# Patient Record
Sex: Female | Born: 1955
Health system: Southern US, Community
[De-identification: ages and names within clinical notes are randomized; demographics above are authoritative.]

## PROBLEM LIST (undated history)

## (undated) DIAGNOSIS — I10 Essential (primary) hypertension: Secondary | ICD-10-CM

## (undated) DIAGNOSIS — F419 Anxiety disorder, unspecified: Secondary | ICD-10-CM

## (undated) DIAGNOSIS — H269 Unspecified cataract: Secondary | ICD-10-CM

## (undated) DIAGNOSIS — IMO0001 Reserved for inherently not codable concepts without codable children: Secondary | ICD-10-CM

## (undated) DIAGNOSIS — K5190 Ulcerative colitis, unspecified, without complications: Secondary | ICD-10-CM

## (undated) DIAGNOSIS — R200 Anesthesia of skin: Secondary | ICD-10-CM

## (undated) DIAGNOSIS — R202 Paresthesia of skin: Secondary | ICD-10-CM

## (undated) DIAGNOSIS — T7840XA Allergy, unspecified, initial encounter: Secondary | ICD-10-CM

## (undated) DIAGNOSIS — E118 Type 2 diabetes mellitus with unspecified complications: Secondary | ICD-10-CM

## (undated) DIAGNOSIS — Z932 Ileostomy status: Secondary | ICD-10-CM

## (undated) DIAGNOSIS — K56609 Unspecified intestinal obstruction, unspecified as to partial versus complete obstruction: Secondary | ICD-10-CM

## (undated) DIAGNOSIS — N289 Disorder of kidney and ureter, unspecified: Secondary | ICD-10-CM

## (undated) DIAGNOSIS — I82409 Acute embolism and thrombosis of unspecified deep veins of unspecified lower extremity: Secondary | ICD-10-CM

## (undated) DIAGNOSIS — D649 Anemia, unspecified: Secondary | ICD-10-CM

## (undated) DIAGNOSIS — E785 Hyperlipidemia, unspecified: Secondary | ICD-10-CM

## (undated) DIAGNOSIS — M1990 Unspecified osteoarthritis, unspecified site: Secondary | ICD-10-CM

## (undated) DIAGNOSIS — Z5189 Encounter for other specified aftercare: Secondary | ICD-10-CM

## (undated) DIAGNOSIS — K635 Polyp of colon: Secondary | ICD-10-CM

## (undated) HISTORY — DX: Disorder of kidney and ureter, unspecified: N28.9

## (undated) HISTORY — DX: Unspecified cataract: H26.9

## (undated) HISTORY — PX: OTHER SURGICAL HISTORY: SHX169

## (undated) HISTORY — DX: Polyp of colon: K63.5

## (undated) HISTORY — DX: Anemia, unspecified: D64.9

## (undated) HISTORY — DX: Allergy, unspecified, initial encounter: T78.40XA

## (undated) HISTORY — DX: Hyperlipidemia, unspecified: E78.5

## (undated) HISTORY — PX: DILATION AND CURETTAGE OF UTERUS: SHX78

## (undated) HISTORY — DX: Ileostomy status: Z93.2

## (undated) HISTORY — PX: EYE SURGERY: SHX253

## (undated) HISTORY — DX: Essential (primary) hypertension: I10

## (undated) HISTORY — PX: BREAST CYST ASPIRATION: SHX578

## (undated) HISTORY — DX: Reserved for inherently not codable concepts without codable children: IMO0001

## (undated) HISTORY — PX: ABDOMINAL SURGERY: SHX537

## (undated) HISTORY — PX: COLON SURGERY: SHX602

## (undated) HISTORY — DX: Acute embolism and thrombosis of unspecified deep veins of unspecified lower extremity: I82.409

## (undated) HISTORY — PX: KNEE ARTHROSCOPY W/ MENISCAL REPAIR: SHX1877

## (undated) HISTORY — DX: Type 2 diabetes mellitus with unspecified complications: E11.8

## (undated) HISTORY — DX: Encounter for other specified aftercare: Z51.89

## (undated) HISTORY — DX: Unspecified osteoarthritis, unspecified site: M19.90

## (undated) HISTORY — DX: Other pulmonary embolism without acute cor pulmonale: I26.99

## (undated) HISTORY — DX: Unspecified intestinal obstruction, unspecified as to partial versus complete obstruction: K56.609

## (undated) HISTORY — DX: Ulcerative colitis, unspecified, without complications: K51.90

---

## 1994-02-06 ENCOUNTER — Encounter: Payer: Self-pay | Admitting: Internal Medicine

## 1998-11-09 HISTORY — PX: ABDOMINAL HYSTERECTOMY: SHX81

## 1999-02-18 ENCOUNTER — Other Ambulatory Visit: Admission: RE | Admit: 1999-02-18 | Discharge: 1999-02-18 | Payer: Self-pay | Admitting: Internal Medicine

## 2000-09-27 ENCOUNTER — Other Ambulatory Visit: Admission: RE | Admit: 2000-09-27 | Discharge: 2000-09-27 | Payer: Self-pay | Admitting: Obstetrics and Gynecology

## 2000-12-08 ENCOUNTER — Encounter (INDEPENDENT_AMBULATORY_CARE_PROVIDER_SITE_OTHER): Payer: Self-pay

## 2000-12-08 ENCOUNTER — Ambulatory Visit (HOSPITAL_COMMUNITY): Admission: RE | Admit: 2000-12-08 | Discharge: 2000-12-08 | Payer: Self-pay | Admitting: Obstetrics and Gynecology

## 2001-04-11 ENCOUNTER — Inpatient Hospital Stay (HOSPITAL_COMMUNITY): Admission: RE | Admit: 2001-04-11 | Discharge: 2001-04-14 | Payer: Self-pay | Admitting: Obstetrics and Gynecology

## 2001-04-11 ENCOUNTER — Encounter (INDEPENDENT_AMBULATORY_CARE_PROVIDER_SITE_OTHER): Payer: Self-pay | Admitting: Specialist

## 2001-09-05 ENCOUNTER — Encounter (INDEPENDENT_AMBULATORY_CARE_PROVIDER_SITE_OTHER): Payer: Self-pay

## 2001-09-05 ENCOUNTER — Other Ambulatory Visit: Admission: RE | Admit: 2001-09-05 | Discharge: 2001-09-05 | Payer: Self-pay | Admitting: Internal Medicine

## 2002-12-07 ENCOUNTER — Other Ambulatory Visit: Admission: RE | Admit: 2002-12-07 | Discharge: 2002-12-07 | Payer: Self-pay | Admitting: Obstetrics and Gynecology

## 2004-02-06 ENCOUNTER — Other Ambulatory Visit: Admission: RE | Admit: 2004-02-06 | Discharge: 2004-02-06 | Payer: Self-pay | Admitting: Obstetrics and Gynecology

## 2004-10-09 ENCOUNTER — Ambulatory Visit: Payer: Self-pay | Admitting: Internal Medicine

## 2004-10-13 ENCOUNTER — Encounter: Payer: Self-pay | Admitting: Internal Medicine

## 2004-10-13 ENCOUNTER — Ambulatory Visit (HOSPITAL_COMMUNITY): Admission: RE | Admit: 2004-10-13 | Discharge: 2004-10-13 | Payer: Self-pay | Admitting: Internal Medicine

## 2004-12-18 ENCOUNTER — Other Ambulatory Visit: Admission: RE | Admit: 2004-12-18 | Discharge: 2004-12-18 | Payer: Self-pay | Admitting: Family Medicine

## 2005-05-04 ENCOUNTER — Emergency Department: Payer: Self-pay | Admitting: Emergency Medicine

## 2005-05-04 ENCOUNTER — Other Ambulatory Visit: Payer: Self-pay

## 2006-04-08 ENCOUNTER — Ambulatory Visit: Payer: Self-pay | Admitting: Internal Medicine

## 2006-04-15 ENCOUNTER — Ambulatory Visit: Payer: Self-pay | Admitting: Internal Medicine

## 2006-05-06 ENCOUNTER — Ambulatory Visit (HOSPITAL_COMMUNITY): Admission: RE | Admit: 2006-05-06 | Discharge: 2006-05-06 | Payer: Self-pay | Admitting: Cardiology

## 2006-05-06 ENCOUNTER — Encounter: Payer: Self-pay | Admitting: Internal Medicine

## 2007-03-10 ENCOUNTER — Other Ambulatory Visit: Admission: RE | Admit: 2007-03-10 | Discharge: 2007-03-10 | Payer: Self-pay | Admitting: Family Medicine

## 2007-03-10 ENCOUNTER — Ambulatory Visit: Payer: Self-pay | Admitting: Family Medicine

## 2007-07-13 ENCOUNTER — Ambulatory Visit: Payer: Self-pay | Admitting: Internal Medicine

## 2007-08-08 ENCOUNTER — Ambulatory Visit: Payer: Self-pay | Admitting: Internal Medicine

## 2007-08-08 ENCOUNTER — Encounter: Payer: Self-pay | Admitting: Internal Medicine

## 2007-08-08 DIAGNOSIS — K5190 Ulcerative colitis, unspecified, without complications: Secondary | ICD-10-CM | POA: Insufficient documentation

## 2007-09-20 ENCOUNTER — Ambulatory Visit: Payer: Self-pay | Admitting: Internal Medicine

## 2007-09-21 ENCOUNTER — Encounter: Payer: Self-pay | Admitting: Internal Medicine

## 2007-12-30 DIAGNOSIS — I10 Essential (primary) hypertension: Secondary | ICD-10-CM

## 2008-06-05 ENCOUNTER — Encounter: Payer: Self-pay | Admitting: Internal Medicine

## 2008-08-01 ENCOUNTER — Other Ambulatory Visit: Admission: RE | Admit: 2008-08-01 | Discharge: 2008-08-01 | Payer: Self-pay | Admitting: Family Medicine

## 2008-08-01 ENCOUNTER — Encounter: Payer: Self-pay | Admitting: Family Medicine

## 2008-08-01 ENCOUNTER — Ambulatory Visit: Payer: Self-pay | Admitting: Family Medicine

## 2008-10-09 ENCOUNTER — Ambulatory Visit: Payer: Self-pay | Admitting: Family Medicine

## 2008-12-20 ENCOUNTER — Telehealth: Payer: Self-pay | Admitting: Internal Medicine

## 2008-12-24 ENCOUNTER — Encounter: Payer: Self-pay | Admitting: Internal Medicine

## 2008-12-25 ENCOUNTER — Encounter: Payer: Self-pay | Admitting: Internal Medicine

## 2009-01-16 ENCOUNTER — Ambulatory Visit: Payer: Self-pay | Admitting: Internal Medicine

## 2009-05-16 ENCOUNTER — Encounter: Payer: Self-pay | Admitting: Internal Medicine

## 2009-07-09 ENCOUNTER — Ambulatory Visit: Payer: Self-pay | Admitting: Family Medicine

## 2009-07-09 ENCOUNTER — Encounter: Payer: Self-pay | Admitting: Internal Medicine

## 2009-09-09 ENCOUNTER — Encounter: Payer: Self-pay | Admitting: Internal Medicine

## 2009-09-17 ENCOUNTER — Other Ambulatory Visit: Admission: RE | Admit: 2009-09-17 | Discharge: 2009-09-17 | Payer: Self-pay | Admitting: Family Medicine

## 2009-09-17 ENCOUNTER — Ambulatory Visit: Payer: Self-pay | Admitting: Family Medicine

## 2009-10-08 ENCOUNTER — Encounter: Payer: Self-pay | Admitting: Internal Medicine

## 2009-10-15 ENCOUNTER — Ambulatory Visit: Payer: Self-pay | Admitting: Internal Medicine

## 2009-10-15 DIAGNOSIS — K8020 Calculus of gallbladder without cholecystitis without obstruction: Secondary | ICD-10-CM | POA: Insufficient documentation

## 2009-10-23 ENCOUNTER — Ambulatory Visit: Payer: Self-pay | Admitting: Family Medicine

## 2009-10-30 ENCOUNTER — Ambulatory Visit (HOSPITAL_COMMUNITY): Admission: RE | Admit: 2009-10-30 | Discharge: 2009-10-30 | Payer: Self-pay | Admitting: Internal Medicine

## 2009-10-30 ENCOUNTER — Ambulatory Visit: Payer: Self-pay | Admitting: Internal Medicine

## 2009-10-30 LAB — HM COLONOSCOPY

## 2009-10-31 ENCOUNTER — Encounter: Payer: Self-pay | Admitting: Internal Medicine

## 2010-03-04 ENCOUNTER — Ambulatory Visit: Payer: Self-pay | Admitting: Physician Assistant

## 2010-03-07 ENCOUNTER — Ambulatory Visit: Payer: Self-pay | Admitting: Family Medicine

## 2010-03-17 ENCOUNTER — Ambulatory Visit: Payer: Self-pay | Admitting: Family Medicine

## 2010-06-16 ENCOUNTER — Ambulatory Visit: Payer: Self-pay | Admitting: Family Medicine

## 2010-06-18 ENCOUNTER — Encounter: Admission: RE | Admit: 2010-06-18 | Discharge: 2010-06-18 | Payer: Self-pay | Admitting: Family Medicine

## 2010-06-19 ENCOUNTER — Encounter: Admission: RE | Admit: 2010-06-19 | Discharge: 2010-06-19 | Payer: Self-pay | Admitting: Family Medicine

## 2010-10-31 ENCOUNTER — Telehealth: Payer: Self-pay | Admitting: Internal Medicine

## 2010-11-17 ENCOUNTER — Ambulatory Visit
Admission: RE | Admit: 2010-11-17 | Discharge: 2010-11-17 | Payer: Self-pay | Source: Home / Self Care | Attending: Internal Medicine | Admitting: Internal Medicine

## 2010-12-11 NOTE — Progress Notes (Signed)
Summary: Colitits flare up  Medications Added PREDNISONE 10 MG  TABS (PREDNISONE) Take 30 mg x 1 week, then 20 mg x 1 week then, 10 mg x 1 week LIALDA 1.2 GM TBEC (MESALAMINE) Take 2 by mouth daily       Phone Note Call from Patient Call back at [phone]   Call For: Dr Olevia Perches Reason for Call: Talk to Nurse Summary of Call: Having a colitis flare up and wonders if we can call in some prednizone to get her thru the hollidays. [facility]  [address] Initial call taken by: Irwin Brakeman [facility],  October 31, 2010 10:24 AM  Follow-up for Phone Call        Patient calling to report colitis flare that started 1-2 weeks ago. Patient states she is having diarrhea with bright red blood mixed in stool. She has had diarrhea 3 times today. She is experiencing urgency and cramping. Abdominal pain mainly cramping on the left lower abdomen. Patient is not taking Asacol. She restarted the Mercaptopurine  and Prednisone 30 mg yesterday. She wants an rx for Prednisone. Spoke with Dr. Sharlett Iles re: patient and above issues. Per Dr. Sharlett Iles, Prednisone 30 mg x 1 week, then 20 mg x 1 week then 10 mg x 1 week.  No Mercaptopurine. Lialda 2 tab/day. Schedule visit with Dr. Olevia Perches in 1-2 weeks.  Spoke with patient and she will pick up samples of Lialda. Rx sent to patient's pharmacy. Scheduled patient with Dr. Olevia Perches on 11/17/10 at 9 AM. Follow-up by: Leone Payor RN,  October 31, 2010 11:34 AM  Additional Follow-up for Phone Call Additional follow up Details #1::        Excellent decision. I agree.  Additional Follow-up by: Lafayette Dragon MD,  October 31, 2010 6:42 PM    New/Updated Medications: PREDNISONE 10 MG  TABS (PREDNISONE) Take 30 mg x 1 week, then 20 mg x 1 week then, 10 mg x 1 week LIALDA 1.2 GM TBEC (MESALAMINE) Take 2 by mouth daily Prescriptions: PREDNISONE 10 MG  TABS (PREDNISONE) Take 30 mg x 1 week, then 20 mg x 1 week then, 10 mg x 1 week  #50 x 0   Entered by:   Leone Payor RN  Authorized by:   Sable Feil MD [facility]   Signed by:   Leone Payor RN on 10/31/2010   Method used:   Electronically to        [id number] (retail)       [address]       [place], [place]  [address]       Ph: [phone] or [phone]       Fax: [phone]   RxID:   [id number]

## 2010-12-11 NOTE — Assessment & Plan Note (Signed)
Summary: colitis/Regina    History of Present Illness Visit Type: Follow-up Visit Primary GI MD: [name] MD Primary Provider: Jill Alexanders, MD Requesting Provider: Jill Alexanders, MD Chief Complaint: ulcerative colitis flare has improved, however cannot taper Prednisone without incident History of Present Illness:   This is a 55 year old African American female with ulcerative colitis since 1978. Her last colonoscopy in December 2010 showed a normal right colon with prominent minimally active left sided colitis to 60 cm. She had a flare up of colitis starting prior to Christmas and was started on prednisone 30 mg a day. She is currently down to 15 mg a day having soft stools, crampy abdominal pain and some urgency. She denies seeing blood. She has gained 20 pounds since last year. Patient is depressed because of her weight gain. She has to attend to her 51 year old mother and does not have time to exercise.   GI Review of Systems    Reports abdominal pain and  bloating.     Location of  Abdominal pain: upper abdomen.    Denies acid reflux, belching, chest pain, dysphagia with liquids, dysphagia with solids, heartburn, loss of appetite, nausea, vomiting, vomiting blood, weight loss, and  weight gain.        Denies anal fissure, black tarry stools, change in bowel habit, constipation, diarrhea, diverticulosis, fecal incontinence, heme positive stool, hemorrhoids, irritable bowel syndrome, jaundice, light color stool, liver problems, rectal bleeding, and  rectal pain.    Current Medications (verified): 1)  Prednisone 10 Mg  Tabs (Prednisone) .... Take 30 Mg X 1 Week, Then 20 Mg X 1 Week Then, 10 Mg X 1 Week 2)  Benazepril-Hydrochlorothiazide 20-12.5 Mg Tabs (Benazepril-Hydrochlorothiazide) .... One Tablet By Mouth Once Daily 3)  Fish Oil 1000 Mg Caps (Omega-3 Fatty Acids) .... 2 Tablets By Mouth Once Daily 4)  Alprazolam 0.25 Mg Tabs (Alprazolam) .... Take 1 Tablet By Mouth At Bedtime As  Needed 5)  Lialda 1.2 Gm Tbec (Mesalamine) .... Take 2 By Mouth Daily  Allergies (verified): No Known Drug Allergies  Past History:  Past Medical History: Reviewed history from 12/30/2007 and no changes required. Current Problems:  HYPERTENSION (ICD-401.9) COLITIS, ULCERATIVE (ICD-556.9)  Past Surgical History: Reviewed history from 11/13/2008 and no changes required. Hysterectomy D & C  Family History: Reviewed history from 01/16/2009 and no changes required. Family History of Colon Cancer: Aunt Family History of Diabetes: Father, Mother, Sister Family History of Breast Cancer:Mother  Social History: Reviewed history from 01/16/2009 and no changes required. Married Illicit Drug Use - no Patient has never smoked.  Alcohol Use - no  Review of Systems  The patient denies allergy/sinus, anemia, anxiety-new, arthritis/joint pain, back pain, blood in urine, breast changes/lumps, change in vision, confusion, cough, coughing up blood, depression-new, fainting, fatigue, fever, headaches-new, hearing problems, heart murmur, heart rhythm changes, itching, menstrual pain, muscle pains/cramps, night sweats, nosebleeds, pregnancy symptoms, shortness of breath, skin rash, sleeping problems, sore throat, swelling of feet/legs, swollen lymph glands, thirst - excessive , urination - excessive , urination changes/pain, urine leakage, vision changes, and voice change.         Pertinent positive and negative review of systems were noted in the above HPI. All other ROS was otherwise negative.   Vital Signs:  Patient profile:   55 year old female Height:      65 inches Weight:      171 pounds BMI:     28.56 Pulse rate:   76 / minute Pulse rhythm:  regular BP sitting:   140 / 74  (left arm) Cuff size:   regular  Vitals Entered By: June McMurray Madison Deborra Medina) (November 17, 2010 9:19 AM)  Physical Exam  General:  mildly cushingoid, alert and oriented. Eyes:  PERRLA, no icterus. Mouth:  No  deformity or lesions, dentition normal. Neck:  Supple; no masses or thyromegaly. Lungs:  Clear throughout to auscultation. Heart:  Regular rate and rhythm; no murmurs, rubs,  or bruits. Abdomen:  soft abdomen with minimal tenderness in left lower quadrant. No distention. Normal active bowel sounds. Liver edge at costal margin. Rectal:  soft trace Hemoccult-positive stool. Msk:  tenderness of hands and low back. Extremities:  no edema. Skin:  Intact without significant lesions or rashes. Psych:  appears depressed.   Impression & Recommendations:  Problem # 1:  COLITIS, ULCERATIVE (ICD-556.9) Patient has ulcerative colitis of 32 years duration. There was no evidence of dysplasia on her last colonoscopy one year ago. Her flareup has been only partially controlled. Patient did not take her Lialda. She will start today at 2.4 g daily. We will also put her on sulfasalazine  500 mg to take 2 twice a day after she finishes the Lialda which has not been covered by her insurance. She will continue a prednisone taper but will stay on 15 mg of prednisone for at least 2 weeks.  Patient Instructions: 1)  Please pick up your prescriptions at the pharmacy. Electronic prescription(s) has already been sent for Prednisone 10 mg and alprazolam. 2)  Begin sulfasalazine 500 mg 2 p.o. b.i.d. dispense 120. She should start after finishing Lialda samples. 3)  Start exercise program for weight loss. 4)  Please schedule a follow-up appointment in 6 to 8 weeks.  5)  Copy sent to : Dr Jill Alexanders 6)  The medication list was reviewed and reconciled.  All changed / newly prescribed medications were explained.  A complete medication list was provided to the patient / caregiver. Prescriptions: SULFASALAZINE 500 MG TABS (SULFASALAZINE) Take 2 tablets by mouth two times a day (take after finishing lialda samples)  #120 x 3   Entered by:   Madlyn Frankel CMA ([place])   Authorized by:   Lafayette Dragon MD   Signed by:    [place] ([place]) on 11/17/2010   Method used:   Electronically to        [id number] (retail)       [address]       [place], [place]  [address]       Ph: [phone] or [phone]       Fax: [phone]   RxID:   [id number] PREDNISONE 10 MG  TABS (PREDNISONE) Take as directed  #100 x 0   Entered by:   Madlyn Frankel CMA ([place])   Authorized by:   Lafayette Dragon MD   Signed by:   [place] ([place]) on 11/17/2010   Method used:   Electronically to        [id number] (retail)       [address]       [place], [place]  [address]       Ph: [phone] or [phone]       Fax: [phone]   RxID:   [id number] ALPRAZOLAM 0.25 MG TABS (ALPRAZOLAM) Take 1 tablet by mouth at bedtime as needed  #30 x 0   Entered by:   Madlyn Frankel CMA ([place])   Authorized by:  Lafayette Dragon MD   Signed by:   [place] ([place]) on 11/17/2010   Method used:   Printed then faxed to ...       [facility] [id number] ([facility]ail[facility]       [place]       [place], [place]  [address]       Ph: [phone] or [phone]       Fax: [phone]   RxID:   [id number]

## 2010-12-29 ENCOUNTER — Ambulatory Visit: Payer: Self-pay | Admitting: Internal Medicine

## 2011-02-12 ENCOUNTER — Ambulatory Visit (INDEPENDENT_AMBULATORY_CARE_PROVIDER_SITE_OTHER): Payer: 59 | Admitting: Family Medicine

## 2011-02-12 DIAGNOSIS — R42 Dizziness and giddiness: Secondary | ICD-10-CM

## 2011-02-12 DIAGNOSIS — D649 Anemia, unspecified: Secondary | ICD-10-CM

## 2011-02-12 DIAGNOSIS — I10 Essential (primary) hypertension: Secondary | ICD-10-CM

## 2011-02-12 DIAGNOSIS — Z79899 Other long term (current) drug therapy: Secondary | ICD-10-CM

## 2011-03-04 ENCOUNTER — Ambulatory Visit: Payer: 59 | Admitting: Family Medicine

## 2011-03-20 ENCOUNTER — Encounter: Payer: Self-pay | Admitting: Internal Medicine

## 2011-03-20 ENCOUNTER — Telehealth: Payer: Self-pay | Admitting: Internal Medicine

## 2011-03-20 ENCOUNTER — Ambulatory Visit (INDEPENDENT_AMBULATORY_CARE_PROVIDER_SITE_OTHER): Payer: 59 | Admitting: Nurse Practitioner

## 2011-03-20 ENCOUNTER — Encounter: Payer: Self-pay | Admitting: Nurse Practitioner

## 2011-03-20 VITALS — BP 120/80 | HR 72 | Ht 64.0 in | Wt 168.0 lb

## 2011-03-20 DIAGNOSIS — K5190 Ulcerative colitis, unspecified, without complications: Secondary | ICD-10-CM

## 2011-03-20 NOTE — Patient Instructions (Addendum)
We called the pharmacy [facility] in [place]. Prednisone 10 mg, take 1 tab daily. # 30 with 3 RF.  Spoke to pharmacist.

## 2011-03-20 NOTE — Telephone Encounter (Signed)
agree

## 2011-03-20 NOTE — Telephone Encounter (Signed)
Patient calling to report ulcerative colitis flare. States the end of April when she returned from a trip, she started having problems. Patient states she started herself on Prednisone that she had. She states when she tapers the Prednisone symptoms flare up. She is taking Asacol. States she is having bloody, diarrhea. Wants to be seen today. Scheduled patient with Tye Savoy, NP today at 3:30 PM

## 2011-03-20 NOTE — Progress Notes (Signed)
Latasha Wood [id number] 03-Mar-1956   History of Present Illness:   Ms. Latasha Wood is a 55 year old female followed by Dr. Olevia Perches for ulcerative colitis. Patient was last seen in January of this year. She has been on sulfasalazine 500 mg 2 twice a day. Patient was on prednisone and in the process of tapering the medication. Plan was for her to stay on 20 mg prednisone for 2 additional weeks and then continue tapering. Patient called in this morning to report and Ultracet colitis flare which began the end of last month. Patient restarted Prednisone 64m on 4/28 but is still having rectal bleeding and overall doesn't feel wel.  Patient's last colonoscopy in December 2010 showed minimally active left-sided colitis to 60 cm.      Current Medications, Allergies, Past Medical History, Past Surgical History, Family History and Social History were reviewed in [employer].   Physical Exam: General: Well developed  Female in no acute distress Head: Normocephalic and atraumatic Eyes:  sclerae anicteric,conjunctive pink. Ears: Normal auditory acuity Mouth: No deformity or lesions Neck: Supple, no masses.  Lungs: Clear throughout to auscultation Heart: Regular rate and rhythm; no murmurs heard Abdomen: Soft, nontender, nondistended. No masses or hepatomegaly noted. Normal bowel sounds Rectal: Scant light brown stool with fleck of blood, heme positive. Musculoskeletal: Symmetrical with no gross deformities  Skin: No lesions on visible extremities Extremities: No edema or deformities noted Neurological: Alert oriented x 4, grossly nonfocal Cervical Nodes:  No significant cervical adenopathy Psychological:  Alert and cooperative. Normal mood and affect  Assessment and Plan:  COLITIS, ULCERATIVE Longstanding ulcerative colitis. Patient unable to get off Prednisone without having a flare. She is taking Sulfasalazine 10052mbid and has been back on Prednisone 2012mor  approximately 12 days now and still having rectal bleeding. Overall she doesn't feel well. Her last colonoscopy was in Dec.2010 with findings of mild left sided colitis. No recent antibiotics to suggest superimposed infectious colitis. She isn't really having diarrhea, stools more of "mushy" consistency. To further evaluate what is going on and whether patient needs changes / additions to her maintenance medications, she will be scheduled for flexible sigmoidoscopy with Dr. BroOlevia Percheshe risks, benefits, and alternatives to flexible sigmoidoscopy with possible biopsy and possible polypectomy were discussed with the patient and she consents to proceed.

## 2011-03-22 NOTE — Assessment & Plan Note (Signed)
Longstanding ulcerative colitis. Patient unable to get off Prednisone without having a flare. She is taking Sulfasalazine 1079m bid and has been back on Prednisone 272mfor approximately 12 days now and still having rectal bleeding. Overall she doesn't feel well. Her last colonoscopy was in Dec.2010 with findings of mild left sided colitis. No recent antibiotics to suggest superimposed infectious colitis. She isn't really having diarrhea, stools more of "mushy" consistency. To further evaluate what is going on and whether patient needs changes / additions to her maintenance medications, she will be scheduled for flexible sigmoidoscopy with Dr. BrOlevia PerchesThe risks, benefits, and alternatives to flexible sigmoidoscopy with possible biopsy and possible polypectomy were discussed with the patient and she consents to proceed.

## 2011-03-23 NOTE — Progress Notes (Signed)
i agree with plan outlined in this note.  May need to consider azathiaprine or 53m if cannot come off of steroids and no other causes noted.

## 2011-03-24 ENCOUNTER — Encounter: Payer: Self-pay | Admitting: Internal Medicine

## 2011-03-24 ENCOUNTER — Ambulatory Visit (INDEPENDENT_AMBULATORY_CARE_PROVIDER_SITE_OTHER): Payer: 59 | Admitting: Internal Medicine

## 2011-03-24 DIAGNOSIS — F419 Anxiety disorder, unspecified: Secondary | ICD-10-CM | POA: Insufficient documentation

## 2011-03-24 DIAGNOSIS — I10 Essential (primary) hypertension: Secondary | ICD-10-CM

## 2011-03-24 DIAGNOSIS — K5190 Ulcerative colitis, unspecified, without complications: Secondary | ICD-10-CM

## 2011-03-24 DIAGNOSIS — F411 Generalized anxiety disorder: Secondary | ICD-10-CM

## 2011-03-24 NOTE — Assessment & Plan Note (Signed)
[facility] OFFICE NOTE   NAME:Karow, ADILYNNE FITZWATER                  MRN:          [id number]  DATE:07/13/2007                            DOB:          1956-04-27    Latasha Wood is a 55 year old female with a history of ulcerative  proctitis/colitis since 29.  She is status post total abdominal  hysterectomy.  Most recent flare up of her colitis occurred about 6  weeks ago and responded to tapering dose of prednisone which started at  30 mg daily for 2 weeks with subsequent decrease by 5 mg every 2 weeks.  She is currently at 15 mg a day and asymptomatic.  Other medications  include 6-mercaptopurine 50 mg a day and Asacol 400 mg 1.2 g 3 times a  day.  She denies any rectal bleeding, diarrhea, or abdominal pain for  the past several weeks.   PHYSICAL EXAMINATION:  Blood pressure 94/62, pulse 64, weight 148  pounds.  She was alert and oriented in no distress.  She did not appear to be  cushingoid.  LUNGS:  Clear to auscultation.  COR:  Normal S1, normal S2.  ABDOMEN:  Soft, nontender with normoactive bowel sounds.  RECTAL EXAM:  Not done.   IMPRESSION:  55. A 55 year old female with ulcerative colitis/proctitis, now in      remission on decreasing dose of prednisone, and continued      immunomodulator as well as mesalamine.  2. The patient is due for colonoscopy, last one October 2005.  She has      been having colonoscopy every 3 years to rule out dysplasia.   PLAN:  For a CBC, sed rate, and iron studies.  She works for [employer]  and will have her blood test drawn at work.     Latasha Wood. Olevia Perches, MD  Electronically Signed    DMB/MedQ  DD: 07/13/2007  DT: 07/13/2007  Job #: [id number]   cc:   Jill Alexanders, M.D.

## 2011-03-24 NOTE — Assessment & Plan Note (Signed)
[facility] OFFICE NOTE   NAME:Latasha Wood, Latasha Wood                  MRN:          [id number]  DATE:09/20/2007                            DOB:          [birth year]    Latasha Wood returns for followup for flare up of her colitis.  Colonoscopy  on August 08, 2007 showed active colitis, only from rectal sigmoid  colon from zero to 15 cm.  Random biopsies in the sigmoid colon,  descending colon, transverse colon, and right colon show no active  disease.  There was no dysplasia.  The patient has clinically done well  on prednisone.  She is down to 20 mg a day together with Asacol 3.2 g a  day.  She tends to forget to take her medications.  She is also on 6-  mercaptopurine 50 mg a day.  She has been very active playing tennis and  competing.  Physically she has been feeling well.  The patient has not  taken her Canasa suppositories because she forgets.  She denies any  rectal bleeding, she has no diarrhea.   PHYSICAL EXAMINATION:  Blood pressure 124/80, pulse 66 and weight 151  pounds.  She was alert and oriented in no distress.  LUNGS:  Clear to auscultation.  COR:  Normal S1 and S2.  ABDOMEN:  Soft with normoactive bowel sounds.  No distension, no  tenderness.  Rectal exam today shows normal perineal area.  Small amount of Hemoccult-  negative stool in the rectal ampulla.   IMPRESSION:  A 55 year old African-American female with long-standing  ulcerative colitis since 1978, currently going into remission after her  most recent flare up.  A recent colonoscopy shows no evidence of  dysplasia.   PLAN:  1. Switch to Lialda from Asacol because of better compliance.  The      patient seems to forget to take her medications during the day.  We      will switch her to 1.2 g 3 tablets once a day, and I gave her a      prescription for 3 months mail order.  2. Decrease prednisone by 5 mg every 2 weeks.  She should be  off      prednisone by the first week in January.  3. Continue 6-mercaptopurine at 50 mg a day.  Her Prom[employer] profile      for 6-MP metabolites was drawn yesterday.  Her hemoglobin recently      at [facility] was 1.7 with hematocrit of 32.8 which is lower than her      usual hemoglobin of 12.5.  I asked her to take iron, 1 a day, for      the next several weeks to build up her blood count.  Her sed rate      was normal.     Latasha Wood. Olevia Perches, MD  Electronically Signed    DMB/MedQ  DD: 09/20/2007  DT: 09/20/2007  Job #: [id number]   cc:   Jill Alexanders, M.D.

## 2011-03-24 NOTE — Progress Notes (Signed)
Subjective:    Patient ID: Latasha Wood, female    DOB: 03-22-1956, 55 y.o.   MRN: [id number]  HPI Latasha Wood presents to establish for on-going continuity care. She reports that her BP has been variable but it has been high at home usually 140/90's but often 160s/100's. No epistaxis, no diploplia, no crushing headache or other neurologic symptoms.  She reports that she has a sense of sinus congestion and pressure. She has had mild "swimmy headed" feeling. No fevers, no chills. She is not taking any medications for this.  She is under a lot of stress at work: feels like a round peg in a square hole. 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 She is the primary care-giver for her aging mother. She has a long term marriage (30+ years) and she feels that she is not meeting all his needs. There are arguments about the job. She becomes a little tearful as we discuss these issues.  Past Medical History  Diagnosis Date  . Hypertension   . Colitis, ulcerative   . Hyperlipidemia   . Colon polyps    Past Surgical History  Procedure Date  . Gravida 6 para 2     all SAB  . Hyadiform mole   . Dilation and curettage of uterus     most likely after miscarriage  . Abdominal hysterectomy   . Knee arthroscopy w/ meniscal repair     right knee ([place])  . Fibrocystic breast disease     q 6 month mammogram   Family History  Problem Relation Age of Onset  . Diabetes Mother   . Cancer Mother     breast  . Diabetes Father   . Heart disease Father   . Diabetes Sister   . Cancer Other     colon  . Diabetes Brother   . Cancer Sister 78    breast cancer   History   Social History  . Marital Status: Married    Spouse Name: N/A    Number of Children: 68  . Years of Education: 59   Occupational History  . customer relations     [employer]   Social History Main Topics  . Smoking status: Never Smoker   .  Smokeless tobacco: Never Used  . Alcohol Use: 1.0 oz/week    2 drink(s) per week  . Drug Use: No  . Sexually Active: Yes -- Female partner(s)   Other Topics Concern  . Not on file   Social History Narrative   HSG, [employer] - BA, [employer] for med tech.  Married '75. 2 dtrs - '79, '82; 1 grand on the way. Marriage is in good health. No abuse issues.    Current Outpatient Prescriptions on File Prior to Visit  Medication Sig Dispense Refill  . ALPRAZolam (XANAX) 0.25 MG tablet Take 0.25 mg by mouth at bedtime as needed.        . benazepril-hydrochlorthiazide (LOTENSIN HCT) 20-12.5 MG per tablet Take 1 tablet by mouth daily.        . fish oil-omega-3 fatty acids 1000 MG capsule Take 2 g by mouth daily.        . predniSONE (DELTASONE) 10 MG tablet Take 10 mg by mouth as directed.       . sulfaSALAzine (AZULFIDINE) 500 MG tablet Take 1,000 mg by mouth 2 (two) times daily.  Review of Systems Review of Systems  Constitutional:  Negative for fever, chills, activity change and unexpected weight change.  HENT:  Negative for hearing loss, ear pain, congestion, neck stiffness and postnasal drip.   Eyes: Negative for pain, discharge and visual disturbance.  Respiratory: Negative for chest tightness and wheezing.   Cardiovascular: Negative for chest pain and palpitations.       [No decreased exercise tolerance Gastrointestinal: [No change in bowel habit. No bloating or gas. No reflux or indigestion Genitourinary: Negative for urgency, frequency, flank pain and difficulty urinating.  Musculoskeletal: Negative for myalgias, back pain, arthralgias and gait problem.  Neurological: Negative for dizziness, tremors, weakness and headaches.  Hematological: Negative for adenopathy.  Psychiatric/Behavioral: Negative for behavioral problems and dysphoric mood.   Reviewed labs from [facility]: Cmet nl with glucose of 104; Lipids: HDL 129!! LDL 196!! Tgy 58; TSH 0.29 FT4  1.33      Objective:   Physical Exam Vitals reviewed. Well nourished well developed AA woman in no distress physically HEENT - normal head. Ears clear, TMs normal, oropharynx without lesions. Neck - supple. Thyroid at upper limits of normal size. No nodules or other palpable abnormalities Nodes - no submandibular or cervical nodes Lungs- clear to auscultation Cor - RRR Abd- Soft Extremities - no deformity noted Derm - no facial, arm or leg lesions. Neuro - A&O x 3, CN II-XII intact, DTRs 2+ and equal Psych- thought content normal, emotionally fragil        Assessment & Plan:  1. BP - good control today. She will forward her home readings. Will discuss change in medication as indicated at return visit.  2. Hyperlipidemia - very high HDL -protective. LDL is also very high and in the treatment range as an independent risk factor  Plan - will discuss statin therapy at her return visit.  3. Anxiety - discussed the many factors that may contribute to her ill feeling. Discussed the value of both counseling and medications.  Plan - she will think about her situation and will discuss restarting therapy and possibly starting medication at her return visit.   4. Ulcerativcolitis - having a flare and is under the care of Dr. Olevia Perches - presently on steroids.   5. Health maintenance - she is current with mammography. She is not in need of pelvic/pap. She had colonoscopy in '10. Will discuss immunizations at her return visit.

## 2011-03-27 NOTE — Op Note (Signed)
[facility] of [facility]  Patient:    Latasha Wood, Latasha Wood                  MRN: [id number] Proc. Date: 04/11/01 Adm. Date:  [id number] Attending:  Kendal Hymen                           Operative Report  PREOPERATIVE DIAGNOSES:       1. Fibroid uterus.                               2. Endometrial hyperplasia.                               3. Abnormal uterine bleeding.  POSTOPERATIVE DIAGNOSES:      1. Fibroid uterus.                               2. Endometrial hyperplasia.                               3. Abnormal uterine bleeding.                               4. Pathology pending.  PROCEDURE:                    Total abdominal hysterectomy.  SURGEON:                      Gerda Diss. Zigmund Daniel, M.D.  ASSISTANT:                    Dian Queen, M.D.  ANESTHESIA:                   General.  ESTIMATED BLOOD LOSS:         800 cc.  COMPLICATIONS:                None.  PACKS AND DRAINS:             Foley.  COUNTS:                       Sponge, needle, and instrument count reported as correct x 2.  DESCRIPTION OF PROCEDURE:     The patient was taken to the operating room and placed in the dorsal supine position. After adequate general anesthesia was administered, she was prepped and draped in the usual manner for abdominal surgery. Vagina was prepped as well. Foley catheter was placed. A Pfannenstiel incision was made down to the level of the fascia. The fascia was nicked with a knife and incised transversely with Mayo scissors. The underlying rectus muscles were separated from the fascia using sharp and blunt dissection. The rectus muscles were separated in the midline exposing the peritoneum which was elevated with hemostats and entered atraumatically with Metzenbaum scissors. The incision was extended longitudinally. Immediately, multinodular uterus, perhaps [redacted] weeks gestational size, was encountered. Ovaries appeared normal bilaterally. The  fallopian tubes appeared normally bilaterally as well. The peritoneal surfaces were smooth and glistening. The upper abdominal exploration was performed. The kidneys were present bilaterally. The liver surface  was smooth. There was no periaortic adenopathy. Balfour retractor was placed. The upper abdomen was packed off.  Attention was turned to the hysterectomy. The anatomy appeared somewhat distorted due to the huge fibroid uterus. The left round ligament appeared to be deviated more towards the midline. The right round ligament was essentially felt nonexistent. The case was approached by first clamping, cutting, and securing the left round ligament. A bladder flap was created by incising the anterior uterine serosa sharply and bluntly dissecting the bladder inferiorly. The broad ligament was further opened on the left side and the ureter felt way below the plane of dissection. The left fallopian tube, utero-ovarian ligament, and utero-ovarian anastomosis was isolated, clamped, cut, and doubly secured with #1 chromic catgut. Next, attention was turned to the right aspect of the uterus. The right broad ligament was opened, despite the fact that the round ligament could not be identified. The right fallopian tube, right utero-ovarian ligament, and right utero-ovarian anastomosis was isolated, clamped, cut, and doubly secured with #1 chromic catgut. The broad ligament was opened as far as was feasible. However, the isthmus could not be clearly identified due to the presence of large fibroids in the lower uterine segment, and possibly even near the cervix. There was one noted to be anterior beneath the bladder flap. One or two small bites of parametrium was taken immediately adjacent to the uterus on either side in order to get down closer to the isthmus in order to get the uterine vessels. Each pedicle was clamped, cut, and secured with #1 chromic catgut. With maximum traction on the  uterus, careful palpation again revealed no clear isthmus; hence, the decision was made to proceed with limited myomectomy in order to restore enough normal anatomy to be able to identify the cervix and isthmus and thus, complete the hysterectomy. A large fibroid was removed anteriorly in the area of the lower uterine segment to upper cervix. This was accomplished by sharp and blunt dissection. In so doing, the anatomy was sufficiently restored. The isthmus was identified. The uterine vessels were thus bilaterally clamped, cut, and secured with #1 chromic catgut. A couple additional bites were taken inferior to the isthmus in order to divided the cardinal ligaments. Each pedicle was clamped, cut, and secured with #1 chromic catgut. At this point, the cervix was entered with a curved Heaney and the remainder of the uterus and cervix excised with Satinsky scissors. Richardson angle sutures were placed with #1 chromic catgut. Next, a 2-0 Vicryl running locking circumferential suture was placed in the vagina cuff to aid in hemostasis. Hemostasis was noted. A single interrupted suture was placed in the cervix in the midline in order to close the space somewhat to avoid any possible hernia formation. At this point, all pedicles were inspected. Copious irrigation was accomplished. Prior to the irrigation, the blood loss was felt to be close to 800 cc. After the irrigation was accomplished, hemostasis was once again noted.  At this point, all packs were removed. Colon Flattery was removed. The fascia was closed with 0 Vicryl in a running fashion. The skin was closed with staples. At the conclusion of the procedure, the urine was clear and copious.  The patient was then taken to the recovery room in excellent condition. DD:  04/13/01 TD:  04/13/01 Job: [id number]

## 2011-03-27 NOTE — Discharge Summary (Signed)
[facility] of [facility]  Patient:    Latasha Wood, Latasha Wood                  MRN: [id number] Adm. Date:  [id number] Disc. Date: [id number] Attending:  Kendal Hymen                           Discharge Summary  DISCHARGE DIAGNOSES:          1. Fibroid uterus.                               2. Endometrial hyperplasia.  CONSULTATIONS:                None.  DISCHARGE MEDICATIONS:        1. Percocet p.r.n.                               2. Chromagen one q.d.  HISTORY AND PHYSICAL:         This is a 55 year old gravida 5, para 2-0-3-2 with a known huge fibroid uterus.  Patient experienced polymenorrhea over the last year.  Subsequent sonohysterogram revealed numerous fibroids where as the patient was felt to be possibly improved by D&C, hysteroscopy.  The D&C revealed simple hyperplasia without atypia.  She subsequently was maintained on Megace.  She desired definitive management in the form of hysterectomy. PAST MEDICAL HISTORY:         Please see chart.  HOSPITAL COURSE:              Patient was admitted to [place] on April 11, 2001.  She was taken to the operating room on that date and underwent total abdominal hysterectomy.  Procedure was uncomplicated. Hospital course was unremarkable.  Patient was discharged home on the second postoperative day afebrile and in satisfactory condition.  CLINICAL FINDINGS:            Hemoglobin and hematocrit on admission revealed values of 10.3 and 3.1 respectively.  Repeat values obtained April 14, 2001 revealed respective values of 7.3 and 21.2 respectively.  The patient did not require transfusion.  Routine chemistries were within normal limits.  Serum pregnancy test was negative.  Type and Rh revealed B+ blood.  Pathology revealed inactive endometrium with leiomyomata.  Uterine weight was 982 g.  DISPOSITION:                  Patient was discharged home and is to return to the office in four to six  weeks for postoperative evaluation. DD:  05/02/01 TD:  05/02/01 Job: [id number]

## 2011-03-27 NOTE — Op Note (Signed)
[facility] of [facility]  Patient:    Latasha Wood, Latasha Wood                  MRN: [id number] Proc. Date: 12/08/00 Adm. Date:  [id number] Attending:  Kendal Hymen                           Operative Report  PREOPERATIVE DIAGNOSES:       1. Echogenic focus in the fundal aspect of the                                  uterus at sonohysterogram.                               2. Abnormal uterine bleeding possibly secondary                                  to #1.  POSTOPERATIVE DIAGNOSES:      1. Echogenic focus in the fundal aspect of the                                  uterus at sonohysterogram.                               2. Abnormal uterine bleeding possibly secondary                                  to #1.                               3. Pathology pending.  PROCEDURE:                    1. Diagnostic/operative hysteroscopy.                               2. Dilatation and curettage.  SURGEON:                      Gerda Diss. Zigmund Daniel, M.D.  ANESTHESIA:                   General.  ESTIMATED BLOOD LOSS:         Less than 50 cc.  COMPLICATIONS:                None.  PACKS AND DRAINS:             None.  PROCEDURE:                    Patient was taken to the operating room and placed in the dorsal supine position.  After general anesthesia was administered she was placed in the lithotomy position and prepped and draped in the usual manner for vaginal surgery.  Posterior weighted retractor was placed per vagina.  The anterior lip of the cervix was grasped with a single tooth tenaculum.  The uterus was gently sounded to approximately 10  cm and noted to be anteverted.  The cervix was then dilated to a size 29 [place] using [employer] dilators.  The hysteroscope was then placed into the uterine cavity using sorbitol as a distention medium.  The uterine cavity was carefully surveyed.  The left tubal ostia was identified beneath the shelf of endometrium.  The right  tubal ostia was not as clearly delineated, but appeared to be pressed beneath a shelf of endometrium on the right aspect of the uterus.  Below this area there was a single soft tissue projection consistent with either a polyp or fibroid on the right fundal posterior aspect of the uterine cavity.  The remainder of the uterine cavity was surveyed and appeared to be normal.  Appropriate photos were obtained.  Using the resectoscope with a cutting ______ form of 80 watts and the coagulation ______ form 70 watts, the afore mentioned soft tissue projection was excised and submitted separately to pathology for histologic studies.  Coagulation weight form was used to obtain hemostasis.                                Next, attention was turned to the curettage.  A medium sized curette was used to perform a uterine curettage.  ______ four quadrant technique was used.  Then a therapeutic technique was used.  The resectoscope was once again placed and any small bleeders were controlled with the coagulation ______ form.  No additional findings were noted.  Appropriate photos were obtained.  At this point the vaginal instruments were removed. Hemostasis was noted and the procedure terminated.                                The patient tolerated the procedure extremely well and was returned to the recovery room in good condition. DD:  12/08/00 TD:  12/08/00 Job: [id number]

## 2011-03-27 NOTE — H&P (Signed)
[place]  Patient:    Latasha Wood, Latasha Wood                    MRN: [id number] Adm. Date:  12/08/00 Attending:  Jeneen Rinks A. Zigmund Daniel, M.D.                         History and Physical  HISTORY OF PRESENT ILLNESS:   This is a 55 year old, gravida 5, para 2-0-3-2 who presented November 19 with polymenorrhea and intermenstrual bleeding. She has a known diagnosis of fibroid uterus as well as other medical conditions including ulcerative colitis. Ultrasound on October 22, 2000 revealed uterine dimensions to be 13.6 x 10.5 x 11.0 cm. There were multiple myomas present including one which is 4.3 cm in greatest diameter. The sonohysterogram revealed an echogenic focus in the fundal region of the uterus. The patient thus for diagnostic laparoscopy and hysteroscopy and dilatation and curettage.  MEDICATIONS:                  Prednisone 15 mg daily, Asacol, ______.  PAST MEDICAL HISTORY:         Ulcerative colitis.  PAST SURGICAL HISTORY:        Laparoscopy 1993 per Dr. Warnell Forester with a complications of a bleeding vessel in the area of the right uterosacral ligament which necessitated exploratory laparotomy. The patient also has had a D&C and a LEEP.  ALLERGIES:                    None.  FAMILY HISTORY:               Positive for colon cancer and diabetes.  SOCIAL HISTORY:               The patient denies the use of tobacco or significant alcohol.  REVIEW OF SYSTEMS:            Noncontributory.  PHYSICAL EXAMINATION:  GENERAL:                      Well-developed, well-nourished, pleasant, black female in no acute distress.  VITAL SIGNS:                  Afebrile, vital signs stable.  SKIN:                         Warm and dry without lesions.  LYMPH:                        There is no supraclavicular, cervical, or inguinal adenopathy.  HEENT:                        Normocephalic.  NECK:                         Supple without thyromegaly.  CHEST:                         Lungs are clear.  CARDIAC:                      Regular rate and rhythm without murmur, gallop or rub.  BREASTS:                      Deferred.  ABDOMEN:                      Soft and nontender without masses or organomegaly. Bowel sounds are active.  MUSCULOSKELETAL:              Reveals full range of motion without edema, cyanosis or CVA tenderness.  PELVIC:                       Deferred until examination under anesthesia.  IMPRESSION:                   1. Fibroid uterus.                               2. Echogenic focus in the fundal aspect of the                                  uterus at sonohysterogram.                               3. Abnormal uterine bleeding--possibly                                  secondary to #1 and/or #2.                               4. Ulcerative colitis.  PLAN:                         1. Diagnostic/operative hysteroscopy.                               2. Dilatation and curettage.  The risks, benefits, complications, and alternatives were discussed with the patient. She states she understands and accepts. Questions provided and answered. DD:  12/07/00 TD:  12/07/00 Job: [id number]

## 2011-03-27 NOTE — H&P (Signed)
[place]  Patient:    Latasha Wood, Latasha Wood                    MRN: [id number] Adm. Date:  04/11/01 Attending:  Jeneen Rinks A. Zigmund Daniel, M.D.                         History and Physical  HISTORY OF PRESENT ILLNESS:   This is a 55 year old gravida 5, para 2-0-3-2 with a known huge fibroid uterus.  She has had polymenorrhea over the last year.  She returned for sonohysterogram, December 2001.  The sonohysterogram revealed numerous fibroids for which the patient was felt to possibly be improved by D&C/hysteroscopy, hence she was taken to the operating room and underwent the same on December 08, 2000.  The D&C revealed simple hyperplasia without atypia.  She has been maintained on Megace.  Patient states a desire for definitive management in the form of total abdominal hysterectomy.  Her husband has had a vasectomy.  MEDICATIONS:                  Prednisone 15 mg per day, Asacol, 6-mercaptopurine.  PAST MEDICAL HISTORY:         Medical:  Ulcerative colitis.  PAST SURGICAL HISTORY:        Exploratory laparotomy, diagnostic laparoscopy, dilatation and curettage, loop electrical excision procedure.  ALLERGIES:                    None.  FAMILY HISTORY:               Positive for colon cancer and diabetes.  SOCIAL HISTORY:               Patient denies the use of tobacco.  She does use alcohol socially.  REVIEW OF SYSTEMS:            Noncontributory.  PHYSICAL EXAMINATION:  GENERAL:                      Well-developed, well-nourished black female in no acute distress.  Afebrile.  VITAL SIGNS:                  Vital signs stable.  SKIN:                         Warm and dry without lesions.  LYMPH:                        There is no supraclavicular, cervical or inguinal adenopathy.  HEENT:                        Normocephalic.  NECK:                         Supple without thyromegaly.  CHEST:                        Lungs are clear.  CARDIAC:                       Regular rate and rhythm without murmurs, gallops or rubs.  BREASTS:                      Exam deferred.  ABDOMEN:  Soft, nontender, without masses or organomegaly. Bowel sounds are active.  No evidence of hernia.  MUSCULOSKELETAL:              Full range of motion, without edema, cyanosis or CVA tenderness.  PELVIC:                       Deferred until examination under anesthesia.  IMPRESSION:                   1. Huge fibroid uterus.                               2. Endometrial hyperplasia.                               3. Ulcerative colitis.  PLAN:                         Total abdominal hysterectomy.  Risks, benefits, complications and alternatives were fully discussed with the patient, the possible need for unilateral or bilateral salpingo-oophorectomy discussed and accepted.  In the latter event, patient understands the potential need to take hormone replacement therapy for the indefinite future.  Questions invited and answered. DD:  04/11/01 TD:  04/11/01 Job: [id number]

## 2011-04-01 ENCOUNTER — Telehealth: Payer: Self-pay | Admitting: Internal Medicine

## 2011-04-01 ENCOUNTER — Telehealth: Payer: Self-pay | Admitting: *Deleted

## 2011-04-01 NOTE — Telephone Encounter (Signed)
I called the pt about her Flex Sig instructions she misplaced.  She asked me to fax them to [phone] attention Harrie Foreman.  I faxed it today 5-23 @ 10:15 Am.

## 2011-04-01 NOTE — Telephone Encounter (Signed)
Pam faxed instructions to patient.

## 2011-04-02 ENCOUNTER — Encounter: Payer: Self-pay | Admitting: Internal Medicine

## 2011-04-03 ENCOUNTER — Encounter: Payer: Self-pay | Admitting: Internal Medicine

## 2011-04-03 ENCOUNTER — Encounter: Payer: Self-pay | Admitting: *Deleted

## 2011-04-03 ENCOUNTER — Other Ambulatory Visit: Payer: Self-pay | Admitting: *Deleted

## 2011-04-03 ENCOUNTER — Ambulatory Visit (AMBULATORY_SURGERY_CENTER): Payer: 59 | Admitting: Internal Medicine

## 2011-04-03 VITALS — BP 129/87 | HR 72 | Temp 98.6°F | Resp 18 | Ht 64.0 in | Wt 166.0 lb

## 2011-04-03 DIAGNOSIS — K6289 Other specified diseases of anus and rectum: Secondary | ICD-10-CM

## 2011-04-03 DIAGNOSIS — K5190 Ulcerative colitis, unspecified, without complications: Secondary | ICD-10-CM

## 2011-04-03 DIAGNOSIS — K625 Hemorrhage of anus and rectum: Secondary | ICD-10-CM

## 2011-04-03 DIAGNOSIS — K529 Noninfective gastroenteritis and colitis, unspecified: Secondary | ICD-10-CM

## 2011-04-03 DIAGNOSIS — K5150 Left sided colitis without complications: Secondary | ICD-10-CM

## 2011-04-03 MED ORDER — MESALAMINE 1.2 G PO TBEC: 4.8000 mg | DELAYED_RELEASE_TABLET | Freq: Every day | ORAL | Status: DC

## 2011-04-03 MED ORDER — HYDROCORTISONE 100 MG/60ML RE ENEM: 100.0000 mg | ENEMA | Freq: Every day | RECTAL | Status: DC

## 2011-04-03 MED ORDER — SODIUM CHLORIDE 0.9 % IV SOLN: 500.0000 mL | INTRAVENOUS | Status: DC

## 2011-04-03 MED ORDER — MESALAMINE 1000 MG RE SUPP: 1000.0000 mg | RECTAL | Status: DC

## 2011-04-03 MED ORDER — MERCAPTOPURINE 50 MG PO TABS: 50.0000 mg | ORAL_TABLET | Freq: Every day | ORAL | Status: DC

## 2011-04-03 NOTE — Patient Instructions (Addendum)
PLEASE FOLLOW DISCHARGE INSTRUCTIONS GIVEN TODAY. FOLLOW CHANGES IN MEDICATIONS, CANASA SUPP EVERY MORNING, CORT ENEMA 1 AT NIGHT, 6 MP 50 MG DAILY, PREDNISONE 30MG DAILY FOR 2 WEEKS, MESALAMINE 4.8 GM/DAILY. NEED OFFICE VISIT FOR 4 WEEKS,CALL OFFICE TO MAKE APPOINTMENT. CALL us WITH ANY QUESTIONS OR CONCERNS. WE WILL DO A FOLLOW UP CALL ON Tuesday MORNING.

## 2011-04-03 NOTE — Telephone Encounter (Signed)
Per Dr Olevia Perches, she would like patient to have Canasa suppositories, 1 per rectum every morning #30 with 3 refills and 6 mp 50 mg, 1 po qd #30 with 3 refills. Since canasa was already sent to pharmacy, I have asked that they only give 2 additional refills of the medication as per Dr Olevia Perches (it was sent originally with 12 refills). I have also asked that the 36m be given with 3 refills. Patient also needs CBC in 4 weeks and an office visit.

## 2011-04-07 ENCOUNTER — Telehealth: Payer: Self-pay | Admitting: *Deleted

## 2011-04-07 NOTE — Telephone Encounter (Signed)
No answer. Left message to call us if she needs anything.

## 2011-04-08 ENCOUNTER — Other Ambulatory Visit: Payer: Self-pay | Admitting: Internal Medicine

## 2011-04-08 MED ORDER — PREDNISONE 10 MG PO TABS: ORAL_TABLET | ORAL | Status: DC

## 2011-04-08 NOTE — Telephone Encounter (Signed)
Sent predisone. As per Dr Nichola Sizer Flexible Sigmoidoscopy report, patient is to be on Prednisone 30 mg x 2 weeks.

## 2011-04-11 ENCOUNTER — Encounter: Payer: Self-pay | Admitting: Internal Medicine

## 2011-04-14 ENCOUNTER — Ambulatory Visit: Payer: 59 | Admitting: Internal Medicine

## 2011-04-17 ENCOUNTER — Encounter: Payer: Self-pay | Admitting: Internal Medicine

## 2011-04-17 ENCOUNTER — Ambulatory Visit (INDEPENDENT_AMBULATORY_CARE_PROVIDER_SITE_OTHER): Payer: 59 | Admitting: Internal Medicine

## 2011-04-17 VITALS — BP 168/94 | HR 78 | Temp 98.1°F | Wt 164.0 lb

## 2011-04-17 DIAGNOSIS — E785 Hyperlipidemia, unspecified: Secondary | ICD-10-CM | POA: Insufficient documentation

## 2011-04-17 DIAGNOSIS — F419 Anxiety disorder, unspecified: Secondary | ICD-10-CM

## 2011-04-17 DIAGNOSIS — K5190 Ulcerative colitis, unspecified, without complications: Secondary | ICD-10-CM

## 2011-04-17 DIAGNOSIS — F411 Generalized anxiety disorder: Secondary | ICD-10-CM

## 2011-04-17 DIAGNOSIS — I10 Essential (primary) hypertension: Secondary | ICD-10-CM

## 2011-04-17 MED ORDER — LOVASTATIN 40 MG PO TABS: 40.0000 mg | ORAL_TABLET | Freq: Every day | ORAL | Status: DC

## 2011-04-19 NOTE — Assessment & Plan Note (Signed)
Having a flare and Dr. Olevia Perches has adjusted her medications including increased steroids.

## 2011-04-19 NOTE — Progress Notes (Signed)
  Subjective:    Patient ID: Latasha Wood, female    DOB: 1955-12-12, 55 y.o.   MRN: [id number]  HPI Mrs. Toro returns for follow-up of her Blood pressure and her anxiety/depression. She has brought an extensive record of BP readings which reveal frequent excursions into high BP range. She has been asymptomatic.  In regard to her emotional issues she has seen a counselor/therpist who works in Dr. Starleen Arms office. She has been scheduled to see Dr. Toy Care for medication management of her symptoms.  PMH, FamHx and SocHx reviewed for any changes and relevance.    Review of Systems Review of Systems  Constitutional:  Negative for fever, chills, activity change and unexpected weight change.  HEENT:  Negative for hearing loss, ear pain, congestion, neck stiffness and postnasal drip. Negative for sore throat or swallowing problems. Negative for dental complaints.   Eyes: Negative for vision loss or change in visual acuity.  Respiratory: Negative for chest tightness and wheezing.   Cardiovascular: Negative for chest pain and palpitationNo decreased exercise tolerance Gastrointestinal: No change in bowel habit. No bloating or gas. No reflux or indigestion Genitourinary: Negative for urgency, frequency, flank pain and difficulty urinating.  Musculoskeletal: Negative for myalgias, back pain, arthralgias and gait problem.  Neurological: Negative for dizziness, tremors, weakness and headaches.  Hematological: Negative for adenopathy.  Psychiatric/Behavioral: Negative for behavioral problems and dysphoric mood.       Objective:   Physical Exam Vitals reviewed Gen'l - WNWD AA woman in no acute medical distress but emotionally labile and concerned. HEENT - C&S clear Pul - normal respirations Cor RRR       Assessment & Plan:

## 2011-04-19 NOTE — Assessment & Plan Note (Signed)
Not well controlled on ACE/Hct.  Plan- change to tribenzor (ARB,CCB,Hct) once a day          Track BP and report back.

## 2011-04-19 NOTE — Assessment & Plan Note (Signed)
Many stressors leading to anxiety and depression with tearfulness and emotional lability. She feels this is a personal failure. We discussed emotional illness as being similar to any other illness and that it doesn't represent a personal failure. She is encouraged to keep appt with Dr. Toy Care and she is reassured that medications will facilitate the "talking" therapy in regard to gaining deeper understanding of her triggers and developing better coping strategies.

## 2011-04-20 ENCOUNTER — Telehealth: Payer: Self-pay | Admitting: Internal Medicine

## 2011-04-20 NOTE — Telephone Encounter (Signed)
Patient calling to let us know her stools are now soft and formed. No bleeding. She is taking all her medications. She has been taking Prednisone 30 mg x 2 weeks and wants to know if she should stay at 30 mg/day or decrease. F/U OV on 04/29/11. Please, advise

## 2011-04-20 NOTE — Telephone Encounter (Signed)
OK to reduce Prednisone to 42m/day x 1 week then to 214m day till she sees me.

## 2011-04-20 NOTE — Telephone Encounter (Signed)
lmom to call back 

## 2011-04-20 NOTE — Telephone Encounter (Addendum)
Notified pt that per Dr Olevia Perches, she may reduce Prednisone to 79m/daily x 1 week and then reduce to 222mdaily until she sees Dr BrOlevia Perchesn 04/29/11. Pt was very happy!

## 2011-04-28 ENCOUNTER — Telehealth: Payer: Self-pay

## 2011-04-28 NOTE — Telephone Encounter (Signed)
Pt called to report BP reading since starting Tribenzor samples received at last OV 06-12 126/81 06-14 137/98 06-15 124/85 06-16 147/89 06-17 126/84  06-18 131/88  Today  128/83  Pt is requesting MD advise on if she is to continue medication and if so Rx will be needed as she will be out of samples 06/22.

## 2011-04-29 ENCOUNTER — Encounter: Payer: Self-pay | Admitting: Internal Medicine

## 2011-04-29 ENCOUNTER — Ambulatory Visit (INDEPENDENT_AMBULATORY_CARE_PROVIDER_SITE_OTHER): Payer: 59 | Admitting: Internal Medicine

## 2011-04-29 VITALS — BP 110/78 | HR 80 | Ht 64.5 in | Wt 166.8 lb

## 2011-04-29 DIAGNOSIS — K5100 Ulcerative (chronic) pancolitis without complications: Secondary | ICD-10-CM

## 2011-04-29 MED ORDER — MERCAPTOPURINE 50 MG PO TABS: ORAL_TABLET | ORAL | Status: DC

## 2011-04-29 NOTE — Progress Notes (Signed)
Latasha Wood [birth year] MRN [id number]     History of Present Illness:  This is a 55 year old African American female with ulcerative colitis of more than 20 years duration. She had a recent exacerbation which started in January of this year and has not responded to a steroid taper. A recent flexible sigmoidoscopy showed active colitis from 0-40 cm. She has been symptomatically improved but her stools are still soft. There is no blood. She is down to 25 mg of prednisone a day. She is also on a Lialda 4.8 g a day and 6-MP 50 mg a day. She is unable to hold her cort enemas. She is not taking her Canasa suppositories as she is supposed to. She has been very nervous and overwhelmed by her disease and work. She would like to have FMLA leave.   Past Medical History  Diagnosis Date  . Hypertension   . Colitis, ulcerative   . Hyperlipidemia   . Colon polyps   . Blood transfusion   . Anemia    Past Surgical History  Procedure Date  . Gravida 6 para 2     all SAB  . Hyadiform mole   . Dilation and curettage of uterus     most likely after miscarriage  . Abdominal hysterectomy   . Knee arthroscopy w/ meniscal repair     right knee (Daldorf)  . Fibrocystic breast disease     q 6 month mammogram    reports that she has never smoked. She has never used smokeless tobacco. She reports that she drinks about one ounce of alcohol per week. She reports that she does not use illicit drugs. family history includes Breast cancer in her mother; Breast cancer (age of onset:32) in her sister; Colon cancer in her paternal aunt; Diabetes in her brother, father, mother, and sister; and Heart disease in her father. No Known Allergies      Review of Systems:  The remainder of the 10  point ROS is negative except as outlined in H&P     Assessment and Plan:  Problem #1 We had a long conversation about her ulcerative colitis and about her taking time out of work. She will increase her 6-MP to 75  mg a day and continue on mesalamine and cort enemas on the weekends. She would decrease her prednisone to 20 mg a day and stay on it for at least 2 weeks. I will see her in 8 weeks. I advised for her to see her gynecologist to consider estrogen replacement. She is also seeing Latasha Wood for consideration of psychotropic medications.   04/29/2011 Latasha Wood

## 2011-04-29 NOTE — Patient Instructions (Addendum)
We have increased your 59m dosage to 75 mg daily (so, you should take 1.5 tablets of the 50 mg). A new script has been called into the pharmacy. Please decrease your prednisone to 20 mg daily as directed. CC: Dr NLinda Hedges Dr KToy Care

## 2011-05-04 ENCOUNTER — Telehealth: Payer: Self-pay | Admitting: *Deleted

## 2011-05-04 MED ORDER — OLMESARTAN-AMLODIPINE-HCTZ 40-5-12.5 MG PO TABS: 1.0000 | ORAL_TABLET | Freq: Every day | ORAL | Status: DC

## 2011-05-04 NOTE — Telephone Encounter (Signed)
Patient requesting RF of tribenzor to go to [facility] - [facility] supply and [facility] day. She would like a call when complete.

## 2011-05-04 NOTE — Telephone Encounter (Signed)
Rx sent to both pharm. Pt advised.

## 2011-05-12 ENCOUNTER — Other Ambulatory Visit (INDEPENDENT_AMBULATORY_CARE_PROVIDER_SITE_OTHER): Payer: 59

## 2011-05-12 ENCOUNTER — Other Ambulatory Visit: Payer: Self-pay | Admitting: Internal Medicine

## 2011-05-12 DIAGNOSIS — E785 Hyperlipidemia, unspecified: Secondary | ICD-10-CM

## 2011-05-12 LAB — LDL CHOLESTEROL, DIRECT: Direct LDL: 83 mg/dL

## 2011-05-12 LAB — LIPID PANEL
Total CHOL/HDL Ratio: 2
VLDL: 16.4 mg/dL (ref 0.0–40.0)

## 2011-05-12 LAB — HEPATIC FUNCTION PANEL
AST: 33 U/L (ref 0–37)
Alkaline Phosphatase: 47 U/L (ref 39–117)
Total Bilirubin: 0.9 mg/dL (ref 0.3–1.2)

## 2011-05-14 ENCOUNTER — Encounter: Payer: Self-pay | Admitting: Internal Medicine

## 2011-05-19 ENCOUNTER — Other Ambulatory Visit: Payer: Self-pay | Admitting: Internal Medicine

## 2011-05-19 MED ORDER — MERCAPTOPURINE 50 MG PO TABS: ORAL_TABLET | ORAL | Status: DC

## 2011-05-19 MED ORDER — MESALAMINE 1.2 G PO TBEC: DELAYED_RELEASE_TABLET | ORAL | Status: DC

## 2011-05-19 NOTE — Telephone Encounter (Signed)
rx sent

## 2011-05-20 ENCOUNTER — Telehealth: Payer: Self-pay | Admitting: Internal Medicine

## 2011-05-20 NOTE — Telephone Encounter (Signed)
Patient wanted to let Dr. Olevia Perches know she is going to get FMLA paperwork for intermittent leave.

## 2011-05-20 NOTE — Telephone Encounter (Signed)
OK I will fill it out

## 2011-05-21 ENCOUNTER — Telehealth: Payer: Self-pay | Admitting: Internal Medicine

## 2011-05-21 MED ORDER — PREDNISONE 10 MG PO TABS: ORAL_TABLET | ORAL | Status: DC

## 2011-05-21 MED ORDER — HYDROCORTISONE 100 MG/60ML RE ENEM: 100.0000 mg | ENEMA | Freq: Every day | RECTAL | Status: DC

## 2011-05-21 NOTE — Telephone Encounter (Signed)
Patient is calling to report she is still having loose stools, no bleeding and feels "heat" in the area. She is out of Cortenema's and is wondering if she should continue them. Please, advise.

## 2011-05-21 NOTE — Telephone Encounter (Signed)
Patient notified of Dr. Nichola Sizer recommendations. Rx for Cortenema and Prednisone sent to pharmacy. F/u OV on 06/12/11.

## 2011-05-21 NOTE — Telephone Encounter (Signed)
If her stools are still loose, then go up on Prednisone by  5 mg. ( so, if she is on 20 mg, she should go up to 25 mg), and renew Cort-enemas, 1 qhs. Will need an OV within 4 weeks.

## 2011-05-22 ENCOUNTER — Telehealth: Payer: Self-pay | Admitting: *Deleted

## 2011-05-22 NOTE — Telephone Encounter (Signed)
Spoke with pharmacy and clarified 2 week supply of enemas

## 2011-06-10 ENCOUNTER — Other Ambulatory Visit (INDEPENDENT_AMBULATORY_CARE_PROVIDER_SITE_OTHER): Payer: 59

## 2011-06-10 ENCOUNTER — Encounter: Payer: Self-pay | Admitting: Internal Medicine

## 2011-06-10 ENCOUNTER — Ambulatory Visit (INDEPENDENT_AMBULATORY_CARE_PROVIDER_SITE_OTHER): Payer: 59 | Admitting: Internal Medicine

## 2011-06-10 ENCOUNTER — Other Ambulatory Visit: Payer: Self-pay | Admitting: Internal Medicine

## 2011-06-10 VITALS — BP 116/78 | HR 72 | Ht 65.0 in | Wt 167.0 lb

## 2011-06-10 DIAGNOSIS — I10 Essential (primary) hypertension: Secondary | ICD-10-CM

## 2011-06-10 DIAGNOSIS — M542 Cervicalgia: Secondary | ICD-10-CM

## 2011-06-10 DIAGNOSIS — K5190 Ulcerative colitis, unspecified, without complications: Secondary | ICD-10-CM

## 2011-06-10 DIAGNOSIS — E785 Hyperlipidemia, unspecified: Secondary | ICD-10-CM

## 2011-06-10 DIAGNOSIS — K5150 Left sided colitis without complications: Secondary | ICD-10-CM

## 2011-06-10 LAB — CBC WITH DIFFERENTIAL/PLATELET
Eosinophils Relative: 0.6 % (ref 0.0–5.0)
MCV: 96.8 fl (ref 78.0–100.0)
Monocytes Absolute: 0.3 10*3/uL (ref 0.1–1.0)
Neutrophils Relative %: 48.7 % (ref 43.0–77.0)
Platelets: 244 10*3/uL (ref 150.0–400.0)
WBC: 7.4 10*3/uL (ref 4.5–10.5)

## 2011-06-10 LAB — LIPID PANEL
Cholesterol: 251 mg/dL — ABNORMAL HIGH (ref 0–200)
Total CHOL/HDL Ratio: 2

## 2011-06-10 NOTE — Progress Notes (Signed)
  Subjective:    Patient ID: Latasha Wood, female    DOB: February 17, 1956, 55 y.o.   MRN: [id number]  HPI Latasha Wood presents for follow-up of hypertension. She has had low readings and also has had significant fluid retention. She has also had episodes of dizziness: with position change, with activity. Usually this will not last more than a few minutes. She has had no syncope or near-syncope. She has had associated hypotension at some instances of dizziness.  She has been having pain in the right trapezius and posterior cervical region. No paresthesia or weakness.   She has seen Dr. Olevia Perches  - increased dosing of prednisone for colitis.   I have reviewed the patient's medical history in detail and updated the computerized patient record.    Review of Systems  Constitutional: Negative.   HENT: Negative.   Eyes: Negative.   Respiratory: Negative.   Cardiovascular: Negative.   Gastrointestinal: Positive for abdominal pain, diarrhea, blood in stool and abdominal distention.  Genitourinary: Negative.   Skin: Negative.   Neurological: Negative.        Objective:   Physical Exam Vitals noted - BP is better controlled Gen'l - overweight AA woman in no distress HEENT - - neck with full ROM. Tender at the right posterior cervical area and trapezius Cor - RRR       Assessment & Plan:

## 2011-06-10 NOTE — Progress Notes (Signed)
Latasha Wood Apr 11, 1956 MRN [id number]        History of Present Illness:  This is a 55 year old, African American female with left-sided ulcerative colitis  for more than 20 years duration,  recent flareup starting January 2012. Steroid dependent. Unable to taper below 25 mg a day. She is passing blood and has urgency and diarrhea. She is on the mesalamine 4.8 g daily and 6 MP 75 mg daily. She has not been able to hold cort enemas. I have filled out FMLA leave  papers for her. .Flexible sigmoidoscopy in June 2012 showed active colitis from 0-40 cm. She has been anxious and depressed under the care of Dr Toy Care. she has gained weight secondary to steroids. She is somewhat improved, the above regimen having formed stools..   Past Medical History  Diagnosis Date  . Hypertension   . Colitis, ulcerative   . Hyperlipidemia   . Colon polyps   . Blood transfusion   . Anemia    Past Surgical History  Procedure Date  . Gravida 6 para 2     all SAB  . Hyadiform mole   . Dilation and curettage of uterus     most likely after miscarriage  . Abdominal hysterectomy   . Knee arthroscopy w/ meniscal repair     right knee (Daldorf)  . Fibrocystic breast disease     q 6 month mammogram    reports that she has never smoked. She has never used smokeless tobacco. She reports that she drinks about one ounce of alcohol per week. She reports that she does not use illicit drugs. family history includes Breast cancer in her mother; Breast cancer (age of onset:32) in her sister; Colon cancer in her paternal aunt; Diabetes in her brother, father, mother, and sister; and Heart disease in her father. No Known Allergies      Review of Systems:  The remainder of the 10  point ROS is negative except as outlined in H&P   Physical Exam: General appearance  Well developed in no distress, appears slightly cushingoid   Assessment and Plan:  Active ulcerative colitis symptomatically improved on a  steroid taper. She will  reduce  prednisone to 20 mg daily and will call us in 2 weeks. She will continue Asacol 4.8 g daily and 6-MP 75 mg daily. We have briefly talked about using biological such as Humira. We will discuss that with her next appointment.CBC today.   06/10/2011 [name]

## 2011-06-10 NOTE — Patient Instructions (Addendum)
Dr Linda Hedges

## 2011-06-11 LAB — COMPREHENSIVE METABOLIC PANEL
ALT: 34 U/L (ref 0–35)
AST: 18 U/L (ref 0–37)
Albumin: 3.9 g/dL (ref 3.5–5.2)
Alkaline Phosphatase: 43 U/L (ref 39–117)
BUN: 16 mg/dL (ref 6–23)
CO2: 31 mEq/L (ref 19–32)
Calcium: 8.6 mg/dL (ref 8.4–10.5)
Chloride: 101 mEq/L (ref 96–112)
Creatinine, Ser: 0.9 mg/dL (ref 0.4–1.2)
GFR: 83.22 mL/min (ref >60.00)
Glucose, Bld: 81 mg/dL (ref 70–99)
Potassium: 3.7 mEq/L (ref 3.5–5.1)
Sodium: 139 mEq/L (ref 135–145)
Total Bilirubin: 0.9 mg/dL (ref 0.3–1.2)
Total Protein: 6.3 g/dL (ref 6.0–8.3)

## 2011-06-11 LAB — HEPATIC FUNCTION PANEL
ALT: 34 U/L (ref 0–35)
AST: 18 U/L (ref 0–37)
Albumin: 3.9 g/dL (ref 3.5–5.2)
Alkaline Phosphatase: 43 U/L (ref 39–117)
Bilirubin, Direct: 0.1 mg/dL (ref 0.0–0.3)
Total Bilirubin: 0.9 mg/dL (ref 0.3–1.2)
Total Protein: 6.3 g/dL (ref 6.0–8.3)

## 2011-06-11 LAB — LDL CHOLESTEROL, DIRECT: Direct LDL: 102.3 mg/dL

## 2011-06-13 DIAGNOSIS — M542 Cervicalgia: Secondary | ICD-10-CM | POA: Insufficient documentation

## 2011-06-13 NOTE — Assessment & Plan Note (Signed)
BP Readings from Last 3 Encounters:  06/10/11 138/88  06/10/11 116/78  04/29/11 110/78   Patient had reduced medication and BP is up slightly but she is feeling better.  Plan - trial of benicar/hct 40/12.5           Monitor BP and report back. If controlled on this med will send in Rx.

## 2011-06-13 NOTE — Assessment & Plan Note (Signed)
Patient with pain n the right neck and trapezius region. She has full ROM and no radicular symptoms.  Plan - ROM exercise - head rolls, shoulder shrugs            Myofascial massage with use of tennis ball or eqivalent           Lineament of choice

## 2011-06-15 ENCOUNTER — Encounter: Payer: Self-pay | Admitting: Internal Medicine

## 2011-06-23 ENCOUNTER — Telehealth: Payer: Self-pay | Admitting: *Deleted

## 2011-06-23 NOTE — Telephone Encounter (Signed)
Pt left VM - she says she was given samples of benicar 40/25. Patient requesting RX. I see in Van notes it says trial of benicar 40/12.5. What RX is ok to send in?

## 2011-06-23 NOTE — Telephone Encounter (Signed)
benicar 40/12.5 # 30 refill prn. Add to med list

## 2011-06-24 ENCOUNTER — Inpatient Hospital Stay (HOSPITAL_COMMUNITY)
Admission: EM | Admit: 2011-06-24 | Discharge: 2011-06-27 | DRG: 389 | Disposition: A | Discharge status: Home / Self Care | Payer: 59 | Attending: Surgery | Admitting: Surgery

## 2011-06-24 ENCOUNTER — Emergency Department (HOSPITAL_COMMUNITY): Payer: 59

## 2011-06-24 ENCOUNTER — Encounter (HOSPITAL_COMMUNITY): Payer: Self-pay | Admitting: Radiology

## 2011-06-24 DIAGNOSIS — K56609 Unspecified intestinal obstruction, unspecified as to partial versus complete obstruction: Secondary | ICD-10-CM

## 2011-06-24 DIAGNOSIS — I10 Essential (primary) hypertension: Secondary | ICD-10-CM | POA: Diagnosis present

## 2011-06-24 DIAGNOSIS — R599 Enlarged lymph nodes, unspecified: Secondary | ICD-10-CM | POA: Diagnosis present

## 2011-06-24 DIAGNOSIS — F411 Generalized anxiety disorder: Secondary | ICD-10-CM | POA: Diagnosis present

## 2011-06-24 DIAGNOSIS — E785 Hyperlipidemia, unspecified: Secondary | ICD-10-CM | POA: Diagnosis present

## 2011-06-24 DIAGNOSIS — K5190 Ulcerative colitis, unspecified, without complications: Secondary | ICD-10-CM | POA: Diagnosis present

## 2011-06-24 DIAGNOSIS — K5150 Left sided colitis without complications: Secondary | ICD-10-CM

## 2011-06-24 DIAGNOSIS — R188 Other ascites: Secondary | ICD-10-CM | POA: Diagnosis present

## 2011-06-24 DIAGNOSIS — R0789 Other chest pain: Secondary | ICD-10-CM | POA: Diagnosis present

## 2011-06-24 DIAGNOSIS — K219 Gastro-esophageal reflux disease without esophagitis: Secondary | ICD-10-CM | POA: Diagnosis present

## 2011-06-24 DIAGNOSIS — IMO0002 Reserved for concepts with insufficient information to code with codable children: Secondary | ICD-10-CM

## 2011-06-24 LAB — COMPREHENSIVE METABOLIC PANEL
Alkaline Phosphatase: 47 U/L (ref 39–117)
BUN: 18 mg/dL (ref 6–23)
Calcium: 10.2 mg/dL (ref 8.4–10.5)
Creatinine, Ser: 0.77 mg/dL (ref 0.50–1.10)
GFR calc Af Amer: >60 mL/min (ref >60)
Glucose, Bld: 100 mg/dL — ABNORMAL HIGH (ref 70–99)
Total Protein: 7 g/dL (ref 6.0–8.3)

## 2011-06-24 LAB — CK TOTAL AND CKMB (NOT AT ARMC)
CK, MB: 2.3 ng/mL (ref 0.3–4.0)
Total CK: 51 U/L (ref 7–177)
Total CK: 49 U/L (ref 7–177)

## 2011-06-24 LAB — DIFFERENTIAL
Basophils Absolute: 0 10*3/uL (ref 0.0–0.1)
Eosinophils Absolute: 0 10*3/uL (ref 0.0–0.7)
Eosinophils Relative: 0 % (ref 0–5)
Lymphocytes Relative: 28 % (ref 12–46)
Lymphs Abs: 3.7 10*3/uL (ref 0.7–4.0)
Monocytes Absolute: 0.6 10*3/uL (ref 0.1–1.0)

## 2011-06-24 LAB — CBC
HCT: 33.1 % — ABNORMAL LOW (ref 36.0–46.0)
MCHC: 34.4 g/dL (ref 30.0–36.0)
MCV: 95.1 fL (ref 78.0–100.0)
RDW: 17 % — ABNORMAL HIGH (ref 11.5–15.5)

## 2011-06-24 LAB — URINALYSIS, ROUTINE W REFLEX MICROSCOPIC
Bilirubin Urine: NEGATIVE
Hgb urine dipstick: NEGATIVE
Protein, ur: NEGATIVE mg/dL
Urobilinogen, UA: 0.2 mg/dL (ref 0.0–1.0)

## 2011-06-24 LAB — POCT I-STAT TROPONIN I: Troponin i, poc: 0 ng/mL (ref 0.00–0.08)

## 2011-06-24 MED ORDER — IOHEXOL 300 MG/ML  SOLN
100.0000 mL | Freq: Once | INTRAMUSCULAR | Status: AC | PRN
  Administered 2011-06-24: 100 mL via INTRAVENOUS

## 2011-06-24 MED ORDER — OLMESARTAN MEDOXOMIL-HCTZ 40-12.5 MG PO TABS: 1.0000 | ORAL_TABLET | Freq: Every day | ORAL | Status: DC

## 2011-06-24 NOTE — Telephone Encounter (Signed)
Done, left vm for patient

## 2011-06-25 ENCOUNTER — Inpatient Hospital Stay (HOSPITAL_COMMUNITY): Payer: 59

## 2011-06-25 LAB — MAGNESIUM: Magnesium: 2 mg/dL (ref 1.5–2.5)

## 2011-06-25 LAB — CBC
MCV: 97.5 fL (ref 78.0–100.0)
Platelets: 355 10*3/uL (ref 150–400)
RBC: 3.16 MIL/uL — ABNORMAL LOW (ref 3.87–5.11)
WBC: 7.1 10*3/uL (ref 4.0–10.5)

## 2011-06-25 LAB — CK TOTAL AND CKMB (NOT AT ARMC)
CK, MB: 2.1 ng/mL (ref 0.3–4.0)
Total CK: 52 U/L (ref 7–177)

## 2011-06-25 LAB — GLUCOSE, CAPILLARY: Glucose-Capillary: 157 mg/dL — ABNORMAL HIGH (ref 70–99)

## 2011-06-25 LAB — BASIC METABOLIC PANEL
BUN: 11 mg/dL (ref 6–23)
Chloride: 103 mEq/L (ref 96–112)
GFR calc Af Amer: >60 mL/min (ref >60)
Potassium: 3.2 mEq/L — ABNORMAL LOW (ref 3.5–5.1)

## 2011-06-25 LAB — TROPONIN I: Troponin I: <0.3 ng/mL (ref <0.30)

## 2011-06-25 NOTE — Consult Note (Signed)
Latasha Wood, Latasha Wood           ACCOUNT NO.:  [id number]  MEDICAL RECORD NO.:  [id number]  LOCATION:  3335                         FACILITY:  [facility]  PHYSICIAN:  Niel Hummer, MD    DATE OF BIRTH:  Jan 03, 1956  DATE OF CONSULTATION: DATE OF DISCHARGE:                                CONSULTATION   REASON FOR CONSULTATION:  Chest pain.  HISTORY OF PRESENT ILLNESS:  This is a 55 year old with history of ulcerative colitis followed by Dr. Olevia Perches, recently have an endoscopy on May 25, which showed severe colitis with bleeding of 5-30 cm and normal colon above 40 cm.  The patient related that she was on an her usual state of health until last night after dinner when she started to have lower abdominal pain.  It got progressively worse.  She tried laxatives without significant help.  She also noticed to have chest pain.  She described the chest pain as pressure-like, tightness.  She related a prior episode of chest pain over the last few weeks ago that resolved by its own.  She thought that it was anxiety related.  She described the pain as a pressure, 6/10 on intensity sometimes with some shortness of breath.  Right now she denies shortness of breath.  She stated that the pain is in the middle of her chest, intermittent, not associated with deep breaths.  She noticed some worsening of the chest pain after she had the NG tube placed , but she had this chest pain before. She denies any prior history of asthma.  she also relates some nausea and vomiting.  She denies burning sensation of her chest.  ALLERGIES:  No known drug allergies.  PAST MEDICAL HISTORY: 1. Ulcerative colitis. 2. Hypertension. 3. Anemia. 4. Dyslipidemia.  SOCIAL HISTORY:  Denies tobacco, alcohol socially.  No recreational drugs.  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  PAST SURGICAL HISTORY: 1. Exploratory laparotomy, D and C in 2002. 2. Hysterectomy.  FAMILY HISTORY:  Father died  of heart failure in 50s.  Mother is sleeping, had history of stroke.  MEDICATIONS: 1. Apresoline 25 mg p.r.n. 2. Ferrous sulfate. 3. Calcium 500 mg b.i.d. 4. Mesalamine 1.25 g 4 tablets daily. 5. Hydrochlorothiazide 40/25 mg daily. 6. Mercaptopurine 50 mg 1-1/2 tablet daily. 7. Prednisone 10 mg 2.5 tablets daily.  PHYSICAL EXAMINATION:  VITAL SIGNS:  Temperature 97.5, pulse 74, blood pressure 120/77, respirations 18, and sat 96 on room air. GENERAL:  The patient sitting in bed in no acute distress. HEENT:  Head atraumatic and normocephalic.  Eyes anicteric.  Pupil equal and reactive to light.  Extraocular muscles are intact. NECK:  Supple.  No JVD.  No carotid bruits. LUNGS:  Bilateral good air movement.  No wheezing, crackles or rhonchi. CARDIOVASCULAR:  S1-S2 regular rhythm and rate.  No rubs, murmurs or gallops.  Nontender to palpation. ABDOMEN:  Bowel sounds hypoactive.  Abdomen is slightly distended.  Mild tenderness on palpation.  No rigidity. EXTREMITY:  No edema, no stenosis. SKIN:  Normal. NEUROLOGIC EXAM:  Nonfocal.  ADMISSION LABS: 1. White blood cells 13, hemoglobin 11.4, hematocrit 33, platelet 416,     and ANC 9.1.  Sodium 143, potassium 3.3,  chloride 98, bicarb 36,     BUN 18, creatinine 0.7, glucose 100, calcium 10.2, albumin 3.8, AST     16, troponin 0.3, CK-MB 2.3, total CK 51, sodium 142, potassium     3.3, chloride 98, bicarb 36, and glucose 100.  UA negative.     Pregnancy test negative. 2. CT abdomen and pelvis.  Findings compatible with small bowel     obstruction with transition point in the lower pelvis as described     above. 3. KUB, mild small bowel dilation with air fluid levels suggesting     early abduction.  Liquid stool in the colon.  ASSESSMENT AND PLAN:  This is a 55 year old, at present, with small bowel obstruction also complaining of chest pain. 1. Chest pain may be referred from her small bowel obstruction.  A 45-     year-old with  history of hypertension.  Will cycle cardiac enzymes     electrocardiogram.  Her first set of heart enzymes is negative.     Also I will check a D-dimer, if positive will proceed with computed     tomography angiography.  I will start Protonix 40 mg intravenous     b.i.d.  We will get a 2-D echo.  I will get a chest x-ray after     placement of the nasogastric tube. 2. Ulcerative colitis, we are going to defer steroid use to Dr.     Olevia Perches. 3. Hypertension.  Hold blood pressure medication at this time.  Her     systolic blood pressure is in the 120.  If needed, we can use     hydralazine 10 mg intravenously q.8 hours p.r.n. for systolic blood     pressure more than 150. 4. Small bowel obstruction, Surgery following and helping with     management. 5. History of anxiety.  We will start ativan 0.5 intravenously b.i.d.     p.r.n. to avoid withdrawal.  Hold for sedation.  Thank you for the consultation.  We will follow the patient with you.     Niel Hummer, MD     BR/MEDQ  D:  06/24/2011  T:  06/25/2011  Job:  [id number]  Electronically Signed by Niel Hummer MD on 06/25/2011 12:06:12 PM

## 2011-06-26 DIAGNOSIS — I10 Essential (primary) hypertension: Secondary | ICD-10-CM

## 2011-06-26 DIAGNOSIS — R079 Chest pain, unspecified: Secondary | ICD-10-CM

## 2011-06-26 DIAGNOSIS — K56609 Unspecified intestinal obstruction, unspecified as to partial versus complete obstruction: Secondary | ICD-10-CM

## 2011-06-26 LAB — BASIC METABOLIC PANEL
BUN: 7 mg/dL (ref 6–23)
CO2: 30 mEq/L (ref 19–32)
Calcium: 9.2 mg/dL (ref 8.4–10.5)
GFR calc non Af Amer: >60 mL/min (ref >60)
Glucose, Bld: 108 mg/dL — ABNORMAL HIGH (ref 70–99)

## 2011-06-26 LAB — CBC
HCT: 28.4 % — ABNORMAL LOW (ref 36.0–46.0)
Hemoglobin: 9.9 g/dL — ABNORMAL LOW (ref 12.0–15.0)
MCHC: 34.9 g/dL (ref 30.0–36.0)
MCV: 94.4 fL (ref 78.0–100.0)
RDW: 17.2 % — ABNORMAL HIGH (ref 11.5–15.5)
WBC: 5.7 10*3/uL (ref 4.0–10.5)

## 2011-07-09 NOTE — H&P (Signed)
Latasha Wood, Latasha Wood           ACCOUNT NO.:  [id number]  MEDICAL RECORD NO.:  [id number]  LOCATION:  WLED                         FACILITY:  [facility]  PHYSICIAN:  Adin Hector, MD     DATE OF BIRTH:  25-Jul-1956  DATE OF ADMISSION:  06/24/2011 DATE OF DISCHARGE:                             HISTORY & PHYSICAL   PRIMARY CARE DOCTOR:  Dr. Linda Hedges.  GASTROENTEROLOGY:  Dr. [name].  REASON FOR CONSULTATION:  Abdominal pain, nausea, and vomiting.  BRIEF HISTORY:  The patient is a 55 year old African American female with a history of ulcerative colitis followed by Dr. Olevia Perches.  She recently underwent endoscopy by Dr. Olevia Perches on Apr 03, 2011, which showed severe colitis with bleeding at 5 to 30 cm, patchy colitis at 30 to 40 cm and normal colon above 40 cm.  She has been on medications and doing fairly well.  Last night after dinner, she started bloating and developing cramps.  It became progressively worse.  She developed nausea and vomiting.  She sent her husband to obtain Fleet Enema, which she tried to use to help her symptoms making a bowel movement would help her feel better.  This was ineffective.  She subsequently came to the emergency room at [facility] where she has undergone evaluation including a flat plate of the abdomen showing a mild small bowel dilatation with questionable obstruction.  A CT was subsequently obtained, which showed dilation of small bowel, left mid abdominal transition point.  She has some pelvic ascites.  There is also some lymphadenopathy.  She has left liver lobe cyst.  Gallbladder was normal. Ducts were normal.  She also has a 12-mm cyst in the right kidney.  We were asked to see in consultation.  PAST MEDICAL HISTORY: 1. Ulcerative colitis followed by Dr. Olevia Perches. 2. History of small bowel obstruction in [place], December 2005. 3. Normal HIDA scan, 2007. 4. History of anemia. 5. History of high blood pressure. 6. Dyslipidemia. 7.  History of anxiety.  PAST SURGICAL HISTORY: 1. EGD as described above, Apr 03, 2011, Dr. Olevia Perches. 2. Exploratory laparotomy, D and C, January 2002. 3. Hysterectomy, June 2002.  FAMILY HISTORY:  Father died with heart failure and diabetes.  Mother is living.  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  One brother is living with diabetes.  She has 4 sisters, 1 with diabetes and 1 with breast cancer.  SOCIAL HISTORY:  Tobacco, none.  Alcohol, social.  Drugs, none.  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  REVIEW OF SYSTEMS:  FEVER:  None.  Her weight has been up since she has been on the prednisone.  Appetite has been quite good.  SKIN:  She describes some itchy, dry changes she notes around her mouth in the mornings especially.  She has been treating with a cream that is new. CV:  She has had some presyncopal or dizziness with her blood pressure being low on blood pressure medicines.  PULMONARY:  No orthopnea.  No PND.  No dyspnea on exertion.  No coughing, wheezing, or recent URIs. CARDIAC:  She had a workup last year for cardiac concerns, which was reported negative.  GI:  As above.  GU:  No trouble voiding.  LOWER EXTREMITIES:  Positive for some swelling.  It goes down in the evenings. She does not have claudication, but she complains of some pain in the front of her legs with ambulation.  ENDOCRINE:  No diabetes.  She is aware of thyroid issues.  PSYCHIATRIC:  No changes.  CURRENT MEDICATIONS: 1. Alprazolam 0.5 mg p.r.n. 2. FeSO4, 3 daily. 3. Calcium 500 mg 2 daily. 4. Mesalamine 1.25 g 4 tablets daily. 5. Benicar/hydrochlorothiazide 40/25 mg 1 daily. 6. Mercaptopurine 50 mg one-half tablet daily. 7. Prednisone 10 mg, 2.5 tablets daily.  ALLERGIES:  No medication allergies known.  She is intolerant to LOVASTATIN.  PHYSICAL EXAMINATION:  GENERAL:  This is a well-nourished and well- developed white female in no acute  distress. VITAL SIGNS:  Temperature is 97.5, heart rate is 74, blood pressure is 123/85, respiratory rate is 20, and saturation 99%. HEAD:  Normocephalic. EYES, EARS, NOSE, AND THROAT:  Grossly within normal limits. NECK:  Trachea is in the midline.  There are no bruits.  No JVD.  No thyromegaly. CHEST:  Clear to auscultation and percussion. CARDIAC:  Normal S1 and S2.  She was nontender to palpation. ABDOMEN:  Bowel sounds are hypoactive.  Abdomen is slightly distended and she is slightly tender, much better than what she was originally according to the patient.  No hernia, masses, or abscesses noted. GU/RECTAL:  Deferred. LYMPHATIC:  Lymphadenopathy, none palpated. MUSCULOSKELETAL:  No changes noted. SKIN:  Normal.  She complains of some dry skin.  She notes this is around her mouth currently.  I do not see any significant changes. NEUROLOGIC:  No focal deficits. PSYCHIATRIC:  Normal affect.  LABORATORY DATA:  UA is normal.  Urine pregnancy is negative.  Sodium is 142, potassium is 3.3, chloride is 98, CO2 is 36, BUN is 18, creatinine is 0.77, glucose is 100, total bilirubin 0.7, alkaline phosphatase 47, SGOT is 16, SGPT is 24, albumin is 3.8, and protein 7.0.  White count is 13.5, hemoglobin is 11, hematocrit 33, and platelets 416,000.  IMPRESSION: 1. Small bowel obstruction.  She had a similar problem in 2005. 2. History of colitis, on prednisone with recent colonoscopy by 2012,     Dr. Olevia Perches. 3. Gastroesophageal reflux disease/hiatal hernia. 4. Hypertension. 5. Dyslipidemia. 6. Mild hypokalemia.  PLAN:  NG has been placed with good drainage of her stomach.  We are going to hydrate her, put her on bowel rest.  We will ask Dr. Olevia Perches to help with treatment of her colitis with further workup and treatment as indicated.     Lydia Guiles, P.A.   ______________________________ Adin Hector, MD    WDJ/MEDQ  D:  06/24/2011  T:  06/24/2011  Job:   [id number]  Electronically Signed by Earnstine Regal P.A. on 07/06/2011 09:05:53 PM Electronically Signed by Michael Boston MD on 07/09/2011 08:20:41 AM

## 2011-07-29 ENCOUNTER — Ambulatory Visit (INDEPENDENT_AMBULATORY_CARE_PROVIDER_SITE_OTHER): Payer: 59 | Admitting: Internal Medicine

## 2011-07-29 ENCOUNTER — Encounter: Payer: Self-pay | Admitting: Internal Medicine

## 2011-07-29 VITALS — BP 134/68 | HR 92 | Ht 64.0 in | Wt 168.0 lb

## 2011-07-29 DIAGNOSIS — K56609 Unspecified intestinal obstruction, unspecified as to partial versus complete obstruction: Secondary | ICD-10-CM

## 2011-07-29 DIAGNOSIS — K5100 Ulcerative (chronic) pancolitis without complications: Secondary | ICD-10-CM

## 2011-07-29 MED ORDER — PREDNISONE 10 MG PO TABS: ORAL_TABLET | ORAL | Status: DC

## 2011-07-29 NOTE — Patient Instructions (Addendum)
Please come for return office visit in 3 months. Please take iron (OTC) once daily. Dr Olevia Perches has advised that you be on a prednisone taper. The taper instructions are as follows: Take 15 mg daily x 1 month Take 10 mg daily x 1 month Take 5 mg daily x 1 month Take 2.5 mg daily x 1 month Then discontinue! CC: Dr Linda Hedges

## 2011-07-29 NOTE — Progress Notes (Signed)
Latasha Wood 1956-08-15 MRN [id number]    History of Present Illness:  This is a 55 year old African American female with a personal history of ulcerative colitis of more than 20 years duration who is now on a prednisone taper. She is currently on 20 mg a day. She denies any rectal bleeding but has continuous urgency and frequency. She is having 3 bowel movements a day. There are no nocturnal stools and and there is no incontinence. She is on 6-MP 75 mg a day. She was very depressed and emotional on higher doses of steroids and we had been considering biological therapy to help [place] to taper off her prednisone. She is now feeling better. A flexible sigmoidoscopy in June 2012 showed active colitis from 0-40 cm. Biopsies showed chronic active colitis. She takes mesalamine 4.8 g daily. She had an acute hospitalization recently from 06/24/11-08/17 /12 for active nausea, vomiting and suspected small bowel obstruction which responded to an NG suction. A CT scan showed a small amount of ascites but her upper abdominal ultrasound was negative.   Past Medical History  Diagnosis Date  . Hypertension   . Colitis, ulcerative   . Hyperlipidemia   . Colon polyps   . Blood transfusion   . Anemia    Past Surgical History  Procedure Date  . Gravida 6 para 2     all SAB  . Hyadiform mole   . Dilation and curettage of uterus     most likely after miscarriage  . Abdominal hysterectomy   . Knee arthroscopy w/ meniscal repair     right knee (Daldorf)  . Fibrocystic breast disease     q 6 month mammogram    reports that she has never smoked. She has never used smokeless tobacco. She reports that she drinks about one ounce of alcohol per week. She reports that she does not use illicit drugs. family history includes Breast cancer in her mother; Breast cancer (age of onset:32) in her sister; Colon cancer in her paternal aunt; Diabetes in her brother, father, mother, and sister; and Heart disease in her  father. No Known Allergies      Review of Systems: Denies nausea or vomiting chest pain or shortness of breath  The remainder of the 10  point ROS is negative except as outlined in H&P   Physical Exam: General appearance  Well developed, in no distress, mildly cushingoid. Eyes- non icteric. HEENT nontraumatic, normocephalic. Mouth no lesions, tongue papillated, no cheilosis. Neck supple without adenopathy, thyroid not enlarged, no carotid bruits, no JVD. Lungs Clear to auscultation bilaterally. Cor normal S1 normal S2, regular rhythm , no murmur,  quiet precordium. Abdomen soft abdomen, minimally tender left lower quadrant. Normoactive bowel sounds. No distention. Rectal: Soft Hemoccult negative stool. Extremities no pedal edema. Skin no lesions. Neurological alert and oriented x 3. Psychological normal mood and affect.  Assessment and Plan:  Problem #1 Ulcerative colitis; predominantly left-sided. It is still active but much improved on a prednisone taper. We will continue a prednisone taper to 15 mg for 4 weeks, 10 mg for 4 weeks 5 mg for 4 weeks. We will find out from her insurance if a biological would be covered. She will continue with 6-MP and iron supplements. She may at some point need a repeat upper abdominal ultrasound to look for resolution of the ascites. She needs an office visit in 3 months.   07/29/2011 [name]

## 2011-07-31 ENCOUNTER — Ambulatory Visit (INDEPENDENT_AMBULATORY_CARE_PROVIDER_SITE_OTHER): Payer: 59 | Admitting: Internal Medicine

## 2011-07-31 DIAGNOSIS — K5289 Other specified noninfective gastroenteritis and colitis: Secondary | ICD-10-CM

## 2011-07-31 DIAGNOSIS — K529 Noninfective gastroenteritis and colitis, unspecified: Secondary | ICD-10-CM

## 2011-07-31 MED ORDER — TUBERCULIN PPD 5 UNIT/0.1ML ID SOLN: 5.0000 [IU] | Freq: Once | INTRADERMAL | Status: DC

## 2011-08-03 ENCOUNTER — Other Ambulatory Visit: Payer: Self-pay | Admitting: *Deleted

## 2011-08-03 ENCOUNTER — Encounter: Payer: Self-pay | Admitting: *Deleted

## 2011-08-03 ENCOUNTER — Telehealth: Payer: Self-pay | Admitting: *Deleted

## 2011-08-03 NOTE — Telephone Encounter (Signed)
Letter created

## 2011-08-05 NOTE — Discharge Summary (Signed)
  NAMEMILLER, EDGINGTON           ACCOUNT NO.:  [id number]  MEDICAL RECORD NO.:  [id number]  LOCATION:  [place]                         FACILITY:  [facility]  PHYSICIAN:  Isabel Caprice. Hassell Done, MD  DATE OF BIRTH:  10-20-1956  DATE OF ADMISSION:  06/24/2011 DATE OF DISCHARGE:  06/27/2011                              DISCHARGE SUMMARY   HISTORY OF PRESENT ILLNESS:  Ms. Stifter is a 55 year old female with history of ulcerative colitis who developed some abdominal discomfort and distention.  She presented to the emergency department and was found to have evidence of small bowel dilatation possibly concerning for an obstruction.  The decision was made to admit the patient for observation and conservative management with possibility of surgical intervention. The patient was admitted on June 24, 2011.  An NG tube was placed to low intermittent wall suction.  The patient actually felt significantly better within the first 24 hours of her stay.  She started passing some flatus and eventually had a bowel movement.  Her x-ray showed the contrast had progressed into her right colon leading towards evidence of incomplete bowel obstruction.  Clinically again, she improved significantly, started tolerating liquid diet and subsequent solid diet and was felt appropriate for discharge home as of June 27, 2011.  DISCHARGE DIAGNOSIS:  Partial small bowel obstruction - resolved.  DISCHARGE MEDICATIONS:  The patient would resume any home medications. She was previously on and will follow up with Dr. Olevia Perches as directed.     Ascencion Dike, PA-C   ______________________________ Isabel Caprice Hassell Done, MD    KB/MEDQ  D:  07/29/2011  T:  07/30/2011  Job:  [id number]  Electronically Signed by Johnathan Hausen MD on 08/05/2011 07:24:19 AM

## 2011-09-24 ENCOUNTER — Other Ambulatory Visit: Payer: Self-pay | Admitting: *Deleted

## 2011-09-24 MED ORDER — OLMESARTAN MEDOXOMIL-HCTZ 40-12.5 MG PO TABS: 1.0000 | ORAL_TABLET | Freq: Every day | ORAL | Status: DC

## 2011-09-24 NOTE — Telephone Encounter (Signed)
Pt requesting 90-day supply of Benicar to [facility]. #90 w/3 RF 06/2011 to [facility] [[facility] also on med list]

## 2011-10-29 ENCOUNTER — Encounter: Payer: Self-pay | Admitting: Internal Medicine

## 2011-11-05 ENCOUNTER — Telehealth: Payer: Self-pay | Admitting: Internal Medicine

## 2011-11-05 NOTE — Telephone Encounter (Signed)
Pt needs to have a 3 month follow scheduled for Dec.  No appt has been scheduled.  She is asking for a 90 day supply on her meds.  I left a message for her to call us and set that up before we can give her the refills.

## 2011-11-06 ENCOUNTER — Other Ambulatory Visit: Payer: Self-pay

## 2011-11-06 MED ORDER — MERCAPTOPURINE 50 MG PO TABS: ORAL_TABLET | ORAL | Status: DC

## 2011-11-06 MED ORDER — MESALAMINE 1.2 G PO TBEC: DELAYED_RELEASE_TABLET | ORAL | Status: DC

## 2011-11-06 NOTE — Telephone Encounter (Signed)
Pt meds were sent for 1 month and follow up was scheduled

## 2011-12-02 ENCOUNTER — Other Ambulatory Visit (INDEPENDENT_AMBULATORY_CARE_PROVIDER_SITE_OTHER): Payer: 59

## 2011-12-02 ENCOUNTER — Encounter: Payer: Self-pay | Admitting: Internal Medicine

## 2011-12-02 ENCOUNTER — Ambulatory Visit (INDEPENDENT_AMBULATORY_CARE_PROVIDER_SITE_OTHER): Payer: 59 | Admitting: Internal Medicine

## 2011-12-02 VITALS — BP 130/80 | HR 72 | Ht 64.0 in | Wt 175.0 lb

## 2011-12-02 DIAGNOSIS — K5100 Ulcerative (chronic) pancolitis without complications: Secondary | ICD-10-CM

## 2011-12-02 DIAGNOSIS — Z79899 Other long term (current) drug therapy: Secondary | ICD-10-CM

## 2011-12-02 DIAGNOSIS — K625 Hemorrhage of anus and rectum: Secondary | ICD-10-CM

## 2011-12-02 LAB — CBC WITH DIFFERENTIAL/PLATELET
Basophils Relative: 0.3 % (ref 0.0–3.0)
Eosinophils Absolute: 0.1 10*3/uL (ref 0.0–0.7)
Eosinophils Relative: 1.1 % (ref 0.0–5.0)
HCT: 32.8 % — ABNORMAL LOW (ref 36.0–46.0)
Lymphs Abs: 2.7 10*3/uL (ref 0.7–4.0)
MCHC: 34 g/dL (ref 30.0–36.0)
MCV: 97.2 fl (ref 78.0–100.0)
Monocytes Absolute: 0.5 10*3/uL (ref 0.1–1.0)
Neutrophils Relative %: 48.4 % (ref 43.0–77.0)
RBC: 3.38 Mil/uL — ABNORMAL LOW (ref 3.87–5.11)

## 2011-12-02 MED ORDER — HYDROCORTISONE 100 MG/60ML RE ENEM: 100.0000 mg | ENEMA | RECTAL | Status: DC

## 2011-12-02 MED ORDER — MESALAMINE 1.2 G PO TBEC: DELAYED_RELEASE_TABLET | ORAL | Status: DC

## 2011-12-02 MED ORDER — ALPRAZOLAM 0.25 MG PO TABS: 0.2500 mg | ORAL_TABLET | Freq: Every evening | ORAL | Status: DC | PRN

## 2011-12-02 MED ORDER — MERCAPTOPURINE 50 MG PO TABS: ORAL_TABLET | ORAL | Status: DC

## 2011-12-02 MED ORDER — PREDNISONE 10 MG PO TABS: ORAL_TABLET | ORAL | Status: DC

## 2011-12-02 NOTE — Patient Instructions (Addendum)
We have sent the following medications to your pharmacy for you to pick up at your convenience: 6 mp Lialda Prednisone Xanax [place] physician has requested that you go to the basement for the following lab work before leaving today: CBC Please follow up with Dr Olevia Perches in 3 months, CC: Dr Linda Hedges

## 2011-12-02 NOTE — Progress Notes (Signed)
Latasha Wood Oct 04, 1956 MRN [id number]   History of Present Illness:  This is a 56 year old African American female with ulcerative colitis of more than 20 years duration. Her flareup started one year ago and has required several prednisone tapers. She is currently down to prednisone 10 mg daily. She is also on mesalamine 4.8 g a day and 6-MP 75 mg a day. A flexible sigmoidoscopy in June 2012 showed active colitis from 0-40 cm. She is doing reasonably well having 1-2 soft bowel movements a day with occasional blood per rectum. She denies abdominal pain. We have discussed using Humira but she has had hesitations due to the possibility of lymphoma.   Past Medical History  Diagnosis Date  . Hypertension   . Colitis, ulcerative   . Hyperlipidemia   . Colon polyps   . Blood transfusion   . Anemia    Past Surgical History  Procedure Date  . Gravida 6 para 2     all SAB  . Hyadiform mole   . Dilation and curettage of uterus     most likely after miscarriage  . Abdominal hysterectomy   . Knee arthroscopy w/ meniscal repair     right knee (Daldorf)  . Fibrocystic breast disease     q 6 month mammogram    reports that she has never smoked. She has never used smokeless tobacco. She reports that she drinks about one ounce of alcohol per week. She reports that she does not use illicit drugs. family history includes Breast cancer in her mother; Breast cancer (age of onset:32) in her sister; Colon cancer in her paternal aunt; Diabetes in her brother, father, mother, and sister; and Heart disease in her father. No Known Allergies      Review of Systems: Denies upper GI symptoms of heartburn dysphagia chest pain or shortness of breath  The remainder of the 10 point ROS is negative except as outlined in H&P   Physical Exam: General appearance  Well developed, in no distress. Eyes- non icteric. HEENT nontraumatic, normocephalic. Mouth no lesions, tongue papillated, no cheilosis. Neck  supple without adenopathy, thyroid not enlarged, no carotid bruits, no JVD. Lungs Clear to auscultation bilaterally. Cor normal S1, normal S2, regular rhythm, no murmur,  quiet precordium. Abdomen: Soft abdomen with normal active bowel sounds. Mild tenderness left lower quadrant. Rectal: Small amount of blood on the glove. Stool is Hemoccult positive Extremities no pedal edema. Skin no lesions. Neurological alert and oriented x 3. Psychological normal mood and affect.  Assessment and Plan:  Problem #1 Left sided ulcerative colitis symptomatically improved on a prednisone taper but still having blood per rectum. We will check her CBC today. She does not want to start Humira, so she will continue on prednisone 10 mg daily for one month then try to go down to 5 mg a day. We will refill oral medications. She will return in 3 months for follow up.   12/02/2011 [name]

## 2011-12-03 ENCOUNTER — Telehealth: Payer: Self-pay | Admitting: *Deleted

## 2011-12-03 NOTE — Telephone Encounter (Signed)
Message copied by Hulan Saas on Thu Dec 03, 2011 11:11 AM ------      Message from: [place] Dragon      Created: Thu Dec 03, 2011  9:38 AM       Please call pt with low CBC but improved since last time ( was 9.9, now 11.2)  Continue to take Iron

## 2011-12-03 NOTE — Telephone Encounter (Signed)
Spoke with patient and gave her results and recommendations as per Dr. Olevia Perches.

## 2012-04-23 ENCOUNTER — Ambulatory Visit (INDEPENDENT_AMBULATORY_CARE_PROVIDER_SITE_OTHER): Payer: 59 | Admitting: Internal Medicine

## 2012-04-23 VITALS — BP 142/89 | HR 85 | Temp 98.7°F | Resp 16 | Ht 64.0 in | Wt 172.0 lb

## 2012-04-23 DIAGNOSIS — R05 Cough: Secondary | ICD-10-CM

## 2012-04-23 DIAGNOSIS — J329 Chronic sinusitis, unspecified: Secondary | ICD-10-CM

## 2012-04-23 MED ORDER — HYDROCODONE-HOMATROPINE 5-1.5 MG/5ML PO SYRP: 5.0000 mL | ORAL_SOLUTION | Freq: Four times a day (QID) | ORAL | Status: AC | PRN

## 2012-04-23 MED ORDER — AMOXICILLIN 500 MG PO CAPS: 1000.0000 mg | ORAL_CAPSULE | Freq: Two times a day (BID) | ORAL | Status: AC

## 2012-04-23 MED ORDER — AMOXICILLIN 500 MG PO CAPS: 1000.0000 mg | ORAL_CAPSULE | Freq: Two times a day (BID) | ORAL | Status: DC

## 2012-04-24 NOTE — Progress Notes (Signed)
  Subjective:    Patient ID: Latasha Wood, female    DOB: July 25, 1956, 56 y.o.   MRN: [id number]  HPIc/o HA,ST,fever,congestion for 48hr Has history of bronchitis but has never needed inhalers Cough harsh with chest wall tenderness but nonproductive can't sleep due to cough    Review of Systems     Objective:   Physical Exam Overweight but otherwise vital signs stable Conjunctiva not injected TMs clear Nares with copious purulent discharge / Tender maxillary sinus to percussion on the left Throat clear No adenopathy Chest clear to auscultation/no wheezing on forced expiration       Assessment & Plan:  Problem #1 acute sinusitis Problem #2 cough secondary  Meds ordered this encounter  Medications  . Sudafed OTC              . HYDROcodone-homatropine (HYCODAN) 5-1.5 MG/5ML syrup    Sig: Take 5 mLs by mouth every 6 (six) hours as needed for cough.    Dispense:  120 mL    Refill:  0  . amoxicillin (AMOXIL) 500 MG capsule    Sig: Take 2 capsules (1,000 mg total) by mouth 2 (two) times daily.    Dispense:  40 capsule    Refill:  0

## 2012-05-03 ENCOUNTER — Telehealth: Payer: Self-pay | Admitting: Internal Medicine

## 2012-05-03 DIAGNOSIS — R197 Diarrhea, unspecified: Secondary | ICD-10-CM

## 2012-05-03 NOTE — Telephone Encounter (Signed)
Please obtain stool for C.Diff, and start Flagyl 250 mg po tidx 7 days.

## 2012-05-03 NOTE — Telephone Encounter (Signed)
Received a call from patient. She states she started Amoxicillin last weekend for a sinus infection. For the last 4-5 days, she has had cramping and diarrhea 8-10 times/day with foul odor. Denies bleeding, fever, nausea or vomiting. She states she stopped her 6 MP about a week ago when she got the cold. She is taking Lialda. She started Prednisone 20 mg  2 days ago. Wants to know if she should be on anything else. Please, advise

## 2012-05-04 ENCOUNTER — Other Ambulatory Visit: Payer: 59

## 2012-05-04 DIAGNOSIS — R197 Diarrhea, unspecified: Secondary | ICD-10-CM

## 2012-05-04 MED ORDER — METRONIDAZOLE 250 MG PO TABS: 250.0000 mg | ORAL_TABLET | Freq: Three times a day (TID) | ORAL | Status: AC

## 2012-05-04 NOTE — Telephone Encounter (Signed)
Patient given Dr. Nichola Sizer recommendations. Patient will come today and give a specimen. She understands to start Flagyl after giving specimen.

## 2012-05-05 ENCOUNTER — Encounter: Payer: Self-pay | Admitting: *Deleted

## 2012-05-05 ENCOUNTER — Telehealth: Payer: Self-pay | Admitting: Internal Medicine

## 2012-05-05 MED ORDER — DICYCLOMINE HCL 20 MG PO TABS: ORAL_TABLET | ORAL | Status: DC

## 2012-05-05 NOTE — Telephone Encounter (Signed)
OK for out of work letter

## 2012-05-05 NOTE — Telephone Encounter (Signed)
Letter up front for pick up. Patient notified.

## 2012-05-05 NOTE — Telephone Encounter (Signed)
Rx sent and patient aware. She states she will need a letter for work since she has been out of work due to diarrhea this week and for amount of time she should be out. [place] for letter for this week?

## 2012-05-05 NOTE — Telephone Encounter (Signed)
Spoke with and she gave the stool specimen yesterday(still pending results) she will start Flagyl today. She wants to know if she can get something for the cramping. Please, advise.

## 2012-05-05 NOTE — Telephone Encounter (Signed)
Bentyl 20 mg, #30, 1 po bid for cramps, 1 refill

## 2012-05-09 ENCOUNTER — Telehealth: Payer: Self-pay | Admitting: Internal Medicine

## 2012-05-09 NOTE — Telephone Encounter (Signed)
Patient calling to report she was a little better yesterday with diarrhea but when she tried to go to work the watery, diarrhea started back. States she has had 4-5 watery diarrhea stools this AM already. She is taking Prednisone 20 mg, 6 MP, Lialda, Bentyl, and Flagyl. Cramping has stopped. She is feeling washed out. C. Diff neg last week. Hx UC. Scheduled patient on 05/10/12 at 8:45 AM with Dr. Olevia Perches.

## 2012-05-10 ENCOUNTER — Ambulatory Visit (INDEPENDENT_AMBULATORY_CARE_PROVIDER_SITE_OTHER): Payer: 59 | Admitting: Internal Medicine

## 2012-05-10 ENCOUNTER — Ambulatory Visit: Payer: 59 | Admitting: Internal Medicine

## 2012-05-10 ENCOUNTER — Encounter: Payer: Self-pay | Admitting: Internal Medicine

## 2012-05-10 ENCOUNTER — Other Ambulatory Visit (INDEPENDENT_AMBULATORY_CARE_PROVIDER_SITE_OTHER): Payer: 59

## 2012-05-10 VITALS — BP 136/80 | HR 72 | Temp 98.7°F | Ht 64.0 in | Wt 176.0 lb

## 2012-05-10 DIAGNOSIS — R197 Diarrhea, unspecified: Secondary | ICD-10-CM

## 2012-05-10 DIAGNOSIS — K5190 Ulcerative colitis, unspecified, without complications: Secondary | ICD-10-CM

## 2012-05-10 LAB — CBC WITH DIFFERENTIAL/PLATELET
Basophils Absolute: 0 10*3/uL (ref 0.0–0.1)
Eosinophils Absolute: 0 10*3/uL (ref 0.0–0.7)
Eosinophils Relative: 0.4 % (ref 0.0–5.0)
HCT: 35 % — ABNORMAL LOW (ref 36.0–46.0)
Lymphs Abs: 3.6 10*3/uL (ref 0.7–4.0)
MCHC: 33.5 g/dL (ref 30.0–36.0)
MCV: 94.9 fl (ref 78.0–100.0)
Monocytes Absolute: 0.7 10*3/uL (ref 0.1–1.0)
Neutrophils Relative %: 55 % (ref 43.0–77.0)
Platelets: 328 10*3/uL (ref 150.0–400.0)
RDW: 13.1 % (ref 11.5–14.6)
WBC: 9.7 10*3/uL (ref 4.5–10.5)

## 2012-05-10 LAB — COMPREHENSIVE METABOLIC PANEL
ALT: 14 U/L (ref 0–35)
AST: 17 U/L (ref 0–37)
Alkaline Phosphatase: 69 U/L (ref 39–117)
CO2: 28 mEq/L (ref 19–32)
Creatinine, Ser: 0.9 mg/dL (ref 0.4–1.2)
GFR: 85.13 mL/min (ref >60.00)
Sodium: 139 mEq/L (ref 135–145)
Total Bilirubin: 0.6 mg/dL (ref 0.3–1.2)
Total Protein: 7.3 g/dL (ref 6.0–8.3)

## 2012-05-10 LAB — SEDIMENTATION RATE: Sed Rate: 27 mm/hr — ABNORMAL HIGH (ref 0–22)

## 2012-05-10 MED ORDER — PREDNISONE 10 MG PO TABS: ORAL_TABLET | ORAL | Status: DC

## 2012-05-10 MED ORDER — ALIGN 4 MG PO CAPS: 1.0000 | ORAL_CAPSULE | Freq: Every day | ORAL | Status: DC

## 2012-05-10 NOTE — Patient Instructions (Addendum)
We have sent the following medications to your pharmacy for you to pick up at your convenience: Prednisone (you will be taking 30 mg daily) We have given you samples of Align. This puts good bacteria back into your colon. You should take 1 capsule by mouth once daily. If this works well for you, it can be purchased over the counter. We have sent the following medications to your pharmacy for you to pick up at your convenience: CBC, Sed Rate, CMET, TSH We have given you a work note until 05/24/12. Follow up with Dr Olevia Perches in 2 weeks. CC: Dr Adella Hare

## 2012-05-10 NOTE — Progress Notes (Signed)
Latasha Wood 04/01/1956 MRN [id number]    History of Present Illness:  This is a 56 year old African American female with ulcerative colitis since the 1980s with intermittent flareups this past year. The most recent flareup was precipitated by a course of amoxicillin for an upper respiratory infection. She was at that point on prednisone 10 mg daily and she increased the dose to 20 mg a day. Her C. difficile toxin was negative. She was started on Flagyl 250 mg 3 times a day for antibiotic related diarrhea. Her stools are no longer watery but soft. She still has urgency and frequency. She feels very tired and has not been able to work now for a week. Her last flexible sigmoidoscopy in June 2012 showed active colitis from 0-40 cm. We have been discussing treatment with biologics but she has declined Humira. She has mild anemia. The last hemoglobin in January 2013 was 11.2 and hematocrit of 32.8. She has been on mesalamine 4.8 g daily and 6 MP 75 mg a day.   Past Medical History  Diagnosis Date  . Hypertension   . Colitis, ulcerative   . Hyperlipidemia   . Colon polyps   . Blood transfusion   . Anemia    Past Surgical History  Procedure Date  . Gravida 6 para 2     all SAB  . Hyadiform mole   . Dilation and curettage of uterus     most likely after miscarriage  . Abdominal hysterectomy   . Knee arthroscopy w/ meniscal repair     right knee (Daldorf)  . Fibrocystic breast disease     q 6 month mammogram    reports that she has never smoked. She has never used smokeless tobacco. She reports that she drinks about one ounce of alcohol per week. She reports that she does not use illicit drugs. family history includes Breast cancer in her mother; Breast cancer (age of onset:32) in her sister; Colon cancer in her paternal aunt; Diabetes in her brother, father, mother, and sister; and Heart disease in her father. No Known Allergies      Review of Systems: Denies nausea vomiting  dysphagia  The remainder of the 10 point ROS is negative except as outlined in H&P   Physical Exam: General appearance  Well developed, in no distress. Eyes- non icteric. HEENT nontraumatic, normocephalic. Mouth no lesions, tongue papillated, no cheilosis. Neck supple without adenopathy, thyroid not enlarged, no carotid bruits, no JVD. Lungs Clear to auscultation bilaterally. Cor normal S1, normal S2, regular rhythm, no murmur,  quiet precordium. Abdomen: Mild tenderness left lower quadrant. Soft abdomen with normoactive bowel sounds. No distention. Rectal: Small amount of Hemoccult negative stool. Extremities no pedal edema. Skin no lesions. Neurological alert and oriented x 3. Psychological normal mood and affect.  Assessment and Plan:  Problem #1 Chronic left-sided ulcerative colitis with frequent flareups. We will have to go up on the prednisone to 30 mg daily. She will continue on mesalamine 4.8 g a day and use cort enemas which she has at home. We will give her 2 weeks off work and obtain some blood tests today including a blood count, metabolic panel and TSH. I need to see her in 2 weeks or at least connect with her to decide if she can return to work in 2 weeks. We will consider a colonoscopy if she doesn't respond to the current regimen.   05/10/2012 [name]

## 2012-06-08 ENCOUNTER — Telehealth: Payer: Self-pay | Admitting: Internal Medicine

## 2012-06-08 MED ORDER — MESALAMINE 1.2 G PO TBEC: DELAYED_RELEASE_TABLET | ORAL | Status: DC

## 2012-06-08 MED ORDER — MERCAPTOPURINE 50 MG PO TABS: ORAL_TABLET | ORAL | Status: DC

## 2012-06-08 NOTE — Telephone Encounter (Signed)
rx sent

## 2012-09-05 ENCOUNTER — Encounter: Payer: Self-pay | Admitting: Internal Medicine

## 2012-09-05 ENCOUNTER — Other Ambulatory Visit (INDEPENDENT_AMBULATORY_CARE_PROVIDER_SITE_OTHER): Payer: 59

## 2012-09-05 ENCOUNTER — Ambulatory Visit (INDEPENDENT_AMBULATORY_CARE_PROVIDER_SITE_OTHER): Payer: 59 | Admitting: Internal Medicine

## 2012-09-05 VITALS — BP 130/80 | HR 89 | Temp 99.0°F | Resp 16 | Wt 180.0 lb

## 2012-09-05 DIAGNOSIS — F419 Anxiety disorder, unspecified: Secondary | ICD-10-CM

## 2012-09-05 DIAGNOSIS — I10 Essential (primary) hypertension: Secondary | ICD-10-CM

## 2012-09-05 DIAGNOSIS — Z23 Encounter for immunization: Secondary | ICD-10-CM

## 2012-09-05 DIAGNOSIS — E785 Hyperlipidemia, unspecified: Secondary | ICD-10-CM

## 2012-09-05 DIAGNOSIS — K5190 Ulcerative colitis, unspecified, without complications: Secondary | ICD-10-CM

## 2012-09-05 DIAGNOSIS — Z0000 Encounter for general adult medical examination without abnormal findings: Secondary | ICD-10-CM

## 2012-09-05 DIAGNOSIS — F411 Generalized anxiety disorder: Secondary | ICD-10-CM

## 2012-09-05 LAB — COMPREHENSIVE METABOLIC PANEL WITH GFR
ALT: 15 U/L (ref 0–35)
AST: 21 U/L (ref 0–37)
Albumin: 3.3 g/dL — ABNORMAL LOW (ref 3.5–5.2)
Alkaline Phosphatase: 59 U/L (ref 39–117)
BUN: 12 mg/dL (ref 6–23)
CO2: 26 meq/L (ref 19–32)
Calcium: 8.9 mg/dL (ref 8.4–10.5)
Chloride: 106 meq/L (ref 96–112)
Creatinine, Ser: 0.9 mg/dL (ref 0.4–1.2)
GFR: 86.16 mL/min
Glucose, Bld: 89 mg/dL (ref 70–99)
Potassium: 3.6 meq/L (ref 3.5–5.1)
Sodium: 138 meq/L (ref 135–145)
Total Bilirubin: 0.7 mg/dL (ref 0.3–1.2)
Total Protein: 6.7 g/dL (ref 6.0–8.3)

## 2012-09-05 LAB — CBC WITH DIFFERENTIAL/PLATELET
Basophils Relative: 0.1 % (ref 0.0–3.0)
Eosinophils Absolute: 0 10*3/uL (ref 0.0–0.7)
Eosinophils Relative: 0.8 % (ref 0.0–5.0)
HCT: 34.3 % — ABNORMAL LOW (ref 36.0–46.0)
Hemoglobin: 11.4 g/dL — ABNORMAL LOW (ref 12.0–15.0)
MCHC: 33.3 g/dL (ref 30.0–36.0)
MCV: 100.9 fl — ABNORMAL HIGH (ref 78.0–100.0)
Monocytes Absolute: 0.5 10*3/uL (ref 0.1–1.0)
Neutro Abs: 3.1 10*3/uL (ref 1.4–7.7)
RBC: 3.4 Mil/uL — ABNORMAL LOW (ref 3.87–5.11)
WBC: 5.4 10*3/uL (ref 4.5–10.5)

## 2012-09-05 LAB — LDL CHOLESTEROL, DIRECT: Direct LDL: 157.8 mg/dL

## 2012-09-05 MED ORDER — OLMESARTAN MEDOXOMIL-HCTZ 40-12.5 MG PO TABS: 1.0000 | ORAL_TABLET | Freq: Every day | ORAL | Status: DC

## 2012-09-05 NOTE — Patient Instructions (Addendum)
Good to see you. Don't go over the moon to far over the cowboy boot wearing grandson!!  Heel pain is most likely plantar fasciitis - se below. You can also go to YouTube.com for videos on stretches that will help.   Exam today is normal. Labs will be report to you either by letter or MyChart\  Plantar Fasciitis Plantar fasciitis is a common condition that causes foot pain. It is soreness (inflammation) of the band of tough fibrous tissue on the bottom of the foot that runs from the heel bone (calcaneus) to the ball of the foot. The cause of this soreness may be from excessive standing, poor fitting shoes, running on hard surfaces, being overweight, having an abnormal walk, or overuse (this is common in runners) of the painful foot or feet. It is also common in aerobic exercise dancers and ballet dancers. SYMPTOMS   Most people with plantar fasciitis complain of:  Severe pain in the morning on the bottom of their foot especially when taking the first steps out of bed. This pain recedes after a few minutes of walking.   Severe pain is experienced also during walking following a long period of inactivity.   Pain is worse when walking barefoot or up stairs  DIAGNOSIS    Your caregiver will diagnose this condition by examining and feeling your foot.   Special tests such as X-rays of your foot, are usually not needed.  PREVENTION    Consult a sports medicine professional before beginning a new exercise program.   Walking programs offer a good workout. With walking there is a lower chance of overuse injuries common to runners. There is less impact and less jarring of the joints.   Begin all new exercise programs slowly. If problems or pain develop, decrease the amount of time or distance until you are at a comfortable level.   Wear good shoes and replace them regularly.   Stretch your foot and the heel cords at the back of the ankle (Achilles tendon) both before and after exercise.   Run or  exercise on even surfaces that are not hard. For example, asphalt is better than pavement.   Do not run barefoot on hard surfaces.   If using a treadmill, vary the incline.   Do not continue to workout if you have foot or joint problems. Seek professional help if they do not improve.  HOME CARE INSTRUCTIONS    Avoid activities that cause you pain until you recover.   Use ice or cold packs on the problem or painful areas after working out.   Only take over-the-counter or prescription medicines for pain, discomfort, or fever as directed by your caregiver.   Soft shoe inserts or athletic shoes with air or gel sole cushions may be helpful.   If problems continue or become more severe, consult a sports medicine caregiver or your own health care provider. Cortisone is a potent anti-inflammatory medication that may be injected into the painful area. You can discuss this treatment with your caregiver.  MAKE SURE YOU:    Understand these instructions.   Will watch your condition.   Will get help right away if you are not doing well or get worse.  Document Released: 07/21/2001 Document Revised: 01/18/2012 Document Reviewed: 09/19/2008 [facility] Patient Information 2013 [place].

## 2012-09-05 NOTE — Progress Notes (Signed)
Subjective:    Patient ID: Latasha Wood, female    DOB: Sep 21, 1956, 56 y.o.   MRN: [id number]  HPI Latasha Wood preents for routine follow up for hypertension and lipids with a history ok LDL and very high HDL. She also is complaining right heel pain, worse with weight bearing that has been present for several months. She also has chronic itching of the back and her husband is concerned about some skin discoloration.   She follows closely with Dr. Olevia Perches for her colitis which is currently stable and under control.  She is s/p hysterectomy. She will be seeing her gynecologist before the end of the year.   Past Medical History  Diagnosis Date  . Hypertension   . Colitis, ulcerative   . Hyperlipidemia   . Colon polyps   . Blood transfusion   . Anemia    Past Surgical History  Procedure Date  . Gravida 6 para 2     all SAB  . Hyadiform mole   . Dilation and curettage of uterus     most likely after miscarriage  . Knee arthroscopy w/ meniscal repair     right knee (Daldorf)  . Fibrocystic breast disease     q 6 month mammogram  . Abdominal hysterectomy 2000   Family History  Problem Relation Age of Onset  . Diabetes Mother   . Breast cancer Mother   . Diabetes Father   . Heart disease Father   . Diabetes Sister   . Colon cancer Paternal Aunt   . Diabetes Brother   . Breast cancer Sister 32   History   Social History  . Marital Status: Married    Spouse Name: N/A    Number of Children: 2  . Years of Education: 18   Occupational History  . customer relations     [employer]   Social History Main Topics  . Smoking status: Never Smoker   . Smokeless tobacco: Never Used  . Alcohol Use: 1.0 oz/week    2 drink(s) per week     occ  . Drug Use: No  . Sexually Active: Yes -- Female partner(s)   Other Topics Concern  . Not on file   Social History Narrative   HSG, [employer] - BA, [employer] for med tech.  Married '75. 2 dtrs - '79, '82; 1 grandchild. Marriage is  in good health. No abuse issues.     Current Outpatient Prescriptions on File Prior to Visit  Medication Sig Dispense Refill  . ALPRAZolam (XANAX) 0.25 MG tablet Take 1 tablet (0.25 mg total) by mouth at bedtime as needed.  30 tablet  0  . calcium-vitamin D (OSCAL WITH D) 500-200 MG-UNIT per tablet Take 1 tablet by mouth daily.        Marland Kitchen dicyclomine (BENTYL) 20 MG tablet One po BID for cramps  30 tablet  1  . mercaptopurine (PURINETHOL) 50 MG tablet Take 1.5 tablets (75 mg) by mouth once daily.  135 tablet  0  . mesalamine (LIALDA) 1.2 G EC tablet Take 2 tablets by mouth every morning and 2 tablets by mouth every evening.  360 tablet  0  . predniSONE (DELTASONE) 10 MG tablet Take as directed  100 tablet  1  . Probiotic Product (ALIGN) 4 MG CAPS Take 1 capsule by mouth daily.  14 capsule  0  . DISCONTD: olmesartan-hydrochlorothiazide (BENICAR HCT) 40-12.5 MG per tablet Take 1 tablet by mouth daily.  90 tablet  3  Review of Systems Constitutional:  Negative for fever, chills, activity change and unexpected weight change.  HEENT:  Negative for hearing loss, ear pain, congestion, neck stiffness and postnasal drip. Negative for sore throat or swallowing problems. Negative for dental complaints.   Eyes: Negative for vision loss or change in visual acuity.  Respiratory: Negative for chest tightness and wheezing. Negative for DOE.   Cardiovascular: Negative for chest pain or palpitations. No decreased exercise tolerance Gastrointestinal: No change in bowel habit. No bloating or gas. No reflux or indigestion Genitourinary: Negative for urgency, frequency, flank pain and difficulty urinating.  Musculoskeletal: Negative for myalgias, back pain, arthralgias and gait problem.  Neurological: Negative for dizziness, tremors, weakness and headaches.  Hematological: Negative for adenopathy.  Psychiatric/Behavioral: Negative for behavioral problems and dysphoric mood.       Objective:   Physical  Exam Filed Vitals:   09/05/12 1021  BP: 130/80  Pulse: 89  Temp: 99 F (37.2 C)  Resp: 16   Wt Readings from Last 3 Encounters:  09/05/12 180 lb (81.647 kg)  05/10/12 176 lb (79.833 kg)  04/23/12 172 lb (78.019 kg)   Gen'l: well nourished, well developed AA Woman in no distress HEENT - /AT, EACs/TMs normal, oropharynx with native dentition in good condition, no buccal or palatal lesions, posterior pharynx clear, mucous membranes moist. C&S clear, PERRLA, fundi - normal Neck - supple, no thyromegaly Nodes- negative submental, cervical, supraclavicular regions Chest - no deformity, no CVAT Lungs - cleat without rales, wheezes. No increased work of breathing Breast - deferred to gyn Cardiovascular - regular rate and rhythm, quiet precordium, no murmurs, rubs or gallops, 2+ radial, DP and PT pulses Abdomen - BS+ x 4, no HSM, no guarding or rebound or tenderness Pelvic - deferred to gyn Rectal - deferred to gyn Extremities - no clubbing, cyanosis, edema or deformity. Point tenderness center of right heel.  Neuro - A&O x 3, CN II-XII normal, motor strength normal and equal, DTRs 2+ and symmetrical biceps, radial, and patellar tendons. Cerebellar - no tremor, no rigidity, fluid movement and normal gait. Derm - Head, neck, back, abdomen and extremities without suspicious lesions. Back without suspicious lesions or rash  Lab Results  Component Value Date   WBC 5.4 09/05/2012   HGB 11.4* 09/05/2012   HCT 34.3* 09/05/2012   PLT 305.0 09/05/2012   GLUCOSE 89 09/05/2012   CHOL 260* 09/05/2012   TRIG 82.0 09/05/2012   HDL 79.70 09/05/2012   LDLDIRECT 157.8 09/05/2012   ALT 15 09/05/2012   AST 21 09/05/2012   NA 138 09/05/2012   K 3.6 09/05/2012   CL 106 09/05/2012   CREATININE 0.9 09/05/2012   BUN 12 09/05/2012   CO2 26 09/05/2012   TSH 1.31 05/10/2012         Assessment & Plan:

## 2012-09-06 DIAGNOSIS — Z0000 Encounter for general adult medical examination without abnormal findings: Secondary | ICD-10-CM | POA: Insufficient documentation

## 2012-09-06 NOTE — Assessment & Plan Note (Signed)
BP Readings from Last 3 Encounters:  09/05/12 130/80  05/10/12 136/80  04/23/12 142/89   Adequate control on present regimen. Labs are OK  Plan - continue present regimen

## 2012-09-06 NOTE — Assessment & Plan Note (Signed)
LDL not a goal of 130 or less but just below mandatory treatment threshold.  Plan  3 months of life-style management: low fat diet and regular aerobic exercise 3 times a week for 30 minutes.  Follow-up lipid panel in 3 month - if not at goal will recommend medical therapy

## 2012-09-06 NOTE — Assessment & Plan Note (Signed)
Seems stable at this time. She is managed by Dr. Olevia Perches. She is currently on low dose prednisone as well as mesalamine and mercaptopurine

## 2012-09-06 NOTE — Assessment & Plan Note (Signed)
Doing pretty well. She is taking low dose alprazolam qHS and prn.

## 2012-09-06 NOTE — Assessment & Plan Note (Signed)
Interval medical history is stable. Physical exam, sans breast and pelvic, is ok except for weight. She is current with her gynecologist. She is current with colorectal and breast cancer screening. Immunizations - tetanus today. Given her use of steroids she is a candidate for pneumonia vaccine at her convenience.   In summary - a very nice woman who appears to be medically stable at this time but needs to address her cholesterol levels- diet and exercise as first choice.

## 2012-09-09 ENCOUNTER — Encounter: Payer: Self-pay | Admitting: Internal Medicine

## 2012-09-09 DIAGNOSIS — E785 Hyperlipidemia, unspecified: Secondary | ICD-10-CM

## 2012-10-19 ENCOUNTER — Encounter: Payer: Self-pay | Admitting: Internal Medicine

## 2012-10-19 ENCOUNTER — Ambulatory Visit (INDEPENDENT_AMBULATORY_CARE_PROVIDER_SITE_OTHER): Payer: 59 | Admitting: Internal Medicine

## 2012-10-19 VITALS — BP 124/84 | HR 84 | Ht 64.0 in | Wt 182.4 lb

## 2012-10-19 DIAGNOSIS — D509 Iron deficiency anemia, unspecified: Secondary | ICD-10-CM

## 2012-10-19 DIAGNOSIS — K5100 Ulcerative (chronic) pancolitis without complications: Secondary | ICD-10-CM

## 2012-10-19 MED ORDER — MERCAPTOPURINE 50 MG PO TABS: ORAL_TABLET | ORAL | Status: DC

## 2012-10-19 MED ORDER — PREDNISONE 5 MG PO TABS: ORAL_TABLET | ORAL | Status: DC

## 2012-10-19 MED ORDER — ALPRAZOLAM 0.25 MG PO TABS: 0.2500 mg | ORAL_TABLET | Freq: Every evening | ORAL | Status: DC | PRN

## 2012-10-19 MED ORDER — MESALAMINE 1.2 G PO TBEC: DELAYED_RELEASE_TABLET | ORAL | Status: DC

## 2012-10-19 MED ORDER — ADALIMUMAB 40 MG/0.8ML ~~LOC~~ KIT: 40.0000 mg | PACK | Freq: Once | SUBCUTANEOUS | Status: DC

## 2012-10-19 NOTE — Patient Instructions (Addendum)
We have sent the following medications to your pharmacy for you to pick up at your convenience: Prednisone 5 mg (2 tablets daily) Xanax  We have sent the following medications to your pharmacy : Lialda 6 mp  We have given you a TB skin test today. Please make certain to come back to the office for a reading between 48-72 hours from now to avoid requiring repeat testing.  We will get you set up to receive Humira.  CC: Dr Adella Hare

## 2012-10-19 NOTE — Progress Notes (Signed)
Latasha Wood [birth year] MRN [id number]  History of Present Illness:  This is a 55 year old African American female with ulcerative colitis since 1980 and  recent flareup. She finally responded to  prednisone taper but it took several months for her to feel better. She Is currently on 6 MP 50 mg daily, mesalamine 4.8 g daily and is having normal bowel movements. She discontinued her prednisone 10 mg daily about 3 weeks ago when she ran out of it. She is now having joint pains. She denies abdominal pain or rectal bleeding. Her last flexible sigmoidoscopy in June 2012 showed active colitis from 0-40 cm. She has been recently discussing the use of biologicals for treatment. She initially declined after reading the side effects of the Humira but today she is willing to give it a try. She will have a skin test today. She has already watched the tape on Humira.  Past Medical History  Diagnosis Date  . Hypertension   . Colitis, ulcerative   . Hyperlipidemia   . Colon polyps   . Blood transfusion   . Anemia    Past Surgical History  Procedure Date  . Gravida 6 para 2     all SAB  . Hyadiform mole   . Dilation and curettage of uterus     most likely after miscarriage  . Knee arthroscopy w/ meniscal repair     right knee (Daldorf)  . Fibrocystic breast disease     q 6 month mammogram  . Abdominal hysterectomy 2000    reports that she has never smoked. She has never used smokeless tobacco. She reports that she drinks about one ounce of alcohol per week. She reports that she does not use illicit drugs. family history includes Breast cancer in her mother; Breast cancer (age of onset:32) in her sister; Colon cancer in her paternal aunt; Diabetes in her brother, father, mother, and sister; and Heart disease in her father. No Known Allergies      Review of Systems: Weight gain. Denies dysphagia odynophagia  The remainder of the 10 point ROS is negative except as outlined in H&P   Physical  Exam: General appearance  Well developed, in no distress. Eyes- non icteric. HEENT nontraumatic, normocephalic. Mouth no lesions, tongue papillated, no cheilosis. Neck supple without adenopathy, thyroid not enlarged, no carotid bruits, no JVD. Lungs Clear to auscultation bilaterally. Cor normal S1, normal S2, regular rhythm, no murmur,  quiet precordium. Abdomen: Soft nontender normoactive bowel sounds. Rectal: Not done. Extremities no pedal edema. Skin no lesions. Neurological alert and oriented x 3. Psychological normal mood and affect.  Assessment and Plan:  Problem #1 Ulcerative colitis responded to prednisone taper. She had a rather severe flareup and we suggested that she starts biologicals. We will hopefully be able to taper down her immunomodulators as well as her prednisone. We will start the precertification process. She is up-to-date on her blood tests and colonoscopy. She will start prednisone 5 mg daily.   10/19/2012 Latasha Wood

## 2012-10-21 ENCOUNTER — Other Ambulatory Visit: Payer: Self-pay | Admitting: Internal Medicine

## 2012-10-21 MED ORDER — ADALIMUMAB 40 MG/0.8ML ~~LOC~~ KIT: PACK | SUBCUTANEOUS | Status: DC

## 2012-11-07 ENCOUNTER — Ambulatory Visit (INDEPENDENT_AMBULATORY_CARE_PROVIDER_SITE_OTHER): Payer: 59 | Admitting: Internal Medicine

## 2012-11-07 DIAGNOSIS — K5190 Ulcerative colitis, unspecified, without complications: Secondary | ICD-10-CM

## 2012-11-07 MED ORDER — TUBERCULIN PPD 5 UNIT/0.1ML ID SOLN
5.0000 [IU] | Freq: Once | INTRADERMAL | Status: AC
  Administered 2013-12-06: 5 [IU] via INTRADERMAL

## 2012-11-10 ENCOUNTER — Telehealth: Payer: Self-pay | Admitting: *Deleted

## 2012-11-10 LAB — TB SKIN TEST: Induration: 0 mm

## 2012-11-10 NOTE — Telephone Encounter (Signed)
Per patient, she has not heard anything from [facility] regarding her Humira starter kit that we sent a prescription for on 10/11/12 @ 2:47 pm. There was also a "medical clarification" form returned to [facility] on 10/21/12. I have spoken to [facility] and she states that they have been attempting to contact patient by automated system. However, this often shows as a blocked or unavailable number and the wrong number was in the system for patient. Ubaldo Glassing states that a prior authorization is needed. I was transferred to Vicente Males in the specialty pharmacy prior authorization department and have initiated a PA (patient ID [id number]). Advised that patient has tried steroids, 6MP and mesalamine products for ulcerative colitis without control of symptoms. Prior Josem Kaufmann number is PA [id number] and the PA will go for clinical review with a decision between 24-72 hours.

## 2012-11-11 NOTE — Telephone Encounter (Signed)
Per [facility], patient has been approved for Humira Starter Kit through 02/03/13. Patient advised. She will contact us for teaching once her starter kit has arrived. See scanned document for approval under "media."

## 2012-11-11 NOTE — Telephone Encounter (Signed)
Reviewed and agree. Thanx DB

## 2012-11-21 ENCOUNTER — Encounter: Payer: Self-pay | Admitting: Internal Medicine

## 2012-11-21 ENCOUNTER — Ambulatory Visit (INDEPENDENT_AMBULATORY_CARE_PROVIDER_SITE_OTHER): Payer: 59 | Admitting: Internal Medicine

## 2012-11-21 ENCOUNTER — Other Ambulatory Visit: Payer: Self-pay | Admitting: Internal Medicine

## 2012-11-21 VITALS — BP 148/98 | HR 84 | Temp 98.4°F | Resp 12 | Wt 184.1 lb

## 2012-11-21 DIAGNOSIS — L089 Local infection of the skin and subcutaneous tissue, unspecified: Secondary | ICD-10-CM

## 2012-11-21 DIAGNOSIS — A499 Bacterial infection, unspecified: Secondary | ICD-10-CM

## 2012-11-21 DIAGNOSIS — B9689 Other specified bacterial agents as the cause of diseases classified elsewhere: Secondary | ICD-10-CM

## 2012-11-21 MED ORDER — ADALIMUMAB 40 MG/0.8ML ~~LOC~~ KIT: 40.0000 mg | PACK | SUBCUTANEOUS | Status: DC

## 2012-11-21 MED ORDER — SULFAMETHOXAZOLE-TRIMETHOPRIM 800-160 MG PO TABS: 1.0000 | ORAL_TABLET | Freq: Two times a day (BID) | ORAL | Status: DC

## 2012-11-21 NOTE — Progress Notes (Signed)
  Subjective:    Patient ID: Latasha Wood, female    DOB: December 02, 1955, 57 y.o.   MRN: [id number]  HPI Ms. Guizar presents with a 10-14 day history of a lesion at the glabella which is red, has been draining a clear fluid and the surrounding tissue is mildly indurated. The area is painful. She has not had any fever.     Review of Systems     Objective:   Physical Exam Filed Vitals:   11/21/12 1619  BP: 148/98  Pulse: 84  Temp: 98.4 F (36.9 C)  Resp: 12   Cor- 2+ radial pulse PUlm - normal respirations Neuor - A&O x 3 Derm - 2 mm open red lesion glabella.       Assessment & Plan:  Skin infection - small cystic lesion that is erythematous, indurated, painful and with some drainage.  Plan Septra DS bid x 7 days  Warm compresses to the lesion twice a day  Do not manipulate

## 2012-11-21 NOTE — Patient Instructions (Addendum)
Skin infection - small cystic lesion that is erythematous, indurated, painful and with some drainage.  Plan Septra DS bid x 7 days  Warm compresses to the lesion twice a day  Do not manipulate

## 2012-12-30 ENCOUNTER — Encounter: Payer: Self-pay | Admitting: Internal Medicine

## 2013-02-17 ENCOUNTER — Telehealth: Payer: Self-pay | Admitting: Internal Medicine

## 2013-02-17 NOTE — Telephone Encounter (Signed)
Patient needs prior authorization for Humira maintenance medication. She was already approved for [employer] which she has taken.  Patient has ulcerative colitis (556.6) and has tried prednisone, Lialda, Asacol, mercaptopurine, Flagyl, cort enemas and sulfasalazine in the past. I have spoken to [place] @ patient's insurance and patient has been approved for Humira from today- 02/18/15. Auth: [id number]. I have advised patient of approval.

## 2013-03-09 ENCOUNTER — Telehealth: Payer: Self-pay | Admitting: Internal Medicine

## 2013-03-10 MED ORDER — PREDNISONE 5 MG PO TABS: ORAL_TABLET | ORAL | Status: DC

## 2013-03-10 NOTE — Telephone Encounter (Signed)
Pt aware and refill sent to the pharmacy.

## 2013-03-10 NOTE — Telephone Encounter (Signed)
OK to refill Prednisone, start at 55m/day x 1-2 weeks, then taper down by 5 mg every week. Give [place]aa follow up  If no better,

## 2013-03-10 NOTE — Addendum Note (Signed)
Addended by: Rosanne Sack R on: 03/10/2013 12:54 PM   Modules accepted: Orders

## 2013-03-10 NOTE — Telephone Encounter (Signed)
Pt states she is having a flare. States she has some urgency and diarrhea. States there was some blood in her stool several days ago but not now. Offered pt an appt today but she states she is going out of town. Pt would like another prescription for prednisone. States she can ususally control her symptoms when she takes the prednisone for a little while. Is it ok to refill the prednisone? Please advise.

## 2013-03-21 ENCOUNTER — Telehealth: Payer: Self-pay | Admitting: Internal Medicine

## 2013-03-22 NOTE — Telephone Encounter (Signed)
Left a message for patient that I faxed Rx this AM for Humira.

## 2013-04-21 ENCOUNTER — Encounter: Payer: Self-pay | Admitting: Internal Medicine

## 2013-04-21 ENCOUNTER — Ambulatory Visit (INDEPENDENT_AMBULATORY_CARE_PROVIDER_SITE_OTHER): Payer: 59 | Admitting: Internal Medicine

## 2013-04-21 ENCOUNTER — Other Ambulatory Visit (INDEPENDENT_AMBULATORY_CARE_PROVIDER_SITE_OTHER): Payer: 59

## 2013-04-21 DIAGNOSIS — K5100 Ulcerative (chronic) pancolitis without complications: Secondary | ICD-10-CM

## 2013-04-21 DIAGNOSIS — K5190 Ulcerative colitis, unspecified, without complications: Secondary | ICD-10-CM

## 2013-04-21 DIAGNOSIS — D849 Immunodeficiency, unspecified: Secondary | ICD-10-CM

## 2013-04-21 LAB — CBC WITH DIFFERENTIAL/PLATELET
Eosinophils Absolute: 0 10*3/uL (ref 0.0–0.7)
Lymphocytes Relative: 17.3 % (ref 12.0–46.0)
MCHC: 33.3 g/dL (ref 30.0–36.0)
MCV: 94.1 fl (ref 78.0–100.0)
Monocytes Absolute: 0.3 10*3/uL (ref 0.1–1.0)
Neutrophils Relative %: 79.3 % — ABNORMAL HIGH (ref 43.0–77.0)
Platelets: 304 10*3/uL (ref 150.0–400.0)
WBC: 12.5 10*3/uL — ABNORMAL HIGH (ref 4.5–10.5)

## 2013-04-21 LAB — COMPREHENSIVE METABOLIC PANEL
ALT: 20 U/L (ref 0–35)
AST: 16 U/L (ref 0–37)
Albumin: 3.6 g/dL (ref 3.5–5.2)
Alkaline Phosphatase: 79 U/L (ref 39–117)
Potassium: 3.8 mEq/L (ref 3.5–5.1)
Sodium: 142 mEq/L (ref 135–145)
Total Protein: 6.8 g/dL (ref 6.0–8.3)

## 2013-04-21 MED ORDER — HYDROCORTISONE ACE-PRAMOXINE 2.5-1 % RE CREA: TOPICAL_CREAM | Freq: Two times a day (BID) | RECTAL | Status: DC

## 2013-04-21 MED ORDER — ALPRAZOLAM 0.25 MG PO TABS: 0.2500 mg | ORAL_TABLET | Freq: Every evening | ORAL | Status: DC | PRN

## 2013-04-21 MED ORDER — PREDNISONE 10 MG PO TABS: ORAL_TABLET | ORAL | Status: DC

## 2013-04-21 NOTE — Patient Instructions (Addendum)
Your physician has requested that you go to the basement for the following lab work before leaving today: CBC, Cmet, sed rate, Hepatitis A total, Hepatitis B surface antigen, Hepatitis B surface antibody.  We have sent the following medications to your pharmacy for you to pick up at your convenience: Analpram to use twice daily and Prednisone to take as directed, and Xanax to take at bedtime as needed.  cc: Adella Hare, MD

## 2013-04-21 NOTE — Progress Notes (Signed)
Latasha Wood 1956/02/20 MRN [id number]        History of Present Illness:  This is a 58 year old African American female with ulcerative colitis since 38. Last office visit and December 2013. She has been on maximum medical therapy but has had another flareup starting 3 weeks ago and was put back on prednisone 25 mg daily. She admits not taking her medications as she is supposed to. She has not been taking her mesalamine at all. She takes 6-MP 75 mg daily and she takes Humira at the irregular intervals sometimes being delayed by 3-4 days. Last flexible sigmoidoscopy in June 2012 showed active colitis from 0-40 cm. Biopsies showed chronic active inflammation with altered crypt architecture and active neutrophilic cryptitis. Her last bone density was December 2012. Last TB  testing was January 2014, she does not smoke   Past Medical History  Diagnosis Date  . Hypertension   . Colitis, ulcerative   . Hyperlipidemia   . Colon polyps   . Blood transfusion   . Anemia    Past Surgical History  Procedure Laterality Date  . Gravida 6 para 2      all SAB  . Hyadiform mole    . Dilation and curettage of uterus      most likely after miscarriage  . Knee arthroscopy w/ meniscal repair      right knee (Daldorf)  . Fibrocystic breast disease      q 6 month mammogram  . Abdominal hysterectomy  2000    reports that she has never smoked. She has never used smokeless tobacco. She reports that she drinks about 1.0 ounces of alcohol per week. She reports that she does not use illicit drugs. family history includes Breast cancer in her mother; Breast cancer (age of onset: 33) in her sister; Colon cancer in her paternal aunt; Diabetes in her brother, father, mother, and sister; and Heart disease in her father. No Known Allergies      Review of Systems: Denies fever. Positive for crampy abdominal pain and rectal bleeding  The remainder of the 10 point ROS is negative except as outlined in  H&P   Physical Exam: General appearance  Well developed, in no distress. Appears cushingoid Eyes- non icteric. HEENT nontraumatic, normocephalic. Mouth no lesions, tongue papillated, no cheilosis. Neck supple without adenopathy, thyroid not enlarged, no carotid bruits, no JVD. Lungs Clear to auscultation bilaterally. Cor normal S1, normal S2, regular rhythm, no murmur,  quiet precordium. Abdomen: Mildly tender diffusely more so in the left lower quadrant. No rebound no distention Rectal: Hemoccult-positive stool Extremities no pedal edema. Skin no lesions. Neurological alert and oriented x 3. Psychological normal mood and affect.  Assessment and Plan:  57 year old [place] American female with  at least 30 years of ulcerative colitis predominantly left-sided. She is not complying with her medical regimen and has had recent flareup. She will need to continue on prednisone 25 milligrams a day and get on a regular schedule with her mesalamine and 6-MP and Humira. I had a long talk with her concerning importance of taking her medications. She is at  high-risk for possible colon cancer and will need frequent screening. I would like to get to where she is under better control before she is due for colonoscopy. She needs to take her Xanax. We will check her CBC, metabolic panel, sedimentation rate. We will refill her prednisone 10 mg, Analpram cream 2.5% and Xanax 0.68m.I will see her in 2-3 months   04/21/2013 DSydell Axon  Olevia Perches

## 2013-04-22 LAB — HEPATITIS A ANTIBODY, TOTAL: Hep A Total Ab: POSITIVE — AB

## 2013-04-23 LAB — HEPATITIS B SURFACE ANTIBODY,QUALITATIVE: Hep B S Ab: NONREACTIVE

## 2013-04-24 LAB — SEDIMENTATION RATE: Sed Rate: 48 mm/hr — ABNORMAL HIGH (ref 0–22)

## 2013-05-01 ENCOUNTER — Ambulatory Visit (INDEPENDENT_AMBULATORY_CARE_PROVIDER_SITE_OTHER): Payer: 59 | Admitting: Internal Medicine

## 2013-05-01 ENCOUNTER — Other Ambulatory Visit: Payer: Self-pay | Admitting: *Deleted

## 2013-05-01 DIAGNOSIS — K5190 Ulcerative colitis, unspecified, without complications: Secondary | ICD-10-CM

## 2013-05-01 DIAGNOSIS — Z23 Encounter for immunization: Secondary | ICD-10-CM

## 2013-06-01 ENCOUNTER — Ambulatory Visit (INDEPENDENT_AMBULATORY_CARE_PROVIDER_SITE_OTHER): Payer: 59 | Admitting: Internal Medicine

## 2013-06-01 DIAGNOSIS — Z23 Encounter for immunization: Secondary | ICD-10-CM

## 2013-06-01 DIAGNOSIS — K5190 Ulcerative colitis, unspecified, without complications: Secondary | ICD-10-CM

## 2013-06-26 ENCOUNTER — Telehealth: Payer: Self-pay | Admitting: *Deleted

## 2013-06-26 MED ORDER — HYDROCORTISONE 100 MG/60ML RE ENEM: 100.0000 mg | ENEMA | Freq: Every day | RECTAL | Status: DC

## 2013-06-26 MED ORDER — PREDNISONE 10 MG PO TABS: ORAL_TABLET | ORAL | Status: DC

## 2013-06-26 NOTE — Telephone Encounter (Signed)
Rx's sent to pharmacy. Scheduled patient for OV on 07/21/13 at 3:00 PM.

## 2013-06-26 NOTE — Telephone Encounter (Signed)
Spoke with patient and she is asking for Prednisone refill. She states she is on Prednisone 30 mg now. [place] has been [place] week. She states she has tried to taper down but her symptoms return when she tapers. Please, advise.

## 2013-06-26 NOTE — Telephone Encounter (Signed)
Please refill Prednisone. Also, start Cort enema qhs. OV in next 2-3 weeks

## 2013-07-21 ENCOUNTER — Ambulatory Visit (INDEPENDENT_AMBULATORY_CARE_PROVIDER_SITE_OTHER): Payer: 59 | Admitting: Internal Medicine

## 2013-07-21 ENCOUNTER — Encounter: Payer: Self-pay | Admitting: Internal Medicine

## 2013-07-21 VITALS — BP 140/88 | HR 88 | Ht 64.0 in | Wt 185.1 lb

## 2013-07-21 DIAGNOSIS — D849 Immunodeficiency, unspecified: Secondary | ICD-10-CM

## 2013-07-21 DIAGNOSIS — Z79899 Other long term (current) drug therapy: Secondary | ICD-10-CM

## 2013-07-21 DIAGNOSIS — D509 Iron deficiency anemia, unspecified: Secondary | ICD-10-CM

## 2013-07-21 DIAGNOSIS — K5150 Left sided colitis without complications: Secondary | ICD-10-CM

## 2013-07-21 NOTE — Patient Instructions (Addendum)
Please follow up with Dr Olevia Perches in 3 months.  CC: Dr Linda Hedges

## 2013-07-21 NOTE — Progress Notes (Signed)
MEILIN BROSH 1956-03-29 MRN [id number]        History of Present Illness:  This is a 57 year old African American female with left-sided ulcerative colitis since 54. Flareup in December 2013 and again in May and June of this year. Last office visit in June 2014. She has been on Humira 40 mg every 2 weeks for several months but has taken it erratically, she is currently about 3 weeks behind. She is also on 6-MP 75 mg a day and mesalamine 4.8 g daily. Her last hemoglobin  in June was 11.9, last flexible sigmoidoscopy in June 2012 showed active colitis from 0-40 cm. Biopsies showed  altered crypt architecture and active neutrophyllic cryptitis consistent with chronic active colitis. She is supposed to go and cord enemas but takes it only sometimes. She has been able to come down on her prednisone from 30-20 mg a day. She is up-to-date on the TB test and on her bone density. She is unhappy with herself not being able to follow her medical regimen like she is supposed to. She has not missed any work because of the colitis.   Past Medical History  Diagnosis Date  . Hypertension   . Colitis, ulcerative   . Hyperlipidemia   . Colon polyps   . Blood transfusion   . Anemia    Past Surgical History  Procedure Laterality Date  . Gravida 6 para 2      all SAB  . Hyadiform mole    . Dilation and curettage of uterus      most likely after miscarriage  . Knee arthroscopy w/ meniscal repair      right knee (Daldorf)  . Fibrocystic breast disease      q 6 month mammogram  . Abdominal hysterectomy  2000    reports that she has never smoked. She has never used smokeless tobacco. She reports that she drinks about 1.0 ounces of alcohol per week. She reports that she does not use illicit drugs. family history includes Breast cancer in her mother; Breast cancer (age of onset: 3) in her sister; Colon cancer in her paternal aunt; Diabetes in her brother, father, mother, and sister; Heart disease  in her father. No Known Allergies      Review of Systems: Occasional fecal urgency. Denies rectal bleeding. Occasional left lower quadrant abdominal pain. Positive for weight gain  The remainder of the 10 point ROS is negative except as outlined in H&P   Physical Exam: General appearance  Well developed, in no distress. Cushingoid appearance Eyes- non icteric. HEENT nontraumatic, normocephalic. Mouth no lesions, tongue papillated, no cheilosis. Neck supple without adenopathy, thyroid not enlarged, no carotid bruits, no JVD. Lungs Clear to auscultation bilaterally. Cor normal S1, normal S2, regular rhythm, no murmur,  quiet precordium. Abdomen: Soft minimally tender in left lower quadrant. Normoactive bowel sounds. No distention no tympany. Rectal: Not done Extremities no pedal edema. Skin no lesions. Neurological alert and oriented x 3. Psychological normal mood and affect.  Assessment and Plan:   57 year old [place] American female with the long-standing left-sided ulcerative colitis, increased risk for colorectal cancer. who is still not in complete remission on  Prednisone taper although she has been improved on Humira, Mesalamine and 6-MP . We had a long talk about medical compliance. She will ketchup with her Humira injections. She will resume cort enemas and continue to take oral medications. Her prednisone will be reduced to 15 mg daily for 2 weeks and then by 5 mg every  2 weeks. I will see her  in 3 months, she will be due for colonoscopy soon.  07/21/2013 [name]

## 2013-07-23 ENCOUNTER — Encounter: Payer: Self-pay | Admitting: Internal Medicine

## 2013-09-14 ENCOUNTER — Other Ambulatory Visit: Payer: Self-pay

## 2013-10-09 ENCOUNTER — Telehealth: Payer: Self-pay | Admitting: Internal Medicine

## 2013-10-10 MED ORDER — MESALAMINE 1.2 G PO TBEC: DELAYED_RELEASE_TABLET | ORAL | Status: DC

## 2013-10-10 MED ORDER — PREDNISONE 5 MG PO TABS: 5.0000 mg | ORAL_TABLET | Freq: Every day | ORAL | Status: DC

## 2013-10-10 NOTE — Telephone Encounter (Signed)
I have advised patient that she is due for return office visit in December. I scheduled an appt on 10/31/13 since no appt was scheduled previously. I advised that we will give her final Hepatitis B injection on the day of her office visit (she was originally scheduled for her final hep injection on 11/07/13). I confirmed with patient that she is taking Lialda 4 tablets daily (as she has not gotten a new script from our office since 01/2013). She states that she is. I advised that we will send prednisone 5 mg tablets instead of 10 mg tablets since she should have already tapered down substantially. She states that she has actually increased her prednisone on her own to 25 mg daily because she "flared." I asked that she let us know when she flares so we can advise her on what to do rather than having her self medicate. She verbalizes understanding. She will start decreasing by 5 mg every 2 weeks as previously directed.

## 2013-10-31 ENCOUNTER — Encounter: Payer: Self-pay | Admitting: Internal Medicine

## 2013-10-31 ENCOUNTER — Ambulatory Visit (INDEPENDENT_AMBULATORY_CARE_PROVIDER_SITE_OTHER): Payer: 59 | Admitting: Internal Medicine

## 2013-10-31 VITALS — BP 112/80 | HR 73 | Ht 64.5 in | Wt 194.0 lb

## 2013-10-31 DIAGNOSIS — Z23 Encounter for immunization: Secondary | ICD-10-CM

## 2013-10-31 DIAGNOSIS — D849 Immunodeficiency, unspecified: Secondary | ICD-10-CM

## 2013-10-31 DIAGNOSIS — K5190 Ulcerative colitis, unspecified, without complications: Secondary | ICD-10-CM

## 2013-10-31 DIAGNOSIS — Z79899 Other long term (current) drug therapy: Secondary | ICD-10-CM

## 2013-10-31 MED ORDER — ALPRAZOLAM 0.25 MG PO TABS: 0.2500 mg | ORAL_TABLET | Freq: Every evening | ORAL | Status: DC | PRN

## 2013-10-31 MED ORDER — PREDNISONE 5 MG PO TABS: 5.0000 mg | ORAL_TABLET | Freq: Every day | ORAL | Status: DC

## 2013-10-31 MED ORDER — MESALAMINE 1.2 G PO TBEC: DELAYED_RELEASE_TABLET | ORAL | Status: DC

## 2013-10-31 NOTE — Patient Instructions (Addendum)
We have sent medications to your pharmacy for you to pick up at your convenience. You have been given your 3 rd Hep B vaccine today. CC:  Latasha Hare MD

## 2013-10-31 NOTE — Progress Notes (Signed)
Latasha Wood 1956-08-11 [id number]   History of Present Illness:   This is a 57 year old, African American female with left-sided ulcerative colitis since 60. She had a recent flare up starting in December 2013.She was refractory to steroids. She was started on Humira 40 mg every 2 weeks two months ago and has done well on it. Her last flexible sigmoidoscopy in June 2012 showed active colitis from 0-40 cm. Biopsies showed active neutrophilic cryptitis consistent with chronic active colitis. She had a full colonoscopy in December 2010 which again showed left-sided colitis.and normal right colon. She has been able to taper her prednisone down to 5 mg after starting Humira   but increased it back  to 10 mg a day one week ago. He takes mesalamine 4.8 g daily and 6-MP 50 mg daily. She is due for a third injection of hepatitis B vaccine today.    Past Medical History  Diagnosis Date  . Hypertension   . Colitis, ulcerative   . Hyperlipidemia   . Colon polyps   . Blood transfusion   . Anemia     Past Surgical History  Procedure Laterality Date  . Gravida 6 para 2      all SAB  . Hyadiform mole    . Dilation and curettage of uterus      most likely after miscarriage  . Knee arthroscopy w/ meniscal repair      right knee (Daldorf)  . Fibrocystic breast disease      q 6 month mammogram  . Abdominal hysterectomy  2000    No Known Allergies  Family history and social history have been reviewed.  Review of Systems: Denies abdominal pain. Occasional diarrhea. Occasional rectal bleeding  The remainder of the 10 point ROS is negative except as outlined in the H&P  Physical Exam: General Appearance Well developed, in no distress, overweight Eyes  Non icteric  HEENT  Non traumatic, normocephalic  Mouth No lesion, tongue papillated, no cheilosis Neck Supple without adenopathy, thyroid not enlarged, no carotid bruits, no JVD Lungs Clear to auscultation bilaterally COR Normal S1,  normal S2, regular rhythm, no murmur, quiet precordium  Psychological Normal mood and affect  Assessment and Plan:   Problem #1 Left-sided ulcerative colitis which has been mildly active but is definitely responding biologicals.. She will stop  6 MP and continue on mesalamine 4.8 g daily. We will refill her Xanax 0.25 mg and prednisone which will be tapered again to 5 mg daily. I will see her in 6 months.    [name] 10/31/2013

## 2013-11-13 ENCOUNTER — Other Ambulatory Visit: Payer: Self-pay | Admitting: Internal Medicine

## 2013-12-06 ENCOUNTER — Ambulatory Visit (INDEPENDENT_AMBULATORY_CARE_PROVIDER_SITE_OTHER): Payer: 59 | Admitting: Internal Medicine

## 2013-12-06 DIAGNOSIS — K5190 Ulcerative colitis, unspecified, without complications: Secondary | ICD-10-CM

## 2013-12-08 LAB — CHG CHEST X-RAY 1 VW: TB SKIN TEST: NEGATIVE

## 2013-12-08 NOTE — Progress Notes (Signed)
Patient here to have ppd skin test read.  Would not result. Results were normal, no induration

## 2013-12-12 ENCOUNTER — Telehealth: Payer: Self-pay | Admitting: Internal Medicine

## 2013-12-12 MED ORDER — MESALAMINE 1.2 G PO TBEC: DELAYED_RELEASE_TABLET | ORAL | Status: DC

## 2013-12-12 MED ORDER — PREDNISONE 5 MG PO TABS: 5.0000 mg | ORAL_TABLET | Freq: Every day | ORAL | Status: DC

## 2013-12-12 NOTE — Telephone Encounter (Signed)
OK to refill Prednisone and Lialda.. She knows how to taper Prednisone, by 5 mg every 2 weeks.

## 2013-12-12 NOTE — Telephone Encounter (Signed)
I have spoken to patient who needs Lialda sent to her mail in pharmacy. She actually has several refills available at [facility]. I have contacted Santiago Glad @ [facility] and D/C'ed Lialda rx there. I have resent Lialda 90 day supply to [facility] per patient request.  Patient also states that she needs refills on prednisone. She states that she was to start tapering to 5 mg daily of prednisone, however, her mother recently passed away and she started flaring. She started herself on 30 mg daily of prednisone and has now decreased back down to 15 mg daily. Dr Olevia Perches, may I refill her prednisone early?

## 2013-12-12 NOTE — Telephone Encounter (Signed)
Rx sent 

## 2014-02-07 ENCOUNTER — Telehealth: Payer: Self-pay | Admitting: Internal Medicine

## 2014-02-07 MED ORDER — ADALIMUMAB 40 MG/0.8ML ~~LOC~~ KIT: 40.0000 mg | PACK | SUBCUTANEOUS | Status: DC

## 2014-02-07 NOTE — Telephone Encounter (Signed)
Rx sent. Patient advised.

## 2014-02-12 ENCOUNTER — Encounter: Payer: Self-pay | Admitting: Internal Medicine

## 2014-02-16 ENCOUNTER — Ambulatory Visit (INDEPENDENT_AMBULATORY_CARE_PROVIDER_SITE_OTHER): Payer: 59 | Admitting: Internal Medicine

## 2014-02-16 ENCOUNTER — Encounter: Payer: Self-pay | Admitting: Internal Medicine

## 2014-02-16 ENCOUNTER — Other Ambulatory Visit (INDEPENDENT_AMBULATORY_CARE_PROVIDER_SITE_OTHER): Payer: 59

## 2014-02-16 VITALS — BP 110/76 | HR 73 | Temp 98.6°F | Resp 16 | Ht 64.0 in | Wt 187.0 lb

## 2014-02-16 DIAGNOSIS — Z1231 Encounter for screening mammogram for malignant neoplasm of breast: Secondary | ICD-10-CM | POA: Insufficient documentation

## 2014-02-16 DIAGNOSIS — Z0000 Encounter for general adult medical examination without abnormal findings: Secondary | ICD-10-CM | POA: Diagnosis not present

## 2014-02-16 DIAGNOSIS — I10 Essential (primary) hypertension: Secondary | ICD-10-CM

## 2014-02-16 DIAGNOSIS — E785 Hyperlipidemia, unspecified: Secondary | ICD-10-CM

## 2014-02-16 LAB — CBC WITH DIFFERENTIAL/PLATELET
BASOS ABS: 0 10*3/uL (ref 0.0–0.1)
BASOS PCT: 0.4 % (ref 0.0–3.0)
EOS ABS: 0.1 10*3/uL (ref 0.0–0.7)
Eosinophils Relative: 1.2 % (ref 0.0–5.0)
HCT: 37 % (ref 36.0–46.0)
HEMOGLOBIN: 12.3 g/dL (ref 12.0–15.0)
LYMPHS PCT: 39.5 % (ref 12.0–46.0)
Lymphs Abs: 2.4 10*3/uL (ref 0.7–4.0)
MCHC: 33.3 g/dL (ref 30.0–36.0)
MCV: 91.5 fl (ref 78.0–100.0)
MONOS PCT: 8.4 % (ref 3.0–12.0)
Monocytes Absolute: 0.5 10*3/uL (ref 0.1–1.0)
Neutro Abs: 3.1 10*3/uL (ref 1.4–7.7)
Neutrophils Relative %: 50.5 % (ref 43.0–77.0)
Platelets: 312 10*3/uL (ref 150.0–400.0)
RBC: 4.05 Mil/uL (ref 3.87–5.11)
RDW: 14.2 % (ref 11.5–14.6)
WBC: 6.2 10*3/uL (ref 4.5–10.5)

## 2014-02-16 LAB — URINALYSIS, ROUTINE W REFLEX MICROSCOPIC
Bilirubin Urine: NEGATIVE
Hgb urine dipstick: NEGATIVE
Leukocytes, UA: NEGATIVE
NITRITE: NEGATIVE
Specific Gravity, Urine: 1.02 (ref 1.000–1.030)
TOTAL PROTEIN, URINE-UPE24: NEGATIVE
UROBILINOGEN UA: 0.2 (ref 0.0–1.0)
Urine Glucose: NEGATIVE
pH: 6 (ref 5.0–8.0)

## 2014-02-16 LAB — LIPID PANEL
Cholesterol: 218 mg/dL — ABNORMAL HIGH (ref 0–200)
HDL: 65.8 mg/dL (ref >39.00)
LDL Cholesterol: 136 mg/dL — ABNORMAL HIGH (ref 0–99)
Total CHOL/HDL Ratio: 3
Triglycerides: 82 mg/dL (ref 0.0–149.0)
VLDL: 16.4 mg/dL (ref 0.0–40.0)

## 2014-02-16 LAB — TSH: TSH: 0.48 u[IU]/mL (ref 0.35–5.50)

## 2014-02-16 LAB — COMPREHENSIVE METABOLIC PANEL
ALK PHOS: 92 U/L (ref 39–117)
ALT: 17 U/L (ref 0–35)
AST: 22 U/L (ref 0–37)
Albumin: 3.8 g/dL (ref 3.5–5.2)
BILIRUBIN TOTAL: 0.6 mg/dL (ref 0.3–1.2)
BUN: 12 mg/dL (ref 6–23)
CO2: 28 mEq/L (ref 19–32)
Calcium: 9.5 mg/dL (ref 8.4–10.5)
Chloride: 103 mEq/L (ref 96–112)
Creatinine, Ser: 0.8 mg/dL (ref 0.4–1.2)
GFR: 89.27 mL/min (ref >60.00)
GLUCOSE: 73 mg/dL (ref 70–99)
Potassium: 3.7 mEq/L (ref 3.5–5.1)
SODIUM: 137 meq/L (ref 135–145)
Total Protein: 7.1 g/dL (ref 6.0–8.3)

## 2014-02-16 NOTE — Progress Notes (Signed)
   Subjective:    Patient ID: Latasha Wood, female    DOB: 1956/01/03, 58 y.o.   MRN: [id number]  Hypertension This is a chronic problem. The current episode started more than 1 year ago. The problem has been rapidly improving since onset. The problem is controlled. Associated symptoms include anxiety. Pertinent negatives include no blurred vision, chest pain, headaches, malaise/fatigue, neck pain, orthopnea, palpitations, peripheral edema, PND, shortness of breath or sweats. Past treatments include angiotensin blockers and diuretics. The current treatment provides significant improvement. There are no compliance problems.       Review of Systems  Constitutional: Negative.  Negative for fever, chills, malaise/fatigue, diaphoresis, appetite change and fatigue.  HENT: Negative.   Eyes: Negative.  Negative for blurred vision.  Respiratory: Negative.  Negative for apnea, cough, choking, chest tightness, shortness of breath, wheezing and stridor.   Cardiovascular: Negative.  Negative for chest pain, palpitations, orthopnea, leg swelling and PND.  Gastrointestinal: Negative.  Negative for nausea, vomiting, abdominal pain, diarrhea, constipation and blood in stool.  Endocrine: Negative.   Genitourinary: Negative.   Musculoskeletal: Negative.  Negative for arthralgias, myalgias and neck pain.  Skin: Negative.   Allergic/Immunologic: Negative.   Neurological: Negative.  Negative for headaches.  Hematological: Negative.  Negative for adenopathy. Does not bruise/bleed easily.  Psychiatric/Behavioral: Negative.        Objective:   Physical Exam  Vitals reviewed. Constitutional: She is oriented to person, place, and time. She appears well-developed and well-nourished. No distress.  HENT:  Head: Normocephalic and atraumatic.  Mouth/Throat: Oropharynx is clear and moist. No oropharyngeal exudate.  Eyes: Conjunctivae are normal. Right eye exhibits no discharge. Left eye exhibits no discharge.  No scleral icterus.  Neck: Normal range of motion. Neck supple. No JVD present. No tracheal deviation present. No thyromegaly present.  Cardiovascular: Normal rate, regular rhythm, normal heart sounds and intact distal pulses.  Exam reveals no gallop and no friction rub.   No murmur heard. Pulmonary/Chest: Effort normal and breath sounds normal. No stridor. No respiratory distress. She has no wheezes. She has no rales. She exhibits no tenderness.  Abdominal: Soft. Bowel sounds are normal. She exhibits no distension and no mass. There is no tenderness. There is no rebound and no guarding.  Musculoskeletal: Normal range of motion. She exhibits no edema and no tenderness.  Lymphadenopathy:    She has no cervical adenopathy.  Neurological: She is oriented to person, place, and time.  Skin: Skin is warm and dry. No rash noted. She is not diaphoretic. No erythema. No pallor.  Psychiatric: She has a normal mood and affect. Her behavior is normal. Judgment and thought content normal.     Lab Results  Component Value Date   WBC 12.5* 04/21/2013   HGB 11.9* 04/21/2013   HCT 35.9* 04/21/2013   PLT 304.0 04/21/2013   GLUCOSE 78 04/21/2013   CHOL 260* 09/05/2012   TRIG 82.0 09/05/2012   HDL 79.70 09/05/2012   LDLDIRECT 157.8 09/05/2012   ALT 20 04/21/2013   AST 16 04/21/2013   NA 142 04/21/2013   K 3.8 04/21/2013   CL 105 04/21/2013   CREATININE 0.9 04/21/2013   BUN 13 04/21/2013   CO2 26 04/21/2013   TSH 1.31 05/10/2012       Assessment & Plan:

## 2014-02-16 NOTE — Patient Instructions (Signed)
Preventive Care for Adults, Female A healthy lifestyle and preventive care can promote health and wellness. Preventive health guidelines for women include the following key practices.  A routine yearly physical is a good way to check with your health care provider about your health and preventive screening. It is a chance to share any concerns and updates on your health and to receive a thorough exam.  Visit your dentist for a routine exam and preventive care every 6 months. Brush your teeth twice a day and floss once a day. Good oral hygiene prevents tooth decay and gum disease.  The frequency of eye exams is based on your age, health, family medical history, use of contact lenses, and other factors. Follow your health care provider's recommendations for frequency of eye exams.  Eat a healthy diet. Foods like vegetables, fruits, whole grains, low-fat dairy products, and lean protein foods contain the nutrients you need without too many calories. Decrease your intake of foods high in solid fats, added sugars, and salt. Eat the right amount of calories for you.Get information about a proper diet from your health care provider, if necessary.  Regular physical exercise is one of the most important things you can do for your health. Most adults should get at least 150 minutes of moderate-intensity exercise (any activity that increases your heart rate and causes you to sweat) each week. In addition, most adults need muscle-strengthening exercises on 2 or more days a week.  Maintain a healthy weight. The body mass index (BMI) is a screening tool to identify possible weight problems. It provides an estimate of body fat based on height and weight. Your health care provider can find your BMI, and can help you achieve or maintain a healthy weight.For adults 20 years and older:  A BMI below 18.5 is considered underweight.  A BMI of 18.5 to 24.9 is normal.  A BMI of 25 to 29.9 is considered overweight.  A  BMI of 30 and above is considered obese.  Maintain normal blood lipids and cholesterol levels by exercising and minimizing your intake of saturated fat. Eat a balanced diet with plenty of fruit and vegetables. Blood tests for lipids and cholesterol should begin at age 62 and be repeated every 5 years. If your lipid or cholesterol levels are high, you are over 50, or you are at high risk for heart disease, you may need your cholesterol levels checked more frequently.Ongoing high lipid and cholesterol levels should be treated with medicines if diet and exercise are not working.  If you smoke, find out from your health care provider how to quit. If you do not use tobacco, do not start.  Lung cancer screening is recommended for adults aged 36 80 years who are at high risk for developing lung cancer because of a history of smoking. A yearly low-dose CT scan of the lungs is recommended for people who have at least a 30-pack-year history of smoking and are a current smoker or have quit within the past 15 years. A pack year of smoking is smoking an average of 1 pack of cigarettes a day for 1 year (for example: 1 pack a day for 30 years or 2 packs a day for 15 years). Yearly screening should continue until the smoker has stopped smoking for at least 15 years. Yearly screening should be stopped for people who develop a health problem that would prevent them from having lung cancer treatment.  If you are pregnant, do not drink alcohol. If you  are breastfeeding, be very cautious about drinking alcohol. If you are not pregnant and choose to drink alcohol, do not have more than 1 drink per day. One drink is considered to be 12 ounces (355 mL) of beer, 5 ounces (148 mL) of wine, or 1.5 ounces (44 mL) of liquor.  Avoid use of street drugs. Do not share needles with anyone. Ask for help if you need support or instructions about stopping the use of drugs.  High blood pressure causes heart disease and increases the risk  of stroke. Your blood pressure should be checked at least every 1 to 2 years. Ongoing high blood pressure should be treated with medicines if weight loss and exercise do not work.  If you are 39 59 years old, ask your health care provider if you should take aspirin to prevent strokes.  Diabetes screening involves taking a blood sample to check your fasting blood sugar level. This should be done once every 3 years, after age 56, if you are within normal weight and without risk factors for diabetes. Testing should be considered at a younger age or be carried out more frequently if you are overweight and have at least 1 risk factor for diabetes.  Breast cancer screening is essential preventive care for women. You should practice "breast self-awareness." This means understanding the normal appearance and feel of your breasts and may include breast self-examination. Any changes detected, no matter how small, should be reported to a health care provider. Women in their 40s and 30s should have a clinical breast exam (CBE) by a health care provider as part of a regular health exam every 1 to 3 years. After age 28, women should have a CBE every year. Starting at age 72, women should consider having a mammogram (breast X-ray test) every year. Women who have a family history of breast cancer should talk to their health care provider about genetic screening. Women at a high risk of breast cancer should talk to their health care providers about having an MRI and a mammogram every year.  Breast cancer gene (BRCA)-related cancer risk assessment is recommended for women who have family members with BRCA-related cancers. BRCA-related cancers include breast, ovarian, tubal, and peritoneal cancers. Having family members with these cancers may be associated with an increased risk for harmful changes (mutations) in the breast cancer genes BRCA1 and BRCA2. Results of the assessment will determine the need for genetic counseling  and BRCA1 and BRCA2 testing.  The Pap test is a screening test for cervical cancer. A Pap test can show cell changes on the cervix that might become cervical cancer if left untreated. A Pap test is a procedure in which cells are obtained and examined from the lower end of the uterus (cervix).  Women should have a Pap test starting at age 59.  Between ages 42 and 13, Pap tests should be repeated every 2 years.  Beginning at age 53, you should have a Pap test every 3 years as long as the past 3 Pap tests have been normal.  Some women have medical problems that increase the chance of getting cervical cancer. Talk to your health care provider about these problems. It is especially important to talk to your health care provider if a new problem develops soon after your last Pap test. In these cases, your health care provider may recommend more frequent screening and Pap tests.  The above recommendations are the same for women who have or have not gotten the vaccine  for human papillomavirus (HPV).  If you had a hysterectomy for a problem that was not cancer or a condition that could lead to cancer, then you no longer need Pap tests. Even if you no longer need a Pap test, a regular exam is a good idea to make sure no other problems are starting.  If you are between ages 58 and 10 years, and you have had normal Pap tests going back 10 years, you no longer need Pap tests. Even if you no longer need a Pap test, a regular exam is a good idea to make sure no other problems are starting.  If you have had past treatment for cervical cancer or a condition that could lead to cancer, you need Pap tests and screening for cancer for at least 20 years after your treatment.  If Pap tests have been discontinued, risk factors (such as a new sexual partner) need to be reassessed to determine if screening should be resumed.  The HPV test is an additional test that may be used for cervical cancer screening. The HPV test  looks for the virus that can cause the cell changes on the cervix. The cells collected during the Pap test can be tested for HPV. The HPV test could be used to screen women aged 67 years and older, and should be used in women of any age who have unclear Pap test results. After the age of 65, women should have HPV testing at the same frequency as a Pap test.  Colorectal cancer can be detected and often prevented. Most routine colorectal cancer screening begins at the age of 25 years and continues through age 66 years. However, your health care provider may recommend screening at an earlier age if you have risk factors for colon cancer. On a yearly basis, your health care provider may provide home test kits to check for hidden blood in the stool. Use of a small camera at the end of a tube, to directly examine the colon (sigmoidoscopy or colonoscopy), can detect the earliest forms of colorectal cancer. Talk to your health care provider about this at age 79, when routine screening begins. Direct exam of the colon should be repeated every 5 10 years through age 47 years, unless early forms of pre-cancerous polyps or small growths are found.  People who are at an increased risk for hepatitis B should be screened for this virus. You are considered at high risk for hepatitis B if:  You were born in a country where hepatitis B occurs often. Talk with your health care provider about which countries are considered high risk.  Your parents were born in a high-risk country and you have not received a shot to protect against hepatitis B (hepatitis B vaccine).  You have HIV or AIDS.  You use needles to inject street drugs.  You live with, or have sex with, someone who has Hepatitis B.  You get hemodialysis treatment.  You take certain medicines for conditions like cancer, organ transplantation, and autoimmune conditions.  Hepatitis C blood testing is recommended for all people born from [birth year] through 1965 and  any individual with known risks for hepatitis C.  Practice safe sex. Use condoms and avoid high-risk sexual practices to reduce the spread of sexually transmitted infections (STIs). STIs include gonorrhea, chlamydia, syphilis, trichomonas, herpes, HPV, and human immunodeficiency virus (HIV). Herpes, HIV, and HPV are viral illnesses that have no cure. They can result in disability, cancer, and death. Sexually active women aged 66  years and younger should be checked for chlamydia. Older women with new or multiple partners should also be tested for chlamydia. Testing for other STIs is recommended if you are sexually active and at increased risk.  Osteoporosis is a disease in which the bones lose minerals and strength with aging. This can result in serious bone fractures or breaks. The risk of osteoporosis can be identified using a bone density scan. Women ages 18 years and over and women at risk for fractures or osteoporosis should discuss screening with their health care providers. Ask your health care provider whether you should take a calcium supplement or vitamin D to reduce the rate of osteoporosis.  Menopause can be associated with physical symptoms and risks. Hormone replacement therapy is available to decrease symptoms and risks. You should talk to your health care provider about whether hormone replacement therapy is right for you.  Use sunscreen. Apply sunscreen liberally and repeatedly throughout the day. You should seek shade when your shadow is shorter than you. Protect yourself by wearing long sleeves, pants, a wide-brimmed hat, and sunglasses year round, whenever you are outdoors.  Once a month, do a whole body skin exam, using a mirror to look at the skin on your back. Tell your health care provider of new moles, moles that have irregular borders, moles that are larger than a pencil eraser, or moles that have changed in shape or color.  Stay current with required vaccines  (immunizations).  Influenza vaccine. All adults should be immunized every year.  Tetanus, diphtheria, and acellular pertussis (Td, Tdap) vaccine. Pregnant women should receive 1 dose of Tdap vaccine during each pregnancy. The dose should be obtained regardless of the length of time since the last dose. Immunization is preferred during the 27th 36th week of gestation. An adult who has not previously received Tdap or who does not know her vaccine status should receive 1 dose of Tdap. This initial dose should be followed by tetanus and diphtheria toxoids (Td) booster doses every 10 years. Adults with an unknown or incomplete history of completing a 3-dose immunization series with Td-containing vaccines should begin or complete a primary immunization series including a Tdap dose. Adults should receive a Td booster every 10 years.  Varicella vaccine. An adult without evidence of immunity to varicella should receive 2 doses or a second dose if she has previously received 1 dose. Pregnant females who do not have evidence of immunity should receive the first dose after pregnancy. This first dose should be obtained before leaving the health care facility. The second dose should be obtained 4 8 weeks after the first dose.  Human papillomavirus (HPV) vaccine. Females aged 9 26 years who have not received the vaccine previously should obtain the 3-dose series. The vaccine is not recommended for use in pregnant females. However, pregnancy testing is not needed before receiving a dose. If a female is found to be pregnant after receiving a dose, no treatment is needed. In that case, the remaining doses should be delayed until after the pregnancy. Immunization is recommended for any person with an immunocompromised condition through the age of 51 years if she did not get any or all doses earlier. During the 3-dose series, the second dose should be obtained 4 8 weeks after the first dose. The third dose should be obtained  24 weeks after the first dose and 16 weeks after the second dose.  Zoster vaccine. One dose is recommended for adults aged 57 years or older unless certain  conditions are present.  Measles, mumps, and rubella (MMR) vaccine. Adults born before [birth year] generally are considered immune to measles and mumps. Adults born in [birth year] or later should have 1 or more doses of MMR vaccine unless there is a contraindication to the vaccine or there is laboratory evidence of immunity to each of the three diseases. A routine second dose of MMR vaccine should be obtained at least 28 days after the first dose for students attending postsecondary schools, health care workers, or international travelers. People who received inactivated measles vaccine or an unknown type of measles vaccine during 1963 1967 should receive 2 doses of MMR vaccine. People who received inactivated mumps vaccine or an unknown type of mumps vaccine before 1979 and are at high risk for mumps infection should consider immunization with 2 doses of MMR vaccine. For females of childbearing age, rubella immunity should be determined. If there is no evidence of immunity, females who are not pregnant should be vaccinated. If there is no evidence of immunity, females who are pregnant should delay immunization until after pregnancy. Unvaccinated health care workers born before [birth year] who lack laboratory evidence of measles, mumps, or rubella immunity or laboratory confirmation of disease should consider measles and mumps immunization with 2 doses of MMR vaccine or rubella immunization with 1 dose of MMR vaccine.  Pneumococcal 13-valent conjugate (PCV13) vaccine. When indicated, a person who is uncertain of her immunization history and has no record of immunization should receive the PCV13 vaccine. An adult aged 42 years or older who has certain medical conditions and has not been previously immunized should receive 1 dose of PCV13 vaccine. This PCV13 should be followed  with a dose of pneumococcal polysaccharide (PPSV23) vaccine. The PPSV23 vaccine dose should be obtained at least 8 weeks after the dose of PCV13 vaccine. An adult aged 4 years or older who has certain medical conditions and previously received 1 or more doses of PPSV23 vaccine should receive 1 dose of PCV13. The PCV13 vaccine dose should be obtained 1 or more years after the last PPSV23 vaccine dose.  Pneumococcal polysaccharide (PPSV23) vaccine. When PCV13 is also indicated, PCV13 should be obtained first. All adults aged 27 years and older should be immunized. An adult younger than age 33 years who has certain medical conditions should be immunized. Any person who resides in a nursing home or long-term care facility should be immunized. An adult smoker should be immunized. People with an immunocompromised condition and certain other conditions should receive both PCV13 and PPSV23 vaccines. People with human immunodeficiency virus (HIV) infection should be immunized as soon as possible after diagnosis. Immunization during chemotherapy or radiation therapy should be avoided. Routine use of PPSV23 vaccine is not recommended for American Indians, [place] Natives, or people younger than 65 years unless there are medical conditions that require PPSV23 vaccine. When indicated, people who have unknown immunization and have no record of immunization should receive PPSV23 vaccine. One-time revaccination 5 years after the first dose of PPSV23 is recommended for people aged 13 64 years who have chronic kidney failure, nephrotic syndrome, asplenia, or immunocompromised conditions. People who received 1 2 doses of PPSV23 before age 66 years should receive another dose of PPSV23 vaccine at age 27 years or later if at least 5 years have passed since the previous dose. Doses of PPSV23 are not needed for people immunized with PPSV23 at or after age 33 years.  Meningococcal vaccine. Adults with asplenia or persistent complement  component deficiencies should receive 2  doses of quadrivalent meningococcal conjugate (MenACWY-D) vaccine. The doses should be obtained at least 2 months apart. Microbiologists working with certain meningococcal bacteria, [place] recruits, people at risk during an outbreak, and people who travel to or live in countries with a high rate of meningitis should be immunized. A first-year college student up through age 49 years who is living in a residence hall should receive a dose if she did not receive a dose on or after her 16th birthday. Adults who have certain high-risk conditions should receive one or more doses of vaccine.  Hepatitis A vaccine. Adults who wish to be protected from this disease, have certain high-risk conditions, work with hepatitis A-infected animals, work in hepatitis A research labs, or travel to or work in countries with a high rate of hepatitis A should be immunized. Adults who were previously unvaccinated and who anticipate close contact with an international adoptee during the first 60 days after arrival in the [place] from a country with a high rate of hepatitis A should be immunized.  Hepatitis B vaccine. Adults who wish to be protected from this disease, have certain high-risk conditions, may be exposed to blood or other infectious body fluids, are household contacts or sex partners of hepatitis B positive people, are clients or workers in certain care facilities, or travel to or work in countries with a high rate of hepatitis B should be immunized.  Haemophilus influenzae type b (Hib) vaccine. A previously unvaccinated person with asplenia or sickle cell disease or having a scheduled splenectomy should receive 1 dose of Hib vaccine. Regardless of previous immunization, a recipient of a hematopoietic stem cell transplant should receive a 3-dose series 6 12 months after her successful transplant. Hib vaccine is not recommended for adults with HIV infection. Preventive  Services / Frequency Ages 24 to 39years  Blood pressure check.** / Every 1 to 2 years.  Lipid and cholesterol check.** / Every 5 years beginning at age 66.  Clinical breast exam.** / Every 3 years for women in their 12s and 24s.  BRCA-related cancer risk assessment.** / For women who have family members with a BRCA-related cancer (breast, ovarian, tubal, or peritoneal cancers).  Pap test.** / Every 2 years from ages 31 through 69. Every 3 years starting at age 64 through age 76 or 89 with a history of 3 consecutive normal Pap tests.  HPV screening.** / Every 3 years from ages 10 through ages 10 to 96 with a history of 3 consecutive normal Pap tests.  Hepatitis C blood test.** / For any individual with known risks for hepatitis C.  Skin self-exam. / Monthly.  Influenza vaccine. / Every year.  Tetanus, diphtheria, and acellular pertussis (Tdap, Td) vaccine.** / Consult your health care provider. Pregnant women should receive 1 dose of Tdap vaccine during each pregnancy. 1 dose of Td every 10 years.  Varicella vaccine.** / Consult your health care provider. Pregnant females who do not have evidence of immunity should receive the first dose after pregnancy.  HPV vaccine. / 3 doses over 6 months, if 90 and younger. The vaccine is not recommended for use in pregnant females. However, pregnancy testing is not needed before receiving a dose.  Measles, mumps, rubella (MMR) vaccine.** / You need at least 1 dose of MMR if you were born in 1957 or later. You may also need a 2nd dose. For females of childbearing age, rubella immunity should be determined. If there is no evidence of immunity, females who are not  pregnant should be vaccinated. If there is no evidence of immunity, females who are pregnant should delay immunization until after pregnancy.  Pneumococcal 13-valent conjugate (PCV13) vaccine.** / Consult your health care provider.  Pneumococcal polysaccharide (PPSV23) vaccine.** / 1 to 2  doses if you smoke cigarettes or if you have certain conditions.  Meningococcal vaccine.** / 1 dose if you are age 88 to 6 years and a Market researcher living in a residence hall, or have one of several medical conditions, you need to get vaccinated against meningococcal disease. You may also need additional booster doses.  Hepatitis A vaccine.** / Consult your health care provider.  Hepatitis B vaccine.** / Consult your health care provider.  Haemophilus influenzae type b (Hib) vaccine.** / Consult your health care provider. Ages 23 to 64years  Blood pressure check.** / Every 1 to 2 years.  Lipid and cholesterol check.** / Every 5 years beginning at age 20 years.  Lung cancer screening. / Every year if you are aged 51 80 years and have a 30-pack-year history of smoking and currently smoke or have quit within the past 15 years. Yearly screening is stopped once you have quit smoking for at least 15 years or develop a health problem that would prevent you from having lung cancer treatment.  Clinical breast exam.** / Every year after age 8 years.  BRCA-related cancer risk assessment.** / For women who have family members with a BRCA-related cancer (breast, ovarian, tubal, or peritoneal cancers).  Mammogram.** / Every year beginning at age 10 years and continuing for as long as you are in good health. Consult with your health care provider.  Pap test.** / Every 3 years starting at age 30 years through age 5 or 61 years with a history of 3 consecutive normal Pap tests.  HPV screening.** / Every 3 years from ages 39 years through ages 72 to 19 years with a history of 3 consecutive normal Pap tests.  Fecal occult blood test (FOBT) of stool. / Every year beginning at age 59 years and continuing until age 27 years. You may not need to do this test if you get a colonoscopy every 10 years.  Flexible sigmoidoscopy or colonoscopy.** / Every 5 years for a flexible sigmoidoscopy or every  10 years for a colonoscopy beginning at age 110 years and continuing until age 63 years.  Hepatitis C blood test.** / For all people born from [birth year] through 1966 and any individual with known risks for hepatitis C.  Skin self-exam. / Monthly.  Influenza vaccine. / Every year.  Tetanus, diphtheria, and acellular pertussis (Tdap/Td) vaccine.** / Consult your health care provider. Pregnant women should receive 1 dose of Tdap vaccine during each pregnancy. 1 dose of Td every 10 years.  Varicella vaccine.** / Consult your health care provider. Pregnant females who do not have evidence of immunity should receive the first dose after pregnancy.  Zoster vaccine.** / 1 dose for adults aged 46 years or older.  Measles, mumps, rubella (MMR) vaccine.** / You need at least 1 dose of MMR if you were born in 1957 or later. You may also need a 2nd dose. For females of childbearing age, rubella immunity should be determined. If there is no evidence of immunity, females who are not pregnant should be vaccinated. If there is no evidence of immunity, females who are pregnant should delay immunization until after pregnancy.  Pneumococcal 13-valent conjugate (PCV13) vaccine.** / Consult your health care provider.  Pneumococcal polysaccharide (PPSV23) vaccine.** / 1  to 2 doses if you smoke cigarettes or if you have certain conditions.  Meningococcal vaccine.** / Consult your health care provider.  Hepatitis A vaccine.** / Consult your health care provider.  Hepatitis B vaccine.** / Consult your health care provider.  Haemophilus influenzae type b (Hib) vaccine.** / Consult your health care provider. Ages 57 years and over  Blood pressure check.** / Every 1 to 2 years.  Lipid and cholesterol check.** / Every 5 years beginning at age 53 years.  Lung cancer screening. / Every year if you are aged 55 80 years and have a 30-pack-year history of smoking and currently smoke or have quit within the past 15 years.  Yearly screening is stopped once you have quit smoking for at least 15 years or develop a health problem that would prevent you from having lung cancer treatment.  Clinical breast exam.** / Every year after age 35 years.  BRCA-related cancer risk assessment.** / For women who have family members with a BRCA-related cancer (breast, ovarian, tubal, or peritoneal cancers).  Mammogram.** / Every year beginning at age 26 years and continuing for as long as you are in good health. Consult with your health care provider.  Pap test.** / Every 3 years starting at age 62 years through age 80 or 74 years with 3 consecutive normal Pap tests. Testing can be stopped between 65 and 70 years with 3 consecutive normal Pap tests and no abnormal Pap or HPV tests in the past 10 years.  HPV screening.** / Every 3 years from ages 64 years through ages 79 or 110 years with a history of 3 consecutive normal Pap tests. Testing can be stopped between 65 and 70 years with 3 consecutive normal Pap tests and no abnormal Pap or HPV tests in the past 10 years.  Fecal occult blood test (FOBT) of stool. / Every year beginning at age 46 years and continuing until age 57 years. You may not need to do this test if you get a colonoscopy every 10 years.  Flexible sigmoidoscopy or colonoscopy.** / Every 5 years for a flexible sigmoidoscopy or every 10 years for a colonoscopy beginning at age 85 years and continuing until age 9 years.  Hepatitis C blood test.** / For all people born from [birth year] through 1966 and any individual with known risks for hepatitis C.  Osteoporosis screening.** / A one-time screening for women ages 75 years and over and women at risk for fractures or osteoporosis.  Skin self-exam. / Monthly.  Influenza vaccine. / Every year.  Tetanus, diphtheria, and acellular pertussis (Tdap/Td) vaccine.** / 1 dose of Td every 10 years.  Varicella vaccine.** / Consult your health care provider.  Zoster vaccine.** / 1  dose for adults aged 66 years or older.  Pneumococcal 13-valent conjugate (PCV13) vaccine.** / Consult your health care provider.  Pneumococcal polysaccharide (PPSV23) vaccine.** / 1 dose for all adults aged 51 years and older.  Meningococcal vaccine.** / Consult your health care provider.  Hepatitis A vaccine.** / Consult your health care provider.  Hepatitis B vaccine.** / Consult your health care provider.  Haemophilus influenzae type b (Hib) vaccine.** / Consult your health care provider. ** Family history and personal history of risk and conditions may change your health care provider's recommendations. Document Released: 12/22/2001 Document Revised: 08/16/2013 Document Reviewed: 03/23/2011 [facility] Patient Information 2014 [place], [place]. Hypertension As your heart beats, it forces blood through your arteries. This force is your blood pressure. If the pressure is too high, it is  called hypertension (HTN) or high blood pressure. HTN is dangerous because you may have it and not know it. High blood pressure may mean that your heart has to work harder to pump blood. Your arteries may be narrow or stiff. The extra work puts you at risk for heart disease, stroke, and other problems.  Blood pressure consists of two numbers, a higher number over a lower, 110/72, for example. It is stated as "110 over 72." The ideal is below 120 for the top number (systolic) and under 80 for the bottom (diastolic). Write down your blood pressure today. You should pay close attention to your blood pressure if you have certain conditions such as:  Heart failure.  Prior heart attack.  Diabetes  Chronic kidney disease.  Prior stroke.  Multiple risk factors for heart disease. To see if you have HTN, your blood pressure should be measured while you are seated with your arm held at the level of the heart. It should be measured at least twice. A one-time elevated blood pressure reading (especially in the  Emergency Department) does not mean that you need treatment. There may be conditions in which the blood pressure is different between your right and left arms. It is important to see your caregiver soon for a recheck. Most people have essential hypertension which means that there is not a specific cause. This type of high blood pressure may be lowered by changing lifestyle factors such as:  Stress.  Smoking.  Lack of exercise.  Excessive weight.  Drug/tobacco/alcohol use.  Eating less salt. Most people do not have symptoms from high blood pressure until it has caused damage to the body. Effective treatment can often prevent, delay or reduce that damage. TREATMENT  When a cause has been identified, treatment for high blood pressure is directed at the cause. There are a large number of medications to treat HTN. These fall into several categories, and your caregiver will help you select the medicines that are best for you. Medications may have side effects. You should review side effects with your caregiver. If your blood pressure stays high after you have made lifestyle changes or started on medicines,   Your medication(s) may need to be changed.  Other problems may need to be addressed.  Be certain you understand your prescriptions, and know how and when to take your medicine.  Be sure to follow up with your caregiver within the time frame advised (usually within two weeks) to have your blood pressure rechecked and to review your medications.  If you are taking more than one medicine to lower your blood pressure, make sure you know how and at what times they should be taken. Taking two medicines at the same time can result in blood pressure that is too low. SEEK IMMEDIATE MEDICAL CARE IF:  You develop a severe headache, blurred or changing vision, or confusion.  You have unusual weakness or numbness, or a faint feeling.  You have severe chest or abdominal pain, vomiting, or breathing  problems. MAKE SURE YOU:   Understand these instructions.  Will watch your condition.  Will get help right away if you are not doing well or get worse. Document Released: 10/26/2005 Document Revised: 01/18/2012 Document Reviewed: 06/15/2008 [facility] Patient Information 2014 [place].

## 2014-02-18 ENCOUNTER — Encounter: Payer: Self-pay | Admitting: Internal Medicine

## 2014-02-18 MED ORDER — OLMESARTAN MEDOXOMIL-HCTZ 40-12.5 MG PO TABS: 1.0000 | ORAL_TABLET | Freq: Every day | ORAL | Status: DC

## 2014-02-18 NOTE — Assessment & Plan Note (Signed)
Exam done Vaccines were reviewed Labs ordered  She was referred for a mammogram Pt ed material was given

## 2014-02-18 NOTE — Assessment & Plan Note (Signed)
This does not require treamtent

## 2014-02-18 NOTE — Assessment & Plan Note (Signed)
Her BP is well controlled Today, I will monitor her lytes and renal function

## 2014-02-27 ENCOUNTER — Telehealth: Payer: Self-pay | Admitting: Internal Medicine

## 2014-02-27 DIAGNOSIS — I10 Essential (primary) hypertension: Secondary | ICD-10-CM

## 2014-02-27 MED ORDER — OLMESARTAN MEDOXOMIL-HCTZ 40-12.5 MG PO TABS: 1.0000 | ORAL_TABLET | Freq: Every day | ORAL | Status: DC

## 2014-02-27 NOTE — Telephone Encounter (Signed)
Rx request for both 90 day and 30 day sent to pharmacy olmesartan-hydrochlorothiazide [facility] HCT) 40-12.5 MG per tablet (Discontinued) 90 tablet 3 02/27/2014 02/27/2014 Sig - Route: Take 1 tablet by mouth daily. - Oral Reason for Discontinue: Reorder E-Prescribing Status: Receipt confirmed by pharmacy (02/27/2014 8:52 AM EDT) Patient notified

## 2014-02-27 NOTE — Telephone Encounter (Signed)
Patient states that she called [facility] and was informed that they not received her Benicar prescription. She asks that we re-send her 90-day supply to them. She also needs a 30-day supply sent to [facility] on [address].

## 2014-02-28 ENCOUNTER — Telehealth: Payer: Self-pay | Admitting: Internal Medicine

## 2014-02-28 LAB — HM MAMMOGRAPHY: HM Mammogram: NORMAL

## 2014-02-28 NOTE — Telephone Encounter (Signed)
Relevant patient education assigned to patient using Emmi. ° °

## 2014-03-14 ENCOUNTER — Ambulatory Visit: Payer: 59 | Admitting: Physician Assistant

## 2014-03-27 ENCOUNTER — Ambulatory Visit (INDEPENDENT_AMBULATORY_CARE_PROVIDER_SITE_OTHER): Payer: 59 | Admitting: Internal Medicine

## 2014-03-27 ENCOUNTER — Encounter: Payer: Self-pay | Admitting: Internal Medicine

## 2014-03-27 VITALS — BP 126/78 | HR 69 | Temp 98.7°F | Resp 16 | Ht 64.0 in | Wt 182.5 lb

## 2014-03-27 DIAGNOSIS — T6391XA Toxic effect of contact with unspecified venomous animal, accidental (unintentional), initial encounter: Secondary | ICD-10-CM

## 2014-03-27 DIAGNOSIS — K5190 Ulcerative colitis, unspecified, without complications: Secondary | ICD-10-CM

## 2014-03-27 DIAGNOSIS — M2550 Pain in unspecified joint: Secondary | ICD-10-CM

## 2014-03-27 DIAGNOSIS — T63481A Toxic effect of venom of other arthropod, accidental (unintentional), initial encounter: Secondary | ICD-10-CM

## 2014-03-27 DIAGNOSIS — I10 Essential (primary) hypertension: Secondary | ICD-10-CM

## 2014-03-27 DIAGNOSIS — IMO0001 Reserved for inherently not codable concepts without codable children: Secondary | ICD-10-CM | POA: Insufficient documentation

## 2014-03-27 MED ORDER — CETIRIZINE HCL 10 MG PO TABS: 10.0000 mg | ORAL_TABLET | Freq: Every day | ORAL | Status: DC

## 2014-03-27 MED ORDER — CLOBETASOL PROPIONATE 0.05 % EX OINT: 1.0000 "application " | TOPICAL_OINTMENT | Freq: Two times a day (BID) | CUTANEOUS | Status: DC

## 2014-03-27 MED ORDER — TRAMADOL HCL 50 MG PO TABS: 50.0000 mg | ORAL_TABLET | Freq: Three times a day (TID) | ORAL | Status: DC | PRN

## 2014-03-27 NOTE — Patient Instructions (Signed)
Osteoarthritis Osteoarthritis is a disease that causes soreness and swelling (inflammation) of a joint. It occurs when the cartilage at the affected joint wears down. Cartilage acts as a cushion, covering the ends of bones where they meet to form a joint. Osteoarthritis is the most common form of arthritis. It often occurs in older people. The joints affected most often by this condition include those in the:  Ends of the fingers.  Thumbs.  Neck.  Lower back.  Knees.  Hips. CAUSES  Over time, the cartilage that covers the ends of bones begins to wear away. This causes bone to rub on bone, producing pain and stiffness in the affected joints.  RISK FACTORS Certain factors can increase your chances of having osteoarthritis, including:  Older age.  Excessive body weight.  Overuse of joints. SIGNS AND SYMPTOMS   Pain, swelling, and stiffness in the joint.  Over time, the joint may lose its normal shape.  Small deposits of bone (osteophytes) may grow on the edges of the joint.  Bits of bone or cartilage can break off and float inside the joint space. This may cause more pain and damage. DIAGNOSIS  Your health care provider will do a physical exam and ask about your symptoms. Various tests may be ordered, such as:  X-rays of the affected joint.  An MRI scan.  Blood tests to rule out other types of arthritis.  Joint fluid tests. This involves using a needle to draw fluid from the joint and examining the fluid under a microscope. TREATMENT  Goals of treatment are to control pain and improve joint function. Treatment plans may include:  A prescribed exercise program that allows for rest and joint relief.  A weight control plan.  Pain relief techniques, such as:  Properly applied heat and cold.  Electric pulses delivered to nerve endings under the skin (transcutaneous electrical nerve stimulation, TENS).  Massage.  Certain nutritional supplements.  Medicines to  control pain, such as:  Acetaminophen.  Nonsteroidal anti-inflammatory drugs (NSAIDs), such as naproxen.  Narcotic or central-acting agents, such as tramadol.  Corticosteroids. These can be given orally or as an injection.  Surgery to reposition the bones and relieve pain (osteotomy) or to remove loose pieces of bone and cartilage. Joint replacement may be needed in advanced states of osteoarthritis. HOME CARE INSTRUCTIONS   Only take over-the-counter or prescription medicines as directed by your health care provider. Take all medicines exactly as instructed.  Maintain a healthy weight. Follow your health care provider's instructions for weight control. This may include dietary instructions.  Exercise as directed. Your health care provider can recommend specific types of exercise. These may include:  Strengthening exercises These are done to strengthen the muscles that support joints affected by arthritis. They can be performed with weights or with exercise bands to add resistance.  Aerobic activities These are exercises, such as brisk walking or low-impact aerobics, that get your heart pumping.  Range-of-motion activities These keep your joints limber.  Balance and agility exercises These help you maintain daily living skills.  Rest your affected joints as directed by your health care provider.  Follow up with your health care provider as directed. SEEK MEDICAL CARE IF:   Your skin turns red.  You develop a rash in addition to your joint pain.  You have worsening joint pain. SEEK IMMEDIATE MEDICAL CARE IF:  You have a significant loss of weight or appetite.  You have a fever along with joint or muscle aches.  You have   night sweats. FOR MORE INFORMATION  National Institute of Arthritis and Musculoskeletal and Skin Diseases: www.niams.nih.gov National Institute on Aging: www.nia.nih.gov American College of Rheumatology: www.rheumatology.org Document Released: 10/26/2005  Document Revised: 08/16/2013 Document Reviewed: 07/03/2013 [employer] Patient Information 2014 [employer].  

## 2014-03-27 NOTE — Assessment & Plan Note (Signed)
Her BP is well controlled 

## 2014-03-27 NOTE — Progress Notes (Signed)
Pre visit review using our clinic review tool, if applicable. No additional management support is needed unless otherwise documented below in the visit note. 

## 2014-03-27 NOTE — Assessment & Plan Note (Signed)
With the pain she is experiencing I have asked her to see rheum to be evaluated for peripheral arthritis associated with UC

## 2014-03-27 NOTE — Progress Notes (Signed)
Subjective:    Patient ID: Latasha Wood, female    DOB: 12-03-1955, 58 y.o.   MRN: [id number]  Allergic Reaction This is a new problem. The current episode started 3 to 5 days ago. The problem occurs constantly. The problem is unchanged. The problem is mild. The patient was exposed to insect bit. The time of exposure was just prior to onset. The exposure occurred at home. Associated symptoms include itching and a rash (red area on the left hand). Pertinent negatives include no abdominal pain, chest pain, chest pressure, coughing, diarrhea, difficulty breathing, drooling, eye itching, eye redness, eye watering, globus sensation, hyperventilation, stridor, trouble swallowing, vomiting or wheezing. Swelling location: itching and swelling on the dorsum of the left hand. Past treatments include nothing. The treatment provided no relief. There is no history of asthma, atopic dermatitis, food allergies, medication allergies or seasonal allergies.      Review of Systems  Constitutional: Positive for fatigue. Negative for fever, chills, diaphoresis and appetite change.  HENT: Negative.  Negative for drooling and trouble swallowing.   Eyes: Negative.  Negative for redness and itching.  Respiratory: Negative.  Negative for cough, choking, chest tightness, shortness of breath, wheezing and stridor.   Cardiovascular: Negative.  Negative for chest pain, palpitations and leg swelling.  Gastrointestinal: Negative.  Negative for nausea, vomiting, abdominal pain, diarrhea, constipation and blood in stool.  Endocrine: Negative.   Genitourinary: Negative.   Musculoskeletal: Positive for arthralgias and myalgias. Negative for back pain, gait problem, joint swelling, neck pain and neck stiffness.       She stopped taking prednisone about 3 months ago and since then she has felt poorly with diffuse pain.  Skin: Positive for itching and rash (red area on the left hand).  Allergic/Immunologic: Negative.   Negative for food allergies.  Neurological: Negative.   Hematological: Negative.  Negative for adenopathy. Does not bruise/bleed easily.  Psychiatric/Behavioral: Negative.        Objective:   Physical Exam  Vitals reviewed. Constitutional: She is oriented to person, place, and time. She appears well-developed and well-nourished. No distress.  HENT:  Head: Normocephalic and atraumatic.  Mouth/Throat: Oropharynx is clear and moist. No oropharyngeal exudate.  Eyes: Conjunctivae are normal. Right eye exhibits no discharge. Left eye exhibits no discharge. No scleral icterus.  Neck: Normal range of motion. Neck supple. No JVD present. No tracheal deviation present. No thyromegaly present.  Cardiovascular: Normal rate, regular rhythm, normal heart sounds and intact distal pulses.  Exam reveals no gallop and no friction rub.   No murmur heard. Pulmonary/Chest: Effort normal and breath sounds normal. No stridor. No respiratory distress. She has no wheezes. She has no rales. She exhibits no tenderness.  Abdominal: Soft. Bowel sounds are normal. She exhibits no distension and no mass. There is no tenderness. There is no rebound and no guarding.  Musculoskeletal: Normal range of motion. She exhibits no edema and no tenderness.       Hands: Lymphadenopathy:    She has no cervical adenopathy.  Neurological: She is oriented to person, place, and time.  Skin: Skin is warm and dry. No lesion and no rash noted. Rash is not macular, not papular, not pustular and not vesicular. She is not diaphoretic. There is erythema. No pallor.  Psychiatric: She has a normal mood and affect. Her behavior is normal. Judgment and thought content normal.     Lab Results  Component Value Date   WBC 6.2 02/16/2014   HGB 12.3 02/16/2014  HCT 37.0 02/16/2014   PLT 312.0 02/16/2014   GLUCOSE 73 02/16/2014   CHOL 218* 02/16/2014   TRIG 82.0 02/16/2014   HDL 65.80 02/16/2014   LDLDIRECT 157.8 09/05/2012   LDLCALC 136*  02/16/2014   ALT 17 02/16/2014   AST 22 02/16/2014   NA 137 02/16/2014   K 3.7 02/16/2014   CL 103 02/16/2014   CREATININE 0.8 02/16/2014   BUN 12 02/16/2014   CO2 28 02/16/2014   TSH 0.48 02/16/2014       Assessment & Plan:

## 2014-03-27 NOTE — Assessment & Plan Note (Addendum)
I will check her labs for myopathy, inflammatory process, thyroid disease, metabolic disease, abnormal lytes, renal failure She will try tramadol for the pain

## 2014-03-27 NOTE — Assessment & Plan Note (Signed)
I will check her labs to see if there is concern for an inflammatory process She has a history of UC so have asked her to see rheum for further evaluation

## 2014-03-27 NOTE — Assessment & Plan Note (Signed)
Will treat with zyrtec and topical steroids

## 2014-03-29 ENCOUNTER — Encounter: Payer: Self-pay | Admitting: Internal Medicine

## 2014-04-03 ENCOUNTER — Encounter: Payer: Self-pay | Admitting: Internal Medicine

## 2014-04-03 ENCOUNTER — Other Ambulatory Visit: Payer: Self-pay | Admitting: Internal Medicine

## 2014-04-03 DIAGNOSIS — K5190 Ulcerative colitis, unspecified, without complications: Secondary | ICD-10-CM

## 2014-04-03 DIAGNOSIS — IMO0001 Reserved for inherently not codable concepts without codable children: Secondary | ICD-10-CM

## 2014-04-03 DIAGNOSIS — M2550 Pain in unspecified joint: Secondary | ICD-10-CM

## 2014-04-18 ENCOUNTER — Telehealth: Payer: Self-pay | Admitting: Internal Medicine

## 2014-04-18 NOTE — Telephone Encounter (Signed)
Pt states that she was trying to lose some weight and she thinks the roughage she was eating has caused her to flare. She states that for 2-3 weeks she has been having urgency, loose stools, and some blood in the stool. Pt states she started taking prednisone 6 days ago and her symptoms have gotten better. Pt states she took Prednisone 64m for 4 days and now she is taking 264m States she is taking her Humira and is "pretty much on schedule." Pt is requesting refill on prednisone. Please advise.

## 2014-04-18 NOTE — Telephone Encounter (Signed)
Please refill Prednisone 26m, #100, Take as directed  Decrease dose by 5 mg every 2 weeks starting at 342mday., 1 refill

## 2014-04-19 MED ORDER — PREDNISONE 10 MG PO TABS: ORAL_TABLET | ORAL | Status: DC

## 2014-04-19 NOTE — Telephone Encounter (Signed)
Spoke with patient and gave her recommendations as per Dr. Olevia Perches.

## 2014-04-19 NOTE — Telephone Encounter (Signed)
Rx sent. Left a message for patient to call me.

## 2014-05-19 ENCOUNTER — Other Ambulatory Visit: Payer: Self-pay | Admitting: Internal Medicine

## 2014-05-28 ENCOUNTER — Other Ambulatory Visit: Payer: Self-pay | Admitting: Internal Medicine

## 2014-05-29 ENCOUNTER — Telehealth: Payer: Self-pay | Admitting: Internal Medicine

## 2014-05-29 MED ORDER — ADALIMUMAB 40 MG/0.8ML ~~LOC~~ AJKT: 1.0000 "pen " | AUTO-INJECTOR | SUBCUTANEOUS | Status: DC

## 2014-05-29 NOTE — Telephone Encounter (Signed)
Rx sent to [facility]. Patient has scheduled a follow up appointment with Dr Olevia Perches on 07/10/14.

## 2014-06-11 ENCOUNTER — Ambulatory Visit (HOSPITAL_BASED_OUTPATIENT_CLINIC_OR_DEPARTMENT_OTHER): Payer: 59 | Admitting: Genetic Counselor

## 2014-06-11 ENCOUNTER — Other Ambulatory Visit: Payer: 59

## 2014-06-11 DIAGNOSIS — Z803 Family history of malignant neoplasm of breast: Secondary | ICD-10-CM | POA: Insufficient documentation

## 2014-06-11 DIAGNOSIS — IMO0002 Reserved for concepts with insufficient information to code with codable children: Secondary | ICD-10-CM

## 2014-06-11 NOTE — Progress Notes (Signed)
HISTORY OF PRESENT ILLNESS: Dr. Ronnald Ramp requested a cancer genetics consultation for Latasha Wood, a 58 y.o. female, due to a family history of breast cancer.  Latasha Wood presents to clinic today to discuss the possibility of a hereditary predisposition to cancer, genetic testing, and to further clarify her future cancer risks, as well as potential cancer risk for family members. Latasha Wood has no personal history of cancer.   Past Medical History  Diagnosis Date   Hypertension    Colitis, ulcerative    Hyperlipidemia    Colon polyps    Blood transfusion    Anemia     Past Surgical History  Procedure Laterality Date   Gravida 6 para 2      all SAB   Hyadiform mole     Dilation and curettage of uterus      most likely after miscarriage   Knee arthroscopy w/ meniscal repair      right knee ([place])   Fibrocystic breast disease      q 6 month mammogram   Abdominal hysterectomy  2000    History   Social History   Marital Status: Married    Spouse Name: N/A    Number of Children: 2   Years of Education: 31   Occupational History   customer relations     [employer]   Social History Main Topics   Smoking status: Never Smoker    Smokeless tobacco: Never Used   Alcohol Use: 1.0 oz/week    2 drink(s) per week     Comment: occ   Drug Use: No   Sexual Activity: Yes    Partners: Male   Other Topics Concern   Not on file   Social History Narrative   HS[employer], [employer] - [employer], [employer] for med tech.  Married '75. 2 dtrs - '79, '82; 1 grandchild. Marriage is in good health. No abuse issues.      FAMILY HISTORY:  During the visit, a 4-generation pedigree was obtained. Significant diagnoses include the following:  Family History  Problem Relation Age of Onset   Diabetes Mother    Breast cancer Mother 38   Diabetes Father    Heart disease Father    Diabetes Sister    Colon cancer Paternal Aunt    Diabetes Brother    Breast cancer Sister  48    Latasha Wood's ancestry is of African American descent. There is no known Jewish ancestry or consanguinity.  GENETIC COUNSELING ASSESSMENT: Latasha Wood is a 58 y.o. female with a family history of cancer suggestive of a hereditary predisposition to cancer. We, therefore, discussed and recommended the following at today's visit.   DISCUSSION: We reviewed the characteristics, features and inheritance patterns of hereditary cancer syndromes. We also discussed genetic testing, including the appropriate family members to test, the process of testing, insurance coverage and turn-around-time for results. We discussed the implications of a negative, positive and/or variant of uncertain significant result.  PLAN: Based on Latasha Wood family history, we recommended her sister, who was diagnosed with breast cancer at age 67, have genetic counseling and testing. We discussed that it is always most informative to initiate genetic testing in a family member diagnosed with cancer, but if this is not possible, we would then test an unaffected family member. Latasha Wood will speak with this family member and let us know if we can be of any assistance in coordinating genetic counseling and/or testing. If this family member is not interested  in testing, we would then recommend testing for Latasha Wood. In the meantime, we recommended Latasha Wood continue to follow the cancer screening guidelines given to he by her primary provider.  We also encouraged Latasha Wood to remain in contact with cancer genetics annually so that we can continuously update the family history and inform her of any changes in cancer genetics and testing that may be of benefit for this family. Ms.  Wood questions were answered to her satisfaction today. Our contact information was provided should additional questions or concerns arise.   Thank you for the referral and allowing [place] to share in the care of your patient.   The patient was  seen for a total of 45 minutes in face-to-face genetic counseling.  This patient was discussed with Magrinat who agrees with the above.    _______________________________________________________________________ For Office Staff:  Number of people involved in session: 2 Was an Intern/ student involved with case: not applicable

## 2014-07-10 ENCOUNTER — Ambulatory Visit: Payer: 59 | Admitting: Internal Medicine

## 2014-07-18 ENCOUNTER — Encounter: Payer: Self-pay | Admitting: Internal Medicine

## 2014-07-31 ENCOUNTER — Encounter: Payer: Self-pay | Admitting: Internal Medicine

## 2014-07-31 ENCOUNTER — Ambulatory Visit (INDEPENDENT_AMBULATORY_CARE_PROVIDER_SITE_OTHER): Payer: 59 | Admitting: Internal Medicine

## 2014-07-31 ENCOUNTER — Ambulatory Visit: Payer: 59 | Admitting: Internal Medicine

## 2014-07-31 VITALS — BP 134/86 | HR 80 | Ht 64.0 in | Wt 169.6 lb

## 2014-07-31 DIAGNOSIS — Z79899 Other long term (current) drug therapy: Secondary | ICD-10-CM

## 2014-07-31 DIAGNOSIS — IMO0001 Reserved for inherently not codable concepts without codable children: Secondary | ICD-10-CM

## 2014-07-31 DIAGNOSIS — D849 Immunodeficiency, unspecified: Secondary | ICD-10-CM

## 2014-07-31 DIAGNOSIS — K5190 Ulcerative colitis, unspecified, without complications: Secondary | ICD-10-CM

## 2014-07-31 DIAGNOSIS — M2550 Pain in unspecified joint: Secondary | ICD-10-CM

## 2014-07-31 MED ORDER — MOVIPREP 100 G PO SOLR: 1.0000 | Freq: Once | ORAL | Status: DC

## 2014-07-31 MED ORDER — MESALAMINE 1.2 G PO TBEC: DELAYED_RELEASE_TABLET | ORAL | Status: DC

## 2014-07-31 MED ORDER — TRAMADOL HCL 50 MG PO TABS: ORAL_TABLET | ORAL | Status: DC

## 2014-07-31 MED ORDER — PREDNISONE 10 MG PO TABS: ORAL_TABLET | ORAL | Status: DC

## 2014-07-31 NOTE — Patient Instructions (Signed)
You have been scheduled for a colonoscopy. Please follow written instructions given to you at your visit today.  Please pick up your prep kit at the pharmacy within the next 1-3 days. If you use inhalers (even only as needed), please bring them with you on the day of your procedure. Your physician has requested that you go to www.startemmi.com and enter the access code given to you at your visit today. This web site gives a general overview about your procedure. However, you should still follow specific instructions given to you by our office regarding your preparation for the procedure.  We have sent the following medications to your pharmacy for you to pick up at your convenience: Lialda Tramadol  CC:Dr Scarlette Calico

## 2014-07-31 NOTE — Progress Notes (Signed)
Latasha Wood Aug 13, 1956 [id number]  Note: This dictation was prepared with Dragon digital system. Any transcriptional errors that result from this procedure are unintentional.   History of Present Illness:  This is a 58 year old African American female with left-sided ulcerative colitis since 1980, with recent flareup. She started Humira 40 mg every 2 weeks in September 2014 and she is doing reasonably well. A recent flareup responded to a rapid prednisone taper. She is due for a recall colonoscopy. Her last full exam was in December 2010 and showed left-sided colitis. She had a flexible sigmoidoscopy in June 2012 which revealed active colitis from 0-40 cm. Biopsy consisted of  neutrophilic cryptitis consistent with chronic active colitis. She is currently on mesalamine 4.8 g daily and Humira. She discontinued 6-MP and prednisone.    Past Medical History  Diagnosis Date  . Hypertension   . Colitis, ulcerative   . Hyperlipidemia   . Colon polyps   . Blood transfusion   . Anemia     Past Surgical History  Procedure Laterality Date  . Gravida 6 para 2      all SAB  . Hyadiform mole    . Dilation and curettage of uterus      most likely after miscarriage  . Knee arthroscopy w/ meniscal repair      right knee (Daldorf)  . Fibrocystic breast disease      q 6 month mammogram  . Abdominal hysterectomy  2000    No Known Allergies  Family history and social history have been reviewed.  Review of Systems:   The remainder of the 10 point ROS is negative except as outlined in the H&P  Physical Exam: General Appearance Well developed, in no distress Psychological Normal mood and affect  Assessment and Plan:   Problem #31 58 year old [place] American female with stable ulcerative colitis predominantly in the left colon. Humira has been effective. She will continue the same dose. She needs a refill for tramadol, prednisone and mesalamine. We will set up colonoscopy at the end of  October 2015.    [name] 07/31/2014

## 2014-08-06 ENCOUNTER — Other Ambulatory Visit: Payer: Self-pay | Admitting: Internal Medicine

## 2014-08-13 ENCOUNTER — Encounter: Payer: Self-pay | Admitting: Internal Medicine

## 2014-08-31 ENCOUNTER — Encounter: Payer: 59 | Admitting: Internal Medicine

## 2014-10-16 ENCOUNTER — Telehealth: Payer: Self-pay | Admitting: Internal Medicine

## 2014-10-16 MED ORDER — ADALIMUMAB 40 MG/0.8ML ~~LOC~~ AJKT: 40.0000 mg | AUTO-INJECTOR | SUBCUTANEOUS | Status: DC

## 2014-10-16 NOTE — Telephone Encounter (Signed)
Rx sent. Patient needs colonoscopy for further refills.

## 2014-11-07 ENCOUNTER — Telehealth: Payer: Self-pay | Admitting: *Deleted

## 2014-11-07 DIAGNOSIS — K5190 Ulcerative colitis, unspecified, without complications: Secondary | ICD-10-CM

## 2014-11-07 NOTE — Telephone Encounter (Signed)
I called patient to remind her that she is due for a tb test in January. She will go to the lab for the tb gold quantiferon within the next couple of weeks. Patient tells me that over the holidays, she has starting having some loose stools and small amounts of blood. She has put herself on a prednisone taper and is currently on 25 mg. She wonders if she can have refills on cortenema's? Last rx for this was 06/2013. Please advise.

## 2014-11-21 ENCOUNTER — Telehealth: Payer: Self-pay | Admitting: *Deleted

## 2014-11-21 NOTE — Telephone Encounter (Signed)
Patient notified of need to have lab test for TB. She will come and get this done.

## 2014-11-27 ENCOUNTER — Telehealth: Payer: Self-pay | Admitting: Internal Medicine

## 2014-11-27 ENCOUNTER — Other Ambulatory Visit: Payer: Self-pay | Admitting: Internal Medicine

## 2014-11-27 DIAGNOSIS — Z79899 Other long term (current) drug therapy: Secondary | ICD-10-CM

## 2014-11-27 DIAGNOSIS — Z7960 Long term (current) use of unspecified immunomodulators and immunosuppressants: Secondary | ICD-10-CM

## 2014-11-28 NOTE — Telephone Encounter (Signed)
Left a message for patient to call back. 

## 2014-11-29 NOTE — Telephone Encounter (Signed)
Left a message for patient to call back. 

## 2014-12-04 NOTE — Telephone Encounter (Signed)
Dr. Olevia Perches patient would like to have her PPD performed at [facility].  Is it ok to order a quantiferon Gold to be performed at [facility]?

## 2014-12-07 MED ORDER — ADALIMUMAB 40 MG/0.8ML ~~LOC~~ AJKT: 40.0000 mg | AUTO-INJECTOR | SUBCUTANEOUS | Status: DC

## 2014-12-07 NOTE — Telephone Encounter (Signed)
Patient provided lab orders to [place].  She will go today.   Humira rx sent

## 2014-12-07 NOTE — Addendum Note (Signed)
Addended by: Marlon Pel on: 12/07/2014 11:43 AM   Modules accepted: Orders

## 2014-12-10 ENCOUNTER — Other Ambulatory Visit: Payer: Self-pay | Admitting: Internal Medicine

## 2014-12-12 LAB — QUANTIFERON IN TUBE
QUANTIFERON MITOGEN VALUE: 0.55 [IU]/mL
QUANTIFERON TB AG VALUE: 0.02 IU/mL
QUANTIFERON TB GOLD: NEGATIVE
Quantiferon Nil Value: 0.05 IU/mL

## 2014-12-12 LAB — QUANTIFERON TB GOLD ASSAY (BLOOD)

## 2015-01-18 ENCOUNTER — Other Ambulatory Visit: Payer: Self-pay | Admitting: Internal Medicine

## 2015-02-18 ENCOUNTER — Telehealth: Payer: Self-pay | Admitting: Internal Medicine

## 2015-02-18 NOTE — Telephone Encounter (Signed)
Left a message for patient to call back. 

## 2015-02-19 NOTE — Telephone Encounter (Signed)
Spoke with patient and she states starting having a flare 2-3 weeks ago. She is having diarrhea, bleeding, urgency, cramping and gas. States she gets up at night to the bathroom. She was off her Humira for 3 weeks but is back on it. It is due Monday. She is taking Lialda 4 tablets/daily. She has been on and off Prednisone 5 mg for a couple months. She has been taking 5 mg daily this week. She is almost out of Prednisone and is asking for a refill. Please, advise.

## 2015-02-19 NOTE — Telephone Encounter (Signed)
OK to refill Prednisone 10 mg, #1--, take as directed. IF she has a flare up,  She should start on  20 mg  For 1 week, then taper by 5 mg every week.

## 2015-02-20 MED ORDER — PREDNISONE 10 MG PO TABS: ORAL_TABLET | ORAL | Status: DC

## 2015-02-20 NOTE — Telephone Encounter (Signed)
Rx sent to pharmacy. Patient notified of recommendations.

## 2015-02-27 ENCOUNTER — Telehealth: Payer: Self-pay | Admitting: Internal Medicine

## 2015-02-28 NOTE — Telephone Encounter (Signed)
Dr Olevia Perches is out this week. Please advise me on this. Patient felt her symptoms were not improving on Prednisone 20 mg. She increased it to 40 mg for 4 days. She is decreasing it by 5 mg every week. She also took 6MP 27m for 3 days. She is having less stool urgency and down to 3 to 4 loose stools daily. She feels out of sorts and is really upset about this. She asks for refill of Xanax and guidance on the prednisone taper.

## 2015-02-28 NOTE — Telephone Encounter (Signed)
Spoke with patient and gave her Dr. Blanch Media recommendation. Scheduled with Nicoletta Ba, PA on 03/04/15 at 3:00 PM.

## 2015-02-28 NOTE — Telephone Encounter (Signed)
I don't have a good feel for her case. She has not been seen in the office for greater than 6 months. Multiple phone calls. Looks like she self managing her disease suboptimally I advised that she have an appointment with an extender this week or next, to see if we can help her. Can Not refill Xanax.

## 2015-03-04 ENCOUNTER — Ambulatory Visit (INDEPENDENT_AMBULATORY_CARE_PROVIDER_SITE_OTHER): Payer: 59 | Admitting: Physician Assistant

## 2015-03-04 ENCOUNTER — Other Ambulatory Visit (INDEPENDENT_AMBULATORY_CARE_PROVIDER_SITE_OTHER): Payer: 59

## 2015-03-04 ENCOUNTER — Encounter: Payer: Self-pay | Admitting: Physician Assistant

## 2015-03-04 VITALS — BP 112/72 | HR 70 | Ht 64.5 in | Wt 196.0 lb

## 2015-03-04 DIAGNOSIS — K51911 Ulcerative colitis, unspecified with rectal bleeding: Secondary | ICD-10-CM

## 2015-03-04 LAB — CBC WITH DIFFERENTIAL/PLATELET
BASOS ABS: 0 10*3/uL (ref 0.0–0.1)
BASOS PCT: 0.2 % (ref 0.0–3.0)
EOS ABS: 0 10*3/uL (ref 0.0–0.7)
Eosinophils Relative: 0 % (ref 0.0–5.0)
HCT: 37.3 % (ref 36.0–46.0)
HEMOGLOBIN: 12.4 g/dL (ref 12.0–15.0)
Lymphocytes Relative: 9.1 % — ABNORMAL LOW (ref 12.0–46.0)
Lymphs Abs: 1.2 10*3/uL (ref 0.7–4.0)
MCHC: 33.4 g/dL (ref 30.0–36.0)
MCV: 87.9 fl (ref 78.0–100.0)
Monocytes Absolute: 0.2 10*3/uL (ref 0.1–1.0)
Monocytes Relative: 1.5 % — ABNORMAL LOW (ref 3.0–12.0)
NEUTROS ABS: 11.3 10*3/uL — AB (ref 1.4–7.7)
Platelets: 388 10*3/uL (ref 150.0–400.0)
RBC: 4.24 Mil/uL (ref 3.87–5.11)
RDW: 14.4 % (ref 11.5–15.5)
WBC: 12.6 10*3/uL — AB (ref 4.0–10.5)

## 2015-03-04 LAB — BASIC METABOLIC PANEL
BUN: 24 mg/dL — AB (ref 6–23)
CO2: 28 mEq/L (ref 19–32)
Calcium: 9.3 mg/dL (ref 8.4–10.5)
Chloride: 102 mEq/L (ref 96–112)
Creatinine, Ser: 1 mg/dL (ref 0.40–1.20)
GFR: 72.74 mL/min (ref >60.00)
GLUCOSE: 120 mg/dL — AB (ref 70–99)
Potassium: 4.2 mEq/L (ref 3.5–5.1)
Sodium: 134 mEq/L — ABNORMAL LOW (ref 135–145)

## 2015-03-04 LAB — C-REACTIVE PROTEIN: CRP: 0.5 mg/dL (ref 0.5–20.0)

## 2015-03-04 MED ORDER — NA SULFATE-K SULFATE-MG SULF 17.5-3.13-1.6 GM/177ML PO SOLN: 1.0000 | Freq: Once | ORAL | Status: DC

## 2015-03-04 MED ORDER — PREDNISONE 10 MG PO TABS: ORAL_TABLET | ORAL | Status: DC

## 2015-03-04 MED ORDER — ALPRAZOLAM 0.25 MG PO TABS
0.2500 mg | ORAL_TABLET | Freq: Two times a day (BID) | ORAL | Status: DC | PRN
Start: 2015-03-04 — End: 2015-09-05

## 2015-03-04 MED ORDER — HYDROCORTISONE 100 MG/60ML RE ENEM: 100.0000 mg | ENEMA | Freq: Every day | RECTAL | Status: DC

## 2015-03-04 MED ORDER — GLYCOPYRROLATE 2 MG PO TABS: 2.0000 mg | ORAL_TABLET | Freq: Two times a day (BID) | ORAL | Status: DC

## 2015-03-04 NOTE — Progress Notes (Signed)
Patient ID: Latasha Wood, female   DOB: Mar 20, 1956, 59 y.o.   MRN: [id number]   Subjective:    Patient ID: Latasha Wood, female    DOB: September 04, 1956, 59 y.o.   MRN: [id number]  HPI Latasha Wood is a pleasant 59 year old African American female known to Latasha. Delfin Wood. She has had long-standing left-sided ulcerative colitis initially diagnosed in the late 70s. Her last full colonoscopy was in 2008. She had a sigmoidoscopy in 2012 which showed severe proctitis and colitis to 40 cm. Biopsies were consistent with chronic active colitis without dysplasia. Patient has been on Humira since 2014 and initially did well . He says she had a fairly good year last year but since the beginning of 2016 she has had ongoing active symptoms. She says she does not like giving herself for Humira injection but has been compliant with every 2 week injections this year. She has been on prednisone fairly long term because she has significant arthralgias and said if she gets below 5 mg her joints ache. Because of increasing problems with abdominal cramping pain diarrhea and rectal bleeding she went up to 20 mg of prednisone a month or so ago and when that wasn't working called in and was increased to 40 mg by mouth daily about 2 weeks ago. She says on 40 mg she's having less rectal bleeding and less diarrhea usually 3-4 bowel movements per day but is having side effects from the prednisone with jitteriness, anxiety difficulty concentrating etc. and says she's having a hard time working. She is also on Lialda 4.8 g daily. She actually took some old Purinethol that she had at home for a few days and then stopped it.  Review of Systems Pertinent positive and negative review of systems were noted in the above HPI section.  All other review of systems was otherwise negative.  Outpatient Encounter Prescriptions as of 03/04/2015  Medication Sig  . Adalimumab (HUMIRA PEN) 40 MG/0.8ML PNKT Inject 40 mg into the skin every 14  (fourteen) days. MUST HAVE COLONOSCOPY FOR FURTHER REFILLS!  . ALPRAZolam (XANAX) 0.25 MG tablet Take 1 tablet (0.25 mg total) by mouth at bedtime as needed.  Marland Kitchen BENICAR HCT 40-12.5 MG per tablet Take 1 tablet by mouth  daily  . calcium-vitamin D (OSCAL WITH D) 500-200 MG-UNIT per tablet Take 1 tablet by mouth daily.    . cetirizine (ZYRTEC) 10 MG tablet TAKE 1 TABLET (10 MG TOTAL) BY MOUTH DAILY.  . hydrocortisone (CORTENEMA) 100 MG/60ML enema Place 1 enema (100 mg total) rectally at bedtime.  . mercaptopurine (PURINETHOL) 50 MG tablet Take 1.5 tablets (75 mg) by mouth once daily.  . mesalamine (LIALDA) 1.2 G EC tablet Take 2 tablets by mouth every morning and 2 tablets by mouth every evening.  . mesalamine (LIALDA) 1.2 G EC tablet Take 2 tablets by mouth every morning and 2 tablets by mouth every evening.  . predniSONE (DELTASONE) 10 MG tablet Take 60m every AM  . traMADol (ULTRAM) 50 MG tablet Take 1/2-1 tablet by mouth every 6 hours as needed  . [DISCONTINUED] hydrocortisone (CORTENEMA) 100 MG/60ML enema Place 1 enema (100 mg total) rectally at bedtime.  . [DISCONTINUED] predniSONE (DELTASONE) 10 MG tablet Take as directed  . ALPRAZolam (XANAX) 0.25 MG tablet Take 1 tablet (0.25 mg total) by mouth 2 (two) times daily as needed for anxiety.  .Marland Kitchenglycopyrrolate (ROBINUL) 2 MG tablet Take 1 tablet (2 mg total) by mouth 2 (two) times daily.  . Na Sulfate-K  Sulfate-Mg Sulf (SUPREP BOWEL PREP) SOLN Take 1 kit by mouth once.  . [DISCONTINUED] MOVIPREP 100 G SOLR Take 1 kit (200 g total) by mouth once.  . [DISCONTINUED] predniSONE (DELTASONE) 10 MG tablet Take as directed.   No Known Allergies Patient Active Problem List   Diagnosis Date Noted  . Family history of malignant neoplasm of breast 06/11/2014  . Arthralgia 03/27/2014  . Myalgia and myositis 03/27/2014  . Allergic reaction to insect sting 03/27/2014  . Other screening mammogram 02/16/2014  . Routine health maintenance 09/06/2012  .  Hyperlipidemia LDL goal < 130 04/17/2011  . Anxiety 03/24/2011  . HYPERTENSION 12/30/2007  . COLITIS, ULCERATIVE 08/08/2007   History   Social History  . Marital Status: Married    Spouse Name: N/A  . Number of Children: 2  . Years of Education: 18   Occupational History  . customer relations     [employer]   Social History Main Topics  . Smoking status: Never Smoker   . Smokeless tobacco: Never Used  . Alcohol Use: 1.0 oz/week    2 drink(s) per week     Comment: occ  . Drug Use: No  . Sexual Activity:    Partners: Male   Other Topics Concern  . Not on file   Social History Narrative   HSG, [employer] - BA, [employer] for med tech.  Married '75. 2 dtrs - '79, '82; 1 grandchild. Marriage is in good health. No abuse issues.     Latasha Wood's family history includes Breast cancer (age of onset: 45) in her sister; Breast cancer (age of onset: 31) in her mother; Colon cancer in her paternal aunt; Diabetes in her brother, father, mother, and sister; Heart disease in her father.      Objective:    Filed Vitals:   03/04/15 1512  BP: 112/72  Pulse: 70    Physical Exam   well-developed African American female in no acute distress, pleasant blood pressure 112/72 pulse 70 height 5 foot 4 weight 196. HEENT; nontraumatic normocephalic EOMI PERRLA sclera anicteric, Cardiovascular ;regular rate and rhythm with S1-S2 no murmur or gallop, Pulmonary ;clear bilaterally, Abdomen; soft she is tender in the left mid and left lower quadrant no guarding or rebound no palpable mass or hepatosplenomegaly bowel sounds are present, Rectal; exam not done, Extremities; no clubbing cyanosis or edema skin warm and dry, Psych; mood and affect appropriate, though  she is anxious       Assessment & Plan:  #1 59 yo female with long standing left sided ulcerative colitis  - Poorly controlled on Humira currently and requiring steroids. ? Humira Ab 's, ? Compliance  #2 anxiety #3 arthralgias #4  HTN   Plan; Schedule for Colonoscopy with Latasha Wood both for surveillance and to assess disease activity Decrease Prednisone to 30 mg po daily, and stay on that dose until colonoscopy Refill xanax 0.25 mg po BID prn Refill cortenema qhs Continue Lialda 4.8 gm daily  will start robinul forte 2 mg po BID Pt will think about taking a short leave from work and will call back Cbc, BMET CRP and Humira AB level and concentration- may need to change Biologic Rx     Latasha Wood Genia Harold PA-C 03/04/2015   Cc: Janith Lima, MD

## 2015-03-04 NOTE — Patient Instructions (Addendum)
You have been scheduled for a colonoscopy. Please follow written instructions given to you at your visit today.  Please pick up your prep supplies at the pharmacy within the next 1-3 days. If you use inhalers (even only as needed), please bring them with you on the day of your procedure. Your physician has requested that you go to www.startemmi.com and enter the access code given to you at your visit today. This web site gives a general overview about your procedure. However, you should still follow specific instructions given to you by our office regarding your preparation for the procedure.  Your prescriptions will be sent to your pharmacy Continue Humeria every 14 days Continue Lialda 4.8g daily Use Cort-enemas every night at bedtime  Use Prednisone 74m every AM

## 2015-03-04 NOTE — Progress Notes (Signed)
Reviewed and agree. I would not expect 6MP to work if she took only few pills. Colonoscopy is the right way to go. WILL NEED AN OV TO CONSIDER OTHER OPTIONS SUCH AS COLECTOMY- HAS HAD THE DISEASE over 30 years.

## 2015-03-05 ENCOUNTER — Encounter: Payer: Self-pay | Admitting: *Deleted

## 2015-03-11 ENCOUNTER — Encounter: Payer: Self-pay | Admitting: Internal Medicine

## 2015-03-16 LAB — ADALIMUMAB+AB (SERIAL MONITOR): Anti-Adalimumab Antibody: 10070 ng/mL — ABNORMAL HIGH

## 2015-03-18 LAB — HM MAMMOGRAPHY: HM MAMMO: NORMAL

## 2015-03-21 ENCOUNTER — Encounter: Payer: Self-pay | Admitting: Internal Medicine

## 2015-04-01 ENCOUNTER — Encounter: Payer: Self-pay | Admitting: Internal Medicine

## 2015-04-01 ENCOUNTER — Ambulatory Visit (AMBULATORY_SURGERY_CENTER): Payer: 59 | Admitting: Internal Medicine

## 2015-04-01 VITALS — BP 142/91 | HR 65 | Temp 97.0°F | Resp 20 | Ht 64.0 in | Wt 196.0 lb

## 2015-04-01 DIAGNOSIS — K529 Noninfective gastroenteritis and colitis, unspecified: Secondary | ICD-10-CM | POA: Diagnosis not present

## 2015-04-01 DIAGNOSIS — K51911 Ulcerative colitis, unspecified with rectal bleeding: Secondary | ICD-10-CM | POA: Diagnosis not present

## 2015-04-01 MED ORDER — SODIUM CHLORIDE 0.9 % IV SOLN: 500.0000 mL | INTRAVENOUS | Status: DC

## 2015-04-01 NOTE — Patient Instructions (Signed)
YOU HAD AN ENDOSCOPIC PROCEDURE TODAY AT [facility]:   Refer to the procedure report that was given to you for any specific questions about what was found during the examination.  If the procedure report does not answer your questions, please call your gastroenterologist to clarify.  If you requested that your care partner not be given the details of your procedure findings, then the procedure report has been included in a sealed envelope for you to review at your convenience later.  YOU SHOULD EXPECT: Some feelings of bloating in the abdomen. Passage of more gas than usual.  Walking can help get rid of the air that was put into your GI tract during the procedure and reduce the bloating. If you had a lower endoscopy (such as a colonoscopy or flexible sigmoidoscopy) you may notice spotting of blood in your stool or on the toilet paper. If you underwent a bowel prep for your procedure, you may not have a normal bowel movement for a few days.  Please Note:  You might notice some irritation and congestion in your nose or some drainage.  This is from the oxygen used during your procedure.  There is no need for concern and it should clear up in a day or so.  SYMPTOMS TO REPORT IMMEDIATELY:   Following lower endoscopy (colonoscopy or flexible sigmoidoscopy):  Excessive amounts of blood in the stool  Significant tenderness or worsening of abdominal pains  Swelling of the abdomen that is new, acute  Fever of 100F or higher   For urgent or emergent issues, a gastroenterologist can be reached at any hour by calling [phone].   DIET: Your first meal following the procedure should be a small meal and then it is ok to progress to your normal diet. Heavy or fried foods are harder to digest and may make you feel nauseous or bloated.  Likewise, meals heavy in dairy and vegetables can increase bloating.  Drink plenty of fluids but you should avoid alcoholic beverages for 24  hours.  ACTIVITY:  You should plan to take it easy for the rest of today and you should NOT DRIVE or use heavy machinery until tomorrow (because of the sedation medicines used during the test).    FOLLOW UP: Our staff will call the number listed on your records the next business day following your procedure to check on you and address any questions or concerns that you may have regarding the information given to you following your procedure. If we do not reach you, we will leave a message.  However, if you are feeling well and you are not experiencing any problems, there is no need to return our call.  We will assume that you have returned to your regular daily activities without incident.  If any biopsies were taken you will be contacted by phone or by letter within the next 1-3 weeks.  Please call us at [phone] if you have not heard about the biopsies in 3 weeks.    SIGNATURES/CONFIDENTIALITY: You and/or your care partner have signed paperwork which will be entered into your electronic medical record.  These signatures attest to the fact that that the information above on your After Visit Summary has been reviewed and is understood.  Full responsibility of the confidentiality of this discharge information lies with you and/or your care-partner.  Next colonoscopy determined by pathology results. Continue current medications.

## 2015-04-01 NOTE — Progress Notes (Signed)
A/ox3 pleased with MAC, report to [employer]

## 2015-04-01 NOTE — Progress Notes (Signed)
Called to room to assist during endoscopic procedure.  Patient ID and intended procedure confirmed with present staff. Received instructions for my participation in the procedure from the performing physician.  

## 2015-04-01 NOTE — Op Note (Signed)
[place]  [address] [place], [id number]   COLONOSCOPY PROCEDURE REPORT  PATIENT: Latasha Wood, Latasha Wood  MR#: [id number] BIRTHDATE: 21-Mar-1956 , 59  yrs. old GENDER: female ENDOSCOPIST: Lafayette Dragon, MD REFERRED VU:YEBXID Evalina Field, M.D. PROCEDURE DATE:  04/01/2015 PROCEDURE:   Colonoscopy, surveillance and Colonoscopy with biopsy First Screening Colonoscopy - Avg.  risk and is 50 yrs.  old or older - No.  Prior Negative Screening - Now for repeat screening. N/A  History of Adenoma - Now for follow-up colonoscopy & has been > or = to 3 yrs.  N/A  Polyps removed today? No Recommend repeat exam, <10 yrs? Yes high risk ASA CLASS:   Class II INDICATIONS:Inflammatory bowel disease of the intestine if more precise diagnosis or determination of the extent / severity of activity of disease will influence immediate / future management.  MEDICATIONS: Monitored anesthesia care and Propofol 290 mg IV  DESCRIPTION OF PROCEDURE:   After the risks benefits and alternatives of the procedure were thoroughly explained, informed consent was obtained.  The digital rectal exam revealed no abnormalities of the rectum.   The LB CF-[id number] and LB PCF [id number] [id number]  endoscope was introduced through the anus and advanced to the cecum, which was identified by both the appendix and ileocecal valve. No adverse events experienced.   The quality of the prep was good.  (MoviPrep was used)  The instrument was then slowly withdrawn as the colon was fully examined. Estimated blood loss is zero unless otherwise noted in this procedure report.      COLON FINDINGS: A diffuse patch of bleeding abnormal mucosa was found throughout the entire examined colon.the most severe changes were found in the left colon also around hepatic flexure  The mucosa was friable, edematous, erythematous and had granularity. This was likely consistent with ulcerative colitis disease.was tpaucity of haustral  folds in the left colon  Multiple random biopsies were performed using cold forceps. from ascending #1, transvers#2, descending(50-70 cm#4, and sigmoid colon (0-40 cm) Sample was obtained and sent to histology.  Retroflexion was not performed due to a narrow rectal vault. The time to cecum = 6.04 Withdrawal time = 10.11   The scope was withdrawn and the procedure completed. COMPLICATIONS: There were no immediate complications.  ENDOSCOPIC IMPRESSION: ulcerative colitis, involving the rectosigmoid, descending, transverse and ascending colon .Diffuse abnormal mucosa was found throughout the entire examined colon; The mucosa was bleeding, friable, edematous, erythematous and had granularity; multiple random biopsies were performed using cold forceps  RECOMMENDATIONS: 1.  Await pathology results 2.  Continue steroid taper 3.Continue Humira 4.Continue mesalamine 5.Follow-up office visit discussed possibility of total colectomy 6.recall colonoscopy pending path report probably in 2-3 years eSigned:  Lafayette Dragon, MD 04/01/2015 10:07 AM   cc:   PATIENT NAME:  Latasha Wood, Latasha Wood MR#: [id number]

## 2015-04-02 ENCOUNTER — Telehealth: Payer: Self-pay

## 2015-04-02 NOTE — Telephone Encounter (Signed)
  Follow up Call-  Call back number 04/01/2015  Post procedure Call Back phone  # [phone]  Permission to leave phone message Yes     Patient questions:  Do you have a fever, pain , or abdominal swelling? No. Pain Score  0 *  Have you tolerated food without any problems? Yes.    Have you been able to return to your normal activities? Yes.    Do you have any questions about your discharge instructions: Diet   No. Medications  No. Follow up visit  No.  Do you have questions or concerns about your Care? No.  Actions: * If pain score is 4 or above: No action needed, pain <4.  No problems per the pt. maw

## 2015-04-04 ENCOUNTER — Encounter: Payer: Self-pay | Admitting: Internal Medicine

## 2015-04-09 LAB — HM COLONOSCOPY

## 2015-04-16 ENCOUNTER — Telehealth: Payer: Self-pay | Admitting: *Deleted

## 2015-04-16 ENCOUNTER — Encounter: Payer: Self-pay | Admitting: Internal Medicine

## 2015-04-16 ENCOUNTER — Ambulatory Visit (INDEPENDENT_AMBULATORY_CARE_PROVIDER_SITE_OTHER): Payer: 59 | Admitting: Internal Medicine

## 2015-04-16 VITALS — BP 120/80 | HR 72 | Ht 64.0 in | Wt 195.6 lb

## 2015-04-16 DIAGNOSIS — D849 Immunodeficiency, unspecified: Secondary | ICD-10-CM | POA: Diagnosis not present

## 2015-04-16 DIAGNOSIS — K51911 Ulcerative colitis, unspecified with rectal bleeding: Secondary | ICD-10-CM | POA: Diagnosis not present

## 2015-04-16 DIAGNOSIS — D899 Disorder involving the immune mechanism, unspecified: Secondary | ICD-10-CM

## 2015-04-16 NOTE — Progress Notes (Signed)
Latasha Wood Aug 31, 1956 [id number]  Note: This dictation was prepared with Dragon digital system. Any transcriptional errors that result from this procedure are unintentional.   History of Present Illness: This is a 59 year old African-American female with the history of ulcerative colitis since 10s ,most often left-sided but  currently pancolitis. She underwent surveillence colonoscopy last month which showed active pancolitis   Biopsies from the right, transverse, left colon showed mild colitis. Biopsies from sigmoid: Rectum showed moderately severe colitis. There was no dysplasia. She has been on Humira x 2 years and has developed Humira antibodies 1: 10070 titer., Humira level was undetectable. Indicating that her biological is not effective. She denies rectal bleeding, abdominal pain or diarrhea. She has been on prednisone taperfrom 30 mg/day to10 mg today. She is also on mesalamine 2.4 g daily 6-mercaptopurine 75 mg daily and Humira.40 mg q 2 weeks. She is having minimal symptoms.   Past Medical History  Diagnosis Date  . Hypertension   . Colitis, ulcerative   . Hyperlipidemia   . Colon polyps   . Blood transfusion   . Anemia     Past Surgical History  Procedure Laterality Date  . Gravida 6 para 2      all SAB  . Hyadiform mole    . Dilation and curettage of uterus      most likely after miscarriage  . Knee arthroscopy w/ meniscal repair      right knee (Daldorf)  . Fibrocystic breast disease      q 6 month mammogram  . Abdominal hysterectomy  2000    No Known Allergies  Family history and social history have been reviewed.  Review of Systems: Denies rectal bleeding or abdominal pain  The remainder of the 10 point ROS is negative except as outlined in the H&P  Physical Exam: General Appearance Well developed, in no distress, cushingoid Psychological Normal mood and affect She came to discuss ongoing plans.  Assessment and Plan:   59 year old  African-American female with  almost 56 year history of ulcerative colitis. No evidence of dysplasia on most recent colonoscopy. But her colitis has remain active off and on most of the time that I been following her. She is now on biologicals and we will have to switch her to another biological because of antibodies against Humira. She had numerous courses of prednisone over the years . I asked her to come today with her husband to discuss possibility of total colectomy. She is open to the suggestion and would like  to have  more information  on different types of surgical options.. Have spent a lot of time with her today to discuss the advantages and disadvantages of ileostomy versus ileoproctostomy. We will set her up for consultation regarding  ostomy appliance care.. We will also refer for surgical opinion. I encourage her to stay in [facility] for the surgery rather than going to a tertiary facility, advantage of having a local care at this point. We will try to  preauthorize for a new biological which her insurance prefers. The are not sure yet whether Entyvio, Cimzia or Symponi. She will continue on the prednisone taper.     [name] 04/16/2015

## 2015-04-16 NOTE — Telephone Encounter (Signed)
Spoke with Melody at [facility] wound nurse office and the only person that works outpatient basis is Administrator, Civil Service at [employer]. Called and gave patient the number for her [phone].

## 2015-04-16 NOTE — Patient Instructions (Addendum)
Your appointment with Dr. Leighton Ruff at [facility] is on 05/14/15 at 10:30am. Please arrive at 10:00am for registration. Please bring your insurance cards and medications to your appointment. If you need to reschedule or cancel, please contact their office at [phone].  cc: Leighton Ruff, MD, Dr Scarlette Calico

## 2015-05-01 ENCOUNTER — Telehealth: Payer: Self-pay | Admitting: *Deleted

## 2015-05-01 NOTE — Telephone Encounter (Signed)
Spoke with patient and told her [employer] has approved Simponi.She will call when she receives it to schedule time to administer it for first time.

## 2015-05-14 ENCOUNTER — Telehealth: Payer: Self-pay | Admitting: *Deleted

## 2015-05-14 NOTE — Telephone Encounter (Signed)
OK to refill Prednisone. Continue to taper.

## 2015-05-14 NOTE — Telephone Encounter (Signed)
Called patient to see if she had received her medications. She has not received them yet and she is going to call Simponi today to check on this. Patient states she is currently on Prednisone 25 mg and needs a refill. She is trying to taper down. Is it ok to refill?

## 2015-05-15 MED ORDER — PREDNISONE 10 MG PO TABS: ORAL_TABLET | ORAL | Status: DC

## 2015-05-15 NOTE — Telephone Encounter (Signed)
Script refilled, pt aware.

## 2015-05-17 ENCOUNTER — Emergency Department (HOSPITAL_COMMUNITY): Payer: 59

## 2015-05-17 ENCOUNTER — Encounter (HOSPITAL_COMMUNITY): Payer: Self-pay

## 2015-05-17 ENCOUNTER — Telehealth: Payer: Self-pay | Admitting: Internal Medicine

## 2015-05-17 ENCOUNTER — Inpatient Hospital Stay (HOSPITAL_COMMUNITY)
Admission: EM | Admit: 2015-05-17 | Discharge: 2015-05-19 | DRG: 389 | Disposition: A | Discharge status: Home / Self Care | Payer: 59 | Attending: Internal Medicine | Admitting: Internal Medicine

## 2015-05-17 DIAGNOSIS — I10 Essential (primary) hypertension: Secondary | ICD-10-CM | POA: Diagnosis present

## 2015-05-17 DIAGNOSIS — K51912 Ulcerative colitis, unspecified with intestinal obstruction: Secondary | ICD-10-CM | POA: Diagnosis present

## 2015-05-17 DIAGNOSIS — R109 Unspecified abdominal pain: Secondary | ICD-10-CM

## 2015-05-17 DIAGNOSIS — Z90710 Acquired absence of both cervix and uterus: Secondary | ICD-10-CM

## 2015-05-17 DIAGNOSIS — D72829 Elevated white blood cell count, unspecified: Secondary | ICD-10-CM | POA: Diagnosis present

## 2015-05-17 DIAGNOSIS — E785 Hyperlipidemia, unspecified: Secondary | ICD-10-CM | POA: Diagnosis present

## 2015-05-17 DIAGNOSIS — T380X5A Adverse effect of glucocorticoids and synthetic analogues, initial encounter: Secondary | ICD-10-CM | POA: Diagnosis present

## 2015-05-17 DIAGNOSIS — Z86010 Personal history of colonic polyps: Secondary | ICD-10-CM

## 2015-05-17 DIAGNOSIS — K5669 Other intestinal obstruction: Secondary | ICD-10-CM

## 2015-05-17 DIAGNOSIS — K565 Intestinal adhesions [bands] with obstruction (postprocedural) (postinfection): Principal | ICD-10-CM | POA: Diagnosis present

## 2015-05-17 DIAGNOSIS — K529 Noninfective gastroenteritis and colitis, unspecified: Secondary | ICD-10-CM

## 2015-05-17 DIAGNOSIS — K56609 Unspecified intestinal obstruction, unspecified as to partial versus complete obstruction: Secondary | ICD-10-CM | POA: Diagnosis present

## 2015-05-17 DIAGNOSIS — R1013 Epigastric pain: Secondary | ICD-10-CM

## 2015-05-17 LAB — I-STAT CHEM 8, ED
BUN: 19 mg/dL (ref 6–20)
Calcium, Ion: 1.13 mmol/L (ref 1.13–1.30)
Chloride: 102 mmol/L (ref 101–111)
Creatinine, Ser: 1 mg/dL (ref 0.44–1.00)
Glucose, Bld: 132 mg/dL — ABNORMAL HIGH (ref 65–99)
HCT: 40 % (ref 36.0–46.0)
Hemoglobin: 13.6 g/dL (ref 12.0–15.0)
Potassium: 3.5 mmol/L (ref 3.5–5.1)
Sodium: 139 mmol/L (ref 135–145)
TCO2: 29 mmol/L (ref 0–100)

## 2015-05-17 LAB — URINALYSIS, ROUTINE W REFLEX MICROSCOPIC
Bilirubin Urine: NEGATIVE
Glucose, UA: NEGATIVE mg/dL
Hgb urine dipstick: NEGATIVE
Ketones, ur: NEGATIVE mg/dL
Leukocytes, UA: NEGATIVE
Nitrite: NEGATIVE
Protein, ur: NEGATIVE mg/dL
Specific Gravity, Urine: 1.016 (ref 1.005–1.030)
Urobilinogen, UA: 0.2 mg/dL (ref 0.0–1.0)
pH: 8 (ref 5.0–8.0)

## 2015-05-17 LAB — TSH: TSH: 1.123 u[IU]/mL (ref 0.350–4.500)

## 2015-05-17 LAB — CBC WITH DIFFERENTIAL/PLATELET
Basophils Absolute: 0 10*3/uL (ref 0.0–0.1)
Basophils Relative: 0 % (ref 0–1)
Eosinophils Absolute: 0 10*3/uL (ref 0.0–0.7)
Eosinophils Relative: 0 % (ref 0–5)
HCT: 37.2 % (ref 36.0–46.0)
Hemoglobin: 12.7 g/dL (ref 12.0–15.0)
Lymphocytes Relative: 14 % (ref 12–46)
Lymphs Abs: 3.2 10*3/uL (ref 0.7–4.0)
MCH: 29.8 pg (ref 26.0–34.0)
MCHC: 34.1 g/dL (ref 30.0–36.0)
MCV: 87.3 fL (ref 78.0–100.0)
Monocytes Absolute: 0.9 10*3/uL (ref 0.1–1.0)
Monocytes Relative: 4 % (ref 3–12)
Neutro Abs: 18.9 10*3/uL — ABNORMAL HIGH (ref 1.7–7.7)
Neutrophils Relative %: 82 % — ABNORMAL HIGH (ref 43–77)
Platelets: 338 10*3/uL (ref 150–400)
RBC: 4.26 MIL/uL (ref 3.87–5.11)
RDW: 13.3 % (ref 11.5–15.5)
WBC: 23.1 10*3/uL — ABNORMAL HIGH (ref 4.0–10.5)

## 2015-05-17 MED ORDER — HYDROMORPHONE HCL 1 MG/ML IJ SOLN
1.0000 mg | Freq: Once | INTRAMUSCULAR | Status: AC
  Administered 2015-05-17: 1 mg via INTRAVENOUS
  Filled 2015-05-17: qty 1

## 2015-05-17 MED ORDER — HYDRALAZINE HCL 20 MG/ML IJ SOLN
5.0000 mg | Freq: Two times a day (BID) | INTRAMUSCULAR | Status: DC
  Administered 2015-05-17 – 2015-05-19 (×4): 5 mg via INTRAVENOUS
  Filled 2015-05-17: qty 0.25
  Filled 2015-05-17: qty 1
  Filled 2015-05-17 (×3): qty 0.25

## 2015-05-17 MED ORDER — HYDROMORPHONE HCL 1 MG/ML IJ SOLN
1.0000 mg | INTRAMUSCULAR | Status: DC | PRN
  Administered 2015-05-17: 1 mg via INTRAVENOUS

## 2015-05-17 MED ORDER — HYDRALAZINE HCL 20 MG/ML IJ SOLN: 5.0000 mg | Freq: Three times a day (TID) | INTRAMUSCULAR | Status: DC

## 2015-05-17 MED ORDER — METHYLPREDNISOLONE SODIUM SUCC 40 MG IJ SOLR
20.0000 mg | Freq: Every day | INTRAMUSCULAR | Status: DC
  Administered 2015-05-17 – 2015-05-19 (×3): 20 mg via INTRAVENOUS
  Filled 2015-05-17 (×3): qty 0.5

## 2015-05-17 MED ORDER — HYDRALAZINE HCL 20 MG/ML IJ SOLN
5.0000 mg | Freq: Two times a day (BID) | INTRAMUSCULAR | Status: DC
  Administered 2015-05-17: 5 mg via INTRAVENOUS
  Filled 2015-05-17: qty 1

## 2015-05-17 MED ORDER — HYDROMORPHONE HCL 1 MG/ML IJ SOLN
1.0000 mg | INTRAMUSCULAR | Status: AC | PRN
  Filled 2015-05-17: qty 1

## 2015-05-17 MED ORDER — ENOXAPARIN SODIUM 30 MG/0.3ML ~~LOC~~ SOLN: 30.0000 mg | SUBCUTANEOUS | Status: DC

## 2015-05-17 MED ORDER — HYDROMORPHONE HCL 2 MG/ML IJ SOLN
2.0000 mg | INTRAMUSCULAR | Status: DC | PRN
  Administered 2015-05-17 – 2015-05-18 (×3): 2 mg via INTRAVENOUS
  Filled 2015-05-17 (×4): qty 1

## 2015-05-17 MED ORDER — ONDANSETRON HCL 4 MG/2ML IJ SOLN
4.0000 mg | Freq: Four times a day (QID) | INTRAMUSCULAR | Status: DC | PRN
  Administered 2015-05-17 – 2015-05-18 (×2): 4 mg via INTRAVENOUS
  Filled 2015-05-17 (×2): qty 2

## 2015-05-17 MED ORDER — SODIUM CHLORIDE 0.9 % IV SOLN
INTRAVENOUS | Status: DC
  Administered 2015-05-18: 22:00:00 via INTRAVENOUS

## 2015-05-17 MED ORDER — IOHEXOL 300 MG/ML  SOLN
100.0000 mL | Freq: Once | INTRAMUSCULAR | Status: AC | PRN
  Administered 2015-05-17: 100 mL via INTRAVENOUS

## 2015-05-17 MED ORDER — IOHEXOL 300 MG/ML  SOLN
25.0000 mL | Freq: Once | INTRAMUSCULAR | Status: AC | PRN
  Administered 2015-05-17: 25 mL via ORAL

## 2015-05-17 MED ORDER — METRONIDAZOLE IN NACL 5-0.79 MG/ML-% IV SOLN
500.0000 mg | Freq: Once | INTRAVENOUS | Status: AC
  Administered 2015-05-17: 500 mg via INTRAVENOUS
  Filled 2015-05-17: qty 100

## 2015-05-17 MED ORDER — ONDANSETRON HCL 4 MG/2ML IJ SOLN
4.0000 mg | Freq: Once | INTRAMUSCULAR | Status: AC
  Administered 2015-05-17: 4 mg via INTRAVENOUS
  Filled 2015-05-17: qty 2

## 2015-05-17 MED ORDER — ENOXAPARIN SODIUM 40 MG/0.4ML ~~LOC~~ SOLN
40.0000 mg | Freq: Every day | SUBCUTANEOUS | Status: DC
  Administered 2015-05-17 – 2015-05-19 (×3): 40 mg via SUBCUTANEOUS
  Filled 2015-05-17 (×4): qty 0.4

## 2015-05-17 MED ORDER — ONDANSETRON HCL 4 MG/2ML IJ SOLN
4.0000 mg | Freq: Three times a day (TID) | INTRAMUSCULAR | Status: AC | PRN
  Filled 2015-05-17: qty 2

## 2015-05-17 MED ORDER — METRONIDAZOLE IN NACL 5-0.79 MG/ML-% IV SOLN
500.0000 mg | Freq: Three times a day (TID) | INTRAVENOUS | Status: DC
  Administered 2015-05-17 – 2015-05-19 (×6): 500 mg via INTRAVENOUS
  Filled 2015-05-17 (×7): qty 100

## 2015-05-17 MED ORDER — CIPROFLOXACIN IN D5W 400 MG/200ML IV SOLN
400.0000 mg | Freq: Once | INTRAVENOUS | Status: AC
  Administered 2015-05-17: 400 mg via INTRAVENOUS
  Filled 2015-05-17: qty 200

## 2015-05-17 MED ORDER — ONDANSETRON HCL 4 MG PO TABS: 4.0000 mg | ORAL_TABLET | Freq: Four times a day (QID) | ORAL | Status: DC | PRN

## 2015-05-17 MED ORDER — CIPROFLOXACIN IN D5W 400 MG/200ML IV SOLN
400.0000 mg | Freq: Two times a day (BID) | INTRAVENOUS | Status: DC
Start: 2015-05-17 — End: 2015-05-19
  Administered 2015-05-17 – 2015-05-19 (×5): 400 mg via INTRAVENOUS
  Filled 2015-05-17 (×5): qty 200

## 2015-05-17 MED ORDER — SODIUM CHLORIDE 0.9 % IV SOLN
Freq: Once | INTRAVENOUS | Status: AC
  Administered 2015-05-17: 03:00:00 via INTRAVENOUS

## 2015-05-17 NOTE — ED Notes (Signed)
Pt states that she's vomited twice

## 2015-05-17 NOTE — ED Notes (Signed)
Pt has a hx of ulcerative colitis and has been meeting with a surgeon about surgery, nothing scheduled, tonight she complains of severe abdominal pain and cramping

## 2015-05-17 NOTE — H&P (Addendum)
[facility] History and Physical  Latasha Wood FXT:[id number] DOB: 01/19/1956 DOA: 05/17/2015  Referring physician: ER physician / PA; Barbaraann Rondo PCP: Scarlette Calico, MD  Chief Complaint: abdominal pain  HPI:  59 year old female with a past medical history of ulcerative colitis (follows with Dr. [name]), was on Humira (recently switched to new medication unsure of exact name), she is on daily prednisone 30 mg, hypertension who presented to [facility] with worsening intermittent abdominal pain over past couple of days prior to this admission, mostly in epigastric area, cramp like, associated with nausea and nonbloody vomiting. She did have a bowel movement last evening. No blood in the stool. No diarrhea. No reports of fevers or chills. No other complaints such as chest pain, shortness of breath or palpitations although occasionally she may have shortness of breath and leg swelling. No lightheadedness or dizziness.  In ED, patient was hemodynamically stable. Her white blood cell count was 23.1 otherwise unremarkable. X-ray of the abdomen and CT of the abdomen demonstrated small bowel obstruction from stricture or adhesion in the pelvis which is the same site of obstruction as on imaging studies in 2012. Surgery has seen the patient in consultation.   Assessment & Plan    Principal problem: Abdominal pain / small bowel obstruction / Nausea and vomiting  - Patient admitted with abdominal pain. Her imaging studies, x-ray and CT scan, demonstrated small bowel obstruction in the pelvis likely from strictures or adhesions. These are similar to prior study in 2012. - Surgery has seen the patient in consultation. Plan is for nasogastric tube placement. - Continue supportive care with fluids, nothing by mouth. - Continue antiemetics and analgesia as needed  - Patient started on empiric ciprofloxacin and Flagyl. Will see with surgery patient needs this. I think leukocytosis is  coming from prednisone. No acute infection seen on CT scan.  Active Problems: Leukocytosis - As mentioned above, likely from prednisone. - Patient is on daily prednisone for ulcerative colitis. We will continue Solu-Medrol 20 mg IV while she is nothing by mouth.  Essential hypertension - Benicar on hold because patient is nothing by mouth. - Start hydralazine 5 mg every 12 hours IV.   Ulcerative colitis with intestinal obstruction  - Continue Solu-Medrol 20 mg IV daily - Other by mouth medications for ulcerative colitis on hold until patient able to tolerate by mouth intake.   DVT prophylaxis:  - Lovenox subQ ordered   Radiological Exams on Admission: Ct Abdomen Pelvis W Contrast 05/17/2015  1. Small bowel obstruction from stricture or adhesion in the pelvis. This is the same site of obstruction as in 2012. 2. Pancreas divisum.    Dg Abd Acute W/chest 05/17/2015    Fluid-filled proximal small bowel as can be seen with enteritis or partial obstruction.     Code Status: Full Family Communication: Plan of care discussed with the patient and her husband at the bedside  Disposition Plan: Admit for further evaluation, medical unit   Latasha Lenz, MD  [facility] Pager [phone]  Time spent in minutes: 75 minutes  Review of Systems:  Constitutional: Negative for fever, chills and malaise/fatigue. Negative for diaphoresis.  HENT: Negative for hearing loss, ear pain, nosebleeds, congestion, sore throat, neck pain, tinnitus and ear discharge.   Eyes: Negative for blurred vision, double vision, photophobia, pain, discharge and redness.  Respiratory: Negative for cough, hemoptysis, sputum production, shortness of breath, wheezing and stridor.   Cardiovascular: Negative for chest pain, palpitations, orthopnea, claudication  and leg swelling.  Gastrointestinal: per HPI.  Genitourinary: Negative for dysuria, urgency, frequency, hematuria and flank pain.  Musculoskeletal: Negative for  myalgias, back pain, joint pain and falls.  Skin: Negative for itching and rash.  Neurological: Negative for dizziness and weakness. Negative for tremors, sensory change, speech change, focal weakness, loss of consciousness and headaches.  Endo/Heme/Allergies: Negative for environmental allergies and polydipsia. Does not bruise/bleed easily.  Psychiatric/Behavioral: Negative for suicidal ideas. The patient is not nervous/anxious.      Past Medical History  Diagnosis Date  . Hypertension   . Colitis, ulcerative   . Hyperlipidemia   . Colon polyps   . Blood transfusion   . Anemia    Past Surgical History  Procedure Laterality Date  . Gravida 6 para 2      all SAB  . Hyadiform mole    . Dilation and curettage of uterus      most likely after miscarriage  . Knee arthroscopy w/ meniscal repair      right knee (Daldorf)  . Fibrocystic breast disease      q 6 month mammogram  . Abdominal hysterectomy  2000   Social History:  reports that she has never smoked. She has never used smokeless tobacco. She reports that she drinks about 1.0 oz of alcohol per week. She reports that she does not use illicit drugs.  No Known Allergies  Family History:  Family History  Problem Relation Age of Onset  . Diabetes Mother   . Breast cancer Mother 42  . Diabetes Father   . Heart disease Father   . Diabetes Sister   . Colon cancer Paternal Aunt   . Diabetes Brother   . Breast cancer Sister 78     Prior to Admission medications   Medication Sig Start Date End Date Taking? Authorizing Provider  ALPRAZolam (XANAX) 0.25 MG tablet Take 1 tablet (0.25 mg total) by mouth 2 (two) times daily as needed for anxiety. 03/04/15  Yes Amy S Esterwood, PA-C  BENICAR HCT 40-12.5 MG per tablet Take 1 tablet by mouth  daily 01/18/15  Yes Janith Lima, MD  calcium-vitamin D (OSCAL WITH D) 500-200 MG-UNIT per tablet Take 1 tablet by mouth daily.     Yes Historical Provider, MD  glycopyrrolate (ROBINUL) 2 MG  tablet Take 1 tablet (2 mg total) by mouth 2 (two) times daily. 03/04/15  Yes Amy S Esterwood, PA-C  mesalamine (LIALDA) 1.2 G EC tablet Take 2 tablets by mouth every morning and 2 tablets by mouth every evening. 07/31/14  Yes Lafayette Dragon, MD  predniSONE (DELTASONE) 10 MG tablet Take 73m every AM 05/15/15  Yes DLafayette Dragon MD  traMADol (Veatrice Bourbon 50 MG tablet Take 1/2-1 tablet by mouth every 6 hours as needed 07/31/14  Yes DLafayette Dragon MD   Physical Exam: Filed Vitals:   05/17/15 0120 05/17/15 0423 05/17/15 0632 05/17/15 0830  BP: 158/92 149/95 143/89 118/104  Pulse: 88 67 66 68  Temp: 98.2 F (36.8 C)     TempSrc: Oral     Resp: 20 18 16 18   SpO2: 97% 99% 99% 99%    Physical Exam  Constitutional: Appears well-developed and well-nourished. No distress.  HENT: Normocephalic. No tonsillar erythema or exudates Eyes: Conjunctivae and EOM are normal. PERRLA, no scleral icterus.  Neck: Normal ROM. Neck supple. No JVD. No tracheal deviation. No thyromegaly.  CVS: RRR, S1/S2 +, no murmurs, no gallops, no carotid bruit.  Pulmonary: Effort and breath  sounds normal, no stridor, rhonchi, wheezes, rales.  Abdominal: Soft. BS +,  no distension, tenderness in upper to mid abdomen to palpation, no rebound or guarding.  Musculoskeletal: Normal range of motion. No edema and no tenderness.  Lymphadenopathy: No lymphadenopathy noted, cervical, inguinal. Neuro: Alert. Normal reflexes, muscle tone coordination. No focal neurologic deficits. Skin: Skin is warm and dry. No rash noted.  No erythema. No pallor.  Psychiatric: Normal mood and affect. Behavior, judgment, thought content normal.   Labs on Admission:  Basic Metabolic Panel:  Recent Labs Lab 05/17/15 0312  NA 139  K 3.5  CL 102  GLUCOSE 132*  BUN 19  CREATININE 1.00   Liver Function Tests: No results for input(s): AST, ALT, ALKPHOS, BILITOT, PROT, ALBUMIN in the last 168 hours. No results for input(s): LIPASE, AMYLASE in the last 168  hours. No results for input(s): AMMONIA in the last 168 hours. CBC:  Recent Labs Lab 05/17/15 0303 05/17/15 0312  WBC 23.1*  --   NEUTROABS 18.9*  --   HGB 12.7 13.6  HCT 37.2 40.0  MCV 87.3  --   PLT 338  --    Cardiac Enzymes: No results for input(s): CKTOTAL, CKMB, CKMBINDEX, TROPONINI in the last 168 hours. BNP: Invalid input(s): POCBNP CBG: No results for input(s): GLUCAP in the last 168 hours.  If 7PM-7AM, please contact night-coverage [email or web address] Password [email or web address] 05/17/2015, 9:59 AM

## 2015-05-17 NOTE — ED Provider Notes (Signed)
CSN: [id number]     Arrival date & time 05/17/15  0112 History   First MD Initiated Contact with Patient 05/17/15 0203     Chief Complaint  Patient presents with  . Abdominal Pain     (Consider location/radiation/quality/duration/timing/severity/associated sxs/prior Treatment) HPI Comments: This is 59 year old female with a history of ulcerative colitis that she's had most of her adult life.  She's had numerous CT scans several bowel obstructions and several hospitalizations due to this disease.  She is followed extensively by Dr. Elicia Lamp, GI, most recently, she had a consultation with Dr. Grandville Silos from [facility] surgery, outlining her alternatives and treatment options. He is becoming resistant to you.  Marrow as she's never been up antibodies and its lost his effectiveness.  She does take 10 mg of prednisone daily.  Rarely takes pain medicine.  She's had nausea but no vomiting.  She's not noted any blood in her stool in the past 2 weeks  Patient is a 59 y.o. female presenting with abdominal pain.  Abdominal Pain Pain location:  Generalized Pain quality: cramping   Pain radiates to:  Back Pain severity:  Severe Onset quality:  Gradual Timing:  Constant Progression:  Worsening Chronicity:  Recurrent Relieved by:  Nothing Worsened by:  Bowel movements, movement and palpation Associated symptoms: nausea   Associated symptoms: no chills, no constipation, no cough, no diarrhea, no dysuria, no fever and no vomiting     Past Medical History  Diagnosis Date  . Hypertension   . Colitis, ulcerative   . Hyperlipidemia   . Colon polyps   . Blood transfusion   . Anemia    Past Surgical History  Procedure Laterality Date  . Gravida 6 para 2      all SAB  . Hyadiform mole    . Dilation and curettage of uterus      most likely after miscarriage  . Knee arthroscopy w/ meniscal repair      right knee (Daldorf)  . Fibrocystic breast disease      q 6 month mammogram  .  Abdominal hysterectomy  2000   Family History  Problem Relation Age of Onset  . Diabetes Mother   . Breast cancer Mother 49  . Diabetes Father   . Heart disease Father   . Diabetes Sister   . Colon cancer Paternal Aunt   . Diabetes Brother   . Breast cancer Sister 73   History  Substance Use Topics  . Smoking status: Never Smoker   . Smokeless tobacco: Never Used  . Alcohol Use: 1.0 oz/week    2 Standard drinks or equivalent per week     Comment: occ   OB History    No data available     Review of Systems  Constitutional: Negative for fever and chills.  Respiratory: Negative for cough.   Gastrointestinal: Positive for nausea and abdominal pain. Negative for vomiting, diarrhea and constipation.  Genitourinary: Negative for dysuria.  Neurological: Negative for dizziness and headaches.      Allergies  Review of patient's allergies indicates no known allergies.  Home Medications   Prior to Admission medications   Medication Sig Start Date End Date Taking? Authorizing Provider  ALPRAZolam (XANAX) 0.25 MG tablet Take 1 tablet (0.25 mg total) by mouth 2 (two) times daily as needed for anxiety. 03/04/15  Yes Amy S Esterwood, PA-C  BENICAR HCT 40-12.5 MG per tablet Take 1 tablet by mouth  daily 01/18/15  Yes Janith Lima, MD  calcium-vitamin D (OSCAL WITH D) 500-200 MG-UNIT per tablet Take 1 tablet by mouth daily.     Yes Historical Provider, MD  glycopyrrolate (ROBINUL) 2 MG tablet Take 1 tablet (2 mg total) by mouth 2 (two) times daily. 03/04/15  Yes Amy S Esterwood, PA-C  mesalamine (LIALDA) 1.2 G EC tablet Take 2 tablets by mouth every morning and 2 tablets by mouth every evening. 07/31/14  Yes Lafayette Dragon, MD  predniSONE (DELTASONE) 10 MG tablet Take 84m every AM 05/15/15  Yes DLafayette Dragon MD  traMADol (Veatrice Bourbon 50 MG tablet Take 1/2-1 tablet by mouth every 6 hours as needed 07/31/14  Yes DLafayette Dragon MD  Adalimumab (HUMIRA PEN) 40 MG/0.8ML PNKT Inject 40 mg into the  skin every 14 (fourteen) days. MUST HAVE COLONOSCOPY FOR FURTHER REFILLS! Patient not taking: Reported on 05/17/2015 12/07/14   DLafayette Dragon MD  cetirizine (ZYRTEC) 10 MG tablet TAKE 1 TABLET (10 MG TOTAL) BY MOUTH DAILY. Patient not taking: Reported on 05/17/2015    TJanith Lima MD  hydrocortisone (CORTENEMA) 100 MG/60ML enema Place 1 enema (100 mg total) rectally at bedtime. Patient not taking: Reported on 05/17/2015 03/04/15   Amy S Esterwood, PA-C  mercaptopurine (PURINETHOL) 50 MG tablet Take 1.5 tablets (75 mg) by mouth once daily. Patient not taking: Reported on 05/17/2015 10/19/12   DLafayette Dragon MD   BP 152/77 mmHg  Pulse 78  Temp(Src) 98.8 F (37.1 C) (Oral)  Resp 18  Ht 5' 3"  (1.6 m)  Wt 190 lb (86.183 kg)  BMI 33.67 kg/m2  SpO2 99% Physical Exam  Constitutional: She is oriented to person, place, and time. She appears well-developed and well-nourished.  HENT:  Head: Normocephalic.  Eyes: Pupils are equal, round, and reactive to light.  Neck: Normal range of motion.  Cardiovascular: Normal rate and regular rhythm.   Pulmonary/Chest: Effort normal and breath sounds normal.  Abdominal: Soft. She exhibits no distension. Bowel sounds are increased. There is no hepatosplenomegaly. There is generalized tenderness. There is no guarding.  Musculoskeletal: Normal range of motion.  Neurological: She is alert and oriented to person, place, and time.  Skin: Skin is warm and dry.  Psychiatric: Her behavior is normal.  Nursing note and vitals reviewed.   ED Course  Procedures (including critical care time) Labs Review Labs Reviewed  CBC WITH DIFFERENTIAL/PLATELET - Abnormal; Notable for the following:    WBC 23.1 (*)    Neutrophils Relative % 82 (*)    Neutro Abs 18.9 (*)    All other components within normal limits  COMPREHENSIVE METABOLIC PANEL - Abnormal; Notable for the following:    Sodium 134 (*)    Chloride 100 (*)    Calcium 8.8 (*)    All other components within normal  limits  CBC - Abnormal; Notable for the following:    WBC 18.2 (*)    Hemoglobin 11.4 (*)    HCT 34.9 (*)    All other components within normal limits  I-STAT CHEM 8, ED - Abnormal; Notable for the following:    Glucose, Bld 132 (*)    All other components within normal limits  URINALYSIS, R[facility]  [facility]  GLUCOSE, CAPILLARY    Imaging Review Ct Abdomen Pelvis W Contrast  05/17/2015   CLINICAL DATA:  Severe abdominal pain.  Leukocytosis.  EXAM: CT ABDOMEN AND PELVIS WITH CONTRAST  TECHNIQUE: Multidetector CT imaging of the abdomen and pelvis was performed using the  standard protocol following bolus administration of intravenous contrast.  CONTRAST:  100 cc Omnipaque 300 intravenous  COMPARISON:  06/24/2011  FINDINGS: BODY WALL: No contributory findings.  LOWER CHEST: No contributory findings.  ABDOMEN/PELVIS:  Liver: Benign sub cm low-density in the left liver on image 15, essentially stable from 2012.  Biliary: No evidence of biliary obstruction or stone.  Pancreas: Duct anatomy consistent with divisum. There is chronic prominence of the main and accessory pancreatic ducts measuring up to 3 mm. No active inflammatory change.  Spleen: Unremarkable.  Adrenals: Unremarkable.  Kidneys and ureters: No hydronephrosis or stone. Stable benign cyst in the right kidney.  Bladder: Unremarkable.  Reproductive: Hysterectomy and probable oophorectomies.  Bowel: Dilated fluid-filled bowel with small reactive ascites and mesenteric congestion, leading to a focal transition point in the pelvis on image 62. No pneumoperitoneum or pneumatosis. This is most likely stricture or adhesion. No active inflammatory enteritis is noted in this patient with history of ulcerative colitis. No appendicitis.  Retroperitoneum: No mass or adenopathy.  Vascular: No acute abnormality.  OSSEOUS: No acute abnormalities. Lower lumbar facet arthropathy. No sacroiliitis.  IMPRESSION: 1. Small bowel obstruction  from stricture or adhesion in the pelvis. This is the same site of obstruction as in 2012. 2. Pancreas divisum.   Electronically Signed   By: Monte Fantasia M.D.   On: 05/17/2015 05:42   Dg Abd 2 Views  05/18/2015   CLINICAL DATA:  Abdominal pain, weakness, NG tube  EXAM: ABDOMEN - 2 VIEW  COMPARISON:  CT abdomen pelvis dated 05/17/2015  FINDINGS: Nonobstructive bowel gas pattern.  Contrast in the distal small bowel and right colon.  No evidence of free air under the diaphragm on the upright view.  Enteric tube terminates in the gastric cardia.  Visualized osseous structures are within normal limits.  IMPRESSION: No evidence of small bowel obstruction or free air.  Enteric tube terminates in the gastric cardia.   Electronically Signed   By: Julian Hy M.D.   On: 05/18/2015 09:08   Dg Abd Acute W/chest  05/17/2015   CLINICAL DATA:  Abdominal pain.  History of ulcerative colitis.  EXAM: DG ABDOMEN ACUTE W/ 1V CHEST  COMPARISON:  06/25/2011  FINDINGS: Bubbles of gas within in the left abdomen are linearly arranged. This could reflect an enteritis or partial obstruction. No evidence of bowel distention. No high-grade bowel obstruction is suspected as there is no decompression of colon. No evidence of bowel perforation. No concerning intra-abdominal mass effect or calcification.  Normal heart size and mediastinal contours. No acute infiltrate or edema. No effusion or pneumothorax. No acute osseous findings.  IMPRESSION: Fluid-filled proximal small bowel as can be seen with enteritis or partial obstruction.   Electronically Signed   By: Monte Fantasia M.D.   On: 05/17/2015 03:34     EKG Interpretation None     Spoke with DR; Scherrie Merritts- surgery who will consult with patient today  Spoke with patient with result of CT Scan and need for admission  MDM   Final diagnoses:  Abdominal pain  Colitis  Small bowel obstruction         Junius Creamer, NP 05/17/15 1165  Junius Creamer, NP 05/18/15  1955  Linton Flemings, MD 05/20/15 1810

## 2015-05-17 NOTE — Progress Notes (Signed)
Pt known to me with standing ( >30 year hx )  UC, recently refractory to steroids and biologicals. She is in the process of switching from Humira ( because of positive Adalimumab antibody 1:100070) to Symponi. We will hold Symponi induction regimen till SBO resolves, this may take several weeks. I will see her in the office to start the induction regimen ( She has received the first shipment today).. She also has made a commitment to undergo preventive  a total colectomy to reduce the risk of colon cancer , please see Dr Joyice Faster office note from this week.  The colectomy may not take place  till December 2016 because she is very  much steroid dependent and will need to be off steroids and biologicals completely for 6 weeks prior to surgery.Today, she may need extra steroids to cover for stress.  Dr Henrene Pastor is on GI call this weekend and may answer UC related questions. I have seen the pt tonight because she requested  to see me but we will not do formal consult unless asked.   Marisa Severin MD [facility] [phone]

## 2015-05-17 NOTE — ED Notes (Signed)
Bed: WA06 Expected date:  Expected time:  Means of arrival:  Comments: 

## 2015-05-17 NOTE — Progress Notes (Signed)
ANTIBIOTIC CONSULT NOTE - INITIAL  Pharmacy Consult for Cipro Indication: intra-abdominal infection  No Known Allergies  Patient Measurements:     Vital Signs: Temp: 98.2 F (36.8 C) (07/08 0120) Temp Source: Oral (07/08 0120) BP: 118/104 mmHg (07/08 0830) Pulse Rate: 68 (07/08 0830) Intake/Output from previous day: 07/07 0701 - 07/08 0700 In: -  Out: 600 [Emesis/NG output:600] Intake/Output from this shift: Total I/O In: 200 [IV Piggyback:200] Out: -   Labs:  Recent Labs  05/17/15 0303 05/17/15 0312  WBC 23.1*  --   HGB 12.7 13.6  PLT 338  --   CREATININE  --  1.00   CrCl cannot be calculated (Unknown ideal weight.). No results for input(s): VANCOTROUGH, VANCOPEAK, VANCORANDOM, GENTTROUGH, GENTPEAK, GENTRANDOM, TOBRATROUGH, TOBRAPEAK, TOBRARND, AMIKACINPEAK, AMIKACINTROU, AMIKACIN in the last 72 hours.   Microbiology: No results found for this or any previous visit (from the past 720 hour(s)).  Medical History: Past Medical History  Diagnosis Date  . Hypertension   . Colitis, ulcerative   . Hyperlipidemia   . Colon polyps   . Blood transfusion   . Anemia     Assessment: 1 yoF with ulcerative colitis presents with abdominal pain, N/V.  MD start Cipro/Flagyl for for intra-abdominal infection.   Pharmacy consulted to dose Cipro.  Tmax: afebrile WBC: 23.1 (note on prednisone 30 mg daily PTA) Renal: SCr 1.0, CrCl~82 ml/min  Goal of Therapy:  Doses adjusted per renal function Eradication of infection  Plan:  Cipro 400 mg IV q12h. F/u SCr, clinical course.  Hershal Coria 05/17/2015,9:19 AM

## 2015-05-17 NOTE — ED Notes (Signed)
Patient transported to CT 

## 2015-05-17 NOTE — Consult Note (Signed)
Reason for Consult: ulcerative colitis with a small bowel obstruction Referring Physician: Dr. Leisa Lenz   HPI: Latasha Wood is a 59 year old female with a long history of ulcerative colitis and hypertension presented to [facility] last night with worsening abdominal pain.  She has been doing fairly well for the past 2-3 days and abruptly developed burping type pain to the epigastric region which worsened and became more cramping and intermittent pain with time.  Moderate in severity.  Associated with nausea and vomiting.  Denies fever, chills or sweats.  Had a bowel movement last night, non bloody or diarrhea.  Appetite has been adequate.  She has gained weight over the past year.  She has been on prednisone for some time, now taking 6m once daily.  She is currently off Humira and has been approved for a different biologic, but has not started it yet.  Recently seen by Dr. BOlevia Perchesand had active colitis on colonoscopy, antibodies to Humira and numerous prednisone tapers.  She was then referred to our office. The patient was seen by Dr. AJoyice Fasteron 7/5 and surgery was discussed, but they had not come to a decision just yet.    Off note, the patient reports lower extremity edema, daytime somnolence, fatigue and shortness of breath.  She has been on Humira as mentioned above.  Denies any cardiac history and has not had any cardiac work up(ie echo) since 2011 and no sleep study.  Denies chest pains or PND.  TSH 5/15 was normal.    Past Medical History  Diagnosis Date  . Hypertension   . Colitis, ulcerative   . Hyperlipidemia   . Colon polyps   . Blood transfusion   . Anemia     Past Surgical History  Procedure Laterality Date  . Gravida 6 para 2      all SAB  . Hyadiform mole    . Dilation and curettage of uterus      most likely after miscarriage  . Knee arthroscopy w/ meniscal repair      right knee ([place])  . Fibrocystic breast disease      q 6 month mammogram  . Abdominal  hysterectomy  2000    Family History  Problem Relation Age of Onset  . Diabetes Mother   . Breast cancer Mother 546 . Diabetes Father   . Heart disease Father   . Diabetes Sister   . Colon cancer Paternal Aunt   . Diabetes Brother   . Breast cancer Sister 325   Social History:  reports that she has never smoked. She has never used smokeless tobacco. She reports that she drinks about 1.0 oz of alcohol per week. She reports that she does not use illicit drugs.  Allergies: No Known Allergies  Medications:  Prior to Admission medications   Medication Sig Start Date End Date Taking? Authorizing Provider  ALPRAZolam (XANAX) 0.25 MG tablet Take 1 tablet (0.25 mg total) by mouth 2 (two) times daily as needed for anxiety. 03/04/15  Yes Amy S Esterwood, PA-C  BENICAR HCT 40-12.5 MG per tablet Take 1 tablet by mouth  daily 01/18/15  Yes TJanith Lima MD  calcium-vitamin D (OSCAL WITH D) 500-200 MG-UNIT per tablet Take 1 tablet by mouth daily.     Yes Historical Provider, MD  glycopyrrolate (ROBINUL) 2 MG tablet Take 1 tablet (2 mg total) by mouth 2 (two) times daily. 03/04/15  Yes Amy S Esterwood, PA-C  mesalamine (LIALDA) 1.2 G  EC tablet Take 2 tablets by mouth every morning and 2 tablets by mouth every evening. 07/31/14  Yes Lafayette Dragon, MD  predniSONE (DELTASONE) 10 MG tablet Take 72m every AM 05/15/15  Yes DLafayette Dragon MD  traMADol (Veatrice Bourbon 50 MG tablet Take 1/2-1 tablet by mouth every 6 hours as needed 07/31/14  Yes DLafayette Dragon MD  Adalimumab (HUMIRA PEN) 40 MG/0.8ML PNKT Inject 40 mg into the skin every 14 (fourteen) days. MUST HAVE COLONOSCOPY FOR FURTHER REFILLS! Patient not taking: Reported on 05/17/2015 12/07/14   DLafayette Dragon MD  cetirizine (ZYRTEC) 10 MG tablet TAKE 1 TABLET (10 MG TOTAL) BY MOUTH DAILY. Patient not taking: Reported on 05/17/2015    TJanith Lima MD  hydrocortisone (CORTENEMA) 100 MG/60ML enema Place 1 enema (100 mg total) rectally at bedtime. Patient not taking:  Reported on 05/17/2015 03/04/15   Amy S Esterwood, PA-C  mercaptopurine (PURINETHOL) 50 MG tablet Take 1.5 tablets (75 mg) by mouth once daily. Patient not taking: Reported on 05/17/2015 10/19/12   DLafayette Dragon MD     Results for orders placed or performed during the hospital encounter of 05/17/15 (from the past 48 hour(s))  Urinalysis, [facility]     Status: None   Collection Time: 05/17/15  2:20 AM  Result Value Ref Range   Color, Urine YELLOW YELLOW   APPearance CLEAR CLEAR   Specific Gravity, Urine 1.016 1.005 - 1.030   pH 8.0 5.0 - 8.0   Glucose, UA NEGATIVE NEGATIVE mg/dL   Hgb urine dipstick NEGATIVE NEGATIVE   Bilirubin Urine NEGATIVE NEGATIVE   Ketones, ur NEGATIVE NEGATIVE mg/dL   Protein, ur NEGATIVE NEGATIVE mg/dL   Urobilinogen, UA 0.2 0.0 - 1.0 mg/dL   Nitrite NEGATIVE NEGATIVE   Leukocytes, UA NEGATIVE NEGATIVE    Comment: MICROSCOPIC NOT DONE ON URINES WITH NEGATIVE PROTEIN, BLOOD, LEUKOCYTES, NITRITE, OR GLUCOSE <1000 mg/dL.  CBC with Differential     Status: Abnormal   Collection Time: 05/17/15  3:03 AM  Result Value Ref Range   WBC 23.1 (H) 4.0 - 10.5 K/uL   RBC 4.26 3.87 - 5.11 MIL/uL   Hemoglobin 12.7 12.0 - 15.0 g/dL   HCT 37.2 36.0 - 46.0 %   MCV 87.3 78.0 - 100.0 fL   MCH 29.8 26.0 - 34.0 pg   MCHC 34.1 30.0 - 36.0 g/dL   RDW 13.3 11.5 - 15.5 %   Platelets 338 150 - 400 K/uL   Neutrophils Relative % 82 (H) 43 - 77 %   Neutro Abs 18.9 (H) 1.7 - 7.7 K/uL   Lymphocytes Relative 14 12 - 46 %   Lymphs Abs 3.2 0.7 - 4.0 K/uL   Monocytes Relative 4 3 - 12 %   Monocytes Absolute 0.9 0.1 - 1.0 K/uL   Eosinophils Relative 0 0 - 5 %   Eosinophils Absolute 0.0 0.0 - 0.7 K/uL   Basophils Relative 0 0 - 1 %   Basophils Absolute 0.0 0.0 - 0.1 K/uL  I-stat chem 8, ed     Status: Abnormal   Collection Time: 05/17/15  3:12 AM  Result Value Ref Range   Sodium 139 135 - 145 mmol/L   Potassium 3.5 3.5 - 5.1 mmol/L   Chloride 102 101 -  111 mmol/L   BUN 19 6 - 20 mg/dL   Creatinine, Ser 1.00 0.44 - 1.00 mg/dL   Glucose, Bld 132 (H) 65 - 99 mg/dL  Calcium, Ion 1.13 1.13 - 1.30 mmol/L   TCO2 29 0 - 100 mmol/L   Hemoglobin 13.6 12.0 - 15.0 g/dL   HCT 40.0 36.0 - 46.0 %    Ct Abdomen Pelvis W Contrast  05/17/2015   CLINICAL DATA:  Severe abdominal pain.  Leukocytosis.  EXAM: CT ABDOMEN AND PELVIS WITH CONTRAST  TECHNIQUE: Multidetector CT imaging of the abdomen and pelvis was performed using the standard protocol following bolus administration of intravenous contrast.  CONTRAST:  100 cc Omnipaque 300 intravenous  COMPARISON:  06/24/2011  FINDINGS: BODY WALL: No contributory findings.  LOWER CHEST: No contributory findings.  ABDOMEN/PELVIS:  Liver: Benign sub cm low-density in the left liver on image 15, essentially stable from 2012.  Biliary: No evidence of biliary obstruction or stone.  Pancreas: Duct anatomy consistent with divisum. There is chronic prominence of the main and accessory pancreatic ducts measuring up to 3 mm. No active inflammatory change.  Spleen: Unremarkable.  Adrenals: Unremarkable.  Kidneys and ureters: No hydronephrosis or stone. Stable benign cyst in the right kidney.  Bladder: Unremarkable.  Reproductive: Hysterectomy and probable oophorectomies.  Bowel: Dilated fluid-filled bowel with small reactive ascites and mesenteric congestion, leading to a focal transition point in the pelvis on image 62. No pneumoperitoneum or pneumatosis. This is most likely stricture or adhesion. No active inflammatory enteritis is noted in this patient with history of ulcerative colitis. No appendicitis.  Retroperitoneum: No mass or adenopathy.  Vascular: No acute abnormality.  OSSEOUS: No acute abnormalities. Lower lumbar facet arthropathy. No sacroiliitis.  IMPRESSION: 1. Small bowel obstruction from stricture or adhesion in the pelvis. This is the same site of obstruction as in 2012. 2. Pancreas divisum.   Electronically Signed   By:  Monte Fantasia M.D.   On: 05/17/2015 05:42   Dg Abd Acute W/chest  05/17/2015   CLINICAL DATA:  Abdominal pain.  History of ulcerative colitis.  EXAM: DG ABDOMEN ACUTE W/ 1V CHEST  COMPARISON:  06/25/2011  FINDINGS: Bubbles of gas within in the left abdomen are linearly arranged. This could reflect an enteritis or partial obstruction. No evidence of bowel distention. No high-grade bowel obstruction is suspected as there is no decompression of colon. No evidence of bowel perforation. No concerning intra-abdominal mass effect or calcification.  Normal heart size and mediastinal contours. No acute infiltrate or edema. No effusion or pneumothorax. No acute osseous findings.  IMPRESSION: Fluid-filled proximal small bowel as can be seen with enteritis or partial obstruction.   Electronically Signed   By: Monte Fantasia M.D.   On: 05/17/2015 03:34    Review of Systems  Constitutional: Positive for malaise/fatigue. Negative for fever, chills, weight loss and diaphoresis.  Eyes: Negative for blurred vision, double vision, photophobia, pain, discharge and redness.  Respiratory: Positive for shortness of breath. Negative for cough, hemoptysis, sputum production and wheezing.   Cardiovascular: Positive for leg swelling. Negative for chest pain, palpitations, orthopnea, claudication and PND.  Gastrointestinal: Positive for heartburn, nausea, vomiting and abdominal pain. Negative for diarrhea, constipation, blood in stool and melena.  Genitourinary: Negative for dysuria, urgency, frequency, hematuria and flank pain.  Musculoskeletal: Negative for myalgias, back pain, joint pain, falls and neck pain.  Neurological: Positive for headaches. Negative for dizziness, tingling, tremors, sensory change, speech change, focal weakness, seizures, loss of consciousness and weakness.  Psychiatric/Behavioral: Positive for memory loss. Negative for depression. The patient is not nervous/anxious and does not have insomnia.     Blood pressure 118/104, pulse 68, temperature 98.2 F (  36.8 C), temperature source Oral, resp. rate 18, SpO2 99 %. Physical Exam  Constitutional: She is oriented to person, place, and time. She appears well-developed and well-nourished. No distress.  Eyes: Right eye exhibits no discharge. Left eye exhibits no discharge. No scleral icterus.  Neck: Normal range of motion. Neck supple.  Cardiovascular: Normal rate, regular rhythm, normal heart sounds and intact distal pulses.  Exam reveals no gallop and no friction rub.   No murmur heard. Respiratory: Effort normal and breath sounds normal. No respiratory distress. She has no wheezes. She has no rales. She exhibits no tenderness.  GI: Soft. Bowel sounds are normal. She exhibits no distension and no mass. There is no rebound and no guarding.  Mild tenderness to palpation  Musculoskeletal: Normal range of motion. She exhibits no tenderness.  Trace BLE non pitting edema.   Lymphadenopathy:    She has no cervical adenopathy.  Neurological: She is alert and oriented to person, place, and time.  Skin: Skin is warm and dry. No rash noted. She is not diaphoretic. No erythema. No pallor.  Psychiatric: She has a normal mood and affect. Her behavior is normal. Judgment and thought content normal.    Assessment/Plan: Ulcerative colitis Small bowel obstruction -no acute abdomen, but pt is on steroids and therefore her abdominal exam is unreliable.  She will likely need surgery this admission, but Dr. Marcello Moores will be here over the weekend and will make further recommendations as the patient was just seen by Dr. Marcello Moores and offered surgery, but family and patient had not reached a decision. -place NGT to LIWS, bowel rest, IV hydration, pain control and anti-emetics, AXR and labs in AM. -she's been on prednisone 38m daily, d/w medicine, 227mdaily.   Paxten Appelt ANP-BC 05/17/2015, 8:54 AM

## 2015-05-18 ENCOUNTER — Inpatient Hospital Stay (HOSPITAL_COMMUNITY): Payer: 59

## 2015-05-18 DIAGNOSIS — I10 Essential (primary) hypertension: Secondary | ICD-10-CM

## 2015-05-18 LAB — COMPREHENSIVE METABOLIC PANEL
ALT: 18 U/L (ref 14–54)
AST: 15 U/L (ref 15–41)
Albumin: 3.5 g/dL (ref 3.5–5.0)
Alkaline Phosphatase: 60 U/L (ref 38–126)
Anion gap: 8 (ref 5–15)
BUN: 16 mg/dL (ref 6–20)
CALCIUM: 8.8 mg/dL — AB (ref 8.9–10.3)
CO2: 26 mmol/L (ref 22–32)
Chloride: 100 mmol/L — ABNORMAL LOW (ref 101–111)
Creatinine, Ser: 0.87 mg/dL (ref 0.44–1.00)
GFR calc Af Amer: >60 mL/min (ref >60)
Glucose, Bld: 96 mg/dL (ref 65–99)
Potassium: 3.6 mmol/L (ref 3.5–5.1)
SODIUM: 134 mmol/L — AB (ref 135–145)
TOTAL PROTEIN: 6.9 g/dL (ref 6.5–8.1)
Total Bilirubin: 0.6 mg/dL (ref 0.3–1.2)

## 2015-05-18 LAB — CBC
HCT: 34.9 % — ABNORMAL LOW (ref 36.0–46.0)
Hemoglobin: 11.4 g/dL — ABNORMAL LOW (ref 12.0–15.0)
MCH: 29.5 pg (ref 26.0–34.0)
MCHC: 32.7 g/dL (ref 30.0–36.0)
MCV: 90.2 fL (ref 78.0–100.0)
Platelets: 316 10*3/uL (ref 150–400)
RBC: 3.87 MIL/uL (ref 3.87–5.11)
RDW: 13.5 % (ref 11.5–15.5)
WBC: 18.2 10*3/uL — ABNORMAL HIGH (ref 4.0–10.5)

## 2015-05-18 LAB — GLUCOSE, CAPILLARY: Glucose-Capillary: 77 mg/dL (ref 65–99)

## 2015-05-18 MED ORDER — ACETAMINOPHEN 325 MG PO TABS
325.0000 mg | ORAL_TABLET | Freq: Four times a day (QID) | ORAL | Status: DC | PRN
  Administered 2015-05-18: 325 mg via ORAL
  Administered 2015-05-19 (×2): 650 mg via ORAL
  Filled 2015-05-18: qty 2
  Filled 2015-05-18: qty 1
  Filled 2015-05-18: qty 2

## 2015-05-18 MED ORDER — MENTHOL 3 MG MT LOZG
1.0000 | LOZENGE | OROMUCOSAL | Status: DC | PRN
  Administered 2015-05-18: 3 mg via ORAL
  Filled 2015-05-18: qty 9

## 2015-05-18 NOTE — Progress Notes (Addendum)
Patient ID: Latasha Wood, female   DOB: April 18, 1956, 59 y.o.   MRN: [id number] [facility] PROGRESS NOTE  ANTONIO WOODHAMS UTM:[id number] DOB: 1956/03/18 DOA: 05/17/2015 PCP: Scarlette Calico, MD  Brief narrative:    59 year old female with a past medical history of ulcerative colitis (follows with Dr. [name]), was on Humira (recently switched to new medication unsure of exact name), she is on daily prednisone 30 mg, hypertension who presented to [facility] with worsening intermittent epigastric  abdominal pain over past couple of days prior to this admission, cramp like, associated with nausea and nonbloody vomiting.   In ED, patient was hemodynamically stable. Her white blood cell count was 23.1 otherwise unremarkable. X-ray of the abdomen and CT of the abdomen demonstrated small bowel obstruction from stricture or adhesion in the pelvis which is the same site of obstruction as on imaging studies in 2012. Surgery has seen the patient in consultation. NG tube placed 05/17/2015.   Assessment/Plan:     Principal problem: Abdominal pain / small bowel obstruction / Nausea and vomiting  - SBO seen on plain film and CT abdomen likely from adhesions - Per surgery conservative management with NG tube to suction - NPO and IV fluids for hydration - Will follow up on surgery recommendations for today - pt still with some NG output but she has no N/V. - Placed on empiric cipro and flagyl due to concern for intra-abdominal infection and leukocytosis. Will follow up on surgery recommendations on how long pt needs to be on those 2 meds   Active Problems: Leukocytosis - Infection versus prednisone - It is trending down - She is on empiric cipro and flagyl  Essential hypertension - Benicar on hold because patient is nothing by mouth. - BP controlled with hydralazine 5 mg every 12 hours IV.   Ulcerative colitis with intestinal obstruction  - Continue Solu-Medrol 20 mg IV daily  until she can tolerate PO intake     DVT Prophylaxis  - Lovenox subQ ordered    Code Status: Full.  Family Communication:  plan of care discussed with the patient and her husband at the bedside  Disposition Plan: Home once SBO resolves.  IV access:  Peripheral IV  Procedures and diagnostic studies:    Ct Abdomen Pelvis W Contrast 05/17/2015  1. Small bowel obstruction from stricture or adhesion in the pelvis. This is the same site of obstruction as in 2012. 2. Pancreas divisum.   Electronically Signed   By: Monte Fantasia M.D.   On: 05/17/2015 05:42   Dg Abd 2 Views 05/18/2015  No evidence of small bowel obstruction or free air.  Enteric tube terminates in the gastric cardia.     Dg Abd Acute W/chest 05/17/2015   Normal heart size and mediastinal contours. No acute infiltrate or edema. No effusion or pneumothorax. No acute osseous findings.  IMPRESSION: Fluid-filled proximal small bowel as can be seen with enteritis or partial obstruction.     Medical Consultants:  Surgery, Dr. Leighton Ruff   Other Consultants:  None   IAnti-Infectives:   Cipro 7/8//2016 --> Flagyl 05/17/2015 -->    Leisa Lenz, MD  [facility] Pager [phone]  Time spent in minutes: 25 minutes  If 7PM-7AM, please contact night-coverage [email or web address] Password [email or web address] 05/18/2015, 9:36 AM   LOS: 1 day    HPI/Subjective: No acute overnight events. Patient reports feeling better this am, no vomiting.   Objective: Filed Vitals:   05/17/15 0830 05/17/15 1525 05/17/15  2120 05/18/15 0557  BP: 118/104 142/85 135/80 138/79  Pulse: 68 67 67 73  Temp: 97.7 F (36.5 C)  98.6 F (37 C) 98.2 F (36.8 C)  TempSrc:   Oral Oral  Resp: 18 16 15 16   Height: 5' 3"  (1.6 m)     Weight: 86.183 kg (190 lb)     SpO2: 99% 93% 92% 97%    Intake/Output Summary (Last 24 hours) at 05/18/15 0936 Last data filed at 05/18/15 0746  Gross per 24 hour  Intake 1913.75 ml  Output   1375 ml  Net 538.75 ml     Exam:   General:  Pt is alert, follows commands appropriately, not in acute distress  Cardiovascular: Regular rate and rhythm, S1/S2, no murmurs  Respiratory: Clear to auscultation bilaterally, no wheezing, no crackles, no rhonchi  Abdomen: Soft, non tender, non distended, bowel sounds slugish  Extremities: No edema, pulses DP and PT palpable bilaterally  Neuro: Grossly nonfocal  Data Reviewed: Basic Metabolic Panel:  Recent Labs Lab 05/17/15 0312 05/18/15 0524  NA 139 134*  K 3.5 3.6  CL 102 100*  CO2  --  26  GLUCOSE 132* 96  BUN 19 16  CREATININE 1.00 0.87  CALCIUM  --  8.8*   Liver Function Tests:  Recent Labs Lab 05/18/15 0524  AST 15  ALT 18  ALKPHOS 60  BILITOT 0.6  PROT 6.9  ALBUMIN 3.5   No results for input(s): LIPASE, AMYLASE in the last 168 hours. No results for input(s): AMMONIA in the last 168 hours. CBC:  Recent Labs Lab 05/17/15 0303 05/17/15 0312 05/18/15 0524  WBC 23.1*  --  18.2*  NEUTROABS 18.9*  --   --   HGB 12.7 13.6 11.4*  HCT 37.2 40.0 34.9*  MCV 87.3  --  90.2  PLT 338  --  316   Cardiac Enzymes: No results for input(s): CKTOTAL, CKMB, CKMBINDEX, TROPONINI in the last 168 hours. BNP: Invalid input(s): POCBNP CBG:  Recent Labs Lab 05/18/15 0745  GLUCAP 77    No results found for this or any previous visit (from the past 240 hour(s)).   Scheduled Meds: . ciprofloxacin  400 mg Intravenous Q12H  . enoxaparin (LOVENOX) injection  40 mg Subcutaneous Daily  . hydrALAZINE  5 mg Intravenous Q12H  . methylPREDNISolone (SOLU-MEDROL) injection  20 mg Intravenous Daily  . metronidazole  500 mg Intravenous Q8H   Continuous Infusions: . sodium chloride 75 mL/hr at 05/18/15 0600

## 2015-05-18 NOTE — Progress Notes (Addendum)
SBO (small bowel obstruction)  Subjective: Pt feeling better.  NG in place.  No nausea.  She is having some SOB and dizziness that has worsened from last night  Objective: Vital signs in last 24 hours: Temp:  [98.2 F (36.8 C)-98.6 F (37 C)] 98.2 F (36.8 C) (07/09 0557) Pulse Rate:  [67-73] 73 (07/09 0557) Resp:  [15-16] 16 (07/09 0557) BP: (135-142)/(79-85) 138/79 mmHg (07/09 0557) SpO2:  [92 %-97 %] 97 % (07/09 0557) Last BM Date: 05/16/15  Intake/Output from previous day: 07/08 0701 - 07/09 0700 In: 1913.8 [I.V.:1413.8; IV Piggyback:500] Out: 1375 [Urine:1200; Emesis/NG output:175] Intake/Output this shift:    General appearance: alert and cooperative GI: soft, non-tender; bowel sounds normal; no masses NG: clear output   Lab Results:  Results for orders placed or performed during the hospital encounter of 05/17/15 (from the past 24 hour(s))  TSH     Status: None   Collection Time: 05/17/15  9:09 AM  Result Value Ref Range   TSH 1.123 0.350 - 4.500 uIU/mL  Comprehensive metabolic panel     Status: Abnormal   Collection Time: 05/18/15  5:24 AM  Result Value Ref Range   Sodium 134 (L) 135 - 145 mmol/L   Potassium 3.6 3.5 - 5.1 mmol/L   Chloride 100 (L) 101 - 111 mmol/L   CO2 26 22 - 32 mmol/L   Glucose, Bld 96 65 - 99 mg/dL   BUN 16 6 - 20 mg/dL   Creatinine, Ser 0.87 0.44 - 1.00 mg/dL   Calcium 8.8 (L) 8.9 - 10.3 mg/dL   Total Protein 6.9 6.5 - 8.1 g/dL   Albumin 3.5 3.5 - 5.0 g/dL   AST 15 15 - 41 U/L   ALT 18 14 - 54 U/L   Alkaline Phosphatase 60 38 - 126 U/L   Total Bilirubin 0.6 0.3 - 1.2 mg/dL   GFR calc non Af Amer >60 >60 mL/min   GFR calc Af Amer >60 >60 mL/min   Anion gap 8 5 - 15  CBC     Status: Abnormal   Collection Time: 05/18/15  5:24 AM  Result Value Ref Range   WBC 18.2 (H) 4.0 - 10.5 K/uL   RBC 3.87 3.87 - 5.11 MIL/uL   Hemoglobin 11.4 (L) 12.0 - 15.0 g/dL   HCT 34.9 (L) 36.0 - 46.0 %   MCV 90.2 78.0 - 100.0 fL   MCH 29.5 26.0 - 34.0  pg   MCHC 32.7 30.0 - 36.0 g/dL   RDW 13.5 11.5 - 15.5 %   Platelets 316 150 - 400 K/uL  Glucose, capillary     Status: None   Collection Time: 05/18/15  7:45 AM  Result Value Ref Range   Glucose-Capillary 77 65 - 99 mg/dL     Studies/Results Radiology     MEDS, Scheduled . ciprofloxacin  400 mg Intravenous Q12H  . enoxaparin (LOVENOX) injection  40 mg Subcutaneous Daily  . hydrALAZINE  5 mg Intravenous Q12H  . methylPREDNISolone (SOLU-MEDROL) injection  20 mg Intravenous Daily  . metronidazole  500 mg Intravenous Q8H     Assessment: SBO (small bowel obstruction)   Plan: Plain films show resolution of SBO, clinical exam concurrent with this.  Will clamp NG for 6 h.  If residuals still low, will d/c NG and start clears.  I doubt obstruction is due to UC flare.  Can probably come down off steroids quickly but will defer this to GI.  CT and clinical picture seems  more consistent with adhesions causing obstruction.    Ordered Tylenol for headache.  Ordered EKG for SOB and dizziness.  Vitals stable.  Will defer any other workup to medical team.      LOS: 1 day    Rosario Adie, [facility], [place]  Addendum: EKG reviewed, NSR no ST changes   05/18/2015 8:32 AM

## 2015-05-19 LAB — CBC
HEMATOCRIT: 34.9 % — AB (ref 36.0–46.0)
Hemoglobin: 11.5 g/dL — ABNORMAL LOW (ref 12.0–15.0)
MCH: 29.5 pg (ref 26.0–34.0)
MCHC: 33 g/dL (ref 30.0–36.0)
MCV: 89.5 fL (ref 78.0–100.0)
Platelets: 303 10*3/uL (ref 150–400)
RBC: 3.9 MIL/uL (ref 3.87–5.11)
RDW: 13.4 % (ref 11.5–15.5)
WBC: 10.3 10*3/uL (ref 4.0–10.5)

## 2015-05-19 LAB — GLUCOSE, CAPILLARY
Glucose-Capillary: 66 mg/dL (ref 65–99)
Glucose-Capillary: 95 mg/dL (ref 65–99)

## 2015-05-19 MED ORDER — ONDANSETRON HCL 4 MG PO TABS: 4.0000 mg | ORAL_TABLET | Freq: Four times a day (QID) | ORAL | Status: DC | PRN

## 2015-05-19 NOTE — Discharge Summary (Signed)
Physician Discharge Summary  Latasha Wood DGU:[id number] DOB: 07-05-1956 DOA: 05/17/2015  PCP: Scarlette Calico, MD  Admit date: 05/17/2015 Discharge date: 05/19/2015  Recommendations for Outpatient Follow-up:  1. No changes in medications on discharge   Discharge Diagnoses:  Principal Problem:   SBO (small bowel obstruction) Active Problems:   Abdominal pain   Essential hypertension   Ulcerative colitis with intestinal obstruction   Leukocytosis    Discharge Condition: stable   Diet recommendation: as tolerated   History of present illness:   59 year old female with a past medical history of ulcerative colitis (follows with Dr. [name]), was on Humira (recently switched to new medication unsure of exact name), she is on daily prednisone 30 mg, hypertension who presented to [facility] long hospital with worsening intermittent epigastric abdominal pain over past couple of days prior to this admission, cramp like, associated with nausea and nonbloody vomiting.   In ED, patient was hemodynamically stable. Her white blood cell count was 23.1 otherwise unremarkable. X-ray of the abdomen and CT of the abdomen demonstrated small bowel obstruction from stricture or adhesion in the pelvis which is the same site of obstruction as on imaging studies in 2012. Surgery has seen the patient in consultation. NG tube placed 05/17/2015.   Hospital Course:    Assessment/Plan:     Principal problem: Abdominal pain / small bowel obstruction / Nausea and vomiting  - SBO seen on plain film and CT abdomen likely from adhesions - Pt was treated conservatively per surgery with NG tube to suction - She tolerated regular diet this am - Stable for discharge per surgery - No need for abx on discharge   Active Problems: Leukocytosis - Likely prednisone - No evidence of infection - Stop cipro and flagyl prior to discharge   Essential hypertension - Benicar on hold because patient was NPO -  Resume on discharge   Ulcerative colitis with intestinal obstruction  - Continue prednisone per home regimen     DVT Prophylaxis  - Lovenox subQ ordered    Code Status: Full.  Family Communication: plan of care discussed with the patient and her husband at the bedside    IV access:  Peripheral IV  Procedures and diagnostic studies:   Ct Abdomen Pelvis W Contrast 05/17/2015 1. Small bowel obstruction from stricture or adhesion in the pelvis. This is the same site of obstruction as in 2012. 2. Pancreas divisum. Electronically Signed By: Monte Fantasia M.D. On: 05/17/2015 05:42   Dg Abd 2 Views 05/18/2015 No evidence of small bowel obstruction or free air. Enteric tube terminates in the gastric cardia.   Dg Abd Acute W/chest 05/17/2015 Normal heart size and mediastinal contours. No acute infiltrate or edema. No effusion or pneumothorax. No acute osseous findings. IMPRESSION: Fluid-filled proximal small bowel as can be seen with enteritis or partial obstruction.   Medical Consultants:  Surgery, Dr. Leighton Ruff   Other Consultants:  None   IAnti-Infectives:   Cipro 7/8//2016 --> 05/19/2015 Flagyl 05/17/2015 -->  05/19/2015   Signed:  Leisa Lenz, MD  [facility] 05/19/2015, 2:01 PM  Pager #: [phone]  Time spent in minutes: more than 30 minutes  Discharge Exam: Filed Vitals:   05/19/15 0200  BP: 135/77  Pulse: 73  Temp: 98.7 F (37.1 C)  Resp: 16   Filed Vitals:   05/18/15 1056 05/18/15 1400 05/18/15 2203 05/19/15 0200  BP: 145/80 152/77 145/84 135/77  Pulse:  78  73  Temp:  98.8 F (37.1 C)  98.7 F (37.1 C)  TempSrc:  Oral  Oral  Resp:  18  16  Height:      Weight:      SpO2:  99%  99%    General: Pt is alert, follows commands appropriately, not in acute distress Cardiovascular: Regular rate and rhythm, S1/S2 +, no murmurs Respiratory: Clear to auscultation bilaterally, no wheezing, no crackles, no  rhonchi Abdominal: Soft, non tender, non distended, bowel sounds +, no guarding Extremities: no edema, no cyanosis, pulses palpable bilaterally DP and PT Neuro: Grossly nonfocal  Discharge Instructions  Discharge Instructions    Call MD for:  difficulty breathing, headache or visual disturbances    Complete by:  As directed      Call MD for:  persistant nausea and vomiting    Complete by:  As directed      Call MD for:  severe uncontrolled pain    Complete by:  As directed      Diet - low sodium heart healthy    Complete by:  As directed      Discharge instructions    Complete by:  As directed   1. Continue prednisone per GI recommendations     Increase activity slowly    Complete by:  As directed             Medication List    STOP taking these medications        Adalimumab 40 MG/0.8ML Pnkt  Commonly known as:  HUMIRA PEN     cetirizine 10 MG tablet  Commonly known as:  ZYRTEC     hydrocortisone 100 MG/60ML enema  Commonly known as:  CORTENEMA     mercaptopurine 50 MG tablet  Commonly known as:  PURINETHOL      TAKE these medications        ALPRAZolam 0.25 MG tablet  Commonly known as:  XANAX  Take 1 tablet (0.25 mg total) by mouth 2 (two) times daily as needed for anxiety.     BENICAR HCT 40-12.5 MG per tablet  Generic drug:  olmesartan-hydrochlorothiazide  Take 1 tablet by mouth  daily     calcium-vitamin D 500-200 MG-UNIT per tablet  Commonly known as:  OSCAL WITH D  Take 1 tablet by mouth daily.     glycopyrrolate 2 MG tablet  Commonly known as:  ROBINUL  Take 1 tablet (2 mg total) by mouth 2 (two) times daily.     mesalamine 1.2 G EC tablet  Commonly known as:  LIALDA  Take 2 tablets by mouth every morning and 2 tablets by mouth every evening.     ondansetron 4 MG tablet  Commonly known as:  ZOFRAN  Take 1 tablet (4 mg total) by mouth every 6 (six) hours as needed for nausea or vomiting.     predniSONE 10 MG tablet  Commonly known as:   DELTASONE  Take 52m every AM     traMADol 50 MG tablet  Commonly known as:  ULTRAM  Take 1/2-1 tablet by mouth every 6 hours as needed           Follow-up Information    Follow up with TScarlette Calico MD. Schedule an appointment as soon as possible for a visit in 1 week.   Specialty:  Internal Medicine   Why:  Follow up appt after recent hospitalization   Contact information:   [address]LO[address] [place] [phone]       The results  of significant diagnostics from this hospitalization (including imaging, microbiology, ancillary and laboratory) are listed below for reference.    Significant Diagnostic Studies: Ct Abdomen Pelvis W Contrast  05/17/2015   CLINICAL DATA:  Severe abdominal pain.  Leukocytosis.  EXAM: CT ABDOMEN AND PELVIS WITH CONTRAST  TECHNIQUE: Multidetector CT imaging of the abdomen and pelvis was performed using the standard protocol following bolus administration of intravenous contrast.  CONTRAST:  100 cc Omnipaque 300 intravenous  COMPARISON:  06/24/2011  FINDINGS: BODY WALL: No contributory findings.  LOWER CHEST: No contributory findings.  ABDOMEN/PELVIS:  Liver: Benign sub cm low-density in the left liver on image 15, essentially stable from 2012.  Biliary: No evidence of biliary obstruction or stone.  Pancreas: Duct anatomy consistent with divisum. There is chronic prominence of the main and accessory pancreatic ducts measuring up to 3 mm. No active inflammatory change.  Spleen: Unremarkable.  Adrenals: Unremarkable.  Kidneys and ureters: No hydronephrosis or stone. Stable benign cyst in the right kidney.  Bladder: Unremarkable.  Reproductive: Hysterectomy and probable oophorectomies.  Bowel: Dilated fluid-filled bowel with small reactive ascites and mesenteric congestion, leading to a focal transition point in the pelvis on image 62. No pneumoperitoneum or pneumatosis. This is most likely stricture or adhesion. No active inflammatory  enteritis is noted in this patient with history of ulcerative colitis. No appendicitis.  Retroperitoneum: No mass or adenopathy.  Vascular: No acute abnormality.  OSSEOUS: No acute abnormalities. Lower lumbar facet arthropathy. No sacroiliitis.  IMPRESSION: 1. Small bowel obstruction from stricture or adhesion in the pelvis. This is the same site of obstruction as in 2012. 2. Pancreas divisum.   Electronically Signed   By: Monte Fantasia M.D.   On: 05/17/2015 05:42   Dg Abd 2 Views  05/18/2015   CLINICAL DATA:  Abdominal pain, weakness, NG tube  EXAM: ABDOMEN - 2 VIEW  COMPARISON:  CT abdomen pelvis dated 05/17/2015  FINDINGS: Nonobstructive bowel gas pattern.  Contrast in the distal small bowel and right colon.  No evidence of free air under the diaphragm on the upright view.  Enteric tube terminates in the gastric cardia.  Visualized osseous structures are within normal limits.  IMPRESSION: No evidence of small bowel obstruction or free air.  Enteric tube terminates in the gastric cardia.   Electronically Signed   By: Julian Hy M.D.   On: 05/18/2015 09:08   Dg Abd Acute W/chest  05/17/2015   CLINICAL DATA:  Abdominal pain.  History of ulcerative colitis.  EXAM: DG ABDOMEN ACUTE W/ 1V CHEST  COMPARISON:  06/25/2011  FINDINGS: Bubbles of gas within in the left abdomen are linearly arranged. This could reflect an enteritis or partial obstruction. No evidence of bowel distention. No high-grade bowel obstruction is suspected as there is no decompression of colon. No evidence of bowel perforation. No concerning intra-abdominal mass effect or calcification.  Normal heart size and mediastinal contours. No acute infiltrate or edema. No effusion or pneumothorax. No acute osseous findings.  IMPRESSION: Fluid-filled proximal small bowel as can be seen with enteritis or partial obstruction.   Electronically Signed   By: Monte Fantasia M.D.   On: 05/17/2015 03:34    Microbiology: No results found for this or  any previous visit (from the past 240 hour(s)).   Labs: Basic Metabolic Panel:  Recent Labs Lab 05/17/15 0312 05/18/15 0524  NA 139 134*  K 3.5 3.6  CL 102 100*  CO2  --  26  GLUCOSE 132* 96  BUN 19  16  CREATININE 1.00 0.87  CALCIUM  --  8.8*   Liver Function Tests:  Recent Labs Lab 05/18/15 0524  AST 15  ALT 18  ALKPHOS 60  BILITOT 0.6  PROT 6.9  ALBUMIN 3.5   No results for input(s): LIPASE, AMYLASE in the last 168 hours. No results for input(s): AMMONIA in the last 168 hours. CBC:  Recent Labs Lab 05/17/15 0303 05/17/15 0312 05/18/15 0524 05/19/15 0912  WBC 23.1*  --  18.2* 10.3  NEUTROABS 18.9*  --   --   --   HGB 12.7 13.6 11.4* 11.5*  HCT 37.2 40.0 34.9* 34.9*  MCV 87.3  --  90.2 89.5  PLT 338  --  316 303   Cardiac Enzymes: No results for input(s): CKTOTAL, CKMB, CKMBINDEX, TROPONINI in the last 168 hours. BNP: BNP (last 3 results) No results for input(s): BNP in the last 8760 hours.  ProBNP (last 3 results) No results for input(s): PROBNP in the last 8760 hours.  CBG:  Recent Labs Lab 05/18/15 0745 05/19/15 0810 05/19/15 0932  GLUCAP 77 66 95

## 2015-05-19 NOTE — Discharge Summary (Signed)
Reviewed d/c instructions with pt and S.O. including medications, precautions, and follow up appointment.  Pt/S.O. verbalized understanding of all topics discussed.  Pt with mild temp; notified Dr. Charlies Silvers and gave pt Tylenol.  Advised pt to monitor at home and advise MD if temp increases.  Pt being d/c to home into care of S.O.

## 2015-05-19 NOTE — Progress Notes (Signed)
SBO (small bowel obstruction)  Subjective: Pt feeling better.  Passing flatus and had a small BM last night.  Tolerated liquids  Objective: Vital signs in last 24 hours: Temp:  [98.7 F (37.1 C)-98.8 F (37.1 C)] 98.7 F (37.1 C) (07/10 0200) Pulse Rate:  [73-78] 73 (07/10 0200) Resp:  [16-18] 16 (07/10 0200) BP: (135-152)/(77-84) 135/77 mmHg (07/10 0200) SpO2:  [99 %] 99 % (07/10 0200) Last BM Date: 05/16/15  Intake/Output from previous day: 07/09 0701 - 07/10 0700 In: 2540 [P.O.:540; I.V.:1200; IV Piggyback:800] Out: 3000 [Urine:3000] Intake/Output this shift:    General appearance: alert and cooperative GI: soft, non-tender; non-distended, no masses    Lab Results:  Results for orders placed or performed during the hospital encounter of 05/17/15 (from the past 24 hour(s))  Glucose, capillary     Status: None   Collection Time: 05/19/15  8:10 AM  Result Value Ref Range   Glucose-Capillary 66 65 - 99 mg/dL     Studies/Results Radiology     MEDS, Scheduled . ciprofloxacin  400 mg Intravenous Q12H  . enoxaparin (LOVENOX) injection  40 mg Subcutaneous Daily  . hydrALAZINE  5 mg Intravenous Q12H  . methylPREDNISolone (SOLU-MEDROL) injection  20 mg Intravenous Daily  . metronidazole  500 mg Intravenous Q8H     Assessment: SBO (small bowel obstruction)   Plan: SBO: Pt with a small BM last night.  Will advance to soft diet.  Saline lock IV.  I doubt obstruction is due to UC flare.  Can probably come down off steroids quickly but will defer this to GI.  CT and clinical picture seems more consistent with adhesions causing obstruction.    Leukocytosis: trending down.  Will repeat CBC this am.  If wbc still trending down and she tolerates a soft diet, would probably be ok for d/c today.  Antibiotics per medical team, as I am not sure what they are treating at this point      LOS: 2 days    Rosario Adie, MD [facility],  [place]    05/19/2015 8:47 AM

## 2015-05-19 NOTE — Discharge Instructions (Signed)
Small Bowel Obstruction °A small bowel obstruction is a blockage (obstruction) of the small intestine (small bowel). The small bowel is a long, slender tube that connects the stomach to the colon. Its job is to absorb nutrients from the fluids and foods you consume into the bloodstream.  °CAUSES  °There are many causes of intestinal blockage. The most common ones include: °· Hernias. This is a more common cause in children than adults. °· Inflammatory bowel disease (enteritis and colitis). °· Twisting of the bowel (volvulus). °· Tumors. °· Scar tissue (adhesions) from previous surgery or radiation treatment. °· Recent surgery. This may cause an acute small bowel obstruction called an ileus. °SYMPTOMS  °· Abdominal pain. This may be dull cramps or sharp pain. It may occur in one area or may be present in the entire abdomen. Pain can range from mild to severe, depending on the degree of obstruction. °· Nausea and vomiting. Vomit may be greenish or yellow bile color. °· Distended or swollen stomach. Abdominal bloating is a common symptom. °· Constipation. °· Lack of passing gas. °· Frequent belching. °· Diarrhea. This may occur if runny stool is able to leak around the obstruction. °DIAGNOSIS  °Your caregiver can usually diagnose small bowel obstruction by taking a history, doing a physical exam, and taking X-rays. If the cause is unclear, a CT scan (computerized tomography) of your abdomen and pelvis may be needed. °TREATMENT  °Treatment of the blockage depends on the cause and how bad the problem is.  °· Sometimes, the obstruction improves with bed rest and intravenous (IV) fluids. °· Resting the bowel is very important. This means following a simple diet. Sometimes, a clear liquid diet may be required for several days. °· Sometimes, a small tube (nasogastric tube) is placed into the stomach to decompress the bowel. When the bowel is blocked, it usually swells up like a balloon filled with air and fluids.  Decompression means that the air and fluids are removed by suction through that tube. This can help with pain, discomfort, and nausea. It can also help the obstruction resolve faster. °· Surgery may be required if other treatments do not work. Bowel obstruction from a hernia may require early surgery and can be an emergency procedure. Adhesions that cause frequent or severe obstructions may also require surgery. °HOME CARE INSTRUCTIONS °If your bowel obstruction is only partial or incomplete, you may be allowed to go home. °· Get plenty of rest. °· Follow your diet as directed by your caregiver. °· Only consume clear liquids until your condition improves. °· Avoid solid foods as instructed. °SEEK IMMEDIATE MEDICAL CARE IF: °· You have increased pain or cramping. °· You vomit blood. °· You have uncontrolled vomiting or nausea. °· You cannot drink fluids due to vomiting or pain. °· You develop confusion. °· You begin feeling very dry or thirsty (dehydrated). °· You have severe bloating. °· You have chills. °· You have a fever. °· You feel extremely weak or you faint. °MAKE SURE YOU: °· Understand these instructions. °· Will watch your condition. °· Will get help right away if you are not doing well or get worse. °Document Released: 01/12/2006 Document Revised: 01/18/2012 Document Reviewed: 01/09/2011 °[employer]® Patient Information ©2015 [employer]. This information is not intended to replace advice given to you by your health care provider. Make sure you discuss any questions you have with your health care provider. ° °

## 2015-05-24 ENCOUNTER — Encounter: Payer: Self-pay | Admitting: Internal Medicine

## 2015-05-27 ENCOUNTER — Telehealth: Payer: Self-pay | Admitting: Internal Medicine

## 2015-05-27 NOTE — Telephone Encounter (Signed)
Patient has received her Simponi and is asking when she can start this. Please, advise.

## 2015-05-27 NOTE — Telephone Encounter (Signed)
She should check with Dr Marcello Moores who is planning total colectomy in the fall. If the surgery not planned in next few months then she ought to start Symponi ASAP with induction regimen.see me after completing the induction.

## 2015-05-28 ENCOUNTER — Encounter: Payer: Self-pay | Admitting: Internal Medicine

## 2015-05-28 ENCOUNTER — Ambulatory Visit (INDEPENDENT_AMBULATORY_CARE_PROVIDER_SITE_OTHER): Payer: 59 | Admitting: Internal Medicine

## 2015-05-28 ENCOUNTER — Other Ambulatory Visit (INDEPENDENT_AMBULATORY_CARE_PROVIDER_SITE_OTHER): Payer: 59

## 2015-05-28 VITALS — BP 140/92 | HR 85 | Temp 98.8°F | Resp 16 | Ht 63.0 in | Wt 197.5 lb

## 2015-05-28 DIAGNOSIS — I10 Essential (primary) hypertension: Secondary | ICD-10-CM

## 2015-05-28 DIAGNOSIS — D510 Vitamin B12 deficiency anemia due to intrinsic factor deficiency: Secondary | ICD-10-CM | POA: Insufficient documentation

## 2015-05-28 DIAGNOSIS — K51912 Ulcerative colitis, unspecified with intestinal obstruction: Secondary | ICD-10-CM

## 2015-05-28 DIAGNOSIS — Z0000 Encounter for general adult medical examination without abnormal findings: Secondary | ICD-10-CM | POA: Insufficient documentation

## 2015-05-28 DIAGNOSIS — E876 Hypokalemia: Secondary | ICD-10-CM

## 2015-05-28 DIAGNOSIS — R0609 Other forms of dyspnea: Secondary | ICD-10-CM

## 2015-05-28 DIAGNOSIS — K5669 Other intestinal obstruction: Secondary | ICD-10-CM

## 2015-05-28 DIAGNOSIS — Z23 Encounter for immunization: Secondary | ICD-10-CM | POA: Diagnosis not present

## 2015-05-28 DIAGNOSIS — K56609 Unspecified intestinal obstruction, unspecified as to partial versus complete obstruction: Secondary | ICD-10-CM

## 2015-05-28 LAB — LIPID PANEL
CHOLESTEROL: 245 mg/dL — AB (ref 0–200)
HDL: 74.5 mg/dL (ref >39.00)
LDL Cholesterol: 147 mg/dL — ABNORMAL HIGH (ref 0–99)
NonHDL: 170.5
TRIGLYCERIDES: 120 mg/dL (ref 0.0–149.0)
Total CHOL/HDL Ratio: 3
VLDL: 24 mg/dL (ref 0.0–40.0)

## 2015-05-28 LAB — BASIC METABOLIC PANEL
BUN: 20 mg/dL (ref 6–23)
CHLORIDE: 103 meq/L (ref 96–112)
CO2: 28 mEq/L (ref 19–32)
Calcium: 9 mg/dL (ref 8.4–10.5)
Creatinine, Ser: 0.89 mg/dL (ref 0.40–1.20)
GFR: 83.14 mL/min (ref >60.00)
GLUCOSE: 84 mg/dL (ref 70–99)
Potassium: 3.3 mEq/L — ABNORMAL LOW (ref 3.5–5.1)
SODIUM: 139 meq/L (ref 135–145)

## 2015-05-28 LAB — CBC WITH DIFFERENTIAL/PLATELET
BASOS ABS: 0 10*3/uL (ref 0.0–0.1)
Basophils Relative: 0.3 % (ref 0.0–3.0)
Eosinophils Absolute: 0.1 10*3/uL (ref 0.0–0.7)
Eosinophils Relative: 0.8 % (ref 0.0–5.0)
HCT: 36.7 % (ref 36.0–46.0)
Hemoglobin: 12.3 g/dL (ref 12.0–15.0)
LYMPHS PCT: 29 % (ref 12.0–46.0)
Lymphs Abs: 3.9 10*3/uL (ref 0.7–4.0)
MCHC: 33.5 g/dL (ref 30.0–36.0)
MCV: 88.4 fl (ref 78.0–100.0)
Monocytes Absolute: 0.7 10*3/uL (ref 0.1–1.0)
Monocytes Relative: 5.3 % (ref 3.0–12.0)
NEUTROS PCT: 64.6 % (ref 43.0–77.0)
Neutro Abs: 8.6 10*3/uL — ABNORMAL HIGH (ref 1.4–7.7)
PLATELETS: 363 10*3/uL (ref 150.0–400.0)
RBC: 4.16 Mil/uL (ref 3.87–5.11)
RDW: 14.2 % (ref 11.5–15.5)
WBC: 13.3 10*3/uL — ABNORMAL HIGH (ref 4.0–10.5)

## 2015-05-28 LAB — IBC PANEL
IRON: 98 ug/dL (ref 42–145)
Saturation Ratios: 28.9 % (ref 20.0–50.0)
TRANSFERRIN: 242 mg/dL (ref 212.0–360.0)

## 2015-05-28 LAB — VITAMIN B12: Vitamin B-12: 518 pg/mL (ref 211–911)

## 2015-05-28 LAB — FOLATE: Folate: 20.8 ng/mL (ref >5.9)

## 2015-05-28 LAB — FERRITIN: Ferritin: 57.3 ng/mL (ref 10.0–291.0)

## 2015-05-28 MED ORDER — OLMESARTAN MEDOXOMIL-HCTZ 40-12.5 MG PO TABS: ORAL_TABLET | ORAL | Status: DC

## 2015-05-28 MED ORDER — POTASSIUM CHLORIDE CRYS ER 20 MEQ PO TBCR: 20.0000 meq | EXTENDED_RELEASE_TABLET | Freq: Every day | ORAL | Status: DC

## 2015-05-28 NOTE — Patient Instructions (Signed)
Preventive Care for Adults A healthy lifestyle and preventive care can promote health and wellness. Preventive health guidelines for women include the following key practices.  A routine yearly physical is a good way to check with your health care provider about your health and preventive screening. It is a chance to share any concerns and updates on your health and to receive a thorough exam.  Visit your dentist for a routine exam and preventive care every 6 months. Brush your teeth twice a day and floss once a day. Good oral hygiene prevents tooth decay and gum disease.  The frequency of eye exams is based on your age, health, family medical history, use of contact lenses, and other factors. Follow your health care provider's recommendations for frequency of eye exams.  Eat a healthy diet. Foods like vegetables, fruits, whole grains, low-fat dairy products, and lean protein foods contain the nutrients you need without too many calories. Decrease your intake of foods high in solid fats, added sugars, and salt. Eat the right amount of calories for you.Get information about a proper diet from your health care provider, if necessary.  Regular physical exercise is one of the most important things you can do for your health. Most adults should get at least 150 minutes of moderate-intensity exercise (any activity that increases your heart rate and causes you to sweat) each week. In addition, most adults need muscle-strengthening exercises on 2 or more days a week.  Maintain a healthy weight. The body mass index (BMI) is a screening tool to identify possible weight problems. It provides an estimate of body fat based on height and weight. Your health care provider can find your BMI and can help you achieve or maintain a healthy weight.For adults 20 years and older:  A BMI below 18.5 is considered underweight.  A BMI of 18.5 to 24.9 is normal.  A BMI of 25 to 29.9 is considered overweight.  A BMI of  30 and above is considered obese.  Maintain normal blood lipids and cholesterol levels by exercising and minimizing your intake of saturated fat. Eat a balanced diet with plenty of fruit and vegetables. Blood tests for lipids and cholesterol should begin at age 76 and be repeated every 5 years. If your lipid or cholesterol levels are high, you are over 50, or you are at high risk for heart disease, you may need your cholesterol levels checked more frequently.Ongoing high lipid and cholesterol levels should be treated with medicines if diet and exercise are not working.  If you smoke, find out from your health care provider how to quit. If you do not use tobacco, do not start.  Lung cancer screening is recommended for adults aged 22-80 years who are at high risk for developing lung cancer because of a history of smoking. A yearly low-dose CT scan of the lungs is recommended for people who have at least a 30-pack-year history of smoking and are a current smoker or have quit within the past 15 years. A pack year of smoking is smoking an average of 1 pack of cigarettes a day for 1 year (for example: 1 pack a day for 30 years or 2 packs a day for 15 years). Yearly screening should continue until the smoker has stopped smoking for at least 15 years. Yearly screening should be stopped for people who develop a health problem that would prevent them from having lung cancer treatment.  If you are pregnant, do not drink alcohol. If you are breastfeeding,  be very cautious about drinking alcohol. If you are not pregnant and choose to drink alcohol, do not have more than 1 drink per day. One drink is considered to be 12 ounces (355 mL) of beer, 5 ounces (148 mL) of wine, or 1.5 ounces (44 mL) of liquor.  Avoid use of street drugs. Do not share needles with anyone. Ask for help if you need support or instructions about stopping the use of drugs.  High blood pressure causes heart disease and increases the risk of  stroke. Your blood pressure should be checked at least every 1 to 2 years. Ongoing high blood pressure should be treated with medicines if weight loss and exercise do not work.  If you are 3-86 years old, ask your health care provider if you should take aspirin to prevent strokes.  Diabetes screening involves taking a blood sample to check your fasting blood sugar level. This should be done once every 3 years, after age 67, if you are within normal weight and without risk factors for diabetes. Testing should be considered at a younger age or be carried out more frequently if you are overweight and have at least 1 risk factor for diabetes.  Breast cancer screening is essential preventive care for women. You should practice "breast self-awareness." This means understanding the normal appearance and feel of your breasts and may include breast self-examination. Any changes detected, no matter how small, should be reported to a health care provider. Women in their 8s and 30s should have a clinical breast exam (CBE) by a health care provider as part of a regular health exam every 1 to 3 years. After age 70, women should have a CBE every year. Starting at age 25, women should consider having a mammogram (breast X-ray test) every year. Women who have a family history of breast cancer should talk to their health care provider about genetic screening. Women at a high risk of breast cancer should talk to their health care providers about having an MRI and a mammogram every year.  Breast cancer gene (BRCA)-related cancer risk assessment is recommended for women who have family members with BRCA-related cancers. BRCA-related cancers include breast, ovarian, tubal, and peritoneal cancers. Having family members with these cancers may be associated with an increased risk for harmful changes (mutations) in the breast cancer genes BRCA1 and BRCA2. Results of the assessment will determine the need for genetic counseling and  BRCA1 and BRCA2 testing.  Routine pelvic exams to screen for cancer are no longer recommended for nonpregnant women who are considered low risk for cancer of the pelvic organs (ovaries, uterus, and vagina) and who do not have symptoms. Ask your health care provider if a screening pelvic exam is right for you.  If you have had past treatment for cervical cancer or a condition that could lead to cancer, you need Pap tests and screening for cancer for at least 20 years after your treatment. If Pap tests have been discontinued, your risk factors (such as having a new sexual partner) need to be reassessed to determine if screening should be resumed. Some women have medical problems that increase the chance of getting cervical cancer. In these cases, your health care provider may recommend more frequent screening and Pap tests.  The HPV test is an additional test that may be used for cervical cancer screening. The HPV test looks for the virus that can cause the cell changes on the cervix. The cells collected during the Pap test can be  tested for HPV. The HPV test could be used to screen women aged 30 years and older, and should be used in women of any age who have unclear Pap test results. After the age of 30, women should have HPV testing at the same frequency as a Pap test.  Colorectal cancer can be detected and often prevented. Most routine colorectal cancer screening begins at the age of 50 years and continues through age 75 years. However, your health care provider may recommend screening at an earlier age if you have risk factors for colon cancer. On a yearly basis, your health care provider may provide home test kits to check for hidden blood in the stool. Use of a small camera at the end of a tube, to directly examine the colon (sigmoidoscopy or colonoscopy), can detect the earliest forms of colorectal cancer. Talk to your health care provider about this at age 50, when routine screening begins. Direct  exam of the colon should be repeated every 5-10 years through age 75 years, unless early forms of pre-cancerous polyps or small growths are found.  People who are at an increased risk for hepatitis B should be screened for this virus. You are considered at high risk for hepatitis B if:  You were born in a country where hepatitis B occurs often. Talk with your health care provider about which countries are considered high risk.  Your parents were born in a high-risk country and you have not received a shot to protect against hepatitis B (hepatitis B vaccine).  You have HIV or AIDS.  You use needles to inject street drugs.  You live with, or have sex with, someone who has hepatitis B.  You get hemodialysis treatment.  You take certain medicines for conditions like cancer, organ transplantation, and autoimmune conditions.  Hepatitis C blood testing is recommended for all people born from 1945 through 1965 and any individual with known risks for hepatitis C.  Practice safe sex. Use condoms and avoid high-risk sexual practices to reduce the spread of sexually transmitted infections (STIs). STIs include gonorrhea, chlamydia, syphilis, trichomonas, herpes, HPV, and human immunodeficiency virus (HIV). Herpes, HIV, and HPV are viral illnesses that have no cure. They can result in disability, cancer, and death.  You should be screened for sexually transmitted illnesses (STIs) including gonorrhea and chlamydia if:  You are sexually active and are younger than 24 years.  You are older than 24 years and your health care provider tells you that you are at risk for this type of infection.  Your sexual activity has changed since you were last screened and you are at an increased risk for chlamydia or gonorrhea. Ask your health care provider if you are at risk.  If you are at risk of being infected with HIV, it is recommended that you take a prescription medicine daily to prevent HIV infection. This is  called preexposure prophylaxis (PrEP). You are considered at risk if:  You are a heterosexual woman, are sexually active, and are at increased risk for HIV infection.  You take drugs by injection.  You are sexually active with a partner who has HIV.  Talk with your health care provider about whether you are at high risk of being infected with HIV. If you choose to begin PrEP, you should first be tested for HIV. You should then be tested every 3 months for as long as you are taking PrEP.  Osteoporosis is a disease in which the bones lose minerals and strength   with aging. This can result in serious bone fractures or breaks. The risk of osteoporosis can be identified using a bone density scan. Women ages 65 years and over and women at risk for fractures or osteoporosis should discuss screening with their health care providers. Ask your health care provider whether you should take a calcium supplement or vitamin D to reduce the rate of osteoporosis.  Menopause can be associated with physical symptoms and risks. Hormone replacement therapy is available to decrease symptoms and risks. You should talk to your health care provider about whether hormone replacement therapy is right for you.  Use sunscreen. Apply sunscreen liberally and repeatedly throughout the day. You should seek shade when your shadow is shorter than you. Protect yourself by wearing long sleeves, pants, a wide-brimmed hat, and sunglasses year round, whenever you are outdoors.  Once a month, do a whole body skin exam, using a mirror to look at the skin on your back. Tell your health care provider of new moles, moles that have irregular borders, moles that are larger than a pencil eraser, or moles that have changed in shape or color.  Stay current with required vaccines (immunizations).  Influenza vaccine. All adults should be immunized every year.  Tetanus, diphtheria, and acellular pertussis (Td, Tdap) vaccine. Pregnant women should  receive 1 dose of Tdap vaccine during each pregnancy. The dose should be obtained regardless of the length of time since the last dose. Immunization is preferred during the 27th-36th week of gestation. An adult who has not previously received Tdap or who does not know her vaccine status should receive 1 dose of Tdap. This initial dose should be followed by tetanus and diphtheria toxoids (Td) booster doses every 10 years. Adults with an unknown or incomplete history of completing a 3-dose immunization series with Td-containing vaccines should begin or complete a primary immunization series including a Tdap dose. Adults should receive a Td booster every 10 years.  Varicella vaccine. An adult without evidence of immunity to varicella should receive 2 doses or a second dose if she has previously received 1 dose. Pregnant females who do not have evidence of immunity should receive the first dose after pregnancy. This first dose should be obtained before leaving the health care facility. The second dose should be obtained 4-8 weeks after the first dose.  Human papillomavirus (HPV) vaccine. Females aged 13-26 years who have not received the vaccine previously should obtain the 3-dose series. The vaccine is not recommended for use in pregnant females. However, pregnancy testing is not needed before receiving a dose. If a female is found to be pregnant after receiving a dose, no treatment is needed. In that case, the remaining doses should be delayed until after the pregnancy. Immunization is recommended for any person with an immunocompromised condition through the age of 26 years if she did not get any or all doses earlier. During the 3-dose series, the second dose should be obtained 4-8 weeks after the first dose. The third dose should be obtained 24 weeks after the first dose and 16 weeks after the second dose.  Zoster vaccine. One dose is recommended for adults aged 60 years or older unless certain conditions are  present.  Measles, mumps, and rubella (MMR) vaccine. Adults born before 1957 generally are considered immune to measles and mumps. Adults born in 1957 or later should have 1 or more doses of MMR vaccine unless there is a contraindication to the vaccine or there is laboratory evidence of immunity to   each of the three diseases. A routine second dose of MMR vaccine should be obtained at least 28 days after the first dose for students attending postsecondary schools, health care workers, or international travelers. People who received inactivated measles vaccine or an unknown type of measles vaccine during 1963-1967 should receive 2 doses of MMR vaccine. People who received inactivated mumps vaccine or an unknown type of mumps vaccine before 1979 and are at high risk for mumps infection should consider immunization with 2 doses of MMR vaccine. For females of childbearing age, rubella immunity should be determined. If there is no evidence of immunity, females who are not pregnant should be vaccinated. If there is no evidence of immunity, females who are pregnant should delay immunization until after pregnancy. Unvaccinated health care workers born before 1957 who lack laboratory evidence of measles, mumps, or rubella immunity or laboratory confirmation of disease should consider measles and mumps immunization with 2 doses of MMR vaccine or rubella immunization with 1 dose of MMR vaccine.  Pneumococcal 13-valent conjugate (PCV13) vaccine. When indicated, a person who is uncertain of her immunization history and has no record of immunization should receive the PCV13 vaccine. An adult aged 19 years or older who has certain medical conditions and has not been previously immunized should receive 1 dose of PCV13 vaccine. This PCV13 should be followed with a dose of pneumococcal polysaccharide (PPSV23) vaccine. The PPSV23 vaccine dose should be obtained at least 8 weeks after the dose of PCV13 vaccine. An adult aged 19  years or older who has certain medical conditions and previously received 1 or more doses of PPSV23 vaccine should receive 1 dose of PCV13. The PCV13 vaccine dose should be obtained 1 or more years after the last PPSV23 vaccine dose.  Pneumococcal polysaccharide (PPSV23) vaccine. When PCV13 is also indicated, PCV13 should be obtained first. All adults aged 65 years and older should be immunized. An adult younger than age 65 years who has certain medical conditions should be immunized. Any person who resides in a nursing home or long-term care facility should be immunized. An adult smoker should be immunized. People with an immunocompromised condition and certain other conditions should receive both PCV13 and PPSV23 vaccines. People with human immunodeficiency virus (HIV) infection should be immunized as soon as possible after diagnosis. Immunization during chemotherapy or radiation therapy should be avoided. Routine use of PPSV23 vaccine is not recommended for American Indians, [place] Natives, or people younger than 65 years unless there are medical conditions that require PPSV23 vaccine. When indicated, people who have unknown immunization and have no record of immunization should receive PPSV23 vaccine. One-time revaccination 5 years after the first dose of PPSV23 is recommended for people aged 19-64 years who have chronic kidney failure, nephrotic syndrome, asplenia, or immunocompromised conditions. People who received 1-2 doses of PPSV23 before age 65 years should receive another dose of PPSV23 vaccine at age 65 years or later if at least 5 years have passed since the previous dose. Doses of PPSV23 are not needed for people immunized with PPSV23 at or after age 65 years.  Meningococcal vaccine. Adults with asplenia or persistent complement component deficiencies should receive 2 doses of quadrivalent meningococcal conjugate (MenACWY-D) vaccine. The doses should be obtained at least 2 months apart.  Microbiologists working with certain meningococcal bacteria, military recruits, people at risk during an outbreak, and people who travel to or live in countries with a high rate of meningitis should be immunized. A first-year college student up through age   21 years who is living in a residence hall should receive a dose if she did not receive a dose on or after her 16th birthday. Adults who have certain high-risk conditions should receive one or more doses of vaccine.  Hepatitis A vaccine. Adults who wish to be protected from this disease, have certain high-risk conditions, work with hepatitis A-infected animals, work in hepatitis A research labs, or travel to or work in countries with a high rate of hepatitis A should be immunized. Adults who were previously unvaccinated and who anticipate close contact with an international adoptee during the first 60 days after arrival in the [place] from a country with a high rate of hepatitis A should be immunized.  Hepatitis B vaccine. Adults who wish to be protected from this disease, have certain high-risk conditions, may be exposed to blood or other infectious body fluids, are household contacts or sex partners of hepatitis B positive people, are clients or workers in certain care facilities, or travel to or work in countries with a high rate of hepatitis B should be immunized.  Haemophilus influenzae type b (Hib) vaccine. A previously unvaccinated person with asplenia or sickle cell disease or having a scheduled splenectomy should receive 1 dose of Hib vaccine. Regardless of previous immunization, a recipient of a hematopoietic stem cell transplant should receive a 3-dose series 6-12 months after her successful transplant. Hib vaccine is not recommended for adults with HIV infection. Preventive Services / Frequency Ages 64 to 68 years  Blood pressure check.** / Every 1 to 2 years.  Lipid and cholesterol check.** / Every 5 years beginning at age  22.  Clinical breast exam.** / Every 3 years for women in their 88s and 53s.  BRCA-related cancer risk assessment.** / For women who have family members with a BRCA-related cancer (breast, ovarian, tubal, or peritoneal cancers).  Pap test.** / Every 2 years from ages 90 through 51. Every 3 years starting at age 21 through age 56 or 3 with a history of 3 consecutive normal Pap tests.  HPV screening.** / Every 3 years from ages 24 through ages 1 to 46 with a history of 3 consecutive normal Pap tests.  Hepatitis C blood test.** / For any individual with known risks for hepatitis C.  Skin self-exam. / Monthly.  Influenza vaccine. / Every year.  Tetanus, diphtheria, and acellular pertussis (Tdap, Td) vaccine.** / Consult your health care provider. Pregnant women should receive 1 dose of Tdap vaccine during each pregnancy. 1 dose of Td every 10 years.  Varicella vaccine.** / Consult your health care provider. Pregnant females who do not have evidence of immunity should receive the first dose after pregnancy.  HPV vaccine. / 3 doses over 6 months, if 72 and younger. The vaccine is not recommended for use in pregnant females. However, pregnancy testing is not needed before receiving a dose.  Measles, mumps, rubella (MMR) vaccine.** / You need at least 1 dose of MMR if you were born in 1957 or later. You may also need a 2nd dose. For females of childbearing age, rubella immunity should be determined. If there is no evidence of immunity, females who are not pregnant should be vaccinated. If there is no evidence of immunity, females who are pregnant should delay immunization until after pregnancy.  Pneumococcal 13-valent conjugate (PCV13) vaccine.** / Consult your health care provider.  Pneumococcal polysaccharide (PPSV23) vaccine.** / 1 to 2 doses if you smoke cigarettes or if you have certain conditions.  Meningococcal vaccine.** /  1 dose if you are age 19 to 21 years and a first-year college  student living in a residence hall, or have one of several medical conditions, you need to get vaccinated against meningococcal disease. You may also need additional booster doses.  Hepatitis A vaccine.** / Consult your health care provider.  Hepatitis B vaccine.** / Consult your health care provider.  Haemophilus influenzae type b (Hib) vaccine.** / Consult your health care provider. Ages 40 to 64 years  Blood pressure check.** / Every 1 to 2 years.  Lipid and cholesterol check.** / Every 5 years beginning at age 20 years.  Lung cancer screening. / Every year if you are aged 55-80 years and have a 30-pack-year history of smoking and currently smoke or have quit within the past 15 years. Yearly screening is stopped once you have quit smoking for at least 15 years or develop a health problem that would prevent you from having lung cancer treatment.  Clinical breast exam.** / Every year after age 40 years.  BRCA-related cancer risk assessment.** / For women who have family members with a BRCA-related cancer (breast, ovarian, tubal, or peritoneal cancers).  Mammogram.** / Every year beginning at age 40 years and continuing for as long as you are in good health. Consult with your health care provider.  Pap test.** / Every 3 years starting at age 30 years through age 65 or 70 years with a history of 3 consecutive normal Pap tests.  HPV screening.** / Every 3 years from ages 30 years through ages 65 to 70 years with a history of 3 consecutive normal Pap tests.  Fecal occult blood test (FOBT) of stool. / Every year beginning at age 50 years and continuing until age 75 years. You may not need to do this test if you get a colonoscopy every 10 years.  Flexible sigmoidoscopy or colonoscopy.** / Every 5 years for a flexible sigmoidoscopy or every 10 years for a colonoscopy beginning at age 50 years and continuing until age 75 years.  Hepatitis C blood test.** / For all people born from 1945 through  1965 and any individual with known risks for hepatitis C.  Skin self-exam. / Monthly.  Influenza vaccine. / Every year.  Tetanus, diphtheria, and acellular pertussis (Tdap/Td) vaccine.** / Consult your health care provider. Pregnant women should receive 1 dose of Tdap vaccine during each pregnancy. 1 dose of Td every 10 years.  Varicella vaccine.** / Consult your health care provider. Pregnant females who do not have evidence of immunity should receive the first dose after pregnancy.  Zoster vaccine.** / 1 dose for adults aged 60 years or older.  Measles, mumps, rubella (MMR) vaccine.** / You need at least 1 dose of MMR if you were born in 1957 or later. You may also need a 2nd dose. For females of childbearing age, rubella immunity should be determined. If there is no evidence of immunity, females who are not pregnant should be vaccinated. If there is no evidence of immunity, females who are pregnant should delay immunization until after pregnancy.  Pneumococcal 13-valent conjugate (PCV13) vaccine.** / Consult your health care provider.  Pneumococcal polysaccharide (PPSV23) vaccine.** / 1 to 2 doses if you smoke cigarettes or if you have certain conditions.  Meningococcal vaccine.** / Consult your health care provider.  Hepatitis A vaccine.** / Consult your health care provider.  Hepatitis B vaccine.** / Consult your health care provider.  Haemophilus influenzae type b (Hib) vaccine.** / Consult your health care provider. Ages 65   years and over  Blood pressure check.** / Every 1 to 2 years.  Lipid and cholesterol check.** / Every 5 years beginning at age 49 years.  Lung cancer screening. / Every year if you are aged 73-80 years and have a 30-pack-year history of smoking and currently smoke or have quit within the past 15 years. Yearly screening is stopped once you have quit smoking for at least 15 years or develop a health problem that would prevent you from having lung cancer  treatment.  Clinical breast exam.** / Every year after age 76 years.  BRCA-related cancer risk assessment.** / For women who have family members with a BRCA-related cancer (breast, ovarian, tubal, or peritoneal cancers).  Mammogram.** / Every year beginning at age 36 years and continuing for as long as you are in good health. Consult with your health care provider.  Pap test.** / Every 3 years starting at age 30 years through age 34 or 64 years with 3 consecutive normal Pap tests. Testing can be stopped between 65 and 70 years with 3 consecutive normal Pap tests and no abnormal Pap or HPV tests in the past 10 years.  HPV screening.** / Every 3 years from ages 11 years through ages 40 or 5 years with a history of 3 consecutive normal Pap tests. Testing can be stopped between 65 and 70 years with 3 consecutive normal Pap tests and no abnormal Pap or HPV tests in the past 10 years.  Fecal occult blood test (FOBT) of stool. / Every year beginning at age 19 years and continuing until age 29 years. You may not need to do this test if you get a colonoscopy every 10 years.  Flexible sigmoidoscopy or colonoscopy.** / Every 5 years for a flexible sigmoidoscopy or every 10 years for a colonoscopy beginning at age 1 years and continuing until age 76 years.  Hepatitis C blood test.** / For all people born from [birth year] through 1966 and any individual with known risks for hepatitis C.  Osteoporosis screening.** / A one-time screening for women ages 52 years and over and women at risk for fractures or osteoporosis.  Skin self-exam. / Monthly.  Influenza vaccine. / Every year.  Tetanus, diphtheria, and acellular pertussis (Tdap/Td) vaccine.** / 1 dose of Td every 10 years.  Varicella vaccine.** / Consult your health care provider.  Zoster vaccine.** / 1 dose for adults aged 45 years or older.  Pneumococcal 13-valent conjugate (PCV13) vaccine.** / Consult your health care provider.  Pneumococcal  polysaccharide (PPSV23) vaccine.** / 1 dose for all adults aged 28 years and older.  Meningococcal vaccine.** / Consult your health care provider.  Hepatitis A vaccine.** / Consult your health care provider.  Hepatitis B vaccine.** / Consult your health care provider.  Haemophilus influenzae type b (Hib) vaccine.** / Consult your health care provider. ** Family history and personal history of risk and conditions may change your health care provider's recommendations. Document Released: 12/22/2001 Document Revised: 03/12/2014 Document Reviewed: 03/23/2011 [facility] Patient Information 2015 [place], [place]. This information is not intended to replace advice given to you by your health care provider. Make sure you discuss any questions you have with your health care provider. Anemia, Nonspecific Anemia is a condition in which the concentration of red blood cells or hemoglobin in the blood is below normal. Hemoglobin is a substance in red blood cells that carries oxygen to the tissues of the body. Anemia results in not enough oxygen reaching these tissues.  CAUSES  Common causes of  anemia include:   Excessive bleeding. Bleeding may be internal or external. This includes excessive bleeding from periods (in women) or from the intestine.   Poor nutrition.   Chronic kidney, thyroid, and liver disease.  Bone marrow disorders that decrease red blood cell production.  Cancer and treatments for cancer.  HIV, AIDS, and their treatments.  Spleen problems that increase red blood cell destruction.  Blood disorders.  Excess destruction of red blood cells due to infection, medicines, and autoimmune disorders. SIGNS AND SYMPTOMS   Minor weakness.   Dizziness.   Headache.  Palpitations.   Shortness of breath, especially with exercise.   Paleness.  Cold sensitivity.  Indigestion.  Nausea.  Difficulty sleeping.  Difficulty concentrating. Symptoms may occur suddenly or they  may develop slowly.  DIAGNOSIS  Additional blood tests are often needed. These help your health care provider determine the best treatment. Your health care provider will check your stool for blood and look for other causes of blood loss.  TREATMENT  Treatment varies depending on the cause of the anemia. Treatment can include:   Supplements of iron, vitamin O67, or folic acid.   Hormone medicines.   A blood transfusion. This may be needed if blood loss is severe.   Hospitalization. This may be needed if there is significant continual blood loss.   Dietary changes.  Spleen removal. HOME CARE INSTRUCTIONS Keep all follow-up appointments. It often takes many weeks to correct anemia, and having your health care provider check on your condition and your response to treatment is very important. SEEK IMMEDIATE MEDICAL CARE IF:   You develop extreme weakness, shortness of breath, or chest pain.   You become dizzy or have trouble concentrating.  You develop heavy vaginal bleeding.   You develop a rash.   You have bloody or black, tarry stools.   You faint.   You vomit up blood.   You vomit repeatedly.   You have abdominal pain.  You have a fever or persistent symptoms for more than 2-3 days.   You have a fever and your symptoms suddenly get worse.   You are dehydrated.  MAKE SURE YOU:  Understand these instructions.  Will watch your condition.  Will get help right away if you are not doing well or get worse. Document Released: 12/03/2004 Document Revised: 06/28/2013 Document Reviewed: 04/21/2013 [facility] Patient Information 2015 [place], [place]. This information is not intended to replace advice given to you by your health care provider. Make sure you discuss any questions you have with your health care provider.

## 2015-05-28 NOTE — Telephone Encounter (Signed)
Left a message for patient to call back. 

## 2015-05-28 NOTE — Progress Notes (Signed)
Subjective:  Patient ID: Latasha Wood, female    DOB: Dec 04, 1955  Age: 59 y.o. MRN: [id number]  CC: Annual Exam; Anemia; and Hypertension   HPI [name] presents for a complete physical but she also has complaints. She was recently admitted to the hospital for small bowel obstruction and she fortunately feels like that has resolved. She does not have any abdominal pain today. She was found to be anemic in the hospital. She complains of persistent diarrhea and bloody stool and says she plans to see her gastroenterologist soon. She complains of numbness in her arms and legs for several weeks now. She complains of shortness of breath that occurs at rest and also with exertion. She has previously had a cardiac workup that was negative.  Outpatient Prescriptions Prior to Visit  Medication Sig Dispense Refill  . ALPRAZolam (XANAX) 0.25 MG tablet Take 1 tablet (0.25 mg total) by mouth 2 (two) times daily as needed for anxiety. 60 tablet 0  . calcium-vitamin D (OSCAL WITH D) 500-200 MG-UNIT per tablet Take 1 tablet by mouth daily.      Marland Kitchen glycopyrrolate (ROBINUL) 2 MG tablet Take 1 tablet (2 mg total) by mouth 2 (two) times daily. 60 tablet 1  . mesalamine (LIALDA) 1.2 G EC tablet Take 2 tablets by mouth every morning and 2 tablets by mouth every evening. 360 tablet 1  . ondansetron (ZOFRAN) 4 MG tablet Take 1 tablet (4 mg total) by mouth every 6 (six) hours as needed for nausea or vomiting. 20 tablet 0  . predniSONE (DELTASONE) 10 MG tablet Take 55m every AM 100 tablet 0  . traMADol (ULTRAM) 50 MG tablet Take 1/2-1 tablet by mouth every 6 hours as needed 30 tablet 0  . BENICAR HCT 40-12.5 MG per tablet Take 1 tablet by mouth  daily 90 tablet 1   No facility-administered medications prior to visit.    ROS Review of Systems  Constitutional: Positive for fatigue. Negative for fever, chills, diaphoresis, activity change, appetite change and unexpected weight change.  HENT: Negative.    Eyes: Negative.   Respiratory: Positive for shortness of breath (DOE for several months). Negative for apnea, cough, choking, chest tightness, wheezing and stridor.   Cardiovascular: Negative.  Negative for chest pain, palpitations and leg swelling.  Gastrointestinal: Positive for diarrhea and blood in stool. Negative for nausea, vomiting, abdominal pain, constipation, abdominal distention and rectal pain.  Endocrine: Negative.   Genitourinary: Negative.  Negative for dysuria, urgency, frequency, hematuria, flank pain and decreased urine volume.  Musculoskeletal: Negative.  Negative for myalgias, back pain, joint swelling and arthralgias.  Skin: Negative.  Negative for rash.  Allergic/Immunologic: Negative.   Neurological: Positive for numbness. Negative for dizziness, tremors, seizures, syncope, facial asymmetry, speech difficulty, weakness, light-headedness and headaches.  Hematological: Negative for adenopathy. Does not bruise/bleed easily.  Psychiatric/Behavioral: Negative.     Objective:  BP 140/97 mmHg  Pulse 85  Temp(Src) 98.8 F (37.1 C) (Oral)  Ht 5' 3"  (1.6 m)  Wt 197 lb 8 oz (89.585 kg)  BMI 34.99 kg/m2  SpO2 95%  BP Readings from Last 3 Encounters:  05/28/15 140/97  05/19/15 121/65  04/16/15 120/80    Wt Readings from Last 3 Encounters:  05/28/15 197 lb 8 oz (89.585 kg)  05/17/15 190 lb (86.183 kg)  04/16/15 195 lb 9.6 oz (88.724 kg)    Physical Exam  Constitutional: She is oriented to person, place, and time. She appears well-developed and well-nourished.  Non-toxic appearance.  She does not have a sickly appearance. She does not appear ill. No distress.  HENT:  Head: Normocephalic and atraumatic.  Mouth/Throat: Oropharynx is clear and moist. No oropharyngeal exudate.  Eyes: Conjunctivae are normal. Right eye exhibits no discharge. Left eye exhibits no discharge. No scleral icterus.  Neck: Normal range of motion. Neck supple. No JVD present. No tracheal  deviation present. No thyromegaly present.  Cardiovascular: Normal rate, regular rhythm, normal heart sounds and intact distal pulses.  Exam reveals no gallop and no friction rub.   No murmur heard. Pulmonary/Chest: Effort normal and breath sounds normal. No stridor. No respiratory distress. She has no wheezes. She has no rales. She exhibits no tenderness.  Abdominal: Soft. Normal appearance and bowel sounds are normal. She exhibits no shifting dullness, no distension, no pulsatile liver, no fluid wave, no abdominal bruit, no ascites, no pulsatile midline mass and no mass. There is no hepatosplenomegaly, splenomegaly or hepatomegaly. There is no tenderness. There is no rebound, no guarding and no CVA tenderness. No hernia. Hernia confirmed negative in the ventral area, confirmed negative in the right inguinal area and confirmed negative in the left inguinal area.  Musculoskeletal: Normal range of motion. She exhibits no edema or tenderness.  Lymphadenopathy:    She has no cervical adenopathy.  Neurological: She is oriented to person, place, and time.  Skin: Skin is warm and dry. No rash noted. She is not diaphoretic. No erythema. No pallor.  Psychiatric: She has a normal mood and affect. Her behavior is normal. Judgment and thought content normal.  Vitals reviewed.   Lab Results  Component Value Date   WBC 13.3* 05/28/2015   HGB 12.3 05/28/2015   HCT 36.7 05/28/2015   PLT 363.0 05/28/2015   GLUCOSE 84 05/28/2015   CHOL 245* 05/28/2015   TRIG 120.0 05/28/2015   HDL 74.50 05/28/2015   LDLDIRECT 157.8 09/05/2012   LDLCALC 147* 05/28/2015   ALT 18 05/18/2015   AST 15 05/18/2015   NA 139 05/28/2015   K 3.3* 05/28/2015   CL 103 05/28/2015   CREATININE 0.89 05/28/2015   BUN 20 05/28/2015   CO2 28 05/28/2015   TSH 1.123 05/17/2015    Ct Abdomen Pelvis W Contrast  05/17/2015   CLINICAL DATA:  Severe abdominal pain.  Leukocytosis.  EXAM: CT ABDOMEN AND PELVIS WITH CONTRAST  TECHNIQUE:  Multidetector CT imaging of the abdomen and pelvis was performed using the standard protocol following bolus administration of intravenous contrast.  CONTRAST:  100 cc Omnipaque 300 intravenous  COMPARISON:  06/24/2011  FINDINGS: BODY WALL: No contributory findings.  LOWER CHEST: No contributory findings.  ABDOMEN/PELVIS:  Liver: Benign sub cm low-density in the left liver on image 15, essentially stable from 2012.  Biliary: No evidence of biliary obstruction or stone.  Pancreas: Duct anatomy consistent with divisum. There is chronic prominence of the main and accessory pancreatic ducts measuring up to 3 mm. No active inflammatory change.  Spleen: Unremarkable.  Adrenals: Unremarkable.  Kidneys and ureters: No hydronephrosis or stone. Stable benign cyst in the right kidney.  Bladder: Unremarkable.  Reproductive: Hysterectomy and probable oophorectomies.  Bowel: Dilated fluid-filled bowel with small reactive ascites and mesenteric congestion, leading to a focal transition point in the pelvis on image 62. No pneumoperitoneum or pneumatosis. This is most likely stricture or adhesion. No active inflammatory enteritis is noted in this patient with history of ulcerative colitis. No appendicitis.  Retroperitoneum: No mass or adenopathy.  Vascular: No acute abnormality.  OSSEOUS: No acute  abnormalities. Lower lumbar facet arthropathy. No sacroiliitis.  IMPRESSION: 1. Small bowel obstruction from stricture or adhesion in the pelvis. This is the same site of obstruction as in 2012. 2. Pancreas divisum.   Electronically Signed   By: Monte Fantasia M.D.   On: 05/17/2015 05:42   Dg Abd 2 Views  05/18/2015   CLINICAL DATA:  Abdominal pain, weakness, NG tube  EXAM: ABDOMEN - 2 VIEW  COMPARISON:  CT abdomen pelvis dated 05/17/2015  FINDINGS: Nonobstructive bowel gas pattern.  Contrast in the distal small bowel and right colon.  No evidence of free air under the diaphragm on the upright view.  Enteric tube terminates in the  gastric cardia.  Visualized osseous structures are within normal limits.  IMPRESSION: No evidence of small bowel obstruction or free air.  Enteric tube terminates in the gastric cardia.   Electronically Signed   By: Julian Hy M.D.   On: 05/18/2015 09:08   Dg Abd Acute W/chest  05/17/2015   CLINICAL DATA:  Abdominal pain.  History of ulcerative colitis.  EXAM: DG ABDOMEN ACUTE W/ 1V CHEST  COMPARISON:  06/25/2011  FINDINGS: Bubbles of gas within in the left abdomen are linearly arranged. This could reflect an enteritis or partial obstruction. No evidence of bowel distention. No high-grade bowel obstruction is suspected as there is no decompression of colon. No evidence of bowel perforation. No concerning intra-abdominal mass effect or calcification.  Normal heart size and mediastinal contours. No acute infiltrate or edema. No effusion or pneumothorax. No acute osseous findings.  IMPRESSION: Fluid-filled proximal small bowel as can be seen with enteritis or partial obstruction.   Electronically Signed   By: Monte Fantasia M.D.   On: 05/17/2015 03:34    Assessment & Plan:   Dazia was seen today for annual exam, anemia and hypertension.  Diagnoses and all orders for this visit:  SBO (small bowel obstruction)- this has resolved, will replete her potassium deficiency Orders: -     Basic metabolic panel; Future  Ulcerative colitis with intestinal obstruction- according to her she is going to start a new medication for this at the direction of her gastroenterologist. Orders: -     Basic metabolic panel; Future  Pernicious anemia- the anemia has resolved and her vitamin levels are normal. Orders: -     CBC with Differential/Platelet; Future -     Basic metabolic panel; Future -     IBC panel; Future -     Vitamin B12; Future -     Ferritin; Future -     Folate; Future  DOE (dyspnea on exertion)- her exam is normal today. Will check labs to look for secondary causes. Her EKG shows left  atrial enlargement so I will refer to cardiology for further evaluation. Orders: -     EKG 12-Lead -     Ambulatory referral to Cardiology -     Brain natriuretic peptide; Future  Need for prophylactic vaccination against Streptococcus pneumoniae (pneumococcus) Orders: -     Pneumococcal conjugate vaccine 13-valent IM  Routine general medical examination at a health care facility- exam done, mammogram and colonoscopy are up-to-date. Labs ordered. Vaccines were reviewed and updated. Orders: -     Lipid panel; Future  Essential hypertension- her blood pressure is adequately well controlled, will replete her potassium deficiency. Orders: -     Discontinue: olmesartan-hydrochlorothiazide (BENICAR HCT) 40-12.5 MG per tablet; Take 1 tablet by mouth  daily -     olmesartan-hydrochlorothiazide (BENICAR HCT) 40-12.5  MG per tablet; Take 1 tablet by mouth  daily -     potassium chloride SA (K-DUR,KLOR-CON) 20 MEQ tablet; Take 1 tablet (20 mEq total) by mouth daily.  Hypokalemia- start K-Lor Orders: -     potassium chloride SA (K-DUR,KLOR-CON) 20 MEQ tablet; Take 1 tablet (20 mEq total) by mouth daily.   I have discontinued Ms. Lupinacci's BENICAR HCT. I am also having her start on potassium chloride SA. Additionally, I am having her maintain her calcium-vitamin D, traMADol, mesalamine, ALPRAZolam, glycopyrrolate, predniSONE, ondansetron, and olmesartan-hydrochlorothiazide.  Meds ordered this encounter  Medications  . DISCONTD: olmesartan-hydrochlorothiazide (BENICAR HCT) 40-12.5 MG per tablet    Sig: Take 1 tablet by mouth  daily    Dispense:  90 tablet    Refill:  1  . olmesartan-hydrochlorothiazide (BENICAR HCT) 40-12.5 MG per tablet    Sig: Take 1 tablet by mouth  daily    Dispense:  90 tablet    Refill:  1  . potassium chloride SA (K-DUR,KLOR-CON) 20 MEQ tablet    Sig: Take 1 tablet (20 mEq total) by mouth daily.    Dispense:  30 tablet    Refill:  11     Follow-up: Return in about  2 months (around 07/29/2015).  Scarlette Calico, MD

## 2015-05-28 NOTE — Progress Notes (Signed)
Pre visit review using our clinic review tool, if applicable. No additional management support is needed unless otherwise documented below in the visit note. 

## 2015-05-29 NOTE — Telephone Encounter (Signed)
Left a message for patient to call back. 

## 2015-05-30 NOTE — Telephone Encounter (Signed)
Left a message for patient to call back. 

## 2015-06-10 NOTE — Telephone Encounter (Signed)
Letter created and mailed to patient.

## 2015-06-17 LAB — HEMOGLOBIN A1C: HEMOGLOBIN A1C: 6.6 % — AB (ref 4.0–6.0)

## 2015-06-21 ENCOUNTER — Telehealth: Payer: Self-pay | Admitting: Internal Medicine

## 2015-06-21 NOTE — Telephone Encounter (Signed)
Spoke with patient and she is scheduled to see Dr. Marcello Moores on August 29 to talk and schedule surgery. She has been doing well until yesterday. She saw bright, red blood and had more gas. She is on Prednisone 10 mg daily. Please, advise.

## 2015-06-21 NOTE — Telephone Encounter (Signed)
Please increase Prednisone to 15 mg/day x 5 days then down to 10 mg/day.

## 2015-06-21 NOTE — Telephone Encounter (Signed)
Left a message for patient to call back. 

## 2015-06-24 MED ORDER — PREDNISONE 10 MG PO TABS: ORAL_TABLET | ORAL | Status: DC

## 2015-06-24 NOTE — Telephone Encounter (Signed)
Left a message for patient to call back. 

## 2015-06-24 NOTE — Telephone Encounter (Signed)
I agree with going up on prednisone 30 mg x 2 weeks then go down by 5 mg every 2 weeks

## 2015-06-24 NOTE — Telephone Encounter (Signed)
Spoke with patient and she states she had increased bleeding over the weekend and she increased her Prednisone to 30 mg/day. She will titrate it down. She is out of town in [place]. At this time. Scheduled OV with Dr. Havery Moros on 08/16/15.

## 2015-06-25 NOTE — Telephone Encounter (Signed)
Spoke with patient and gave her Dr. Nichola Sizer recommendations.

## 2015-07-18 ENCOUNTER — Encounter: Payer: Self-pay | Admitting: Nurse Practitioner

## 2015-07-18 ENCOUNTER — Ambulatory Visit (INDEPENDENT_AMBULATORY_CARE_PROVIDER_SITE_OTHER): Payer: 59 | Admitting: Cardiovascular Disease

## 2015-07-18 ENCOUNTER — Encounter: Payer: Self-pay | Admitting: Cardiovascular Disease

## 2015-07-18 VITALS — BP 124/88 | HR 61 | Ht 64.0 in | Wt 199.4 lb

## 2015-07-18 DIAGNOSIS — R0600 Dyspnea, unspecified: Secondary | ICD-10-CM | POA: Diagnosis not present

## 2015-07-18 DIAGNOSIS — R0609 Other forms of dyspnea: Secondary | ICD-10-CM

## 2015-07-18 NOTE — Progress Notes (Signed)
Cardiology Office Note   Date:  07/18/2015   ID:  Latasha Wood, DOB 03/01/1956, MRN [id number]  PCP:  Scarlette Calico, MD  Cardiologist:   Acie Fredrickson Wonda Cheng, MD   Chief Complaint  Patient presents with  . Shortness of Breath   Problem List 1. Shortness of breath 2. Ulcerative colitis  3. Hyperlipidemia 4.  Essential hypertension     History of Present Illness: Latasha Wood is a 59 y.o. female who presents for  Shortness of breath.  Husband is a Chief Executive Officer [employer] [phone])   For the past year or so, has had progressive shortness of breath. Has gained some weight - is on prednisone for Ulcerative colitis. 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 Hypertension   . Colitis, ulcerative   . Hyperlipidemia   . Colon polyps   . Blood transfusion   . Anemia     Past Surgical History  Procedure Laterality Date  . Gravida 6 para 2      all SAB  . Hyadiform mole    . Dilation and curettage of uterus      most likely after miscarriage  . Knee arthroscopy w/ meniscal repair      right knee (Daldorf)  . Fibrocystic breast disease      q 6 month mammogram  . Abdominal hysterectomy  2000     Current Outpatient Prescriptions  Medication Sig Dispense Refill  . ALPRAZolam (XANAX) 0.25 MG tablet Take 1 tablet (0.25 mg total) by mouth 2 (two) times daily as needed for anxiety. 60 tablet 0  . calcium-vitamin D (OSCAL WITH D) 500-200 MG-UNIT per tablet Take 1 tablet by mouth daily.      Marland Kitchen glycopyrrolate (ROBINUL) 2 MG tablet Take 1 tablet (2 mg total) by mouth 2 (two) times daily. 60 tablet 1  . mesalamine (LIALDA) 1.2 G EC tablet Take 2 tablets by mouth every morning and 2 tablets by mouth every evening. 360 tablet 1  . olmesartan-hydrochlorothiazide (BENICAR HCT) 40-12.5 MG per tablet Take 1 tablet by mouth   daily 90 tablet 1  . ondansetron (ZOFRAN) 4 MG tablet Take 1 tablet (4 mg total) by mouth every 6 (six) hours as needed for nausea or vomiting. 20 tablet 0  . potassium chloride SA (K-DUR,KLOR-CON) 20 MEQ tablet Take 1 tablet (20 mEq total) by mouth daily. 30 tablet 11  . predniSONE (DELTASONE) 10 MG tablet Take by mouth daily with breakfast. Take 30-41m by mouth once daily    . traMADol (ULTRAM) 50 MG tablet Take by mouth every 6 (six) hours as needed (Take 1/2 to 1 tablet by mouth every 6 hours as needed for pain).     No current facility-administered medications for this visit.    Allergies:   Review of patient's allergies indicates no known allergies.    Social History:  The patient  reports that she has never smoked. She has never used smokeless tobacco. She reports that she drinks about 1.0 oz of alcohol per week. She reports that she does not use illicit drugs.   Family History:  The patient's family history includes Breast cancer (age of onset: 32 in her sister; Breast cancer (age of onset: 536 in her mother; Colon cancer in her paternal aunt; Diabetes in her brother,  father, mother, and sister; Heart disease in her father.    ROS:  Please see the history of present illness.    Review of Systems: Constitutional:  denies fever, chills, diaphoresis, appetite change and fatigue.  HEENT: denies photophobia, eye pain, redness, hearing loss, ear pain, congestion, sore throat, rhinorrhea, sneezing, neck pain, neck stiffness and tinnitus.  Respiratory: denies SOB, DOE, cough, chest tightness, and wheezing.  Cardiovascular: denies chest pain, palpitations and leg swelling.  Gastrointestinal: denies nausea, vomiting, abdominal pain, diarrhea, constipation, blood in stool.  Genitourinary: denies dysuria, urgency, frequency, hematuria, flank pain and difficulty urinating.  Musculoskeletal: denies  myalgias, back pain, joint swelling, arthralgias and gait problem.   Skin: denies pallor, rash  and wound.  Neurological: denies dizziness, seizures, syncope, weakness, light-headedness, numbness and headaches.   Hematological: denies adenopathy, easy bruising, personal or family bleeding history.  Psychiatric/ Behavioral: denies suicidal ideation, mood changes, confusion, nervousness, sleep disturbance and agitation.       All other systems are reviewed and negative.    PHYSICAL EXAM: VS:  BP 124/88 mmHg  Pulse 61  Ht 5' 4"  (1.626 m)  Wt 90.447 kg (199 lb 6.4 oz)  BMI 34.21 kg/m2  SpO2 99% , BMI Body mass index is 34.21 kg/(m^2). GEN: Well nourished, well developed, in no acute distress HEENT: normal Neck: no JVD, carotid bruits, or masses Cardiac: RRR; no murmurs, rubs, or gallops,no edema  Respiratory:  clear to auscultation bilaterally, normal work of breathing GI: soft, nontender, nondistended, + BS MS: no deformity or atrophy Skin: warm and dry, no rash Neuro:  Strength and sensation are intact Psych: normal   EKG:  EKG is not ordered today. The ekg ordered July 19  demonstrates  NSR , no ST or T wave changes. ? LAE    Recent Labs: 05/17/2015: TSH 1.123 05/18/2015: ALT 18 05/28/2015: BUN 20; Creatinine, Ser 0.89; Hemoglobin 12.3; Platelets 363.0; Potassium 3.3*; Sodium 139    Lipid Panel    Component Value Date/Time   CHOL 245* 05/28/2015 1006   TRIG 120.0 05/28/2015 1006   HDL 74.50 05/28/2015 1006   CHOLHDL 3 05/28/2015 1006   VLDL 24.0 05/28/2015 1006   LDLCALC 147* 05/28/2015 1006   LDLDIRECT 157.8 09/05/2012 1113      Wt Readings from Last 3 Encounters:  07/18/15 90.447 kg (199 lb 6.4 oz)  05/28/15 89.585 kg (197 lb 8 oz)  05/17/15 86.183 kg (190 lb)      Other studies Reviewed: Additional studies/ records that were reviewed today include: . Review of the above records demonstrates:    ASSESSMENT AND PLAN:  1.  Shortness of breath: The patient presents with shortness breath. She's also had a 30 pound weight gain over the past year and has  been relatively inactive. She used to play competitive tennis has not been playing in the past years her.  I suspect that her shortness of breath is due to her inactivity and her weight gain. We will get an echocardiogram to review her left ventricular function. We have an old echo from 2012 that showed normal left ventricular function. If the echo is normal then we will have her continue with an exercise and weight loss program. I suspect that she will improve with exercise and weight loss.  I'll see her on an as-needed basis.   Current medicines are reviewed at length with the patient today.  The patient does not have concerns regarding medicines.  The following changes have been made:  no change  Labs/ tests ordered today include:  No orders of the defined types were placed in this encounter.     Disposition:   FU with me as needed     Nahser, Wonda Cheng, MD  07/18/2015 9:36 AM    [facility] [place], [place], [place]  [address] Phone: [phone]; Fax: [phone]   [facility]  [address] [place], [place]  [address] [phone]   Fax [phone]

## 2015-07-18 NOTE — Patient Instructions (Signed)
Medication Instructions:  Your physician recommends that you continue on your current medications as directed. Please refer to the Current Medication list given to you today.   Labwork: None Ordered   Testing/Procedures: Your physician has requested that you have an echocardiogram. Echocardiography is a painless test that uses sound waves to create images of your heart. It provides your doctor with information about the size and shape of your heart and how well your heart's chambers and valves are working. This procedure takes approximately one hour. There are no restrictions for this procedure.    Follow-Up: Your physician recommends that you schedule a follow-up appointment in: as needed with Dr. Acie Fredrickson

## 2015-07-24 ENCOUNTER — Other Ambulatory Visit: Payer: Self-pay | Admitting: General Surgery

## 2015-07-24 NOTE — H&P (Signed)
Latasha Wood 07/24/2015 11:10 AM Location: [facility] Patient #: [id number] DOB: 05-19-1956 Married / Language: English / Race: Black or African American Female History of Present Illness Leighton Ruff MD; 9/92/4268 12:49 PM) Patient words: discuss surgery.  The patient is a 59 year old female who presents with abdominal pain. This is a 59 year old female who has been undergoing treatment for ulcerative colitis for more than 30 years. She was being treated by Dr. Olevia Perches. She is here to discuss surgical management of ulcerative colitis. She did see a cardiologist recently regarding her new onset shortness of breath. It was felt to mostly be due to deconditioning, although an echocardiogram was going to be obtained later this month. She currently has 3-4 bowel movements a day with occasional bleeding. She has some crampy abdominal pain and joint pain as well. She is currently on 40 mg of prednisone a day and is off of her Humira right now. She has been off of this for over 6 weeks. Past surgical history is significant for hysterectomy only. She did have a recent hospitalization for small bowel obstruction. Biopsies of her most recent colonoscopy showed no signs of dysplasia. Problem List/Past Medical Leighton Ruff, MD; 3/41/9622 12:49 PM) CHRONIC ULCERATIVE COLITIS WITHOUT COMPLICATION (W97.98)  Other Problems Leighton Ruff, MD; 07/30/1940 12:49 PM) High blood pressure Hypercholesterolemia Ulcerative Colitis  Past Surgical History Leighton Ruff, MD; 7/40/8144 12:49 PM) Colon Polyp Removal - Colonoscopy Hysterectomy (not due to cancer) - Partial  Diagnostic Studies History Leighton Ruff, MD; 06/26/5630 12:49 PM) Colonoscopy within last year Mammogram within last year Pap Smear >5 years ago  Allergies Mammie Lorenzo, LPN; 4/97/0263 78:58 AM) No Known Drug Allergies 05/14/2015  Medication History Leighton Ruff, MD; 8/50/2774 12:49  PM) Glycopyrrolate (2MG Tablet, Oral) Active. Potassium Chloride Crys ER (20MEQ Tablet ER, Oral) Active. PredniSONE (10MG Tablet, Oral) Active. Oscal 500/200 D-3 (500-200MG-UNIT Tablet, Oral) Active. Ultram (50MG Tablet, Oral as needed) Active. Zofran (4MG Tablet, Oral as needed) Active. Lialda (1.2GM Tablet DR, Oral) Active. Benicar HCT (40-12.5MG Tablet, Oral) Active. ALPRAZolam (0.25MG Tablet, Oral) Active. Medications Reconciled Neomycin Sulfate (500MG Tablet, 2 (two) Tablet Oral SEE NOTE, Taken starting 07/24/2015) Active. (TAKE TWO TABLETS AT 2 PM, 3 PM, AND 10 PM THE DAY PRIOR TO SURGERY) Flagyl (500MG Tablet, 2 (two) Tablet Oral SEE NOTE, Taken starting 07/24/2015) Active. (Take at 2pm, 3pm, and 10pm the day prior to your colon operation)  Social History Leighton Ruff, MD; 12/06/7865 12:49 PM) Caffeine use Carbonated beverages, Coffee, Tea. No drug use Tobacco use Never smoker. Alcohol use Occasional alcohol use.  Family History Leighton Ruff, MD; 6/72/0947 12:49 PM) Ischemic Bowel Disease Brother, Sister. Alcohol Abuse Mother. Arthritis Mother. Diabetes Mellitus Sister. Heart Disease Father. Hypertension Brother, Father, Mother, Sister. Breast Cancer Mother, Sister. Colon Polyps Brother. Depression Mother, Sister.  Pregnancy / Birth History Leighton Ruff, MD; 0/96/2836 12:49 PM) Maternal age 9-20 Para 2 Irregular periods Age at menarche 38 years. Age of menopause 56-60 Gravida 6    Vitals Mammie Lorenzo LPN; 05/07/4764 46:50 AM) 07/24/2015 11:10 AM Weight: 198 lb Height: 64in Body Surface Area: 2.01 m Body Mass Index: 33.99 kg/m Temp.: 98.70F(Oral)  Pulse: 86 (Regular)  BP: 128/84 (Sitting, Left Arm, Standard)     Physical Exam Leighton Ruff MD; 3/54/6568 12:50 PM)  General Mental Status-Alert. General Appearance-Consistent with stated age. Hydration-Well hydrated. Voice-Normal.  Head and  Neck Head-normocephalic, atraumatic with no lesions or palpable masses. Trachea-midline. Thyroid Gland Characteristics - normal size and consistency.  Eye Eyeball -  Bilateral-Extraocular movements intact. Sclera/Conjunctiva - Bilateral-No scleral icterus.  Chest and Lung Exam Chest and lung exam reveals -quiet, even and easy respiratory effort with no use of accessory muscles and on auscultation, normal breath sounds, no adventitious sounds and normal vocal resonance. Inspection Chest Wall - Normal. Back - normal.  Cardiovascular Cardiovascular examination reveals -normal heart sounds, regular rate and rhythm with no murmurs and normal pedal pulses bilaterally.  Abdomen Inspection Inspection of the abdomen reveals - No Hernias. Skin - Scar - Note: Pfannenstiel scar noted. Palpation/Percussion Palpation and Percussion of the abdomen reveal - Soft, Non Tender, No Rebound tenderness, No Rigidity (guarding) and No hepatosplenomegaly.  Neurologic Neurologic evaluation reveals -alert and oriented x 3 with no impairment of recent or remote memory. Mental Status-Normal.  Musculoskeletal Normal Exam - Left-Upper Extremity Strength Normal and Lower Extremity Strength Normal. Normal Exam - Right-Upper Extremity Strength Normal and Lower Extremity Strength Normal.    Assessment & Plan Leighton Ruff MD; 1/77/9390 12:47 PM)  CHRONIC ULCERATIVE COLITIS WITHOUT COMPLICATION (Z00.92) Impression: 59 year old female with chronic ulcerative colitis. Given that she is still on 40 mg of prednisone a day, I have recommended a total abdominal colectomy performed in a minimally invasive fashion within the ileostomy. That will allow [place] to taper off of the steroids and give her a chance to evaluate life with a ileostomy. We can then come back and do a second stage procedure and remove the rectum and place a J-pouch or leave the ileostomy depending on her preference. We have discussed  this in detail. All questions were answered. The surgery and anatomy were described to the patient as well as the risks of surgery and the possible complications. These include: Bleeding, deep abdominal infections and possible wound complications such as hernia and infection, damage to adjacent structures, leak of surgical connections, which can lead to other surgeries and possibly an ostomy, possible need for other procedures, such as abscess drains in radiology, possible prolonged hospital stay, possible diarrhea from removal of part of the colon, possible constipation from narcotics, possible bowel, bladder or sexual dysfunction if having rectal surgery, prolonged fatigue/weakness or appetite loss, possible early recurrence of of disease, possible complications of their medical problems such as heart disease or arrhythmias or lung problems, death (less than 1%). I believe the patient understands and wishes to proceed with the surgery.

## 2015-07-29 ENCOUNTER — Telehealth: Payer: Self-pay | Admitting: Gastroenterology

## 2015-07-29 NOTE — Telephone Encounter (Signed)
Patient states she saw her surgeon last week and is hoping for surgery in November. She also saw her cardiologist and is scheduled for ECHO. She states she is having a lot of problems with diarrhea and for the last 2-3 weeks, has had undigested food and pills in stools. She is unable to taper Prednisone and is currently on 40 mg daily. She is concerned that something new is going on. She is scheduled for OV on 08/16/15. No extender OV this week. Please, advise.

## 2015-07-30 NOTE — Telephone Encounter (Signed)
Left a message for patient to call back. 

## 2015-07-30 NOTE — Telephone Encounter (Signed)
Spoke with patient and she will come tomorrow AM at 8:30 AM as per MD request.

## 2015-07-30 NOTE — Telephone Encounter (Signed)
I would like her to have a lab test for C diff but I may be able to see her in the office tomorrow morning. Can you see if you can get ahold of her for Wed AM? Thanks. If not let me know and will see where else we can get her in. Thanks

## 2015-07-31 ENCOUNTER — Other Ambulatory Visit: Payer: 59

## 2015-07-31 ENCOUNTER — Ambulatory Visit (INDEPENDENT_AMBULATORY_CARE_PROVIDER_SITE_OTHER): Payer: 59 | Admitting: Gastroenterology

## 2015-07-31 ENCOUNTER — Encounter: Payer: Self-pay | Admitting: Gastroenterology

## 2015-07-31 VITALS — BP 116/70 | HR 76 | Ht 63.5 in | Wt 196.4 lb

## 2015-07-31 DIAGNOSIS — K51919 Ulcerative colitis, unspecified with unspecified complications: Secondary | ICD-10-CM

## 2015-07-31 MED ORDER — MESALAMINE 4 G RE ENEM: 4.0000 g | ENEMA | Freq: Every day | RECTAL | Status: DC

## 2015-07-31 NOTE — Progress Notes (Signed)
HPI :  59 y/o female here for follow up, well known to Dr. Olevia Perches, new patient to me, for ulcerative colitis. Predominantly left sided disease although with some pancolonic activity on colonoscopy when last done in May 2016.   She was diagnosed with UC at age 53. Mostly left sided diseases, although the entire colon is involved. She has been on mesalamine in the past, currently is on Lialda 4.8 gm daily. She has been on 6MP in the past but had it stopped when she was started on Humira over a year ago. She has been off 6MP for the past 1.5 years. She was on Humira for about a year and a half but had it stopped last May when she was found to have antibodies to it and ultimately lost response. She initially had felt quite well on Humira and her initial response was quite good. She reports over time she has had times of remission but also multiple flares of disease, she thinks 1-2 flares per year on average. She thinks around age 47 she had very few flares and had done well for a period of time, however more recently she has not been doing well over the past year.   She has been on several courses of prednisone throughout her course, more recently she has not been able to get off prednisone without flaring her symptoms. Most recently she has been doing poorly and now back on 104m prednisone in August, and now on 370mper day. She is having frequent stools while on this dose, but is able to function and work. Her stool frequency can vary but she has stools multiple times per day, loose. She has increased urgency of stools. She reports her food does not seem digested and can see undigested food in her stools. She has been having some blood in her stools. She has been having some lower abdominal cramps which come and go with her bowel movements.    She has seen surgery for colectomy evaluation but does not want to have surgery if at all possible.   Of note, she was admitted for a small bowel obstruction in  recent months, which was thought to be due to adhesions without inflammatory changes in the small bowel. This second time she had a bowel obstruction like this, first time was several years ago. No NSAIDs. She does not take any enema therapy.    Past Medical History  Diagnosis Date  . Hypertension   . Colitis, ulcerative   . Hyperlipidemia   . Colon polyps   . Blood transfusion   . Anemia      Past Surgical History  Procedure Laterality Date  . Gravida 6 para 2      all SAB  . Hyadiform mole    . Dilation and curettage of uterus      most likely after miscarriage  . Knee arthroscopy w/ meniscal repair      right knee (Daldorf)  . Fibrocystic breast disease      q 6 month mammogram  . Abdominal hysterectomy  2000   Family History  Problem Relation Age of Onset  . Diabetes Mother   . Breast cancer Mother [id number]. Diabetes Father   . Heart disease Father   . Diabetes Sister   . Colon cancer Paternal Aunt   . Diabetes Brother   . Breast cancer Sister [id number] Social History  Substance Use Topics  . Smoking status: Never Smoker   .  Smokeless tobacco: Never Used  . Alcohol Use: 1.0 oz/week    2 Standard drinks or equivalent per week     Comment: occ   Current Outpatient Prescriptions  Medication Sig Dispense Refill  . ALPRAZolam (XANAX) 0.25 MG tablet Take 1 tablet (0.25 mg total) by mouth 2 (two) times daily as needed for anxiety. 60 tablet 0  . calcium-vitamin D (OSCAL WITH D) 500-200 MG-UNIT per tablet Take 1 tablet by mouth daily.      Marland Kitchen glycopyrrolate (ROBINUL) 2 MG tablet Take 1 tablet (2 mg total) by mouth 2 (two) times daily. 60 tablet 1  . mesalamine (LIALDA) 1.2 G EC tablet Take 2 tablets by mouth every morning and 2 tablets by mouth every evening. 360 tablet 1  . olmesartan-hydrochlorothiazide (BENICAR HCT) 40-12.5 MG per tablet Take 1 tablet by mouth  daily 90 tablet 1  . ondansetron (ZOFRAN) 4 MG tablet Take 1 tablet (4 mg total) by mouth every 6 (six) hours as  needed for nausea or vomiting. 20 tablet 0  . potassium chloride SA (K-DUR,KLOR-CON) 20 MEQ tablet Take 1 tablet (20 mEq total) by mouth daily. 30 tablet 11  . predniSONE (DELTASONE) 10 MG tablet Take by mouth daily with breakfast. Take 30-68m by mouth once daily    . traMADol (ULTRAM) 50 MG tablet Take by mouth every 6 (six) hours as needed (Take 1/2 to 1 tablet by mouth every 6 hours as needed for pain).     No current facility-administered medications for this visit.   No Known Allergies   Review of Systems: All systems reviewed and negative except where noted in HPI.   No recent labs on file. TB testing negative in February. Normal Hgb 2 months ago.   CT abdomen July 8th - small bowel obstruction with focal transition point without evidence of active inflammation  Physical Exam: BP 116/70 mmHg  Pulse 76  Ht 5' 3.5" (1.613 m)  Wt 196 lb 6 oz (89.075 kg)  BMI 34.24 kg/m2 Constitutional: Pleasant,well-developed, AA female in no acute distress. HEENT: Normocephalic and atraumatic. Conjunctivae are normal. No scleral icterus. Neck supple.  Cardiovascular: Normal rate, regular rhythm.  Pulmonary/chest: Effort normal and breath sounds normal. No wheezing, rales or rhonchi. Abdominal: Soft, nondistended, mild lower abdominal TTP without rebound or guarding. Bowel sounds active throughout. There are no masses palpable. No hepatomegaly. Extremities: no edema Lymphadenopathy: No cervical adenopathy noted. Neurological: Alert and oriented to person place and time. Skin: Skin is warm and dry. No rashes noted. Psychiatric: Normal mood and affect. Behavior is normal.   ASSESSMENT AND PLAN: 59y/o female with longstanding predominantly left sided ulcerative colitis who has failed mesalamine monotherapy, thiopurine monotherapy, Humira monotherapy, and has had multiple courses of steroids throughout her life, with continued poor control of her UC. Most recent exam in May shows active colitis.  She has been evaluated for colectomy but wishes to avoid colectomy if at all possible. Unfortunately she has not been able to successfully wean off steroids without flare of symptoms. Interestingly she has been hospitalized with an SBO in recent months, thought to be due to adhesive disease although Crohns is also a possibility, but seems less likely.   I discussed medical options with her at this point in time. I would recommend starting Rowasa enemas in addition to her Lialda now to see if this will provide any further benefit. Otherwise, she seemed to have an initial good response to Humira, but ultimately developed immunogenicity with positive AB titers.  I do think she would have a good response to another anti-TNF agent given this history, although recommend combination immunosuppressive therapy if she goes back on an anti-TNF to prevent immunogenicity. I discussed Remicade, Simponi, Cimzia, and also discussed Entyvio which she has not been exposed to. I also disscussed thiopurines and methotrexate. Given her severe colitis my recommendation would be to start Remicade at 63m/kg and combine this with 6MP which she tolerated previously, dosed at 1-1.529mkg. Before we go through with this I will obtain baseline labs, TPMT assay, hepatitis screening, and C Diff testing to ensure normal/negative. She has a recent quantiferon gold which is negative. Pending these return okay, will plan on Remicade infusion in the near future and starting back on thiopurines as well. She will need blood work closely monitored resuming thiopurines.   I discussed the risks / benefits of anti-TNF therapy with her to include risks of infection, lymphoma, infusion reactions, drug induced SLE and other autoimmune phenomena, psoriasis, neurologic side effects, etc. I discussed risks of thiopurines to include risks of bone marrow suppression, hepatotoxicity, infection, lymphoma, etc. Once we start Remicade will plan on tapering off  prednisone by 102m42mer week.  She will need vaccinations updated, and will need DEXA at some point in the near future as well given her risk for osteoporosis with long term steroid use.  If she tests positive for C diff she will need therapy for this prior to starting Remicade. Finally, regarding her recent SBO would recommend an MRE to more definitively rule out Crohns disease and assess for small bowel involvement in light of her recent hospitalization for this issue.   All questions answered, we will contact her once results of studies obtained to move forward with initiation of this therapy.   SteCarolina CellarD [facility] Pager [phone]

## 2015-07-31 NOTE — Patient Instructions (Addendum)
We have sent medications to your pharmacy for you to pick up at your convenience.   You have been scheduled for an MRI at [facility] on 08/09/2015. Your appointment time is 8:00am. Please arrive 1 hour prior to your appointment time for registration purposes. Please make certain not to have anything to eat or drink 6 hours prior to your test. In addition, if you have any metal in your body, have a pacemaker or defibrillator, please be sure to let your ordering physician know. This test typically takes 45 minutes to 1 hour to complete.  Your physician has requested that you go to the basement for lab work before leaving today.

## 2015-08-01 ENCOUNTER — Ambulatory Visit (HOSPITAL_COMMUNITY): Payer: 59 | Attending: Cardiology

## 2015-08-01 ENCOUNTER — Other Ambulatory Visit (HOSPITAL_COMMUNITY): Payer: 59

## 2015-08-01 ENCOUNTER — Other Ambulatory Visit: Payer: Self-pay

## 2015-08-01 DIAGNOSIS — I10 Essential (primary) hypertension: Secondary | ICD-10-CM | POA: Diagnosis not present

## 2015-08-01 DIAGNOSIS — R0600 Dyspnea, unspecified: Secondary | ICD-10-CM

## 2015-08-01 DIAGNOSIS — I340 Nonrheumatic mitral (valve) insufficiency: Secondary | ICD-10-CM | POA: Insufficient documentation

## 2015-08-01 DIAGNOSIS — I517 Cardiomegaly: Secondary | ICD-10-CM | POA: Diagnosis not present

## 2015-08-01 LAB — CLOSTRIDIUM DIFFICILE BY PCR: CDIFFPCR: NEGATIVE

## 2015-08-05 ENCOUNTER — Encounter: Payer: Self-pay | Admitting: Nurse Practitioner

## 2015-08-05 ENCOUNTER — Telehealth: Payer: Self-pay | Admitting: *Deleted

## 2015-08-05 LAB — COMPREHENSIVE METABOLIC PANEL
ALBUMIN: 3.9 g/dL (ref 3.6–4.8)
ALT: 18 IU/L (ref 0–32)
AST: 13 IU/L (ref 0–40)
Albumin/Globulin Ratio: 1.4 (ref 1.1–2.5)
Alkaline Phosphatase: 69 IU/L (ref 39–117)
BILIRUBIN TOTAL: 0.4 mg/dL (ref 0.0–1.2)
BUN/Creatinine Ratio: 19 (ref 11–26)
BUN: 20 mg/dL (ref 8–27)
CHLORIDE: 101 mmol/L (ref 97–108)
CO2: 27 mmol/L (ref 18–29)
CREATININE: 1.04 mg/dL — AB (ref 0.57–1.00)
Calcium: 9.2 mg/dL (ref 8.7–10.3)
GFR calc non Af Amer: 59 mL/min/{1.73_m2} — ABNORMAL LOW (ref >59)
GFR, EST AFRICAN AMERICAN: 67 mL/min/{1.73_m2} (ref >59)
GLOBULIN, TOTAL: 2.8 g/dL (ref 1.5–4.5)
Glucose: 86 mg/dL (ref 65–99)
POTASSIUM: 3.3 mmol/L — AB (ref 3.5–5.2)
Sodium: 139 mmol/L (ref 134–144)
TOTAL PROTEIN: 6.7 g/dL (ref 6.0–8.5)

## 2015-08-05 LAB — CBC WITH DIFFERENTIAL/PLATELET
BASOS ABS: 0 10*3/uL (ref 0.0–0.2)
BASOS: 0 %
EOS (ABSOLUTE): 0.1 10*3/uL (ref 0.0–0.4)
EOS: 1 %
HEMATOCRIT: 36.5 % (ref 34.0–46.6)
HEMOGLOBIN: 11.8 g/dL (ref 11.1–15.9)
IMMATURE GRANS (ABS): 0 10*3/uL (ref 0.0–0.1)
Immature Granulocytes: 0 %
LYMPHS ABS: 3.5 10*3/uL — AB (ref 0.7–3.1)
LYMPHS: 36 %
MCH: 29.1 pg (ref 26.6–33.0)
MCHC: 32.3 g/dL (ref 31.5–35.7)
MCV: 90 fL (ref 79–97)
MONOCYTES: 4 %
Monocytes Absolute: 0.4 10*3/uL (ref 0.1–0.9)
NEUTROS ABS: 5.8 10*3/uL (ref 1.4–7.0)
Neutrophils: 59 %
Platelets: 330 10*3/uL (ref 150–379)
RBC: 4.05 x10E6/uL (ref 3.77–5.28)
RDW: 14.5 % (ref 12.3–15.4)
WBC: 9.7 10*3/uL (ref 3.4–10.8)

## 2015-08-05 LAB — THIOPURINE METABOLITES

## 2015-08-05 LAB — HEPATITIS B SURFACE ANTIGEN: HEP B S AG: NEGATIVE

## 2015-08-05 LAB — HEPATITIS C ANTIBODY

## 2015-08-05 LAB — C-REACTIVE PROTEIN: CRP: 2.1 mg/L (ref 0.0–4.9)

## 2015-08-05 LAB — SEDIMENTATION RATE

## 2015-08-05 NOTE — Telephone Encounter (Signed)
Estera Borquez  Received: Today    Manus Gunning, MD  Hulan Saas, RN           Hi Regina, yes I am awaiting her labs. She had CBC and a lot of other stuff drawn on the 21st but nothing is results. It seems like this is taking far too long. Any way we can check the status of labs. Once resulted I think she would be okay to start. Thanks   [facility] [employer] and they will fax labs. They have all results except sed rate. State that the [place] was too old to run this. Results on MD desk for review. Dr. Havery Moros, do you want patient to come back and have the sed rate redrawn?

## 2015-08-05 NOTE — Telephone Encounter (Signed)
Thanks for tracking down the labs. She is cleared to start Remicade with negative TB and hepatitis testing. No need to draw the ESR. Unfortunately I had ordered TPMT assay and the wrong lab was sent which tested for thiopurine metabolites. At her next availability, could you please order the TPMT enzyme assay as I plan to start her on 6MP in the future. Otherwise, can you help coordinate Remicade 78m/kg infusion per normal protocol (time 0, then in 2 weeks, then in a month, etc) and once on this she can taper off the prednisone by 574mper week. Thanks

## 2015-08-06 ENCOUNTER — Telehealth: Payer: Self-pay | Admitting: Cardiovascular Disease

## 2015-08-06 NOTE — Telephone Encounter (Signed)
F/u  Pt returning Michelle's phone call. Pt stated it would be best to call back after 3 pm. Please call back and discuss.

## 2015-08-06 NOTE — Telephone Encounter (Signed)
Echo results reviewed with patient who verbalized understanding.  Result routed to PCP per patient request.

## 2015-08-06 NOTE — Telephone Encounter (Signed)
Faxed Remicade orders to [facility] for benefit investigation.

## 2015-08-06 NOTE — Telephone Encounter (Signed)
Attempted to call patient; no answer, no voice mail

## 2015-08-08 ENCOUNTER — Telehealth: Payer: Self-pay | Admitting: Gastroenterology

## 2015-08-08 ENCOUNTER — Other Ambulatory Visit: Payer: Self-pay | Admitting: Gastroenterology

## 2015-08-08 NOTE — Telephone Encounter (Signed)
Patient is almost out of  Prednisone. She will call her pharmacy to send over a refill request.

## 2015-08-09 ENCOUNTER — Other Ambulatory Visit: Payer: Self-pay | Admitting: *Deleted

## 2015-08-09 ENCOUNTER — Ambulatory Visit (HOSPITAL_COMMUNITY)
Admission: RE | Admit: 2015-08-09 | Discharge: 2015-08-09 | Disposition: A | Discharge status: Home / Self Care | Payer: 59 | Source: Ambulatory Visit | Attending: Gastroenterology | Admitting: Gastroenterology

## 2015-08-09 ENCOUNTER — Ambulatory Visit (HOSPITAL_COMMUNITY): Admission: RE | Admit: 2015-08-09 | Payer: 59 | Source: Ambulatory Visit

## 2015-08-09 DIAGNOSIS — N289 Disorder of kidney and ureter, unspecified: Secondary | ICD-10-CM | POA: Diagnosis not present

## 2015-08-09 DIAGNOSIS — K7689 Other specified diseases of liver: Secondary | ICD-10-CM | POA: Diagnosis not present

## 2015-08-09 DIAGNOSIS — K51919 Ulcerative colitis, unspecified with unspecified complications: Secondary | ICD-10-CM

## 2015-08-09 DIAGNOSIS — K51219 Ulcerative (chronic) proctitis with unspecified complications: Secondary | ICD-10-CM

## 2015-08-09 DIAGNOSIS — K5190 Ulcerative colitis, unspecified, without complications: Secondary | ICD-10-CM | POA: Diagnosis not present

## 2015-08-09 MED ORDER — PREDNISONE 10 MG PO TABS: 10.0000 mg | ORAL_TABLET | Freq: Every day | ORAL | Status: DC

## 2015-08-09 MED ORDER — GADOBENATE DIMEGLUMINE 529 MG/ML IV SOLN
20.0000 mL | Freq: Once | INTRAVENOUS | Status: AC | PRN
  Administered 2015-08-09: 18 mL via INTRAVENOUS

## 2015-08-09 NOTE — Telephone Encounter (Signed)
Received a fax that Remicade is approved from [facility]. Scheduled Remicade infusion on 08/15/15 at 10:00 AM, 08/30/15 at 11:00 AM and 09/27/15 at 11:00 AM. Orders in EPIC.Left a message for patient to call back.

## 2015-08-09 NOTE — Telephone Encounter (Signed)
Patient notified of appointments. Refilled Prednisone.  She will come Monday for labs.

## 2015-08-12 ENCOUNTER — Other Ambulatory Visit: Payer: 59

## 2015-08-12 DIAGNOSIS — K51219 Ulcerative (chronic) proctitis with unspecified complications: Secondary | ICD-10-CM

## 2015-08-12 NOTE — Telephone Encounter (Signed)
This patient would like Prednisone refilled. Do you want her to have a taper? If so what's the recommended taper?

## 2015-08-12 NOTE — Telephone Encounter (Signed)
Has she started Remicade yet? If so, she should taper by 48m per week until off. If she has not started Remicade she should continue her present dose until she starts it. I believe she was taking 281mper day when she saw me last. Can you confirm with her and let her know the taper will start once she starts her Remicade. Thanks

## 2015-08-14 ENCOUNTER — Telehealth: Payer: Self-pay | Admitting: Gastroenterology

## 2015-08-14 NOTE — Telephone Encounter (Signed)
Spoke with [facility] again and gave them [place] # to call re: Remicade.

## 2015-08-14 NOTE — Telephone Encounter (Signed)
Latasha Wood can you call [facility] and answer the questions about this patient's remicade?

## 2015-08-14 NOTE — Telephone Encounter (Signed)
[facility] called requesting [facility] pharmacy to discuss Remicade shipping.

## 2015-08-15 ENCOUNTER — Telehealth: Payer: Self-pay | Admitting: *Deleted

## 2015-08-15 ENCOUNTER — Encounter (HOSPITAL_COMMUNITY): Admission: RE | Admit: 2015-08-15 | Payer: 59 | Source: Ambulatory Visit

## 2015-08-15 NOTE — Telephone Encounter (Signed)
Spoke with patient and she was called by "a company" and told her medication had not been shipped yet so she did not go today for Remicade. Patient given number of short stay to call to reschedule.

## 2015-08-15 NOTE — Telephone Encounter (Signed)
Left a message for patient to call back. 

## 2015-08-15 NOTE — Telephone Encounter (Signed)
-----   Message from Janae Bridgeman, RN sent at 08/15/2015  2:30 PM EDT ----- Regarding: No Call/No Show for his REMICADE appointment Contact: [phone] Hi Regian,  This patient did not show up or call to cancel his 0 Week REMICADE. Please let me know if you talk to him and have him call us to reschedule because it will change his future appointments for 10/21 and 11/18 of his induction. Thanks, Marcie Bal

## 2015-08-16 ENCOUNTER — Ambulatory Visit: Payer: 59 | Admitting: Gastroenterology

## 2015-08-20 LAB — THIOPURINE METHYLTRANSFERASE (TPMT), RBC: Thiopurine Methyltransferase, RBC: 10 nmol/hr/mL RBC — ABNORMAL LOW

## 2015-08-21 ENCOUNTER — Encounter (HOSPITAL_COMMUNITY): Admission: RE | Admit: 2015-08-21 | Payer: 59 | Source: Ambulatory Visit

## 2015-08-28 ENCOUNTER — Ambulatory Visit: Payer: 59 | Admitting: Gastroenterology

## 2015-08-29 ENCOUNTER — Ambulatory Visit: Payer: 59 | Admitting: Internal Medicine

## 2015-08-30 ENCOUNTER — Encounter (HOSPITAL_COMMUNITY): Payer: 59

## 2015-09-01 ENCOUNTER — Inpatient Hospital Stay (HOSPITAL_COMMUNITY)
Admission: EM | Admit: 2015-09-01 | Discharge: 2015-09-03 | DRG: 386 | Disposition: A | Discharge status: Home / Self Care | Payer: 59 | Attending: Internal Medicine | Admitting: Internal Medicine

## 2015-09-01 ENCOUNTER — Encounter (HOSPITAL_COMMUNITY): Payer: Self-pay | Admitting: Oncology

## 2015-09-01 ENCOUNTER — Emergency Department (HOSPITAL_COMMUNITY): Payer: 59

## 2015-09-01 DIAGNOSIS — Z7952 Long term (current) use of systemic steroids: Secondary | ICD-10-CM

## 2015-09-01 DIAGNOSIS — Z79899 Other long term (current) drug therapy: Secondary | ICD-10-CM | POA: Diagnosis not present

## 2015-09-01 DIAGNOSIS — Z8249 Family history of ischemic heart disease and other diseases of the circulatory system: Secondary | ICD-10-CM | POA: Diagnosis not present

## 2015-09-01 DIAGNOSIS — Z800 Family history of malignant neoplasm of digestive organs: Secondary | ICD-10-CM | POA: Diagnosis not present

## 2015-09-01 DIAGNOSIS — Z86010 Personal history of colonic polyps: Secondary | ICD-10-CM

## 2015-09-01 DIAGNOSIS — Z803 Family history of malignant neoplasm of breast: Secondary | ICD-10-CM | POA: Diagnosis not present

## 2015-09-01 DIAGNOSIS — Z7982 Long term (current) use of aspirin: Secondary | ICD-10-CM | POA: Diagnosis not present

## 2015-09-01 DIAGNOSIS — D72829 Elevated white blood cell count, unspecified: Secondary | ICD-10-CM | POA: Diagnosis present

## 2015-09-01 DIAGNOSIS — I10 Essential (primary) hypertension: Secondary | ICD-10-CM

## 2015-09-01 DIAGNOSIS — E876 Hypokalemia: Secondary | ICD-10-CM | POA: Diagnosis present

## 2015-09-01 DIAGNOSIS — K51912 Ulcerative colitis, unspecified with intestinal obstruction: Principal | ICD-10-CM

## 2015-09-01 DIAGNOSIS — K56609 Unspecified intestinal obstruction, unspecified as to partial versus complete obstruction: Secondary | ICD-10-CM | POA: Diagnosis present

## 2015-09-01 DIAGNOSIS — Z90710 Acquired absence of both cervix and uterus: Secondary | ICD-10-CM

## 2015-09-01 DIAGNOSIS — F419 Anxiety disorder, unspecified: Secondary | ICD-10-CM | POA: Diagnosis present

## 2015-09-01 DIAGNOSIS — E785 Hyperlipidemia, unspecified: Secondary | ICD-10-CM | POA: Diagnosis present

## 2015-09-01 DIAGNOSIS — R112 Nausea with vomiting, unspecified: Secondary | ICD-10-CM | POA: Diagnosis present

## 2015-09-01 DIAGNOSIS — K5669 Other intestinal obstruction: Secondary | ICD-10-CM | POA: Diagnosis not present

## 2015-09-01 DIAGNOSIS — T380X5A Adverse effect of glucocorticoids and synthetic analogues, initial encounter: Secondary | ICD-10-CM | POA: Diagnosis present

## 2015-09-01 DIAGNOSIS — K51012 Ulcerative (chronic) pancolitis with intestinal obstruction: Secondary | ICD-10-CM

## 2015-09-01 DIAGNOSIS — E781 Pure hyperglyceridemia: Secondary | ICD-10-CM | POA: Diagnosis present

## 2015-09-01 DIAGNOSIS — Z833 Family history of diabetes mellitus: Secondary | ICD-10-CM

## 2015-09-01 LAB — URINALYSIS, ROUTINE W REFLEX MICROSCOPIC
BILIRUBIN URINE: NEGATIVE
Glucose, UA: NEGATIVE mg/dL
HGB URINE DIPSTICK: NEGATIVE
KETONES UR: NEGATIVE mg/dL
Leukocytes, UA: NEGATIVE
Nitrite: NEGATIVE
Protein, ur: NEGATIVE mg/dL
SPECIFIC GRAVITY, URINE: 1.03 (ref 1.005–1.030)
UROBILINOGEN UA: 0.2 mg/dL (ref 0.0–1.0)
pH: 7 (ref 5.0–8.0)

## 2015-09-01 LAB — CBC WITH DIFFERENTIAL/PLATELET
BASOS ABS: 0 10*3/uL (ref 0.0–0.1)
BASOS PCT: 0 %
EOS ABS: 0 10*3/uL (ref 0.0–0.7)
EOS PCT: 0 %
HCT: 37.9 % (ref 36.0–46.0)
HEMOGLOBIN: 12.6 g/dL (ref 12.0–15.0)
LYMPHS ABS: 2.9 10*3/uL (ref 0.7–4.0)
Lymphocytes Relative: 23 %
MCH: 29.4 pg (ref 26.0–34.0)
MCHC: 33.2 g/dL (ref 30.0–36.0)
MCV: 88.6 fL (ref 78.0–100.0)
Monocytes Absolute: 0.7 10*3/uL (ref 0.1–1.0)
Monocytes Relative: 5 %
NEUTROS PCT: 72 %
Neutro Abs: 9.1 10*3/uL — ABNORMAL HIGH (ref 1.7–7.7)
PLATELETS: 292 10*3/uL (ref 150–400)
RBC: 4.28 MIL/uL (ref 3.87–5.11)
RDW: 13.5 % (ref 11.5–15.5)
WBC: 12.7 10*3/uL — AB (ref 4.0–10.5)

## 2015-09-01 LAB — CBC
HEMATOCRIT: 39.8 % (ref 36.0–46.0)
Hemoglobin: 13.1 g/dL (ref 12.0–15.0)
MCH: 29.7 pg (ref 26.0–34.0)
MCHC: 32.9 g/dL (ref 30.0–36.0)
MCV: 90.2 fL (ref 78.0–100.0)
PLATELETS: 364 10*3/uL (ref 150–400)
RBC: 4.41 MIL/uL (ref 3.87–5.11)
RDW: 13.4 % (ref 11.5–15.5)
WBC: 14.3 10*3/uL — ABNORMAL HIGH (ref 4.0–10.5)

## 2015-09-01 LAB — COMPREHENSIVE METABOLIC PANEL
ALBUMIN: 4.1 g/dL (ref 3.5–5.0)
ALK PHOS: 78 U/L (ref 38–126)
ALT: 21 U/L (ref 14–54)
AST: 26 U/L (ref 15–41)
Anion gap: 9 (ref 5–15)
BILIRUBIN TOTAL: 0.6 mg/dL (ref 0.3–1.2)
BUN: 19 mg/dL (ref 6–20)
CALCIUM: 9.5 mg/dL (ref 8.9–10.3)
CO2: 29 mmol/L (ref 22–32)
CREATININE: 0.96 mg/dL (ref 0.44–1.00)
Chloride: 101 mmol/L (ref 101–111)
GFR calc Af Amer: >60 mL/min (ref >60)
GFR calc non Af Amer: >60 mL/min (ref >60)
GLUCOSE: 102 mg/dL — AB (ref 65–99)
POTASSIUM: 3.6 mmol/L (ref 3.5–5.1)
Sodium: 139 mmol/L (ref 135–145)
TOTAL PROTEIN: 7.9 g/dL (ref 6.5–8.1)

## 2015-09-01 LAB — BASIC METABOLIC PANEL
ANION GAP: 8 (ref 5–15)
BUN: 18 mg/dL (ref 6–20)
CALCIUM: 8.9 mg/dL (ref 8.9–10.3)
CO2: 29 mmol/L (ref 22–32)
CREATININE: 0.96 mg/dL (ref 0.44–1.00)
Chloride: 103 mmol/L (ref 101–111)
Glucose, Bld: 89 mg/dL (ref 65–99)
Potassium: 3.2 mmol/L — ABNORMAL LOW (ref 3.5–5.1)
Sodium: 140 mmol/L (ref 135–145)

## 2015-09-01 LAB — CORTISOL-AM, BLOOD: CORTISOL - AM: 40.2 ug/dL — AB (ref 6.7–22.6)

## 2015-09-01 LAB — PROTIME-INR
INR: 0.9 (ref 0.00–1.49)
PROTHROMBIN TIME: 12.4 s (ref 11.6–15.2)

## 2015-09-01 LAB — APTT: APTT: 23 s — AB (ref 24–37)

## 2015-09-01 LAB — LACTIC ACID, PLASMA
LACTIC ACID, VENOUS: 1.4 mmol/L (ref 0.5–2.0)
Lactic Acid, Venous: 1 mmol/L (ref 0.5–2.0)

## 2015-09-01 LAB — LIPASE, BLOOD: Lipase: 23 U/L (ref 11–51)

## 2015-09-01 LAB — GLUCOSE, CAPILLARY: GLUCOSE-CAPILLARY: 108 mg/dL — AB (ref 65–99)

## 2015-09-01 MED ORDER — ONDANSETRON HCL 4 MG/2ML IJ SOLN
4.0000 mg | Freq: Once | INTRAMUSCULAR | Status: AC
  Administered 2015-09-01: 4 mg via INTRAVENOUS
  Filled 2015-09-01: qty 2

## 2015-09-01 MED ORDER — OLMESARTAN MEDOXOMIL-HCTZ 40-12.5 MG PO TABS: 1.0000 | ORAL_TABLET | Freq: Every day | ORAL | Status: DC

## 2015-09-01 MED ORDER — IRBESARTAN 300 MG PO TABS
300.0000 mg | ORAL_TABLET | Freq: Every day | ORAL | Status: DC
  Administered 2015-09-02 – 2015-09-03 (×2): 300 mg via ORAL
  Filled 2015-09-01 (×3): qty 1

## 2015-09-01 MED ORDER — LIP MEDEX EX OINT
TOPICAL_OINTMENT | CUTANEOUS | Status: AC
  Administered 2015-09-01: 1
  Filled 2015-09-01: qty 7

## 2015-09-01 MED ORDER — MESALAMINE 1.2 G PO TBEC
2.4000 g | DELAYED_RELEASE_TABLET | Freq: Two times a day (BID) | ORAL | Status: DC
  Administered 2015-09-02 – 2015-09-03 (×2): 2.4 g via ORAL
  Filled 2015-09-01 (×7): qty 2

## 2015-09-01 MED ORDER — ADULT MULTIVITAMIN W/MINERALS CH
1.0000 | ORAL_TABLET | Freq: Every day | ORAL | Status: DC
  Filled 2015-09-01 (×2): qty 1

## 2015-09-01 MED ORDER — PREDNISONE 10 MG PO TABS
15.0000 mg | ORAL_TABLET | Freq: Every day | ORAL | Status: DC
  Administered 2015-09-02 – 2015-09-03 (×2): 15 mg via ORAL
  Filled 2015-09-01 (×4): qty 1

## 2015-09-01 MED ORDER — ASPIRIN 81 MG PO CHEW
81.0000 mg | CHEWABLE_TABLET | Freq: Every day | ORAL | Status: DC | PRN
  Filled 2015-09-01: qty 1

## 2015-09-01 MED ORDER — MORPHINE SULFATE (PF) 4 MG/ML IV SOLN
4.0000 mg | Freq: Once | INTRAVENOUS | Status: AC
  Administered 2015-09-01: 4 mg via INTRAVENOUS
  Filled 2015-09-01: qty 1

## 2015-09-01 MED ORDER — CETYLPYRIDINIUM CHLORIDE 0.05 % MT LIQD
7.0000 mL | Freq: Two times a day (BID) | OROMUCOSAL | Status: DC
  Administered 2015-09-01 – 2015-09-03 (×5): 7 mL via OROMUCOSAL

## 2015-09-01 MED ORDER — MORPHINE SULFATE (PF) 2 MG/ML IV SOLN
2.0000 mg | INTRAVENOUS | Status: DC | PRN
  Administered 2015-09-01 (×2): 2 mg via INTRAVENOUS
  Filled 2015-09-01 (×2): qty 1

## 2015-09-01 MED ORDER — HYDROCORTISONE NA SUCCINATE PF 100 MG IJ SOLR
50.0000 mg | Freq: Once | INTRAMUSCULAR | Status: AC
  Administered 2015-09-01: 50 mg via INTRAVENOUS
  Filled 2015-09-01: qty 1

## 2015-09-01 MED ORDER — DIAZEPAM 5 MG/ML IJ SOLN
2.5000 mg | Freq: Once | INTRAMUSCULAR | Status: AC
Start: 2015-09-01 — End: 2015-09-01
  Administered 2015-09-01: 2.5 mg via INTRAVENOUS
  Filled 2015-09-01: qty 2

## 2015-09-01 MED ORDER — SODIUM CHLORIDE 0.9 % IV SOLN
INTRAVENOUS | Status: DC
  Administered 2015-09-01: 18:00:00 via INTRAVENOUS
  Administered 2015-09-01: 75 mL/h via INTRAVENOUS

## 2015-09-01 MED ORDER — ACETAMINOPHEN 650 MG RE SUPP
650.0000 mg | Freq: Four times a day (QID) | RECTAL | Status: DC | PRN
  Administered 2015-09-01 (×2): 650 mg via RECTAL
  Filled 2015-09-01 (×2): qty 1

## 2015-09-01 MED ORDER — ONDANSETRON HCL 4 MG/2ML IJ SOLN: 4.0000 mg | Freq: Three times a day (TID) | INTRAMUSCULAR | Status: DC | PRN

## 2015-09-01 MED ORDER — ALPRAZOLAM 0.25 MG PO TABS: 0.2500 mg | ORAL_TABLET | Freq: Two times a day (BID) | ORAL | Status: DC | PRN

## 2015-09-01 MED ORDER — HYDROCHLOROTHIAZIDE 12.5 MG PO CAPS
12.5000 mg | ORAL_CAPSULE | Freq: Every day | ORAL | Status: DC
  Administered 2015-09-02 – 2015-09-03 (×2): 12.5 mg via ORAL
  Filled 2015-09-01 (×3): qty 1

## 2015-09-01 MED ORDER — ACETAMINOPHEN 325 MG PO TABS: 650.0000 mg | ORAL_TABLET | Freq: Four times a day (QID) | ORAL | Status: DC | PRN

## 2015-09-01 MED ORDER — CALCIUM CARBONATE-VITAMIN D 500-200 MG-UNIT PO TABS
1.0000 | ORAL_TABLET | Freq: Every day | ORAL | Status: DC
  Administered 2015-09-03: 1 via ORAL
  Filled 2015-09-01 (×3): qty 1

## 2015-09-01 NOTE — Evaluation (Signed)
Physical Therapy Evaluation Patient Details Name: Latasha Wood MRN: [id number] DOB: 22-Nov-1955 Today's Date: 09/01/2015   History of Present Illness  59 yo female adm 09/01/15   who presents with nausea, vomiting, abdominal pain, schedule for surgery with Dr. Marcello Moores on 09/25/15    PMH of ulcerativ Colitis, hypertension, hyperlipidemia, small bowel obstruction, anxiety, hyperlipidemia  Clinical Impression  Patient evaluated by Physical Therapy with no further acute PT needs identified. All education has been completed and the patient has no further questions.  See below for any follow-up Physial Therapy or equipment needs. PT is signing off. Thank you for this referral.     Follow Up Recommendations No PT follow up    Equipment Recommendations  None recommended by PT    Recommendations for Other Services       Precautions / Restrictions Precautions Precaution Comments: NGT      Mobility  Bed Mobility Overal bed mobility: Modified Independent                Transfers Overall transfer level: Modified independent                  Ambulation/Gait Ambulation/Gait assistance: Supervision Ambulation Distance (Feet):  (10' more) Assistive device: None Gait Pattern/deviations: WFL(Within Functional Limits)     General Gait Details: supervision for safety, no LOB  Stairs            Wheelchair Mobility    Modified Rankin (Stroke Patients Only)       Balance Overall balance assessment: No apparent balance deficits (not formally assessed)                                           Pertinent Vitals/Pain Pain Assessment: No/denies pain    Home Living Family/patient expects to be discharged to:: Private residence Living Arrangements: Spouse/significant other   Type of Home: House       Home Layout: One level Home Equipment: None      Prior Function Level of Independence: Independent               Hand Dominance         Extremity/Trunk Assessment   Upper Extremity Assessment: Overall WFL for tasks assessed           Lower Extremity Assessment: Overall WFL for tasks assessed         Communication   Communication: No difficulties  Cognition Arousal/Alertness: Awake/alert Behavior During Therapy: WFL for tasks assessed/performed Overall Cognitive Status: Within Functional Limits for tasks assessed                      General Comments      Exercises        Assessment/Plan    PT Assessment Patent does not need any further PT services  PT Diagnosis Difficulty walking   PT Problem List    PT Treatment Interventions     PT Goals (Current goals can be found in the Care Plan section) Acute Rehab PT Goals PT Goal Formulation: All assessment and education complete, DC therapy    Frequency     Barriers to discharge        Co-evaluation               End of Session   Activity Tolerance: Patient tolerated treatment well Patient left: in chair;with call bell/phone within  reach;with family/visitor present           Time: 1002-1020 PT Time Calculation (min) (ACUTE ONLY): 18 min   Charges:   PT Evaluation $Initial PT Evaluation Tier I: 1 Procedure     PT G Codes:        Baylin Cabal 09-23-15, 10:25 AM

## 2015-09-01 NOTE — ED Notes (Signed)
Nurse at beside collecting labs

## 2015-09-01 NOTE — ED Provider Notes (Signed)
CSN: [id number]     Arrival date & time 09/01/15  0016 History  By signing my name below, I, Hansel Feinstein, attest that this documentation has been prepared under the direction and in the presence of Merryl Hacker, MD. Electronically Signed: Hansel Feinstein, ED Scribe. 09/01/2015. 12:52 AM.    Chief Complaint  Patient presents with  . Abdominal Pain   The history is provided by the patient. No language interpreter was used.    HPI Comments: Latasha Wood is a 59 y.o. female with h/o HTN, ulcerative colitis, polyps, HLD, anemia who presents to the Emergency Department complaining of moderate, worsening epigastric 10/10 abdominal pain and cramping onset yesterday with associated nausea, emesis. Pt notes that she did find relief with ambulation until recently. She also states that emesis temporarily relieves the pain. She states her symptoms are similar to prior bowel obstructions. Last bowel movement (small) was just PTA. Pt is scheduled for surgery on OZH.08 with Leighton Ruff for colectomy. She denies bloody stools or hematemesis.   Past Medical History  Diagnosis Date  . Hypertension   . Colitis, ulcerative ([place])   . Hyperlipidemia   . Colon polyps   . Blood transfusion   . Anemia    Past Surgical History  Procedure Laterality Date  . Gravida 6 para 2      all SAB  . Hyadiform mole    . Dilation and curettage of uterus      most likely after miscarriage  . Knee arthroscopy w/ meniscal repair      right knee ([place])  . Fibrocystic breast disease      q 6 month mammogram  . Abdominal hysterectomy  2000   Family History  Problem Relation Age of Onset  . Diabetes Mother   . Breast cancer Mother 68  . Diabetes Father   . Heart disease Father   . Diabetes Sister   . Colon cancer Paternal Aunt   . Diabetes Brother   . Breast cancer Sister 44   Social History  Substance Use Topics  . Smoking status: Never Smoker   . Smokeless tobacco: Never Used  . Alcohol Use: 1.0  oz/week    2 Standard drinks or equivalent per week     Comment: occ   OB History    No data available     Review of Systems  Constitutional: Negative for fever.  Respiratory: Negative for chest tightness and shortness of breath.   Gastrointestinal: Positive for nausea, vomiting and abdominal pain. Negative for blood in stool.  Genitourinary: Negative for difficulty urinating.  All other systems reviewed and are negative.  Allergies  Review of patient's allergies indicates no known allergies.  Home Medications   Prior to Admission medications   Medication Sig Start Date End Date Taking? Authorizing Provider  ALPRAZolam (XANAX) 0.25 MG tablet Take 1 tablet (0.25 mg total) by mouth 2 (two) times daily as needed for anxiety. 03/04/15  Yes Amy S Esterwood, PA-C  aspirin 81 MG chewable tablet Chew 81 mg by mouth daily as needed (been taking 81 mg aspirin for high blood pressure treatment the past couple of days).   Yes Historical Provider, MD  calcium-vitamin D (OSCAL WITH D) 500-200 MG-UNIT per tablet Take 1 tablet by mouth daily.     Yes Historical Provider, MD  mesalamine (LIALDA) 1.2 G EC tablet Take 2 tablets by mouth every morning and 2 tablets by mouth every evening. 07/31/14  Yes Lafayette Dragon, MD  Multiple  Vitamin (MULTIVITAMIN WITH MINERALS) TABS tablet Take 1 tablet by mouth daily.   Yes Historical Provider, MD  olmesartan-hydrochlorothiazide (BENICAR HCT) 40-12.5 MG per tablet Take 1 tablet by mouth  daily 05/28/15  Yes Janith Lima, MD  potassium chloride SA (K-DUR,KLOR-CON) 20 MEQ tablet Take 1 tablet (20 mEq total) by mouth daily. 05/28/15  Yes Janith Lima, MD  predniSONE (DELTASONE) 10 MG tablet Take 1 tablet (10 mg total) by mouth daily with breakfast. Take 30-34m by mouth once daily Patient taking differently: Take 15 mg by mouth daily with breakfast. Take 30-431mby mouth once daily 08/09/15  Yes StManus GunningMD  glycopyrrolate (ROBINUL) 2 MG tablet Take 1  tablet (2 mg total) by mouth 2 (two) times daily. Patient not taking: Reported on 09/01/2015 03/04/15   Amy S Esterwood, PA-C  mesalamine (ROWASA) 4 G enema Place 60 mLs (4 g total) rectally at bedtime. Patient not taking: Reported on 09/01/2015 07/31/15   StManus GunningMD   BP 149/74 mmHg  Pulse 74  Temp(Src) 98.6 F (37 C) (Oral)  Resp 18  Ht 5' 4"  (1.626 m)  Wt 196 lb (88.905 kg)  BMI 33.63 kg/m2  SpO2 99% Physical Exam  Constitutional: She is oriented to person, place, and time. No distress.  Uncomfortable appearing  HENT:  Head: Normocephalic and atraumatic.  Eyes: Pupils are equal, round, and reactive to light.  Cardiovascular: Normal rate, regular rhythm and normal heart sounds.   No murmur heard. Pulmonary/Chest: Effort normal and breath sounds normal. No respiratory distress. She has no wheezes.  Abdominal: Soft.  Scant bowel sounds, diffuse tenderness to palpation without rebound or guarding  Musculoskeletal: She exhibits no edema.  Neurological: She is alert and oriented to person, place, and time.  Skin: Skin is warm and dry.  Psychiatric: She has a normal mood and affect.  Nursing note and vitals reviewed.   ED Course  Procedures (including critical care time)  Angiocath insertion Performed by: HOMerryl HackerConsent: Verbal consent obtained. Risks and benefits: risks, benefits and alternatives were discussed Time out: Immediately prior to procedure a "time out" was called to verify the correct patient, procedure, equipment, support staff and site/side marked as required.  Preparation: Patient was prepped and draped in the usual sterile fashion.  Vein Location: left basilic  Ultrasound Guided  Gauge: 20  Normal blood return and flush without difficulty Patient tolerance: Patient tolerated the procedure well with no immediate complications.    DIAGNOSTIC STUDIES: Oxygen Saturation is 100% on RA, normal by my interpretation.     COORDINATION OF CARE: 12:50 AM Discussed treatment plan with pt at bedside and pt agreed to plan.   Labs Review Labs Reviewed  COMPREHENSIVE METABOLIC PANEL - Abnormal; Notable for the following:    Glucose, Bld 102 (*)    All other components within normal limits  CBC - Abnormal; Notable for the following:    WBC 14.3 (*)    All other components within normal limits  URINALYSIS, ROUTINE W REFLEX MICROSCOPIC ([facility]- Abnormal; Notable for the following:    APPearance CLOUDY (*)    All other components within normal limits  LIPASE, BLOOD  LACTIC ACID, PLASMA  LACTIC ACID, PLASMA    Imaging Review Dg Abd Acute W/chest  09/01/2015  CLINICAL DATA:  Nausea, vomiting, and abdominal pain since yesterday. History of bowel obstructions. EXAM: DG ABDOMEN ACUTE W/ 1V CHEST COMPARISON:  CT and radiographs 05/17/2015 FINDINGS: The cardiomediastinal contours  are normal. The lungs are clear. There is no free intra-abdominal air. Small air-fluid level in the stomach. Probable fluid-filled small bowel loops in the left abdomen measuring up to 3.6 cm, no air-filled dilated bowel loops are seen. Small volume of stool throughout the colon. No radiopaque calculi. No acute osseous abnormalities are seen. IMPRESSION: Suspect fluid-filled dilated bowel loops in the left abdomen, given history may reflect early small bowel obstruction. Electronically Signed   By: Jeb Levering M.D.   On: 09/01/2015 01:52   I have personally reviewed and evaluated these images and lab results as part of my medical decision-making.  MDM   Final diagnoses:  SBO (small bowel obstruction) ([place])    Patient presents with symptoms consistent with prior small bowel obstructions.  Uncomfortable appearing but nontoxic. Vomiting. Vital signs are reassuring. Without signs of peritonitis on exam. Patient given pain meds fluids and antinausea medication. Lactate normal.  Other workup reassuring. Mild leukocytosis. Acute  abdominal series with fluid levels suggestive of early SBO. Discussed with Dr. Marlou Starks, general surgery. Will admit to the medicine service for medical management. NG tube placed. Discussed with Dr. Blaine Hamper.  I personally performed the services described in this documentation, which was scribed in my presence. The recorded information has been reviewed and is accurate.   Merryl Hacker, MD 09/01/15 [phone]

## 2015-09-01 NOTE — Progress Notes (Signed)
Re:  Rapid onset headache.  Pt requested IV pain medicine (which was given) for abdominal cramping-type pain after she had been up in chair and returned to bed.  Within an hour, family called nurse to room saying pt had just developed swelling on R side of head and neck.  RN examined pt and did not observe difference between L and R sides.  No swelling palpated on L or R of neck or head.  Pt said she noted that her head started hurting since morphine and she requested Tylenol.  Family requested MD be called for pain management of headache.  RN noted orders for PRN Tylenol suppository and pt agreeable to this; Tylenol given and ice pack applied to R side of head/neck.  Discussed plan to try Tylenol and contact MD if Tylenol ineffective.  Pt agreeable to plan. Pt reported acceptable relief from Tylenol.

## 2015-09-01 NOTE — H&P (Signed)
[facility] History and Physical  Latasha Wood:[id number] DOB: 05-Mar-1956 DOA: 09/01/2015  Referring physician: ED physician PCP: Scarlette Calico, MD  Specialists:   Chief Complaint: Nausea, vomiting, abdominal pain  HPI: Latasha Wood is a 59 y.o. female with PMH of ulcerativ Colitis, hypertension, hyperlipidemia, small bowel obstruction, anxiety, hyperlipidemia, who presents with nausea, vomiting, abdominal pain.  Patient reports that she started having nausea, vomiting, abdominal pain since yesterday. She vomited twice today without blood in the vomitus. She does not have fever, chills, diarrhea. She had bowel movement with small amount of stool at 10 PM today.  She does not have chest pain, shortness of breath, cough, symptoms of a UTI, unilateral weakness.  In ED, patient was found to have leukocytosis with WBC 14.3, temperature normal, no tachycardia. Electrolytes okay. Lipase 23. Acute abdomen and chest x-ray showed possible early stage of small bowel obstruction. Patient is admitted to inpatient for further intervention and treatment with general surgeon was consulted by ED.  Where does patient live?   At home  Can patient participate in ADLs?  Yes  Review of Systems:   General: no fevers, chills, no changes in body weight, has poor appetite, has fatigue HEENT: no blurry vision, hearing changes or sore throat Pulm: no dyspnea, coughing, wheezing CV: no chest pain, palpitations Abd: has nausea, vomiting, abdominal pain, no diarrhea, constipation GU: no dysuria, burning on urination, increased urinary frequency, hematuria  Ext: no leg edema Neuro: no unilateral weakness, numbness, or tingling, no vision change or hearing loss Skin: no rash MSK: No muscle spasm, no deformity, no limitation of range of movement in spin Heme: No easy bruising.  Travel history: No recent long distant travel.  Allergy: No Known Allergies  Past Medical History  Diagnosis Date   . Hypertension   . Colitis, ulcerative ([place])   . Hyperlipidemia   . Colon polyps   . Blood transfusion   . Anemia     Past Surgical History  Procedure Laterality Date  . Gravida 6 para 2      all SAB  . Hyadiform mole    . Dilation and curettage of uterus      most likely after miscarriage  . Knee arthroscopy w/ meniscal repair      right knee ([place])  . Fibrocystic breast disease      q 6 month mammogram  . Abdominal hysterectomy  2000  . Small bowel obstruction      Social History:  reports that she has never smoked. She has never used smokeless tobacco. She reports that she drinks about 1.0 oz of alcohol per week. She reports that she does not use illicit drugs.  Family History:  Family History  Problem Relation Age of Onset  . Diabetes Mother   . Breast cancer Mother 47  . Diabetes Father   . Heart disease Father   . Diabetes Sister   . Colon cancer Paternal Aunt   . Diabetes Brother   . Breast cancer Sister 84     Prior to Admission medications   Medication Sig Start Date End Date Taking? Authorizing Provider  ALPRAZolam (XANAX) 0.25 MG tablet Take 1 tablet (0.25 mg total) by mouth 2 (two) times daily as needed for anxiety. 03/04/15  Yes Amy S Esterwood, PA-C  aspirin 81 MG chewable tablet Chew 81 mg by mouth daily as needed (been taking 81 mg aspirin for high blood pressure treatment the past couple of days).   Yes Historical Provider, MD  calcium-vitamin D (OSCAL WITH D) 500-200 MG-UNIT per tablet Take 1 tablet by mouth daily.     Yes Historical Provider, MD  mesalamine (LIALDA) 1.2 G EC tablet Take 2 tablets by mouth every morning and 2 tablets by mouth every evening. 07/31/14  Yes Lafayette Dragon, MD  Multiple Vitamin (MULTIVITAMIN WITH MINERALS) TABS tablet Take 1 tablet by mouth daily.   Yes Historical Provider, MD  olmesartan-hydrochlorothiazide (BENICAR HCT) 40-12.5 MG per tablet Take 1 tablet by mouth  daily 05/28/15  Yes Janith Lima, MD  potassium  chloride SA (K-DUR,KLOR-CON) 20 MEQ tablet Take 1 tablet (20 mEq total) by mouth daily. 05/28/15  Yes Janith Lima, MD  predniSONE (DELTASONE) 10 MG tablet Take 1 tablet (10 mg total) by mouth daily with breakfast. Take 30-71m by mouth once daily Patient taking differently: Take 15 mg by mouth daily with breakfast. Take 30-433mby mouth once daily 08/09/15  Yes StManus GunningMD  glycopyrrolate (ROBINUL) 2 MG tablet Take 1 tablet (2 mg total) by mouth 2 (two) times daily. Patient not taking: Reported on 09/01/2015 03/04/15   Amy S Esterwood, PA-C  mesalamine (ROWASA) 4 G enema Place 60 mLs (4 g total) rectally at bedtime. Patient not taking: Reported on 09/01/2015 07/31/15   StManus GunningMD    Physical Exam: Filed Vitals:   09/01/15 0024 09/01/15 0240  BP: 170/98 149/74  Pulse: 78 74  Temp: 98.6 F (37 C)   TempSrc: Oral   Resp: 20 18  Height: 5' 4"  (1.626 m)   Weight: 88.905 kg (196 lb)   SpO2: 100% 99%   General: Not in acute distress HEENT:       Eyes: PERRL, EOMI, no scleral icterus.       ENT: No discharge from the ears and nose, no pharynx injection, no tonsillar enlargement.        Neck: No JVD, no bruit, no mass felt. Heme: No neck lymph node enlargement. Cardiac: S1/S2, RRR, No murmurs, No gallops or rubs. Pulm:  No rales, wheezing, rhonchi or rubs. Abd: Soft, distended, diffusely tender, no rebound pain, no organomegaly, BS present. Ext: No pitting leg edema bilaterally. 2+DP/PT pulse bilaterally. Musculoskeletal: No joint deformities, No joint redness or warmth, no limitation of ROM in spin. Skin: No rashes.  Neuro: Alert, oriented X3, cranial nerves II-XII grossly intact, muscle strength 5/5 in all extremities, sensation to light touch intact.  Psych: Patient is not psychotic, no suicidal or hemocidal ideation.  Labs on Admission:  Basic Metabolic Panel:  Recent Labs Lab 09/01/15 0041  NA 139  K 3.6  CL 101  CO2 29  GLUCOSE 102*  BUN 19   CREATININE 0.96  CALCIUM 9.5   Liver Function Tests:  Recent Labs Lab 09/01/15 0041  AST 26  ALT 21  ALKPHOS 78  BILITOT 0.6  PROT 7.9  ALBUMIN 4.1    Recent Labs Lab 09/01/15 0041  LIPASE 23   No results for input(s): AMMONIA in the last 168 hours. CBC:  Recent Labs Lab 09/01/15 0041  WBC 14.3*  HGB 13.1  HCT 39.8  MCV 90.2  PLT 364   Cardiac Enzymes: No results for input(s): CKTOTAL, CKMB, CKMBINDEX, TROPONINI in the last 168 hours.  BNP (last 3 results) No results for input(s): BNP in the last 8760 hours.  ProBNP (last 3 results) No results for input(s): PROBNP in the last 8760 hours.  CBG: No results for input(s): GLUCAP in the last 168 hours.  Radiological Exams on Admission: Dg Abd Acute W/chest  09/01/2015  CLINICAL DATA:  Nausea, vomiting, and abdominal pain since yesterday. History of bowel obstructions. EXAM: DG ABDOMEN ACUTE W/ 1V CHEST COMPARISON:  CT and radiographs 05/17/2015 FINDINGS: The cardiomediastinal contours are normal. The lungs are clear. There is no free intra-abdominal air. Small air-fluid level in the stomach. Probable fluid-filled small bowel loops in the left abdomen measuring up to 3.6 cm, no air-filled dilated bowel loops are seen. Small volume of stool throughout the colon. No radiopaque calculi. No acute osseous abnormalities are seen. IMPRESSION: Suspect fluid-filled dilated bowel loops in the left abdomen, given history may reflect early small bowel obstruction. Electronically Signed   By: Jeb Levering M.D.   On: 09/01/2015 01:52    EKG:  Not done in ED, will get one.   Assessment/Plan Principal Problem:   SBO (small bowel obstruction) (H[facility]) Active Problems:   Essential hypertension   Ulcerative colitis with intestinal obstruction ([facility])   Leukocytosis   HLD (hyperlipidemia)   Anxiety  SBO (small bowel obstruction) ([place]): As evidence by X-ray of acute abdomen/chest. This is most likely due to ulcerative colitis.  Patient has leukocytosis which seems to be a chronic issue. She is not septic on admission. Hemodynamically stable. General surgery was consulted by ED.  -Admit to med surg bed -NPO -prn NG tube (not started yet) -morphine prn pain -Prn Zofran prn nausea  -IVF: NS 75 cc/h -INR/PTT/type & screen -Follow-up general surgeons recommendation  Essential hypertension: -continue benicar  Ulcerative colitis with intestinal obstruction ([place]): on mesalamine and prednisone at home. She states that she is scheduled for colectomy on 09/25/59 by Dr. Grandville Silos -continue home prednisone and mesalamine -Stress dose of Solu Cortef 50 mg x 1  -Check cortisol level  Leukocytosis: this seems to be a chronic issue, likely due to chronic inflammation secondary to ulcerative colitis.  -check cbc with differential in AM  Anxiety: Stable, no suicidal or homicidal ideations. -Continue home medications: Xanax when necessary  DVT ppx: SCD  Code Status: Full code Family Communication:   Yes, patient's husband at bed side Disposition Plan: Admit to inpatient   Date of Service 09/01/2015    Ivor Costa [facility] Pager [phone]  If 7PM-7AM, please contact night-coverage [email or web address] Password [email or web address] 09/01/2015, 3:07 AM

## 2015-09-01 NOTE — Consult Note (Addendum)
Chief Complaint:  Cramping abdominal pain and no flatus  History of Present Illness:  Latasha Wood is an 59 y.o. female who developed abdominal pain and cramps and tried to manage this at home until it go too bad resulting in her admission last night.  She has seen Dr. Leighton Ruff about a proctocolectomy later in November.  Since admission and NG placement, her cramping pains have subsided.  Still no flatus or BMs.    Past Medical History  Diagnosis Date  . Hypertension   . Colitis, ulcerative ([place])   . Hyperlipidemia   . Colon polyps   . Blood transfusion   . Anemia     Past Surgical History  Procedure Laterality Date  . Gravida 6 para 2      all SAB  . Hyadiform mole    . Dilation and curettage of uterus      most likely after miscarriage  . Knee arthroscopy w/ meniscal repair      right knee ([place])  . Fibrocystic breast disease      q 6 month mammogram  . Abdominal hysterectomy  2000  . Small bowel obstruction      Current Facility-Administered Medications  Medication Dose Route Frequency Provider Last Rate Last Dose  . 0.9 %  sodium chloride infusion   Intravenous Continuous Ivor Costa, MD 75 mL/hr at 09/01/15 0442 75 mL/hr at 09/01/15 0442  . acetaminophen (TYLENOL) tablet 650 mg  650 mg Oral Q6H PRN Ivor Costa, MD       Or  . acetaminophen (TYLENOL) suppository 650 mg  650 mg Rectal Q6H PRN Ivor Costa, MD      . ALPRAZolam Duanne Moron) tablet 0.25 mg  0.25 mg Oral BID PRN Ivor Costa, MD      . antiseptic oral rinse (CPC / CETYLPYRIDINIUM CHLORIDE 0.05%) solution 7 mL  7 mL Mouth Rinse BID Eugenie Filler, MD   7 mL at 09/01/15 0950  . aspirin chewable tablet 81 mg  81 mg Oral Daily PRN Ivor Costa, MD      . calcium-vitamin D (OSCAL WITH D) 500-200 MG-UNIT per tablet 1 tablet  1 tablet Oral Daily Ivor Costa, MD   1 tablet at 09/01/15 1000  . irbesartan (AVAPRO) tablet 300 mg  300 mg Oral Daily Ivor Costa, MD   300 mg at 09/01/15 1000   And  . hydrochlorothiazide  (MICROZIDE) capsule 12.5 mg  12.5 mg Oral Daily Ivor Costa, MD   12.5 mg at 09/01/15 1000  . mesalamine (LIALDA) EC tablet 2.4 g  2.4 g Oral BID Ivor Costa, MD   2.4 g at 09/01/15 0436  . morphine 2 MG/ML injection 2 mg  2 mg Intravenous Q3H PRN Ivor Costa, MD   2 mg at 09/01/15 0441  . multivitamin with minerals tablet 1 tablet  1 tablet Oral Daily Ivor Costa, MD   1 tablet at 09/01/15 1000  . ondansetron (ZOFRAN) injection 4 mg  4 mg Intravenous Q8H PRN Ivor Costa, MD      . predniSONE (DELTASONE) tablet 15 mg  15 mg Oral Q breakfast Ivor Costa, MD   15 mg at 09/01/15 0800   Review of patient's allergies indicates no known allergies. Family History  Problem Relation Age of Onset  . Diabetes Mother   . Breast cancer Mother 55  . Diabetes Father   . Heart disease Father   . Diabetes Sister   . Colon cancer Paternal Aunt   . Diabetes Brother   .  Breast cancer Sister 65   Social History:   reports that she has never smoked. She has never used smokeless tobacco. She reports that she drinks about 1.0 oz of alcohol per week. She reports that she does not use illicit drugs.   REVIEW OF SYSTEMS : Negative except for see problem list  Physical Exam:   Blood pressure 126/76, pulse 81, temperature 98.1 F (36.7 C), temperature source Oral, resp. rate 16, height _0  (1.626 m), weight 88.905 kg (196 lb), SpO2 97 %. Body mass index is 33.63 kg/(m^2).  Gen:  WDWN AAF NAD  Neurological: Alert and oriented to person, place, and time. Motor and sensory function is grossly intact  Head: Normocephalic and atraumatic.  Eyes: Conjunctivae are normal. Pupils are equal, round, and reactive to light. No scleral icterus.  Neck: Normal range of motion. Neck supple. No tracheal deviation or thyromegaly present.  Abdomen:  Mildly distended without rebound or guarding Musculoskeletal: Normal range of motion. Extremities are nontender. No cyanosis, edema or clubbing noted Lymphadenopathy: No cervical,  preauricular, postauricular or axillary adenopathy is present Skin: Skin is warm and dry. No rash noted. No diaphoresis. No erythema. No pallor. Pscyh: Normal mood and affect. Behavior is normal. Judgment and thought content normal.   LABORATORY RESULTS: Results for orders placed or performed during the hospital encounter of 09/01/15 (from the past 48 hour(s))  Urinalysis, Routine w reflex microscopic (not at [facility])     Status: Abnormal   Collection Time: 09/01/15 12:32 AM  Result Value Ref Range   Color, Urine YELLOW YELLOW   APPearance CLOUDY (A) CLEAR   Specific Gravity, Urine 1.030 1.005 - 1.030   pH 7.0 5.0 - 8.0   Glucose, UA NEGATIVE NEGATIVE mg/dL   Hgb urine dipstick NEGATIVE NEGATIVE   Bilirubin Urine NEGATIVE NEGATIVE   Ketones, ur NEGATIVE NEGATIVE mg/dL   Protein, ur NEGATIVE NEGATIVE mg/dL   Urobilinogen, UA 0.2 0.0 - 1.0 mg/dL   Nitrite NEGATIVE NEGATIVE   Leukocytes, UA NEGATIVE NEGATIVE    Comment: MICROSCOPIC NOT DONE ON URINES WITH NEGATIVE PROTEIN, BLOOD, LEUKOCYTES, NITRITE, OR GLUCOSE <1000 mg/dL.  Lipase, blood     Status: None   Collection Time: 09/01/15 12:41 AM  Result Value Ref Range   Lipase 23 11 - 51 U/L    Comment: Please note change in reference range.  Comprehensive metabolic panel     Status: Abnormal   Collection Time: 09/01/15 12:41 AM  Result Value Ref Range   Sodium 139 135 - 145 mmol/L   Potassium 3.6 3.5 - 5.1 mmol/L   Chloride 101 101 - 111 mmol/L   CO2 29 22 - 32 mmol/L   Glucose, Bld 102 (H) 65 - 99 mg/dL   BUN 19 6 - 20 mg/dL   Creatinine, Ser 0.96 0.44 - 1.00 mg/dL   Calcium 9.5 8.9 - 10.3 mg/dL   Total Protein 7.9 6.5 - 8.1 g/dL   Albumin 4.1 3.5 - 5.0 g/dL   AST 26 15 - 41 U/L   ALT 21 14 - 54 U/L   Alkaline Phosphatase 78 38 - 126 U/L   Total Bilirubin 0.6 0.3 - 1.2 mg/dL   GFR calc non Af Amer >60 >60 mL/min   GFR calc Af Amer >60 >60 mL/min    Comment: (NOTE) The eGFR has been calculated using the CKD EPI equation. This  calculation has not been validated in all clinical situations. eGFR's persistently <60 mL/min signify possible Chronic Kidney Disease.  Anion gap 9 5 - 15  CBC     Status: Abnormal   Collection Time: 09/01/15 12:41 AM  Result Value Ref Range   WBC 14.3 (H) 4.0 - 10.5 K/uL   RBC 4.41 3.87 - 5.11 MIL/uL   Hemoglobin 13.1 12.0 - 15.0 g/dL   HCT 39.8 36.0 - 46.0 %   MCV 90.2 78.0 - 100.0 fL   MCH 29.7 26.0 - 34.0 pg   MCHC 32.9 30.0 - 36.0 g/dL   RDW 13.4 11.5 - 15.5 %   Platelets 364 150 - 400 K/uL  Lactic acid, plasma     Status: None   Collection Time: 09/01/15 12:41 AM  Result Value Ref Range   Lactic Acid, Venous 1.4 0.5 - 2.0 mmol/L  ABO/Rh     Status: None   Collection Time: 09/01/15  2:58 AM  Result Value Ref Range   ABO/RH(D) B NEG   Lactic acid, plasma     Status: None   Collection Time: 09/01/15  5:18 AM  Result Value Ref Range   Lactic Acid, Venous 1.0 0.5 - 2.0 mmol/L  Cortisol-am, blood     Status: Abnormal   Collection Time: 09/01/15  5:18 AM  Result Value Ref Range   Cortisol - AM 40.2 (H) 6.7 - 22.6 ug/dL    Comment: Performed at [facility]     Status: None   Collection Time: 09/01/15  5:18 AM  Result Value Ref Range   Prothrombin Time 12.4 11.6 - 15.2 seconds   INR 0.90 0.00 - 1.49  APTT     Status: Abnormal   Collection Time: 09/01/15  5:18 AM  Result Value Ref Range   aPTT 23 (L) 24 - 37 seconds  Type and screen [place]     Status: None   Collection Time: 09/01/15  5:18 AM  Result Value Ref Range   ABO/RH(D) B NEG    Antibody Screen NEG    Sample Expiration 34/19/6222   Basic metabolic panel     Status: Abnormal   Collection Time: 09/01/15  5:18 AM  Result Value Ref Range   Sodium 140 135 - 145 mmol/L   Potassium 3.2 (L) 3.5 - 5.1 mmol/L   Chloride 103 101 - 111 mmol/L   CO2 29 22 - 32 mmol/L   Glucose, Bld 89 65 - 99 mg/dL   BUN 18 6 - 20 mg/dL   Creatinine, Ser 0.96 0.44 - 1.00 mg/dL   Calcium  8.9 8.9 - 10.3 mg/dL   GFR calc non Af Amer >60 >60 mL/min   GFR calc Af Amer >60 >60 mL/min    Comment: (NOTE) The eGFR has been calculated using the CKD EPI equation. This calculation has not been validated in all clinical situations. eGFR's persistently <60 mL/min signify possible Chronic Kidney Disease.    Anion gap 8 5 - 15  CBC with Differential/Platelet     Status: Abnormal   Collection Time: 09/01/15  5:18 AM  Result Value Ref Range   WBC 12.7 (H) 4.0 - 10.5 K/uL   RBC 4.28 3.87 - 5.11 MIL/uL   Hemoglobin 12.6 12.0 - 15.0 g/dL   HCT 37.9 36.0 - 46.0 %   MCV 88.6 78.0 - 100.0 fL   MCH 29.4 26.0 - 34.0 pg   MCHC 33.2 30.0 - 36.0 g/dL   RDW 13.5 11.5 - 15.5 %   Platelets 292 150 - 400 K/uL   Neutrophils Relative % 72 %  Neutro Abs 9.1 (H) 1.7 - 7.7 K/uL   Lymphocytes Relative 23 %   Lymphs Abs 2.9 0.7 - 4.0 K/uL   Monocytes Relative 5 %   Monocytes Absolute 0.7 0.1 - 1.0 K/uL   Eosinophils Relative 0 %   Eosinophils Absolute 0.0 0.0 - 0.7 K/uL   Basophils Relative 0 %   Basophils Absolute 0.0 0.0 - 0.1 K/uL     RADIOLOGY RESULTS: Dg Abd Acute W/chest  09/01/2015  CLINICAL DATA:  Nausea, vomiting, and abdominal pain since yesterday. History of bowel obstructions. EXAM: DG ABDOMEN ACUTE W/ 1V CHEST COMPARISON:  CT and radiographs 05/17/2015 FINDINGS: The cardiomediastinal contours are normal. The lungs are clear. There is no free intra-abdominal air. Small air-fluid level in the stomach. Probable fluid-filled small bowel loops in the left abdomen measuring up to 3.6 cm, no air-filled dilated bowel loops are seen. Small volume of stool throughout the colon. No radiopaque calculi. No acute osseous abnormalities are seen. IMPRESSION: Suspect fluid-filled dilated bowel loops in the left abdomen, given history may reflect early small bowel obstruction. Electronically Signed   By: Jeb Levering M.D.   On: 09/01/2015 01:52    Problem List: Patient Active Problem List    Diagnosis Date Noted  . HLD (hyperlipidemia) 09/01/2015  . Anxiety 09/01/2015  . Pernicious anemia 05/28/2015  . DOE (dyspnea on exertion) 05/28/2015  . Routine general medical examination at a health care facility 05/28/2015  . Hypokalemia 05/28/2015  . SBO (small bowel obstruction) ([place]) 05/17/2015  . Ulcerative colitis with intestinal obstruction ([place]) 05/17/2015  . Leukocytosis 05/17/2015  . Essential hypertension 12/30/2007    Assessment & Plan: Ulcerative colitis previously followed by Dr. Olevia Perches and now followed by Dr. Skeet Latch.  Followed by Dr. Leighton Ruff for upcoming ulcerative colitis. [facility] will follow with you.      Matt B. Hassell Done, MD, [facility] [phone] beeper [phone]  09/01/2015 11:56 AM

## 2015-09-01 NOTE — Progress Notes (Signed)
I have seen and assessed patient and agree with Dr Edgar Frisk assessment and plan. Patient is a pleasant 59 year old female history of ulcerative colitis with 2 recent episodes of small bowel obstruction this here presented to the ED with probable early small bowel obstruction. Patient with NG tube in place with some clinical improvement. No bowel movement. No flatus. Patient was supposed to have surgery on 16/08/9603 with Dr. Leighton Ruff. Will consult with general surgery for further evaluation and management.

## 2015-09-01 NOTE — ED Notes (Signed)
Pt presents d/t abdominal pain, N/V since yesterday.  Pt has hx of previous obstructions and states that this feels the same. Pain is generalized, 10/10 cramping in nature. Pt vomiting in triage.

## 2015-09-02 ENCOUNTER — Inpatient Hospital Stay (HOSPITAL_COMMUNITY): Payer: 59

## 2015-09-02 DIAGNOSIS — E876 Hypokalemia: Secondary | ICD-10-CM

## 2015-09-02 DIAGNOSIS — F419 Anxiety disorder, unspecified: Secondary | ICD-10-CM

## 2015-09-02 LAB — COMPREHENSIVE METABOLIC PANEL
ALK PHOS: 62 U/L (ref 38–126)
ALT: 18 U/L (ref 14–54)
AST: 15 U/L (ref 15–41)
Albumin: 3.1 g/dL — ABNORMAL LOW (ref 3.5–5.0)
Anion gap: 8 (ref 5–15)
BUN: 14 mg/dL (ref 6–20)
CALCIUM: 8.4 mg/dL — AB (ref 8.9–10.3)
CHLORIDE: 105 mmol/L (ref 101–111)
CO2: 27 mmol/L (ref 22–32)
CREATININE: 0.71 mg/dL (ref 0.44–1.00)
GFR calc Af Amer: >60 mL/min (ref >60)
GFR calc non Af Amer: >60 mL/min (ref >60)
GLUCOSE: 76 mg/dL (ref 65–99)
Potassium: 3.3 mmol/L — ABNORMAL LOW (ref 3.5–5.1)
SODIUM: 140 mmol/L (ref 135–145)
Total Bilirubin: 0.8 mg/dL (ref 0.3–1.2)
Total Protein: 6.2 g/dL — ABNORMAL LOW (ref 6.5–8.1)

## 2015-09-02 LAB — CBC
HCT: 34.8 % — ABNORMAL LOW (ref 36.0–46.0)
Hemoglobin: 11.4 g/dL — ABNORMAL LOW (ref 12.0–15.0)
MCH: 29.6 pg (ref 26.0–34.0)
MCHC: 32.8 g/dL (ref 30.0–36.0)
MCV: 90.4 fL (ref 78.0–100.0)
PLATELETS: 310 10*3/uL (ref 150–400)
RBC: 3.85 MIL/uL — ABNORMAL LOW (ref 3.87–5.11)
RDW: 13.4 % (ref 11.5–15.5)
WBC: 9.1 10*3/uL (ref 4.0–10.5)

## 2015-09-02 LAB — GLUCOSE, CAPILLARY: Glucose-Capillary: 65 mg/dL (ref 65–99)

## 2015-09-02 LAB — MAGNESIUM: MAGNESIUM: 2 mg/dL (ref 1.7–2.4)

## 2015-09-02 MED ORDER — KETOROLAC TROMETHAMINE 30 MG/ML IJ SOLN: 30.0000 mg | Freq: Four times a day (QID) | INTRAMUSCULAR | Status: DC | PRN

## 2015-09-02 MED ORDER — KCL IN DEXTROSE-NACL 40-5-0.9 MEQ/L-%-% IV SOLN
INTRAVENOUS | Status: DC
  Administered 2015-09-02: 75 mL/h via INTRAVENOUS
  Administered 2015-09-03: 06:00:00 via INTRAVENOUS
  Filled 2015-09-02 (×3): qty 1000

## 2015-09-02 MED ORDER — POTASSIUM CHLORIDE CRYS ER 20 MEQ PO TBCR
40.0000 meq | EXTENDED_RELEASE_TABLET | ORAL | Status: AC
  Administered 2015-09-02 (×2): 40 meq via ORAL
  Filled 2015-09-02 (×2): qty 2

## 2015-09-02 MED ORDER — POTASSIUM CHLORIDE 10 MEQ/100ML IV SOLN
10.0000 meq | INTRAVENOUS | Status: DC
  Administered 2015-09-02: 10 meq via INTRAVENOUS
  Filled 2015-09-02 (×4): qty 100

## 2015-09-02 MED ORDER — PROCHLORPERAZINE EDISYLATE 5 MG/ML IJ SOLN: 10.0000 mg | Freq: Four times a day (QID) | INTRAMUSCULAR | Status: DC | PRN

## 2015-09-02 MED ORDER — POTASSIUM CHLORIDE 2 MEQ/ML IV SOLN
INTRAVENOUS | Status: DC
Start: 2015-09-02 — End: 2015-09-02

## 2015-09-02 MED ORDER — GLUCOSE 40 % PO GEL
ORAL | Status: DC
Start: 2015-09-02 — End: 2015-09-02
  Filled 2015-09-02: qty 1

## 2015-09-02 MED ORDER — LORAZEPAM 2 MG/ML IJ SOLN: 0.5000 mg | Freq: Two times a day (BID) | INTRAMUSCULAR | Status: DC | PRN

## 2015-09-02 NOTE — Progress Notes (Signed)
OT Cancellation Note  Patient Details Name: Latasha Wood MRN: [id number] DOB: Nov 07, 1956   Cancelled Treatment:    Reason Eval/Treat Not Completed: OT screened, no needs identified, will sign off  [place], Hilde Churchman D 09/02/2015, 10:30 AM

## 2015-09-02 NOTE — Progress Notes (Signed)
pts Latasha Wood this am is 64. Pt is not diabetic. Dr Grandville Silos notified and Loraine Maple does he want hypoglycemic protocol

## 2015-09-02 NOTE — Progress Notes (Signed)
[facility] PROGRESS NOTE  Latasha Wood GGY:[id number] DOB: 02-20-1956 DOA: 09/01/2015 PCP: Scarlette Calico, MD  Assessment/Plan: #1 early small bowel obstruction Per abdominal films and symptoms of nausea vomiting abdominal pain. Likely secondary to adhesions. Patient with clinical improvement. NG tube in place. Abdominal films pending. Continue supportive care. General surgery following.  #2 hypertension Continue current regimen.  #3 ulcerative colitis Patient's postal have surgery per Dr. Marcello Moores on 09/25/2015. Continue mesalamine and prednisone. Outpatient follow-up.  #4 leukocytosis Likely chronic in nature secondary to chronic steroids. Chest x-ray negative for any acute infiltrates. Patient is afebrile. Urinalysis negative. WBC trended down.  #5 hypokalemia Replete.  #6 anxiety No suicidal or homicidal ideations. Continue anxiolytics as needed.  #7 prophylaxis SCDs for DVT prophylaxis.  Code Status: Full Family Communication: Updated patient and family at bedside. Disposition Plan: Home once small bowel obstruction has resolved.   Consultants:  Gen. surgery: Dr. Hassell Done 09/01/2015  Procedures:  Acute abdominal series 09/01/2015  Antibiotics:  None  HPI/Subjective: Patient states feeling better than on admission. Patient with no emesis. Patient states after NG tube was clamped had some nausea. Some improvement with abdominal pain. No flatus. No bowel movement.  Objective: Filed Vitals:   09/02/15 0436  BP: 142/76  Pulse: 69  Temp: 98.1 F (36.7 C)  Resp: 18    Intake/Output Summary (Last 24 hours) at 09/02/15 1046 Last data filed at 09/02/15 0436  Gross per 24 hour  Intake    630 ml  Output   1800 ml  Net  -1170 ml   Filed Weights   09/01/15 0024  Weight: 88.905 kg (196 lb)    Exam:   General:  NAD. NGT  Cardiovascular: RRR  Respiratory: Clear to auscultation bilaterally.  Abdomen: Soft, some diffuse tenderness to palpation,  positive bowel sounds, no rebound, no guarding  Musculoskeletal: No clubbing cyanosis or edema  Data Reviewed: Basic Metabolic Panel:  Recent Labs Lab 09/01/15 0041 09/01/15 0518 09/02/15 0446  NA 139 140 140  K 3.6 3.2* 3.3*  CL 101 103 105  CO2 29 29 27   GLUCOSE 102* 89 76  BUN 19 18 14   CREATININE 0.96 0.96 0.71  CALCIUM 9.5 8.9 8.4*  MG  --   --  2.0   Liver Function Tests:  Recent Labs Lab 09/01/15 0041 09/02/15 0446  AST 26 15  ALT 21 18  ALKPHOS 78 62  BILITOT 0.6 0.8  PROT 7.9 6.2*  ALBUMIN 4.1 3.1*    Recent Labs Lab 09/01/15 0041  LIPASE 23   No results for input(s): AMMONIA in the last 168 hours. CBC:  Recent Labs Lab 09/01/15 0041 09/01/15 0518 09/02/15 0446  WBC 14.3* 12.7* 9.1  NEUTROABS  --  9.1*  --   HGB 13.1 12.6 11.4*  HCT 39.8 37.9 34.8*  MCV 90.2 88.6 90.4  PLT 364 292 310   Cardiac Enzymes: No results for input(s): CKTOTAL, CKMB, CKMBINDEX, TROPONINI in the last 168 hours. BNP (last 3 results) No results for input(s): BNP in the last 8760 hours.  ProBNP (last 3 results) No results for input(s): PROBNP in the last 8760 hours.  CBG:  Recent Labs Lab 09/01/15 0817 09/02/15 0809  GLUCAP 108* 65    No results found for this or any previous visit (from the past 240 hour(s)).   Studies: Dg Abd Acute W/chest  09/01/2015  CLINICAL DATA:  Nausea, vomiting, and abdominal pain since yesterday. History of bowel obstructions. EXAM: DG ABDOMEN ACUTE W/ 1V CHEST  COMPARISON:  CT and radiographs 05/17/2015 FINDINGS: The cardiomediastinal contours are normal. The lungs are clear. There is no free intra-abdominal air. Small air-fluid level in the stomach. Probable fluid-filled small bowel loops in the left abdomen measuring up to 3.6 cm, no air-filled dilated bowel loops are seen. Small volume of stool throughout the colon. No radiopaque calculi. No acute osseous abnormalities are seen. IMPRESSION: Suspect fluid-filled dilated bowel  loops in the left abdomen, given history may reflect early small bowel obstruction. Electronically Signed   By: Jeb Levering M.D.   On: 09/01/2015 01:52    Scheduled Meds: . antiseptic oral rinse  7 mL Mouth Rinse BID  . calcium-vitamin D  1 tablet Oral Daily  . dextrose      . irbesartan  300 mg Oral Daily   And  . hydrochlorothiazide  12.5 mg Oral Daily  . mesalamine  2.4 g Oral BID  . potassium chloride  10 mEq Intravenous Q1 Hr x 4  . predniSONE  15 mg Oral Q breakfast   Continuous Infusions: . dextrose 5 % and 0.9% NaCl 1,000 mL with potassium chloride 40 mEq infusion      Principal Problem:   SBO (small bowel obstruction) (HCC) Active Problems:   Essential hypertension   Ulcerative colitis with intestinal obstruction (HCC)   Leukocytosis   Hypokalemia   HLD (hyperlipidemia)   Anxiety    Time spent: 18 minutes    THOMPSON,DANIEL M.D. [facility] Pager [phone]. If 7PM-7AM, please contact night-coverage at [email or web address], password [facility] 09/02/2015, 10:46 AM  LOS: 1 day

## 2015-09-02 NOTE — Progress Notes (Signed)
SBO (small bowel obstruction) (HCC)  Subjective: Patient still with no bowel function.  NG functioning.   Objective: Vital signs in last 24 hours: Temp:  [97.4 F (36.3 C)-98.1 F (36.7 C)] 98.1 F (36.7 C) (10/24 0436) Pulse Rate:  [67-69] 69 (10/24 0436) Resp:  [16-18] 18 (10/24 0436) BP: (135-142)/(70-84) 142/76 mmHg (10/24 0436) SpO2:  [98 %] 98 % (10/24 0436) Last BM Date: 08/31/15  Intake/Output from previous day: 10/23 0701 - 10/24 0700 In: 1057.5 [P.O.:30; I.V.:997.5; NG/GT:30] Out: 1800 [Urine:1050; Emesis/NG output:750] Intake/Output this shift:    General appearance: alert and cooperative GI: soft, non-distended, non-tender NG with bilious output  Lab Results:  No results found for this or any previous visit (from the past 24 hour(s)).   Studies/Results Radiology     MEDS, Scheduled . antiseptic oral rinse  7 mL Mouth Rinse BID  . calcium-vitamin D  1 tablet Oral Daily  . irbesartan  300 mg Oral Daily   And  . hydrochlorothiazide  12.5 mg Oral Daily  . mesalamine  2.4 g Oral BID  . predniSONE  15 mg Oral Q breakfast     Assessment: SBO (small bowel obstruction) ([place]) I do not think this episode is due to a UC flare.  Pt has had several good weeks and there is solid stool noted in her colon on plain films.  Plan: Cont NG Repeat AXR today Ambulate    LOS: 1 day    Rosario Adie, MD [facility], [place]   09/02/2015 8:46 AM

## 2015-09-03 ENCOUNTER — Ambulatory Visit: Payer: 59 | Admitting: Internal Medicine

## 2015-09-03 LAB — GLUCOSE, CAPILLARY: Glucose-Capillary: 94 mg/dL (ref 65–99)

## 2015-09-03 LAB — BASIC METABOLIC PANEL
ANION GAP: 6 (ref 5–15)
BUN: 15 mg/dL (ref 6–20)
CALCIUM: 8.4 mg/dL — AB (ref 8.9–10.3)
CO2: 25 mmol/L (ref 22–32)
CREATININE: 0.91 mg/dL (ref 0.44–1.00)
Chloride: 108 mmol/L (ref 101–111)
GLUCOSE: 91 mg/dL (ref 65–99)
Potassium: 4 mmol/L (ref 3.5–5.1)
Sodium: 139 mmol/L (ref 135–145)

## 2015-09-03 MED ORDER — ONDANSETRON 4 MG PO TBDP: 4.0000 mg | ORAL_TABLET | Freq: Three times a day (TID) | ORAL | Status: DC | PRN

## 2015-09-03 MED ORDER — PREDNISONE 10 MG PO TABS: 15.0000 mg | ORAL_TABLET | Freq: Every day | ORAL | Status: DC

## 2015-09-03 NOTE — Progress Notes (Signed)
SBO (small bowel obstruction) (HCC)  Subjective: Bowel obstruction seems to be resolving.  Having bowel function.  No nausea  Objective: Vital signs in last 24 hours: Temp:  [98.1 F (36.7 C)-99.4 F (37.4 C)] 98.1 F (36.7 C) (10/25 0440) Pulse Rate:  [75-89] 77 (10/25 0440) Resp:  [16] 16 (10/25 0440) BP: (104-140)/(73-76) 122/73 mmHg (10/25 0440) SpO2:  [98 %-100 %] 100 % (10/25 0440) Last BM Date: 08/31/15  Intake/Output from previous day: 10/24 0701 - 10/25 0700 In: 1807.5 [P.O.:360; I.V.:1347.5; IV Piggyback:100] Out: 5015 [Urine:1775; Stool:1] Intake/Output this shift:   General appearance: alert and cooperative GI: soft, non-distended, non-tender NG out  Lab Results:  Results for orders placed or performed during the hospital encounter of 09/01/15 (from the past 24 hour(s))  Basic metabolic panel     Status: Abnormal   Collection Time: 09/03/15  4:20 AM  Result Value Ref Range   Sodium 139 135 - 145 mmol/L   Potassium 4.0 3.5 - 5.1 mmol/L   Chloride 108 101 - 111 mmol/L   CO2 25 22 - 32 mmol/L   Glucose, Bld 91 65 - 99 mg/dL   BUN 15 6 - 20 mg/dL   Creatinine, Ser 0.91 0.44 - 1.00 mg/dL   Calcium 8.4 (L) 8.9 - 10.3 mg/dL   GFR calc non Af Amer >60 >60 mL/min   GFR calc Af Amer >60 >60 mL/min   Anion gap 6 5 - 15  Glucose, capillary     Status: None   Collection Time: 09/03/15  5:15 AM  Result Value Ref Range   Glucose-Capillary 94 65 - 99 mg/dL   Comment 1 Notify RN      Studies/Results Radiology     MEDS, Scheduled . antiseptic oral rinse  7 mL Mouth Rinse BID  . calcium-vitamin D  1 tablet Oral Daily  . irbesartan  300 mg Oral Daily   And  . hydrochlorothiazide  12.5 mg Oral Daily  . mesalamine  2.4 g Oral BID  . predniSONE  15 mg Oral Q breakfast     Assessment: SBO (small bowel obstruction) ([place]) I do not think this episode is due to a UC flare.  Pt has had several good weeks and there is solid stool noted in her colon on plain  films.  Plan: NG out, clears ordered for this am. Would allow fulls or soft diet for lunch and then home this evening if she tolerates advancements in her diet Does not need to f/u with me as an outpatient, just return for surgery in Nov Will sign off.  Please call me if anything changes    LOS: 2 days    Rosario Adie, MD [facility], [place]   09/03/2015 8:24 AM

## 2015-09-03 NOTE — Discharge Summary (Signed)
Physician Discharge Summary  Latasha Wood CXK:[id number] DOB: [birth year] DOA: 09/01/2015  PCP: Latasha Calico, MD  Admit date: 09/01/2015 Discharge date: 09/03/2015  Time spent: 65 minutes  Recommendations for Outpatient Follow-up:  1. Follow-up with Latasha Calico, MD in 1-2 weeks. On follow-up patient's early small bowel obstruction to be reassessed. Patient may benefit from a basic metabolic profile Wood to follow-up on electrolytes and renal function. 2. Follow-up with Latasha Wood general surgery as previously scheduled.  Discharge Diagnoses:  Principal Problem:   SBO (small bowel obstruction) (HCC) Active Problems:   Essential hypertension   Ulcerative colitis with intestinal obstruction (HCC)   Leukocytosis   Hypokalemia   HLD (hyperlipidemia)   Anxiety   Discharge Condition: Stable and improved  Diet recommendation: Soft diet.  Filed Weights   09/01/15 0024  Weight: 88.905 kg (196 lb)    History of present illness:  Per Latasha Wood is a 59 y.o. female with PMH of ulcerative Colitis, hypertension, hyperlipidemia, small bowel obstruction, anxiety, hyperlipidemia, who presented with nausea, vomiting, abdominal pain.  Patient reported that she started having nausea, vomiting, abdominal pain 1 day prior to admission. She vomited twice on the day of admission, without blood in the vomitus. She did not have fever, chills, diarrhea. She had bowel movement with small amount of stool at 10 PM on the day of admission. She did not have chest pain, shortness of breath, cough, symptoms of a UTI, unilateral weakness.  In ED, patient was found to have leukocytosis with WBC 14.3, temperature normal, no tachycardia. Electrolytes okay. Lipase 23. Acute abdomen and chest x-ray showed possible early stage of small bowel obstruction. Patient was admitted to inpatient for further intervention and treatment with general surgeon was consulted by ED  Hospital Course:   #1 Early small bowel obstruction Patient had presented with nausea vomiting abdominal pain and abdominal films worrisome for early small bowel obstruction area likely secondary to adhesions. Patient was admitted placed on bowel rest, IV fluid supportive care and a NG tube was placed. Patient had significant output from NG tube. Patient improved clinically. General surgery was consulted and followed the patient throughout the hospitalization. Patient improved clinically. Repeat abdominal films showed resolution of early small bowel obstruction. NG tube was removed. Patient was started on clears which she tolerated. Patient's diet was subsequently advanced to full liquid diet which she tolerated without any further abdominal pain nausea or vomiting. Patient be discharged on a soft diet and will follow-up with PCP one to 2 weeks post discharge. Patient will be discharged in stable and improved condition.   #2 hypertension Patient was maintained on home regimen of Avapro and HCTZ. Outpatient follow-up.   #3 ulcerative colitis Patient was supposed have surgery per Latasha Wood on 09/25/2015. Patient was continued on her home regimen of mesalamine and IV steroids. Patient will be discharged back on her home dose of oral prednisone. Outpatient follow-up.  #4 leukocytosis Likely chronic in nature secondary to chronic steroids. Chest x-ray negative for any acute infiltrates. Patient is afebrile. Urinalysis negative. WBC trended down and leukocytosis had resolved by day of discharge.  #5 hypokalemia Repleted.  #6 anxiety No suicidal or homicidal ideations. Continued anxiolytics as needed.   Procedures:  Acute abdominal series 09/01/2015  Abdominal x-rays 09/02/2015  Consultations:  Gen. surgery: Latasha Wood 09/01/2015  Discharge Exam: Filed Vitals:   09/03/15 1400  BP: 136/91  Pulse: 80  Temp: 98.4 F (36.9 C)  Resp: 16    General:  NAD Cardiovascular: RRR Respiratory: CTAB  Discharge  Instructions   Discharge Instructions    Diet general    Complete by:  As directed   Soft diet     Discharge instructions    Complete by:  As directed   Follow up with Latasha Calico, MD in 1-2 weeks.     Increase activity slowly    Complete by:  As directed           Current Discharge Medication List    START taking these medications   Details  ondansetron (ZOFRAN-ODT) 4 MG disintegrating tablet Take 1 tablet (4 mg total) by mouth every 8 (eight) hours as needed for nausea or vomiting. Qty: 20 tablet, Refills: 0      CONTINUE these medications which have CHANGED   Details  predniSONE (DELTASONE) 10 MG tablet Take 1.5 tablets (15 mg total) by mouth daily with breakfast. Take 30-62m by mouth once daily Qty: 100 tablet, Refills: 0      CONTINUE these medications which have NOT CHANGED   Details  ALPRAZolam (XANAX) 0.25 MG tablet Take 1 tablet (0.25 mg total) by mouth 2 (two) times daily as needed for anxiety. Qty: 60 tablet, Refills: 0    aspirin 81 MG chewable tablet Chew 81 mg by mouth daily as needed (been taking 81 mg aspirin for high blood pressure treatment the past couple of days).    calcium-vitamin D (OSCAL WITH D) 500-200 MG-UNIT per tablet Take 1 tablet by mouth daily.      mesalamine (LIALDA) 1.2 G EC tablet Take 2 tablets by mouth every morning and 2 tablets by mouth every evening. Qty: 360 tablet, Refills: 1    Multiple Vitamin (MULTIVITAMIN WITH MINERALS) TABS tablet Take 1 tablet by mouth daily.    olmesartan-hydrochlorothiazide (BENICAR HCT) 40-12.5 MG per tablet Take 1 tablet by mouth  daily Qty: 90 tablet, Refills: 1   Associated Diagnoses: Essential hypertension    potassium chloride SA (K-DUR,KLOR-CON) 20 MEQ tablet Take 1 tablet (20 mEq total) by mouth daily. Qty: 30 tablet, Refills: 11   Associated Diagnoses: Essential hypertension; Hypokalemia      STOP taking these medications     glycopyrrolate (ROBINUL) 2 MG tablet      mesalamine  (ROWASA) 4 G enema        No Known Allergies Follow-up Information    Follow up with TScarlette Calico MD. Schedule an appointment as soon as possible for a visit in 1 week.   Specialty:  Internal Medicine   Why:  F/U IN 1-2 WEEKS   Contact information:   [phone]       Follow up with TRosario Adie, MD.   Specialty:  General Surgery   Why:  Follow-up as previously scheduled.   Contact information:   [address] [place] [phone]       The results of significant diagnostics from this hospitalization (including imaging, microbiology, ancillary and laboratory) are listed below for reference.    Significant Diagnostic Studies: Mr EConsuela MimesW/o W/cm  926-Oct-2016 CLINICAL DATA:  Ulcerative colitis. Persistent diarrhea. Evaluate for possible Crohn disease. Recent small bowel obstruction and resultant hospitalization. EXAM: MR ABDOMEN AND PELVIS WITHOUT AND WITH CONTRAST (MR ENTEROGRAPHY) TECHNIQUE: Multiplanar, multisequence MRI of the abdomen and pelvis was performed both before and during bolus administration of intravenous contrast. Negative oral contrast VoLumen was given. CONTRAST:  138mMULTIHANCE GADOBENATE DIMEGLUMINE  529 MG/ML IV SOLN COMPARISON:  05/17/2015 FINDINGS: Lower chest: Normal heart size without pericardial or pleural effusion. Hepatobiliary: Subcentimeter left hepatic lobe cyst, including on image/series 7/3. Normal gallbladder, without biliary ductal dilatation. Pancreas: Variant of pancreas divisum, with the dorsal duct entering the duodenum on on image/series 35/3. Communication with the common duct is identified. No acute pancreatitis or pancreatic mass. Spleen: Normal in size, without focal abnormality. Adrenals/Urinary Tract: Normal adrenal glands. Normal left kidney, without hydronephrosis. Interpolar right renal lesion measures 12 millimeters, including on image/series 27/3.  Minimal complexity identified within. Example on image/series 49/1103. Normal urinary bladder. Stomach/Bowel: Normal stomach. Normal appearance of the colon, without evidence of active colitis. The previously described small bowel obstruction is resolved. There is no evidence of active enteritis. The terminal ileum is identified on image/series 80/1102. There are probable adhesions, as evidenced by anterior position of pelvic bowel loops. Example on image/series 91/1102. Vascular/Lymphatic: Normal caliber of the aorta and branch vessels. No abdominopelvic adenopathy. Reproductive: Hysterectomy. No adnexal mass. Other: No significant free fluid. Musculoskeletal: No acute osseous abnormality. IMPRESSION: 1. No evidence of active enteritis or colitis. 2. Resolution of small bowel obstruction since the prior CT. Probable adhesions, as evidenced by anterior position of pelvic small bowel loops. 3. Right renal lesion is most consistent with a minimally complex cyst. This has been similar in size back to 06/24/2011. Electronically Signed   By: Abigail Miyamoto M.D.   On: 08/09/2015 09:24   Mr [place] Pelvis W/o W/cm  08/09/2015  CLINICAL DATA:  Ulcerative colitis. Persistent diarrhea. Evaluate for possible Crohn disease. Recent small bowel obstruction and resultant hospitalization. EXAM: MR ABDOMEN AND PELVIS WITHOUT AND WITH CONTRAST (MR ENTEROGRAPHY) TECHNIQUE: Multiplanar, multisequence MRI of the abdomen and pelvis was performed both before and during bolus administration of intravenous contrast. Negative oral contrast VoLumen was given. CONTRAST:  59m MULTIHANCE GADOBENATE DIMEGLUMINE 529 MG/ML IV SOLN COMPARISON:  05/17/2015 FINDINGS: Lower chest: Normal heart size without pericardial or pleural effusion. Hepatobiliary: Subcentimeter left hepatic lobe cyst, including on image/series 7/3. Normal gallbladder, without biliary ductal dilatation. Pancreas: Variant of pancreas divisum, with the dorsal duct entering the  duodenum on on image/series 35/3. Communication with the common duct is identified. No acute pancreatitis or pancreatic mass. Spleen: Normal in size, without focal abnormality. Adrenals/Urinary Tract: Normal adrenal glands. Normal left kidney, without hydronephrosis. Interpolar right renal lesion measures 12 millimeters, including on image/series 27/3. Minimal complexity identified within. Example on image/series 49/1103. Normal urinary bladder. Stomach/Bowel: Normal stomach. Normal appearance of the colon, without evidence of active colitis. The previously described small bowel obstruction is resolved. There is no evidence of active enteritis. The terminal ileum is identified on image/series 80/1102. There are probable adhesions, as evidenced by anterior position of pelvic bowel loops. Example on image/series 91/1102. Vascular/Lymphatic: Normal caliber of the aorta and branch vessels. No abdominopelvic adenopathy. Reproductive: Hysterectomy. No adnexal mass. Other: No significant free fluid. Musculoskeletal: No acute osseous abnormality. IMPRESSION: 1. No evidence of active enteritis or colitis. 2. Resolution of small bowel obstruction since the prior CT. Probable adhesions, as evidenced by anterior position of pelvic small bowel loops. 3. Right renal lesion is most consistent with a minimally complex cyst. This has been similar in size back to 06/24/2011. Electronically Signed   By: KAbigail MiyamotoM.D.   On: 08/09/2015 09:24   Dg Abd 2 Views  09/02/2015  CLINICAL DATA:  Small-bowel obstruction EXAM: ABDOMEN - 2 VIEW COMPARISON:  09/01/2015 FINDINGS: Scattered large and small bowel gas is  noted. A nasogastric catheter is noted within the stomach. The degree of small bowel dilatation has improved when compare with the prior exam. Fecal material is noted throughout the colon. IMPRESSION: Resolution of previously seen small bowel dilatation. Electronically Signed   By: Inez Catalina M.D.   On: 09/02/2015 10:58   Dg  Abd Acute W/chest  09/01/2015  CLINICAL DATA:  Nausea, vomiting, and abdominal pain since yesterday. History of bowel obstructions. EXAM: DG ABDOMEN ACUTE W/ 1V CHEST COMPARISON:  CT and radiographs 05/17/2015 FINDINGS: The cardiomediastinal contours are normal. The lungs are clear. There is no free intra-abdominal air. Small air-fluid level in the stomach. Probable fluid-filled small bowel loops in the left abdomen measuring up to 3.6 cm, no air-filled dilated bowel loops are seen. Small volume of stool throughout the colon. No radiopaque calculi. No acute osseous abnormalities are seen. IMPRESSION: Suspect fluid-filled dilated bowel loops in the left abdomen, given history may reflect early small bowel obstruction. Electronically Signed   By: Jeb Levering M.D.   On: 09/01/2015 01:52    Microbiology: No results found for this or any previous visit (from the past 240 hour(s)).   Labs: Basic Metabolic Panel:  Recent Labs Lab 09/01/15 0041 09/01/15 0518 09/02/15 0446 09/03/15 0420  NA 139 140 140 139  K 3.6 3.2* 3.3* 4.0  CL 101 103 105 108  CO2 29 29 27 25   GLUCOSE 102* 89 76 91  BUN 19 18 14 15   CREATININE 0.96 0.96 0.71 0.91  CALCIUM 9.5 8.9 8.4* 8.4*  MG  --   --  2.0  --    Liver Function Tests:  Recent Labs Lab 09/01/15 0041 09/02/15 0446  AST 26 15  ALT 21 18  ALKPHOS 78 62  BILITOT 0.6 0.8  PROT 7.9 6.2*  ALBUMIN 4.1 3.1*    Recent Labs Lab 09/01/15 0041  LIPASE 23   No results for input(s): AMMONIA in the last 168 hours. CBC:  Recent Labs Lab 09/01/15 0041 09/01/15 0518 09/02/15 0446  WBC 14.3* 12.7* 9.1  NEUTROABS  --  9.1*  --   HGB 13.1 12.6 11.4*  HCT 39.8 37.9 34.8*  MCV 90.2 88.6 90.4  PLT 364 292 310   Cardiac Enzymes: No results for input(s): CKTOTAL, CKMB, CKMBINDEX, TROPONINI in the last 168 hours. BNP: BNP (last 3 results) No results for input(s): BNP in the last 8760 hours.  ProBNP (last 3 results) No results for input(s):  PROBNP in the last 8760 hours.  CBG:  Recent Labs Lab 09/01/15 0817 09/02/15 0809 09/03/15 0515  GLUCAP 108* 65 94       Signed:  THOMPSON,DANIEL MD [facility] 09/03/2015, 3:54 PM

## 2015-09-04 ENCOUNTER — Other Ambulatory Visit: Payer: Self-pay | Admitting: Internal Medicine

## 2015-09-04 ENCOUNTER — Other Ambulatory Visit: Payer: Self-pay | Admitting: General Surgery

## 2015-09-04 DIAGNOSIS — Z7189 Other specified counseling: Secondary | ICD-10-CM

## 2015-09-05 ENCOUNTER — Encounter: Payer: Self-pay | Admitting: Internal Medicine

## 2015-09-05 ENCOUNTER — Ambulatory Visit (INDEPENDENT_AMBULATORY_CARE_PROVIDER_SITE_OTHER): Payer: 59 | Admitting: Internal Medicine

## 2015-09-05 ENCOUNTER — Encounter (HOSPITAL_COMMUNITY): Payer: 59

## 2015-09-05 VITALS — BP 137/88 | HR 86 | Temp 99.1°F | Resp 16 | Ht 64.0 in | Wt 196.0 lb

## 2015-09-05 DIAGNOSIS — H9209 Otalgia, unspecified ear: Secondary | ICD-10-CM | POA: Insufficient documentation

## 2015-09-05 DIAGNOSIS — I10 Essential (primary) hypertension: Secondary | ICD-10-CM

## 2015-09-05 DIAGNOSIS — E118 Type 2 diabetes mellitus with unspecified complications: Secondary | ICD-10-CM

## 2015-09-05 DIAGNOSIS — H9202 Otalgia, left ear: Secondary | ICD-10-CM | POA: Diagnosis not present

## 2015-09-05 DIAGNOSIS — F419 Anxiety disorder, unspecified: Secondary | ICD-10-CM | POA: Diagnosis not present

## 2015-09-05 DIAGNOSIS — K56609 Unspecified intestinal obstruction, unspecified as to partial versus complete obstruction: Secondary | ICD-10-CM

## 2015-09-05 DIAGNOSIS — R7303 Prediabetes: Secondary | ICD-10-CM | POA: Insufficient documentation

## 2015-09-05 DIAGNOSIS — K5669 Other intestinal obstruction: Secondary | ICD-10-CM

## 2015-09-05 HISTORY — DX: Type 2 diabetes mellitus with unspecified complications: E11.8

## 2015-09-05 MED ORDER — ALPRAZOLAM 0.25 MG PO TABS: 0.2500 mg | ORAL_TABLET | Freq: Two times a day (BID) | ORAL | Status: DC | PRN

## 2015-09-05 NOTE — Patient Instructions (Signed)

## 2015-09-05 NOTE — Progress Notes (Signed)
Pre visit review using our clinic review tool, if applicable. No additional management support is needed unless otherwise documented below in the visit note. 

## 2015-09-05 NOTE — Progress Notes (Signed)
Subjective:  Patient ID: Latasha Wood, female    DOB: [birth year]  Age: 59 y.o. MRN: [id number]  CC: Diabetes and Hypertension   HPI Latasha Wood presents for f/up after a recent admission for SBO, she feels better but her main concern today is persistent pain over her left mastoid bone area that has been progressively getting worse over the last year, she wants a scan done of the area to see if there is lymphoma or an enlarged lymph node. She also brings blood work that was done over 2 months ago that revealed an A1C of 6.6%.  Outpatient Prescriptions Prior to Visit  Medication Sig Dispense Refill  . aspirin 81 MG chewable tablet Chew 81 mg by mouth daily as needed (been taking 81 mg aspirin for high blood pressure treatment the past couple of days).    . BENICAR HCT 40-12.5 MG tablet Take 1 tablet by mouth  daily 90 tablet 1  . calcium-vitamin D (OSCAL WITH D) 500-200 MG-UNIT per tablet Take 1 tablet by mouth daily.      . mesalamine (LIALDA) 1.2 G EC tablet Take 2 tablets by mouth every morning and 2 tablets by mouth every evening. 360 tablet 1  . Multiple Vitamin (MULTIVITAMIN WITH MINERALS) TABS tablet Take 1 tablet by mouth daily.    . potassium chloride SA (K-DUR,KLOR-CON) 20 MEQ tablet Take 1 tablet (20 mEq total) by mouth daily. 30 tablet 11  . predniSONE (DELTASONE) 10 MG tablet Take 1.5 tablets (15 mg total) by mouth daily with breakfast. Take 30-28m by mouth once daily 100 tablet 0  . ALPRAZolam (XANAX) 0.25 MG tablet Take 1 tablet (0.25 mg total) by mouth 2 (two) times daily as needed for anxiety. 60 tablet 0  . ondansetron (ZOFRAN-ODT) 4 MG disintegrating tablet Take 1 tablet (4 mg total) by mouth every 8 (eight) hours as needed for nausea or vomiting. 20 tablet 0   No facility-administered medications prior to visit.    ROS Review of Systems  Constitutional: Negative.  Negative for fever, chills, diaphoresis, appetite change and fatigue.  HENT: Negative.     Eyes: Negative.   Respiratory: Negative.  Negative for cough, choking, chest tightness, shortness of breath and stridor.   Cardiovascular: Negative.  Negative for chest pain, palpitations and leg swelling.  Gastrointestinal: Negative.  Negative for nausea, vomiting, abdominal pain, diarrhea, constipation and blood in stool.  Endocrine: Negative.  Negative for polydipsia, polyphagia and polyuria.  Genitourinary: Negative.  Negative for dysuria, urgency, frequency, hematuria, flank pain and difficulty urinating.  Musculoskeletal: Negative.  Negative for myalgias, back pain, joint swelling and arthralgias.  Skin: Negative.  Negative for rash.  Allergic/Immunologic: Negative.   Neurological: Negative.   Hematological: Negative.  Negative for adenopathy. Does not bruise/bleed easily.  Psychiatric/Behavioral: Negative for suicidal ideas, sleep disturbance, dysphoric mood and decreased concentration. The patient is nervous/anxious.     Objective:  BP 137/88 mmHg  Pulse 86  Temp(Src) 99.1 F (37.3 C) (Oral)  Resp 16  Ht 5' 4"  (1.626 m)  Wt 196 lb (88.905 kg)  BMI 33.63 kg/m2  SpO2 97%  BP Readings from Last 3 Encounters:  09/05/15 137/88  09/03/15 136/91  07/31/15 116/70    Wt Readings from Last 3 Encounters:  09/05/15 196 lb (88.905 kg)  09/01/15 196 lb (88.905 kg)  07/31/15 196 lb 6 oz (89.075 kg)    Physical Exam  Constitutional: She is oriented to person, place, and time. She appears well-developed and well-nourished. No  distress.  HENT:  Head: Normocephalic and atraumatic.  Mouth/Throat: Oropharynx is clear and moist. No oropharyngeal exudate.  Eyes: Conjunctivae are normal. Right eye exhibits no discharge. Left eye exhibits no discharge. No scleral icterus.  Neck: Normal range of motion. Neck supple. No JVD present. No tracheal deviation, no edema and no erythema present. No thyroid mass and no thyromegaly present.  Palpation of the left mastoid and the surrounding area  does not reveal any tenderness, LAD, or other abnormality.  Cardiovascular: Normal rate, regular rhythm, normal heart sounds and intact distal pulses.  Exam reveals no gallop and no friction rub.   No murmur heard. Pulmonary/Chest: Effort normal and breath sounds normal. No respiratory distress. She has no wheezes. She has no rales. She exhibits no tenderness.  Abdominal: Soft. Normal appearance. She exhibits no distension and no mass. Bowel sounds are decreased. There is no hepatosplenomegaly. There is no tenderness. There is no rebound and no guarding.  Musculoskeletal: Normal range of motion. She exhibits no edema or tenderness.  Lymphadenopathy:       Head (right side): No submental, no submandibular, no tonsillar, no preauricular, no posterior auricular and no occipital adenopathy present.       Head (left side): No submental, no submandibular, no tonsillar, no preauricular, no posterior auricular and no occipital adenopathy present.    She has no cervical adenopathy.       Right cervical: No superficial cervical, no deep cervical and no posterior cervical adenopathy present.      Left cervical: No superficial cervical, no deep cervical and no posterior cervical adenopathy present.    She has no axillary adenopathy.       Right: No supraclavicular adenopathy present.       Left: No supraclavicular adenopathy present.  Neurological: She is oriented to person, place, and time.  Skin: Skin is warm and dry. No rash noted. She is not diaphoretic. No erythema. No pallor.  Psychiatric: She has a normal mood and affect. Her behavior is normal. Judgment and thought content normal.    Lab Results  Component Value Date   WBC 9.1 09/02/2015   HGB 11.4* 09/02/2015   HCT 34.8* 09/02/2015   PLT 310 09/02/2015   GLUCOSE 91 09/03/2015   CHOL 245* 05/28/2015   TRIG 120.0 05/28/2015   HDL 74.50 05/28/2015   LDLDIRECT 157.8 09/05/2012   LDLCALC 147* 05/28/2015   ALT 18 09/02/2015   AST 15  09/02/2015   NA 139 09/03/2015   K 4.0 09/03/2015   CL 108 09/03/2015   CREATININE 0.91 09/03/2015   BUN 15 09/03/2015   CO2 25 09/03/2015   TSH 1.123 05/17/2015   INR 0.90 09/01/2015   HGBA1C 6.6* 06/17/2015    Dg Abd 2 Views  09/02/2015  CLINICAL DATA:  Small-bowel obstruction EXAM: ABDOMEN - 2 VIEW COMPARISON:  09/01/2015 FINDINGS: Scattered large and small bowel gas is noted. A nasogastric catheter is noted within the stomach. The degree of small bowel dilatation has improved when compare with the prior exam. Fecal material is noted throughout the colon. IMPRESSION: Resolution of previously seen small bowel dilatation. Electronically Signed   By: Inez Catalina M.D.   On: 09/02/2015 10:58   Dg Abd Acute W/chest  09/01/2015  CLINICAL DATA:  Nausea, vomiting, and abdominal pain since yesterday. History of bowel obstructions. EXAM: DG ABDOMEN ACUTE W/ 1V CHEST COMPARISON:  CT and radiographs 05/17/2015 FINDINGS: The cardiomediastinal contours are normal. The lungs are clear. There is no free intra-abdominal air.  Small air-fluid level in the stomach. Probable fluid-filled small bowel loops in the left abdomen measuring up to 3.6 cm, no air-filled dilated bowel loops are seen. Small volume of stool throughout the colon. No radiopaque calculi. No acute osseous abnormalities are seen. IMPRESSION: Suspect fluid-filled dilated bowel loops in the left abdomen, given history may reflect early small bowel obstruction. Electronically Signed   By: Jeb Levering M.D.   On: 09/01/2015 01:52    Assessment & Plan:   Latasha Wood was seen today for diabetes and hypertension.  Diagnoses and all orders for this visit:  SBO (small bowel obstruction) ([place])- based on her ROS and exam this has resolved, will recheck plain films to be certain  -     DG Abd Acute W/Chest; Future  Essential hypertension- her BP is well controlled  Anxiety -     ALPRAZolam (XANAX) 0.25 MG tablet; Take 1 tablet (0.25 mg total)  by mouth 2 (two) times daily as needed for anxiety.  Mastoid pain, left- I have ordered an MRI to see if there is a lesion to explain her pain -     MR Brain Wo Contrast; Future  Type 2 diabetes mellitus with complication, without long-term current use of insulin ([place])- her A1C is 6.6%, she does not need a medication but she does agree to work on her lifestyle modifications   I have discontinued Latasha Wood's ondansetron. I am also having her maintain her calcium-vitamin D, mesalamine, potassium chloride SA, multivitamin with minerals, aspirin, predniSONE, BENICAR HCT, and ALPRAZolam.  Meds ordered this encounter  Medications  . ALPRAZolam (XANAX) 0.25 MG tablet    Sig: Take 1 tablet (0.25 mg total) by mouth 2 (two) times daily as needed for anxiety.    Dispense:  60 tablet    Refill:  3     Follow-up: Return in about 4 months (around 01/06/2016).  Scarlette Calico, MD

## 2015-09-18 ENCOUNTER — Encounter (HOSPITAL_COMMUNITY): Payer: Self-pay

## 2015-09-18 ENCOUNTER — Encounter (HOSPITAL_COMMUNITY)
Admission: RE | Admit: 2015-09-18 | Discharge: 2015-09-18 | Disposition: A | Discharge status: Home / Self Care | Payer: 59 | Source: Ambulatory Visit | Attending: General Surgery | Admitting: General Surgery

## 2015-09-18 DIAGNOSIS — Z01818 Encounter for other preprocedural examination: Secondary | ICD-10-CM | POA: Insufficient documentation

## 2015-09-18 DIAGNOSIS — K5190 Ulcerative colitis, unspecified, without complications: Secondary | ICD-10-CM | POA: Diagnosis not present

## 2015-09-18 HISTORY — DX: Anesthesia of skin: R20.2

## 2015-09-18 HISTORY — DX: Anesthesia of skin: R20.0

## 2015-09-18 HISTORY — DX: Reserved for inherently not codable concepts without codable children: IMO0001

## 2015-09-18 LAB — TYPE AND SCREEN
ABO/RH(D): B POS
Antibody Screen: NEGATIVE
Weak D: POSITIVE

## 2015-09-18 LAB — CBC
HCT: 35.4 % — ABNORMAL LOW (ref 36.0–46.0)
HEMOGLOBIN: 11.6 g/dL — AB (ref 12.0–15.0)
MCH: 29.2 pg (ref 26.0–34.0)
MCHC: 32.8 g/dL (ref 30.0–36.0)
MCV: 89.2 fL (ref 78.0–100.0)
PLATELETS: 370 10*3/uL (ref 150–400)
RBC: 3.97 MIL/uL (ref 3.87–5.11)
RDW: 13.3 % (ref 11.5–15.5)
WBC: 5.6 10*3/uL (ref 4.0–10.5)

## 2015-09-18 LAB — BASIC METABOLIC PANEL
Anion gap: 7 (ref 5–15)
BUN: 16 mg/dL (ref 6–20)
CALCIUM: 9 mg/dL (ref 8.9–10.3)
CO2: 29 mmol/L (ref 22–32)
CREATININE: 0.98 mg/dL (ref 0.44–1.00)
Chloride: 103 mmol/L (ref 101–111)
GFR calc non Af Amer: >60 mL/min (ref >60)
Glucose, Bld: 96 mg/dL (ref 65–99)
Potassium: 3.5 mmol/L (ref 3.5–5.1)
SODIUM: 139 mmol/L (ref 135–145)

## 2015-09-18 LAB — ABO/RH
ABO/RH(D): B POS
ABO/RH(D): B POS

## 2015-09-18 NOTE — Progress Notes (Signed)
EKG epic 09/01/2015 ECHO epic 08/01/2015

## 2015-09-18 NOTE — Patient Instructions (Signed)
Paiten LORINA DUFFNER  09/18/2015   Your procedure is scheduled on: Wednesday September 25, 2015   Report to [facility] Main  Entrance take [place]  elevators to 3rd floor to  [place] at 9:30 AM.  Call this number if you have problems the morning of surgery [phone]   Remember: ONLY 1 PERSON MAY GO WITH YOU TO SHORT STAY TO GET  READY MORNING OF [place].  Do not eat food or drink liquids :After Midnight.     Take these medicines the morning of surgery with A SIP OF WATER: ALprazolam (Xanax); Prednisone   (NO ASPIRIN OR HERAL MEDICATIONS 5 DAYS PRIOR TO SURGERY)               FOLLOW SURGEONS INSTRUCTION IN REGARDS TO BOWEL PREPARATION PRIOR TO SURGERY.                               You may not have any metal on your body including hair pins and              piercings  Do not wear jewelry, make-up, lotions, powders or perfumes, deodorant             Do not wear nail polish.  Do not shave  48 hours prior to surgery.              Do not bring valuables to the hospital. [place].  Contacts, dentures or bridgework may not be worn into surgery.  Leave suitcase in the car. After surgery it may be brought to your room. _____________________________________________________________________             [facility] - Preparing for Surgery Before surgery, you can play an important role.  Because skin is not sterile, your skin needs to be as free of germs as possible.  You can reduce the number of germs on your skin by washing with CHG (chlorahexidine gluconate) soap before surgery.  CHG is an antiseptic cleaner which kills germs and bonds with the skin to continue killing germs even after washing. Please DO NOT use if you have an allergy to CHG or antibacterial soaps.  If your skin becomes reddened/irritated stop using the CHG and inform your nurse when you arrive at Short Stay. Do not shave (including legs and  underarms) for at least 48 hours prior to the first CHG shower.  You may shave your face/neck. Please follow these instructions carefully:  1.  Shower with CHG Soap the night before surgery and the  morning of Surgery.  2.  If you choose to wash your hair, wash your hair first as usual with your  normal  shampoo.  3.  After you shampoo, rinse your hair and body thoroughly to remove the  shampoo.                           4.  Use CHG as you would any other liquid soap.  You can apply chg directly  to the skin and wash                       Gently with a scrungie or clean washcloth.  5.  Apply the CHG Soap to your body ONLY FROM THE NECK DOWN.   Do not use on face/ open                           Wound or open sores. Avoid contact with eyes, ears mouth and genitals (private parts).                       Wash face,  Genitals (private parts) with your normal soap.             6.  Wash thoroughly, paying special attention to the area where your surgery  will be performed.  7.  Thoroughly rinse your body with warm water from the neck down.  8.  DO NOT shower/wash with your normal soap after using and rinsing off  the CHG Soap.                9.  Pat yourself dry with a clean towel.            10.  Wear clean pajamas.            11.  Place clean sheets on your bed the night of your first shower and do not  sleep with pets. Day of Surgery : Do not apply any lotions/deodorants the morning of surgery.  Please wear clean clothes to the hospital/surgery center.  FAILURE TO FOLLOW THESE INSTRUCTIONS MAY RESULT IN THE CANCELLATION OF YOUR SURGERY PATIENT SIGNATURE_________________________________  NURSE SIGNATURE__________________________________  ________________________________________________________________________

## 2015-09-18 NOTE — Consult Note (Signed)
WOC ostomy consult note Patient seen per Dr. Marcello Moores' request for presurgical education and ostomy site selection.  Patient is to have a permanent ileostomy for ulcerative colitis on Wednesday, November 16. Abdomen assessed in the sitting and standing position and site selected in the LUQ, 6.5cm to the right of the umbilicus and 2cm above in a flat plane and within the patient's field of vision. Abdomen is cleaned using two CHG cloths and allowed to dry.  Site is marked with a surgical site marking pen and covered with a thin film transparent dressing. Education provided: Patient and I discuss her knowledge of ulcerative colitis.  She has been a patient of Dr. Sydell Axon L. Brodie for decades and is well-informed of the risks of greater than 10 years with this disease.  She has spoken extensively with Dr,. Marcello Moores and has done research on her own.  Her fears are around odor and limitation and she is reassured that fit and formulation of pouching supplies will keep her odor free and leak-free.  Our mutual goal is predictable wear times. Diet and activity, including intimacy with her husband post operatively are discussed. A sample pouching system is demonstrated.  Patient elects not to take a booklet home with her today but is eager to participate in self-care instruction when able.  She is now hopeful and looking forward to the holiday. She is looking forward to resuming senior tennis in the Spring. [place] nursing team will follow, and will remain available to this patient, the nursing, surgical and medical teams post operatively.   Thank you for this consultation and the opportunity to meet and mark this patient preoperatively. Latasha Flakes, MSN, RN, Latasha Wood, Latasha Wood  Pager# [phone]

## 2015-09-20 ENCOUNTER — Encounter (HOSPITAL_COMMUNITY): Payer: 59

## 2015-09-25 ENCOUNTER — Inpatient Hospital Stay (HOSPITAL_COMMUNITY): Payer: 59 | Admitting: Anesthesiology

## 2015-09-25 ENCOUNTER — Encounter (HOSPITAL_COMMUNITY): Payer: Self-pay | Admitting: *Deleted

## 2015-09-25 ENCOUNTER — Inpatient Hospital Stay (HOSPITAL_COMMUNITY)
Admission: RE | Admit: 2015-09-25 | Discharge: 2015-10-01 | DRG: 330 | Disposition: A | Discharge status: Home / Self Care | Payer: 59 | Source: Ambulatory Visit | Attending: General Surgery | Admitting: General Surgery

## 2015-09-25 ENCOUNTER — Encounter (HOSPITAL_COMMUNITY)
Admission: RE | Disposition: A | Discharge status: Home / Self Care | Payer: Self-pay | Source: Ambulatory Visit | Attending: General Surgery

## 2015-09-25 DIAGNOSIS — E669 Obesity, unspecified: Secondary | ICD-10-CM | POA: Diagnosis present

## 2015-09-25 DIAGNOSIS — N736 Female pelvic peritoneal adhesions (postinfective): Secondary | ICD-10-CM | POA: Diagnosis present

## 2015-09-25 DIAGNOSIS — K5190 Ulcerative colitis, unspecified, without complications: Principal | ICD-10-CM | POA: Diagnosis present

## 2015-09-25 DIAGNOSIS — Z01812 Encounter for preprocedural laboratory examination: Secondary | ICD-10-CM | POA: Diagnosis not present

## 2015-09-25 DIAGNOSIS — Z6834 Body mass index (BMI) 34.0-34.9, adult: Secondary | ICD-10-CM

## 2015-09-25 DIAGNOSIS — Z803 Family history of malignant neoplasm of breast: Secondary | ICD-10-CM | POA: Diagnosis not present

## 2015-09-25 DIAGNOSIS — Z8249 Family history of ischemic heart disease and other diseases of the circulatory system: Secondary | ICD-10-CM | POA: Diagnosis not present

## 2015-09-25 DIAGNOSIS — D62 Acute posthemorrhagic anemia: Secondary | ICD-10-CM | POA: Diagnosis not present

## 2015-09-25 DIAGNOSIS — R109 Unspecified abdominal pain: Secondary | ICD-10-CM | POA: Diagnosis present

## 2015-09-25 DIAGNOSIS — Z7952 Long term (current) use of systemic steroids: Secondary | ICD-10-CM

## 2015-09-25 DIAGNOSIS — E119 Type 2 diabetes mellitus without complications: Secondary | ICD-10-CM | POA: Diagnosis present

## 2015-09-25 DIAGNOSIS — Z833 Family history of diabetes mellitus: Secondary | ICD-10-CM

## 2015-09-25 DIAGNOSIS — D72829 Elevated white blood cell count, unspecified: Secondary | ICD-10-CM | POA: Diagnosis present

## 2015-09-25 HISTORY — PX: ILEO LOOP DIVERSION: SHX1780

## 2015-09-25 HISTORY — PX: LAPAROSCOPIC SMALL BOWEL RESECTION: SHX5929

## 2015-09-25 HISTORY — PX: ROBOTIC ASSISTED LAPAROSCOPIC LYSIS OF ADHESION: SHX6080

## 2015-09-25 LAB — GLUCOSE, CAPILLARY
GLUCOSE-CAPILLARY: 79 mg/dL (ref 65–99)
Glucose-Capillary: 158 mg/dL — ABNORMAL HIGH (ref 65–99)

## 2015-09-25 LAB — TYPE AND SCREEN
ABO/RH(D): B POS
ANTIBODY SCREEN: NEGATIVE
Weak D: POSITIVE

## 2015-09-25 SURGERY — COLECTOMY, PARTIAL, ROBOT-ASSISTED, LAPAROSCOPIC
Anesthesia: General | Site: Abdomen

## 2015-09-25 MED ORDER — FENTANYL CITRATE (PF) 100 MCG/2ML IJ SOLN
INTRAMUSCULAR | Status: AC
  Filled 2015-09-25: qty 2

## 2015-09-25 MED ORDER — ACETAMINOPHEN 500 MG PO TABS
1000.0000 mg | ORAL_TABLET | Freq: Four times a day (QID) | ORAL | Status: AC
  Administered 2015-09-26 (×2): 1000 mg via ORAL
  Filled 2015-09-25 (×2): qty 2

## 2015-09-25 MED ORDER — LACTATED RINGERS IV SOLN
INTRAVENOUS | Status: DC
  Administered 2015-09-25 (×2): via INTRAVENOUS
  Administered 2015-09-25: 1000 mL via INTRAVENOUS

## 2015-09-25 MED ORDER — 0.9 % SODIUM CHLORIDE (POUR BTL) OPTIME
TOPICAL | Status: DC | PRN
  Administered 2015-09-25: 3000 mL

## 2015-09-25 MED ORDER — FENTANYL CITRATE (PF) 100 MCG/2ML IJ SOLN
25.0000 ug | INTRAMUSCULAR | Status: DC | PRN
  Administered 2015-09-25: 50 ug via INTRAVENOUS

## 2015-09-25 MED ORDER — SODIUM CHLORIDE 0.9 % IJ SOLN
INTRAMUSCULAR | Status: AC
  Filled 2015-09-25: qty 10

## 2015-09-25 MED ORDER — HYDROMORPHONE HCL 1 MG/ML IJ SOLN: 0.2500 mg | INTRAMUSCULAR | Status: DC | PRN

## 2015-09-25 MED ORDER — IRBESARTAN 300 MG PO TABS
300.0000 mg | ORAL_TABLET | Freq: Every day | ORAL | Status: DC
  Administered 2015-09-26 – 2015-10-01 (×6): 300 mg via ORAL
  Filled 2015-09-25 (×7): qty 1

## 2015-09-25 MED ORDER — SUGAMMADEX SODIUM 200 MG/2ML IV SOLN
INTRAVENOUS | Status: DC | PRN
  Administered 2015-09-25: 200 mg via INTRAVENOUS

## 2015-09-25 MED ORDER — ALBUMIN HUMAN 5 % IV SOLN
INTRAVENOUS | Status: DC | PRN
  Administered 2015-09-25: 16:00:00 via INTRAVENOUS

## 2015-09-25 MED ORDER — FENTANYL CITRATE (PF) 250 MCG/5ML IJ SOLN
INTRAMUSCULAR | Status: AC
Start: 2015-09-25 — End: 2015-09-25
  Filled 2015-09-25: qty 5

## 2015-09-25 MED ORDER — ROCURONIUM BROMIDE 100 MG/10ML IV SOLN
INTRAVENOUS | Status: AC
  Filled 2015-09-25: qty 1

## 2015-09-25 MED ORDER — ENOXAPARIN SODIUM 40 MG/0.4ML ~~LOC~~ SOLN
40.0000 mg | SUBCUTANEOUS | Status: DC
  Administered 2015-09-26 – 2015-10-01 (×6): 40 mg via SUBCUTANEOUS
  Filled 2015-09-25 (×8): qty 0.4

## 2015-09-25 MED ORDER — HYDROMORPHONE HCL 1 MG/ML IJ SOLN
INTRAMUSCULAR | Status: AC
  Filled 2015-09-25: qty 1

## 2015-09-25 MED ORDER — MIDAZOLAM HCL 2 MG/2ML IJ SOLN
INTRAMUSCULAR | Status: AC
  Filled 2015-09-25: qty 2

## 2015-09-25 MED ORDER — DIPHENHYDRAMINE HCL 12.5 MG/5ML PO ELIX: 12.5000 mg | ORAL_SOLUTION | Freq: Four times a day (QID) | ORAL | Status: DC | PRN

## 2015-09-25 MED ORDER — MORPHINE SULFATE 2 MG/ML IV SOLN
INTRAVENOUS | Status: AC
  Filled 2015-09-25: qty 25

## 2015-09-25 MED ORDER — ONDANSETRON HCL 4 MG/2ML IJ SOLN
INTRAMUSCULAR | Status: DC | PRN
  Administered 2015-09-25: 4 mg via INTRAVENOUS

## 2015-09-25 MED ORDER — DIPHENHYDRAMINE HCL 50 MG/ML IJ SOLN: 12.5000 mg | Freq: Four times a day (QID) | INTRAMUSCULAR | Status: DC | PRN

## 2015-09-25 MED ORDER — LACTATED RINGERS IV SOLN: INTRAVENOUS | Status: DC

## 2015-09-25 MED ORDER — SODIUM CHLORIDE 0.9 % IJ SOLN: 9.0000 mL | INTRAMUSCULAR | Status: DC | PRN

## 2015-09-25 MED ORDER — LACTATED RINGERS IR SOLN
Status: DC | PRN
  Administered 2015-09-25: 1

## 2015-09-25 MED ORDER — NALOXONE HCL 0.4 MG/ML IJ SOLN: 0.4000 mg | INTRAMUSCULAR | Status: DC | PRN

## 2015-09-25 MED ORDER — HYDROCHLOROTHIAZIDE 12.5 MG PO CAPS
12.5000 mg | ORAL_CAPSULE | Freq: Every day | ORAL | Status: DC
  Administered 2015-09-26 – 2015-10-01 (×6): 12.5 mg via ORAL
  Filled 2015-09-25 (×6): qty 1

## 2015-09-25 MED ORDER — HYDROMORPHONE HCL 1 MG/ML IJ SOLN
0.2500 mg | INTRAMUSCULAR | Status: DC | PRN
  Administered 2015-09-25 (×4): 0.5 mg via INTRAVENOUS

## 2015-09-25 MED ORDER — CEFOTETAN DISODIUM-DEXTROSE 2-2.08 GM-% IV SOLR
INTRAVENOUS | Status: AC
  Filled 2015-09-25: qty 50

## 2015-09-25 MED ORDER — EPHEDRINE SULFATE 50 MG/ML IJ SOLN
INTRAMUSCULAR | Status: AC
  Filled 2015-09-25: qty 1

## 2015-09-25 MED ORDER — ROCURONIUM BROMIDE 100 MG/10ML IV SOLN
INTRAVENOUS | Status: DC | PRN
  Administered 2015-09-25: 10 mg via INTRAVENOUS
  Administered 2015-09-25: 50 mg via INTRAVENOUS
  Administered 2015-09-25 (×5): 10 mg via INTRAVENOUS

## 2015-09-25 MED ORDER — PROMETHAZINE HCL 25 MG/ML IJ SOLN: 6.2500 mg | INTRAMUSCULAR | Status: DC | PRN

## 2015-09-25 MED ORDER — BUPIVACAINE-EPINEPHRINE (PF) 0.25% -1:200000 IJ SOLN
INTRAMUSCULAR | Status: AC
  Filled 2015-09-25: qty 30

## 2015-09-25 MED ORDER — ALVIMOPAN 12 MG PO CAPS
12.0000 mg | ORAL_CAPSULE | Freq: Two times a day (BID) | ORAL | Status: DC
Start: 2015-09-26 — End: 2015-09-29
  Administered 2015-09-26 – 2015-09-28 (×6): 12 mg via ORAL
  Filled 2015-09-25 (×8): qty 1

## 2015-09-25 MED ORDER — ALPRAZOLAM 0.25 MG PO TABS: 0.2500 mg | ORAL_TABLET | Freq: Two times a day (BID) | ORAL | Status: DC | PRN

## 2015-09-25 MED ORDER — EPHEDRINE SULFATE 50 MG/ML IJ SOLN
INTRAMUSCULAR | Status: DC | PRN
  Administered 2015-09-25: 5 mg via INTRAVENOUS
  Administered 2015-09-25: 10 mg via INTRAVENOUS
  Administered 2015-09-25 (×6): 5 mg via INTRAVENOUS

## 2015-09-25 MED ORDER — HEPARIN SODIUM (PORCINE) 5000 UNIT/ML IJ SOLN
5000.0000 [IU] | Freq: Once | INTRAMUSCULAR | Status: AC
  Administered 2015-09-25: 5000 [IU] via SUBCUTANEOUS
  Filled 2015-09-25: qty 1

## 2015-09-25 MED ORDER — CEFOTETAN DISODIUM-DEXTROSE 2-2.08 GM-% IV SOLR
INTRAVENOUS | Status: AC
Start: 2015-09-25 — End: 2015-09-25
  Filled 2015-09-25: qty 50

## 2015-09-25 MED ORDER — KCL IN DEXTROSE-NACL 20-5-0.45 MEQ/L-%-% IV SOLN
INTRAVENOUS | Status: DC
  Administered 2015-09-25 – 2015-09-30 (×7): via INTRAVENOUS
  Filled 2015-09-25 (×15): qty 1000

## 2015-09-25 MED ORDER — SUCCINYLCHOLINE CHLORIDE 20 MG/ML IJ SOLN
INTRAMUSCULAR | Status: DC | PRN
  Administered 2015-09-25: 100 mg via INTRAVENOUS

## 2015-09-25 MED ORDER — CEFOTETAN DISODIUM-DEXTROSE 2-2.08 GM-% IV SOLR
2.0000 g | INTRAVENOUS | Status: AC
  Administered 2015-09-25: 2 g via INTRAVENOUS

## 2015-09-25 MED ORDER — ONDANSETRON HCL 4 MG/2ML IJ SOLN
INTRAMUSCULAR | Status: AC
Start: 2015-09-25 — End: 2015-09-25
  Filled 2015-09-25: qty 2

## 2015-09-25 MED ORDER — MORPHINE SULFATE 2 MG/ML IV SOLN
INTRAVENOUS | Status: DC
  Administered 2015-09-25: 2 mg via INTRAVENOUS
  Administered 2015-09-26: 7.5 mg via INTRAVENOUS
  Administered 2015-09-26: 04:00:00 via INTRAVENOUS
  Administered 2015-09-26: 1.5 mg via INTRAVENOUS
  Administered 2015-09-26: 4.5 mg via INTRAVENOUS
  Administered 2015-09-26: 0.5 mg via INTRAVENOUS
  Administered 2015-09-26: 34.8 mg via INTRAVENOUS
  Administered 2015-09-27: 7.09 mg via INTRAVENOUS
  Administered 2015-09-27: 1.7 mg via INTRAVENOUS
  Administered 2015-09-27 (×3): 3 mg via INTRAVENOUS
  Administered 2015-09-27: 1.5 mg via INTRAVENOUS
  Administered 2015-09-28: 3 mg via INTRAVENOUS
  Administered 2015-09-28: 8.15 mg via INTRAVENOUS
  Administered 2015-09-28: 6 mg via INTRAVENOUS
  Administered 2015-09-28: 1.5 mg via INTRAVENOUS
  Administered 2015-09-28: 0 mg via INTRAVENOUS
  Administered 2015-09-29: 3 mg via INTRAVENOUS
  Administered 2015-09-29 (×2): 1.5 mg via INTRAVENOUS
  Filled 2015-09-25 (×2): qty 25

## 2015-09-25 MED ORDER — KCL IN DEXTROSE-NACL 20-5-0.45 MEQ/L-%-% IV SOLN
INTRAVENOUS | Status: AC
Start: 2015-09-25 — End: 2015-09-26
  Filled 2015-09-25: qty 1000

## 2015-09-25 MED ORDER — HYDROMORPHONE HCL 1 MG/ML IJ SOLN
INTRAMUSCULAR | Status: DC | PRN
  Administered 2015-09-25 (×2): 1 mg via INTRAVENOUS

## 2015-09-25 MED ORDER — HYDROMORPHONE HCL 1 MG/ML IJ SOLN
0.2500 mg | INTRAMUSCULAR | Status: DC | PRN
  Administered 2015-09-25: 0.5 mg via INTRAVENOUS

## 2015-09-25 MED ORDER — HYDROMORPHONE HCL 1 MG/ML IJ SOLN
INTRAMUSCULAR | Status: AC
  Administered 2015-09-25: 0.5 mg via INTRAVENOUS
  Filled 2015-09-25: qty 1

## 2015-09-25 MED ORDER — PREDNISONE 10 MG PO TABS
10.0000 mg | ORAL_TABLET | Freq: Every day | ORAL | Status: DC
  Administered 2015-09-26 – 2015-10-01 (×6): 10 mg via ORAL
  Filled 2015-09-25 (×9): qty 1

## 2015-09-25 MED ORDER — DEXTROSE 5 % IV SOLN
2.0000 g | INTRAVENOUS | Status: DC | PRN
  Administered 2015-09-25: 2 g via INTRAVENOUS

## 2015-09-25 MED ORDER — PROPOFOL 10 MG/ML IV BOLUS
INTRAVENOUS | Status: DC | PRN
  Administered 2015-09-25: 120 mg via INTRAVENOUS

## 2015-09-25 MED ORDER — FENTANYL CITRATE (PF) 100 MCG/2ML IJ SOLN
INTRAMUSCULAR | Status: DC | PRN
  Administered 2015-09-25 (×7): 50 ug via INTRAVENOUS
  Administered 2015-09-25: 100 ug via INTRAVENOUS

## 2015-09-25 MED ORDER — LIDOCAINE HCL (CARDIAC) 20 MG/ML IV SOLN
INTRAVENOUS | Status: AC
  Filled 2015-09-25: qty 5

## 2015-09-25 MED ORDER — SUGAMMADEX SODIUM 200 MG/2ML IV SOLN
INTRAVENOUS | Status: AC
Start: 2015-09-25 — End: 2015-09-25
  Filled 2015-09-25: qty 2

## 2015-09-25 MED ORDER — PROPOFOL 10 MG/ML IV BOLUS
INTRAVENOUS | Status: AC
  Filled 2015-09-25: qty 20

## 2015-09-25 MED ORDER — ONDANSETRON HCL 4 MG/2ML IJ SOLN: 4.0000 mg | Freq: Four times a day (QID) | INTRAMUSCULAR | Status: DC | PRN

## 2015-09-25 MED ORDER — MIDAZOLAM HCL 5 MG/5ML IJ SOLN
INTRAMUSCULAR | Status: DC | PRN
  Administered 2015-09-25: 2 mg via INTRAVENOUS

## 2015-09-25 MED ORDER — HYDROMORPHONE HCL 2 MG/ML IJ SOLN
INTRAMUSCULAR | Status: AC
  Filled 2015-09-25: qty 1

## 2015-09-25 MED ORDER — ALBUMIN HUMAN 5 % IV SOLN
INTRAVENOUS | Status: AC
  Filled 2015-09-25: qty 250

## 2015-09-25 MED ORDER — LACTATED RINGERS IV SOLN
INTRAVENOUS | Status: DC | PRN
  Administered 2015-09-25 (×2): via INTRAVENOUS

## 2015-09-25 MED ORDER — DEXTROSE 5 % IV SOLN
2.0000 g | Freq: Two times a day (BID) | INTRAVENOUS | Status: AC
  Administered 2015-09-26: 2 g via INTRAVENOUS
  Filled 2015-09-25 (×2): qty 2

## 2015-09-25 MED ORDER — OLMESARTAN MEDOXOMIL-HCTZ 40-12.5 MG PO TABS: 1.0000 | ORAL_TABLET | Freq: Every day | ORAL | Status: DC

## 2015-09-25 MED ORDER — DEXAMETHASONE SODIUM PHOSPHATE 10 MG/ML IJ SOLN
INTRAMUSCULAR | Status: AC
  Filled 2015-09-25: qty 1

## 2015-09-25 MED ORDER — BUPIVACAINE-EPINEPHRINE 0.25% -1:200000 IJ SOLN
INTRAMUSCULAR | Status: DC | PRN
  Administered 2015-09-25: 21 mL

## 2015-09-25 MED ORDER — LIDOCAINE HCL (CARDIAC) 20 MG/ML IV SOLN
INTRAVENOUS | Status: DC | PRN
  Administered 2015-09-25: 50 mg via INTRAVENOUS

## 2015-09-25 SURGICAL SUPPLY — 101 items
BLADE EXTENDED COATED 6.5IN (ELECTRODE) IMPLANT
CANNULA REDUC XI 12-8 STAPL (CANNULA) ×1
CANNULA REDUC XI 12-8MM STAPL (CANNULA) ×1
CANNULA REDUCER 12-8 DVNC XI (CANNULA) ×1 IMPLANT
CELLS DAT CNTRL 66122 CELL SVR (MISCELLANEOUS) IMPLANT
CLIP LIGATING HEM O LOK PURPLE (MISCELLANEOUS) ×3 IMPLANT
CLIP LIGATING HEMOLOK MED (MISCELLANEOUS) IMPLANT
COUNTER NEEDLE 20 DBL MAG RED (NEEDLE) ×6 IMPLANT
COVER MAYO STAND STRL (DRAPES) ×6 IMPLANT
COVER SURGICAL LIGHT HANDLE (MISCELLANEOUS) ×3 IMPLANT
COVER TIP SHEARS 8 DVNC (MISCELLANEOUS) ×1 IMPLANT
COVER TIP SHEARS 8MM DA VINCI (MISCELLANEOUS) ×2
DECANTER SPIKE VIAL GLASS SM (MISCELLANEOUS) ×3 IMPLANT
DEVICE TROCAR PUNCTURE CLOSURE (ENDOMECHANICALS) ×3 IMPLANT
DRAIN CHANNEL 19F RND (DRAIN) ×3 IMPLANT
DRAPE ARM DVNC X/XI (DISPOSABLE) ×4 IMPLANT
DRAPE COLUMN DVNC XI (DISPOSABLE) ×1 IMPLANT
DRAPE DA VINCI XI ARM (DISPOSABLE) ×8
DRAPE DA VINCI XI COLUMN (DISPOSABLE) ×2
DRAPE SURG IRRIG POUCH 19X23 (DRAPES) ×3 IMPLANT
DRSG OPSITE POSTOP 4X10 (GAUZE/BANDAGES/DRESSINGS) IMPLANT
DRSG OPSITE POSTOP 4X6 (GAUZE/BANDAGES/DRESSINGS) IMPLANT
DRSG OPSITE POSTOP 4X8 (GAUZE/BANDAGES/DRESSINGS) IMPLANT
DRSG TEGADERM 6X8 (GAUZE/BANDAGES/DRESSINGS) ×9 IMPLANT
DRSG TEGADERM 8X12 (GAUZE/BANDAGES/DRESSINGS) ×3 IMPLANT
ELECT PENCIL ROCKER SW 15FT (MISCELLANEOUS) ×9 IMPLANT
ELECT REM PT RETURN 15FT ADLT (MISCELLANEOUS) ×3 IMPLANT
ENDOLOOP SUT PDS II  0 18 (SUTURE)
ENDOLOOP SUT PDS II 0 18 (SUTURE) IMPLANT
EVACUATOR SILICONE 100CC (DRAIN) ×3 IMPLANT
GAUZE SPONGE 4X4 12PLY STRL (GAUZE/BANDAGES/DRESSINGS) ×3 IMPLANT
GLOVE BIO SURGEON STRL SZ 6.5 (GLOVE) ×8 IMPLANT
GLOVE BIO SURGEONS STRL SZ 6.5 (GLOVE) ×4
GLOVE BIOGEL PI IND STRL 7.0 (GLOVE) ×5 IMPLANT
GLOVE BIOGEL PI IND STRL 7.5 (GLOVE) ×1 IMPLANT
GLOVE BIOGEL PI INDICATOR 7.0 (GLOVE) ×10
GLOVE BIOGEL PI INDICATOR 7.5 (GLOVE) ×2
GLOVE ECLIPSE 8.0 STRL XLNG CF (GLOVE) ×3 IMPLANT
GLOVE SURG SS PI 7.5 STRL IVOR (GLOVE) ×6 IMPLANT
GOWN STRL REUS W/TWL 2XL LVL3 (GOWN DISPOSABLE) ×9 IMPLANT
GOWN STRL REUS W/TWL XL LVL3 (GOWN DISPOSABLE) ×24 IMPLANT
HOLDER FOLEY CATH W/STRAP (MISCELLANEOUS) ×3 IMPLANT
LEGGING LITHOTOMY PAIR STRL (DRAPES) ×3 IMPLANT
NEEDLE INSUFFLATION 14GA 120MM (NEEDLE) ×3 IMPLANT
PACK CARDIOVASCULAR III (CUSTOM PROCEDURE TRAY) ×3 IMPLANT
PACK COLON (CUSTOM PROCEDURE TRAY) ×3 IMPLANT
PEN SKIN MARKING BROAD (MISCELLANEOUS) ×3 IMPLANT
PORT LAP GEL ALEXIS MED 5-9CM (MISCELLANEOUS) ×3 IMPLANT
RELOAD PROXIMATE 75MM BLUE (ENDOMECHANICALS) ×6 IMPLANT
RETRACTOR LONE STAR DISPOSABLE (INSTRUMENTS) ×3 IMPLANT
RETRACTOR STAY HOOK 5MM (MISCELLANEOUS) ×3 IMPLANT
RTRCTR WOUND ALEXIS 18CM MED (MISCELLANEOUS)
SCISSORS LAP 5X35 DISP (ENDOMECHANICALS) ×3 IMPLANT
SEAL CANN UNIV 5-8 DVNC XI (MISCELLANEOUS) ×3 IMPLANT
SEAL XI 5MM-8MM UNIVERSAL (MISCELLANEOUS) ×6
SEALER VESSEL DA VINCI XI (MISCELLANEOUS) ×2
SEALER VESSEL EXT DVNC XI (MISCELLANEOUS) ×1 IMPLANT
SET IRRIG TUBING LAPAROSCOPIC (IRRIGATION / IRRIGATOR) ×3 IMPLANT
SLEEVE XCEL OPT CAN 5 100 (ENDOMECHANICALS) IMPLANT
SOLUTION ELECTROLUBE (MISCELLANEOUS) ×3 IMPLANT
SPONGE LAP 18X18 X RAY DECT (DISPOSABLE) ×9 IMPLANT
STAPLER 45 BLU RELOAD XI (STAPLE) ×3 IMPLANT
STAPLER 45 BLUE RELOAD XI (STAPLE) ×6
STAPLER 45 GREEN RELOAD XI (STAPLE)
STAPLER 45 GRN RELOAD XI (STAPLE) IMPLANT
STAPLER CANNULA SEAL DVNC XI (STAPLE) ×1 IMPLANT
STAPLER CANNULA SEAL XI (STAPLE) ×2
STAPLER GUN LINEAR PROX 60 (STAPLE) ×3 IMPLANT
STAPLER PROXIMATE 75MM BLUE (STAPLE) ×3 IMPLANT
STAPLER SHEATH (SHEATH) ×2
STAPLER SHEATH ENDOWRIST DVNC (SHEATH) ×1 IMPLANT
STAPLER VISISTAT 35W (STAPLE) ×3 IMPLANT
SUT ETHILON 2 0 PS N (SUTURE) ×3 IMPLANT
SUT NOVA NAB DX-16 0-1 5-0 T12 (SUTURE) ×15 IMPLANT
SUT PDS AB 1 CTX 36 (SUTURE) IMPLANT
SUT PDS AB 1 TP1 96 (SUTURE) IMPLANT
SUT PROLENE 2 0 KS (SUTURE) ×3 IMPLANT
SUT SILK 2 0 (SUTURE) ×3
SUT SILK 2 0 SH CR/8 (SUTURE) ×3 IMPLANT
SUT SILK 2-0 18XBRD TIE 12 (SUTURE) ×1 IMPLANT
SUT SILK 3 0 (SUTURE) ×3
SUT SILK 3 0 SH CR/8 (SUTURE) ×6 IMPLANT
SUT SILK 3-0 18XBRD TIE 12 (SUTURE) ×1 IMPLANT
SUT V-LOC BARB 180 2/0GR6 GS22 (SUTURE) ×3
SUT VIC AB 2-0 SH 18 (SUTURE) ×18 IMPLANT
SUT VIC AB 2-0 SH 27 (SUTURE) ×6
SUT VIC AB 2-0 SH 27X BRD (SUTURE) ×2 IMPLANT
SUT VIC AB 3-0 SH 18 (SUTURE) IMPLANT
SUT VIC AB 4-0 PS2 27 (SUTURE) ×9 IMPLANT
SUTURE V-LC BRB 180 2/0GR6GS22 (SUTURE) ×1 IMPLANT
SYRINGE 10CC LL (SYRINGE) ×3 IMPLANT
SYS LAPSCP GELPORT 120MM (MISCELLANEOUS)
SYSTEM LAPSCP GELPORT 120MM (MISCELLANEOUS) IMPLANT
TOWEL OR 17X26 10 PK STRL BLUE (TOWEL DISPOSABLE) ×3 IMPLANT
TOWEL OR NON WOVEN STRL DISP B (DISPOSABLE) ×3 IMPLANT
TRAY FOLEY W/METER SILVER 14FR (SET/KITS/TRAYS/PACK) ×3 IMPLANT
TROCAR BLADELESS OPT 5 100 (ENDOMECHANICALS) ×3 IMPLANT
TUBING CONNECTING 10 (TUBING) IMPLANT
TUBING CONNECTING 10' (TUBING)
TUBING FILTER THERMOFLATOR (ELECTROSURGICAL) ×3 IMPLANT
YANKAUER SUCT BULB TIP NO VENT (SUCTIONS) ×3 IMPLANT

## 2015-09-25 NOTE — Anesthesia Preprocedure Evaluation (Addendum)
Anesthesia Evaluation  Patient identified by MRN, date of birth, ID band Patient awake    Reviewed: Allergy & Precautions, NPO status , Patient's Chart, lab work & pertinent test results  Airway Mallampati: II  TM Distance: >3 FB Neck ROM: Full    Dental no notable dental hx.    Pulmonary shortness of breath,    Pulmonary exam normal breath sounds clear to auscultation       Cardiovascular hypertension, Pt. on medications Normal cardiovascular exam Rhythm:Regular Rate:Normal     Neuro/Psych Anxiety negative neurological ROS     GI/Hepatic Neg liver ROS, PUD,   Endo/Other  diabetes, Type 2  Renal/GU negative Renal ROS  negative genitourinary   Musculoskeletal negative musculoskeletal ROS (+)   Abdominal   Peds negative pediatric ROS (+)  Hematology  (+) anemia ,   Anesthesia Other Findings   Reproductive/Obstetrics negative OB ROS                            Anesthesia Physical Anesthesia Plan  ASA: II  Anesthesia Plan: General   Post-op Pain Management:    Induction: Intravenous  Airway Management Planned: Oral ETT  Additional Equipment:   Intra-op Plan:   Post-operative Plan: Extubation in OR  Informed Consent: I have reviewed the patients History and Physical, chart, labs and discussed the procedure including the risks, benefits and alternatives for the proposed anesthesia with the patient or authorized representative who has indicated his/her understanding and acceptance.   Dental advisory given  Plan Discussed with: CRNA  Anesthesia Plan Comments:         Anesthesia Quick Evaluation

## 2015-09-25 NOTE — Anesthesia Postprocedure Evaluation (Signed)
  Anesthesia Post-op Note  Patient: Latasha Wood  Procedure(s) Performed: Procedure(s) (LRB): XI ROBOT PROCTOCOLECTOMY/TOTAL ABDOMINAL COLECTOMY (N/A) XI ROBOTIC ASSISTED LAPAROSCOPIC LYSIS OF ADHESION (N/A) LAPAROSCOPIC BOWEL RESECTION TIMES TWO (N/A) ILEO LOOP COLOSTOMY (N/A)  Patient Location: PACU  Anesthesia Type: General  Level of Consciousness: awake and alert   Airway and Oxygen Therapy: Patient Spontanous Breathing  Post-op Pain: mild  Post-op Assessment: Post-op Vital signs reviewed, Patient's Cardiovascular Status Stable, Respiratory Function Stable, Patent Airway and No signs of Nausea or vomiting  Last Vitals:  Filed Vitals:   09/25/15 2136  BP: 138/75  Pulse: 102  Temp: 36.8 C  Resp: 16    Post-op Vital Signs: stable   Complications: No apparent anesthesia complications

## 2015-09-25 NOTE — H&P (Signed)
The patient is a 59 year old female who presents with abdominal pain. This is a 59 year old female who has been undergoing treatment for ulcerative colitis for more than 30 years. She was being treated by Dr. Olevia Perches. She is here to discuss surgical management of ulcerative colitis. She did see a cardiologist recently regarding her new onset shortness of breath. It was felt to mostly be due to deconditioning.  She currently has 3-4 bowel movements a day with occasional bleeding. She has some crampy abdominal pain and joint pain as well. She is currently on 15 mg of prednisone a day and is off of her Humira right now. She has been off of this for over 6 weeks. Past surgical history is significant for hysterectomy only. She has had a couple recent hospitalizations for small bowel obstruction. Biopsies of her most recent colonoscopy showed no signs of dysplasia.  Problem List/Past Medical Leighton Ruff, MD; 5/78/4696 12:49 PM) CHRONIC ULCERATIVE COLITIS WITHOUT COMPLICATION (E95.28)  Other Problems Leighton Ruff, MD; 02/20/2439 12:49 PM) High blood pressure Hypercholesterolemia Ulcerative Colitis  Past Surgical History Leighton Ruff, MD; 11/11/7251 12:49 PM) Colon Polyp Removal - Colonoscopy Hysterectomy (not due to cancer) - Partial  Diagnostic Studies History Leighton Ruff, MD; 6/64/4034 12:49 PM) Colonoscopy within last year Mammogram within last year Pap Smear >5 years ago  Allergies Mammie Lorenzo, LPN; 7/42/5956 38:75 AM) No Known Drug Allergies 05/14/2015  Medication History Leighton Ruff, MD; 6/43/3295 12:49 PM) Glycopyrrolate (2MG Tablet, Oral) Active. Potassium Chloride Crys ER (20MEQ Tablet ER, Oral) Active. PredniSONE (10MG Tablet, Oral) Active. Oscal 500/200 D-3 (500-200MG-UNIT Tablet, Oral) Active. Ultram (50MG Tablet, Oral as needed) Active. Zofran (4MG Tablet, Oral as needed) Active. Lialda (1.2GM Tablet DR, Oral) Active. Benicar HCT  (40-12.5MG Tablet, Oral) Active. ALPRAZolam (0.25MG Tablet, Oral) Active. Medications Reconciled Neomycin Sulfate (500MG Tablet, 2 (two) Tablet Oral SEE NOTE, Taken starting 07/24/2015) Active. (TAKE TWO TABLETS AT 2 PM, 3 PM, AND 10 PM THE DAY PRIOR TO SURGERY) Flagyl (500MG Tablet, 2 (two) Tablet Oral SEE NOTE, Taken starting 07/24/2015) Active. (Take at 2pm, 3pm, and 10pm the day prior to your colon operation)  Social History Leighton Ruff, MD; 1/88/4166 12:49 PM) Caffeine use Carbonated beverages, Coffee, Tea. No drug use Tobacco use Never smoker. Alcohol use Occasional alcohol use.  Family History Leighton Ruff, MD; 0/63/0160 12:49 PM) Ischemic Bowel Disease Brother, Sister. Alcohol Abuse Mother. Arthritis Mother. Diabetes Mellitus Sister. Heart Disease Father. Hypertension Brother, Father, Mother, Sister. Breast Cancer Mother, Sister. Colon Polyps Brother. Depression Mother, Sister.  Pregnancy / Birth History Leighton Ruff, MD; 11/17/3233 12:49 PM) Maternal age 3-20 Para 2 Irregular periods Age at menarche 45 years. Age of menopause 69-60 Gravida 6   BP 123/82 mmHg  Pulse 84  Temp(Src) 98.1 F (36.7 C) (Oral)  Resp 18  Ht 5' 3.5" (1.613 m)  Wt 89.018 kg (196 lb 4 oz)  BMI 34.21 kg/m2  SpO2 100%   Physical Exam  General Mental Status-Alert. General Appearance-Consistent with stated age. Hydration-Well hydrated. Voice-Normal.  Head and Neck Head-normocephalic, atraumatic with no lesions or palpable masses. Trachea-midline. Thyroid Gland Characteristics - normal size and consistency.  Eye Eyeball - Bilateral-Extraocular movements intact. Sclera/Conjunctiva - Bilateral-No scleral icterus.  Chest and Lung Exam Chest and lung exam reveals -quiet, even and easy respiratory effort with no use of accessory muscles and on auscultation, normal breath sounds, no adventitious sounds and normal vocal  resonance. Inspection Chest Wall - Normal. Back - normal.  Cardiovascular Cardiovascular examination reveals -normal heart  sounds, regular rate and rhythm with no murmurs and normal pedal pulses bilaterally.  Abdomen Inspection Inspection of the abdomen reveals - No Hernias. Skin - Scar - Note: Pfannenstiel scar noted. Palpation/Percussion Palpation and Percussion of the abdomen reveal - Soft, Non Tender, No Rebound tenderness, No Rigidity (guarding) and No hepatosplenomegaly.  Neurologic Neurologic evaluation reveals -alert and oriented x 3 with no impairment of recent or remote memory. Mental Status-Normal.  Musculoskeletal Normal Exam - Left-Upper Extremity Strength Normal and Lower Extremity Strength Normal. Normal Exam - Right-Upper Extremity Strength Normal and Lower Extremity Strength Normal.    Assessment & Plan   CHRONIC ULCERATIVE COLITIS WITHOUT COMPLICATION (H70.26) Impression: 59 year old female with chronic ulcerative colitis. She is currently on 42m of prednisone a day.  She does not want a j pouch.  I have recommended a total proctocolectomy performed in a minimally invasive fashion with an ileostomy.  We have discussed this and all her options in detail. All questions were answered. The surgery and anatomy were described to the patient as well as the risks of surgery and the possible complications. These include: Bleeding, deep abdominal infections and possible wound complications such as hernia and infection, damage to adjacent structures, leak of surgical connections, which can lead to other surgeries and possibly an ostomy, possible need for other procedures, such as abscess drains in radiology, possible prolonged hospital stay, possible diarrhea from removal of part of the colon, possible constipation from narcotics, possible bowel, bladder or sexual dysfunction if having rectal surgery, prolonged fatigue/weakness or appetite loss, possible early recurrence  of of disease, possible complications of their medical problems such as heart disease or arrhythmias or lung problems, death (less than 1%). I believe the patient understands and wishes to proceed with the surgery.

## 2015-09-25 NOTE — Transfer of Care (Signed)
Immediate Anesthesia Transfer of Care Note  Patient: Latasha Wood  Procedure(s) Performed: Procedure(s) with comments: XI ROBOT PROCTOCOLECTOMY/TOTAL ABDOMINAL COLECTOMY (N/A) XI ROBOTIC ASSISTED LAPAROSCOPIC LYSIS OF ADHESION (N/A) - 90 minutes LAPAROSCOPIC BOWEL RESECTION TIMES TWO (N/A) ILEO LOOP COLOSTOMY (N/A)  Patient Location: PACU  Anesthesia Type:General  Level of Consciousness:  sedated, patient cooperative and responds to stimulation  Airway & Oxygen Therapy:Patient Spontanous Breathing and Patient connected to face mask oxgen  Post-op Assessment:  Report given to PACU RN and Post -op Vital signs reviewed and stable  Post vital signs:  Reviewed and stable  Last Vitals:  Filed Vitals:   09/25/15 0939  BP: 123/82  Pulse: 84  Temp: 36.7 C  Resp: 18    Complications: No apparent anesthesia complications

## 2015-09-25 NOTE — Anesthesia Procedure Notes (Signed)
Procedure Name: Intubation Date/Time: 09/25/2015 12:57 PM Performed by: Anne Fu Pre-anesthesia Checklist: Patient identified, Emergency Drugs available, Suction available, Patient being monitored and Timeout performed Patient Re-evaluated:Patient Re-evaluated prior to inductionOxygen Delivery Method: Circle system utilized Preoxygenation: Pre-oxygenation with 100% oxygen Intubation Type: IV induction Ventilation: Mask ventilation without difficulty Laryngoscope Size: Mac and 4 Grade View: Grade II Tube type: Oral Tube size: 7.5 mm Number of attempts: 2 Airway Equipment and Method: Stylet Placement Confirmation: ETT inserted through vocal cords under direct vision,  positive ETCO2,  CO2 detector and breath sounds checked- equal and bilateral Secured at: 22 cm Tube secured with: Tape Dental Injury: Teeth and Oropharynx as per pre-operative assessment  Comments: First attempt MAC 3 with Grade III view converted to MAC 4 noted Grade II view passed tube with ease. Noted bilat lung sounds post intubation and positive ETCO2 noted post intubation.

## 2015-09-26 ENCOUNTER — Encounter (HOSPITAL_COMMUNITY): Payer: Self-pay | Admitting: General Surgery

## 2015-09-26 LAB — BASIC METABOLIC PANEL
Anion gap: 7 (ref 5–15)
BUN: 13 mg/dL (ref 6–20)
CHLORIDE: 104 mmol/L (ref 101–111)
CO2: 25 mmol/L (ref 22–32)
CREATININE: 1.26 mg/dL — AB (ref 0.44–1.00)
Calcium: 8.1 mg/dL — ABNORMAL LOW (ref 8.9–10.3)
GFR calc non Af Amer: 45 mL/min — ABNORMAL LOW (ref >60)
GFR, EST AFRICAN AMERICAN: 53 mL/min — AB (ref >60)
Glucose, Bld: 245 mg/dL — ABNORMAL HIGH (ref 65–99)
Potassium: 3.9 mmol/L (ref 3.5–5.1)
Sodium: 136 mmol/L (ref 135–145)

## 2015-09-26 LAB — CBC
HCT: 29.5 % — ABNORMAL LOW (ref 36.0–46.0)
Hemoglobin: 9.9 g/dL — ABNORMAL LOW (ref 12.0–15.0)
MCH: 30 pg (ref 26.0–34.0)
MCHC: 33.6 g/dL (ref 30.0–36.0)
MCV: 89.4 fL (ref 78.0–100.0)
PLATELETS: 368 10*3/uL (ref 150–400)
RBC: 3.3 MIL/uL — AB (ref 3.87–5.11)
RDW: 13.8 % (ref 11.5–15.5)
WBC: 16.6 10*3/uL — ABNORMAL HIGH (ref 4.0–10.5)

## 2015-09-26 LAB — GLUCOSE, CAPILLARY
GLUCOSE-CAPILLARY: 139 mg/dL — AB (ref 65–99)
GLUCOSE-CAPILLARY: 144 mg/dL — AB (ref 65–99)
GLUCOSE-CAPILLARY: 188 mg/dL — AB (ref 65–99)
GLUCOSE-CAPILLARY: 243 mg/dL — AB (ref 65–99)
Glucose-Capillary: 139 mg/dL — ABNORMAL HIGH (ref 65–99)

## 2015-09-26 MED ORDER — LIP MEDEX EX OINT
TOPICAL_OINTMENT | CUTANEOUS | Status: DC | PRN
  Filled 2015-09-26: qty 7

## 2015-09-26 MED ORDER — INSULIN ASPART 100 UNIT/ML ~~LOC~~ SOLN
0.0000 [IU] | Freq: Four times a day (QID) | SUBCUTANEOUS | Status: DC
  Administered 2015-09-26 (×2): 3 [IU] via SUBCUTANEOUS
  Administered 2015-09-26: 4 [IU] via SUBCUTANEOUS
  Administered 2015-09-27: 3 [IU] via SUBCUTANEOUS

## 2015-09-26 NOTE — Care Management Note (Signed)
Case Management Note  Patient Details  Name: Latasha Wood MRN: [id number] Date of Birth: 16-Mar-1956  Subjective/Objective:         S/p proctoscopy colectomy with end ileostomy           Action/Plan: Discharge planning, spoke with patient at bedside. Discussed need for [facility] RN for ostomy care, patient does not have a preference of [place] agency. Contacted [facility] for referral, they will follow for d/c needs. Will continue to follow and update as needed.  Expected Discharge Date:                  Expected Discharge Plan:  [place]  In-House Referral:  NA  Discharge planning Services  NA  Post Acute Care Choice:  Home Health Choice offered to:  Patient  DME Arranged:  N/A DME Agency:  NA  HH Arranged:  RN, Disease Management [place] Agency:  [place]  Status of Service:  Completed, signed off  Medicare Important Message Given:    Date Medicare IM Given:    Medicare IM give by:    Date Additional Medicare IM Given:    Additional Medicare Important Message give by:     If discussed at [place] of Stay Meetings, dates discussed:    Additional Comments:  Guadalupe Maple, RN 09/26/2015, 1:24 PM

## 2015-09-26 NOTE — Op Note (Signed)
09/25/2015   PATIENT:  Latasha Wood  59 y.o. female  Patient Care Team: Janith Lima, MD as PCP - General (Internal Medicine) [place] Dragon, MD (Gastroenterology) Brien Few, MD (Obstetrics and Gynecology) Kem Parkinson, MD (Ophthalmology)  PRE-OPERATIVE DIAGNOSIS:  chronic ulcerative colitis     POST-OPERATIVE DIAGNOSIS:  chronic ulcerative colitis  PROCEDURE:  XI ROBOT ASSISTED PROCTOCOLECTOMY WITH ILEOSTOMY XI ROBOTIC ASSISTED LAPAROSCOPIC LYSIS OF ADHESION (for 1.5 hrs) SMALL BOWEL RESECTION     Surgeon(s): Leighton Ruff, MD Michael Boston, MD  ASSISTANT: Johney Maine   ANESTHESIA:   general  EBL:     DRAINS: (53F) Jackson-Pratt drain(s) with closed bulb suction in the pelvis   SPECIMEN:  Source of Specimen:  entire colon and rectum, small bowel  DISPOSITION OF SPECIMEN:  PATHOLOGY  COUNTS:  YES  PLAN OF CARE: Admit to inpatient   PATIENT DISPOSITION:  PACU - hemodynamically stable.  INDICATION: Patient with chronic ulcerative colitis. She is steroid dependent. She has tried biologic therapy with no improvement in her symptoms. She has elected to proceed with proctoscopy colectomy with end ileostomy. All options were discussed with patient including J-pouch. All questions were answered.   OR FINDINGS: Significant small bowel adhesions.  DESCRIPTION: the patient was identified in the preoperative holding area and taken to the OR where they were laid supine on the operating room table.  General anesthesia was induced without difficulty.  The patient was placed in lithotomy position.  SCDs were also noted to be in place prior to the initiation of anesthesia.  The patient was then prepped and draped in the usual sterile fashion.   A surgical timeout was performed indicating the correct patient, procedure, positioning and need for preoperative antibiotics.  I began by placing the patient in reverse Trendelenburg and right side down.  I gained entry using a  Varies needle.  I placed an 75m port.  Camera inspection revealed no injury at either site.  I placed additional ports in the abdomen horizontally at midline.  I placed a 5 mm port in the suprapubic region and a 5 mm port in the left upper quadrant for assistance. The robot was then brought in and docked to the patient's right side. Instrument is were placed without difficulty under direct visualization.   There were significant adhesions to the abdominal wall due to her previous surgery. These were taken down with scissors robotically.  After all the adhesions were taken down from her abdominal wall I continued to lyse adhesions into her pelvis off of both side walls and down into her pelvis. The small bowel was slowly moved out of the way. This took approximately 90 minutes. After completing this, I ran the small bowel from the terminal ileum upwards. I encountered a piece of small bowel that was chronically inflamed and appeared to be the source of her previous small bowel obstructions. I was concerned that there was a stricture in this small bowel even after I removed it from its adhesions. I decided to perform a small bowel resection. I could not do this robotically due to the positioning of instruments so I opened her Pfannenstiel incision.  Once this was opened I placed an AIT sales professional I brought out the 2 ends of the small bowel and resected the portion of concern.  I was unable to get them to mobilize all the way to the Pfannenstiel incision due to significant adhesions in the mid abdomen.  I decided to T off my Pfannenstiel incision  up approximately 3-4 inches. This allowed me exposure to mobilize the portion of small bowel. I resected this as well as a portion of devitalized bowel proximal to this due to some mesenteric tearing. After this was completed, the 2 ends were joined together in a side-to-side, functional end-to-end in anastomosis with a blue load 75 mm GIA stapler. A TA stapler was  used to close the common enterotomy channel.  I also identified the terminal ileum and divided this with a GIA stapler aproximally 2 cm proximal to the ileocecal valve. The small bowel was then placed back into the abdomen and the cap was placed onto the [place].  Insufflation was reintroduced and the patient was placed in Trendelenburg position. We began to mobilize the right colon off of the lateral sidewall using the robotic vessel sealer. I continued to mobilize the omentum off of the transverse colon to the level of the splenic flexure. I then divided the mesentery of the colon starting with the right side and continuing to the splenic flexure. At this point the robot was undocked and the patient was placed in right side down position; still in Trendelenburg. I continued to divide the splenic flexure using the robotic vessel sealer. I mobilized the left colon off of the white line of [place] on the lateral sidewall. I resected the remaining mesentery also using the vessel sealer staying close to the colon the entire time. We then undocked the robot once again and placed the patient in steep Trendelenburg with no tilt. I mobilized the rectum off of its mesentery using the vessel sealer. This was mobilize down to the anal rectal junction. After this was complete, I divided the anal rectal junction at the dentate line using Bovie electrocautery. I incised the remaining attachments and removed the colon in its entirety from the anal canal. I then used 2-0 Vicryl sutures in interrupted fashion to close the anal canal.  I placed a 67 [place] Blake drain in the pelvis and irrigated with 2 L of warm normal saline. Hemostasis appeared good. I mobilized the omentum back down over the small bowel. I resected a portion of omentum that appeared devitalized. I brought out the terminal ileum through the 12 mm port site in the previously marked ileostomy site. All ports and the Alexis wound protector were removed from the  abdomen. The drain was sutured in place with a 2-0 nylon suture. We then switched to clean instruments, gowns, gloves and drapes.  The Pfannenstiel incision was closed using interrupted #0 Novafil sutures.  The subcutaneous tissue was reapproximated using interrupted 2-0 Vicryl sutures. The skin was closed with a running 4-0 Vicryl suture. The remaining port site skin was closed also using a running 4-0 Vicryl suture. Sterile dressings were applied. The ileostomy was then matured in standard [place] fashion. This was completed using 2-0 Vicryl sutures. An ostomy appliance was placed. All counts were correct per operating room staff. The patient was awakened and sent to the postanesthesia care unit in stable condition.

## 2015-09-26 NOTE — Evaluation (Signed)
Physical Therapy Evaluation Patient Details Name: Latasha Wood MRN: [id number] DOB: May 23, 1956 Today's Date: 09/26/2015   History of Present Illness  59 yo female s/p PROCTOCOLECTOMY WITH ILEOSTOMY and small bowel resection.   PMH of ulcerative Colitis, hypertension, hyperlipidemia, small bowel obstruction, anxiety, hyperlipidemia  Clinical Impression  Pt admitted with above diagnosis. Pt currently with functional limitations due to the deficits listed below (see PT Problem List).  Pt will benefit from skilled PT to increase their independence and safety with mobility to allow discharge to the venue listed below.  Pt tolerated ambulation well POD #1 and has very supportive family.  Pt encouraged to mobilize initially with staff due to multiple lines and dizziness with ambulation during session.  No f/u PT anticipated.     Follow Up Recommendations No PT follow up    Equipment Recommendations  Rolling walker with 5" wheels (pending progress)    Recommendations for Other Services       Precautions / Restrictions Precautions Precaution Comments: ileostomy, L drain Restrictions Weight Bearing Restrictions: No      Mobility  Bed Mobility Overal bed mobility: Needs Assistance Bed Mobility: Supine to Sit     Supine to sit: Supervision     General bed mobility comments: increased time due to abdominal pain, assist for lines  Transfers Overall transfer level: Needs assistance Equipment used: Rolling walker (2 wheeled) Transfers: Sit to/from Stand Sit to Stand: Min assist         General transfer comment: assist to steady upon rise, verbal cues for hand placement, slow transfers due to abdominal pain  Ambulation/Gait Ambulation/Gait assistance: Min guard Ambulation Distance (Feet): 280 Feet Assistive device: Rolling walker (2 wheeled) Gait Pattern/deviations: Step-through pattern     General Gait Details: verbal cues for use of RW, requiring a little UE support  today and pt also reports slight dizziness which did not become worse, occasional standing "stretching" rest breaks, verbal cues for upright posture, SpO2 97% RA and HR 130 bpm  Stairs            Wheelchair Mobility    Modified Rankin (Stroke Patients Only)       Balance                                             Pertinent Vitals/Pain Pain Assessment: 0-10 Pain Score: 5  Pain Location: abdomen Pain Descriptors / Indicators: Sore Pain Intervention(s): Limited activity within patient's tolerance;Monitored during session;Repositioned;PCA encouraged    Home Living Family/patient expects to be discharged to:: Private residence Living Arrangements: Spouse/significant other Available Help at Discharge: Family Type of Home: House Home Access: Stairs to enter   Technical brewer of Steps: 1 and 3 Home Layout: Able to live on main level with bedroom/bathroom;Two level Home Equipment: None      Prior Function Level of Independence: Independent               Hand Dominance        Extremity/Trunk Assessment               Lower Extremity Assessment: Overall WFL for tasks assessed      Cervical / Trunk Assessment: Normal  Communication   Communication: No difficulties  Cognition Arousal/Alertness: Awake/alert Behavior During Therapy: WFL for tasks assessed/performed Overall Cognitive Status: Within Functional Limits for tasks assessed  General Comments      Exercises        Assessment/Plan    PT Assessment Patient needs continued PT services  PT Diagnosis Difficulty walking   PT Problem List Decreased strength;Decreased activity tolerance;Decreased mobility;Decreased knowledge of use of DME  PT Treatment Interventions DME instruction;Gait training;Functional mobility training;Patient/family education;Therapeutic activities;Therapeutic exercise   PT Goals (Current goals can be found in the  Care Plan section) Acute Rehab PT Goals PT Goal Formulation: With patient Time For Goal Achievement: 10/10/15 Potential to Achieve Goals: Good    Frequency Min 3X/week   Barriers to discharge        Co-evaluation               End of Session   Activity Tolerance: Patient tolerated treatment well Patient left: in chair;with call bell/phone within reach;with family/visitor present           Time: 0912-0941 PT Time Calculation (min) (ACUTE ONLY): 29 min   Charges:   PT Evaluation $Initial PT Evaluation Tier I: 1 Procedure PT Treatments $Gait Training: 8-22 mins   PT G Codes:        Clarissa Laird,KATHrine E 09/26/2015, 12:13 PM Carmelia Bake, PT, DPT 09/26/2015 Pager: [phone]

## 2015-09-26 NOTE — Progress Notes (Signed)
1 Day Post-Op Robotic assisted total proctocolectomy, SBR Subjective: Pain controlled, no nausea, sitting up in a chair, ambulated in the hall  Objective: Vital signs in last 24 hours: Temp:  [98.2 F (36.8 C)-98.9 F (37.2 C)] 98.6 F (37 C) (11/17 1000) Pulse Rate:  [78-109] 106 (11/17 1000) Resp:  [9-18] 13 (11/17 1000) BP: (111-146)/(67-93) 111/84 mmHg (11/17 1000) SpO2:  [95 %-100 %] 99 % (11/17 1000)   Intake/Output from previous day: 11/16 0701 - 11/17 0700 In: 4450 [I.V.:4150; IV Piggyback:250] Out: 1215 [Urine:825; Drains:90; Blood:300] Intake/Output this shift: Total I/O In: 250 [I.V.:250] Out: 50 [Drains:50]   General appearance: alert and cooperative GI: normal findings: soft, non-tender ostomy: edematous JP: SS fluid Incision: no significant drainage  Lab Results:   Recent Labs  09/26/15 0455  WBC 16.6*  HGB 9.9*  HCT 29.5*  PLT 368   BMET  Recent Labs  09/26/15 0455  NA 136  K 3.9  CL 104  CO2 25  GLUCOSE 245*  BUN 13  CREATININE 1.26*  CALCIUM 8.1*   PT/INR No results for input(s): LABPROT, INR in the last 72 hours. ABG No results for input(s): PHART, HCO3 in the last 72 hours.  Invalid input(s): PCO2, PO2  MEDS, Scheduled . acetaminophen  1,000 mg Oral 4 times per day  . alvimopan  12 mg Oral BID  . enoxaparin (LOVENOX) injection  40 mg Subcutaneous Q24H  . hydrochlorothiazide  12.5 mg Oral Daily  . insulin aspart  0-20 Units Subcutaneous 4 times per day  . irbesartan  300 mg Oral Daily  . morphine   Intravenous 6 times per day  . predniSONE  10 mg Oral Q breakfast    Studies/Results: No results found.  Assessment: s/p Procedure(s): XI ROBOT PROCTOCOLECTOMY/TOTAL ABDOMINAL COLECTOMY XI ROBOTIC ASSISTED LAPAROSCOPIC LYSIS OF ADHESION LAPAROSCOPIC BOWEL RESECTION TIMES TWO ILEO LOOP COLOSTOMY Patient Active Problem List   Diagnosis Date Noted  . Ulcerative colitis ([place]) 09/25/2015  . Mastoid pain 09/05/2015  . Type 2  diabetes mellitus with complication, without long-term current use of insulin ([place]) 09/05/2015  . HLD (hyperlipidemia) 09/01/2015  . Anxiety 09/01/2015  . Pernicious anemia 05/28/2015  . Routine general medical examination at a health care facility 05/28/2015  . Hypokalemia 05/28/2015  . SBO (small bowel obstruction) ([place]) 05/17/2015  . Ulcerative colitis with intestinal obstruction ([place]) 05/17/2015  . Leukocytosis 05/17/2015  . Essential hypertension 12/30/2007    Expected post op course  Plan: increase MIV as UOP has dropped off a bit and pt's HR up some  Incentive spirometry Ambulate Cont NPO until bowel function   LOS: 1 day     .Rosario Adie, [facility], [place]   09/26/2015 10:41 AM

## 2015-09-27 ENCOUNTER — Encounter (HOSPITAL_COMMUNITY): Payer: 59

## 2015-09-27 LAB — BASIC METABOLIC PANEL
ANION GAP: 4 — AB (ref 5–15)
BUN: 9 mg/dL (ref 6–20)
CHLORIDE: 105 mmol/L (ref 101–111)
CO2: 27 mmol/L (ref 22–32)
Calcium: 8.2 mg/dL — ABNORMAL LOW (ref 8.9–10.3)
Creatinine, Ser: 0.83 mg/dL (ref 0.44–1.00)
GFR calc Af Amer: >60 mL/min (ref >60)
GLUCOSE: 147 mg/dL — AB (ref 65–99)
POTASSIUM: 4 mmol/L (ref 3.5–5.1)
Sodium: 136 mmol/L (ref 135–145)

## 2015-09-27 LAB — CBC
HEMATOCRIT: 24.1 % — AB (ref 36.0–46.0)
HEMOGLOBIN: 8 g/dL — AB (ref 12.0–15.0)
MCH: 29.1 pg (ref 26.0–34.0)
MCHC: 33.2 g/dL (ref 30.0–36.0)
MCV: 87.6 fL (ref 78.0–100.0)
Platelets: 271 10*3/uL (ref 150–400)
RBC: 2.75 MIL/uL — ABNORMAL LOW (ref 3.87–5.11)
RDW: 13.8 % (ref 11.5–15.5)
WBC: 16.8 10*3/uL — ABNORMAL HIGH (ref 4.0–10.5)

## 2015-09-27 LAB — GLUCOSE, CAPILLARY
GLUCOSE-CAPILLARY: 124 mg/dL — AB (ref 65–99)
Glucose-Capillary: 108 mg/dL — ABNORMAL HIGH (ref 65–99)
Glucose-Capillary: 114 mg/dL — ABNORMAL HIGH (ref 65–99)

## 2015-09-27 MED ORDER — ACETAMINOPHEN 325 MG PO TABS
650.0000 mg | ORAL_TABLET | Freq: Four times a day (QID) | ORAL | Status: DC | PRN
  Administered 2015-09-27: 650 mg via ORAL
  Filled 2015-09-27: qty 2

## 2015-09-27 MED ORDER — VITAMIN A 50000 UNIT/ML IM SOLN
50000.0000 [IU] | Freq: Every day | INTRAMUSCULAR | Status: AC
  Administered 2015-09-28 – 2015-09-30 (×3): 50000 [IU] via INTRAMUSCULAR
  Filled 2015-09-27 (×5): qty 2

## 2015-09-27 MED ORDER — VITAMIN A 50000 UNIT/ML IM SOLN
50000.0000 [IU] | Freq: Every day | INTRAMUSCULAR | Status: DC
  Filled 2015-09-27: qty 2

## 2015-09-27 MED ORDER — INSULIN ASPART 100 UNIT/ML ~~LOC~~ SOLN
0.0000 [IU] | Freq: Three times a day (TID) | SUBCUTANEOUS | Status: DC
  Administered 2015-09-27 – 2015-09-28 (×2): 3 [IU] via SUBCUTANEOUS
  Administered 2015-09-28: 4 [IU] via SUBCUTANEOUS
  Administered 2015-09-29 (×2): 3 [IU] via SUBCUTANEOUS

## 2015-09-27 NOTE — Progress Notes (Signed)
Called by RN b/c pt had a fever despite using her IS adequately.  I examined the pt.  She is tender to palpation but non-distended.  She is tolerating clear liquids.  Her JP is draining SS fluid.  She is urinating well and not tachycardic.  I have recommended clears when she feels up to it and tylenol for the fever.  We will recheck her labs in the am and cont to monitor closely.

## 2015-09-27 NOTE — Consult Note (Signed)
WOC ostomy consult note Stoma type/location: RUQ ileostomy Stomal assessment/size: 1 and 1/4 inches round, dark red, moist, os at center. Peristomal assessment: intact, clear Treatment options for stomal/peristomal skin: skin barrier ring Output: green/gbrown mucous  Ostomy pouching: 2pc. With skin barrier ring, 2 and 1/4 inch Education provided: PAtient, husband, daughter present for session.  A&P, stoma characteristics, pouch characteristics, emptying, Lock and Roll closure, change frequency and stool consistency taught today.  Patient is able to perform Lock and Roll closure. Educational booklet provided.  Supply obtaining discussed. Enrolled patient in [place] program: Yes [place] nursing team  will remain available to this patient, the nursing and medical teams.  No visit this weekend, will see on Monday.  Please order HHRN if you agree that continued support and teaching will be helpful. Thanks, Maudie Flakes, MSN, RN, [place], Arther Abbott  Pager# [phone]

## 2015-09-27 NOTE — Progress Notes (Signed)
Physical Therapy Treatment Patient Details Name: Latasha Wood MRN: [id number] DOB: Dec 19, 1955 Today's Date: 09/27/2015    History of Present Illness 59 yo female s/p PROCTOCOLECTOMY WITH ILEOSTOMY and small bowel resection.   PMH of ulcerative Colitis, hypertension, hyperlipidemia, small bowel obstruction, anxiety, hyperlipidemia    PT Comments    Progressing well, will see at least one more session for further gait training, hopefully progressing away from [place] next session  Follow Up Recommendations  No PT follow up     Equipment Recommendations  Rolling walker with 5" wheels;Other (comment) (vs no DME--TBA next visit)    Recommendations for Other Services       Precautions / Restrictions Restrictions Weight Bearing Restrictions: No    Mobility  Bed Mobility Overal bed mobility: Needs Assistance Bed Mobility: Supine to Sit     Supine to sit: Supervision     General bed mobility comments: increased time due to abdominal pain, assist for lines, partial roll to help with pain control and incr I  Transfers Overall transfer level: Needs assistance Equipment used: Rolling walker (2 wheeled) Transfers: Sit to/from Stand Sit to Stand: Min guard         General transfer comment: verbal cues for hand placement and use of LEs t o power up/down, pillow to abd for descent to chair d/t pain  Ambulation/Gait Ambulation/Gait assistance: Min guard;Supervision Ambulation Distance (Feet): 320 Feet Assistive device: Rolling walker (2 wheeled) Gait Pattern/deviations: Step-through pattern     General Gait Details: verbal cues for RW safety  esp with turns, multi-modal cues for posture and to decr shoulder elevation and to decr reliance on UEs   Stairs            Wheelchair Mobility    Modified Rankin (Stroke Patients Only)       Balance                                    Cognition Arousal/Alertness: Awake/alert Behavior During Therapy: WFL  for tasks assessed/performed Overall Cognitive Status: Within Functional Limits for tasks assessed                      Exercises      General Comments        Pertinent Vitals/Pain Pain Assessment: 0-10 Pain Score: 3  Pain Location: abdomen Pain Descriptors / Indicators: Discomfort Pain Intervention(s): Limited activity within patient's tolerance;Monitored during session;Repositioned;PCA encouraged    Home Living                      Prior Function            PT Goals (current goals can now be found in the care plan section) Acute Rehab PT Goals Patient Stated Goal: walk, get better PT Goal Formulation: With patient Time For Goal Achievement: 10/10/15 Potential to Achieve Goals: Good Progress towards PT goals: Progressing toward goals    Frequency  Min 3X/week    PT Plan Current plan remains appropriate    Co-evaluation             End of Session   Activity Tolerance: Patient tolerated treatment well Patient left: in chair;with call bell/phone within reach;with family/visitor present     Time: 3875-6433 PT Time Calculation (min) (ACUTE ONLY): 27 min  Charges:  $Gait Training: 23-37 mins  G CodesKenyon Ana 09/27/2015, 11:34 AM

## 2015-09-27 NOTE — Progress Notes (Signed)
2 Days Post-Op Robotic assisted total proctocolectomy, SBR Subjective: Pain controlled, no nausea, sitting up in a chair, ambulated in the hall  Objective: Vital signs in last 24 hours: Temp:  [98.1 F (36.7 C)-98.6 F (37 C)] 98.4 F (36.9 C) (11/18 0602) Pulse Rate:  [78-106] 90 (11/18 0602) Resp:  [11-16] 16 (11/18 0833) BP: (99-111)/(48-84) 105/48 mmHg (11/18 0602) SpO2:  [99 %-100 %] 100 % (11/18 0833) FiO2 (%):  [36 %-37 %] 36 % (11/18 0420)   Intake/Output from previous day: 11/17 0701 - 11/18 0700 In: 2619.6 [I.V.:2619.6] Out: 1925 [Urine:1775; Drains:150] Intake/Output this shift:   General appearance: alert and cooperative GI: normal findings: soft, appropriately tender ostomy: edematous, beefy red JP: SS fluid Incision: min bloody drainage  Lab Results:   Recent Labs  09/26/15 0455 09/27/15 0430  WBC 16.6* 16.8*  HGB 9.9* 8.0*  HCT 29.5* 24.1*  PLT 368 271   BMET  Recent Labs  09/26/15 0455 09/27/15 0430  NA 136 136  K 3.9 4.0  CL 104 105  CO2 25 27  GLUCOSE 245* 147*  BUN 13 9  CREATININE 1.26* 0.83  CALCIUM 8.1* 8.2*   PT/INR No results for input(s): LABPROT, INR in the last 72 hours. ABG No results for input(s): PHART, HCO3 in the last 72 hours.  Invalid input(s): PCO2, PO2  MEDS, Scheduled . alvimopan  12 mg Oral BID  . enoxaparin (LOVENOX) injection  40 mg Subcutaneous Q24H  . hydrochlorothiazide  12.5 mg Oral Daily  . insulin aspart  0-20 Units Subcutaneous 4 times per day  . irbesartan  300 mg Oral Daily  . morphine   Intravenous 6 times per day  . predniSONE  10 mg Oral Q breakfast  . vitamin A  50,000 Units Intramuscular Daily    Studies/Results: No results found.  Assessment: s/p Procedure(s): XI ROBOT PROCTOCOLECTOMY/TOTAL ABDOMINAL COLECTOMY XI ROBOTIC ASSISTED LAPAROSCOPIC LYSIS OF ADHESION LAPAROSCOPIC BOWEL RESECTION TIMES TWO ILEO LOOP COLOSTOMY Patient Active Problem List   Diagnosis Date Noted  .  Ulcerative colitis ([place]) 09/25/2015  . Mastoid pain 09/05/2015  . Type 2 diabetes mellitus with complication, without long-term current use of insulin ([place]) 09/05/2015  . HLD (hyperlipidemia) 09/01/2015  . Anxiety 09/01/2015  . Pernicious anemia 05/28/2015  . Routine general medical examination at a health care facility 05/28/2015  . Hypokalemia 05/28/2015  . SBO (small bowel obstruction) ([place]) 05/17/2015  . Ulcerative colitis with intestinal obstruction ([place]) 05/17/2015  . Leukocytosis 05/17/2015  . Essential hypertension 12/30/2007    Expected post op course  Plan: Cont MIV, will titrate to UOP   Incentive spirometry Ambulate Start clears, monitor ostomy output Vit A for wound healing in the face of chronic steroid use Cont prednisone at 14m per day for 2 weeks Ostomy teaching today   LOS: 2 days     .ARosario Adie MD [facility], [place]  09/27/2015 8:42 AM

## 2015-09-28 LAB — CBC
HEMATOCRIT: 22.6 % — AB (ref 36.0–46.0)
Hemoglobin: 7.6 g/dL — ABNORMAL LOW (ref 12.0–15.0)
MCH: 29.9 pg (ref 26.0–34.0)
MCHC: 33.6 g/dL (ref 30.0–36.0)
MCV: 89 fL (ref 78.0–100.0)
Platelets: 279 10*3/uL (ref 150–400)
RBC: 2.54 MIL/uL — ABNORMAL LOW (ref 3.87–5.11)
RDW: 13.9 % (ref 11.5–15.5)
WBC: 16.1 10*3/uL — ABNORMAL HIGH (ref 4.0–10.5)

## 2015-09-28 LAB — GLUCOSE, CAPILLARY
GLUCOSE-CAPILLARY: 95 mg/dL (ref 65–99)
Glucose-Capillary: 84 mg/dL (ref 65–99)
Glucose-Capillary: 84 mg/dL (ref 65–99)
Glucose-Capillary: 150 mg/dL — ABNORMAL HIGH (ref 65–99)
Glucose-Capillary: 172 mg/dL — ABNORMAL HIGH (ref 65–99)

## 2015-09-28 LAB — BASIC METABOLIC PANEL
Anion gap: 5 (ref 5–15)
BUN: 7 mg/dL (ref 6–20)
CALCIUM: 8.4 mg/dL — AB (ref 8.9–10.3)
CO2: 28 mmol/L (ref 22–32)
CREATININE: 0.89 mg/dL (ref 0.44–1.00)
Chloride: 105 mmol/L (ref 101–111)
GFR calc Af Amer: >60 mL/min (ref >60)
GLUCOSE: 110 mg/dL — AB (ref 65–99)
Potassium: 3.7 mmol/L (ref 3.5–5.1)
Sodium: 138 mmol/L (ref 135–145)

## 2015-09-28 LAB — URINALYSIS, ROUTINE W REFLEX MICROSCOPIC
Bilirubin Urine: NEGATIVE
GLUCOSE, UA: NEGATIVE mg/dL
Ketones, ur: NEGATIVE mg/dL
Leukocytes, UA: NEGATIVE
Nitrite: NEGATIVE
PH: 7 (ref 5.0–8.0)
Protein, ur: NEGATIVE mg/dL
SPECIFIC GRAVITY, URINE: 1.008 (ref 1.005–1.030)

## 2015-09-28 LAB — URINE MICROSCOPIC-ADD ON

## 2015-09-28 NOTE — Progress Notes (Signed)
Physical Therapy Treatment Patient Details Name: MAKENZY KRIST MRN: [id number] DOB: 05-Apr-1956 Today's Date: 2015/10/27    History of Present Illness 59 yo female s/p PROCTOCOLECTOMY WITH ILEOSTOMY and small bowel resection.   PMH of ulcerative Colitis, hypertension, hyperlipidemia, small bowel obstruction, anxiety, hyperlipidemia    PT Comments    Pt making excellent progress with mobility, may benefit from abdominal binder for added pain control with transitions and amb; RN made aware;  Follow Up Recommendations  No PT follow up     Equipment Recommendations  None recommended by PT    Recommendations for Other Services       Precautions / Restrictions Precautions Precaution Comments: ileostomy, L JP drain Restrictions Weight Bearing Restrictions: No    Mobility  Bed Mobility               General bed mobility comments: pt in chair  Transfers Overall transfer level: Needs assistance Equipment used: None Transfers: Sit to/from Stand Sit to Stand: Supervision         General transfer comment: verbal cues for hand placement and use of LEs t o power up/down,  Ambulation/Gait Ambulation/Gait assistance: Supervision;Min guard Ambulation Distance (Feet): 280 Feet (40' more) Assistive device: Rolling walker (2 wheeled) Gait Pattern/deviations: Step-through pattern     General Gait Details: cues for posture, step length   Stairs Stairs: Yes Stairs assistance: Min guard Stair Management: One rail Right;Alternating pattern;Forwards Number of Stairs: 5 General stair comments: cues for safe technique, instructed in HHA if needed  Wheelchair Mobility    Modified Rankin (Stroke Patients Only)       Balance                                    Cognition Arousal/Alertness: Awake/alert Behavior During Therapy: WFL for tasks assessed/performed Overall Cognitive Status: Within Functional Limits for tasks assessed                       Exercises Total Joint Exercises Ankle Circles/Pumps: AROM;Both;10 reps Quad Sets: AROM;Both;10 reps    General Comments        Pertinent Vitals/Pain Pain Assessment: 0-10 Pain Score: 4  Pain Location: abdomen Pain Descriptors / Indicators: Grimacing;Sore Pain Intervention(s): Limited activity within patient's tolerance;Monitored during session;PCA encouraged    Home Living                      Prior Function            PT Goals (current goals can now be found in the care plan section) Acute Rehab PT Goals Patient Stated Goal: walk, get better PT Goal Formulation: With patient Time For Goal Achievement: 10/10/15 Potential to Achieve Goals: Good Progress towards PT goals: Progressing toward goals    Frequency  Min 3X/week    PT Plan Current plan remains appropriate    Co-evaluation             End of Session   Activity Tolerance: Patient tolerated treatment well Patient left: in chair;with call bell/phone within reach     Time: 1016-1041 PT Time Calculation (min) (ACUTE ONLY): 25 min  Charges:  $Gait Training: 23-37 mins                    G Codes:      Donnisha Besecker October 27, 2015, 10:53 AM

## 2015-09-28 NOTE — Progress Notes (Signed)
3 Days Post-Op  Subjective: No n/v. Still very sore. Tolerated liquids. Been doing IS - up to 1400; c/o burning with urination and odor and dark urine Temp 101 yesterday afternoon  Objective: Vital signs in last 24 hours: Temp:  [98.9 F (37.2 C)-101.3 F (38.5 C)] 98.9 F (37.2 C) (11/19 0543) Pulse Rate:  [85-96] 86 (11/19 0543) Resp:  [11-18] 12 (11/19 0839) BP: (98-104)/(41-61) 98/41 mmHg (11/19 0202) SpO2:  [95 %-100 %] 97 % (11/19 0839) Last BM Date: 09/25/15  Intake/Output from previous day: 11/18 0701 - 11/19 0700 In: 2123.3 [P.O.:360; I.V.:1763.3] Out: 3843 [Urine:2975; Drains:68; Stool:800] Intake/Output this shift: Total I/O In: -  Out: 450 [Urine:400; Stool:50]  Alert, nontoxic, looks good cta b/l Reg Soft, fairly NT, nd. +BS. Ostomy viable with liquid stool Drain -serosang +SCDs, no edema  Lab Results:   Recent Labs  09/27/15 0430 09/28/15 0400  WBC 16.8* 16.1*  HGB 8.0* 7.6*  HCT 24.1* 22.6*  PLT 271 279   BMET  Recent Labs  09/27/15 0430 09/28/15 0400  NA 136 138  K 4.0 3.7  CL 105 105  CO2 27 28  GLUCOSE 147* 110*  BUN 9 7  CREATININE 0.83 0.89  CALCIUM 8.2* 8.4*   PT/INR No results for input(s): LABPROT, INR in the last 72 hours. ABG No results for input(s): PHART, HCO3 in the last 72 hours.  Invalid input(s): PCO2, PO2  Studies/Results: No results found.  Anti-infectives: Anti-infectives    Start     Dose/Rate Route Frequency Ordered Stop   09/26/15 0800  cefoTEtan (CEFOTAN) 2 g in dextrose 5 % 50 mL IVPB     2 g 100 mL/hr over 30 Minutes Intravenous Every 12 hours 09/25/15 2152 09/26/15 1015   09/25/15 0943  cefoTEtan in Dextrose 5% (CEFOTAN) IVPB 2 g     2 g Intravenous On call to O.R. 09/25/15 0943 09/25/15 1327      Assessment/Plan: s/p Procedure(s) with comments: XI ROBOT PROCTOCOLECTOMY/TOTAL ABDOMINAL COLECTOMY (N/A) XI ROBOTIC ASSISTED LAPAROSCOPIC LYSIS OF ADHESION (N/A) - 90 minutes LAPAROSCOPIC BOWEL  RESECTION TIMES TWO (N/A) ILEO LOOP COLOSTOMY (N/A)  Had isolated temp yesterday but otherwise looks great. Has persistent leukocytosis, ?steroids No tachycardia, not ill appearing, expected TTP on POD 3.  Will check UA since c/o of symptoms Adv to fulls Ambulate, IS Chemical vte prophylaxis Cont IVF at current rate given ostomy output - will monitor output If does well today, will stop pca on Sunday  Latasha Scally M. Redmond Pulling, MD, FACS General, Bariatric, & Minimally Invasive Surgery [facility], [place]   LOS: 3 days    Latasha Wood 09/28/2015

## 2015-09-29 LAB — BASIC METABOLIC PANEL
Anion gap: 6 (ref 5–15)
BUN: 6 mg/dL (ref 6–20)
CHLORIDE: 104 mmol/L (ref 101–111)
CO2: 26 mmol/L (ref 22–32)
CREATININE: 0.76 mg/dL (ref 0.44–1.00)
Calcium: 8.3 mg/dL — ABNORMAL LOW (ref 8.9–10.3)
GFR calc Af Amer: >60 mL/min (ref >60)
GFR calc non Af Amer: >60 mL/min (ref >60)
Glucose, Bld: 104 mg/dL — ABNORMAL HIGH (ref 65–99)
POTASSIUM: 3.7 mmol/L (ref 3.5–5.1)
SODIUM: 136 mmol/L (ref 135–145)

## 2015-09-29 LAB — GLUCOSE, CAPILLARY
GLUCOSE-CAPILLARY: 133 mg/dL — AB (ref 65–99)
GLUCOSE-CAPILLARY: 136 mg/dL — AB (ref 65–99)
Glucose-Capillary: 85 mg/dL (ref 65–99)
Glucose-Capillary: 124 mg/dL — ABNORMAL HIGH (ref 65–99)

## 2015-09-29 LAB — CBC
HEMATOCRIT: 22.5 % — AB (ref 36.0–46.0)
Hemoglobin: 7.5 g/dL — ABNORMAL LOW (ref 12.0–15.0)
MCH: 29.4 pg (ref 26.0–34.0)
MCHC: 33.3 g/dL (ref 30.0–36.0)
MCV: 88.2 fL (ref 78.0–100.0)
Platelets: 325 10*3/uL (ref 150–400)
RBC: 2.55 MIL/uL — AB (ref 3.87–5.11)
RDW: 13.7 % (ref 11.5–15.5)
WBC: 13.5 10*3/uL — AB (ref 4.0–10.5)

## 2015-09-29 MED ORDER — MORPHINE SULFATE (PF) 2 MG/ML IV SOLN
1.0000 mg | INTRAVENOUS | Status: DC | PRN
  Administered 2015-09-29: 1 mg via INTRAVENOUS
  Administered 2015-09-30: 2 mg via INTRAVENOUS
  Filled 2015-09-29 (×2): qty 1

## 2015-09-29 MED ORDER — OXYCODONE-ACETAMINOPHEN 5-325 MG PO TABS
1.0000 | ORAL_TABLET | ORAL | Status: DC | PRN
  Administered 2015-09-29: 2 via ORAL
  Administered 2015-09-29: 1 via ORAL
  Administered 2015-09-29 – 2015-10-01 (×9): 2 via ORAL
  Filled 2015-09-29 (×4): qty 2
  Filled 2015-09-29: qty 1
  Filled 2015-09-29 (×6): qty 2

## 2015-09-30 LAB — CBC
HEMATOCRIT: 22 % — AB (ref 36.0–46.0)
HEMOGLOBIN: 7.3 g/dL — AB (ref 12.0–15.0)
MCH: 29.1 pg (ref 26.0–34.0)
MCHC: 33.2 g/dL (ref 30.0–36.0)
MCV: 87.6 fL (ref 78.0–100.0)
Platelets: 369 10*3/uL (ref 150–400)
RBC: 2.51 MIL/uL — AB (ref 3.87–5.11)
RDW: 13.5 % (ref 11.5–15.5)
WBC: 13.3 10*3/uL — AB (ref 4.0–10.5)

## 2015-09-30 LAB — GLUCOSE, CAPILLARY
GLUCOSE-CAPILLARY: 88 mg/dL (ref 65–99)
Glucose-Capillary: 136 mg/dL — ABNORMAL HIGH (ref 65–99)
Glucose-Capillary: 98 mg/dL (ref 65–99)
Glucose-Capillary: 107 mg/dL — ABNORMAL HIGH (ref 65–99)

## 2015-09-30 LAB — BASIC METABOLIC PANEL
Anion gap: 8 (ref 5–15)
BUN: 9 mg/dL (ref 6–20)
CHLORIDE: 104 mmol/L (ref 101–111)
CO2: 25 mmol/L (ref 22–32)
Calcium: 8.5 mg/dL — ABNORMAL LOW (ref 8.9–10.3)
Creatinine, Ser: 0.89 mg/dL (ref 0.44–1.00)
GFR calc Af Amer: >60 mL/min (ref >60)
GFR calc non Af Amer: >60 mL/min (ref >60)
Glucose, Bld: 97 mg/dL (ref 65–99)
POTASSIUM: 3.6 mmol/L (ref 3.5–5.1)
SODIUM: 137 mmol/L (ref 135–145)

## 2015-09-30 MED ORDER — SODIUM CHLORIDE 0.9 % IJ SOLN: 3.0000 mL | INTRAMUSCULAR | Status: DC | PRN

## 2015-09-30 MED ORDER — SODIUM CHLORIDE 0.9 % IJ SOLN
3.0000 mL | Freq: Two times a day (BID) | INTRAMUSCULAR | Status: DC
  Administered 2015-09-30 – 2015-10-01 (×3): 3 mL via INTRAVENOUS

## 2015-09-30 NOTE — Progress Notes (Signed)
5 Days Post-Op Robotic assisted total proctocolectomy, SBR Subjective: Pain controlled, no nausea, ambulating well, tolerating soft foods  Objective: Vital signs in last 24 hours: Temp:  [98 F (36.7 C)-98.2 F (36.8 C)] 98.1 F (36.7 C) (11/21 0532) Pulse Rate:  [78-93] 88 (11/21 0532) Resp:  [15-18] 16 (11/21 0532) BP: (121-128)/(50-80) 123/80 mmHg (11/21 0532) SpO2:  [95 %-100 %] 95 % (11/21 0532)   Intake/Output from previous day: 11/20 0701 - 11/21 0700 In: 1364.2 [P.O.:240; I.V.:1124.2] Out: 2635 [Urine:2150; Drains:110; Stool:375] Intake/Output this shift:   General appearance: alert and cooperative GI: normal findings: soft, appropriately tender ostomy: edematous, sloughing some JP: SS fluid Incision: min bloody drainage  Lab Results:   Recent Labs  09/29/15 0600 09/30/15 0446  WBC 13.5* 13.3*  HGB 7.5* 7.3*  HCT 22.5* 22.0*  PLT 325 369   BMET  Recent Labs  09/29/15 0600 09/30/15 0446  NA 136 137  K 3.7 3.6  CL 104 104  CO2 26 25  GLUCOSE 104* 97  BUN 6 9  CREATININE 0.76 0.89  CALCIUM 8.3* 8.5*   PT/INR No results for input(s): LABPROT, INR in the last 72 hours. ABG No results for input(s): PHART, HCO3 in the last 72 hours.  Invalid input(s): PCO2, PO2  MEDS, Scheduled . enoxaparin (LOVENOX) injection  40 mg Subcutaneous Q24H  . hydrochlorothiazide  12.5 mg Oral Daily  . irbesartan  300 mg Oral Daily  . predniSONE  10 mg Oral Q breakfast  . sodium chloride  3 mL Intravenous Q12H  . vitamin A  50,000 Units Intramuscular Daily    Studies/Results: No results found.  Assessment: s/p Procedure(s): XI ROBOT PROCTOCOLECTOMY/TOTAL ABDOMINAL COLECTOMY XI ROBOTIC ASSISTED LAPAROSCOPIC LYSIS OF ADHESION LAPAROSCOPIC BOWEL RESECTION TIMES TWO ILEO LOOP COLOSTOMY Patient Active Problem List   Diagnosis Date Noted  . Ulcerative colitis ([place]) 09/25/2015  . Mastoid pain 09/05/2015  . Type 2 diabetes mellitus with complication, without  long-term current use of insulin ([place]) 09/05/2015  . HLD (hyperlipidemia) 09/01/2015  . Anxiety 09/01/2015  . Pernicious anemia 05/28/2015  . Routine general medical examination at a health care facility 05/28/2015  . Hypokalemia 05/28/2015  . SBO (small bowel obstruction) ([place]) 05/17/2015  . Ulcerative colitis with intestinal obstruction ([place]) 05/17/2015  . Leukocytosis 05/17/2015  . Essential hypertension 12/30/2007    Expected post op course  Plan: Ostomy output adequate, good uop, will d/c iv fluids Incentive spirometry Cont to ambulate Cont soft foods Vit A for wound healing in the face of chronic steroid use Cont prednisone at 22m per day for at least 2 more weeks Ostomy teaching again today, pt has several questions Acute blood loss anemia: Hgb seems to be leveling off, asymptomatic, recheck cbc in AM Hopefully d/c in AM   LOS: 5 days     .ARosario Adie M[facility]bes[facility], [place]  09/30/2015 9:00 AM

## 2015-09-30 NOTE — Progress Notes (Signed)
Sorry....wrong pt

## 2015-09-30 NOTE — Progress Notes (Signed)
DELAYED NOTE ENTRY  I saw and rounded on pt on 11/20 but somehow note got lost.   CC: less pain. Ambulated in hall several times. No vomiting. Tolerated thicker liquids. Concerned about PCA being taken away today. Nurse reports some drainage around JP site  Afebrile, vitals stable. No tachycardia Had about 800cc from ostomy from sat to sun am  Alert, husband and nurse at [place], obese, mild TTP; ostomy viable with liquid stool in bag. JP - serosang.  No edema   POD 4 s/p robotic assisted total proctocolectomy, SB resection Doing well Adv diet to soft  D/c pca, oral pain meds, IV for breakthru Dec IVF Wbc going down, afebrile.  Ostomy teaching Home next 1-2 days  Latasha Wood. Redmond Pulling, MD, FACS General, Bariatric, & Minimally Invasive Surgery [facility], [place]

## 2015-09-30 NOTE — Consult Note (Signed)
WOC ostomy follow up Stoma type/location: RUQ ileostomy Stomal assessment/size: Slightly less than 1 and 1/4 inches round, budded, with sloughing mucosa. Peristomal assessment: intact, clear Treatment options for stomal/peristomal skin: skin barrier ring Output: thickening brown stool Ostomy pouching: 2pc. With skin barrier ring Education provided: Patient had prepared questions for me upon my arrival today; she and husband are beginning to work through logistics of having a stoma. Pouch change today with patient cutting out skin barrier and husband cueing me for steps in process. Dietary management of stool consistency taught, also how to use a toilet paper "wick" in pouch tail closure cleaning after emptying. Patient and spouse have my contact information should questions arise following discharge.  Also, HHRN should be assisting in post acute reinforcement of teaching.  Patient and husband state that these arrangements have not yet been confirmed. Both are looking forward to possible discharge in am. Enrolled patient in [place] Start Discharge program: Yes [place] nursing team will not follow, but will remain available to this patient, the nursing and medical teams.  Please re-consult if needed. Thanks, Maudie Flakes, MSN, RN, [place], Arther Abbott  Pager# [phone]

## 2015-09-30 NOTE — Progress Notes (Signed)
Assumed care of patient at this time. Patient is stable with no complaints at this time. Agree with previously documented assessment. Will continue to monitor patient.   Othella Boyer [facility] 09/30/2015

## 2015-09-30 NOTE — Progress Notes (Signed)
PT Cancellation Note  Patient Details Name: Latasha Wood MRN: [id number] DOB: [birth year]   Cancelled Treatment:     RN reports pt has been amb the hallway Indep with her IV pole all weekend.  Discussed with pt and she agrees, Acute PT is not needed.  Will consult LPT to D/C.     Nathanial Rancher 09/30/2015, 12:06 PM

## 2015-10-01 LAB — CBC
HEMATOCRIT: 22.9 % — AB (ref 36.0–46.0)
Hemoglobin: 7.8 g/dL — ABNORMAL LOW (ref 12.0–15.0)
MCH: 29.8 pg (ref 26.0–34.0)
MCHC: 34.1 g/dL (ref 30.0–36.0)
MCV: 87.4 fL (ref 78.0–100.0)
Platelets: 414 10*3/uL — ABNORMAL HIGH (ref 150–400)
RBC: 2.62 MIL/uL — ABNORMAL LOW (ref 3.87–5.11)
RDW: 13.5 % (ref 11.5–15.5)
WBC: 12.6 10*3/uL — ABNORMAL HIGH (ref 4.0–10.5)

## 2015-10-01 LAB — URINE CULTURE: Culture: 50000

## 2015-10-01 LAB — GLUCOSE, CAPILLARY
GLUCOSE-CAPILLARY: 64 mg/dL — AB (ref 65–99)
GLUCOSE-CAPILLARY: 90 mg/dL (ref 65–99)
Glucose-Capillary: 97 mg/dL (ref 65–99)

## 2015-10-01 MED ORDER — OXYCODONE-ACETAMINOPHEN 5-325 MG PO TABS: 1.0000 | ORAL_TABLET | ORAL | Status: DC | PRN

## 2015-10-01 MED ORDER — PREDNISONE 10 MG PO TABS: 10.0000 mg | ORAL_TABLET | Freq: Every day | ORAL | Status: DC

## 2015-10-01 NOTE — Discharge Instructions (Signed)
ABDOMINAL SURGERY: POST OP INSTRUCTIONS  1. DIET: Follow a light bland diet the first 24 hours after arrival home, such as soup, liquids, crackers, etc.  Be sure to include lots of fluids daily.  Avoid fast food or heavy meals as your are more likely to get nauseated.  Do not eat any uncooked fruits or vegetables for the next 2 weeks as your colon heals. 2. Take your usually prescribed home medications unless otherwise directed. 3. PAIN CONTROL: a. Pain is best controlled by a usual combination of three different methods TOGETHER: i. Ice/Heat ii. Over the counter pain medication iii. Prescription pain medication b. Most patients will experience some swelling and bruising around the incisions.  Ice packs or heating pads (30-60 minutes up to 6 times a day) will help. Use ice for the first few days to help decrease swelling and bruising, then switch to heat to help relax tight/sore spots and speed recovery.  Some people prefer to use ice alone, heat alone, alternating between ice & heat.  Experiment to what works for you.  Swelling and bruising can take several weeks to resolve.   c. It is helpful to take an over-the-counter pain medication regularly for the first few weeks.  Choose one of the following that works best for you: i. Naproxen (Aleve, etc)  Two 243m tabs twice a day ii. Ibuprofen (Advil, etc) Three 2040mtabs four times a day (every meal & bedtime) iii. Acetaminophen (Tylenol, etc) 500-65080mour times a day (every meal & bedtime) d. A  prescription for pain medication (such as oxycodone, hydrocodone, etc) should be given to you upon discharge.  Take your pain medication as prescribed.  i. If you are having problems/concerns with the prescription medicine (does not control pain, nausea, vomiting, rash, itching, etc), please call us [phone] see if we need to switch you to a different pain medicine that will work better for you and/or control your side effect better. ii. If you  need a refill on your pain medication, please contact your pharmacy.  They will contact our office to request authorization. Prescriptions will not be filled after 5 pm or on week-ends. 4. Watch out for diarrhea.  If you have a large amount of bowel movements (>1L per day), simplify your diet to bland foods & liquids for a few days.  Switching to mild anti-diarrheal medications (Kayopectate, Pepto Bismol) can help. Make sure you drink plenty of fluids.  If your urine becomes dark, it means you need more fluids.  If this high output worsens or does not improve, please call us [place] rather than later. 5. Wash / shower every day.  You may shower over the incision / wound.  Avoid baths until the skin is fully healed.  Continue to shower over incision(s) after the dressing is off. 6. You may leave the incision open to air.  You may replace a dressing/Band-Aid to cover the incision for comfort if you wish. 7. ACTIVITIES as tolerated:   a. You may resume regular (light) daily activities beginning the next day--such as daily self-care, walking, climbing stairs--gradually increasing activities as tolerated.  If you can walk 30 minutes without difficulty, it is safe to try more intense activity such as jogging, treadmill, bicycling, low-impact aerobics, swimming, etc. b. Save the most intensive and strenuous activity for last such as sit-ups, heavy lifting, contact sports, etc  Refrain from any heavy lifting or straining until you are off narcotics for pain control.   c. DO NOT  PUSH THROUGH PAIN.  Let pain be your guide: If it hurts to do something, don't do it.  Pain is your body warning you to avoid that activity for another week until the pain goes down. d. You may drive when you are no longer taking prescription pain medication, you can comfortably wear a seatbelt, and you can safely maneuver your car and apply brakes. e. Dennis Bast may have sexual intercourse when it is comfortable.  8. FOLLOW UP in our  office a. Please call [facility] at [phone] to set up an appointment to see your surgeon in the office for a follow-up appointment approximately 1-2 weeks after your surgery. b. Make sure that you call for this appointment the day you arrive home to insure a convenient appointment time. 10. IF YOU HAVE DISABILITY OR FAMILY LEAVE FORMS, BRING THEM TO THE OFFICE FOR PROCESSING.  DO NOT GIVE THEM TO YOUR DOCTOR.   WHEN TO CALL us [phone]: 1. Poor pain control 2. Reactions / problems with new medications (rash/itching, nausea, etc)  3. Fever over 101.5 F (38.5 C) 4. Inability to urinate 5. Nausea and/or vomiting 6. Worsening swelling or bruising 7. Continued bleeding from incision. 8. Increased pain, redness, or drainage from the incision  The clinic staff is available to answer your questions during regular business hours (8:30am-5pm).  Please dont hesitate to call and ask to speak to one of our nurses for clinical concerns.   A surgeon from [facility] is always on call at the hospitals   If you have a medical emergency, go to the nearest emergency room or call 911.    [facility], [place], [place], [place], [place]  [address] ? MAIN: [phone] ? TOLL FREE: [phone] ? FAX [phone] [email or web address]

## 2015-10-01 NOTE — Consult Note (Signed)
WOC ostomy follow up Stoma type/location: RUQ ileostomy Stomal assessment/size: not seen today.  Pouch applied yesterday is intact. Peristomal assessment: See above Treatment options for stomal/peristomal skin: Skin barrier ring Output brown stool, soft, pasty Ostomy pouching: 2pc.2 and 1/4 inch with skin barrier ring  Education provided: Patient and husband are asking a few questions remaining today and I reiterate that they should contact the [place] nurse if they have any questions.  HHRN called during my visit and will see tomorrow for pouch change as patient does not wish to change on Thursday due to the holiday.  Enrolled patient in [place] Start Discharge program: Yes Patient is ready for discharge.  Will wait for next dose of pain med and have lunch prior to leaving. [place] nursing team will remain available to this patient, the nursing and medical teams.  Thank you for allowing [place] to assist with this nice patient's care. Thanks, Maudie Flakes, MSN, RN, [place], Arther Abbott  Pager# [phone]

## 2015-10-01 NOTE — Progress Notes (Signed)
Hypoglycemic Event  CBG: 64  Treatment: Orange Juice  Symptoms: None  Follow-up CBG: Time: 0840 CBG Result: 90  Possible Reasons for Event: Unknown   Chee Dimon, Kathryne Hitch

## 2015-10-01 NOTE — Discharge Summary (Signed)
Physician Discharge Summary  Patient ID: Latasha Wood MRN: [id number] DOB/AGE: 1956-09-08 59 y.o.  Admit date: 09/25/2015 Discharge date: 10/01/2015  Admission Diagnoses: Ulcerative colitis  Discharge Diagnoses:  Active Problems:   Ulcerative colitis [facility])   Discharged Condition: good  Hospital Course: Patient admitted after surgery.  She had a fever on POD 2, but otherwise, her vitals remained stable.  Her WBC drifted down slowly after surgery.  Her foley was removed on POD 2.  She urinated without difficulty.  Her diet was advanced slowly.  By POD 6 she was tolerating a diet, and had good control of her ostomy, her pain was controlled with PO pain meds and she was ambulating without difficulty.  She was in stable condition for discharge.  Consults: None  Significant Diagnostic Studies: labs: CBC, chemistry  Treatments: IV hydration, analgesia: acetaminophen w/ codeine and surgery: robotic assisted total proctocolectomy  Discharge Exam: Blood pressure 136/83, pulse 72, temperature 98 F (36.7 C), temperature source Oral, resp. rate 16, height 5' 3.5" (1.613 m), weight 89.018 kg (196 lb 4 oz), SpO2 100 %. General appearance: alert and cooperative GI: normal findings: soft, nontender Incision/Wound:clean, dry, intact  Disposition: 01-Home or Self Care     Medication List    STOP taking these medications        mesalamine 1.2 G EC tablet  Commonly known as:  LIALDA      TAKE these medications        acetaminophen 650 MG CR tablet  Commonly known as:  TYLENOL  Take 650 mg by mouth every 8 (eight) hours as needed for pain.     ALPRAZolam 0.25 MG tablet  Commonly known as:  XANAX  Take 1 tablet (0.25 mg total) by mouth 2 (two) times daily as needed for anxiety.     aspirin 81 MG chewable tablet  Chew 81 mg by mouth daily as needed for headache.     BENICAR HCT 40-12.5 MG tablet  Generic drug:  olmesartan-hydrochlorothiazide  Take 1 tablet by mouth  daily      multivitamin with minerals Tabs tablet  Take 1 tablet by mouth daily.     oxyCODONE-acetaminophen 5-325 MG tablet  Commonly known as:  PERCOCET/ROXICET  Take 1-2 tablets by mouth every 4 (four) hours as needed for moderate pain.     potassium chloride SA 20 MEQ tablet  Commonly known as:  K-DUR,KLOR-CON  Take 1 tablet (20 mEq total) by mouth daily.     predniSONE 10 MG tablet  Commonly known as:  DELTASONE  Take 1 tablet (10 mg total) by mouth daily with breakfast.           Follow-up Information    Follow up with [facility].   Why:  nurse for ostomy care   Contact information:   [address] [place] [address] [phone]       Follow up with Rosario Adie., MD. Schedule an appointment as soon as possible for a visit in 2 weeks.   Specialty:  General Surgery   Contact information:   [address] [place]  [address] [phone]       Signed: Rosario Adie 67/59/1638, 8:45 AM

## 2015-10-02 ENCOUNTER — Encounter (HOSPITAL_COMMUNITY): Payer: 59

## 2015-10-07 ENCOUNTER — Inpatient Hospital Stay (HOSPITAL_COMMUNITY)
Admission: AD | Admit: 2015-10-07 | Discharge: 2015-10-24 | DRG: 330 | Disposition: A | Discharge status: Home Health | Payer: 59 | Source: Ambulatory Visit | Attending: General Surgery | Admitting: General Surgery

## 2015-10-07 ENCOUNTER — Other Ambulatory Visit: Payer: Self-pay | Admitting: General Surgery

## 2015-10-07 ENCOUNTER — Encounter (HOSPITAL_COMMUNITY): Payer: Self-pay

## 2015-10-07 DIAGNOSIS — Z6834 Body mass index (BMI) 34.0-34.9, adult: Secondary | ICD-10-CM | POA: Diagnosis not present

## 2015-10-07 DIAGNOSIS — L988 Other specified disorders of the skin and subcutaneous tissue: Secondary | ICD-10-CM | POA: Diagnosis present

## 2015-10-07 DIAGNOSIS — F419 Anxiety disorder, unspecified: Secondary | ICD-10-CM | POA: Diagnosis present

## 2015-10-07 DIAGNOSIS — Z833 Family history of diabetes mellitus: Secondary | ICD-10-CM

## 2015-10-07 DIAGNOSIS — E46 Unspecified protein-calorie malnutrition: Secondary | ICD-10-CM | POA: Diagnosis present

## 2015-10-07 DIAGNOSIS — E7800 Pure hypercholesterolemia, unspecified: Secondary | ICD-10-CM | POA: Diagnosis present

## 2015-10-07 DIAGNOSIS — D62 Acute posthemorrhagic anemia: Secondary | ICD-10-CM | POA: Diagnosis not present

## 2015-10-07 DIAGNOSIS — R300 Dysuria: Secondary | ICD-10-CM | POA: Diagnosis not present

## 2015-10-07 DIAGNOSIS — K9419 Other complications of enterostomy: Secondary | ICD-10-CM

## 2015-10-07 DIAGNOSIS — R040 Epistaxis: Secondary | ICD-10-CM | POA: Diagnosis not present

## 2015-10-07 DIAGNOSIS — Z8249 Family history of ischemic heart disease and other diseases of the circulatory system: Secondary | ICD-10-CM

## 2015-10-07 DIAGNOSIS — Z01812 Encounter for preprocedural laboratory examination: Secondary | ICD-10-CM

## 2015-10-07 DIAGNOSIS — Z7952 Long term (current) use of systemic steroids: Secondary | ICD-10-CM | POA: Diagnosis not present

## 2015-10-07 DIAGNOSIS — D510 Vitamin B12 deficiency anemia due to intrinsic factor deficiency: Secondary | ICD-10-CM | POA: Diagnosis present

## 2015-10-07 LAB — CBC
HCT: 25.9 % — ABNORMAL LOW (ref 36.0–46.0)
Hemoglobin: 8.3 g/dL — ABNORMAL LOW (ref 12.0–15.0)
MCH: 28.4 pg (ref 26.0–34.0)
MCHC: 32 g/dL (ref 30.0–36.0)
MCV: 88.7 fL (ref 78.0–100.0)
PLATELETS: 687 10*3/uL — AB (ref 150–400)
RBC: 2.92 MIL/uL — ABNORMAL LOW (ref 3.87–5.11)
RDW: 14.2 % (ref 11.5–15.5)
WBC: 17.9 10*3/uL — AB (ref 4.0–10.5)

## 2015-10-07 LAB — BASIC METABOLIC PANEL
Anion gap: 9 (ref 5–15)
BUN: 27 mg/dL — AB (ref 6–20)
CALCIUM: 9.3 mg/dL (ref 8.9–10.3)
CO2: 28 mmol/L (ref 22–32)
CREATININE: 1.09 mg/dL — AB (ref 0.44–1.00)
Chloride: 98 mmol/L — ABNORMAL LOW (ref 101–111)
GFR calc Af Amer: >60 mL/min (ref >60)
GFR, EST NON AFRICAN AMERICAN: 54 mL/min — AB (ref >60)
GLUCOSE: 122 mg/dL — AB (ref 65–99)
Potassium: 3.8 mmol/L (ref 3.5–5.1)
SODIUM: 135 mmol/L (ref 135–145)

## 2015-10-07 MED ORDER — ALPRAZOLAM 0.25 MG PO TABS
0.2500 mg | ORAL_TABLET | Freq: Two times a day (BID) | ORAL | Status: DC | PRN
  Administered 2015-10-07: 0.25 mg via ORAL
  Filled 2015-10-07: qty 1

## 2015-10-07 MED ORDER — ONDANSETRON 4 MG PO TBDP
4.0000 mg | ORAL_TABLET | Freq: Four times a day (QID) | ORAL | Status: DC | PRN
  Administered 2015-10-18 – 2015-10-23 (×4): 4 mg via ORAL
  Filled 2015-10-07 (×4): qty 1

## 2015-10-07 MED ORDER — ONDANSETRON HCL 4 MG/2ML IJ SOLN
4.0000 mg | Freq: Four times a day (QID) | INTRAMUSCULAR | Status: DC | PRN
  Administered 2015-10-11 – 2015-10-23 (×11): 4 mg via INTRAVENOUS
  Filled 2015-10-07 (×11): qty 2

## 2015-10-07 MED ORDER — ERTAPENEM SODIUM 1 G IJ SOLR
1.0000 g | INTRAMUSCULAR | Status: DC
  Administered 2015-10-07: 1 g via INTRAVENOUS
  Filled 2015-10-07: qty 1

## 2015-10-07 MED ORDER — KCL IN DEXTROSE-NACL 20-5-0.45 MEQ/L-%-% IV SOLN
INTRAVENOUS | Status: AC
  Administered 2015-10-07 – 2015-10-09 (×2): via INTRAVENOUS
  Filled 2015-10-07 (×6): qty 1000

## 2015-10-07 MED ORDER — PREDNISONE 5 MG PO TABS
5.0000 mg | ORAL_TABLET | Freq: Every day | ORAL | Status: DC
  Administered 2015-10-09 – 2015-10-19 (×11): 5 mg via ORAL
  Filled 2015-10-07 (×14): qty 1

## 2015-10-07 MED ORDER — OXYCODONE-ACETAMINOPHEN 5-325 MG PO TABS
1.0000 | ORAL_TABLET | ORAL | Status: DC | PRN
  Administered 2015-10-07: 2 via ORAL
  Filled 2015-10-07: qty 2

## 2015-10-07 MED ORDER — ENOXAPARIN SODIUM 40 MG/0.4ML ~~LOC~~ SOLN
40.0000 mg | SUBCUTANEOUS | Status: DC
  Administered 2015-10-07 – 2015-10-23 (×16): 40 mg via SUBCUTANEOUS
  Filled 2015-10-07 (×18): qty 0.4

## 2015-10-07 MED ORDER — POTASSIUM CHLORIDE CRYS ER 20 MEQ PO TBCR
20.0000 meq | EXTENDED_RELEASE_TABLET | Freq: Every day | ORAL | Status: DC
  Filled 2015-10-07: qty 1

## 2015-10-07 MED ORDER — MORPHINE SULFATE (PF) 2 MG/ML IV SOLN
2.0000 mg | INTRAVENOUS | Status: DC | PRN
  Administered 2015-10-08: 4 mg via INTRAVENOUS
  Administered 2015-10-08: 2 mg via INTRAVENOUS
  Administered 2015-10-08: 4 mg via INTRAVENOUS
  Administered 2015-10-08: 2 mg via INTRAVENOUS
  Administered 2015-10-08 – 2015-10-09 (×5): 4 mg via INTRAVENOUS
  Administered 2015-10-10: 2 mg via INTRAVENOUS
  Administered 2015-10-10: 4 mg via INTRAVENOUS
  Administered 2015-10-10 – 2015-10-15 (×8): 2 mg via INTRAVENOUS
  Administered 2015-10-16 (×2): 4 mg via INTRAVENOUS
  Administered 2015-10-17 (×2): 2 mg via INTRAVENOUS
  Administered 2015-10-18: 4 mg via INTRAVENOUS
  Administered 2015-10-19 – 2015-10-20 (×5): 2 mg via INTRAVENOUS
  Filled 2015-10-07 (×2): qty 1
  Filled 2015-10-07: qty 2
  Filled 2015-10-07 (×4): qty 1
  Filled 2015-10-07 (×2): qty 2
  Filled 2015-10-07 (×2): qty 1
  Filled 2015-10-07: qty 2
  Filled 2015-10-07: qty 1
  Filled 2015-10-07: qty 2
  Filled 2015-10-07 (×2): qty 1
  Filled 2015-10-07 (×2): qty 2
  Filled 2015-10-07 (×2): qty 1
  Filled 2015-10-07: qty 2
  Filled 2015-10-07 (×4): qty 1
  Filled 2015-10-07 (×3): qty 2
  Filled 2015-10-07 (×2): qty 1
  Filled 2015-10-07: qty 2

## 2015-10-07 NOTE — Progress Notes (Signed)
Patient is alert and oriented. Vs stable. No s/s of acute distress. Ileostomy "grey" in color. Percocet 66m given for pain at 1424 with relief. Spoke with Dr.Thomas, for verification to administer the lovenox, lovenox given today. Will continue to monitor. Consent obtained for surgery tomorrow.

## 2015-10-07 NOTE — H&P (Signed)
Lyrick C. Burgen 10/07/2015 10:42 AM Location: [facility] Patient #: [id number] DOB: 1956-01-26 Married / Language: English / Race: Black or African American Female  History of Present Illness Leighton Ruff MD; 93/26/7124 12:16 PM) Patient words: post-op.  The patient is a 59 year old female presenting for a post-operative visit. 59 year old female status post total proctocolectomy with small bowel resection who presents to the office with worsening pain around her ostomy site. She is noted separation of the suture line as well. This has occurred over the past few days. She is still having bowel movements and denies any nausea or vomiting. She denies any fevers or major bleeding from the ostomy.   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) CHRONIC ULCERATIVE COLITIS WITHOUT COMPLICATION (A25.05)  Other Problems Leighton Ruff, MD; 39/76/7341 12:21 PM) High blood pressure Hypercholesterolemia Ulcerative Colitis  Past Surgical History Leighton Ruff, MD; 93/79/0240 12:21 PM) Colon Polyp Removal - Colonoscopy Hysterectomy (not due to cancer) - Partial  Diagnostic Studies History Leighton Ruff, MD; 97/35/3299 12:21 PM) Mammogram within last year Pap Smear >5 years ago Colonoscopy within last year  Allergies Malachi Bonds, CMA; 10/07/2015 10:42 AM) No Known Drug Allergies 05/14/2015  Medication History (Chemira Jones, CMA; 10/07/2015 10:43 AM) Glycopyrrolate (2MG Tablet, Oral) Active. Potassium Chloride Crys ER (20MEQ Tablet ER, Oral) Active. PredniSONE (10MG Tablet, Oral) Active. Oscal 500/200 D-3 (500-200MG-UNIT Tablet, Oral) Active. Ultram (50MG Tablet, Oral as needed) Active. Zofran (4MG Tablet, Oral as needed) Active. Lialda (1.2GM Tablet DR, Oral) Active. Benicar HCT (40-12.5MG Tablet, Oral) Active. ALPRAZolam (0.25MG Tablet, Oral) Active. Remicade (100MG For Solution,  Intravenous) Active. Medications Reconciled  Social History Leighton Ruff, MD; 24/26/8341 12:21 PM) Alcohol use Occasional alcohol use. Caffeine use Carbonated beverages, Coffee, Tea. No drug use Tobacco use Never smoker.  Family History Leighton Ruff, MD; 96/22/2979 12:21 PM) Arthritis Mother. Alcohol Abuse Mother. Diabetes Mellitus Sister. Depression Mother, Sister. Colon Polyps Brother. Heart Disease Father. Hypertension Brother, Father, Mother, Sister. Breast Cancer Mother, Sister. Ischemic Bowel Disease Brother, Sister.  Pregnancy / Birth History Leighton Ruff, MD; 89/21/1942 12:21 PM) Age of menopause 30-60 Gravida 67 Age at menarche 42 years. Maternal age 42-20 Para 2 Irregular periods     Review of Systems Leighton Ruff MD; 74/06/1447 12:21 PM) Skin Not Present- Bruising, Dryness and Hair Loss. Respiratory Not Present- Chronic Cough, Decreased Exercise Tolerance, Difficulty Breathing and Sputum Production. Cardiovascular Present- Shortness of Breath and Swelling of Extremities. Not Present- Chest Pain, Difficulty Breathing Lying Down, Leg Cramps, Palpitations and Rapid Heart Rate. Gastrointestinal Present- Abdominal Pain and Bloating. Not Present- Bloody Stool, Change in Bowel Habits, Chronic diarrhea, Constipation, Difficulty Swallowing, Excessive gas, Gets full quickly at meals, Hemorrhoids, Indigestion, Nausea, Rectal Pain and Vomiting. Female Genitourinary Not Present- Blood in Urine, Change In Bladder Habits and Difficulty Emptying Bladder. Musculoskeletal Present- Joint Pain and Joint Stiffness. Not Present- Back Pain and Joint Redness. Neurological Not Present- Decreased Memory, Difficulty Speaking and Dizziness.  Vitals (Chemira Jones CMA; 10/07/2015 10:42 AM) 10/07/2015 10:42 AM Weight: 188.4 lb Height: 64in Body Surface Area: 1.91 m Body Mass Index: 32.34 kg/m  Temp.: 98.53F(Oral)  Pulse: 78 (Regular)  BP: 118/80  (Sitting, Left Arm, Standard)      Physical Exam Leighton Ruff MD; 18/56/3149 12:18 PM)  General Mental Status-Alert. General Appearance-Consistent with stated age. Hydration-Well hydrated. Voice-Normal.  Head and Neck Head-normocephalic, atraumatic with no lesions or palpable masses. Trachea-midline.  Eye Eyeball - Bilateral-Extraocular movements intact. Sclera/Conjunctiva - Bilateral-No scleral  icterus.  Chest and Lung Exam Chest and lung exam reveals -quiet, even and easy respiratory effort with no use of accessory muscles and on auscultation, normal breath sounds, no adventitious sounds and normal vocal resonance. Inspection Chest Wall - Normal. Back - normal.  Cardiovascular Cardiovascular examination reveals -normal heart sounds, regular rate and rhythm with no murmurs and normal pedal pulses bilaterally.  Abdomen Inspection Inspection of the abdomen reveals - No Hernias. Skin - Scar - Note: Pfannenstiel incision clean, dry, intact JP site still open and draining serous fluid Ileostomy appliance removed. Entire stoma is necrotic and falling away. It appears the deeper layers are still intact but I did not probe this due to concern of rupture. Palpation/Percussion Palpation and Percussion of the abdomen reveal - Soft, Non Tender, No Rebound tenderness, No Rigidity (guarding) and No hepatosplenomegaly.  Neurologic Neurologic evaluation reveals -alert and oriented x 3 with no impairment of recent or remote memory. Mental Status-Normal.  Musculoskeletal Normal Exam - Left-Upper Extremity Strength Normal and Lower Extremity Strength Normal. Normal Exam - Right-Upper Extremity Strength Normal and Lower Extremity Strength Normal.    Assessment & Plan Leighton Ruff MD; 35/67/0141 12:20 PM)  NECROSIS OF ILEOSTOMY (K94.19) Impression: 59 year old female approximately 2 weeks status post total abdominal proctectomy and presents to the office  with frank necrosis of her ileostomy. I am concerned that this may continue to retract and fall into her abdomen. I have recommended direct admission today with clear liquids and IV pain control. We will plan on doing a ileostomy revision tomorrow afternoon in the operating room. Hopefully we can do this without opening up her abdomen. Risks include bleeding, pain and need for additional procedures and surgery.

## 2015-10-08 ENCOUNTER — Inpatient Hospital Stay (HOSPITAL_COMMUNITY): Payer: 59 | Admitting: Certified Registered Nurse Anesthetist

## 2015-10-08 ENCOUNTER — Encounter (HOSPITAL_COMMUNITY)
Admission: AD | Disposition: A | Discharge status: Home Health | Payer: Self-pay | Source: Ambulatory Visit | Attending: General Surgery

## 2015-10-08 ENCOUNTER — Encounter (HOSPITAL_COMMUNITY): Payer: Self-pay | Admitting: Certified Registered Nurse Anesthetist

## 2015-10-08 HISTORY — PX: ILEOSTOMY CLOSURE: SHX1784

## 2015-10-08 LAB — BASIC METABOLIC PANEL
Anion gap: 6 (ref 5–15)
BUN: 14 mg/dL (ref 6–20)
CALCIUM: 8.8 mg/dL — AB (ref 8.9–10.3)
CO2: 30 mmol/L (ref 22–32)
Chloride: 101 mmol/L (ref 101–111)
Creatinine, Ser: 0.9 mg/dL (ref 0.44–1.00)
GFR calc Af Amer: >60 mL/min (ref >60)
GLUCOSE: 89 mg/dL (ref 65–99)
Potassium: 4.1 mmol/L (ref 3.5–5.1)
Sodium: 137 mmol/L (ref 135–145)

## 2015-10-08 LAB — CBC
HEMATOCRIT: 23.1 % — AB (ref 36.0–46.0)
Hemoglobin: 7.5 g/dL — ABNORMAL LOW (ref 12.0–15.0)
MCH: 29.3 pg (ref 26.0–34.0)
MCHC: 32.5 g/dL (ref 30.0–36.0)
MCV: 90.2 fL (ref 78.0–100.0)
Platelets: 613 10*3/uL — ABNORMAL HIGH (ref 150–400)
RBC: 2.56 MIL/uL — ABNORMAL LOW (ref 3.87–5.11)
RDW: 14.4 % (ref 11.5–15.5)
WBC: 12.4 10*3/uL — ABNORMAL HIGH (ref 4.0–10.5)

## 2015-10-08 LAB — GLUCOSE, CAPILLARY: GLUCOSE-CAPILLARY: 142 mg/dL — AB (ref 65–99)

## 2015-10-08 SURGERY — CLOSURE, ILEOSTOMY
Anesthesia: General | Site: Abdomen

## 2015-10-08 MED ORDER — ROCURONIUM BROMIDE 100 MG/10ML IV SOLN
INTRAVENOUS | Status: DC | PRN
  Administered 2015-10-08: 5 mg via INTRAVENOUS
  Administered 2015-10-08: 20 mg via INTRAVENOUS
  Administered 2015-10-08: 5 mg via INTRAVENOUS
  Administered 2015-10-08: 40 mg via INTRAVENOUS

## 2015-10-08 MED ORDER — HYDROMORPHONE HCL 1 MG/ML IJ SOLN
INTRAMUSCULAR | Status: AC
  Filled 2015-10-08: qty 1

## 2015-10-08 MED ORDER — HYDROMORPHONE HCL 1 MG/ML IJ SOLN
0.2500 mg | INTRAMUSCULAR | Status: DC | PRN
  Administered 2015-10-08 (×3): 0.5 mg via INTRAVENOUS

## 2015-10-08 MED ORDER — FENTANYL CITRATE (PF) 100 MCG/2ML IJ SOLN
INTRAMUSCULAR | Status: AC
  Filled 2015-10-08: qty 2

## 2015-10-08 MED ORDER — METOPROLOL TARTRATE 1 MG/ML IV SOLN
INTRAVENOUS | Status: DC | PRN
  Administered 2015-10-08: 1 mg via INTRAVENOUS
  Administered 2015-10-08: 3 mg via INTRAVENOUS
  Administered 2015-10-08: 1 mg via INTRAVENOUS

## 2015-10-08 MED ORDER — GLYCOPYRROLATE 0.2 MG/ML IJ SOLN
INTRAMUSCULAR | Status: AC
  Filled 2015-10-08: qty 3

## 2015-10-08 MED ORDER — MIDAZOLAM HCL 2 MG/2ML IJ SOLN
INTRAMUSCULAR | Status: AC
  Filled 2015-10-08: qty 2

## 2015-10-08 MED ORDER — LABETALOL HCL 5 MG/ML IV SOLN
5.0000 mg | INTRAVENOUS | Status: DC | PRN
Start: 2015-10-08 — End: 2015-10-08
  Administered 2015-10-08 (×2): 5 mg via INTRAVENOUS

## 2015-10-08 MED ORDER — FENTANYL CITRATE (PF) 100 MCG/2ML IJ SOLN: 25.0000 ug | INTRAMUSCULAR | Status: DC | PRN

## 2015-10-08 MED ORDER — PROMETHAZINE HCL 25 MG/ML IJ SOLN
INTRAMUSCULAR | Status: AC
  Filled 2015-10-08: qty 1

## 2015-10-08 MED ORDER — FENTANYL CITRATE (PF) 100 MCG/2ML IJ SOLN
INTRAMUSCULAR | Status: DC | PRN
  Administered 2015-10-08 (×5): 50 ug via INTRAVENOUS
  Administered 2015-10-08: 100 ug via INTRAVENOUS
  Administered 2015-10-08 (×4): 50 ug via INTRAVENOUS

## 2015-10-08 MED ORDER — SODIUM CHLORIDE 0.9 % IJ SOLN
INTRAMUSCULAR | Status: AC
  Filled 2015-10-08: qty 10

## 2015-10-08 MED ORDER — METOPROLOL TARTRATE 1 MG/ML IV SOLN
INTRAVENOUS | Status: AC
  Filled 2015-10-08: qty 5

## 2015-10-08 MED ORDER — MIDAZOLAM HCL 5 MG/5ML IJ SOLN
INTRAMUSCULAR | Status: DC | PRN
  Administered 2015-10-08: 2 mg via INTRAVENOUS

## 2015-10-08 MED ORDER — ROCURONIUM BROMIDE 100 MG/10ML IV SOLN
INTRAVENOUS | Status: AC
  Filled 2015-10-08: qty 1

## 2015-10-08 MED ORDER — DEXAMETHASONE SODIUM PHOSPHATE 10 MG/ML IJ SOLN
INTRAMUSCULAR | Status: AC
  Filled 2015-10-08: qty 1

## 2015-10-08 MED ORDER — GLYCOPYRROLATE 0.2 MG/ML IJ SOLN
INTRAMUSCULAR | Status: DC | PRN
  Administered 2015-10-08: 0.6 mg via INTRAVENOUS

## 2015-10-08 MED ORDER — 0.9 % SODIUM CHLORIDE (POUR BTL) OPTIME
TOPICAL | Status: DC | PRN
  Administered 2015-10-08 (×2): 1000 mL

## 2015-10-08 MED ORDER — ONDANSETRON HCL 4 MG/2ML IJ SOLN
INTRAMUSCULAR | Status: DC | PRN
  Administered 2015-10-08: 4 mg via INTRAVENOUS

## 2015-10-08 MED ORDER — LABETALOL HCL 5 MG/ML IV SOLN
INTRAVENOUS | Status: AC
  Filled 2015-10-08: qty 4

## 2015-10-08 MED ORDER — NEOSTIGMINE METHYLSULFATE 10 MG/10ML IV SOLN
INTRAVENOUS | Status: DC | PRN
  Administered 2015-10-08: 4 mg via INTRAVENOUS

## 2015-10-08 MED ORDER — DEXAMETHASONE SODIUM PHOSPHATE 4 MG/ML IJ SOLN
INTRAMUSCULAR | Status: DC | PRN
  Administered 2015-10-08: 10 mg via INTRAVENOUS

## 2015-10-08 MED ORDER — LACTATED RINGERS IV SOLN: INTRAVENOUS | Status: DC

## 2015-10-08 MED ORDER — NEOSTIGMINE METHYLSULFATE 10 MG/10ML IV SOLN
INTRAVENOUS | Status: AC
  Filled 2015-10-08: qty 1

## 2015-10-08 MED ORDER — LACTATED RINGERS IV SOLN
INTRAVENOUS | Status: DC | PRN
  Administered 2015-10-08 (×2): via INTRAVENOUS

## 2015-10-08 MED ORDER — PROPOFOL 10 MG/ML IV BOLUS
INTRAVENOUS | Status: AC
  Filled 2015-10-08: qty 20

## 2015-10-08 MED ORDER — PROMETHAZINE HCL 25 MG/ML IJ SOLN
6.2500 mg | INTRAMUSCULAR | Status: DC | PRN
  Administered 2015-10-08: 12.5 mg via INTRAVENOUS

## 2015-10-08 MED ORDER — PROPOFOL 10 MG/ML IV BOLUS
INTRAVENOUS | Status: DC | PRN
  Administered 2015-10-08: 150 mg via INTRAVENOUS

## 2015-10-08 MED ORDER — LACTATED RINGERS IV SOLN
INTRAVENOUS | Status: DC
  Administered 2015-10-08: 17:00:00 via INTRAVENOUS

## 2015-10-08 MED ORDER — FENTANYL CITRATE (PF) 250 MCG/5ML IJ SOLN
INTRAMUSCULAR | Status: AC
  Filled 2015-10-08: qty 5

## 2015-10-08 MED ORDER — ONDANSETRON HCL 4 MG/2ML IJ SOLN
INTRAMUSCULAR | Status: AC
  Filled 2015-10-08: qty 2

## 2015-10-08 SURGICAL SUPPLY — 60 items
BLADE HEX COATED 2.75 (ELECTRODE) IMPLANT
CHLORAPREP W/TINT 26ML (MISCELLANEOUS) IMPLANT
CLOSURE WOUND 1/2 X4 (GAUZE/BANDAGES/DRESSINGS) ×1
COVER MAYO STAND STRL (DRAPES) IMPLANT
COVER SURGICAL LIGHT HANDLE (MISCELLANEOUS) ×3 IMPLANT
DRAPE LAPAROSCOPIC ABDOMINAL (DRAPES) ×3 IMPLANT
DRAPE UTILITY XL STRL (DRAPES) IMPLANT
DRAPE WARM FLUID 44X44 (DRAPE) ×3 IMPLANT
ELECT BLADE TIP CTD 4 INCH (ELECTRODE) ×3 IMPLANT
ELECT REM PT RETURN 9FT ADLT (ELECTROSURGICAL) ×3
ELECTRODE REM PT RTRN 9FT ADLT (ELECTROSURGICAL) ×1 IMPLANT
GAUZE SPONGE 4X4 12PLY STRL (GAUZE/BANDAGES/DRESSINGS) IMPLANT
GLOVE BIO SURGEON STRL SZ 6.5 (GLOVE) ×4 IMPLANT
GLOVE BIO SURGEON STRL SZ8.5 (GLOVE) ×3 IMPLANT
GLOVE BIO SURGEONS STRL SZ 6.5 (GLOVE) ×2
GLOVE BIOGEL PI IND STRL 6.5 (GLOVE) ×2 IMPLANT
GLOVE BIOGEL PI IND STRL 7.0 (GLOVE) ×2 IMPLANT
GLOVE BIOGEL PI IND STRL 7.5 (GLOVE) ×2 IMPLANT
GLOVE BIOGEL PI IND STRL 8.5 (GLOVE) ×1 IMPLANT
GLOVE BIOGEL PI INDICATOR 6.5 (GLOVE) ×4
GLOVE BIOGEL PI INDICATOR 7.0 (GLOVE) ×4
GLOVE BIOGEL PI INDICATOR 7.5 (GLOVE) ×4
GLOVE BIOGEL PI INDICATOR 8.5 (GLOVE) ×2
GLOVE ECLIPSE 7.5 STRL STRAW (GLOVE) ×3 IMPLANT
GLOVE SURG SS PI 6.5 STRL IVOR (GLOVE) ×6 IMPLANT
GLOVE SURG SS PI 7.5 STRL IVOR (GLOVE) ×3 IMPLANT
GOWN L4 XXLG W/PAP TWL (GOWN DISPOSABLE) ×3 IMPLANT
GOWN PREVENTION PLUS XLARGE (GOWN DISPOSABLE) ×3 IMPLANT
GOWN STRL REUS W/TWL XL LVL3 (GOWN DISPOSABLE) ×15 IMPLANT
KIT BASIN OR (CUSTOM PROCEDURE TRAY) ×3 IMPLANT
LIGASURE IMPACT 36 18CM CVD LR (INSTRUMENTS) IMPLANT
MANIFOLD NEPTUNE II (INSTRUMENTS) ×3 IMPLANT
PACK GENERAL/GYN (CUSTOM PROCEDURE TRAY) ×3 IMPLANT
POUCH DRAINABLE 1PC 2 1/4 FLAT (OSTOMY) ×3 IMPLANT
SPONGE LAP 18X18 X RAY DECT (DISPOSABLE) ×3 IMPLANT
STAPLER PROXIMATE 75MM BLUE (STAPLE) ×3 IMPLANT
STAPLER VISISTAT 35W (STAPLE) IMPLANT
STRIP CLOSURE SKIN 1/2X4 (GAUZE/BANDAGES/DRESSINGS) ×2 IMPLANT
SUCTION POOLE TIP (SUCTIONS) IMPLANT
SUT NOVA 1 T20/GS 25DT (SUTURE) ×9 IMPLANT
SUT PDS AB 1 CTX 36 (SUTURE) IMPLANT
SUT PDS AB 1 TP1 96 (SUTURE) IMPLANT
SUT PROLENE 2 0 BLUE (SUTURE) IMPLANT
SUT SILK 2 0 (SUTURE)
SUT SILK 2 0 SH CR/8 (SUTURE) ×3 IMPLANT
SUT SILK 2 0SH CR/8 30 (SUTURE) IMPLANT
SUT SILK 2-0 18XBRD TIE 12 (SUTURE) IMPLANT
SUT SILK 2-0 30XBRD TIE 12 (SUTURE) IMPLANT
SUT SILK 3 0 (SUTURE)
SUT SILK 3 0 SH CR/8 (SUTURE) ×12 IMPLANT
SUT SILK 3-0 18XBRD TIE 12 (SUTURE) IMPLANT
SUT VIC AB 2-0 SH 18 (SUTURE) ×3 IMPLANT
SUT VIC AB 2-0 SH 27 (SUTURE)
SUT VIC AB 2-0 SH 27X BRD (SUTURE) IMPLANT
SUT VIC AB 3-0 SH 18 (SUTURE) ×3 IMPLANT
SUT VICRYL 0 UR6 27IN ABS (SUTURE) ×3 IMPLANT
TOWEL OR 17X26 10 PK STRL BLUE (TOWEL DISPOSABLE) ×6 IMPLANT
TOWEL OR NON WOVEN STRL DISP B (DISPOSABLE) IMPLANT
TRAY FOLEY W/METER SILVER 14FR (SET/KITS/TRAYS/PACK) ×3 IMPLANT
YANKAUER SUCT BULB TIP NO VENT (SUCTIONS) ×3 IMPLANT

## 2015-10-08 NOTE — Progress Notes (Signed)
[facility]  Patient Status: Active (receiving services up to time of hospitalization)  [facility] is providing the following services: RN  If patient discharges after hours, please call [phone].   Latasha Wood 10/08/2015, 11:00 AM

## 2015-10-08 NOTE — Op Note (Deleted)
Latasha Wood [id number] June 02, 1956 10/08/2015  Preoperative diagnosis: morbid obesity for sleeve gastrectomy  Postoperative diagnosis: Same   Procedure: Upper endoscopy   Surgeon: Catalina Antigua B. Hassell Done  M.D., FACS   Anesthesia: Gen.   Indications for procedure: This patient was undergoing a sleeve gastrecctomy.    Description of procedure: The endoscopy was placed in the mouth and into the oropharynx and under endoscopic vision it was advanced to the esophagogastric junction.  The pouch was insufflated and the scope was passed into the antrum.  The geometry of the sleeve was cylindrical.  .   No bleeding or leaks were detected.  The scope was withdrawn without difficulty.     Matt B. Hassell Done, MD, FACS General, Bariatric, & Minimally Invasive Surgery [facility], [place]

## 2015-10-08 NOTE — Anesthesia Postprocedure Evaluation (Signed)
Anesthesia Post Note  Patient: Latasha Wood  Procedure(s) Performed: Procedure(s) (LRB): ILEOSTOMY REVISION (N/A)  Patient location during evaluation: PACU Anesthesia Type: General Level of consciousness: awake and alert Pain management: pain level controlled Vital Signs Assessment: post-procedure vital signs reviewed and stable Respiratory status: spontaneous breathing, nonlabored ventilation, respiratory function stable and patient connected to nasal cannula oxygen Cardiovascular status: blood pressure returned to baseline and stable Postop Assessment: no signs of nausea or vomiting Anesthetic complications: no    Last Vitals:  Filed Vitals:   10/08/15 1700 10/08/15 1715  BP: 189/98 182/93  Pulse: 90 84  Temp:    Resp: 16 10    Last Pain:  Filed Vitals:   10/08/15 1719  PainSc: Asleep                 Stephens Shreve L

## 2015-10-08 NOTE — Progress Notes (Signed)
Necrosis of ileostomy ([place])  Subjective: Pt unchanged overnight, still having pain around the ostomy  Objective: Vital signs in last 24 hours: Temp:  [98.1 F (36.7 C)-99.1 F (37.3 C)] 99.1 F (37.3 C) (11/29 0600) Pulse Rate:  [76-96] 77 (11/29 0600) Resp:  [15-16] 16 (11/29 0600) BP: (110-124)/(68-76) 121/68 mmHg (11/29 0600) SpO2:  [100 %] 100 % (11/29 0600) Weight:  [89 kg (196 lb 3.4 oz)] 89 kg (196 lb 3.4 oz) (11/28 1253) Last BM Date: 10/07/15  Intake/Output from previous day: 11/28 0701 - 11/29 0700 In: 1220 [I.V.:1170; IV Piggyback:50] Out: 1900 [Urine:1550; Stool:350] Intake/Output this shift:    General appearance: alert and cooperative GI: soft, no peritoneal signs Incision/Wound: clean  Lab Results:  Results for orders placed or performed during the hospital encounter of 10/07/15 (from the past 24 hour(s))  Basic metabolic panel     Status: Abnormal   Collection Time: 10/07/15  1:34 PM  Result Value Ref Range   Sodium 135 135 - 145 mmol/L   Potassium 3.8 3.5 - 5.1 mmol/L   Chloride 98 (L) 101 - 111 mmol/L   CO2 28 22 - 32 mmol/L   Glucose, Bld 122 (H) 65 - 99 mg/dL   BUN 27 (H) 6 - 20 mg/dL   Creatinine, Ser 1.09 (H) 0.44 - 1.00 mg/dL   Calcium 9.3 8.9 - 10.3 mg/dL   GFR calc non Af Amer 54 (L) >60 mL/min   GFR calc Af Amer >60 >60 mL/min   Anion gap 9 5 - 15  CBC     Status: Abnormal   Collection Time: 10/07/15  1:34 PM  Result Value Ref Range   WBC 17.9 (H) 4.0 - 10.5 K/uL   RBC 2.92 (L) 3.87 - 5.11 MIL/uL   Hemoglobin 8.3 (L) 12.0 - 15.0 g/dL   HCT 25.9 (L) 36.0 - 46.0 %   MCV 88.7 78.0 - 100.0 fL   MCH 28.4 26.0 - 34.0 pg   MCHC 32.0 30.0 - 36.0 g/dL   RDW 14.2 11.5 - 15.5 %   Platelets 687 (H) 150 - 400 K/uL  Basic metabolic panel     Status: Abnormal   Collection Time: 10/08/15  5:42 AM  Result Value Ref Range   Sodium 137 135 - 145 mmol/L   Potassium 4.1 3.5 - 5.1 mmol/L   Chloride 101 101 - 111 mmol/L   CO2 30 22 - 32 mmol/L   Glucose, Bld 89 65 - 99 mg/dL   BUN 14 6 - 20 mg/dL   Creatinine, Ser 0.90 0.44 - 1.00 mg/dL   Calcium 8.8 (L) 8.9 - 10.3 mg/dL   GFR calc non Af Amer >60 >60 mL/min   GFR calc Af Amer >60 >60 mL/min   Anion gap 6 5 - 15  CBC     Status: Abnormal   Collection Time: 10/08/15  5:42 AM  Result Value Ref Range   WBC 12.4 (H) 4.0 - 10.5 K/uL   RBC 2.56 (L) 3.87 - 5.11 MIL/uL   Hemoglobin 7.5 (L) 12.0 - 15.0 g/dL   HCT 23.1 (L) 36.0 - 46.0 %   MCV 90.2 78.0 - 100.0 fL   MCH 29.3 26.0 - 34.0 pg   MCHC 32.5 30.0 - 36.0 g/dL   RDW 14.4 11.5 - 15.5 %   Platelets 613 (H) 150 - 400 K/uL     Studies/Results Radiology     MEDS, Scheduled . enoxaparin (LOVENOX) injection  40 mg Subcutaneous Q24H  .  ertapenem  1 g Intravenous Q24H  . potassium chloride SA  20 mEq Oral Daily  . predniSONE  5 mg Oral Q breakfast     Assessment: Necrosis of ileostomy ([place])   Plan: OR today for revision   LOS: 1 day    Rosario Adie, MD [facility], [place]   10/08/2015 8:38 AM

## 2015-10-08 NOTE — Anesthesia Preprocedure Evaluation (Signed)
Anesthesia Evaluation  Patient identified by MRN, date of birth, ID band Patient awake    Reviewed: Allergy & Precautions, NPO status , Patient's Chart, lab work & pertinent test results  Airway Mallampati: II  TM Distance: >3 FB Neck ROM: Full    Dental no notable dental hx. (+) Dental Advisory Given, Teeth Intact   Pulmonary shortness of breath and with exertion,    Pulmonary exam normal breath sounds clear to auscultation       Cardiovascular hypertension, Pt. on medications Normal cardiovascular exam Rhythm:Regular Rate:Normal     Neuro/Psych Anxiety negative neurological ROS     GI/Hepatic Neg liver ROS, PUD,   Endo/Other  diabetes, Well Controlled, Type 2Diet controlled DM  Renal/GU negative Renal ROS  negative genitourinary   Musculoskeletal negative musculoskeletal ROS (+)   Abdominal   Peds negative pediatric ROS (+)  Hematology  (+) anemia , Pernicious anemia. Hgb 7.5   Anesthesia Other Findings   Reproductive/Obstetrics negative OB ROS                             Anesthesia Physical Anesthesia Plan  ASA: III  Anesthesia Plan: General   Post-op Pain Management:    Induction: Intravenous  Airway Management Planned: Oral ETT  Additional Equipment:   Intra-op Plan:   Post-operative Plan: Extubation in OR  Informed Consent:   Plan Discussed with: Surgeon  Anesthesia Plan Comments:         Anesthesia Quick Evaluation

## 2015-10-08 NOTE — Anesthesia Procedure Notes (Signed)
Procedure Name: Intubation Date/Time: 10/08/2015 1:47 PM Performed by: Deliah Boston Pre-anesthesia Checklist: Patient identified, Emergency Drugs available, Suction available and Patient being monitored Patient Re-evaluated:Patient Re-evaluated prior to inductionOxygen Delivery Method: Circle System Utilized Preoxygenation: Pre-oxygenation with 100% oxygen Intubation Type: IV induction Ventilation: Mask ventilation without difficulty Laryngoscope Size: Mac and 3 Grade View: Grade II Tube type: Oral Number of attempts: 1 Airway Equipment and Method: Stylet and Oral airway Placement Confirmation: ETT inserted through vocal cords under direct vision,  positive ETCO2 and breath sounds checked- equal and bilateral Secured at: 20 cm Tube secured with: Tape Dental Injury: Teeth and Oropharynx as per pre-operative assessment

## 2015-10-08 NOTE — Transfer of Care (Signed)
Immediate Anesthesia Transfer of Care Note  Patient: Latasha Wood  Procedure(s) Performed: Procedure(s): ILEOSTOMY REVISION (N/A)  Patient Location: PACU  Anesthesia Type:General  Level of Consciousness: awake, alert , oriented and patient cooperative  Airway & Oxygen Therapy: Patient Spontanous Breathing and Patient connected to face mask oxygen  Post-op Assessment: Report given to RN and Post -op Vital signs reviewed and stable  Post vital signs: Reviewed and stable  Last Vitals:  Filed Vitals:   10/07/15 2216 10/08/15 0600  BP: 110/70 121/68  Pulse: 96 77  Temp: 37.2 C 37.3 C  Resp: 15 16    Complications: No apparent anesthesia complications

## 2015-10-08 NOTE — Op Note (Signed)
10/07/2015 - 10/08/2015  4:20 PM  PATIENT:  Latasha Wood  59 y.o. female  Patient Care Team: Janith Lima, MD as PCP - General (Internal Medicine) Lafayette Dragon, MD (Gastroenterology) Brien Few, MD (Obstetrics and Gynecology) Kem Parkinson, MD (Ophthalmology)  PRE-OPERATIVE DIAGNOSIS:  ileostomy necrosis  POST-OPERATIVE DIAGNOSIS:  ileostomy necrosis   Procedure(s): ILEOSTOMY REVISION, Complex   Surgeon(s): Leighton Ruff, MD Johnathan Hausen, MD Excell Seltzer, MD  ASSISTANT: Dr Hassell Done and Dr Excell Seltzer   ANESTHESIA:   general  EBL:  Total I/O In: 1300 [I.V.:1300] Out: 900 [Urine:300; Blood:600]  DRAINS: none   SPECIMEN:  No Specimen  DISPOSITION OF SPECIMEN:  N/A  COUNTS:  YES   PLAN OF CARE: Admit to inpatient   PATIENT DISPOSITION:  PACU - hemodynamically stable.  INDICATION: 59 year old female with ulcerative colitis on chronic steroids who is approximately 3-4 weeks status post total proctocolectomy with end ileostomy. Over the last week she has developed ileostomy necrosis and worsening abdominal pain. I recommended that she undergo a revision in the operating room.   OR FINDINGS: Significantly adherent abdomen with multiple adhesions. Ileostomy necrotic down to the level of the fascia.  DESCRIPTION: the patient was identified in the preoperative holding area and taken to the OR where they were laid supine on the operating room table.  General anesthesia was induced without difficulty. SCDs were also noted to be in place prior to the initiation of anesthesia.  The patient was then prepped and draped in the usual sterile fashion.   A surgical timeout was performed indicating the correct patient, procedure, positioning and need for preoperative antibiotics.   I began by loosely separating the tissue between the necrotic ileostomy and the subcutaneous fat at the ileostomy site. I was able to bluntly dissect down to the level of the fascia. The  small bowel was either dusky or frankly necrotic down to the level of the fascia there was asked her noted at the fascial level. I opened the fascia with Metzenbaum scissors. I attempted to mobilize the small bowel gently out of the abdominal cavity. I broke up some adhesions but I was unable to mobilize the bowel completely. I resected the unhealthy portion of bowel. This only reached to the level of the fascia. I decided to make a lower midline incision to help mobilize the bowel further. I entered the abdomen using Bovie electrocautery and Metzenbaum scissors. I was able to mobilize to loops of small bowel somewhat through the midline wound. I could not make the terminal ileum reach to the previous ileostomy site. The mesentery was significantly retracted adherent to the retroperitoneum. Every time I tried to mobilize a portion of the mesentery it began to bleed and that devitalized another portion of the terminal ileum. I decided to bring the terminal ileum out through the wound. I evaluated the remaining abdomen. Irrigation was performed. I identified a enterotomy at the previously dissected other loop of small bowel. We were unable to mobilize the small bowel on either end to perform a resection. The area around this appeared devitalized. I decided to mobilize the loop as much as I could and we stapled a GIA 75 mm stapler across the previous opening in the bowel wall. I then resected the devitalized tissues and close the common enterotomy site in 2 layers using 3-0 silk sutures. An anti-tension suture was placed as well. This was then placed back into the abdomen with the suture line facing the retroperitoneum. After this was completed I  used a 2-0 silk suture to close down a bleeding vessel in the mesentery. Hemostasis was then good in the abdomen. I brought out the terminal ileum through the middle of the lower abdominal wound. The remaining fascia around it was closed using interrupted #1 PDS sutures. The  ileostomy site fascia was closed using a 0 Vicryl suture.  I decided to leave the skin open. I secured the ileostomy to the dermis and reapproximated the dermis on both ends. The remaining areas were packed with wound VAC sponge. A wound VAC was placed over this. The ostomy appliance was placed over the ostomy and wound VAC. The patient was then awakened from anesthesia and sent to the postanesthesia care unit in stable condition. All counts were correct per operating room staff.

## 2015-10-09 ENCOUNTER — Encounter (HOSPITAL_COMMUNITY): Payer: Self-pay | Admitting: General Surgery

## 2015-10-09 LAB — COMPREHENSIVE METABOLIC PANEL
ALBUMIN: 2.4 g/dL — AB (ref 3.5–5.0)
ALT: 17 U/L (ref 14–54)
AST: 19 U/L (ref 15–41)
Alkaline Phosphatase: 52 U/L (ref 38–126)
Anion gap: 8 (ref 5–15)
BUN: 13 mg/dL (ref 6–20)
CHLORIDE: 99 mmol/L — AB (ref 101–111)
CO2: 27 mmol/L (ref 22–32)
CREATININE: 1.09 mg/dL — AB (ref 0.44–1.00)
Calcium: 8.4 mg/dL — ABNORMAL LOW (ref 8.9–10.3)
GFR calc non Af Amer: 54 mL/min — ABNORMAL LOW (ref >60)
GLUCOSE: 189 mg/dL — AB (ref 65–99)
Potassium: 4.6 mmol/L (ref 3.5–5.1)
SODIUM: 134 mmol/L — AB (ref 135–145)
Total Bilirubin: 0.6 mg/dL (ref 0.3–1.2)
Total Protein: 5.8 g/dL — ABNORMAL LOW (ref 6.5–8.1)

## 2015-10-09 LAB — DIFFERENTIAL
Basophils Absolute: 0 10*3/uL (ref 0.0–0.1)
Basophils Relative: 0 %
Eosinophils Absolute: 0 10*3/uL (ref 0.0–0.7)
Eosinophils Relative: 0 %
LYMPHS PCT: 5 %
Lymphs Abs: 1.3 10*3/uL (ref 0.7–4.0)
MONO ABS: 0.9 10*3/uL (ref 0.1–1.0)
Monocytes Relative: 4 %
NEUTROS ABS: 22.1 10*3/uL — AB (ref 1.7–7.7)
NEUTROS PCT: 91 %

## 2015-10-09 LAB — CBC
HCT: 22.3 % — ABNORMAL LOW (ref 36.0–46.0)
Hemoglobin: 7.5 g/dL — ABNORMAL LOW (ref 12.0–15.0)
MCH: 29.4 pg (ref 26.0–34.0)
MCHC: 33.6 g/dL (ref 30.0–36.0)
MCV: 87.5 fL (ref 78.0–100.0)
PLATELETS: 687 10*3/uL — AB (ref 150–400)
RBC: 2.55 MIL/uL — AB (ref 3.87–5.11)
RDW: 14 % (ref 11.5–15.5)
WBC: 24.3 10*3/uL — AB (ref 4.0–10.5)

## 2015-10-09 LAB — TRIGLYCERIDES
Triglycerides: 82 mg/dL (ref <150)
Triglycerides: 82 mg/dL (ref <150)

## 2015-10-09 LAB — GLUCOSE, CAPILLARY
GLUCOSE-CAPILLARY: 136 mg/dL — AB (ref 65–99)
Glucose-Capillary: 122 mg/dL — ABNORMAL HIGH (ref 65–99)

## 2015-10-09 LAB — PREALBUMIN
PREALBUMIN: 16.6 mg/dL — AB (ref 18–38)
Prealbumin: 15.9 mg/dL — ABNORMAL LOW (ref 18–38)

## 2015-10-09 LAB — PHOSPHORUS: Phosphorus: 3.2 mg/dL (ref 2.5–4.6)

## 2015-10-09 LAB — MAGNESIUM: MAGNESIUM: 1.8 mg/dL (ref 1.7–2.4)

## 2015-10-09 MED ORDER — INSULIN ASPART 100 UNIT/ML ~~LOC~~ SOLN
0.0000 [IU] | Freq: Four times a day (QID) | SUBCUTANEOUS | Status: DC
  Administered 2015-10-09 – 2015-10-18 (×15): 2 [IU] via SUBCUTANEOUS

## 2015-10-09 MED ORDER — LORAZEPAM 2 MG/ML IJ SOLN
0.5000 mg | INTRAMUSCULAR | Status: DC | PRN
  Administered 2015-10-09 – 2015-10-20 (×22): 0.5 mg via INTRAVENOUS
  Filled 2015-10-09 (×23): qty 1

## 2015-10-09 MED ORDER — SODIUM CHLORIDE 0.9 % IJ SOLN
10.0000 mL | Freq: Two times a day (BID) | INTRAMUSCULAR | Status: DC
  Administered 2015-10-09 – 2015-10-22 (×6): 10 mL

## 2015-10-09 MED ORDER — TRACE MINERALS CR-CU-MN-SE-ZN 10-1000-500-60 MCG/ML IV SOLN
INTRAVENOUS | Status: AC
  Administered 2015-10-09: 17:00:00 via INTRAVENOUS
  Filled 2015-10-09: qty 960

## 2015-10-09 MED ORDER — ACETAMINOPHEN 325 MG PO TABS: 325.0000 mg | ORAL_TABLET | Freq: Four times a day (QID) | ORAL | Status: DC | PRN

## 2015-10-09 MED ORDER — SODIUM CHLORIDE 0.9 % IJ SOLN
10.0000 mL | INTRAMUSCULAR | Status: DC | PRN
  Administered 2015-10-10 – 2015-10-12 (×3): 10 mL
  Administered 2015-10-15: 30 mL
  Administered 2015-10-16 – 2015-10-20 (×2): 10 mL
  Administered 2015-10-23: 20 mL
  Administered 2015-10-23 (×2): 10 mL
  Administered 2015-10-24: 30 mL
  Filled 2015-10-09 (×10): qty 40

## 2015-10-09 MED ORDER — KETOROLAC TROMETHAMINE 15 MG/ML IJ SOLN
15.0000 mg | Freq: Four times a day (QID) | INTRAMUSCULAR | Status: AC
  Administered 2015-10-09 – 2015-10-12 (×12): 15 mg via INTRAVENOUS
  Filled 2015-10-09 (×17): qty 1

## 2015-10-09 MED ORDER — FAT EMULSION 20 % IV EMUL
240.0000 mL | INTRAVENOUS | Status: AC
  Administered 2015-10-09: 240 mL via INTRAVENOUS
  Filled 2015-10-09: qty 240

## 2015-10-09 MED ORDER — KCL IN DEXTROSE-NACL 20-5-0.45 MEQ/L-%-% IV SOLN
INTRAVENOUS | Status: DC
  Administered 2015-10-09 – 2015-10-10 (×3): via INTRAVENOUS
  Filled 2015-10-09: qty 1000

## 2015-10-09 NOTE — Progress Notes (Signed)
Necrosis of ileostomy ([place])  Postop day 1 from ileostomy revision and repair of small bowel enterotomy  Subjective: Pt had an episode of anxiety overnight and pulled out her NG tube. She is currently having trouble with keeping her pain under control with the current regimen.  Objective: Vital signs in last 24 hours: Temp:  [98.8 F (37.1 C)-100.8 F (38.2 C)] 100 F (37.8 C) (11/30 0556) Pulse Rate:  [67-162] 100 (11/30 0556) Resp:  [10-21] 18 (11/30 0556) BP: (140-189)/(76-121) 140/76 mmHg (11/30 0556) SpO2:  [96 %-100 %] 98 % (11/30 0556) Last BM Date: 10/07/15  Intake/Output from previous day: 11/29 0701 - 11/30 0700 In: 2450 [I.V.:2450] Out: 2710 [Urine:1960; Drains:150; Blood:600] Intake/Output this shift:    General appearance: alert and cooperative GI: soft, no peritoneal signs Incision/Wound: wound VAC in place Ostomy beefy red  Lab Results:  Results for orders placed or performed during the hospital encounter of 10/07/15 (from the past 24 hour(s))  Glucose, capillary     Status: Abnormal   Collection Time: 10/08/15  4:54 PM  Result Value Ref Range   Glucose-Capillary 142 (H) 65 - 99 mg/dL   Comment 1 Notify RN    Comment 2 Document in Chart   Comprehensive metabolic panel     Status: Abnormal   Collection Time: 10/09/15  6:25 AM  Result Value Ref Range   Sodium 134 (L) 135 - 145 mmol/L   Potassium 4.6 3.5 - 5.1 mmol/L   Chloride 99 (L) 101 - 111 mmol/L   CO2 27 22 - 32 mmol/L   Glucose, Bld 189 (H) 65 - 99 mg/dL   BUN 13 6 - 20 mg/dL   Creatinine, Ser 1.09 (H) 0.44 - 1.00 mg/dL   Calcium 8.4 (L) 8.9 - 10.3 mg/dL   Total Protein 5.8 (L) 6.5 - 8.1 g/dL   Albumin 2.4 (L) 3.5 - 5.0 g/dL   AST 19 15 - 41 U/L   ALT 17 14 - 54 U/L   Alkaline Phosphatase 52 38 - 126 U/L   Total Bilirubin 0.6 0.3 - 1.2 mg/dL   GFR calc non Af Amer 54 (L) >60 mL/min   GFR calc Af Amer >60 >60 mL/min   Anion gap 8 5 - 15  Prealbumin     Status: Abnormal   Collection Time:  10/09/15  6:25 AM  Result Value Ref Range   Prealbumin 15.9 (L) 18 - 38 mg/dL  Magnesium     Status: None   Collection Time: 10/09/15  6:25 AM  Result Value Ref Range   Magnesium 1.8 1.7 - 2.4 mg/dL  Phosphorus     Status: None   Collection Time: 10/09/15  6:25 AM  Result Value Ref Range   Phosphorus 3.2 2.5 - 4.6 mg/dL  Triglycerides     Status: None   Collection Time: 10/09/15  6:25 AM  Result Value Ref Range   Triglycerides 82 <150 mg/dL  CBC     Status: Abnormal   Collection Time: 10/09/15  6:25 AM  Result Value Ref Range   WBC 24.3 (H) 4.0 - 10.5 K/uL   RBC 2.55 (L) 3.87 - 5.11 MIL/uL   Hemoglobin 7.5 (L) 12.0 - 15.0 g/dL   HCT 22.3 (L) 36.0 - 46.0 %   MCV 87.5 78.0 - 100.0 fL   MCH 29.4 26.0 - 34.0 pg   MCHC 33.6 30.0 - 36.0 g/dL   RDW 14.0 11.5 - 15.5 %   Platelets 687 (H) 150 - 400 K/uL  Differential     Status: Abnormal   Collection Time: 10/09/15  6:25 AM  Result Value Ref Range   Neutrophils Relative % 91 %   Neutro Abs 22.1 (H) 1.7 - 7.7 K/uL   Lymphocytes Relative 5 %   Lymphs Abs 1.3 0.7 - 4.0 K/uL   Monocytes Relative 4 %   Monocytes Absolute 0.9 0.1 - 1.0 K/uL   Eosinophils Relative 0 %   Eosinophils Absolute 0.0 0.0 - 0.7 K/uL   Basophils Relative 0 %   Basophils Absolute 0.0 0.0 - 0.1 K/uL  Prealbumin     Status: Abnormal   Collection Time: 10/09/15  6:25 AM  Result Value Ref Range   Prealbumin 16.6 (L) 18 - 38 mg/dL  Triglycerides     Status: None   Collection Time: 10/09/15  6:25 AM  Result Value Ref Range   Triglycerides 82 <150 mg/dL     Studies/Results Radiology     MEDS, Scheduled . enoxaparin (LOVENOX) injection  40 mg Subcutaneous Q24H  . ketorolac  15 mg Intravenous 4 times per day  . predniSONE  5 mg Oral Q breakfast     Assessment: Necrosis of ileostomy ([place])   Plan: We will continue nothing by mouth and TPN. Due to her protein calorie malnutrition from recent surgery and chronic illness I would like her to start on TPN  until her bowel function resolves. I think that it could take up to a week for her bowel function to return. During that time she will be nothing by mouth.  Given this malnutrition and her steroid use, she is at high risk for postoperative complications due to healing difficulties. We discussed whether to replace the NG tube. We will reassess daily. If she becomes distended or begins to vomit, we will replace the tube. I will use Toradol to help with additional pain control. We will switch her anxiety medication to IV.  LOS: 2 days    Rosario Adie, MD [facility], [place]   10/09/2015 9:32 AM

## 2015-10-09 NOTE — Progress Notes (Addendum)
Husband Called out for help, Wife saying she cant breath. Pt in a very anxious state and pulled NGT out. Call out to Rapid Response Nurse. Vital signs taken and assisted pt to calm down. VSS stable, noted elevated temp at 100.8, respirations at 24, pulse at 150 pox sat remained between 96-100% on Room air.  Cheryl Rapid Nurse at bedside to assess pt.  Doctor Rockne Coons with a call back at 01:02 am. Made aware of the situation and vital signs of patient. Ok to leave NGT out per Dr Hassell Done. No new med orders, Vital signs: Temp 100.4, B/P 149/90, Pulse 134, resp 18 98% on Room air.  Will continue to monitor per M.D. Orders and unit protocol. Call light and phone with in reach and husband at the bedside.

## 2015-10-09 NOTE — Progress Notes (Signed)
Peripherally Inserted Central Catheter/Midline Placement  The IV Nurse has discussed with the patient and/or persons authorized to consent for the patient, the purpose of this procedure and the potential benefits and risks involved with this procedure.  The benefits include less needle sticks, lab draws from the catheter and patient may be discharged home with the catheter.  Risks include, but not limited to, infection, bleeding, blood clot (thrombus formation), and puncture of an artery; nerve damage and irregular heat beat.  Alternatives to this procedure were also discussed.  PICC/Midline Placement Documentation  PICC Double Lumen 17/49/44 PICC Right Basilic 37 cm 0 cm (Active)  Indication for Insertion or Continuance of Line Administration of hyperosmolar/irritating solutions (i.e. TPN, Vancomycin, etc.) 10/09/2015  3:17 PM  Exposed Catheter (cm) 0 cm 10/09/2015  3:17 PM  Site Assessment Clean;Dry;Intact 10/09/2015  3:17 PM  Lumen #1 Status Flushed;Saline locked;Blood return noted 10/09/2015  3:17 PM  Lumen #2 Status Flushed;Saline locked;Blood return noted 10/09/2015  3:17 PM  Dressing Type Transparent 10/09/2015  3:17 PM  Dressing Change Due 10/16/15 10/09/2015  3:17 PM       Gordan Payment 10/09/2015, 3:19 PM

## 2015-10-09 NOTE — Consult Note (Signed)
WOC ostomy follow up Discussed surgery with Dr. Marcello Moores and we will plan to change NPWT and perform initial pouch together tomorrow.  Supplies in room.  Patient visit today for emotional support and to answer questions about NPWT. Patient is OOB in chair and in process of determining how best to stay on top of pain medications.  [place] nursing team will remain available to this patient, the nursing, surgical and medical teams.   Thanks, Maudie Flakes, MSN, RN, [place], Arther Abbott  Pager# [phone]

## 2015-10-09 NOTE — Significant Event (Signed)
Rapid Response Event Note  Overview: Time Called: 0050 Arrival Time: 0055 Event Type: Unknown  Initial Focused Assessment:Called to see patient due to SOB and anxiety. Upon entering her room she stated "I couldn't take that tube (NG) anymore. Pt has bowel sounds and ostomy site is pink. No flatus in ostomy bag.    Interventions:MD notified, NG left out, v/s stable and heart rate to be watched.   Event Summary: Name of Physician Notified: DR. Hassell Done at 0100    at    Outcome: Stayed in room and stabalized     Pricilla Riffle

## 2015-10-09 NOTE — Care Management Note (Signed)
Case Management Note  Patient Details  Name: Latasha Wood MRN: [id number] Date of Birth: 10-01-1956  Subjective/Objective:     S/p ileostomy revision r/t necrosis                Action/Plan: Discharge planning, previously active with [facility] for RN  Expected Discharge Date:                  Expected Discharge Plan:  [place]  In-House Referral:  NA  Discharge planning Services  CM Consult  Post Acute Care Choice:  Home Health Choice offered to:  Patient  DME Arranged:    DME Agency:     HH Arranged:  RN, Disease Management [place] Agency:  [place]  Status of Service:  In process, will continue to follow  Medicare Important Message Given:    Date Medicare IM Given:    Medicare IM give by:    Date Additional Medicare IM Given:    Additional Medicare Important Message give by:     If discussed at [place] of Stay Meetings, dates discussed:    Additional Comments:  Guadalupe Maple, RN 10/09/2015, 8:39 AM

## 2015-10-09 NOTE — Progress Notes (Signed)
Initial Nutrition Assessment  DOCUMENTATION CODES:   Obesity unspecified  INTERVENTION:   TPN per Pharmacy RD to continue to monitor  NUTRITION DIAGNOSIS:   Inadequate oral intake related to inability to eat, altered GI function as evidenced by NPO status  GOAL:   Patient will meet greater than or equal to 90% of their needs  MONITOR:   Labs, Weight trends, Skin, I & O's, Other (Comment) (TPN)  REASON FOR ASSESSMENT:   Consult New TPN/TNA  ASSESSMENT:   59 year old female presenting for a post-operative visit. 59 year old female status post total proctocolectomy with small bowel resection who presents to the office with worsening pain around her ostomy site.  11/29: s/p Procedure(s): ILEOSTOMY REVISION, Complex  Patient reports eating well with no appetite changes prior to surgery. Pt reports losing weight since surgery but weight history shows that her weight has remained stable. Pt pulled out her NGT last night d/t anxiety. Pt to begin TPN today per surgery.  Pharmacy note pending.  Nutrition focused physical exam shows no sign of depletion of muscle mass or body fat.  Labs reviewed: CBGs: 90-142 Low Na Elevated Creatinine Mg/Phos WNL  Diet Order:  Diet NPO time specified Except for: Ice Chips, Sips with Meds  Skin:  Wound (see comment) (abdominal incision)  Last BM:  11/28  Height:   Ht Readings from Last 1 Encounters:  10/07/15 5' 3"  (1.6 m)    Weight:   Wt Readings from Last 1 Encounters:  10/07/15 196 lb 3.4 oz (89 kg)    Ideal Body Weight:  52.3 kg  BMI:  Body mass index is 34.77 kg/(m^2).  Estimated Nutritional Needs:   Kcal:  1550-1750  Protein:  75-85g  Fluid:  1.7L/day  EDUCATION NEEDS:   No education needs identified at this time  Clayton Bibles, MS, RD, LDN Pager: [phone] After Hours Pager: [phone]

## 2015-10-09 NOTE — Progress Notes (Signed)
PARENTERAL NUTRITION CONSULT NOTE - INITIAL  Pharmacy Consult for TPN Indication: protein calorie malnutrition  No Known Allergies  Patient Measurements: Height: 5' 3"  (160 cm) Weight: 196 lb 3.4 oz (89 kg) IBW/kg (Calculated) : 52.4  Vital Signs: Temp: 98.3 F (36.8 C) (11/30 1400) Temp Source: Oral (11/30 1400) BP: 131/73 mmHg (11/30 1400) Pulse Rate: 94 (11/30 1400) Intake/Output from previous day: 11/29 0701 - 11/30 0700 In: 2450 [I.V.:2450] Out: 2710 [Urine:1960; Drains:150; Blood:600] Intake/Output from this shift:    Labs:  Recent Labs  10/07/15 1334 10/08/15 0542 10/09/15 0625  WBC 17.9* 12.4* 24.3*  HGB 8.3* 7.5* 7.5*  HCT 25.9* 23.1* 22.3*  PLT 687* 613* 687*     Recent Labs  10/07/15 1334 10/08/15 0542 10/09/15 0625  NA 135 137 134*  K 3.8 4.1 4.6  CL 98* 101 99*  CO2 28 30 27   GLUCOSE 122* 89 189*  BUN 27* 14 13  CREATININE 1.09* 0.90 1.09*  CALCIUM 9.3 8.8* 8.4*  MG  --   --  1.8  PHOS  --   --  3.2  PROT  --   --  5.8*  ALBUMIN  --   --  2.4*  AST  --   --  19  ALT  --   --  17  ALKPHOS  --   --  52  BILITOT  --   --  0.6  PREALBUMIN  --   --  16.6*  15.9*  TRIG  --   --  82  82   Estimated Creatinine Clearance: 58.1 mL/min (by C-G formula based on Cr of 1.09).    Recent Labs  10/08/15 1654  GLUCAP 142*    Medical History: Past Medical History  Diagnosis Date  . Hypertension   . Colitis, ulcerative ([place])   . Hyperlipidemia   . Colon polyps   . Blood transfusion   . Anemia   . Shortness of breath dyspnea     talking or walking   . Type 2 diabetes mellitus with complication, without long-term current use of insulin ([place]) 09/05/2015    currently on no medications   . Numbness and tingling     hands and feet bilat    Insulin Requirements:  None  Current Nutrition: NPO  IVF: D51/2NS with 20 mEq KCl/L at 75 ml/hr  Central access: PICC line to be placed today, 10/09/15 TPN start date: 10/09/15 (after PICC  placement)  ASSESSMENT                                                                                                          HPI:  62 y/oF approximately 2 weeks s/p total abdominal proctectomy who presented to MD office 11/28 with frank necrosis of her ileostomy and was subsequently admitted to [facility]. Patient underwent ileostomy revision and repair of small bowel enterotomy on 11/29. Pharmacy asked to start TPN for this patient due to protein calorie malnutrition from recent surgery and chronic illness until her bowel function resolves.   Significant events:  11/30: Patient pulled  NG tube out overnight  Today:    Glucose - Glucose 189 this AM on BMET. History of type 2 DM, currently not on any medications (last Hemoglobin A1c 6.6%)  Electrolytes - K, Mag, Phos, Corrected Ca WNL, Na slightly low  Renal - SCr slightly elevated at 1.09, CrCl ~ 58 ml/min  LFTs - AST/ALT, Alk Phos, Tbili WNL  TGs - 82  Prealbumin - 15.9, 16.6 (both drawn this AM)  NUTRITIONAL GOALS                                                                                             RD recs: Kcal: 1550-1750, Protein: 75-85g  Clinimix 5/15 at a goal rate of 70 ml/hr + 20% fat emulsion at 10 ml/hr to provide: 84 g/day protein, 1672 Kcal/day.  PLAN                                                                                                                         At 1800 today:  Start Clinimix E 5/15 at 40 ml/hr.  20% fat emulsion at 10 ml/hr.  Plan to advance as tolerated to the goal rate.  TPN to contain standard multivitamins and trace elements.  Reduce IVF to 40 ml/hr.  Add moderate SSI with q6h CBG checks.   TPN lab panels on Mondays & Thursdays.  F/u daily.   Lindell Spar, PharmD, BCPS Pager: [phone] 10/09/2015 3:00 PM

## 2015-10-10 LAB — COMPREHENSIVE METABOLIC PANEL
ALBUMIN: 2.1 g/dL — AB (ref 3.5–5.0)
ALK PHOS: 58 U/L (ref 38–126)
ALT: 12 U/L — AB (ref 14–54)
AST: 12 U/L — AB (ref 15–41)
Anion gap: 6 (ref 5–15)
BUN: 15 mg/dL (ref 6–20)
CALCIUM: 8.1 mg/dL — AB (ref 8.9–10.3)
CO2: 28 mmol/L (ref 22–32)
CREATININE: 0.98 mg/dL (ref 0.44–1.00)
Chloride: 104 mmol/L (ref 101–111)
GFR calc non Af Amer: >60 mL/min (ref >60)
GLUCOSE: 127 mg/dL — AB (ref 65–99)
Potassium: 4.1 mmol/L (ref 3.5–5.1)
SODIUM: 138 mmol/L (ref 135–145)
Total Bilirubin: 0.3 mg/dL (ref 0.3–1.2)
Total Protein: 5.3 g/dL — ABNORMAL LOW (ref 6.5–8.1)

## 2015-10-10 LAB — GLUCOSE, CAPILLARY
Glucose-Capillary: 108 mg/dL — ABNORMAL HIGH (ref 65–99)
Glucose-Capillary: 119 mg/dL — ABNORMAL HIGH (ref 65–99)
Glucose-Capillary: 124 mg/dL — ABNORMAL HIGH (ref 65–99)
Glucose-Capillary: 91 mg/dL (ref 65–99)

## 2015-10-10 LAB — PHOSPHORUS: Phosphorus: 2.8 mg/dL (ref 2.5–4.6)

## 2015-10-10 LAB — CBC
HEMATOCRIT: 18.7 % — AB (ref 36.0–46.0)
Hemoglobin: 6.2 g/dL — CL (ref 12.0–15.0)
MCH: 29.4 pg (ref 26.0–34.0)
MCHC: 33.2 g/dL (ref 30.0–36.0)
MCV: 88.6 fL (ref 78.0–100.0)
Platelets: 558 10*3/uL — ABNORMAL HIGH (ref 150–400)
RBC: 2.11 MIL/uL — ABNORMAL LOW (ref 3.87–5.11)
RDW: 14.2 % (ref 11.5–15.5)
WBC: 17.1 10*3/uL — ABNORMAL HIGH (ref 4.0–10.5)

## 2015-10-10 LAB — PREPARE RBC (CROSSMATCH)

## 2015-10-10 LAB — MAGNESIUM: Magnesium: 2 mg/dL (ref 1.7–2.4)

## 2015-10-10 MED ORDER — ACETAMINOPHEN 325 MG PO TABS
650.0000 mg | ORAL_TABLET | Freq: Four times a day (QID) | ORAL | Status: AC
  Administered 2015-10-10 – 2015-10-12 (×10): 650 mg via ORAL
  Filled 2015-10-10 (×14): qty 2

## 2015-10-10 MED ORDER — TRACE MINERALS CR-CU-MN-SE-ZN 10-1000-500-60 MCG/ML IV SOLN
INTRAVENOUS | Status: AC
  Administered 2015-10-10: 17:00:00 via INTRAVENOUS
  Filled 2015-10-10: qty 1320

## 2015-10-10 MED ORDER — SODIUM CHLORIDE 0.9 % IV SOLN
Freq: Once | INTRAVENOUS | Status: AC
  Administered 2015-10-10: 13:00:00 via INTRAVENOUS

## 2015-10-10 MED ORDER — FAT EMULSION 20 % IV EMUL
240.0000 mL | INTRAVENOUS | Status: AC
  Administered 2015-10-10: 240 mL via INTRAVENOUS
  Filled 2015-10-10: qty 250

## 2015-10-10 NOTE — Progress Notes (Signed)
Necrosis of ileostomy ([place])  Postop day 2 from complex ileostomy revision and repair of small bowel enterotomy.  Having some low grade fevers  Subjective: Pt had a much better night, pain better with toradol.  Foley out this am  Objective: Vital signs in last 24 hours: Temp:  [98.3 F (36.8 C)-100.7 F (38.2 C)] 100.5 F (38.1 C) (12/01 0627) Pulse Rate:  [87-114] 105 (12/01 0627) Resp:  [14-18] 18 (12/01 0627) BP: (116-150)/(60-74) 120/61 mmHg (12/01 0627) SpO2:  [97 %-100 %] 98 % (12/01 0627) Last BM Date: 10/07/15  Intake/Output from previous day: 11/30 0701 - 12/01 0700 In: 1381.8 [I.V.:1381.8] Out: 2160 [Urine:2050; Drains:110] Intake/Output this shift:    General appearance: alert and cooperative GI: soft, no peritoneal signs Incision/Wound: wound VAC in place   Lab Results:  Results for orders placed or performed during the hospital encounter of 10/07/15 (from the past 24 hour(s))  Glucose, capillary     Status: Abnormal   Collection Time: 10/09/15  5:37 PM  Result Value Ref Range   Glucose-Capillary 136 (H) 65 - 99 mg/dL  Glucose, capillary     Status: Abnormal   Collection Time: 10/09/15 11:55 PM  Result Value Ref Range   Glucose-Capillary 122 (H) 65 - 99 mg/dL  Comprehensive metabolic panel     Status: Abnormal   Collection Time: 10/10/15  5:00 AM  Result Value Ref Range   Sodium 138 135 - 145 mmol/L   Potassium 4.1 3.5 - 5.1 mmol/L   Chloride 104 101 - 111 mmol/L   CO2 28 22 - 32 mmol/L   Glucose, Bld 127 (H) 65 - 99 mg/dL   BUN 15 6 - 20 mg/dL   Creatinine, Ser 0.98 0.44 - 1.00 mg/dL   Calcium 8.1 (L) 8.9 - 10.3 mg/dL   Total Protein 5.3 (L) 6.5 - 8.1 g/dL   Albumin 2.1 (L) 3.5 - 5.0 g/dL   AST 12 (L) 15 - 41 U/L   ALT 12 (L) 14 - 54 U/L   Alkaline Phosphatase 58 38 - 126 U/L   Total Bilirubin 0.3 0.3 - 1.2 mg/dL   GFR calc non Af Amer >60 >60 mL/min   GFR calc Af Amer >60 >60 mL/min   Anion gap 6 5 - 15  Magnesium     Status: None   Collection  Time: 10/10/15  5:00 AM  Result Value Ref Range   Magnesium 2.0 1.7 - 2.4 mg/dL  Phosphorus     Status: None   Collection Time: 10/10/15  5:00 AM  Result Value Ref Range   Phosphorus 2.8 2.5 - 4.6 mg/dL  CBC     Status: Abnormal   Collection Time: 10/10/15  5:00 AM  Result Value Ref Range   WBC 17.1 (H) 4.0 - 10.5 K/uL   RBC 2.11 (L) 3.87 - 5.11 MIL/uL   Hemoglobin 6.2 (LL) 12.0 - 15.0 g/dL   HCT 18.7 (L) 36.0 - 46.0 %   MCV 88.6 78.0 - 100.0 fL   MCH 29.4 26.0 - 34.0 pg   MCHC 33.2 30.0 - 36.0 g/dL   RDW 14.2 11.5 - 15.5 %   Platelets 558 (H) 150 - 400 K/uL  Glucose, capillary     Status: None   Collection Time: 10/10/15  6:45 AM  Result Value Ref Range   Glucose-Capillary 91 65 - 99 mg/dL     Studies/Results Radiology     MEDS, Scheduled . sodium chloride   Intravenous Once  . enoxaparin (LOVENOX)  injection  40 mg Subcutaneous Q24H  . insulin aspart  0-15 Units Subcutaneous 4 times per day  . ketorolac  15 mg Intravenous 4 times per day  . predniSONE  5 mg Oral Q breakfast  . sodium chloride  10-40 mL Intracatheter Q12H     Assessment: Necrosis of ileostomy ([place])   Plan: We will continue nothing by mouth and TPN. Due to her protein calorie malnutrition from recent surgery and chronic illness I have started TPN and will cont until her bowel function resolves.  NPO until good bowel function.  Given this malnutrition and her steroid use, she is at high risk for postoperative complications due to healing difficulties. We discussed whether to replace the NG tube. We will reassess daily. If she becomes distended or begins to vomit, we will replace the tube. I will use Toradol to help with additional pain control. We will switch her anxiety medication to IV.  Cont scheduled tylenol for low grade fevers.  LOS: 3 days    Rosario Adie, [facility], [place]   10/10/2015 7:35 AM

## 2015-10-10 NOTE — Progress Notes (Signed)
Nutrition Follow-up  DOCUMENTATION CODES:   Obesity unspecified  INTERVENTION:   TPN per Pharmacy RD to continue to monitor  NUTRITION DIAGNOSIS:   Inadequate oral intake related to inability to eat, altered GI function as evidenced by NPO status.  Ongoing.  GOAL:   Patient will meet greater than or equal to 90% of their needs  Progressing.  MONITOR:   Labs, Weight trends, Skin, I & O's, Other (Comment) (TPN)  ASSESSMENT:   59 year old female presenting for a post-operative visit. 59 year old female status post total proctocolectomy with small bowel resection who presents to the office with worsening pain around her ostomy site.  11/29: s/p Procedure(s): ILEOSTOMY REVISION, Complex  Patient continues on TPN, tolerating.  Plan per Pharmacy: At 1800 today:  Increase Clinimix E 5/15 to 55 ml/hr.  20% fat emulsion at 10 ml/hr.  Plan to advance as tolerated to the goal rate.  Labs reviewed: CBGs: 91-122 Mg/Phos WNL  Diet Order:  Diet NPO time specified Except for: Ice Chips, Sips with Meds TPN (CLINIMIX-E) Adult .TPN (CLINIMIX-E) Adult  Skin:  Wound (see comment) (abdominal incision)  Last BM:  12/1  Height:   Ht Readings from Last 1 Encounters:  10/07/15 5' 3"  (1.6 m)    Weight:   Wt Readings from Last 1 Encounters:  10/07/15 196 lb 3.4 oz (89 kg)    Ideal Body Weight:  52.3 kg  BMI:  Body mass index is 34.77 kg/(m^2).  Estimated Nutritional Needs:   Kcal:  1550-1750  Protein:  75-85g  Fluid:  1.7L/day  EDUCATION NEEDS:   No education needs identified at this time  Clayton Bibles, MS, RD, LDN Pager: [phone] After Hours Pager: [phone]

## 2015-10-10 NOTE — Consult Note (Addendum)
WOC ostomy consult note:   Seen today with Dr. Marcello Moores at bedside.  Stoma type/location: midline, separates two full thickness wounds Stomal assessment/size: 1 and 1/8 inch slightly oval, functioning, dusky at apex. Peristomal assessment: intact Treatment options for stomal/peristomal skin: None Output: thick brown effluent Ostomy pouching: 1pc.convex pouching system Kellie Simmering # [phone] Education provided: None today Enrolled patient in [place] program: Previously.  Will need to request new supply when we determine which system is best.  WOC wound consult note Reason for Consult: Surgical wound, complex. Wound type:Surgical Pressure Ulcer POA: No Measurement: Proximal wound measures 6cm x 2cm x 3.5cm and distal wound measures 4cm x 2cm x 3cm.  Old ileostomy site measures 1cm x 1.5cm x 3cm.  All wound beds are red, moist and with serous exudate. Stoma (ileostomy) separates the two wounds. Wound bed:As above. Drainage (amount, consistency, odor) Serosanguinous in cannister, serous at assessment Periwound: intact, dry. Dressing procedure/placement/frequency: NPWT placed to two full thickness wounds proximal and distal to ostomy and including old ostomy site in the RUQ using bridge technique. Two attempts required as first attempt did not yield an effective seal. Dressing and pouch change frequency will be three times weekly.  Dr. Marcello Moores is fine with [place] planning for the next dressing change to be on Monday, 10/14/15. [facility] will perform that change until process is refined.  Supplies for an unplanned change are in the room. Supplies used today:  2 pieces of white foam, 5 pieces of black foam.  [place] nursing team will remain available to this patient, the nursing, surgical and medical teams.   Thanks, Maudie Flakes, MSN, RN, [place], Arther Abbott  Pager# [phone]

## 2015-10-10 NOTE — Progress Notes (Addendum)
PARENTERAL NUTRITION CONSULT NOTE - FOLLOW-UP  Pharmacy Consult for TPN Indication: protein calorie malnutrition  No Known Allergies  Patient Measurements: Height: 5' 3"  (160 cm) Weight: 196 lb 3.4 oz (89 kg) IBW/kg (Calculated) : 52.4  Vital Signs: Temp: 100.5 F (38.1 C) (12/01 0627) Temp Source: Oral (12/01 0627) BP: 120/61 mmHg (12/01 0627) Pulse Rate: 105 (12/01 0627) Intake/Output from previous day: 11/30 0701 - 12/01 0700 In: 1381.8 [I.V.:1381.8] Out: 2160 [Urine:2050; Drains:110] Intake/Output from this shift:    Labs:  Recent Labs  10/08/15 0542 10/09/15 0625 10/10/15 0500  WBC 12.4* 24.3* 17.1*  HGB 7.5* 7.5* 6.2*  HCT 23.1* 22.3* 18.7*  PLT 613* 687* 558*     Recent Labs  10/08/15 0542 10/09/15 0625 10/10/15 0500  NA 137 134* 138  K 4.1 4.6 4.1  CL 101 99* 104  CO2 30 27 28   GLUCOSE 89 189* 127*  BUN 14 13 15   CREATININE 0.90 1.09* 0.98  CALCIUM 8.8* 8.4* 8.1*  MG  --  1.8 2.0  PHOS  --  3.2 2.8  PROT  --  5.8* 5.3*  ALBUMIN  --  2.4* 2.1*  AST  --  19 12*  ALT  --  17 12*  ALKPHOS  --  52 58  BILITOT  --  0.6 0.3  PREALBUMIN  --  16.6*  15.9*  --   TRIG  --  82  82  --    Estimated Creatinine Clearance: 64.6 mL/min (by C-G formula based on Cr of 0.98).    Recent Labs  10/09/15 1737 10/09/15 2355 10/10/15 0645  GLUCAP 136* 122* 91    Medical History: Past Medical History  Diagnosis Date  . Hypertension   . Colitis, ulcerative ([place])   . Hyperlipidemia   . Colon polyps   . Blood transfusion   . Anemia   . Shortness of breath dyspnea     talking or walking   . Type 2 diabetes mellitus with complication, without long-term current use of insulin ([place]) 09/05/2015    currently on no medications   . Numbness and tingling     hands and feet bilat    Insulin Requirements:  4 units Novolog SSI since TPN initiation   Current Nutrition: NPO  IVF: none, d/c'ed by MD this AM  Central access: PICC line placed 10/09/15 TPN  start date: 10/09/15   ASSESSMENT                                                                                                          HPI:  22 y/oF approximately 2 weeks s/p total abdominal proctectomy who presented to MD office 11/28 with frank necrosis of her ileostomy and was subsequently admitted to [facility]. Patient underwent ileostomy revision and repair of small bowel enterotomy on 11/29. Pharmacy asked to start TPN for this patient due to protein calorie malnutrition from recent surgery and chronic illness until her bowel function resolves.   Significant events:  11/30: Patient pulled NG tube out overnight 12/1: Having low grade fevers  Today:  Glucose - CBGs at goal (< 150). History of type 2 DM, currently not on any medications (last Hemoglobin A1c 6.6%)  Electrolytes - all WNL  Renal - SCr improved to WNL, CrCl ~ 65 ml/min  LFTs - AST/ALT low, Alk Phos, Tbili WNL  TGs - 82 (11/30)  Prealbumin - 15.9, 16.6 (11/30)  NUTRITIONAL GOALS                                                                                             RD recs: Kcal: 1550-1750, Protein: 75-85g  Clinimix 5/15 at a goal rate of 70 ml/hr + 20% fat emulsion at 10 ml/hr to provide: 84 g/day protein, 1672 Kcal/day.  PLAN                                                                                                                         At 1800 today:  Increase Clinimix E 5/15 to 55 ml/hr.  20% fat emulsion at 10 ml/hr.  Plan to advance as tolerated to the goal rate.  TPN to contain standard multivitamins and trace elements.  Continue moderate SSI with q6h CBG checks.   TPN lab panels on Mondays & Thursdays.  BMET, Mag, Phos in AM.  F/u daily.   Lindell Spar, PharmD, BCPS Pager: [phone] 10/10/2015 9:45 AM

## 2015-10-10 NOTE — Progress Notes (Signed)
Foley removed, pt due to void, Lab called @6 :51am with critical Hemoglobin at 6.2 Will f/u with [facility] RN

## 2015-10-11 LAB — CBC
HCT: 25 % — ABNORMAL LOW (ref 36.0–46.0)
HEMOGLOBIN: 8.4 g/dL — AB (ref 12.0–15.0)
MCH: 28.5 pg (ref 26.0–34.0)
MCHC: 33.6 g/dL (ref 30.0–36.0)
MCV: 84.7 fL (ref 78.0–100.0)
Platelets: 569 10*3/uL — ABNORMAL HIGH (ref 150–400)
RBC: 2.95 MIL/uL — ABNORMAL LOW (ref 3.87–5.11)
RDW: 13.9 % (ref 11.5–15.5)
WBC: 15.5 10*3/uL — ABNORMAL HIGH (ref 4.0–10.5)

## 2015-10-11 LAB — MAGNESIUM: MAGNESIUM: 2 mg/dL (ref 1.7–2.4)

## 2015-10-11 LAB — TYPE AND SCREEN
ABO/RH(D): B POS
Antibody Screen: NEGATIVE
UNIT DIVISION: 0
Unit division: 0

## 2015-10-11 LAB — BASIC METABOLIC PANEL
Anion gap: 7 (ref 5–15)
BUN: 17 mg/dL (ref 6–20)
CHLORIDE: 104 mmol/L (ref 101–111)
CO2: 27 mmol/L (ref 22–32)
CREATININE: 0.8 mg/dL (ref 0.44–1.00)
Calcium: 8.3 mg/dL — ABNORMAL LOW (ref 8.9–10.3)
GFR calc Af Amer: >60 mL/min (ref >60)
GFR calc non Af Amer: >60 mL/min (ref >60)
GLUCOSE: 117 mg/dL — AB (ref 65–99)
Potassium: 3.5 mmol/L (ref 3.5–5.1)
SODIUM: 138 mmol/L (ref 135–145)

## 2015-10-11 LAB — GLUCOSE, CAPILLARY: GLUCOSE-CAPILLARY: 109 mg/dL — AB (ref 65–99)

## 2015-10-11 LAB — PHOSPHORUS: Phosphorus: 3.7 mg/dL (ref 2.5–4.6)

## 2015-10-11 MED ORDER — POTASSIUM CHLORIDE 10 MEQ/100ML IV SOLN
10.0000 meq | INTRAVENOUS | Status: AC
  Administered 2015-10-11 (×2): 10 meq via INTRAVENOUS
  Filled 2015-10-11 (×2): qty 100

## 2015-10-11 MED ORDER — FAT EMULSION 20 % IV EMUL
240.0000 mL | INTRAVENOUS | Status: AC
  Administered 2015-10-11: 240 mL via INTRAVENOUS
  Filled 2015-10-11: qty 250

## 2015-10-11 MED ORDER — TRACE MINERALS CR-CU-MN-SE-ZN 10-1000-500-60 MCG/ML IV SOLN
INTRAVENOUS | Status: AC
  Administered 2015-10-11: 17:00:00 via INTRAVENOUS
  Filled 2015-10-11: qty 1680

## 2015-10-11 NOTE — Progress Notes (Signed)
PARENTERAL NUTRITION CONSULT NOTE - FOLLOW-UP  Pharmacy Consult for TPN Indication: protein calorie malnutrition  No Known Allergies  Patient Measurements: Height: 5' 3"  (160 cm) Weight: 196 lb 3.4 oz (89 kg) IBW/kg (Calculated) : 52.4  Vital Signs: Temp: 99.6 F (37.6 C) (12/02 0551) Temp Source: Oral (12/02 0551) BP: 156/81 mmHg (12/02 0551) Pulse Rate: 101 (12/02 0551) Intake/Output from previous day: 12/01 0701 - 12/02 0700 In: 41112.8 [I.V.:608; Blood:664.5; BZJ:69678.9] Out: 2140 [Urine:1600; Stool:540] Intake/Output from this shift: Total I/O In: -  Out: 675 [Urine:500; Stool:175]  Labs:  Recent Labs  10/09/15 0625 10/10/15 0500 10/11/15 0530  WBC 24.3* 17.1* 15.5*  HGB 7.5* 6.2* 8.4*  HCT 22.3* 18.7* 25.0*  PLT 687* 558* 569*     Recent Labs  10/09/15 0625 10/10/15 0500 10/11/15 0530  NA 134* 138 138  K 4.6 4.1 3.5  CL 99* 104 104  CO2 27 28 27   GLUCOSE 189* 127* 117*  BUN 13 15 17   CREATININE 1.09* 0.98 0.80  CALCIUM 8.4* 8.1* 8.3*  MG 1.8 2.0 2.0  PHOS 3.2 2.8 3.7  PROT 5.8* 5.3*  --   ALBUMIN 2.4* 2.1*  --   AST 19 12*  --   ALT 17 12*  --   ALKPHOS 52 58  --   BILITOT 0.6 0.3  --   PREALBUMIN 16.6*  15.9*  --   --   TRIG 82  82  --   --    Estimated Creatinine Clearance: 79.1 mL/min (by C-G formula based on Cr of 0.8).    Recent Labs  10/10/15 1819 10/10/15 2348 10/11/15 0550  GLUCAP 124* 108* 109*    Medical History: Past Medical History  Diagnosis Date  . Hypertension   . Colitis, ulcerative ([place])   . Hyperlipidemia   . Colon polyps   . Blood transfusion   . Anemia   . Shortness of breath dyspnea     talking or walking   . Type 2 diabetes mellitus with complication, without long-term current use of insulin ([place]) 09/05/2015    currently on no medications   . Numbness and tingling     hands and feet bilat    Insulin Requirements:  6 units Novolog SSI since TPN initiation ( 2 units Pm 12/1)  Current Nutrition:  NPO  IVF: none, d/c'ed by MD this AM  Central access: PICC line placed 10/09/15 TPN start date: 10/09/15   ASSESSMENT                                                                                                          HPI:  50 y/oF approximately 2 weeks s/p total abdominal proctectomy who presented to MD office 11/28 with frank necrosis of her ileostomy and was subsequently admitted to [facility]. Patient underwent ileostomy revision and repair of small bowel enterotomy on 11/29. Pharmacy asked to start TPN for this patient due to protein calorie malnutrition from recent surgery and chronic illness until her bowel function resolves.   Significant events:  11/30: Patient pulled NG tube  out overnight 12/1: Having low grade fevers 12/2: Per surgery, anticipate TPN through Monday 12/5  Today:    Glucose - CBGs at goal (< 150). History of type 2 DM, currently not on any medications (last Hemoglobin A1c 6.6%)  Electrolytes - all WNL, except K+ slightly decreased  Renal - SCr improved to WNL, CrCl ~ 79 ml/min  LFTs - AST/ALT low, Alk Phos, Tbili WNL (12/1)  TGs - 82 (11/30)  Prealbumin - 15.9, 16.6 (11/30)  NUTRITIONAL GOALS                                                                                             RD recs: Kcal: 1550-1750, Protein: 75-85g  Clinimix 5/15 at a goal rate of 70 ml/hr + 20% fat emulsion at 10 ml/hr to provide: 84 g/day protein, 1672 Kcal/day.  PLAN                                                                                                                          Ordered KCl 10 mEq x2   At 1800 today:  Increase Clinimix E 5/15 to 70 ml/hr, at goal  20% fat emulsion at 10 ml/hr.  TPN to contain standard multivitamins and trace elements.  Continue moderate SSI with q6h CBG checks.   TPN lab panels on Mondays & Thursdays.  BMET, Mag, Phos in AM.  F/u daily.   Royetta Asal, PharmD, BCPS Pager [phone] 10/11/2015 11:05 AM

## 2015-10-11 NOTE — Progress Notes (Signed)
Nutrition Follow-up  DOCUMENTATION CODES:   Obesity unspecified  INTERVENTION:  - Continue TPN per pharmacy - Will monitor for diet advancement as medically feasible - RD will continue to follow per protocol  NUTRITION DIAGNOSIS:   Inadequate oral intake related to inability to eat, altered GI function as evidenced by NPO status. -ongoing, TPN infusing  GOAL:   Patient will meet greater than or equal to 90% of their needs -will be met with TPN at goal rate  MONITOR:   Labs, Weight trends, Skin, I & O's, Other (Comment) (TPN)  ASSESSMENT:   59 year old female presenting for a post-operative visit. 59 year old female status post total proctocolectomy with small bowel resection who presents to the office with worsening pain around her ostomy site.   12/2 Pt POD #3 complex ileostomy revision and repair of small bowel enterotomy. Pt currently receiving Clinimix E 5/15 @ 55 mL/hr with 20% lipids @ 10 mL/hr. This regimen is providing 1417 kcal, 66 grams protein.  Pt denies abdominal pain or nausea at this time. NGT remains out with notes indicating need to monitor for possible need for replacement.  Per pharmacy note, plan to increase TPN to goal today at 1800. New TPN regimen will be Clinimix E 5/15 @ 70 mL/hr with 20% lipids @ 10 mL/hr. This will provide 1673 kcal, 84 grams protein which will meet 100% estimated kcal and protein needs.  Family present at bedside ask about recommended foods and beverages for consumption s/p d/c. Informed them that RD will discuss with pt and family closer to d/c as plan per surgery concerning PO nutrition is better known; family understanding and appreciative.  Medications reviewed. Labs reviewed; CBGs: 64-142 mg/dL, Ca: 8.3 mg/dL.   12/1 - Patient continues on TPN, tolerating. - Plan per Pharmacy: At 1800 today:  Increase Clinimix E 5/15 to 55 ml/hr.  20% fat emulsion at 10 ml/hr.  Plan to advance as tolerated to the goal  rate.   11/30 - 11/29: s/p Procedure(s): ILEOSTOMY REVISION, Complex - Patient reports eating well with no appetite changes prior to surgery.  - Pt reports losing weight since surgery but weight history shows that her weight has remained stable.  - Pt pulled out her NGT last night d/t anxiety.  - Pt to begin TPN today per surgery. - Nutrition focused physical exam shows no sign of depletion of muscle mass or body fat.   Diet Order:  Diet NPO time specified Except for: Ice Chips, Sips with Meds .TPN (CLINIMIX-E) Adult TPN (CLINIMIX-E) Adult  Skin:  Wound (see comment) (abdominal incision)  Last BM:  12/2  Height:   Ht Readings from Last 1 Encounters:  10/07/15 5' 3"  (1.6 m)    Weight:   Wt Readings from Last 1 Encounters:  10/07/15 196 lb 3.4 oz (89 kg)    Ideal Body Weight:  52.3 kg  BMI:  Body mass index is 34.77 kg/(m^2).  Estimated Nutritional Needs:   Kcal:  1550-1750  Protein:  75-85g  Fluid:  1.7L/day  EDUCATION NEEDS:   No education needs identified at this time      Jarome Matin, RD, LDN Inpatient Clinical Dietitian Pager # [phone] After hours/weekend pager # [phone]

## 2015-10-11 NOTE — Progress Notes (Signed)
Necrosis of ileostomy ([place])  Postop day 3 from complex ileostomy revision and repair of small bowel enterotomy.    Subjective: Pt had a much better night, pain better with toradol.  Foley out and urinating well.  Ostomy leaked a bit when sitting up last night  Objective: Vital signs in last 24 hours: Temp:  [98.7 F (37.1 C)-100.1 F (37.8 C)] 99.6 F (37.6 C) (12/02 0551) Pulse Rate:  [92-101] 101 (12/02 0551) Resp:  [16-18] 18 (12/02 0551) BP: (120-178)/(72-98) 156/81 mmHg (12/02 0551) SpO2:  [99 %-100 %] 100 % (12/02 0551) Last BM Date: 10/10/15  Intake/Output from previous day: 12/01 0701 - 12/02 0700 In: 41112.8 [I.V.:608; Blood:664.5; ZOX:09604.5] Out: 2140 [Urine:1600; Stool:540] Intake/Output this shift:    General appearance: alert and cooperative GI: soft, no peritoneal signs Incision/Wound: wound VAC in place   Lab Results:  Results for orders placed or performed during the hospital encounter of 10/07/15 (from the past 24 hour(s))  Prepare RBC     Status: None   Collection Time: 10/10/15  8:00 AM  Result Value Ref Range   Order Confirmation ORDER PROCESSED BY BLOOD BANK   Glucose, capillary     Status: Abnormal   Collection Time: 10/10/15 11:42 AM  Result Value Ref Range   Glucose-Capillary 119 (H) 65 - 99 mg/dL  Type and screen Pt has PICC line for screen     Status: None (Preliminary result)   Collection Time: 10/10/15 11:50 AM  Result Value Ref Range   ABO/RH(D) B POS    Antibody Screen NEG    Sample Expiration 10/13/2015    Unit Number [id number]    Blood Component Type RED CELLS,LR    Unit division 00    Status of Unit ISSUED    Transfusion Status OK TO TRANSFUSE    Crossmatch Result Compatible    Unit Number [id number]    Blood Component Type RED CELLS,LR    Unit division 00    Status of Unit ISSUED    Transfusion Status OK TO TRANSFUSE    Crossmatch Result Compatible   Glucose, capillary     Status: Abnormal   Collection Time:  10/10/15  6:19 PM  Result Value Ref Range   Glucose-Capillary 124 (H) 65 - 99 mg/dL  Glucose, capillary     Status: Abnormal   Collection Time: 10/10/15 11:48 PM  Result Value Ref Range   Glucose-Capillary 108 (H) 65 - 99 mg/dL  CBC     Status: Abnormal   Collection Time: 10/11/15  5:30 AM  Result Value Ref Range   WBC 15.5 (H) 4.0 - 10.5 K/uL   RBC 2.95 (L) 3.87 - 5.11 MIL/uL   Hemoglobin 8.4 (L) 12.0 - 15.0 g/dL   HCT 25.0 (L) 36.0 - 46.0 %   MCV 84.7 78.0 - 100.0 fL   MCH 28.5 26.0 - 34.0 pg   MCHC 33.6 30.0 - 36.0 g/dL   RDW 13.9 11.5 - 15.5 %   Platelets 569 (H) 150 - 400 K/uL  Basic metabolic panel     Status: Abnormal   Collection Time: 10/11/15  5:30 AM  Result Value Ref Range   Sodium 138 135 - 145 mmol/L   Potassium 3.5 3.5 - 5.1 mmol/L   Chloride 104 101 - 111 mmol/L   CO2 27 22 - 32 mmol/L   Glucose, Bld 117 (H) 65 - 99 mg/dL   BUN 17 6 - 20 mg/dL   Creatinine, Ser 0.80 0.44 - 1.00 mg/dL  Calcium 8.3 (L) 8.9 - 10.3 mg/dL   GFR calc non Af Amer >60 >60 mL/min   GFR calc Af Amer >60 >60 mL/min   Anion gap 7 5 - 15  Magnesium     Status: None   Collection Time: 10/11/15  5:30 AM  Result Value Ref Range   Magnesium 2.0 1.7 - 2.4 mg/dL  Phosphorus     Status: None   Collection Time: 10/11/15  5:30 AM  Result Value Ref Range   Phosphorus 3.7 2.5 - 4.6 mg/dL  Glucose, capillary     Status: Abnormal   Collection Time: 10/11/15  5:50 AM  Result Value Ref Range   Glucose-Capillary 109 (H) 65 - 99 mg/dL     Studies/Results Radiology     MEDS, Scheduled . acetaminophen  650 mg Oral Q6H  . enoxaparin (LOVENOX) injection  40 mg Subcutaneous Q24H  . insulin aspart  0-15 Units Subcutaneous 4 times per day  . ketorolac  15 mg Intravenous 4 times per day  . predniSONE  5 mg Oral Q breakfast  . sodium chloride  10-40 mL Intracatheter Q12H     Assessment: Necrosis of ileostomy ([place])   Plan: We will continue nothing by mouth and TPN until Mon. Due to her  protein calorie malnutrition from recent surgery and chronic illness I have started TPN and will cont until her bowel function is better. Given this malnutrition and her steroid use, she is at high risk for postoperative complications due to healing difficulties. We discussed whether to replace the NG tube. We will reassess daily. If she becomes distended or begins to vomit, we will replace the tube. I will use Toradol to help with additional pain control. We will switch her anxiety medication to IV.  Cont scheduled tylenol for low grade fevers.  WBC trending down.  Hgb responded appropriately to transfusion yesterday.  LOS: 4 days    Rosario Adie, MD [facility], [place]   10/11/2015 7:38 AM

## 2015-10-11 NOTE — Consult Note (Signed)
WOC wound consult note Reason for Consult:Abdominal surgical wound.  NPWT (VAC) therapy in place.  Suction maintained at 143mHg without difficulty.  Patient assisted up to bathroom and suction is maintained.  Upper border of ostomy pouch taped this AM to promote seal.  Patient states that her pouch became very full and pulled on the seal with the weight of it all.  Encouraged to empty frequently and call for assistance when needed.  Husband at bedside and agrees to do this and help remind his wife as needed.  VAC dressing and ostomy pouch are securely intact.  Bedside RN states that device has not alarmed overnight.  WOC ostomy follow up Stoma type/location: midline abdominal, surrounded by NPWT dressing.  Output liquid, dark effluent Ostomy pouching: 1pc.convex pouch  LKellie Simmering#[phone] Supplies in room  Ostomy pouch and NPWT dressing are secure at this time. No further interventions needed.  WStoverteam will follow and remain available to patient, medical and nursing teams.  KDomenic MorasRN BSN CGrand CouleePager [phone]

## 2015-10-11 NOTE — Plan of Care (Signed)
Problem: Education: Goal: Knowledge of [facility] Education information/materials will improve Outcome: Progressing Pt, husband and 2 daughters very interested in and  knowledgeable about pt's disease progression and care, as evidenced by verbalization of same, and by appropriate questions asked.  Pt participates in self care, including emptying colostomy.  Pt and family teaching re: importance of increased ambulation; compliance noted--ambulation in hallway this shift;   Comments:  Pt, husband and 2 daughters very interested in and  knowledgeable about pt's disease progression and care, as evidenced by verbalization of same, and by appropriate questions asked.  Pt participates in self care, including emptying colostomy.  Pt and family teaching re: importance of increased ambulation; compliance noted--ambulation in hallway this shift;  12:00 BG 118;

## 2015-10-12 LAB — BASIC METABOLIC PANEL
ANION GAP: 9 (ref 5–15)
BUN: 21 mg/dL — ABNORMAL HIGH (ref 6–20)
CALCIUM: 8.2 mg/dL — AB (ref 8.9–10.3)
CO2: 23 mmol/L (ref 22–32)
Chloride: 103 mmol/L (ref 101–111)
Creatinine, Ser: 0.83 mg/dL (ref 0.44–1.00)
GFR calc non Af Amer: >60 mL/min (ref >60)
GLUCOSE: 124 mg/dL — AB (ref 65–99)
POTASSIUM: 3.8 mmol/L (ref 3.5–5.1)
Sodium: 135 mmol/L (ref 135–145)

## 2015-10-12 LAB — GLUCOSE, CAPILLARY
GLUCOSE-CAPILLARY: 113 mg/dL — AB (ref 65–99)
Glucose-Capillary: 130 mg/dL — ABNORMAL HIGH (ref 65–99)

## 2015-10-12 LAB — MAGNESIUM: Magnesium: 2.1 mg/dL (ref 1.7–2.4)

## 2015-10-12 LAB — PHOSPHORUS: PHOSPHORUS: 5 mg/dL — AB (ref 2.5–4.6)

## 2015-10-12 MED ORDER — OLMESARTAN MEDOXOMIL-HCTZ 40-12.5 MG PO TABS: 1.0000 | ORAL_TABLET | Freq: Every day | ORAL | Status: DC

## 2015-10-12 MED ORDER — SALINE SPRAY 0.65 % NA SOLN
1.0000 | NASAL | Status: DC | PRN
  Administered 2015-10-12: 1 via NASAL
  Filled 2015-10-12: qty 44

## 2015-10-12 MED ORDER — TRACE MINERALS CR-CU-MN-SE-ZN 10-1000-500-60 MCG/ML IV SOLN
INTRAVENOUS | Status: AC
  Administered 2015-10-12: 17:00:00 via INTRAVENOUS
  Filled 2015-10-12: qty 1680

## 2015-10-12 MED ORDER — FAT EMULSION 20 % IV EMUL
240.0000 mL | INTRAVENOUS | Status: AC
  Administered 2015-10-12: 240 mL via INTRAVENOUS
  Filled 2015-10-12: qty 250

## 2015-10-12 MED ORDER — IRBESARTAN 300 MG PO TABS
300.0000 mg | ORAL_TABLET | Freq: Every day | ORAL | Status: DC
  Administered 2015-10-12 – 2015-10-21 (×9): 300 mg via ORAL
  Filled 2015-10-12 (×11): qty 1

## 2015-10-12 MED ORDER — HYDROCHLOROTHIAZIDE 12.5 MG PO CAPS
12.5000 mg | ORAL_CAPSULE | Freq: Every day | ORAL | Status: DC
  Administered 2015-10-12 – 2015-10-21 (×9): 12.5 mg via ORAL
  Filled 2015-10-12 (×11): qty 1

## 2015-10-12 NOTE — Progress Notes (Signed)
Patient ID: Latasha Wood, female   DOB: 12-17-1955, 59 y.o.   MRN: [id number]  [facility]  POD#: 4  Subjective: Patient up in chair, family at bedside.  Persistent small bleed per rectum, nose bleed x 2.  Objective: Vital signs in last 24 hours: Temp:  [99.3 F (37.4 C)-99.7 F (37.6 C)] 99.3 F (37.4 C) (12/03 0600) Pulse Rate:  [91-94] 94 (12/03 0600) Resp:  [16-18] 16 (12/03 0600) BP: (149-152)/(76-86) 149/86 mmHg (12/03 0600) SpO2:  [100 %] 100 % (12/03 0600) Last BM Date: 10/11/15 (colostomy- green drainage)  Intake/Output from previous day: 12/02 0701 - 12/03 0700 In: 1830.6 [P.O.:50; I.V.:133.3; IV Piggyback:200; TPN:1447.3] Out: 2290 [Urine:1900; Stool:390] Intake/Output this shift: Total I/O In: 10 [I.V.:10] Out: -   Physical Exam: HEENT - sclerae clear, mucous membranes moist Neck - soft Chest - clear bilaterally Cor - RRR Abdomen - soft, obese; BS present; VAC in place, small liquid succus in midline ostomy bag, no bleeding Ext - no edema, non-tender Neuro - alert & oriented, no focal deficits  Lab Results:   Recent Labs  10/10/15 0500 10/11/15 0530  WBC 17.1* 15.5*  HGB 6.2* 8.4*  HCT 18.7* 25.0*  PLT 558* 569*   BMET  Recent Labs  10/11/15 0530 10/12/15 0450  NA 138 135  K 3.5 3.8  CL 104 103  CO2 27 23  GLUCOSE 117* 124*  BUN 17 21*  CREATININE 0.80 0.83  CALCIUM 8.3* 8.2*   PT/INR No results for input(s): LABPROT, INR in the last 72 hours. Comprehensive Metabolic Panel:    Component Value Date/Time   NA 135 10/12/2015 0450   NA 138 10/11/2015 0530   NA 139 07/31/2015 1001   K 3.8 10/12/2015 0450   K 3.5 10/11/2015 0530   CL 103 10/12/2015 0450   CL 104 10/11/2015 0530   CO2 23 10/12/2015 0450   CO2 27 10/11/2015 0530   BUN 21* 10/12/2015 0450   BUN 17 10/11/2015 0530   BUN 20 07/31/2015 1001   CREATININE 0.83 10/12/2015 0450   CREATININE 0.80 10/11/2015 0530   GLUCOSE 124*  10/12/2015 0450   GLUCOSE 117* 10/11/2015 0530   GLUCOSE 86 07/31/2015 1001   CALCIUM 8.2* 10/12/2015 0450   CALCIUM 8.3* 10/11/2015 0530   AST 12* 10/10/2015 0500   AST 19 10/09/2015 0625   ALT 12* 10/10/2015 0500   ALT 17 10/09/2015 0625   ALKPHOS 58 10/10/2015 0500   ALKPHOS 52 10/09/2015 0625   BILITOT 0.3 10/10/2015 0500   BILITOT 0.6 10/09/2015 0625   BILITOT 0.4 07/31/2015 1001   PROT 5.3* 10/10/2015 0500   PROT 5.8* 10/09/2015 0625   PROT 6.7 07/31/2015 1001   ALBUMIN 2.1* 10/10/2015 0500   ALBUMIN 2.4* 10/09/2015 0625   ALBUMIN 3.9 07/31/2015 1001    Studies/Results: No results found.  Anti-infectives: Anti-infectives    Start     Dose/Rate Route Frequency Ordered Stop   10/07/15 1700  [MAR Hold]  ertapenem (INVANZ) 1 g in sodium chloride 0.9 % 50 mL IVPB  Status:  Discontinued     (MAR Hold since 10/08/15 1224)   1 g 100 mL/hr over 30 Minutes Intravenous Every 24 hours 10/07/15 1643 10/08/15 1809      Assessment & Plans: Necrosis of ileostomy  NPO, IVF, TNA  OOB, ambulate  Leave VAC intact until Monday if possible  Restart Benicar for HTN  Saline nasal spray for nose bleeds  Earnstine Regal,  MD, [facility] Office: [place] 10/12/2015

## 2015-10-12 NOTE — Progress Notes (Signed)
PARENTERAL NUTRITION CONSULT NOTE - FOLLOW-UP  Pharmacy Consult for TPN Indication: protein calorie malnutrition  No Known Allergies  Patient Measurements: Height: 5' 3"  (160 cm) Weight: 196 lb 3.4 oz (89 kg) IBW/kg (Calculated) : 52.4  Vital Signs: Temp: 99.3 F (37.4 C) (12/03 0600) Temp Source: Oral (12/03 0600) BP: 149/86 mmHg (12/03 0600) Pulse Rate: 94 (12/03 0600) Intake/Output from previous day: 12/02 0701 - 12/03 0700 In: 1830.6 [P.O.:50; I.V.:133.3; IV Piggyback:200; TPN:1447.3] Out: 2290 [Urine:1900; Stool:390] Intake/Output from this shift:    Labs:  Recent Labs  10/10/15 0500 10/11/15 0530  WBC 17.1* 15.5*  HGB 6.2* 8.4*  HCT 18.7* 25.0*  PLT 558* 569*     Recent Labs  10/10/15 0500 10/11/15 0530 10/12/15 0450  NA 138 138 135  K 4.1 3.5 3.8  CL 104 104 103  CO2 28 27 23   GLUCOSE 127* 117* 124*  BUN 15 17 21*  CREATININE 0.98 0.80 0.83  CALCIUM 8.1* 8.3* 8.2*  MG 2.0 2.0 2.1  PHOS 2.8 3.7 5.0*  PROT 5.3*  --   --   ALBUMIN 2.1*  --   --   AST 12*  --   --   ALT 12*  --   --   ALKPHOS 58  --   --   BILITOT 0.3  --   --    Estimated Creatinine Clearance: 76.2 mL/min (by C-G formula based on Cr of 0.83).    Recent Labs  10/10/15 1819 10/10/15 2348 10/11/15 0550  GLUCAP 124* 108* 109*    Medical History: Past Medical History  Diagnosis Date  . Hypertension   . Colitis, ulcerative ([place])   . Hyperlipidemia   . Colon polyps   . Blood transfusion   . Anemia   . Shortness of breath dyspnea     talking or walking   . Type 2 diabetes mellitus with complication, without long-term current use of insulin ([place]) 09/05/2015    currently on no medications   . Numbness and tingling     hands and feet bilat    Insulin Requirements:  2 units Novolog SSI yesterday  Current Nutrition: NPO  IVF: none  Central access: PICC line placed 10/09/15 TPN start date: 10/09/15   ASSESSMENT                                                                                                           HPI:  64 y/oF approximately 2 weeks s/p total abdominal proctectomy who presented to MD office 11/28 with frank necrosis of her ileostomy and was subsequently admitted to [facility]. Patient underwent ileostomy revision and repair of small bowel enterotomy on 11/29. Pharmacy asked to start TPN for this patient due to protein calorie malnutrition from recent surgery and chronic illness until her bowel function resolves.   Significant events:  11/30: Patient pulled NG tube out overnight 12/1: Having low grade fevers 12/2: Per surgery, anticipate TPN through Monday 12/5  Today:    Glucose - CBGs at goal (< 150). History of type 2 DM, currently  not on any medications (last Hemoglobin A1c 6.6%)  Electrolytes - all WNL, except Phos slightly high, CCo WNL, Ca-Phos product < 55  Renal - SCr  WNL, CrCl ~ 76 ml/min  LFTs - AST/ALT low, Alk Phos, Tbili WNL (12/1)  TGs - 82 (11/30)  Prealbumin - 15.9, 16.6 (11/30)  NUTRITIONAL GOALS                                                                                             RD recs: Kcal: 1550-1750, Protein: 75-85g  Clinimix 5/15 at a goal rate of 70 ml/hr + 20% fat emulsion at 10 ml/hr to provide: 84 g/day protein, 1672 Kcal/day.  PLAN                                                                                                                          At 1800 today:  Continue Clinimix E 5/15 at 70 ml/hr (goal)  Continue 20% fat emulsion at 10 ml/hr.  TPN to contain standard multivitamins and trace elements.  Continue moderate SSI with q6h CBG checks.   TPN lab panels on Mondays & Thursdays.  BMET, Mag, Phos in AM.  F/u daily.   Dolly Rias RPh 10/12/2015, 10:18 AM Pager [phone]

## 2015-10-13 LAB — BASIC METABOLIC PANEL
Anion gap: 9 (ref 5–15)
BUN: 19 mg/dL (ref 6–20)
CHLORIDE: 102 mmol/L (ref 101–111)
CO2: 26 mmol/L (ref 22–32)
CREATININE: 0.82 mg/dL (ref 0.44–1.00)
Calcium: 8.6 mg/dL — ABNORMAL LOW (ref 8.9–10.3)
GFR calc non Af Amer: >60 mL/min (ref >60)
Glucose, Bld: 99 mg/dL (ref 65–99)
POTASSIUM: 3.8 mmol/L (ref 3.5–5.1)
Sodium: 137 mmol/L (ref 135–145)

## 2015-10-13 LAB — PHOSPHORUS: Phosphorus: 4.2 mg/dL (ref 2.5–4.6)

## 2015-10-13 LAB — GLUCOSE, CAPILLARY
GLUCOSE-CAPILLARY: 106 mg/dL — AB (ref 65–99)
GLUCOSE-CAPILLARY: 111 mg/dL — AB (ref 65–99)
GLUCOSE-CAPILLARY: 118 mg/dL — AB (ref 65–99)
GLUCOSE-CAPILLARY: 128 mg/dL — AB (ref 65–99)
GLUCOSE-CAPILLARY: 136 mg/dL — AB (ref 65–99)
GLUCOSE-CAPILLARY: 138 mg/dL — AB (ref 65–99)
Glucose-Capillary: 105 mg/dL — ABNORMAL HIGH (ref 65–99)
Glucose-Capillary: 148 mg/dL — ABNORMAL HIGH (ref 65–99)

## 2015-10-13 LAB — MAGNESIUM: Magnesium: 2.1 mg/dL (ref 1.7–2.4)

## 2015-10-13 MED ORDER — FAT EMULSION 20 % IV EMUL
240.0000 mL | INTRAVENOUS | Status: AC
  Administered 2015-10-13: 240 mL via INTRAVENOUS
  Filled 2015-10-13: qty 250

## 2015-10-13 MED ORDER — ACETAMINOPHEN 325 MG PO TABS
650.0000 mg | ORAL_TABLET | Freq: Four times a day (QID) | ORAL | Status: DC | PRN
  Administered 2015-10-13 – 2015-10-24 (×28): 650 mg via ORAL
  Filled 2015-10-13 (×27): qty 2

## 2015-10-13 MED ORDER — M.V.I. ADULT IV INJ
INJECTION | INTRAVENOUS | Status: AC
  Administered 2015-10-13: 17:00:00 via INTRAVENOUS
  Filled 2015-10-13: qty 1680

## 2015-10-13 NOTE — Progress Notes (Signed)
PARENTERAL NUTRITION CONSULT NOTE - FOLLOW-UP  Pharmacy Consult for TPN Indication: protein calorie malnutrition  No Known Allergies  Patient Measurements: Height: 5' 3"  (160 cm) Weight: 196 lb 3.4 oz (89 kg) IBW/kg (Calculated) : 52.4  Vital Signs: Temp: 99.9 F (37.7 C) (12/04 0600) Temp Source: Oral (12/04 0600) BP: 130/74 mmHg (12/04 0600) Pulse Rate: 99 (12/04 0600) Intake/Output from previous day: 12/03 0701 - 12/04 0700 In: 1610 [I.V.:10; TPN:1600] Out: 1800 [Urine:1500; Stool:300] Intake/Output from this shift:    Labs:  Recent Labs  10/11/15 0530  WBC 15.5*  HGB 8.4*  HCT 25.0*  PLT 569*     Recent Labs  10/11/15 0530 10/12/15 0450 10/13/15 0500  NA 138 135 137  K 3.5 3.8 3.8  CL 104 103 102  CO2 27 23 26   GLUCOSE 117* 124* 99  BUN 17 21* 19  CREATININE 0.80 0.83 0.82  CALCIUM 8.3* 8.2* 8.6*  MG 2.0 2.1 2.1  PHOS 3.7 5.0* 4.2   Estimated Creatinine Clearance: 77.2 mL/min (by C-G formula based on Cr of 0.82).    Recent Labs  10/12/15 1842 10/13/15 0005 10/13/15 0606  GLUCAP 130* 106* 111*    Medical History: Past Medical History  Diagnosis Date  . Hypertension   . Colitis, ulcerative ([place])   . Hyperlipidemia   . Colon polyps   . Blood transfusion   . Anemia   . Shortness of breath dyspnea     talking or walking   . Type 2 diabetes mellitus with complication, without long-term current use of insulin ([place]) 09/05/2015    currently on no medications   . Numbness and tingling     hands and feet bilat    Insulin Requirements:  2 units Novolog SSI yesterday  Current Nutrition: NPO  IVF: none  Central access: PICC line placed 10/09/15 TPN start date: 10/09/15   ASSESSMENT                                                                                                          HPI:  22 y/oF approximately 2 weeks s/p total abdominal proctectomy who presented to MD office 11/28 with frank necrosis of her ileostomy and was  subsequently admitted to [facility]. Patient underwent ileostomy revision and repair of small bowel enterotomy on 11/29. Pharmacy asked to start TPN for this patient due to protein calorie malnutrition from recent surgery and chronic illness until her bowel function resolves.   Significant events:  11/30: Patient pulled NG tube out overnight 12/1: Having low grade fevers 12/2: Per surgery, anticipate TPN through Monday 12/5  Today:    Glucose - CBGs at goal (< 150). History of type 2 DM, currently not on any medications (last Hemoglobin A1c 6.6%)  Electrolytes - all WNL, CCa WNL  Renal - SCr  WNL, CrCl ~ 77 ml/min  LFTs - AST/ALT low, Alk Phos, Tbili WNL (12/1)  TGs - 82 (11/30)  Prealbumin - 15.9, 16.6 (11/30)  NUTRITIONAL GOALS  RD recs: Kcal: 1550-1750, Protein: 75-85g  Clinimix 5/15 at a goal rate of 70 ml/hr + 20% fat emulsion at 10 ml/hr to provide: 84 g/day protein, 1672 Kcal/day.  PLAN                                                                                                                          At 1800 today:  Continue Clinimix E 5/15 at 70 ml/hr (goal)  Continue 20% fat emulsion at 10 ml/hr.  TPN to contain standard multivitamins and trace elements.  Continue moderate SSI with q6h CBG checks.   TPN lab panels on Mondays & Thursdays.  F/u daily.   Dolly Rias RPh 10/13/2015, 7:22 AM Pager [phone]

## 2015-10-13 NOTE — Progress Notes (Signed)
Patient ID: Latasha Wood, female   DOB: 07-07-1956, 59 y.o.   MRN: [id number]  [facility]  POD#: 5  Subjective: Patient in bed, family at bedside.  Pain controlled.  No further nose bleeds.  Objective: Vital signs in last 24 hours: Temp:  [99.2 F (37.3 C)-99.9 F (37.7 C)] 99.9 F (37.7 C) (12/04 0600) Pulse Rate:  [96-99] 99 (12/04 0600) Resp:  [16] 16 (12/04 0600) BP: (130-137)/(73-74) 130/74 mmHg (12/04 0600) SpO2:  [99 %-100 %] 100 % (12/04 0600) Last BM Date: 10/12/15  Intake/Output from previous day: 12/03 0701 - 12/04 0700 In: 1610 [I.V.:10; TPN:1600] Out: 1800 [Urine:1500; Stool:300] Intake/Output this shift:    Physical Exam: HEENT - sclerae clear, mucous membranes moist Neck - soft Chest - clear bilaterally Cor - RRR Abdomen - soft, obese; VAC dressing intact with seal; stoma viable with thin succus in bag Ext - no edema, non-tender Neuro - alert & oriented, no focal deficits  Lab Results:   Recent Labs  10/11/15 0530  WBC 15.5*  HGB 8.4*  HCT 25.0*  PLT 569*   BMET  Recent Labs  10/12/15 0450 10/13/15 0500  NA 135 137  K 3.8 3.8  CL 103 102  CO2 23 26  GLUCOSE 124* 99  BUN 21* 19  CREATININE 0.83 0.82  CALCIUM 8.2* 8.6*   PT/INR No results for input(s): LABPROT, INR in the last 72 hours. Comprehensive Metabolic Panel:    Component Value Date/Time   NA 137 10/13/2015 0500   NA 135 10/12/2015 0450   NA 139 07/31/2015 1001   K 3.8 10/13/2015 0500   K 3.8 10/12/2015 0450   CL 102 10/13/2015 0500   CL 103 10/12/2015 0450   CO2 26 10/13/2015 0500   CO2 23 10/12/2015 0450   BUN 19 10/13/2015 0500   BUN 21* 10/12/2015 0450   BUN 20 07/31/2015 1001   CREATININE 0.82 10/13/2015 0500   CREATININE 0.83 10/12/2015 0450   GLUCOSE 99 10/13/2015 0500   GLUCOSE 124* 10/12/2015 0450   GLUCOSE 86 07/31/2015 1001   CALCIUM 8.6* 10/13/2015 0500   CALCIUM 8.2* 10/12/2015 0450   AST 12* 10/10/2015  0500   AST 19 10/09/2015 0625   ALT 12* 10/10/2015 0500   ALT 17 10/09/2015 0625   ALKPHOS 58 10/10/2015 0500   ALKPHOS 52 10/09/2015 0625   BILITOT 0.3 10/10/2015 0500   BILITOT 0.6 10/09/2015 0625   BILITOT 0.4 07/31/2015 1001   PROT 5.3* 10/10/2015 0500   PROT 5.8* 10/09/2015 0625   PROT 6.7 07/31/2015 1001   ALBUMIN 2.1* 10/10/2015 0500   ALBUMIN 2.4* 10/09/2015 0625   ALBUMIN 3.9 07/31/2015 1001    Studies/Results: No results found.  Anti-infectives: Anti-infectives    Start     Dose/Rate Route Frequency Ordered Stop   10/07/15 1700  [MAR Hold]  ertapenem (INVANZ) 1 g in sodium chloride 0.9 % 50 mL IVPB  Status:  Discontinued     (MAR Hold since 10/08/15 1224)   1 g 100 mL/hr over 30 Minutes Intravenous Every 24 hours 10/07/15 1643 10/08/15 1809      Assessment & Plans: Necrosis of ileostomy NPO, IVF, TNA OOB, ambulate Leave VAC intact until Monday if possible Benicar for HTN Saline nasal spray - no further nose bleeds  Earnstine Regal, MD, [facility] Office: [place] 10/13/2015

## 2015-10-14 LAB — DIFFERENTIAL
BASOS PCT: 0 %
Basophils Absolute: 0 10*3/uL (ref 0.0–0.1)
EOS PCT: 2 %
Eosinophils Absolute: 0.2 10*3/uL (ref 0.0–0.7)
LYMPHS ABS: 2.1 10*3/uL (ref 0.7–4.0)
Lymphocytes Relative: 19 %
MONO ABS: 1.4 10*3/uL — AB (ref 0.1–1.0)
Monocytes Relative: 12 %
Neutro Abs: 7.6 10*3/uL (ref 1.7–7.7)
Neutrophils Relative %: 67 %

## 2015-10-14 LAB — COMPREHENSIVE METABOLIC PANEL
ALBUMIN: 2.1 g/dL — AB (ref 3.5–5.0)
ALK PHOS: 65 U/L (ref 38–126)
ALT: 15 U/L (ref 14–54)
AST: 18 U/L (ref 15–41)
Anion gap: 9 (ref 5–15)
BUN: 20 mg/dL (ref 6–20)
CALCIUM: 8.7 mg/dL — AB (ref 8.9–10.3)
CO2: 27 mmol/L (ref 22–32)
CREATININE: 0.85 mg/dL (ref 0.44–1.00)
Chloride: 101 mmol/L (ref 101–111)
GFR calc non Af Amer: >60 mL/min (ref >60)
GLUCOSE: 110 mg/dL — AB (ref 65–99)
Potassium: 3.7 mmol/L (ref 3.5–5.1)
SODIUM: 137 mmol/L (ref 135–145)
Total Bilirubin: 0.5 mg/dL (ref 0.3–1.2)
Total Protein: 6.3 g/dL — ABNORMAL LOW (ref 6.5–8.1)

## 2015-10-14 LAB — GLUCOSE, CAPILLARY
GLUCOSE-CAPILLARY: 101 mg/dL — AB (ref 65–99)
GLUCOSE-CAPILLARY: 103 mg/dL — AB (ref 65–99)
Glucose-Capillary: 108 mg/dL — ABNORMAL HIGH (ref 65–99)
Glucose-Capillary: 145 mg/dL — ABNORMAL HIGH (ref 65–99)
Glucose-Capillary: 124 mg/dL — ABNORMAL HIGH (ref 65–99)

## 2015-10-14 LAB — CBC
HEMATOCRIT: 23.8 % — AB (ref 36.0–46.0)
Hemoglobin: 8 g/dL — ABNORMAL LOW (ref 12.0–15.0)
MCH: 29.3 pg (ref 26.0–34.0)
MCHC: 33.6 g/dL (ref 30.0–36.0)
MCV: 87.2 fL (ref 78.0–100.0)
Platelets: 687 10*3/uL — ABNORMAL HIGH (ref 150–400)
RBC: 2.73 MIL/uL — ABNORMAL LOW (ref 3.87–5.11)
RDW: 13.7 % (ref 11.5–15.5)
WBC: 11.3 10*3/uL — ABNORMAL HIGH (ref 4.0–10.5)

## 2015-10-14 LAB — MAGNESIUM: Magnesium: 2 mg/dL (ref 1.7–2.4)

## 2015-10-14 LAB — PREALBUMIN: Prealbumin: 11.2 mg/dL — ABNORMAL LOW (ref 18–38)

## 2015-10-14 LAB — PHOSPHORUS: Phosphorus: 4.7 mg/dL — ABNORMAL HIGH (ref 2.5–4.6)

## 2015-10-14 LAB — TRIGLYCERIDES: Triglycerides: 166 mg/dL — ABNORMAL HIGH (ref <150)

## 2015-10-14 MED ORDER — TRACE MINERALS CR-CU-MN-SE-ZN 10-1000-500-60 MCG/ML IV SOLN
INTRAVENOUS | Status: AC
  Administered 2015-10-14: 18:00:00 via INTRAVENOUS
  Filled 2015-10-14: qty 1680

## 2015-10-14 MED ORDER — FAT EMULSION 20 % IV EMUL
240.0000 mL | INTRAVENOUS | Status: AC
  Administered 2015-10-14: 240 mL via INTRAVENOUS
  Filled 2015-10-14: qty 250

## 2015-10-14 MED ORDER — POTASSIUM CHLORIDE 10 MEQ/100ML IV SOLN
10.0000 meq | INTRAVENOUS | Status: AC
  Administered 2015-10-14 (×2): 10 meq via INTRAVENOUS
  Filled 2015-10-14 (×2): qty 100

## 2015-10-14 NOTE — Consult Note (Signed)
WOC ostomy follow up Seen today with Dr. Marcello Moores Stoma type/location: Midline, separates full thickness wounds Stomal assessment/size: 1 and 1/8 inch slightly oval, succus in pouch, no flatus, small rim of subcutaneous fat on bowel, majority of stoma is pink. Peristomal assessment: itnact Treatment options for stomal/peristomal skin: skin barrier ring Output Succus Ostomy pouching: 1pc.convex pouch with skin barrier ring  Education provided: No specific today Enrolled patient in [employer] Discharge program: Yes, previously.  Will need to convey new supply needs once those are determined   WOC wound follow up Wound type:Surgical wound, complex Measurement: per Thursday, 10/10/15 Wound bed:red, moist Drainage (amount, consistency, odor) serous Periwound:intact Dressing procedure/placement/frequency: Old NPWT dressings (7 pieces, 5 Black, 2 white) removed and patient expresses discomfort.  Medicated with 2m MSO4. Wound cleansed with NS, and gently patted dry. Periwound protected with drape. Midline defects filled with white foam (2), topped with black foam (2) and after filling old ostomy site with black foam (1), wounds bridged with (2) two additional pieces of black foam.  An ostomy skin barrier ring is placed around the stoma and the ostomy pouch attached.  Seal achieved on attempt #2.   WOC nurses will follow, and will remain available to this patient, the nursing, surgical and medical teams.  Next planned dressing change is Wednesday, 10/16/15.  Thanks, LMaudie Flakes MSN, RN, [place] CArther Abbott Pager# [phone]

## 2015-10-14 NOTE — Progress Notes (Signed)
PARENTERAL NUTRITION CONSULT NOTE - FOLLOW-UP  Pharmacy Consult for TPN Indication: protein calorie malnutrition  No Known Allergies  Patient Measurements: Height: 5' 3"  (160 cm) Weight: 196 lb 3.4 oz (89 kg) IBW/kg (Calculated) : 52.4  Vital Signs: Temp: 98.4 F (36.9 C) (12/05 0615) Temp Source: Oral (12/05 0615) BP: 116/67 mmHg (12/05 0939) Pulse Rate: 87 (12/05 0939) Intake/Output from previous day: 12/04 0701 - 12/05 0700 In: 1917.3 [TPN:1917.3] Out: 550 [Urine:450; Stool:100] Intake/Output from this shift:    Labs:  Recent Labs  10/14/15 0505  WBC 11.3*  HGB 8.0*  HCT 23.8*  PLT 687*     Recent Labs  10/12/15 0450 10/13/15 0500 10/14/15 0505  NA 135 137 137  K 3.8 3.8 3.7  CL 103 102 101  CO2 23 26 27   GLUCOSE 124* 99 110*  BUN 21* 19 20  CREATININE 0.83 0.82 0.85  CALCIUM 8.2* 8.6* 8.7*  MG 2.1 2.1 2.0  PHOS 5.0* 4.2 4.7*  PROT  --   --  6.3*  ALBUMIN  --   --  2.1*  AST  --   --  18  ALT  --   --  15  ALKPHOS  --   --  65  BILITOT  --   --  0.5  PREALBUMIN  --   --  11.2*  TRIG  --   --  166*   Estimated Creatinine Clearance: 74.4 mL/min (by C-G formula based on Cr of 0.85).    Recent Labs  10/13/15 1739 10/14/15 0053 10/14/15 0612  GLUCAP 128* 103* 108*    Medical History: Past Medical History  Diagnosis Date  . Hypertension   . Colitis, ulcerative ([place])   . Hyperlipidemia   . Colon polyps   . Blood transfusion   . Anemia   . Shortness of breath dyspnea     talking or walking   . Type 2 diabetes mellitus with complication, without long-term current use of insulin ([place]) 09/05/2015    currently on no medications   . Numbness and tingling     hands and feet bilat    Insulin Requirements:  4 units Novolog SSI yesterday  Current Nutrition: NPO  IVF: none  Central access: PICC line placed 10/09/15 TPN start date: 10/09/15   ASSESSMENT                                                                                                           HPI:  29 y/oF approximately 2 weeks s/p total abdominal proctectomy who presented to MD office 11/28 with frank necrosis of her ileostomy and was subsequently admitted to [facility]. Patient underwent ileostomy revision and repair of small bowel enterotomy on 11/29. Pharmacy asked to start TPN for this patient due to protein calorie malnutrition from recent surgery and chronic illness until her bowel function resolves.   Significant events:  11/30: Patient pulled NG tube out overnight 12/1: Having low grade fevers 12/2: Per surgery, anticipate TPN at least  through Monday 12/5 12/5: continue TPN per surgery, may have  clears for comfort  Today:    Glucose - CBGs at goal (< 150). History of type 2 DM, currently not on any medications (last Hemoglobin A1c 6.6%)  Electrolytes - all WNL except K+ slightly low phos slightly elevated, CCa WNL, Ca-Phos product less than 55  Renal - SCr  WNL, CrCl ~ 77 ml/min  LFTs - AST/ALT low, Alk Phos, Tbili WNL (12/1)  TGs - 82 (11/30), 166 (12/5)  Prealbumin - 15.9, 16.6 (11/30), 12/5: 11.2  NUTRITIONAL GOALS                                                                                             RD recs: Kcal: 1550-1750, Protein: 75-85g  Clinimix 5/15 at a goal rate of 70 ml/hr + 20% fat emulsion at 10 ml/hr to provide: 84 g/day protein, 1672 Kcal/day.  PLAN                                                                                                                          At 1800 today:  Continue Clinimix E 5/15 at 70 ml/hr (goal)  Continue 20% fat emulsion at 10 ml/hr.  TPN to contain standard multivitamins and trace elements.  KCl 10 mEq IV x 2  Continue moderate SSI with q6h CBG checks.   TPN lab panels on Mondays & Thursdays.  BMET, Mag, Phos in AM.  F/u daily.   Royetta Asal, PharmD, BCPS Pager [phone] 10/14/2015 10:29 AM

## 2015-10-14 NOTE — Progress Notes (Signed)
Nutrition Follow-up  DOCUMENTATION CODES:   Obesity unspecified  INTERVENTION:   TPN per Pharmacy Diet advancement per MD RD to continue to monitor  NUTRITION DIAGNOSIS:   Inadequate oral intake related to inability to eat, altered GI function as evidenced by NPO status.  Ongoing.  GOAL:   Patient will meet greater than or equal to 90% of their needs  Meeting with TPN at goal.  MONITOR:   Labs, Weight trends, Skin, I & O's, Other (Comment) (TPN)  ASSESSMENT:   59 year old female presenting for a post-operative visit. 59 year old female status post total proctocolectomy with small bowel resection who presents to the office with worsening pain around her ostomy site.   TPN now at goal. Patient is receiving clear liquids as needed for comfort.   Plan per Pharmacy: At 1800 today:  Continue Clinimix E 5/15 at 70 ml/hr (goal)  Continue 20% fat emulsion at 10 ml/hr.  Labs reviewed: CBGs: 103-145 Elevated Phos Mg WNL  Diet Order:  TPN (CLINIMIX-E) Adult Diet NPO time specified Except for: Ice Chips, Sips with Meds TPN (CLINIMIX-E) Adult  Skin:  Wound (see comment) (abdominal incision)  Last BM:  12/4  Height:   Ht Readings from Last 1 Encounters:  10/07/15 5' 3"  (1.6 m)    Weight:   Wt Readings from Last 1 Encounters:  10/07/15 196 lb 3.4 oz (89 kg)    Ideal Body Weight:  52.3 kg  BMI:  Body mass index is 34.77 kg/(m^2).  Estimated Nutritional Needs:   Kcal:  1550-1750  Protein:  75-85g  Fluid:  1.7L/day  EDUCATION NEEDS:   No education needs identified at this time  Clayton Bibles, MS, RD, LDN Pager: [phone] After Hours Pager: [phone]

## 2015-10-14 NOTE — Progress Notes (Signed)
Patient ID: Latasha Wood, female   DOB: 09/11/1956, 59 y.o.   MRN: [id number]  Latasha Wood, P.A.  POD#: 5  Subjective: Patient in bed, family at bedside.  Pain controlled.  Having some ostomy output but no air in the bag  Objective: Vital signs in last 24 hours: Temp:  [98.4 F (36.9 C)-100.4 F (38 C)] 98.4 F (36.9 C) (12/05 0615) Pulse Rate:  [88-99] 92 (12/05 0615) Resp:  [16-18] 16 (12/05 0615) BP: (112-125)/(59-70) 117/59 mmHg (12/05 0615) SpO2:  [99 %-100 %] 99 % (12/05 0615) Last BM Date: 10/13/15  Intake/Output from previous day: 12/04 0701 - 12/05 0700 In: 1917.3 [TPN:1917.3] Out: 550 [Urine:450; Stool:100] Intake/Output this shift:    Physical Exam: HEENT - sclerae clear, mucous membranes moist Neck - soft Abdomen - soft, obese; stoma viable with thin succus in bag, wound granulating well Ext - no edema, non-tender Neuro - alert & oriented, no focal deficits  Lab Results:   Recent Labs  10/14/15 0505  WBC 11.3*  HGB 8.0*  HCT 23.8*  PLT 687*   BMET  Recent Labs  10/13/15 0500 10/14/15 0505  NA 137 137  K 3.8 3.7  CL 102 101  CO2 26 27  GLUCOSE 99 110*  BUN 19 20  CREATININE 0.82 0.85  CALCIUM 8.6* 8.7*   PT/INR No results for input(s): LABPROT, INR in the last 72 hours. Comprehensive Metabolic Panel:    Component Value Date/Time   NA 137 10/14/2015 0505   NA 137 10/13/2015 0500   NA 139 07/31/2015 1001   K 3.7 10/14/2015 0505   K 3.8 10/13/2015 0500   CL 101 10/14/2015 0505   CL 102 10/13/2015 0500   CO2 27 10/14/2015 0505   CO2 26 10/13/2015 0500   BUN 20 10/14/2015 0505   BUN 19 10/13/2015 0500   BUN 20 07/31/2015 1001   CREATININE 0.85 10/14/2015 0505   CREATININE 0.82 10/13/2015 0500   GLUCOSE 110* 10/14/2015 0505   GLUCOSE 99 10/13/2015 0500   GLUCOSE 86 07/31/2015 1001   CALCIUM 8.7* 10/14/2015 0505   CALCIUM 8.6* 10/13/2015 0500   AST 18 10/14/2015 0505   AST 12* 10/10/2015 0500    ALT 15 10/14/2015 0505   ALT 12* 10/10/2015 0500   ALKPHOS 65 10/14/2015 0505   ALKPHOS 58 10/10/2015 0500   BILITOT 0.5 10/14/2015 0505   BILITOT 0.3 10/10/2015 0500   BILITOT 0.4 07/31/2015 1001   PROT 6.3* 10/14/2015 0505   PROT 5.3* 10/10/2015 0500   PROT 6.7 07/31/2015 1001   ALBUMIN 2.1* 10/14/2015 0505   ALBUMIN 2.1* 10/10/2015 0500   ALBUMIN 3.9 07/31/2015 1001    Studies/Results: No results found.  Anti-infectives: Anti-infectives    Start     Dose/Rate Route Frequency Ordered Stop   10/07/15 1700  [MAR Hold]  ertapenem (INVANZ) 1 g in sodium chloride 0.9 % 50 mL IVPB  Status:  Discontinued     (MAR Hold since 10/08/15 1224)   1 g 100 mL/hr over 30 Minutes Intravenous Every 24 hours 10/07/15 1643 10/08/15 1809      Assessment & Plans: Necrosis of ileostomy NPO, IVF, TNA, may have clears for comfort OOB, ambulate Vac changed today Benicar for HTN   Latasha Wood C. 51/06/9841

## 2015-10-15 LAB — GLUCOSE, CAPILLARY
Glucose-Capillary: 103 mg/dL — ABNORMAL HIGH (ref 65–99)
Glucose-Capillary: 139 mg/dL — ABNORMAL HIGH (ref 65–99)
Glucose-Capillary: 119 mg/dL — ABNORMAL HIGH (ref 65–99)

## 2015-10-15 LAB — BASIC METABOLIC PANEL
ANION GAP: 9 (ref 5–15)
BUN: 21 mg/dL — ABNORMAL HIGH (ref 6–20)
CO2: 25 mmol/L (ref 22–32)
Calcium: 8.7 mg/dL — ABNORMAL LOW (ref 8.9–10.3)
Chloride: 103 mmol/L (ref 101–111)
Creatinine, Ser: 0.95 mg/dL (ref 0.44–1.00)
GFR calc non Af Amer: >60 mL/min (ref >60)
GLUCOSE: 103 mg/dL — AB (ref 65–99)
POTASSIUM: 3.8 mmol/L (ref 3.5–5.1)
Sodium: 137 mmol/L (ref 135–145)

## 2015-10-15 LAB — MAGNESIUM: MAGNESIUM: 2.1 mg/dL (ref 1.7–2.4)

## 2015-10-15 LAB — PHOSPHORUS: Phosphorus: 4.7 mg/dL — ABNORMAL HIGH (ref 2.5–4.6)

## 2015-10-15 MED ORDER — FAT EMULSION 20 % IV EMUL
240.0000 mL | INTRAVENOUS | Status: AC
  Administered 2015-10-15: 240 mL via INTRAVENOUS
  Filled 2015-10-15: qty 250

## 2015-10-15 MED ORDER — TRACE MINERALS CR-CU-MN-SE-ZN 10-1000-500-60 MCG/ML IV SOLN
INTRAVENOUS | Status: AC
  Administered 2015-10-15: 18:00:00 via INTRAVENOUS
  Filled 2015-10-15: qty 1680

## 2015-10-15 NOTE — Progress Notes (Signed)
10/15/15 1600  Sitz bath completed

## 2015-10-15 NOTE — Progress Notes (Signed)
PARENTERAL NUTRITION CONSULT NOTE - FOLLOW-UP  Pharmacy Consult for TPN Indication: protein calorie malnutrition  No Known Allergies  Patient Measurements: Height: _0  (160 cm) Weight: 196 lb 3.4 oz (89 kg) IBW/kg (Calculated) : 52.4  Vital Signs: Temp: 99.1 F (37.3 C) (12/06 0542) Temp Source: Oral (12/06 0542) BP: 115/65 mmHg (12/06 0909) Pulse Rate: 91 (12/06 0909) Intake/Output from previous day: 12/05 0701 - 12/06 0700 In: 1910.7 [TPN:1910.7] Out: 1650 [Urine:1500; Stool:150] Intake/Output from this shift: Total I/O In: 10 [I.V.:10] Out: -   Labs:  Recent Labs  10/14/15 0505  WBC 11.3*  HGB 8.0*  HCT 23.8*  PLT 687*     Recent Labs  10/13/15 0500 10/14/15 0505 10/15/15 0500  NA 137 137 137  K 3.8 3.7 3.8  CL 102 101 103  CO2 _1 GLUCOSE 99 110* 103*  BUN 19 20 21*  CREATININE 0.82 0.85 0.95  CALCIUM 8.6* 8.7* 8.7*  MG 2.1 2.0 2.1  PHOS 4.2 4.7* 4.7*  PROT  --  6.3*  --   ALBUMIN  --  2.1*  --   AST  --  18  --   ALT  --  15  --   ALKPHOS  --  65  --   BILITOT  --  0.5  --   PREALBUMIN  --  11.2*  --   TRIG  --  166*  --    Estimated Creatinine Clearance: 66.6 mL/min (by C-G formula based on Cr of 0.95).    Recent Labs  10/14/15 1753 10/14/15 2352 10/15/15 0530  GLUCAP 124* 101* 103*    Medical History: Past Medical History  Diagnosis Date  . Hypertension   . Colitis, ulcerative ([place])   . Hyperlipidemia   . Colon polyps   . Blood transfusion   . Anemia   . Shortness of breath dyspnea     talking or walking   . Type 2 diabetes mellitus with complication, without long-term current use of insulin ([place]) 09/05/2015    currently on no medications   . Numbness and tingling     hands and feet bilat    Insulin Requirements:  4 units Novolog SSI yesterday  Current Nutrition: NPO  IVF: none  Central access: PICC line placed 10/09/15 TPN start date: 10/09/15   ASSESSMENT                                                                                                           HPI:  33 y/oF approximately 2 weeks s/p total abdominal proctectomy who presented to MD office 11/28 with frank necrosis of her ileostomy and was subsequently admitted to [facility]. Patient underwent ileostomy revision and repair of small bowel enterotomy on 11/29. Pharmacy asked to start TPN for this patient due to protein calorie malnutrition from recent surgery and chronic illness until her bowel function resolves.   Significant events:  11/30: Patient pulled NG tube out overnight 12/1: Having low grade fevers 12/2: Per surgery, anticipate TPN at least  through Monday 12/5 12/5:  continue TPN per surgery, may have clears for comfort  Today:    Glucose - CBGs at goal (< 150). History of type 2 DM, currently not on any medications (last Hemoglobin A1c 6.6%)  Electrolytes - all WNL phos slightly elevated, CCa WNL, Ca-Phos product less than 55  Renal - SCr  WNL, CrCl ~ 67 ml/min  LFTs - AST/ALT low, Alk Phos, Tbili WNL (12/5)  TGs - 82 (11/30), 166 (12/5)  Prealbumin - 15.9, 16.6 (11/30), 12/5: 11.2  NUTRITIONAL GOALS                                                                                             RD recs: Kcal: 1550-1750, Protein: 75-85g  Clinimix 5/15 at a goal rate of 70 ml/hr + 20% fat emulsion at 10 ml/hr to provide: 84 g/day protein, 1672 Kcal/day.  PLAN                                                                                                                          At 1800 today:  Continue Clinimix E 5/15 at 70 ml/hr (goal)  Continue 20% fat emulsion at 10 ml/hr.  TPN to contain standard multivitamins and trace elements.  Continue moderate SSI with q6h CBG checks.   TPN lab panels on Mondays & Thursdays.  BMET, Mag, Phos in AM.  F/u daily.   Royetta Asal, PharmD, BCPS Pager [phone] 10/15/2015 9:56 AM

## 2015-10-15 NOTE — Progress Notes (Signed)
Patient ID: Latasha Wood, female   DOB: 1955/11/18, 59 y.o.   MRN: [id number]  [facility]  POD#: 5  Subjective: Patient in bed, family at bedside.  Pain controlled with tylenol.  Having some ostomy output and some air in the bag  Objective: Vital signs in last 24 hours: Temp:  [97.7 F (36.5 C)-100 F (37.8 C)] 99.1 F (37.3 C) (12/06 0542) Pulse Rate:  [87-96] 96 (12/06 0542) Resp:  [15-16] 16 (12/06 0542) BP: (113-122)/(67-76) 122/76 mmHg (12/06 0542) SpO2:  [97 %-100 %] 99 % (12/06 0542) Last BM Date: 10/13/15  Intake/Output from previous day: 12/05 0701 - 12/06 0700 In: 1910.7 [TPN:1910.7] Out: 1650 [Urine:1500; Stool:150] Intake/Output this shift:    Physical Exam: HEENT - sclerae clear, mucous membranes moist Neck - soft Abdomen - soft, obese; stoma viable with thin succus in bag, wound granulating well Ext - no edema, non-tender Neuro - alert & oriented, no focal deficits  Lab Results:   Recent Labs  10/14/15 0505  WBC 11.3*  HGB 8.0*  HCT 23.8*  PLT 687*   BMET  Recent Labs  10/14/15 0505 10/15/15 0500  NA 137 137  K 3.7 3.8  CL 101 103  CO2 27 25  GLUCOSE 110* 103*  BUN 20 21*  CREATININE 0.85 0.95  CALCIUM 8.7* 8.7*   PT/INR No results for input(s): LABPROT, INR in the last 72 hours. Comprehensive Metabolic Panel:    Component Value Date/Time   NA 137 10/15/2015 0500   NA 137 10/14/2015 0505   NA 139 07/31/2015 1001   K 3.8 10/15/2015 0500   K 3.7 10/14/2015 0505   CL 103 10/15/2015 0500   CL 101 10/14/2015 0505   CO2 25 10/15/2015 0500   CO2 27 10/14/2015 0505   BUN 21* 10/15/2015 0500   BUN 20 10/14/2015 0505   BUN 20 07/31/2015 1001   CREATININE 0.95 10/15/2015 0500   CREATININE 0.85 10/14/2015 0505   GLUCOSE 103* 10/15/2015 0500   GLUCOSE 110* 10/14/2015 0505   GLUCOSE 86 07/31/2015 1001   CALCIUM 8.7* 10/15/2015 0500   CALCIUM 8.7* 10/14/2015 0505   AST 18 10/14/2015 0505   AST 12* 10/10/2015 0500   ALT 15 10/14/2015 0505   ALT 12* 10/10/2015 0500   ALKPHOS 65 10/14/2015 0505   ALKPHOS 58 10/10/2015 0500   BILITOT 0.5 10/14/2015 0505   BILITOT 0.3 10/10/2015 0500   BILITOT 0.4 07/31/2015 1001   PROT 6.3* 10/14/2015 0505   PROT 5.3* 10/10/2015 0500   PROT 6.7 07/31/2015 1001   ALBUMIN 2.1* 10/14/2015 0505   ALBUMIN 2.1* 10/10/2015 0500   ALBUMIN 3.9 07/31/2015 1001    Studies/Results: No results found.  Anti-infectives: Anti-infectives    Start     Dose/Rate Route Frequency Ordered Stop   10/07/15 1700  [MAR Hold]  ertapenem (INVANZ) 1 g in sodium chloride 0.9 % 50 mL IVPB  Status:  Discontinued     (MAR Hold since 10/08/15 1224)   1 g 100 mL/hr over 30 Minutes Intravenous Every 24 hours 10/07/15 1643 10/08/15 1809      Assessment & Plans: Necrosis of ileostomy Cont TPN, may have clears for comfort OOB, ambulate Vac change MWF Benicar for HTN Ambulate   Yahya Boldman C. 88/06/2799

## 2015-10-16 LAB — BASIC METABOLIC PANEL
ANION GAP: 7 (ref 5–15)
BUN: 22 mg/dL — ABNORMAL HIGH (ref 6–20)
CALCIUM: 9 mg/dL (ref 8.9–10.3)
CHLORIDE: 101 mmol/L (ref 101–111)
CO2: 29 mmol/L (ref 22–32)
CREATININE: 0.91 mg/dL (ref 0.44–1.00)
GFR calc non Af Amer: >60 mL/min (ref >60)
Glucose, Bld: 107 mg/dL — ABNORMAL HIGH (ref 65–99)
Potassium: 4 mmol/L (ref 3.5–5.1)
SODIUM: 137 mmol/L (ref 135–145)

## 2015-10-16 LAB — MAGNESIUM: MAGNESIUM: 2.2 mg/dL (ref 1.7–2.4)

## 2015-10-16 LAB — PHOSPHORUS: PHOSPHORUS: 4.7 mg/dL — AB (ref 2.5–4.6)

## 2015-10-16 LAB — GLUCOSE, CAPILLARY
GLUCOSE-CAPILLARY: 111 mg/dL — AB (ref 65–99)
Glucose-Capillary: 99 mg/dL (ref 65–99)
Glucose-Capillary: 143 mg/dL — ABNORMAL HIGH (ref 65–99)
Glucose-Capillary: 138 mg/dL — ABNORMAL HIGH (ref 65–99)

## 2015-10-16 MED ORDER — TRACE MINERALS CR-CU-MN-SE-ZN 10-1000-500-60 MCG/ML IV SOLN
INTRAVENOUS | Status: AC
  Administered 2015-10-16: 18:00:00 via INTRAVENOUS
  Filled 2015-10-16: qty 1680

## 2015-10-16 MED ORDER — FAT EMULSION 20 % IV EMUL
240.0000 mL | INTRAVENOUS | Status: AC
  Administered 2015-10-16: 240 mL via INTRAVENOUS
  Filled 2015-10-16: qty 250

## 2015-10-16 NOTE — Progress Notes (Signed)
Drainage at the right lower side of wound vac.  With mal odorous smell. Wound Vac is suctioning and it filling the vacuum.  The wound vac is not giving a leaking signal.  Since the wound is due to be dress this morning.  Nurse will monitor and keep dry till am.

## 2015-10-16 NOTE — Progress Notes (Signed)
PARENTERAL NUTRITION CONSULT NOTE - FOLLOW-UP  Pharmacy Consult for TPN Indication: protein calorie malnutrition  No Known Allergies  Patient Measurements: Height: 5' 3"  (160 cm) Weight: 196 lb 3.4 oz (89 kg) IBW/kg (Calculated) : 52.4  Vital Signs: Temp: 98.9 F (37.2 C) (12/07 0458) Temp Source: Oral (12/07 0458) BP: 109/63 mmHg (12/07 0458) Pulse Rate: 112 (12/07 0458) Intake/Output from previous day: 12/06 0701 - 12/07 0700 In: 1021.5 [I.V.:20; TPN:1001.5] Out: 4174 [Urine:1250; Stool:475] Intake/Output from this shift:    Labs:  Recent Labs  10/14/15 0505  WBC 11.3*  HGB 8.0*  HCT 23.8*  PLT 687*     Recent Labs  10/14/15 0505 10/15/15 0500 10/16/15 0505  NA 137 137 137  K 3.7 3.8 4.0  CL 101 103 101  CO2 27 25 29   GLUCOSE 110* 103* 107*  BUN 20 21* 22*  CREATININE 0.85 0.95 0.91  CALCIUM 8.7* 8.7* 9.0  MG 2.0 2.1 2.2  PHOS 4.7* 4.7* 4.7*  PROT 6.3*  --   --   ALBUMIN 2.1*  --   --   AST 18  --   --   ALT 15  --   --   ALKPHOS 65  --   --   BILITOT 0.5  --   --   PREALBUMIN 11.2*  --   --   TRIG 166*  --   --    Estimated Creatinine Clearance: 69.5 mL/min (by C-G formula based on Cr of 0.91).    Recent Labs  10/15/15 1803 10/15/15 2355 10/16/15 0449  GLUCAP 119* 111* 99    Medical History: Past Medical History  Diagnosis Date  . Hypertension   . Colitis, ulcerative ([place])   . Hyperlipidemia   . Colon polyps   . Blood transfusion   . Anemia   . Shortness of breath dyspnea     talking or walking   . Type 2 diabetes mellitus with complication, without long-term current use of insulin ([place]) 09/05/2015    currently on no medications   . Numbness and tingling     hands and feet bilat    Insulin Requirements:  2 units Novolog SSI yesterday  Current Nutrition: NPO  IVF: none  Central access: PICC line placed 10/09/15 TPN start date: 10/09/15   ASSESSMENT                                                                                                           HPI:  75 y/oF approximately 2 weeks s/p total abdominal proctectomy who presented to MD office 11/28 with frank necrosis of her ileostomy and was subsequently admitted to [facility]. Patient underwent ileostomy revision and repair of small bowel enterotomy on 11/29. Pharmacy asked to start TPN for this patient due to protein calorie malnutrition from recent surgery and chronic illness until her bowel function resolves.   Significant events:  11/30: Patient pulled NG tube out overnight 12/1: Having low grade fevers 12/2: Per surgery, anticipate TPN at least  through Monday 12/5 12/5: continue TPN per surgery, may  have clears for comfort 12/6: some ostomy output and some air in bag  Today, 10/16/2015  Glucose - CBGs at goal (< 150). History of type 2 DM, currently not on any medications (last Hemoglobin A1c 6.6%)  Electrolytes - all WNL phos slightly elevated, CCa WNL, Ca-Phos product less than 55  Renal - SCr  WNL  LFTs - AST/ALT low, Alk Phos, Tbili WNL (12/5)  TGs - 82 (11/30), 166 (12/5)  Prealbumin - 15.9, 16.6 (11/30), 12/5: 11.2  I/O: inaccurate intake but output - urine 1258m (down) and stool 4762m(up)  NUTRITIONAL GOALS                                                                                             RD recs: Kcal: 1550-1750, Protein: 75-85g  Clinimix 5/15 at a goal rate of 70 ml/hr + 20% fat emulsion at 10 ml/hr to provide: 84 g/day protein, 1672 Kcal/day.  PLAN                                                                                                                          At 1800 today:  Continue Clinimix E 5/15 at 70 ml/hr (goal)  Continue 20% fat emulsion at 10 ml/hr.  TPN to contain standard multivitamins and trace elements.  Continue moderate SSI with q6h CBG checks.   TPN lab panels on Mondays & Thursdays.   JuAdrian SaranPharmD, BCPS Pager [phone]/05/2015 9:29 AM

## 2015-10-16 NOTE — Clinical Documentation Improvement (Signed)
General Surgery  Abnormal Lab/Test Results:    Component     Latest Ref Rng 10/07/2015 10/08/2015 10/09/2015 10/10/2015             Hemoglobin     12.0 - 15.0 g/dL 8.3 (L) 7.5 (L) 7.5 (L) 6.2 (LL)  HCT     36.0 - 46.0 % 25.9 (L) 23.1 (L) 22.3 (L) 18.7 (L)   Component     Latest Ref Rng 10/11/2015 10/14/2015           Hemoglobin     12.0 - 15.0 g/dL 8.4 (L) 8.0 (L)  HCT     36.0 - 46.0 % 25.0 (L) 23.8 (L)    Possible Clinical Conditions associated with below indicators  Acute Blood Loss Anemia, including the suspected or known cause or associated condition(s)  Acute on chronic blood loss anemia, including the suspected or known cause or associated condition(s)  Chronic blood loss anemia, including the suspected or known cause or associated condition(s)  Precipitous drop in Hematocrit, including the suspected or known cause or associated condition(s)  Other  Clinically Undetermined    Supporting Information: History of Anemia 11/29 S/P Complex Ileostomy revision EBL = 661m  Treatment Provided: CBC every Monday Transfused 2 units PRBC 12/1  Please exercise your independent, professional judgment when responding. A specific answer is not anticipated or expected.   Thank You,  [phone]

## 2015-10-16 NOTE — Consult Note (Signed)
WOC wound follow up: Two attempts to achieve seal performed today, one at 10 am and the 2nd at 1pm. Both visits exceeded 90 minutes in length. I was assisted both times by the patient's bedside RN today,   Wound type: Surgical Measurement: Proximal wound measures 4.5cm x 1.5cm x 1.5cm and distal wound measures 4cm x 2cm x 2cm (significantly improved over measurements taken on 21/1/16) Wound bed: pink, moist Drainage (amount, consistency, odor) serous Periwound: Intact Dressing procedure/placement/frequency: Mepitel silicone non-adherent used in the wound bed prior to placement of black foam.  White foam is discontinued. Foam is covered with drape, bridged to connect proximal and distal wounds. With second attempt, seal is achieved. Patient medicated both times dressing were changed with 54m of MSO4, the first time medicated also with Ativan for anxiety.   WOC ostomy follow up Stoma type/location:  Midline, separates two, full thickness wounds. Old ileostomy site in the RUQ is now leaking thin, brown succus.  1028m at the time of this writing.  Dr. ThMarcello Mooresware. I have pouched both stomas today and labeled the old ileostomy site #2 and the new Ileostomy #1 for the purpose of monitoring output. Output from Old ileostomy site is with strong feculant odor. Stomal assessment/size: 1 and 1/8 inch round, 80% red, 20% yellow, moist but not functioning. Peristomal assessment: intact Treatment options for stomal/peristomal skin: Drape, skin barrier ring Output None from new ileostomy, thin brown succus from old ileostomy site. Ostomy pouching: 1pc convex with skin barrier ring Education provided: None.  Patient with husband and daughter at bedside. [place], previously.  Not since new supplies have been initiated as we are still not certain of pouching supplies needed to maintain a seal.  WOC nursing team will remain available to this patient, the  nursing, surgical and medical teams. Next pouching system change is due on Friday, 10/18/15. Thanks, LaMaudie FlakesMSN, RN, GNBlue RiverCWArther AbbottPager# [phone] time with patient today = 180 minutes (3 hours)

## 2015-10-17 LAB — COMPREHENSIVE METABOLIC PANEL
ALT: 15 U/L (ref 14–54)
AST: 16 U/L (ref 15–41)
Albumin: 2.2 g/dL — ABNORMAL LOW (ref 3.5–5.0)
Alkaline Phosphatase: 68 U/L (ref 38–126)
Anion gap: 8 (ref 5–15)
BUN: 23 mg/dL — ABNORMAL HIGH (ref 6–20)
CHLORIDE: 100 mmol/L — AB (ref 101–111)
CO2: 28 mmol/L (ref 22–32)
CREATININE: 0.88 mg/dL (ref 0.44–1.00)
Calcium: 9.1 mg/dL (ref 8.9–10.3)
GFR calc non Af Amer: >60 mL/min (ref >60)
Glucose, Bld: 105 mg/dL — ABNORMAL HIGH (ref 65–99)
Potassium: 3.9 mmol/L (ref 3.5–5.1)
SODIUM: 136 mmol/L (ref 135–145)
Total Bilirubin: 0.4 mg/dL (ref 0.3–1.2)
Total Protein: 6.9 g/dL (ref 6.5–8.1)

## 2015-10-17 LAB — CBC
HEMATOCRIT: 25.6 % — AB (ref 36.0–46.0)
HEMOGLOBIN: 8.1 g/dL — AB (ref 12.0–15.0)
MCH: 27.6 pg (ref 26.0–34.0)
MCHC: 31.6 g/dL (ref 30.0–36.0)
MCV: 87.1 fL (ref 78.0–100.0)
Platelets: 788 10*3/uL — ABNORMAL HIGH (ref 150–400)
RBC: 2.94 MIL/uL — ABNORMAL LOW (ref 3.87–5.11)
RDW: 13.4 % (ref 11.5–15.5)
WBC: 9.7 10*3/uL (ref 4.0–10.5)

## 2015-10-17 LAB — GLUCOSE, CAPILLARY
GLUCOSE-CAPILLARY: 124 mg/dL — AB (ref 65–99)
GLUCOSE-CAPILLARY: 123 mg/dL — AB (ref 65–99)
Glucose-Capillary: 113 mg/dL — ABNORMAL HIGH (ref 65–99)
Glucose-Capillary: 115 mg/dL — ABNORMAL HIGH (ref 65–99)

## 2015-10-17 LAB — MAGNESIUM: Magnesium: 2.1 mg/dL (ref 1.7–2.4)

## 2015-10-17 LAB — PHOSPHORUS: PHOSPHORUS: 4.6 mg/dL (ref 2.5–4.6)

## 2015-10-17 MED ORDER — TRACE MINERALS CR-CU-MN-SE-ZN 10-1000-500-60 MCG/ML IV SOLN
INTRAVENOUS | Status: AC
  Administered 2015-10-17: 17:00:00 via INTRAVENOUS
  Filled 2015-10-17: qty 1680

## 2015-10-17 MED ORDER — FAT EMULSION 20 % IV EMUL
240.0000 mL | INTRAVENOUS | Status: AC
  Administered 2015-10-17: 240 mL via INTRAVENOUS
  Filled 2015-10-17: qty 250

## 2015-10-17 NOTE — Progress Notes (Addendum)
Pt's ileostomy output was 25cc for the shift. Pts old ileostomy/wound site drained 335cc for the shift.  Will continue to monitor

## 2015-10-17 NOTE — Progress Notes (Signed)
Patient ID: Latasha Wood, female   DOB: 01-12-1956, 59 y.o.   MRN: [id number]  [facility]   Subjective: Patient in bed, family at bedside.  Pain better today.  Had quite a bit yesterday.  Having some ostomy output and some air in the bag, having some murky drainage out the old ileostomy site  Objective: Vital signs in last 24 hours: Temp:  [98.3 F (36.8 C)-98.7 F (37.1 C)] 98.4 F (36.9 C) (12/08 0614) Pulse Rate:  [87-108] 87 (12/08 0614) Resp:  [13-16] 13 (12/08 0614) BP: (98-112)/(64-70) 98/65 mmHg (12/08 0614) SpO2:  [94 %-100 %] 100 % (12/08 0614) Last BM Date: 10/13/15  Intake/Output from previous day: 12/07 0701 - 12/08 0700 In: 2001.3 [TPN:2001.3] Out: 2010 [Urine:1650; Stool:25] Intake/Output this shift:    Physical Exam: HEENT - sclerae clear, mucous membranes moist Neck - soft Abdomen - soft, obese; stoma viable with thin succus in bag, wound granulating well, reddish tinged fluid in other ostomy bag Ext - no edema, non-tender Neuro - alert & oriented, no focal deficits  Lab Results:   Recent Labs  10/17/15 0400  WBC 9.7  HGB 8.1*  HCT 25.6*  PLT 788*   BMET  Recent Labs  10/16/15 0505 10/17/15 0400  NA 137 136  K 4.0 3.9  CL 101 100*  CO2 29 28  GLUCOSE 107* 105*  BUN 22* 23*  CREATININE 0.91 0.88  CALCIUM 9.0 9.1   PT/INR No results for input(s): LABPROT, INR in the last 72 hours. Comprehensive Metabolic Panel:    Component Value Date/Time   NA 136 10/17/2015 0400   NA 137 10/16/2015 0505   NA 139 07/31/2015 1001   K 3.9 10/17/2015 0400   K 4.0 10/16/2015 0505   CL 100* 10/17/2015 0400   CL 101 10/16/2015 0505   CO2 28 10/17/2015 0400   CO2 29 10/16/2015 0505   BUN 23* 10/17/2015 0400   BUN 22* 10/16/2015 0505   BUN 20 07/31/2015 1001   CREATININE 0.88 10/17/2015 0400   CREATININE 0.91 10/16/2015 0505   GLUCOSE 105* 10/17/2015 0400   GLUCOSE 107* 10/16/2015 0505   GLUCOSE 86  07/31/2015 1001   CALCIUM 9.1 10/17/2015 0400   CALCIUM 9.0 10/16/2015 0505   AST 16 10/17/2015 0400   AST 18 10/14/2015 0505   ALT 15 10/17/2015 0400   ALT 15 10/14/2015 0505   ALKPHOS 68 10/17/2015 0400   ALKPHOS 65 10/14/2015 0505   BILITOT 0.4 10/17/2015 0400   BILITOT 0.5 10/14/2015 0505   BILITOT 0.4 07/31/2015 1001   PROT 6.9 10/17/2015 0400   PROT 6.3* 10/14/2015 0505   PROT 6.7 07/31/2015 1001   ALBUMIN 2.2* 10/17/2015 0400   ALBUMIN 2.1* 10/14/2015 0505   ALBUMIN 3.9 07/31/2015 1001    Studies/Results: No results found.  Anti-infectives: Anti-infectives    Start     Dose/Rate Route Frequency Ordered Stop   10/07/15 1700  [MAR Hold]  ertapenem (INVANZ) 1 g in sodium chloride 0.9 % 50 mL IVPB  Status:  Discontinued     (MAR Hold since 10/08/15 1224)   1 g 100 mL/hr over 30 Minutes Intravenous Every 24 hours 10/07/15 1643 10/08/15 1809      Assessment & Plans: Necrosis of ileostomy Cont TPN, may have clears for comfort, may be developing a fistula OOB, ambulate Vac change MWF Benicar for HTN Ambulate   Latasha Wood C. 94/05/961

## 2015-10-17 NOTE — Consult Note (Signed)
WOC wound consult note Reason for Consult: Continued support for ostomy, wound and possible fistula. Visit for support, voicing of concerns and answering of questions pertaining to NPWT, ostomy. NPWT and pouching changes are due tomorrow. Supplies at bedside. [facility] nursing team will remain available to this patient, the nursing, surgical and medical teams.   Thanks, Maudie Flakes, MSN, RN, [place], Arther Abbott  Pager# [phone]

## 2015-10-17 NOTE — Progress Notes (Signed)
PARENTERAL NUTRITION CONSULT NOTE - FOLLOW-UP  Pharmacy Consult for TPN Indication: protein calorie malnutrition  No Known Allergies  Patient Measurements: Height: 5' 3"  (160 cm) Weight: 196 lb 3.4 oz (89 kg) IBW/kg (Calculated) : 52.4  Vital Signs: Temp: 98.4 F (36.9 C) (12/08 0614) Temp Source: Oral (12/08 3419) BP: 120/73 mmHg (12/08 0957) Pulse Rate: 90 (12/08 0957) Intake/Output from previous day: 12/07 0701 - 12/08 0700 In: 2001.3 [TPN:2001.3] Out: 2010 [Urine:1650; Stool:25] Intake/Output from this shift: Total I/O In: 240 [P.O.:240] Out: 200 [Urine:200]  Labs:  Recent Labs  10/17/15 0400  WBC 9.7  HGB 8.1*  HCT 25.6*  PLT 788*     Recent Labs  10/15/15 0500 10/16/15 0505 10/17/15 0400  NA 137 137 136  K 3.8 4.0 3.9  CL 103 101 100*  CO2 25 29 28   GLUCOSE 103* 107* 105*  BUN 21* 22* 23*  CREATININE 0.95 0.91 0.88  CALCIUM 8.7* 9.0 9.1  MG 2.1 2.2 2.1  PHOS 4.7* 4.7* 4.6  PROT  --   --  6.9  ALBUMIN  --   --  2.2*  AST  --   --  16  ALT  --   --  15  ALKPHOS  --   --  68  BILITOT  --   --  0.4   Estimated Creatinine Clearance: 71.9 mL/min (by C-G formula based on Cr of 0.88).    Recent Labs  10/16/15 1759 10/16/15 2339 10/17/15 0607  GLUCAP 138* 113* 115*    Medical History: Past Medical History  Diagnosis Date  . Hypertension   . Colitis, ulcerative ([place])   . Hyperlipidemia   . Colon polyps   . Blood transfusion   . Anemia   . Shortness of breath dyspnea     talking or walking   . Type 2 diabetes mellitus with complication, without long-term current use of insulin ([place]) 09/05/2015    currently on no medications   . Numbness and tingling     hands and feet bilat    Insulin Requirements:  4 units Novolog SSI yesterday  Current Nutrition: NPO  IVF: none  Central access: PICC line placed 10/09/15 TPN start date: 10/09/15   ASSESSMENT                                                                                                           HPI:  54 y/oF approximately 2 weeks s/p total abdominal proctectomy who presented to MD office 11/28 with frank necrosis of her ileostomy and was subsequently admitted to [facility]. Patient underwent ileostomy revision and repair of small bowel enterotomy on 11/29. Pharmacy asked to start TPN for this patient due to protein calorie malnutrition from recent surgery and chronic illness until her bowel function resolves.   Significant events:  11/30: Patient pulled NG tube out overnight 12/1: Having low grade fevers 12/2: Per surgery, anticipate TPN at least  through Monday 12/5 12/5: continue TPN per surgery, may have clears for comfort 12/6: some ostomy output and some air in bag  12/8: 25cc in ostomy bag.  Likely new fistula developing.  Tolerating clears decently well  Today, 10/17/2015  Glucose - CBGs at goal (< 150). History of type 2 DM, currently not on any medications (last Hemoglobin A1c 6.6%)  Electrolytes - all WNL; CCa borderline high  Renal - SCr stable WNL; CrCl 72 CG; UOP adequate  LFTs -wnl  TGs - 82 (11/30), 166 (12/5)  Prealbumin - decreased and low as of 12/5  NUTRITIONAL GOALS                                                                                             RD recs: Kcal: 1550-1750, Protein: 75-85g  Clinimix 5/15 at a goal rate of 70 ml/hr + 20% fat emulsion at 10 ml/hr to provide: 84 g/day protein, 1672 Kcal/day.  PLAN                                                                                                                          At 1800 today:  Continue Clinimix E 5/15 at 70 ml/hr (goal)  Continue 20% fat emulsion at 10 ml/hr.  TPN to contain standard multivitamins and trace elements.  Continue moderate SSI with q6h CBG checks.   TPN lab panels on Mondays & Thursdays  No plans to advance diet with interval development of fistula; Surgery thinks may need TPN several more weeks while fistula resolves.   Reuel Boom,  PharmD, BCPS Pager: [phone] 10/17/2015, 10:18 AM

## 2015-10-17 NOTE — Evaluation (Signed)
Physical Therapy Evaluation Patient Details Name: CINZIA DEVOS MRN: [id number] DOB: [birth year] Today's Date: 10/17/2015   History of Present Illness  58 year old female status post total proctocolectomy with small bowel resection admitted for necrosis of ileostomy.  PMHx of ulcerative colitis, HTN, hyperlipidemia, SBO, anxiety  Clinical Impression  Pt admitted with above diagnosis. Pt currently with functional limitations due to the deficits listed below (see PT Problem List).  Pt will benefit from skilled PT to increase their independence and safety with mobility to allow discharge to the venue listed below.  Pt mobilizing well and plans to d/c home with spouse.  Spouse will be working so patient wishes to regain PLOF quickly.     Follow Up Recommendations No PT follow up    Equipment Recommendations  None recommended by PT    Recommendations for Other Services       Precautions / Restrictions Precautions Precaution Comments: ileostomy, wound vac      Mobility  Bed Mobility               General bed mobility comments: pt up in bathroom on arrival  Transfers Overall transfer level: Modified independent                  Ambulation/Gait Ambulation/Gait assistance: Supervision Ambulation Distance (Feet): 400 Feet Assistive device: None Gait Pattern/deviations: Step-through pattern Gait velocity: decreased   General Gait Details: pt hold hands to abdominal area, no UE support/assist required, a few standing rest breaks  Stairs            Wheelchair Mobility    Modified Rankin (Stroke Patients Only)       Balance                                             Pertinent Vitals/Pain Pain Assessment: 0-10 Pain Score: 4  Pain Location: abdomen Pain Descriptors / Indicators: Sore;Grimacing;Guarding Pain Intervention(s): Limited activity within patient's tolerance;Monitored during session    Home Living Family/patient  expects to be discharged to:: Private residence Living Arrangements: Spouse/significant other Available Help at Discharge: Family Type of Home: House Home Access: Stairs to enter   Technical brewer of Steps: 1 and 3 Home Layout: Able to live on main level with bedroom/bathroom;Two level Home Equipment: None      Prior Function Level of Independence: Independent               Hand Dominance        Extremity/Trunk Assessment   Upper Extremity Assessment: Overall WFL for tasks assessed           Lower Extremity Assessment: Overall WFL for tasks assessed      Cervical / Trunk Assessment: Normal  Communication   Communication: No difficulties  Cognition Arousal/Alertness: Awake/alert Behavior During Therapy: WFL for tasks assessed/performed Overall Cognitive Status: Within Functional Limits for tasks assessed                      General Comments      Exercises General Exercises - Lower Extremity Quad Sets: AROM;Both;15 reps;Seated Long Arc Quad: AROM;Both;15 reps;Seated Hip ABduction/ADduction: AROM;Seated;Both;15 reps (isometric adduction) Hip Flexion/Marching: AROM;Both;15 reps;Seated      Assessment/Plan    PT Assessment Patient needs continued PT services  PT Diagnosis Difficulty walking   PT Problem List Decreased strength;Decreased activity tolerance;Decreased mobility;Pain  PT  Treatment Interventions Gait training;Functional mobility training;Patient/family education;Therapeutic activities;Therapeutic exercise;Stair training   PT Goals (Current goals can be found in the Care Plan section) Acute Rehab PT Goals Patient Stated Goal: increase mobility, maintain strength PT Goal Formulation: With patient Time For Goal Achievement: 10/24/15 Potential to Achieve Goals: Good    Frequency Min 3X/week   Barriers to discharge        Co-evaluation               End of Session Equipment Utilized During Treatment: Gait  belt Activity Tolerance: Patient tolerated treatment well Patient left: in bed;with call bell/phone within reach;with nursing/sitter in room           Time: 1002-1026 PT Time Calculation (min) (ACUTE ONLY): 24 min   Charges:   PT Evaluation $Initial PT Evaluation Tier I: 1 Procedure     PT G Codes:        Yarelin Reichardt,KATHrine E 10/17/2015, 10:57 AM Carmelia Bake, PT, DPT 10/17/2015 Pager: [phone]

## 2015-10-18 ENCOUNTER — Encounter (HOSPITAL_COMMUNITY): Payer: 59

## 2015-10-18 LAB — GLUCOSE, CAPILLARY
GLUCOSE-CAPILLARY: 122 mg/dL — AB (ref 65–99)
GLUCOSE-CAPILLARY: 138 mg/dL — AB (ref 65–99)
Glucose-Capillary: 106 mg/dL — ABNORMAL HIGH (ref 65–99)
Glucose-Capillary: 115 mg/dL — ABNORMAL HIGH (ref 65–99)

## 2015-10-18 MED ORDER — FAT EMULSION 20 % IV EMUL
234.0000 mL | INTRAVENOUS | Status: AC
  Administered 2015-10-18: 234 mL via INTRAVENOUS
  Filled 2015-10-18: qty 250

## 2015-10-18 MED ORDER — INSULIN ASPART 100 UNIT/ML ~~LOC~~ SOLN
0.0000 [IU] | SUBCUTANEOUS | Status: DC
  Administered 2015-10-18 – 2015-10-20 (×4): 2 [IU] via SUBCUTANEOUS

## 2015-10-18 MED ORDER — TRACE MINERALS CR-CU-MN-SE-ZN 10-1000-500-60 MCG/ML IV SOLN
INTRAVENOUS | Status: AC
  Administered 2015-10-18: 17:00:00 via INTRAVENOUS
  Filled 2015-10-18: qty 1700

## 2015-10-18 NOTE — Progress Notes (Signed)
Nutrition Follow-up  DOCUMENTATION CODES:   Obesity unspecified  INTERVENTION:  - Continue TPN and transition to cyclic TPN per pharmacy - RD will continue to monitor for needs  NUTRITION DIAGNOSIS:   Inadequate oral intake related to inability to eat, altered GI function as evidenced by NPO status. -ongoing  GOAL:   Patient will meet greater than or equal to 90% of their needs -will be met with TPN regimen  MONITOR:   Labs, Weight trends, Skin, I & O's, Other (Comment) (TPN)  ASSESSMENT:   59 year old female presenting for a post-operative visit. 59 year old female status post total proctocolectomy with small bowel resection who presents to the office with worsening pain around her ostomy site.   PT was transitioned from NPO to CLD 12/6 @ 0903 and then made NPO again today at 0909. Per chart, pt consumed 25% of breakfast yesterday and no other intakes documented since diet advancement. Pt reports she had black coffee this AM prior to new NPO order. She denies abdominal pain or nausea at this time and states that nausea throughout admission has mainly been related to anxiety and pain medications.  At time of RD visit, pt receiving Clinimix E 5/15 @ 70 mL/hr with 20% lipids @ 29m/hr. This regimen provides 1673 kcal and 84 grams of protein which meets 100% estimated kcal and protein needs.   Per surgery note this AM, pt with fistula and plan to d/c home with cyclic TPN. Pharmacy to begin cycling today in preparation for transition to home.  Per pharmacy note this AM: At 1800 today:  Start 119Jcyclic TPN: 547WG/NFx 1 hr (1800-1900), 100cc/hr x 16 hours (1900-1100), then 50cc/hr (1100-1200)  Lipid infusion at 13 ml/hr x 18 hours (1800-1200)  TPN to contain standard multivitamins and trace elements.  Check CBGs 2 hours past cyclic TPN start, 1 hr past cyclic TPN d/c, middle of TPN infusion, and while off TPN (total 4 CBGs/24h period)  TPN lab panels on Mondays &  Thursdays  Check CMET in AM  RD will continue to monitor for needs. Medications reviewed. Labs reviewed; CBGs: 99 mmol/L, Cl: 100 mmol/L, BUN elevated.    Diet Order:  TPN (CLINIMIX-E) Adult Diet NPO time specified Except for: Sips with Meds, Ice Chips TPN (CLINIMIX-E) Adult  Skin:  Wound (see comment) (abdominal incision)  Last BM:  12/8  Height:   Ht Readings from Last 1 Encounters:  10/07/15 5' 3"  (1.6 m)    Weight:   Wt Readings from Last 1 Encounters:  10/07/15 196 lb 3.4 oz (89 kg)    Ideal Body Weight:  52.3 kg  BMI:  Body mass index is 34.77 kg/(m^2).  Estimated Nutritional Needs:   Kcal:  1550-1750  Protein:  75-85g  Fluid:  1.7L/day  EDUCATION NEEDS:   No education needs identified at this time     JJarome Matin RD, LDN Inpatient Clinical Dietitian Pager # [phone] hours/weekend pager # [phone]

## 2015-10-18 NOTE — Progress Notes (Signed)
PARENTERAL NUTRITION CONSULT NOTE - FOLLOW-UP  Pharmacy Consult for TPN Indication: protein calorie malnutrition  No Known Allergies  Patient Measurements: Height: 5' 3"  (160 cm) Weight: 196 lb 3.4 oz (89 kg) IBW/kg (Calculated) : 52.4  Vital Signs: Temp: 98.1 F (36.7 C) (12/09 0625) Temp Source: Oral (12/09 0625) BP: 113/70 mmHg (12/09 0625) Pulse Rate: 92 (12/09 0625) Intake/Output from previous day: 12/08 0701 - 12/09 0700 In: 2241.2 [P.O.:240; TPN:2001.2] Out: 2200 [Urine:1525; Drains:225] Intake/Output from this shift:    Labs:  Recent Labs  10/17/15 0400  WBC 9.7  HGB 8.1*  HCT 25.6*  PLT 788*     Recent Labs  10/16/15 0505 10/17/15 0400  NA 137 136  K 4.0 3.9  CL 101 100*  CO2 29 28  GLUCOSE 107* 105*  BUN 22* 23*  CREATININE 0.91 0.88  CALCIUM 9.0 9.1  MG 2.2 2.1  PHOS 4.7* 4.6  PROT  --  6.9  ALBUMIN  --  2.2*  AST  --  16  ALT  --  15  ALKPHOS  --  68  BILITOT  --  0.4   Estimated Creatinine Clearance: 71.9 mL/min (by C-G formula based on Cr of 0.88).    Recent Labs  10/17/15 1755 10/18/15 0009 10/18/15 0618  GLUCAP 123* 122* 106*    Medical History: Past Medical History  Diagnosis Date  . Hypertension   . Colitis, ulcerative ([place])   . Hyperlipidemia   . Colon polyps   . Blood transfusion   . Anemia   . Shortness of breath dyspnea     talking or walking   . Type 2 diabetes mellitus with complication, without long-term current use of insulin ([place]) 09/05/2015    currently on no medications   . Numbness and tingling     hands and feet bilat    Insulin Requirements:  6 units Novolog SSI in last 24h  Current Nutrition: NPO  IVF: none  Central access: PICC line placed 10/09/15 TPN start date: 10/09/15   ASSESSMENT                                                                                                          HPI:  70 y/oF with hx ulcerative colitis s/p total abdominal proctectomy 09/25/15 presented to MD  office 11/28 with frank necrosis of her ileostomy and was subsequently admitted to [facility]. Patient underwent ileostomy revision and repair of small bowel enterotomy on 11/29. Pharmacy asked to start TPN for this patient due to protein calorie malnutrition from recent surgery and chronic illness until her bowel function resolves.   Significant events:  11/30: Patient pulled NG tube out overnight 12/1: Having low grade fevers 12/2: Per surgery, anticipate TPN at least  through Monday 12/5 12/5: continue TPN per surgery, may have clears for comfort 12/6: some ostomy output and some air in bag 12/8: 25cc in ostomy bag.  Likely new fistula developing.  Tolerating clears decently well 41/3: Start cyclic TPN  Today, 24/40/1027  Glucose - CBGs at goal (< 150). History of  type 2 DM, currently not on any medications (last Hemoglobin A1c 6.6%)  Electrolytes - all WNL; CCa borderline high  Renal - SCr stable WNL; CrCl 72 CG; UOP adequate  LFTs -wnl  TGs - 82 (11/30), 166 (12/5)  Prealbumin - decreased and low as of 12/5  NUTRITIONAL GOALS                                                                                    RD recs: 1550-1750 Kcal/day, 75-85g Protein/day, 1.7L fluid/day  Clinimix 5/15 at a goal rate of 70 ml/hr + 20% fat emulsion at 10 ml/hr to provide: 84 g/day protein, 1672 Kcal/day.  PLAN                                                                                                                  At 1800 today:  Start 17R cyclic TPN: 11AF/BX x 1 hr (1800-1900), 100cc/hr x 16 hours (1900-1100), then 50cc/hr (1100-1200)  Lipid infusion at 13 ml/hr x 18 hours (1800-1200)  TPN to contain standard multivitamins and trace elements.  Check CBGs 2 hours past cyclic TPN start, 1 hr past cyclic TPN d/c, middle of TPN infusion, and while off TPN (total 4 CBGs/24h period)  TPN lab panels on Mondays & Thursdays  Check CMET in AM  Ralene Bathe, PharmD, BCPS 10/18/2015, 11:03  AM  Pager: [phone]

## 2015-10-18 NOTE — Progress Notes (Signed)
Patient ID: Latasha Wood, female   DOB: 01-21-1956, 59 y.o.   MRN: [id number]  [facility]   Subjective: Patient in bed, family at bedside.  Pain better today. Energy better.  482m out the old ostomy site  Objective: Vital signs in last 24 hours: Temp:  [98.1 F (36.7 C)-99.1 F (37.3 C)] 98.1 F (36.7 C) (12/09 0625) Pulse Rate:  [77-94] 92 (12/09 0625) Resp:  [13-16] 13 (12/09 0625) BP: (113-127)/(67-73) 113/70 mmHg (12/09 0625) SpO2:  [95 %-100 %] 100 % (12/09 0625) Last BM Date: 10/17/15  Intake/Output from previous day: 12/08 0701 - 12/09 0700 In: 2241.2 [P.O.:240; TPN:2001.2] Out: 2200 [Urine:1525; Drains:225] Intake/Output this shift:    Physical Exam: HEENT - sclerae clear, mucous membranes moist Neck - soft Abdomen - soft, obese; stoma viable with thin succus in bag, wound granulating well, reddish tinged fluid in other ostomy bag Ext - no edema, non-tender Neuro - alert & oriented, no focal deficits  Lab Results:   Recent Labs  10/17/15 0400  WBC 9.7  HGB 8.1*  HCT 25.6*  PLT 788*   BMET  Recent Labs  10/16/15 0505 10/17/15 0400  NA 137 136  K 4.0 3.9  CL 101 100*  CO2 29 28  GLUCOSE 107* 105*  BUN 22* 23*  CREATININE 0.91 0.88  CALCIUM 9.0 9.1   PT/INR No results for input(s): LABPROT, INR in the last 72 hours. Comprehensive Metabolic Panel:    Component Value Date/Time   NA 136 10/17/2015 0400   NA 137 10/16/2015 0505   NA 139 07/31/2015 1001   K 3.9 10/17/2015 0400   K 4.0 10/16/2015 0505   CL 100* 10/17/2015 0400   CL 101 10/16/2015 0505   CO2 28 10/17/2015 0400   CO2 29 10/16/2015 0505   BUN 23* 10/17/2015 0400   BUN 22* 10/16/2015 0505   BUN 20 07/31/2015 1001   CREATININE 0.88 10/17/2015 0400   CREATININE 0.91 10/16/2015 0505   GLUCOSE 105* 10/17/2015 0400   GLUCOSE 107* 10/16/2015 0505   GLUCOSE 86 07/31/2015 1001   CALCIUM 9.1 10/17/2015 0400   CALCIUM 9.0 10/16/2015 0505   AST 16 10/17/2015 0400   AST 18 10/14/2015 0505   ALT 15 10/17/2015 0400   ALT 15 10/14/2015 0505   ALKPHOS 68 10/17/2015 0400   ALKPHOS 65 10/14/2015 0505   BILITOT 0.4 10/17/2015 0400   BILITOT 0.5 10/14/2015 0505   BILITOT 0.4 07/31/2015 1001   PROT 6.9 10/17/2015 0400   PROT 6.3* 10/14/2015 0505   PROT 6.7 07/31/2015 1001   ALBUMIN 2.2* 10/17/2015 0400   ALBUMIN 2.1* 10/14/2015 0505   ALBUMIN 3.9 07/31/2015 1001    Studies/Results: No results found.  Anti-infectives: Anti-infectives    Start     Dose/Rate Route Frequency Ordered Stop   10/07/15 1700  [MAR Hold]  ertapenem (INVANZ) 1 g in sodium chloride 0.9 % 50 mL IVPB  Status:  Discontinued     (MAR Hold since 10/08/15 1224)   1 g 100 mL/hr over 30 Minutes Intravenous Every 24 hours 10/07/15 1643 10/08/15 1809      Assessment & Plans: Necrosis of ileostomy Cont TPN, looks like she has a fistula.  Will return to NPO and cycle TPN for home use OOB, ambulate Vac change MWF Benicar for HTN Ambulate   Tanay Massiah C. 198/01/3824

## 2015-10-18 NOTE — Consult Note (Addendum)
WOC wound follow up Wound type: Surgical, midline with stoma (ileostomy) in center  Measurement: Per Wednesday's measurements Wound RKY:HCWC, moist Drainage (amount, consistency, odor) Serous Periwound:itnact Dressing procedure/placement/frequency: NPWT and silicone wound contact layer removed and wound and surrounding skin cleansed. Mepitel wound contact layer (2 pieces) used to cover proximal and distal portions of midline wound.  Black foam (2 pieces) placed on top of silicone.  Single piece of foam cut into a "V" formation used to bridge proximal and distal wounds.  Skin barrier ring placed around ileostomy and 1-piece convex pouch applied.  System attached to 15mHg negative pressure and an immediate seal is achieved.  WOC wound follow up: #2 Pouch Wound type:Fistula, enterocutaneous, RUQ at old ileostomy site Measurement: 7/8 inch x 1 and 1/8 inch oval Wound bed:red, mosit Drainage (amount, consistency, odor) red/brown effluent Periwound:intact Dressing procedure/placement/frequency: pouch with 1-piece convex ostomy pouching system and portion of skin barrier ring placed at 3 o'clock  WOC ostomy follow up: #1 pouch Stoma type/location: Midline, separates proximal and distal wounds Stomal assessment/size: 1 and 1/8 inch round, 90% red, 10% yellow, moist, not functioning.  Peristomal assessment: Full thickness surgical wounds proximally and distally Treatment options for stomal/peristomal skin: skin barrier ring Output: scant amount of brown succus Ostomy pouching: 1pc. Convex fecal pouching system  Education provided: Patient, husband participating in session along with a Bedside RN with [facility]. [place] in [place]: Yes, previously.  Not since second surgery.  WStar Lakenursing team  will remain available to this patient, the nursing, surgical and medical teams.  Dressing changes are three times weekly, currently M/W/F.  Next scheduled  dressing change is Monday, 10/21/15. Thanks, LMaudie Flakes MSN, RN, [place] CArther Abbott Pager# [phone] Time spent on this encounter = 75 minutes (60 in Direct Patient Care)

## 2015-10-19 LAB — COMPREHENSIVE METABOLIC PANEL
ALK PHOS: 99 U/L (ref 38–126)
ALT: 33 U/L (ref 14–54)
AST: 32 U/L (ref 15–41)
Albumin: 2.4 g/dL — ABNORMAL LOW (ref 3.5–5.0)
Anion gap: 9 (ref 5–15)
BILIRUBIN TOTAL: 0.2 mg/dL — AB (ref 0.3–1.2)
BUN: 30 mg/dL — ABNORMAL HIGH (ref 6–20)
CALCIUM: 8.7 mg/dL — AB (ref 8.9–10.3)
CO2: 26 mmol/L (ref 22–32)
CREATININE: 0.91 mg/dL (ref 0.44–1.00)
Chloride: 95 mmol/L — ABNORMAL LOW (ref 101–111)
Glucose, Bld: 121 mg/dL — ABNORMAL HIGH (ref 65–99)
Potassium: 3.7 mmol/L (ref 3.5–5.1)
Sodium: 130 mmol/L — ABNORMAL LOW (ref 135–145)
Total Protein: 7 g/dL (ref 6.5–8.1)

## 2015-10-19 LAB — GLUCOSE, CAPILLARY: GLUCOSE-CAPILLARY: 86 mg/dL (ref 65–99)

## 2015-10-19 MED ORDER — TRACE MINERALS CR-CU-MN-SE-ZN 10-1000-500-60 MCG/ML IV SOLN
INTRAVENOUS | Status: AC
  Administered 2015-10-19: 18:00:00 via INTRAVENOUS
  Filled 2015-10-19: qty 1700

## 2015-10-19 MED ORDER — FAT EMULSION 20 % IV EMUL
234.0000 mL | INTRAVENOUS | Status: AC
  Administered 2015-10-19: 234 mL via INTRAVENOUS
  Filled 2015-10-19: qty 250

## 2015-10-19 NOTE — Progress Notes (Signed)
Patient BP meds held.  BP 98/60, reevaluate at 1400 BP for adminstration

## 2015-10-19 NOTE — Progress Notes (Signed)
Patient ID: Latasha Wood, female   DOB: 1955/12/18, 59 y.o.   MRN: [id number]  [facility]   Subjective: Patient in bed, family at bedside.  Pain better today. Energy better.  446m out the old ostomy site  Objective: Vital signs in last 24 hours: Temp:  [97.9 F (36.6 C)-98.5 F (36.9 C)] 98.4 F (36.9 C) (12/10 0541) Pulse Rate:  [66-98] 96 (12/10 0541) Resp:  [14-16] 16 (12/10 0541) BP: (104-127)/(63-76) 104/63 mmHg (12/10 0541) SpO2:  [98 %-100 %] 98 % (12/10 0541) Last BM Date: 10/19/15  Intake/Output from previous day: 12/09 0701 - 12/10 0700 In: 528[TPN:560] Out: 2025 [Urine:1750; Stool:275] Intake/Output this shift: Total I/O In: -  Out: 225 [Urine:150; Stool:75]  Physical Exam: HEENT - sclerae clear, mucous membranes moist Neck - soft Abdomen - soft, obese; stoma viable with thin succus in bag, wound granulating well, reddish tinged fluid in other ostomy bag Ext - no edema, non-tender Neuro - alert & oriented, no focal deficits  Lab Results:   Recent Labs  10/17/15 0400  WBC 9.7  HGB 8.1*  HCT 25.6*  PLT 788*   BMET  Recent Labs  10/17/15 0400 10/19/15 0644  NA 136 130*  K 3.9 3.7  CL 100* 95*  CO2 28 26  GLUCOSE 105* 121*  BUN 23* 30*  CREATININE 0.88 0.91  CALCIUM 9.1 8.7*   PT/INR No results for input(s): LABPROT, INR in the last 72 hours. Comprehensive Metabolic Panel:    Component Value Date/Time   NA 130* 10/19/2015 0644   NA 136 10/17/2015 0400   NA 139 07/31/2015 1001   K 3.7 10/19/2015 0644   K 3.9 10/17/2015 0400   CL 95* 10/19/2015 0644   CL 100* 10/17/2015 0400   CO2 26 10/19/2015 0644   CO2 28 10/17/2015 0400   BUN 30* 10/19/2015 0644   BUN 23* 10/17/2015 0400   BUN 20 07/31/2015 1001   CREATININE 0.91 10/19/2015 0644   CREATININE 0.88 10/17/2015 0400   GLUCOSE 121* 10/19/2015 0644   GLUCOSE 105* 10/17/2015 0400   GLUCOSE 86 07/31/2015 1001   CALCIUM 8.7* 10/19/2015 0644    CALCIUM 9.1 10/17/2015 0400   AST 32 10/19/2015 0644   AST 16 10/17/2015 0400   ALT 33 10/19/2015 0644   ALT 15 10/17/2015 0400   ALKPHOS 99 10/19/2015 0644   ALKPHOS 68 10/17/2015 0400   BILITOT 0.2* 10/19/2015 0644   BILITOT 0.4 10/17/2015 0400   BILITOT 0.4 07/31/2015 1001   PROT 7.0 10/19/2015 0644   PROT 6.9 10/17/2015 0400   PROT 6.7 07/31/2015 1001   ALBUMIN 2.4* 10/19/2015 0644   ALBUMIN 2.2* 10/17/2015 0400   ALBUMIN 3.9 07/31/2015 1001    Studies/Results: No results found.  Anti-infectives: Anti-infectives    Start     Dose/Rate Route Frequency Ordered Stop   10/07/15 1700  [MAR Hold]  ertapenem (INVANZ) 1 g in sodium chloride 0.9 % 50 mL IVPB  Status:  Discontinued     (MAR Hold since 10/08/15 1224)   1 g 100 mL/hr over 30 Minutes Intravenous Every 24 hours 10/07/15 1643 10/08/15 1809      Assessment & Plans: Necrosis of ileostomy Cont TPN, looks like she has a fistula. Output decreased with NPO. Cycle TPN for home use.  Hopefully home early next week  D/C prednisone OOB, ambulate Vac change MWF Benicar for HTN Ambulate   [name] C. [id number]/94/[id number]

## 2015-10-19 NOTE — Progress Notes (Signed)
PARENTERAL NUTRITION CONSULT NOTE - FOLLOW-UP  Pharmacy Consult for TPN Indication: protein calorie malnutrition  No Known Allergies  Patient Measurements: Height: 5' 3"  (160 cm) Weight: 196 lb 3.4 oz (89 kg) IBW/kg (Calculated) : 52.4  Vital Signs: Temp: 98.4 F (36.9 C) (12/10 0541) Temp Source: Oral (12/10 0541) BP: 104/63 mmHg (12/10 0541) Pulse Rate: 96 (12/10 0541) Intake/Output from previous day: 12/09 0701 - 12/10 0700 In: 560 [TPN:560] Out: 2025 [Urine:1750; Stool:275] Intake/Output from this shift: Total I/O In: -  Out: 225 [Urine:150; Stool:75]  Labs:  Recent Labs  10/17/15 0400  WBC 9.7  HGB 8.1*  HCT 25.6*  PLT 788*    Recent Labs  10/17/15 0400 10/19/15 0644  NA 136 130*  K 3.9 3.7  CL 100* 95*  CO2 28 26  GLUCOSE 105* 121*  BUN 23* 30*  CREATININE 0.88 0.91  CALCIUM 9.1 8.7*  MG 2.1  --   PHOS 4.6  --   PROT 6.9 7.0  ALBUMIN 2.2* 2.4*  AST 16 32  ALT 15 33  ALKPHOS 68 99  BILITOT 0.4 0.2*   Estimated Creatinine Clearance: 69.5 mL/min (by C-G formula based on Cr of 0.91).    Recent Labs  10/18/15 1156 10/18/15 2031 10/19/15 0318  GLUCAP 138* 115* 86   Medical History: Past Medical History  Diagnosis Date  . Hypertension   . Colitis, ulcerative ([place])   . Hyperlipidemia   . Colon polyps   . Blood transfusion   . Anemia   . Shortness of breath dyspnea     talking or walking   . Type 2 diabetes mellitus with complication, without long-term current use of insulin ([place]) 09/05/2015    currently on no medications   . Numbness and tingling     hands and feet bilat    Insulin Requirements:  2 units Novolog SSI in last 24h  Current Nutrition: NPO  IVF: none  Central access: PICC line placed 10/09/15 TPN start date: 10/09/15   ASSESSMENT                                                                                                          HPI:  83 y/oF with hx ulcerative colitis s/p total abdominal proctectomy  09/25/15 presented to MD office 11/28 with frank necrosis of her ileostomy and was subsequently admitted to [facility] Patient underwent ileostomy revision and repair of small bowel enterotomy on 11/29. Pharmacy asked to start TPN for this patient due to protein calorie malnutrition from recent surgery and chronic illness until her bowel function resolves.   Significant events:  11/30: Patient pulled NG tube out overnight 12/1: Having low grade fevers 12/2: Per surgery, anticipate TPN at least  through Monday 12/5 12/5: continue TPN per surgery, may have clears for comfort 12/6: some ostomy output and some air in bag 12/8: 25cc in ostomy bag.  Likely new fistula developing.  Tolerating clears decently well 53/2: Start cyclic TPN 99/24: Prednisone discontinued, received this am  Today, 10/19/2015  Glucose - CBGs at goal (< 150).  History of type 2 DM, currently not on any medications (last Hemoglobin A1c 6.6%)  Electrolytes - Na low this am, Corr Ca wnl  Renal - SCr stable WNL; CrCl 72 CG; UOP adequate  LFTs -wnl  TGs - 82 (11/30), 166 (12/5)  Prealbumin - decreased and low as of 12/5  NUTRITIONAL GOALS                                                                                    RD recs: 1550-1750 Kcal/day, 75-85g Protein/day, 1.7L fluid/day  Clinimix 5/15 at a goal rate of 70 ml/hr + 20% fat emulsion at 10 ml/hr to provide: 84 g/day protein, 1672 Kcal/day.  Cyclic Clinimix E 3/42 over 18 hr delivers 84 gm protein, 1670 Kcal/day  PLAN                                                                                                                  At 1800 today:  Continue 87G cyclic TPN: 81LX/BW x 1 hr (1800-1900), 100cc/hr x 16 hours (1900-1100), then 50cc/hr (1100-1200)  Lipid infusion at 13 ml/hr x 18 hours (1800-1200)  TPN to contain standard multivitamins and trace elements.  Check CBGs 2 hours past cyclic TPN start, 1 hr past cyclic TPN d/c, middle of TPN infusion, and  while off TPN (total 4 CBGs/24h period)  TPN lab panels on Mondays & Thursdays  Minda Ditto PharmD Pager [phone] 10/19/2015, 9:32 AM

## 2015-10-20 LAB — GLUCOSE, CAPILLARY
GLUCOSE-CAPILLARY: 122 mg/dL — AB (ref 65–99)
GLUCOSE-CAPILLARY: 94 mg/dL (ref 65–99)
Glucose-Capillary: 92 mg/dL (ref 65–99)

## 2015-10-20 MED ORDER — INSULIN ASPART 100 UNIT/ML ~~LOC~~ SOLN
0.0000 [IU] | SUBCUTANEOUS | Status: DC
  Administered 2015-10-20 – 2015-10-23 (×5): 2 [IU] via SUBCUTANEOUS

## 2015-10-20 MED ORDER — FAT EMULSION 20 % IV EMUL
240.0000 mL | INTRAVENOUS | Status: AC
  Administered 2015-10-20: 240 mL via INTRAVENOUS
  Filled 2015-10-20: qty 250

## 2015-10-20 MED ORDER — TRACE MINERALS CR-CU-MN-SE-ZN 10-1000-500-60 MCG/ML IV SOLN
INTRAVENOUS | Status: AC
  Administered 2015-10-20: 18:00:00 via INTRAVENOUS
  Filled 2015-10-20: qty 1700

## 2015-10-20 NOTE — Progress Notes (Signed)
Patient ID: Latasha Wood, female   DOB: 1956/05/01, 59 y.o.   MRN: [id number]  [facility]   Subjective: Patient in bed, husband at bedside. No issues overnight  Objective: Vital signs in last 24 hours: Temp:  [98.9 F (37.2 C)-99.5 F (37.5 C)] 98.9 F (37.2 C) (12/11 0537) Pulse Rate:  [88-98] 88 (12/11 0537) Resp:  [16] 16 (12/11 0537) BP: (106-119)/(65-72) 106/72 mmHg (12/11 0537) SpO2:  [99 %-100 %] 100 % (12/11 0537) Last BM Date: 10/19/15  Intake/Output from previous day: 12/10 0701 - 12/11 0700 In: 1665 [TPN:1665] Out: 1475 [Urine:1100; Drains:250; Stool:125] Intake/Output this shift:    Physical Exam: HEENT - sclerae clear, mucous membranes moist Neck - soft Abdomen - soft, obese; stoma viable with thin succus in bag, wound granulating well, brown tinged fluid in other ostomy bag Ext - no edema, non-tender Neuro - alert & oriented, no focal deficits  Lab Results:  No results for input(s): WBC, HGB, HCT, PLT in the last 72 hours. BMET  Recent Labs  10/19/15 0644  NA 130*  K 3.7  CL 95*  CO2 26  GLUCOSE 121*  BUN 30*  CREATININE 0.91  CALCIUM 8.7*   PT/INR No results for input(s): LABPROT, INR in the last 72 hours. Comprehensive Metabolic Panel:    Component Value Date/Time   NA 130* 10/19/2015 0644   NA 136 10/17/2015 0400   NA 139 07/31/2015 1001   K 3.7 10/19/2015 0644   K 3.9 10/17/2015 0400   CL 95* 10/19/2015 0644   CL 100* 10/17/2015 0400   CO2 26 10/19/2015 0644   CO2 28 10/17/2015 0400   BUN 30* 10/19/2015 0644   BUN 23* 10/17/2015 0400   BUN 20 07/31/2015 1001   CREATININE 0.91 10/19/2015 0644   CREATININE 0.88 10/17/2015 0400   GLUCOSE 121* 10/19/2015 0644   GLUCOSE 105* 10/17/2015 0400   GLUCOSE 86 07/31/2015 1001   CALCIUM 8.7* 10/19/2015 0644   CALCIUM 9.1 10/17/2015 0400   AST 32 10/19/2015 0644   AST 16 10/17/2015 0400   ALT 33 10/19/2015 0644   ALT 15 10/17/2015 0400   ALKPHOS 99 10/19/2015 0644   ALKPHOS 68 10/17/2015 0400   BILITOT 0.2* 10/19/2015 0644   BILITOT 0.4 10/17/2015 0400   BILITOT 0.4 07/31/2015 1001   PROT 7.0 10/19/2015 0644   PROT 6.9 10/17/2015 0400   PROT 6.7 07/31/2015 1001   ALBUMIN 2.4* 10/19/2015 0644   ALBUMIN 2.2* 10/17/2015 0400   ALBUMIN 3.9 07/31/2015 1001    Studies/Results: No results found.  Anti-infectives: Anti-infectives    Start     Dose/Rate Route Frequency Ordered Stop   10/07/15 1700  [MAR Hold]  ertapenem (INVANZ) 1 g in sodium chloride 0.9 % 50 mL IVPB  Status:  Discontinued     (MAR Hold since 10/08/15 1224)   1 g 100 mL/hr over 30 Minutes Intravenous Every 24 hours 10/07/15 1643 10/08/15 1809      Assessment & Plans: Necrosis of ileostomy Cont TPN, looks like she has a fistula. ~333m/day. Output decreased some with NPO. Cycling TPN for home use.  Hopefully home early next week OOB, ambulate Vac change MWF Benicar for HTN  Acute on chronic blood loss anemia: Hgb has been stable, will recheck in AM Ambulate   Yahsir Wickens C. 195/07/3266

## 2015-10-20 NOTE — Progress Notes (Signed)
PARENTERAL NUTRITION CONSULT NOTE - FOLLOW-UP  Pharmacy Consult for TPN Indication: protein calorie malnutrition  No Known Allergies  Patient Measurements: Height: 5' 3"  (160 cm) Weight: 196 lb 3.4 oz (89 kg) IBW/kg (Calculated) : 52.4  Vital Signs: Temp: 98.9 F (37.2 C) (12/11 0537) Temp Source: Oral (12/11 0537) BP: 106/72 mmHg (12/11 0537) Pulse Rate: 88 (12/11 0537) Intake/Output from previous day: 12/10 0701 - 12/11 0700 In: 1665 [TPN:1665] Out: 1475 [Urine:1100; Drains:250; Stool:125] Intake/Output from this shift:    Labs: No results for input(s): WBC, HGB, HCT, PLT, APTT, INR in the last 72 hours.  Recent Labs  10/19/15 0644  NA 130*  K 3.7  CL 95*  CO2 26  GLUCOSE 121*  BUN 30*  CREATININE 0.91  CALCIUM 8.7*  PROT 7.0  ALBUMIN 2.4*  AST 32  ALT 33  ALKPHOS 99  BILITOT 0.2*   Estimated Creatinine Clearance: 69.5 mL/min (by C-G formula based on Cr of 0.91).    Recent Labs  10/18/15 2031 10/19/15 0318 10/20/15 0353  GLUCAP 115* 86 122*   Medical History: Past Medical History  Diagnosis Date  . Hypertension   . Colitis, ulcerative ([place])   . Hyperlipidemia   . Colon polyps   . Blood transfusion   . Anemia   . Shortness of breath dyspnea     talking or walking   . Type 2 diabetes mellitus with complication, without long-term current use of insulin ([place]) 09/05/2015    currently on no medications   . Numbness and tingling     hands and feet bilat    Insulin Requirements:  6 units Novolog SSI in last 24h, note that 2 of 3 CBG's not charted, just insulin doses  Current Nutrition: NPO  IVF: NS at 3m/hr  Central access: PICC line placed 10/09/15 TPN start date: 10/09/15   ASSESSMENT                                                                                                          HPI:  89y/oF with hx ulcerative colitis s/p total abdominal proctectomy 09/25/15 presented to MD office 11/28 with frank necrosis of her ileostomy and  [facility] Patient underwent ileostomy revision and repair of small bowel enterotomy on 11/29. Pharmacy asked to start TPN for this patient due to protein calorie malnutrition from recent surgery and chronic illness until her bowel function resolves.   Significant events:  11/30: Patient pulled NG tube out overnight 12/1: Having low grade fevers 12/2: Per surgery, anticipate TPN at least  through Monday 12/5 12/5: continue TPN per surgery, may have clears for comfort 12/6: some ostomy output and some air in bag 12/8: 25cc in ostomy bag.  Likely new fistula developing.  Tolerating clears decently well 135/4 Start cyclic TPN 156/25 Prednisone discontinued, received this am  Today, 10/20/2015  Glucose - CBGs at goal (< 150). History of type 2 DM, currently not on any medications (last Hemoglobin A1c 6.6%)  Electrolytes - Na low this am, Corr Ca wnl  Renal -  SCr stable WNL; CrCl 72 CG; UOP adequate  LFTs -wnl  TGs - 82 (11/30), 166 (12/5)  Prealbumin - decreased and low as of 12/5  NUTRITIONAL GOALS                                                                                    RD recs: 1550-1750 Kcal/day, 75-85g Protein/day, 1.7L fluid/day  Clinimix 5/15 at a goal rate of 70 ml/hr + 20% fat emulsion at 10 ml/hr to provide: 84 g/day protein, 1672 Kcal/day.  Cyclic Clinimix E 0/73 over 18 hr delivers 84 gm protein, 1670 Kcal/day  PLAN                                                                                                                  At 1800 today:  Change to 71G cyclic TPN: 62IR/SW x 1 hr (1800-1900), 133cc/hr x 16 hours (1900-0700), then 50cc/hr (0700-0800)  Lipid infusion at 17 ml/hr x 14 hours (1800-0800)  TPN to contain standard multivitamins and trace elements.  Check CBGs 2 hours past cyclic TPN start, 1 hr past cyclic TPN d/c, and while off TPN (total 3 CBGs/24h period)  TPN lab panels on Mondays & Thursdays  Minda Ditto  PharmD Pager [phone] 10/20/2015, 8:41 AM

## 2015-10-21 LAB — COMPREHENSIVE METABOLIC PANEL
ALBUMIN: 2.4 g/dL — AB (ref 3.5–5.0)
ALT: 87 U/L — ABNORMAL HIGH (ref 14–54)
ANION GAP: 9 (ref 5–15)
AST: 57 U/L — AB (ref 15–41)
Alkaline Phosphatase: 125 U/L (ref 38–126)
BUN: 29 mg/dL — AB (ref 6–20)
CHLORIDE: 100 mmol/L — AB (ref 101–111)
CO2: 27 mmol/L (ref 22–32)
Calcium: 8.8 mg/dL — ABNORMAL LOW (ref 8.9–10.3)
Creatinine, Ser: 0.86 mg/dL (ref 0.44–1.00)
GFR calc Af Amer: >60 mL/min (ref >60)
GFR calc non Af Amer: >60 mL/min (ref >60)
GLUCOSE: 115 mg/dL — AB (ref 65–99)
POTASSIUM: 3.6 mmol/L (ref 3.5–5.1)
Sodium: 136 mmol/L (ref 135–145)
Total Bilirubin: 0.3 mg/dL (ref 0.3–1.2)
Total Protein: 6.5 g/dL (ref 6.5–8.1)

## 2015-10-21 LAB — CBC
HCT: 24.1 % — ABNORMAL LOW (ref 36.0–46.0)
HEMOGLOBIN: 7.9 g/dL — AB (ref 12.0–15.0)
MCH: 28.8 pg (ref 26.0–34.0)
MCHC: 32.8 g/dL (ref 30.0–36.0)
MCV: 88 fL (ref 78.0–100.0)
PLATELETS: 658 10*3/uL — AB (ref 150–400)
RBC: 2.74 MIL/uL — AB (ref 3.87–5.11)
RDW: 13.4 % (ref 11.5–15.5)
WBC: 7.7 10*3/uL (ref 4.0–10.5)

## 2015-10-21 LAB — DIFFERENTIAL
BASOS ABS: 0 10*3/uL (ref 0.0–0.1)
BASOS PCT: 0 %
EOS ABS: 0.3 10*3/uL (ref 0.0–0.7)
EOS PCT: 4 %
LYMPHS ABS: 2.1 10*3/uL (ref 0.7–4.0)
Lymphocytes Relative: 28 %
MONO ABS: 0.9 10*3/uL (ref 0.1–1.0)
MONOS PCT: 12 %
Neutro Abs: 4.4 10*3/uL (ref 1.7–7.7)
Neutrophils Relative %: 56 %

## 2015-10-21 LAB — MAGNESIUM: MAGNESIUM: 2.1 mg/dL (ref 1.7–2.4)

## 2015-10-21 LAB — GLUCOSE, CAPILLARY
Glucose-Capillary: 96 mg/dL (ref 65–99)
Glucose-Capillary: 86 mg/dL (ref 65–99)

## 2015-10-21 LAB — TRIGLYCERIDES: Triglycerides: 229 mg/dL — ABNORMAL HIGH (ref <150)

## 2015-10-21 LAB — PHOSPHORUS: Phosphorus: 4.1 mg/dL (ref 2.5–4.6)

## 2015-10-21 LAB — PREALBUMIN: Prealbumin: 25.3 mg/dL (ref 18–38)

## 2015-10-21 MED ORDER — FAT EMULSION 20 % IV EMUL
238.0000 mL | INTRAVENOUS | Status: AC
  Administered 2015-10-21: 238 mL via INTRAVENOUS
  Filled 2015-10-21: qty 250

## 2015-10-21 MED ORDER — OXYCODONE HCL 5 MG/5ML PO SOLN
5.0000 mg | ORAL | Status: DC | PRN
  Administered 2015-10-21 – 2015-10-24 (×7): 10 mg via ORAL
  Filled 2015-10-21 (×7): qty 10

## 2015-10-21 MED ORDER — HYDROCORTISONE 1 % EX CREA
TOPICAL_CREAM | CUTANEOUS | Status: DC | PRN
  Administered 2015-10-22: 01:00:00 via TOPICAL
  Filled 2015-10-21: qty 28

## 2015-10-21 MED ORDER — M.V.I. ADULT IV INJ
INJECTION | INTRAVENOUS | Status: AC
  Administered 2015-10-21: 19:00:00 via INTRAVENOUS
  Filled 2015-10-21: qty 1700

## 2015-10-21 MED ORDER — LORAZEPAM 2 MG/ML IJ SOLN
0.5000 mg | Freq: Three times a day (TID) | INTRAMUSCULAR | Status: DC | PRN
  Administered 2015-10-21 – 2015-10-23 (×4): 0.5 mg via INTRAVENOUS
  Filled 2015-10-21 (×4): qty 1

## 2015-10-21 NOTE — Progress Notes (Signed)
Physical Therapy Treatment Patient Details Name: Latasha Wood MRN: [id number] DOB: 08/22/1956 Today's Date: 10/21/2015    History of Present Illness 59 year old female status post total proctocolectomy with small bowel resection admitted for necrosis of ileostomy.  PMHx of ulcerative colitis, HTN, hyperlipidemia, SBO, anxiety    PT Comments    Pt progressing well; will check back to monitor progress  With gait and progress pt I with HEP  Follow Up Recommendations  No PT follow up     Equipment Recommendations  None recommended by PT    Recommendations for Other Services       Precautions / Restrictions Precautions Precautions: Other (comment) Precaution Comments: ileostomy, wound vac Restrictions Weight Bearing Restrictions: No    Mobility  Bed Mobility Overal bed mobility: Modified Independent             General bed mobility comments: HOB elevated  Transfers Overall transfer level: Modified independent Equipment used: None Transfers: Sit to/from Stand              Ambulation/Gait Ambulation/Gait assistance: Supervision;Modified independent (Device/Increase time) Ambulation Distance (Feet): 500 Feet Assistive device: None (occasional rail touch) Gait Pattern/deviations: Step-through pattern   Gait velocity interpretation: Below normal speed for age/gender General Gait Details: occasional uses rail for support,  holds abdomen intermittently (and briefly) during gait, requires 5 standing rests d/t fatigue (pt describes "hip tightness")   Stairs            Wheelchair Mobility    Modified Rankin (Stroke Patients Only)       Balance                                    Cognition Arousal/Alertness: Awake/alert Behavior During Therapy: WFL for tasks assessed/performed Overall Cognitive Status: Within Functional Limits for tasks assessed                      Exercises General Exercises - Upper  Extremity Shoulder Flexion:  (demo'd ther ex below with yellow Tband; defrred practice d/t WOC RN waiting and pt visiting with family) General Exercises - Lower Extremity Hip Flexion/Marching: AROM;5 reps;Both;Standing    General Comments General comments (skin integrity, edema, etc.): pt instructed in hip flexor and hip adductor stretches, pt return demo's with supervision      Pertinent Vitals/Pain Pain Location: I had meds, I'm ok    Home Living                      Prior Function            PT Goals (current goals can now be found in the care plan section) Acute Rehab PT Goals Patient Stated Goal: increase mobility, maintain strength PT Goal Formulation: With patient Time For Goal Achievement: 10/24/15 Potential to Achieve Goals: Good Progress towards PT goals: Progressing toward goals    Frequency  Min 2X/week    PT Plan Current plan remains appropriate;Frequency needs to be updated    Co-evaluation             End of Session   Activity Tolerance: Patient tolerated treatment well Patient left: Other (comment) (standing in room with family)     Time: 1130-1207 PT Time Calculation (min) (ACUTE ONLY): 37 min  Charges:  $Gait Training: 8-22 mins $Therapeutic Exercise: 8-22 mins  G CodesKenyon Wood 10/21/2015, 12:10 PM

## 2015-10-21 NOTE — Progress Notes (Signed)
PARENTERAL NUTRITION CONSULT NOTE - FOLLOW-UP  Pharmacy Consult for TPN Indication: protein-calorie malnutrition; prolonged NPO status secondary to necrosis of  Ileostomy with fistula  No Known Allergies  Patient Measurements: Height: 5' 3"  (160 cm) Weight: 196 lb 3.4 oz (89 kg) IBW/kg (Calculated) : 52.4  Vital Signs: Temp: 98.8 F (37.1 C) (12/12 0600) Temp Source: Oral (12/12 0600) BP: 124/72 mmHg (12/12 0600) Pulse Rate: 105 (12/12 0600) Intake/Output from previous day: 12/11 0701 - 12/12 0700 In: 2423.2 [I.V.:20; TPN:2403.2] Out: 1850 [Urine:1350; Drains:425; Stool:75] Intake/Output from this shift:    Labs:  Recent Labs  10/21/15 0142  WBC 7.7  HGB 7.9*  HCT 24.1*  PLT 658*    Recent Labs  10/19/15 0644 10/21/15 0141 10/21/15 0142  NA 130*  --  136  K 3.7  --  3.6  CL 95*  --  100*  CO2 26  --  27  GLUCOSE 121*  --  115*  BUN 30*  --  29*  CREATININE 0.91  --  0.86  CALCIUM 8.7*  --  8.8*  MG  --   --  2.1  PHOS  --   --  4.1  PROT 7.0  --  6.5  ALBUMIN 2.4*  --  2.4*  AST 32  --  57*  ALT 33  --  87*  ALKPHOS 99  --  125  BILITOT 0.2*  --  0.3  PREALBUMIN  --  25.3  --   TRIG  --   --  229*   Estimated Creatinine Clearance: 73.6 mL/min (by C-G formula based on Cr of 0.86).    Recent Labs  10/20/15 1248 10/20/15 1507 10/21/15 0904  GLUCAP 94 92 96   Medical History: Past Medical History  Diagnosis Date  . Hypertension   . Colitis, ulcerative ([place])   . Hyperlipidemia   . Colon polyps   . Blood transfusion   . Anemia   . Shortness of breath dyspnea     talking or walking   . Type 2 diabetes mellitus with complication, without long-term current use of insulin ([place]) 09/05/2015    currently on no medications   . Numbness and tingling     hands and feet bilat    Insulin Requirements:  2 units at 8pm (note CBG not recorded)  Current Nutrition:  NPO Clinimix-E 5/15, 1.7 L/day as cyclic regimen over 14 hours Fat Emulsion 20%, 132  mL/day as cyclic regimen over 14 hours  IVF: saline lock  Central access: PICC line placed 10/09/15 TPN start date: 10/09/15   ASSESSMENT                                                                                                          HPI:  77 y/oF with hx ulcerative colitis s/p total abdominal proctectomy 09/25/15 presented to MD office 11/28 with frank necrosis of her ileostomy and was subsequently admitted to [facility]. Patient underwent ileostomy revision and repair of small bowel enterotomy on 11/29. Pharmacy asked to start TPN for this patient due to  protein calorie malnutrition from recent surgery and chronic illness until her bowel function resolves.   Significant events:  11/30: Patient pulled NG tube out overnight 12/1: Having low grade fevers 12/2: Per surgery, anticipate TPN at least  through Monday 12/5 12/5: continue TPN per surgery, may have clears for comfort 12/6: some ostomy output and some air in bag 12/8: 25cc in ostomy bag.  Likely new fistula developing.  Tolerating clears decently well 12/9:  NPO except ice chips, sips with meds. Start cycling TPN. 12/10: Prednisone discontinued, received this am. 12/12: Fistula output 425 mL/24 hr  Today, 10/21/2015  Glucose - CBGs at goal (< 150). History of type 2 DM but not requiring any meds for this PTA (last Hemoglobin A1c 6.6%).  Electrolytes - K falling but still WNL  Renal - SCr stable, WNL; UOP adequate  LFTs - AST and ALT up today, but < 1.5x ULN. Current 24-hr glucose infusion rate is not excessive (~46m/kg/min).  The recent conversion to cyclic administration may help limit increase in transaminases.  TGs - rising: 82 (11/30), 166 (12/5), 229 (12/12); not necessary to remove Fat Emulsion from regimen unless TG exceeds 400.  Prealbumin - improved, now WNL:  11.2 (12/5), 25.3 (12/12)  NUTRITIONAL GOALS                                                                                    RD recs: 1550-1750  Kcal/day, 75-85g Protein/day, 1.7L fluid/day  Current cyclic regimen of Clinimix E 5/15 1.7 L over 14 hr plus Fat Emulsion 20% 238 mL over 14 hr delivers: 85 gm protein, 1680 KCal/day  PLAN                                                                                                                  At 1800 today:  Continue 14 h cyclic TPN: 522VV/KPx 1 hr (1800-1900), 1363mhr x 12 hours (1900-0700), then 507mr (0700-0800), then off x 10 hr (0800-1800).  Lipid infusion at 17 ml/hr x 14 hours (1800-0800).  TPN to contain standard multivitamins and trace elements.  Check CBGs 2 hours past cyclic TPN start, 1 hr past cyclic TPN d/c, and while off TPN (total 3 CBGs/24h period). BMet and TG tomorrow AM - watch K and TG. Routine TPN lab panels on Mondays & Thursdays - watch transaminases. Follow clinical course.  RanClayburn PertharmD, BCPS Pager: [phone] 8940/10/2015  10:00 AM

## 2015-10-21 NOTE — Consult Note (Signed)
WOC wound follow up Wound type:Midline  Measurement: Proximal:  5cm 1.5 x 0.4cm  Distal: 3cm x 1cm X 0.4cm Wound bed:red, moist Drainage (amount, consistency, odor) serous, scant Periwound:intact, dry Dressing procedure/placement/frequency: 1 piece of drape placed over skin on RMQ.  Mepitel placed over wound as a wounds contact layer, then topped with black foam (2).  Proximal and distal wounds are bridged to LMQ (1 piece of foam cut into a "V" shape).  Skin barrier ring placed around ileostomy and pouching system applied.   Skin barrier ring (1/3 of a ring) placed from 2-5 o'clock at fistula.  Convex pouching system placed over fistula. Small piece of drape applied over ileostomy pouch. System attached to 172mHg continuous negative pressure and an immediate seal is achieved. Supplies:  2 pieces Mepitel silicone wound contact layer.  3 pieces of Black Foam (granufoam). Drape.   WOC wound follow up Wound type: Fistula, enterocutaneous, RUQ at old ileostomy site Measurement:7/8 inch x 1 and 1/8 inch oval Wound bed:red, moist, two sutures span gap/defect Drainage (amount, consistency, odor) light brown effluent, air bubbles noted. Periwound:intact, dry Dressing procedure/placement/frequency: One-third of a skin barrier ring placed on parastomal skin from 2-5 o'clock. 1-piece convex pouching system applied.   WOC ostomy follow up Stoma type/location: Midline, separates proximal and distal wounds Stomal assessment/size: 100% red.  1 inch round, budded Peristomal assessment: Full thickness surgical wounds proximally and distally Treatment options for stomal/peristomal skin: NPWT plus skin barrier ring Output small amount brown succus Ostomy pouching: 1pc. Education provided: Reinforcement of routine, patient encouraged to follow periods of activity with periods of rest. Husband is observing routine now that we have achieved seal twice on first attempt.  Enrolled patient in [facility] program: Yes   [facility].  Patient and family are thinking that Wednesday or Thursday might be discharge goal.  Message left and awaiting return call.  [facility] team will follow, and will remain available to this patient, the nursing, surgical and medical teams.  Please re-consult if needed. Thanks, LMaudie Flakes MSN, RN, GGarrett CArther Abbott Pager# [phone]

## 2015-10-21 NOTE — Progress Notes (Signed)
[facility]  Patient Status: Active Pt with [facility] patient  [facility] is providing the following services: HHRN and pt will now have TNA pharmacy team for home TNA. [facility] hospital infusion coordinator will provide in hospital with pt and husband to support independence at home.  If patient discharges after hours, please call [phone].   Larry Sierras 10/21/2015, 2:11 PM

## 2015-10-21 NOTE — Progress Notes (Signed)
Patient ID: Latasha Wood, female   DOB: 08-12-1956, 59 y.o.   MRN: [id number]  [facility]   Subjective: Patient in bed, husband at bedside. No issues overnight  Objective: Vital signs in last 24 hours: Temp:  [98.1 F (36.7 C)-98.8 F (37.1 C)] 98.8 F (37.1 C) (12/12 0600) Pulse Rate:  [82-105] 105 (12/12 0600) Resp:  [16-18] 18 (12/12 0600) BP: (102-124)/(72-78) 124/72 mmHg (12/12 0600) SpO2:  [100 %] 100 % (12/12 0600) Last BM Date: 10/19/15  Intake/Output from previous day: 12/11 0701 - 12/12 0700 In: 2423.2 [I.V.:20; TPN:2403.2] Out: 1850 [Urine:1350; Drains:425; Stool:75] Intake/Output this shift:    Physical Exam: HEENT - sclerae clear, mucous membranes moist Neck - soft Abdomen - soft, obese; stoma viable with thin succus in bag, wound granulating well, brown tinged fluid in other ostomy bag Ext - no edema, non-tender Neuro - alert & oriented, no focal deficits  Lab Results:   Recent Labs  10/21/15 0142  WBC 7.7  HGB 7.9*  HCT 24.1*  PLT 658*   BMET  Recent Labs  10/19/15 0644 10/21/15 0142  NA 130* 136  K 3.7 3.6  CL 95* 100*  CO2 26 27  GLUCOSE 121* 115*  BUN 30* 29*  CREATININE 0.91 0.86  CALCIUM 8.7* 8.8*   PT/INR No results for input(s): LABPROT, INR in the last 72 hours. Comprehensive Metabolic Panel:    Component Value Date/Time   NA 136 10/21/2015 0142   NA 130* 10/19/2015 0644   NA 139 07/31/2015 1001   K 3.6 10/21/2015 0142   K 3.7 10/19/2015 0644   CL 100* 10/21/2015 0142   CL 95* 10/19/2015 0644   CO2 27 10/21/2015 0142   CO2 26 10/19/2015 0644   BUN 29* 10/21/2015 0142   BUN 30* 10/19/2015 0644   BUN 20 07/31/2015 1001   CREATININE 0.86 10/21/2015 0142   CREATININE 0.91 10/19/2015 0644   GLUCOSE 115* 10/21/2015 0142   GLUCOSE 121* 10/19/2015 0644   GLUCOSE 86 07/31/2015 1001   CALCIUM 8.8* 10/21/2015 0142   CALCIUM 8.7* 10/19/2015 0644   AST 57* 10/21/2015 0142   AST 32  10/19/2015 0644   ALT 87* 10/21/2015 0142   ALT 33 10/19/2015 0644   ALKPHOS 125 10/21/2015 0142   ALKPHOS 99 10/19/2015 0644   BILITOT 0.3 10/21/2015 0142   BILITOT 0.2* 10/19/2015 0644   BILITOT 0.4 07/31/2015 1001   PROT 6.5 10/21/2015 0142   PROT 7.0 10/19/2015 0644   PROT 6.7 07/31/2015 1001   ALBUMIN 2.4* 10/21/2015 0142   ALBUMIN 2.4* 10/19/2015 0644   ALBUMIN 3.9 07/31/2015 1001    Studies/Results: No results found.  Anti-infectives: Anti-infectives    Start     Dose/Rate Route Frequency Ordered Stop   10/07/15 1700  [MAR Hold]  ertapenem (INVANZ) 1 g in sodium chloride 0.9 % 50 mL IVPB  Status:  Discontinued     (MAR Hold since 10/08/15 1224)   1 g 100 mL/hr over 30 Minutes Intravenous Every 24 hours 10/07/15 1643 10/08/15 1809      Assessment & Plans: Necrosis of ileostomy Cont TPN, looks like she has a fistula. ~463m/day. Output decreased some with NPO. Cycling TPN for home use.  Hopefully home early next week OOB, ambulate Vac change MWF Benicar for HTN  Will try PO pain meds today  Acute on chronic blood loss anemia: Hgb has been stable Ambulate   Aolani Piggott C. 189/37/3428

## 2015-10-22 LAB — BASIC METABOLIC PANEL
ANION GAP: 8 (ref 5–15)
BUN: 35 mg/dL — AB (ref 6–20)
CO2: 26 mmol/L (ref 22–32)
Calcium: 8.9 mg/dL (ref 8.9–10.3)
Chloride: 100 mmol/L — ABNORMAL LOW (ref 101–111)
Creatinine, Ser: 1.1 mg/dL — ABNORMAL HIGH (ref 0.44–1.00)
GFR calc Af Amer: >60 mL/min (ref >60)
GFR calc non Af Amer: 53 mL/min — ABNORMAL LOW (ref >60)
GLUCOSE: 128 mg/dL — AB (ref 65–99)
POTASSIUM: 4 mmol/L (ref 3.5–5.1)
Sodium: 134 mmol/L — ABNORMAL LOW (ref 135–145)

## 2015-10-22 LAB — TRIGLYCERIDES: Triglycerides: 224 mg/dL — ABNORMAL HIGH (ref <150)

## 2015-10-22 LAB — CBC WITH DIFFERENTIAL/PLATELET
Basophils Absolute: 0 10*3/uL (ref 0.0–0.1)
Basophils Relative: 0 %
Eosinophils Absolute: 0.3 10*3/uL (ref 0.0–0.7)
Eosinophils Relative: 4 %
HEMATOCRIT: 22.9 % — AB (ref 36.0–46.0)
HEMOGLOBIN: 7.4 g/dL — AB (ref 12.0–15.0)
LYMPHS ABS: 2.4 10*3/uL (ref 0.7–4.0)
LYMPHS PCT: 29 %
MCH: 27.8 pg (ref 26.0–34.0)
MCHC: 32.3 g/dL (ref 30.0–36.0)
MCV: 86.1 fL (ref 78.0–100.0)
MONOS PCT: 10 %
Monocytes Absolute: 0.8 10*3/uL (ref 0.1–1.0)
NEUTROS PCT: 57 %
Neutro Abs: 4.6 10*3/uL (ref 1.7–7.7)
Platelets: 582 10*3/uL — ABNORMAL HIGH (ref 150–400)
RBC: 2.66 MIL/uL — AB (ref 3.87–5.11)
RDW: 13.5 % (ref 11.5–15.5)
WBC: 8.1 10*3/uL (ref 4.0–10.5)

## 2015-10-22 LAB — GLUCOSE, CAPILLARY
GLUCOSE-CAPILLARY: 86 mg/dL (ref 65–99)
Glucose-Capillary: 96 mg/dL (ref 65–99)

## 2015-10-22 MED ORDER — FAT EMULSION 20 % IV EMUL
238.0000 mL | INTRAVENOUS | Status: AC
  Administered 2015-10-22: 238 mL via INTRAVENOUS
  Filled 2015-10-22: qty 238

## 2015-10-22 MED ORDER — M.V.I. ADULT IV INJ
INJECTION | INTRAVENOUS | Status: AC
  Administered 2015-10-22: 18:00:00 via INTRAVENOUS
  Filled 2015-10-22: qty 1700

## 2015-10-22 MED ORDER — SODIUM CHLORIDE 0.9 % IV BOLUS (SEPSIS)
500.0000 mL | Freq: Once | INTRAVENOUS | Status: AC
  Administered 2015-10-22: 500 mL via INTRAVENOUS

## 2015-10-22 MED ORDER — SODIUM CHLORIDE 0.9 % IV SOLN
INTRAVENOUS | Status: AC
  Administered 2015-10-22: 13:00:00 via INTRAVENOUS

## 2015-10-22 NOTE — Consult Note (Signed)
WOC wound follow up Discussed care with [place] Nurse at [facility], Rudi Coco in detail. Writer will provide family and HHA with written instructions for care and if possible, a coordinated visit with a [place] will occur prior to discharge. [place] nursing team will not follow, but will remain available to this patient, the nursing and medical teams.  Please re-consult if needed. Thanks, Maudie Flakes, MSN, RN, [place], Arther Abbott  Pager# [phone]

## 2015-10-22 NOTE — Progress Notes (Signed)
PARENTERAL NUTRITION CONSULT NOTE - FOLLOW-UP  Pharmacy Consult for TPN Indication: protein-calorie malnutrition; prolonged NPO status secondary to necrosis of  Ileostomy with fistula  No Known Allergies  Patient Measurements: Height: 5' 3"  (160 cm) Weight: 196 lb 3.4 oz (89 kg) IBW/kg (Calculated) : 52.4  Vital Signs: Temp: 100 F (37.8 C) (12/13 0600) Temp Source: Oral (12/13 0600) BP: 99/47 mmHg (12/13 0600) Pulse Rate: 98 (12/13 0600) Intake/Output from previous day: 12/12 0701 - 12/13 0700 In: 1903 [TPN:1903] Out: 1175 [Urine:800; Drains:225; Stool:150] Intake/Output from this shift:    Labs:  Recent Labs  10/21/15 0142  WBC 7.7  HGB 7.9*  HCT 24.1*  PLT 658*    Recent Labs  10/21/15 0141 10/21/15 0142 10/22/15 0425  NA  --  136 134*  K  --  3.6 4.0  CL  --  100* 100*  CO2  --  27 26  GLUCOSE  --  115* 128*  BUN  --  29* 35*  CREATININE  --  0.86 1.10*  CALCIUM  --  8.8* 8.9  MG  --  2.1  --   PHOS  --  4.1  --   PROT  --  6.5  --   ALBUMIN  --  2.4*  --   AST  --  57*  --   ALT  --  87*  --   ALKPHOS  --  125  --   BILITOT  --  0.3  --   PREALBUMIN 25.3  --   --   TRIG  --  229*  --    Estimated Creatinine Clearance: 57.5 mL/min (by C-G formula based on Cr of 1.1).    Recent Labs  10/20/15 1507 10/21/15 0904 10/21/15 1452  GLUCAP 92 96 86   Insulin Requirements/ 24hr:  2 units at 2200 (note CBG not recorded)  Current Nutrition:  NPO Clinimix-E 5/15, 1.7 L/day as cyclic regimen over 14 hours Fat Emulsion 20%, 425 mL/day as cyclic regimen over 14 hours  IVF: saline lock  Central access: PICC line placed 10/09/15 TPN start date: 10/09/15   ASSESSMENT                                                                                                          HPI:  51 y/oF with hx ulcerative colitis s/p total abdominal proctectomy 09/25/15 presented to MD office 11/28 with frank necrosis of her ileostomy and was subsequently admitted to  [facility]. Patient underwent ileostomy revision and repair of small bowel enterotomy on 11/29. Pharmacy asked to start TPN for this patient due to protein calorie malnutrition from recent surgery and chronic illness until her bowel function resolves.   Significant events:  11/30: Patient pulled NG tube out overnight 12/2: Per surgery, anticipate TPN at least  through Monday 12/5 12/5: continue TPN per surgery, may have clears for comfort 12/6: some ostomy output and some air in bag 12/8: 25cc in ostomy bag.  Likely new fistula developing.  Tolerating clears. 12/9:  NPO except ice chips, sips with meds.  Start cycling TPN. 12/10: Prednisone discontinued, received this am. 12/12: Fistula output 425 mL/24 hr 12/13 Fever, soft BP, UOP reduced, Fistula output decreased to 225 ml/24hr with ileostomy output 150 ml/24hr.  IVF added, no further cyclic consolidation.  Today, 10/22/2015  Glucose - CBGs at goal (< 150). History of type 2 DM but not requiring any meds for this PTA (last Hemoglobin A1c 6.6%).  Electrolytes - K improved, remains WNL  Renal - SCr increased (0.86 > 1.1); UOP decreased (0.37 ml/kg/hr on 12/12 down from 0.63 ml/kg/hr on 12/11)  LFTs - AST and ALT increased 12/12, but < 1.5x ULN. Current 24-hr glucose infusion rate is not excessive (~63m/kg/min).  The recent conversion to cyclic administration may help limit increase in transaminases.  TGs - rising: 82 (11/30), 166 (12/5), 229 (12/12); slightly improved to 224 (12/13).  Not necessary to remove Fat Emulsion from regimen unless TG exceeds 400.   Prealbumin - improved, now WNL:  11.2 (12/5), 25.3 (12/12)  NUTRITIONAL GOALS                                                                                    RD recs: 1550-1750 Kcal/day, 75-85g Protein/day, 1.7L fluid/day  Current cyclic regimen of Clinimix E 5/15 1.7 L over 14 hr plus Fat Emulsion 20% 238 mL over 14 hr delivers: 85 gm protein, 1680 KCal/day  PLAN                                                                                                                  Continue 14 hour TPN cycle today with increased SCr and low BP.  Noted addition of IVF, and holding anti-HTN meds per MD.  After discharge, TPN may be further condensed to cycle over 12 hours, or additional fluids added.   Continue 14 h cyclic TPN: 513HY/HOx 1 hr (1800-1900), 13104mhr x 12 hours (1900-0700), then 5081mr (0700-0800), then off x 10 hr (0800-1800).  Lipid infusion at 17 ml/hr x 14 hours (1800-0800).  TPN to contain standard multivitamins and trace elements.  Check CBGs 2 hours past cyclic TPN start, 1 hr past cyclic TPN d/c, and while off TPN (total 3 CBGs/24h period).  Cmet and TG tomorrow AM - watch K, SCr, transaminases, and TG.  Routine TPN lab panels on Mondays & Thursdays.  ChrGretta ArabarmD, BCPS Pager [phone]/13/2016 7:06 AM

## 2015-10-22 NOTE — Progress Notes (Signed)
Patient ID: Latasha Wood, female   DOB: 05/10/1956, 59 y.o.   MRN: [id number]  [facility]   Subjective: Patient feeling more lethargic today.  Having some lower abd pain.  Fever this am.  Pt denies cough or SOB.  Having some dysuria.    Objective: Vital signs in last 24 hours: Temp:  [97.2 F (36.2 C)-101.1 F (38.4 C)] 99.7 F (37.6 C) (12/13 1105) Pulse Rate:  [92-109] 106 (12/13 1105) Resp:  [16-18] 16 (12/13 1105) BP: (86-106)/(47-63) 106/48 mmHg (12/13 1105) SpO2:  [98 %-100 %] 98 % (12/13 1105) Last BM Date: 10/19/15  Intake/Output from previous day: 12/12 0701 - 12/13 0700 In: 1903 [TPN:1903] Out: 1175 [Urine:800; Drains:225; Stool:150] Intake/Output this shift: Total I/O In: 500 [IV Piggyback:500] Out: 50 [Stool:50]  Physical Exam: HEENT - sclerae clear, mucous membranes moist Neck - soft Abdomen - soft, obese; stoma viable with thin succus in bag, wound granulating well, brown tinged fluid in other ostomy bag Ext - no edema, non-tender Neuro - alert & oriented, no focal deficits  Lab Results:   Recent Labs  10/21/15 0142 10/22/15 1005  WBC 7.7 8.1  HGB 7.9* 7.4*  HCT 24.1* 22.9*  PLT 658* 582*   BMET  Recent Labs  10/21/15 0142 10/22/15 0425  NA 136 134*  K 3.6 4.0  CL 100* 100*  CO2 27 26  GLUCOSE 115* 128*  BUN 29* 35*  CREATININE 0.86 1.10*  CALCIUM 8.8* 8.9   PT/INR No results for input(s): LABPROT, INR in the last 72 hours. Comprehensive Metabolic Panel:    Component Value Date/Time   NA 134* 10/22/2015 0425   NA 136 10/21/2015 0142   NA 139 07/31/2015 1001   K 4.0 10/22/2015 0425   K 3.6 10/21/2015 0142   CL 100* 10/22/2015 0425   CL 100* 10/21/2015 0142   CO2 26 10/22/2015 0425   CO2 27 10/21/2015 0142   BUN 35* 10/22/2015 0425   BUN 29* 10/21/2015 0142   BUN 20 07/31/2015 1001   CREATININE 1.10* 10/22/2015 0425   CREATININE 0.86 10/21/2015 0142   GLUCOSE 128* 10/22/2015 0425   GLUCOSE 115* 10/21/2015 0142   GLUCOSE 86 07/31/2015 1001   CALCIUM 8.9 10/22/2015 0425   CALCIUM 8.8* 10/21/2015 0142   AST 57* 10/21/2015 0142   AST 32 10/19/2015 0644   ALT 87* 10/21/2015 0142   ALT 33 10/19/2015 0644   ALKPHOS 125 10/21/2015 0142   ALKPHOS 99 10/19/2015 0644   BILITOT 0.3 10/21/2015 0142   BILITOT 0.2* 10/19/2015 0644   BILITOT 0.4 07/31/2015 1001   PROT 6.5 10/21/2015 0142   PROT 7.0 10/19/2015 0644   PROT 6.7 07/31/2015 1001   ALBUMIN 2.4* 10/21/2015 0142   ALBUMIN 2.4* 10/19/2015 0644   ALBUMIN 3.9 07/31/2015 1001    Studies/Results: No results found.  Anti-infectives: Anti-infectives    Start     Dose/Rate Route Frequency Ordered Stop   10/07/15 1700  [MAR Hold]  ertapenem (INVANZ) 1 g in sodium chloride 0.9 % 50 mL IVPB  Status:  Discontinued     (MAR Hold since 10/08/15 1224)   1 g 100 mL/hr over 30 Minutes Intravenous Every 24 hours 10/07/15 1643 10/08/15 1809      Assessment & Plans: Necrosis of ileostomy Cont TPN, looks like she has a fistula. ~477m/day. Output decreased some with NPO. Cycling TPN for home use.  Cr up a bit today, getting some extra fluids  Fever: may  need CT scan but would like to wait until Cr back down.  Will get UA today.  Recheck labs in AM OOB, ambulate Vac change MWF Benicar for HTN, will hold for now, given low BP this am  Cont PO pain meds as needed  Tylenol as needed for fevers  Acute on chronic blood loss anemia: Hgb down a bit today.  I see no signs of active bleeding currently Ambulate   Jerric Oyen C. 25/95/6387

## 2015-10-22 NOTE — Progress Notes (Signed)
Dr Marcello Moores made aware patient temp of 101.1.  BP 96/60  Orders received

## 2015-10-23 LAB — COMPREHENSIVE METABOLIC PANEL
ALBUMIN: 2.3 g/dL — AB (ref 3.5–5.0)
ALK PHOS: 101 U/L (ref 38–126)
ALT: 46 U/L (ref 14–54)
AST: 31 U/L (ref 15–41)
Anion gap: 7 (ref 5–15)
BILIRUBIN TOTAL: 0.6 mg/dL (ref 0.3–1.2)
BUN: 27 mg/dL — AB (ref 6–20)
CHLORIDE: 103 mmol/L (ref 101–111)
CO2: 24 mmol/L (ref 22–32)
CREATININE: 0.76 mg/dL (ref 0.44–1.00)
Calcium: 8.4 mg/dL — ABNORMAL LOW (ref 8.9–10.3)
Glucose, Bld: 131 mg/dL — ABNORMAL HIGH (ref 65–99)
POTASSIUM: 3.8 mmol/L (ref 3.5–5.1)
SODIUM: 134 mmol/L — AB (ref 135–145)
TOTAL PROTEIN: 6.1 g/dL — AB (ref 6.5–8.1)

## 2015-10-23 LAB — GLUCOSE, CAPILLARY
GLUCOSE-CAPILLARY: 122 mg/dL — AB (ref 65–99)
GLUCOSE-CAPILLARY: 114 mg/dL — AB (ref 65–99)
GLUCOSE-CAPILLARY: 136 mg/dL — AB (ref 65–99)
GLUCOSE-CAPILLARY: 130 mg/dL — AB (ref 65–99)
GLUCOSE-CAPILLARY: 121 mg/dL — AB (ref 65–99)
Glucose-Capillary: 146 mg/dL — ABNORMAL HIGH (ref 65–99)
Glucose-Capillary: 134 mg/dL — ABNORMAL HIGH (ref 65–99)
Glucose-Capillary: 98 mg/dL (ref 65–99)
Glucose-Capillary: 134 mg/dL — ABNORMAL HIGH (ref 65–99)

## 2015-10-23 LAB — URINALYSIS, ROUTINE W REFLEX MICROSCOPIC
Bilirubin Urine: NEGATIVE
GLUCOSE, UA: NEGATIVE mg/dL
Hgb urine dipstick: NEGATIVE
KETONES UR: NEGATIVE mg/dL
LEUKOCYTES UA: NEGATIVE
NITRITE: NEGATIVE
PH: 5 (ref 5.0–8.0)
Protein, ur: NEGATIVE mg/dL
SPECIFIC GRAVITY, URINE: 1.029 (ref 1.005–1.030)

## 2015-10-23 LAB — CBC
HEMATOCRIT: 17.8 % — AB (ref 36.0–46.0)
HEMATOCRIT: 22.4 % — AB (ref 36.0–46.0)
HEMOGLOBIN: 5.8 g/dL — AB (ref 12.0–15.0)
HEMOGLOBIN: 7.4 g/dL — AB (ref 12.0–15.0)
MCH: 28.2 pg (ref 26.0–34.0)
MCH: 28.4 pg (ref 26.0–34.0)
MCHC: 32.6 g/dL (ref 30.0–36.0)
MCHC: 33 g/dL (ref 30.0–36.0)
MCV: 86.4 fL (ref 78.0–100.0)
MCV: 85.8 fL (ref 78.0–100.0)
Platelets: 565 10*3/uL — ABNORMAL HIGH (ref 150–400)
Platelets: 529 10*3/uL — ABNORMAL HIGH (ref 150–400)
RBC: 2.06 MIL/uL — AB (ref 3.87–5.11)
RBC: 2.61 MIL/uL — AB (ref 3.87–5.11)
RDW: 13.2 % (ref 11.5–15.5)
RDW: 13.2 % (ref 11.5–15.5)
WBC: 6.9 10*3/uL (ref 4.0–10.5)
WBC: 7.2 10*3/uL (ref 4.0–10.5)

## 2015-10-23 LAB — PHOSPHORUS: PHOSPHORUS: 3.6 mg/dL (ref 2.5–4.6)

## 2015-10-23 LAB — TRIGLYCERIDES: Triglycerides: 207 mg/dL — ABNORMAL HIGH (ref <150)

## 2015-10-23 LAB — PREPARE RBC (CROSSMATCH)

## 2015-10-23 LAB — MAGNESIUM: MAGNESIUM: 2 mg/dL (ref 1.7–2.4)

## 2015-10-23 MED ORDER — FAMOTIDINE 20 MG PO TABS
20.0000 mg | ORAL_TABLET | Freq: Two times a day (BID) | ORAL | Status: DC
  Administered 2015-10-23 – 2015-10-24 (×3): 20 mg via ORAL
  Filled 2015-10-23 (×4): qty 1

## 2015-10-23 MED ORDER — SODIUM CHLORIDE 0.9 % IV SOLN: Freq: Once | INTRAVENOUS | Status: DC

## 2015-10-23 MED ORDER — TRACE MINERALS CR-CU-MN-SE-ZN 10-1000-500-60 MCG/ML IV SOLN
INTRAVENOUS | Status: AC
  Administered 2015-10-23: 17:00:00 via INTRAVENOUS
  Filled 2015-10-23: qty 1700

## 2015-10-23 MED ORDER — FAT EMULSION 20 % IV EMUL
238.0000 mL | INTRAVENOUS | Status: AC
  Administered 2015-10-23: 238 mL via INTRAVENOUS
  Filled 2015-10-23: qty 250

## 2015-10-23 MED ORDER — LORAZEPAM 2 MG/ML IJ SOLN
0.5000 mg | Freq: Every day | INTRAMUSCULAR | Status: DC | PRN
  Administered 2015-10-24: 0.5 mg via INTRAVENOUS
  Filled 2015-10-23: qty 1

## 2015-10-23 NOTE — Consult Note (Signed)
WOC wound follow up Wound type:Midline Measurement:per Monday, 10/21/15 Wound bed:red, moist granulating Drainage (amount, consistency, odor) thin, tea colored in tubing of NPWT, clear in wound bed Periwound:intact, dry, no maceration Dressing procedure/placement/frequency:1 piece of drape placed over skin on RMQ. Mepitel (2) placed over wounds as a wound contact layer, then topped with black foam (2). Proximal and distal wounds are bridged to LMQ (1 piece of foam cut into a "V" shape). Skin barrier ring placed around ileostomy and pouching system applied. Skin barrier ring (1/3 of a ring) placed from 2-5 o'clock at fistula. Convex pouching system placed over fistula. Small piece of drape applied over ileostomy pouch. System attached to 127mHg continuous negative pressure and an immediate seal is achieved. Supplies: 2 pieces Mepitel silicone wound contact layer. 3 pieces of Black Foam (granufoam). Drape   WOC wound follow up Wound type: EC Fistula, RUQ at old ileostomy site Measurement: 1 inch x 1 and 1/4 inch oval (slightly larger than Monday).  Two sutures that spanned the wound have released on one side and there is no tension. Cannot determine depth of the EC fistula Wound bed:red, moist. Drainage (amount, consistency, odor) Effluent is frothy and light brown Periwound:Intact, clear, free from maceration Dressing procedure/placement/frequency: One-third of a skin barrier ring placed on parastomal skin from 2-5 o'clock.  1-piece convex pouching system applied  WOC ostomy follow up Stoma type/location: Midline, separates proximal and distal wounds Stomal assessment/size: 100% red, moist budded.  Slightly smaller than 1 inch round Peristomal assessment: Full thickness wounds proximally and distally Treatment options for stomal/peristomal skin: NPWT plus skin barrier ring Output Small amount brown succus with frothing Ostomy pouching: 1pc.convex pouching system applied Enrolled  patient in [facility] program: Yes  Patient, husband and two representatives from [place] in session this morning in addition to patient's bedside RN, Dawn.  Detailed discussion regarding pouching and wound care regimen. Supplies and emergency procedures discussed.  WAptosNurse to provide patient and husband with instructions on what to do if the NPWT seal malfunctions during the night.  Same instructions to be sent to HPotosiNurse, P. Moore.  Total time spent in the care of this patient = 120 minutes   [place] team will follow, and will remain available to this patient, the nursing, surgical and medical teams.  Thanks, LMaudie Flakes MSN, RN, GP[place]ley CArther Abbott Pager# [phone]

## 2015-10-23 NOTE — Progress Notes (Signed)
Nutrition Follow-up  DOCUMENTATION CODES:   Obesity unspecified  INTERVENTION:   TPN cycles per Pharmacy RD to continue to monitor for needs  NUTRITION DIAGNOSIS:   Inadequate oral intake related to inability to eat, altered GI function as evidenced by NPO status.  Ongoing.  GOAL:   Patient will meet greater than or equal to 90% of their needs  Meeting with TPN.  MONITOR:   Labs, Weight trends, Skin, I & O's, Other (Comment) (TPN)  ASSESSMENT:   59 year old female presenting for a post-operative visit. 59 year old female status post total proctocolectomy with small bowel resection who presents to the office with worsening pain around her ostomy site.   Pt continues on TPN, will continue when she goes home on cyclic TPN.  Plan per Pharmacy:  Continue 14 h cyclic TPN: 38SN/KN x 1 hr (1800-1900), 149m/hr x 12 hours (1900-0700), then 557mhr (0700-0800), then off x 10 hr (0800-1800).  Lipid infusion at 17 ml/hr x 14 hours (1800-0800).  Goal: Current cyclic regimen of Clinimix E 5/15 1.7 L over 14 hr plus Fat Emulsion 20% 238 mL over 14 hr delivers: 85 gm protein, 1680 KCal/day  Labs reviewed: CBGs: 86-130 Low Na Mg/Phos WNL  Diet Order:  Diet NPO time specified Except for: Sips with Meds, Ice Chips TPN (CLINIMIX-E) Adult  Skin:  Reviewed, no issues  Last BM:  12/10  Height:   Ht Readings from Last 1 Encounters:  10/07/15 5' 3"  (1.6 m)    Weight:   Wt Readings from Last 1 Encounters:  10/07/15 196 lb 3.4 oz (89 kg)    Ideal Body Weight:  52.3 kg  BMI:  Body mass index is 34.77 kg/(m^2).  Estimated Nutritional Needs:   Kcal:  1550-1750  Protein:  75-85g  Fluid:  1.7L/day  EDUCATION NEEDS:   No education needs identified at this time  LiClayton BiblesMS, RD, LDN Pager: [phone] Hours Pager: [phone]

## 2015-10-23 NOTE — Progress Notes (Signed)
Patient ID: Latasha Wood, female   DOB: Apr 04, 1956, 59 y.o.   MRN: [id number]  [facility]   Subjective: Patient feeling better today.  Having less lower abd pain.  Hgb low level due to lab error, Repeat level stable   Objective: Vital signs in last 24 hours: Temp:  [99.1 F (37.3 C)-101.1 F (38.4 C)] 99.7 F (37.6 C) (12/14 0550) Pulse Rate:  [90-106] 93 (12/14 0550) Resp:  [16-18] 18 (12/14 0550) BP: (106-133)/(48-68) 133/66 mmHg (12/14 0550) SpO2:  [98 %-100 %] 99 % (12/14 0550) Last BM Date: 10/19/15  Intake/Output from previous day: 12/13 0701 - 12/14 0700 In: 2378 [I.V.:50; IV Piggyback:500; TPN:1828] Out: 2525 [Urine:2175; Drains:25; Stool:325] Intake/Output this shift:    Physical Exam: HEENT - sclerae clear, mucous membranes moist Neck - soft Abdomen - soft, obese; stoma viable with thin succus in bag, wound granulating well, brown tinged fluid in other ostomy bag Ext - no edema, non-tender Neuro - alert & oriented, no focal deficits  Lab Results:   Recent Labs  10/23/15 0501 10/23/15 0654  WBC 6.9 7.2  HGB 5.8* 7.4*  HCT 17.8* 22.4*  PLT 565* 529*   BMET  Recent Labs  10/22/15 0425 10/23/15 0501  NA 134* 134*  K 4.0 3.8  CL 100* 103  CO2 26 24  GLUCOSE 128* 131*  BUN 35* 27*  CREATININE 1.10* 0.76  CALCIUM 8.9 8.4*   PT/INR No results for input(s): LABPROT, INR in the last 72 hours. Comprehensive Metabolic Panel:    Component Value Date/Time   NA 134* 10/23/2015 0501   NA 134* 10/22/2015 0425   NA 139 07/31/2015 1001   K 3.8 10/23/2015 0501   K 4.0 10/22/2015 0425   CL 103 10/23/2015 0501   CL 100* 10/22/2015 0425   CO2 24 10/23/2015 0501   CO2 26 10/22/2015 0425   BUN 27* 10/23/2015 0501   BUN 35* 10/22/2015 0425   BUN 20 07/31/2015 1001   CREATININE 0.76 10/23/2015 0501   CREATININE 1.10* 10/22/2015 0425   GLUCOSE 131* 10/23/2015 0501   GLUCOSE 128* 10/22/2015 0425   GLUCOSE 86  07/31/2015 1001   CALCIUM 8.4* 10/23/2015 0501   CALCIUM 8.9 10/22/2015 0425   AST 31 10/23/2015 0501   AST 57* 10/21/2015 0142   ALT 46 10/23/2015 0501   ALT 87* 10/21/2015 0142   ALKPHOS 101 10/23/2015 0501   ALKPHOS 125 10/21/2015 0142   BILITOT 0.6 10/23/2015 0501   BILITOT 0.3 10/21/2015 0142   BILITOT 0.4 07/31/2015 1001   PROT 6.1* 10/23/2015 0501   PROT 6.5 10/21/2015 0142   PROT 6.7 07/31/2015 1001   ALBUMIN 2.3* 10/23/2015 0501   ALBUMIN 2.4* 10/21/2015 0142   ALBUMIN 3.9 07/31/2015 1001    Studies/Results: No results found.  Anti-infectives: Anti-infectives    Start     Dose/Rate Route Frequency Ordered Stop   10/07/15 1700  [MAR Hold]  ertapenem (INVANZ) 1 g in sodium chloride 0.9 % 50 mL IVPB  Status:  Discontinued     (MAR Hold since 10/08/15 1224)   1 g 100 mL/hr over 30 Minutes Intravenous Every 24 hours 10/07/15 1643 10/08/15 1809      Assessment & Plans: Necrosis of ileostomy Cont TPN, looks like she has a fistula. ~465m/day. Output decreased some with NPO. Cycling TPN for home use.  Cr better, will recheck off daily fluids in the AM  Fever: Will get UA today.  WBC stable, none  in the last 24 hrs OOB, ambulate Vac change MWF Benicar for HTN, will hold for now  Cont PO pain meds as needed  Tylenol as needed for fevers  Acute on chronic blood loss anemia: Hgb stable.  I see no signs of active bleeding currently Ambulate   Baird Polinski C. 02/30/1720

## 2015-10-23 NOTE — Progress Notes (Signed)
Pt's HGB result called from lab- 5.8. Dr. Excell Seltzer notified.  Order rec'd to transfuse 2 u prbc.  Pt instructed to call before getting oob.  No c/o from pt regarding dizziness or lightheadness

## 2015-10-23 NOTE — Progress Notes (Signed)
PARENTERAL NUTRITION CONSULT NOTE - FOLLOW-UP  Pharmacy Consult for TPN Indication: protein-calorie malnutrition; prolonged NPO status secondary to necrosis of  Ileostomy with fistula  No Known Allergies  Patient Measurements: Height: 5' 3"  (160 cm) Weight: 196 lb 3.4 oz (89 kg) IBW/kg (Calculated) : 52.4  Vital Signs: Temp: 99.7 F (37.6 C) (12/14 0550) Temp Source: Oral (12/14 0550) BP: 133/66 mmHg (12/14 0550) Pulse Rate: 93 (12/14 0550) Intake/Output from previous day: 12/13 0701 - 12/14 0700 In: 510 [I.V.:10; IV Piggyback:500] Out: 2525 [Urine:2175; Drains:25; Stool:325] Intake/Output from this shift:    Labs:  Recent Labs  10/22/15 1005 10/23/15 0501 10/23/15 0654  WBC 8.1 6.9 7.2  HGB 7.4* 5.8* 7.4*  HCT 22.9* 17.8* 22.4*  PLT 582* 565* 529*    Recent Labs  10/21/15 0141 10/21/15 0142 10/22/15 0425 10/23/15 0501  NA  --  136 134* 134*  K  --  3.6 4.0 3.8  CL  --  100* 100* 103  CO2  --  27 26 24   GLUCOSE  --  115* 128* 131*  BUN  --  29* 35* 27*  CREATININE  --  0.86 1.10* 0.76  CALCIUM  --  8.8* 8.9 8.4*  MG  --  2.1  --  2.0  PHOS  --  4.1  --  3.6  PROT  --  6.5  --  6.1*  ALBUMIN  --  2.4*  --  2.3*  AST  --  57*  --  31  ALT  --  87*  --  46  ALKPHOS  --  125  --  101  BILITOT  --  0.3  --  0.6  PREALBUMIN 25.3  --   --   --   TRIG  --  229* 224*  --    Estimated Creatinine Clearance: 79.1 mL/min (by C-G formula based on Cr of 0.76).    Recent Labs  10/21/15 1452 10/22/15 0917 10/22/15 1503  GLUCAP 86 96 86   Insulin Requirements/ 24hr:  2 units at 8pm  Current Nutrition:  NPO Clinimix-E 5/15, 1.7 L/day as cyclic regimen over 14 hours Fat Emulsion 20%, 448 mL/day as cyclic regimen over 14 hours  IVF: saline lock  Central access: PICC line placed 10/09/15 TPN start date: 10/09/15   ASSESSMENT                                                                                                          HPI:  83 y/oF with hx  ulcerative colitis s/p total abdominal proctectomy 09/25/15 presented to MD office 11/28 with frank necrosis of her ileostomy and was subsequently admitted to [facility]. Patient underwent ileostomy revision and repair of small bowel enterotomy on 11/29. Pharmacy asked to start TPN for this patient due to protein calorie malnutrition from recent surgery and chronic illness until her bowel function resolves.   Significant events:  11/30: Patient pulled NG tube out overnight 12/2: Per surgery, anticipate TPN at least  through Monday 12/5 12/5: continue TPN per surgery, may have clears for  comfort 12/6: some ostomy output and some air in bag 12/8: 25cc in ostomy bag.  Likely new fistula developing.  Tolerating clears. 12/9:  NPO except ice chips, sips with meds. Start cycling TPN. 12/10: Prednisone discontinued, received this am. 12/12: Fistula output 425 mL/24 hr 12/13 Fever, soft BP, UOP reduced, Fistula output decreased.  IVF (500 mL bolus + 75 ml/hr for additional 5 hours), no further cyclic consolidation.  Today, 10/23/2015  Glucose - CBGs at goal (< 150). History of type 2 DM but not requiring any meds for this PTA (last Hemoglobin A1c 6.6%).  Electrolytes - Na low; K, Mag, Phos, CorrCa 9.76 remain WNL  Renal - SCr improved after bump on 12/13 (0.86 > 1.1 > 0.76) with additional IVF given during the day.  UOP improved (1.01 ml/kg/hr)  LFTs - AST and ALT increased 12/12, but improved today.  The recent conversion to cyclic administration may help limit increase in transaminases.  TGs - improving: 82 (11/30), 166 (12/5), 229 (12/12), 224 (12/13), 207 (12/14).  Not necessary to remove Fat Emulsion from regimen unless TG exceeds 400.   Prealbumin - improved, now WNL:  11.2 (12/5), 25.3 (12/12)  NUTRITIONAL GOALS                                                                                    RD recs: 1550-1750 Kcal/day, 75-85g Protein/day, 1.7L fluid/day  Current cyclic regimen of Clinimix E  5/15 1.7 L over 14 hr plus Fat Emulsion 20% 238 mL over 14 hr delivers: 85 gm protein, 1680 KCal/day  PLAN                                                                                                                  Continue 14 h cyclic TPN: 49QP/RF x 1 hr (1800-1900), 14m/hr x 12 hours (1900-0700), then 515mhr (0700-0800), then off x 10 hr (0800-1800).  Lipid infusion at 17 ml/hr x 14 hours (1800-0800).  TPN to contain standard multivitamins and trace elements.  Check CBGs 2 hours past cyclic TPN start, 1 hr past cyclic TPN d/c, and while off TPN (total 3 CBGs/24h period).  Routine TPN lab panels on Mondays & Thursdays.  After discharge, TPN may be further condensed to cycle over 12 hours, and additional fluids added.  MD recommends adding 500 mL extra IVF to TPN when discharged.  We will hold off on additional IVF today per MD request and re-assess labs and BP in AM.  ChGretta ArabharmD, BCPS Pager [phone] 94422/14/2016 7:35 AM

## 2015-10-23 NOTE — Progress Notes (Signed)
PT Cancellation Note / Sign off  Patient Details Name: Latasha Wood MRN: [id number] DOB: 08-12-1956   Cancelled Treatment:    Reason Eval/Treat Not Completed: Other (comment) Pt reports she just ambulated around unit and is doing well.  Pt declined any further PT today and states she is supposed to d/c home tomorrow.  Pt had no questions about HEP.  PT to sign off.   Quitman Norberto,KATHrine E 10/23/2015, 11:46 AM Carmelia Bake, PT, DPT 10/23/2015 Pager: [phone]

## 2015-10-24 LAB — COMPREHENSIVE METABOLIC PANEL
ALBUMIN: 2.3 g/dL — AB (ref 3.5–5.0)
ALK PHOS: 104 U/L (ref 38–126)
ALT: 42 U/L (ref 14–54)
ANION GAP: 8 (ref 5–15)
AST: 26 U/L (ref 15–41)
BUN: 28 mg/dL — ABNORMAL HIGH (ref 6–20)
CALCIUM: 8.3 mg/dL — AB (ref 8.9–10.3)
CHLORIDE: 105 mmol/L (ref 101–111)
CO2: 25 mmol/L (ref 22–32)
Creatinine, Ser: 0.9 mg/dL (ref 0.44–1.00)
GFR calc Af Amer: >60 mL/min (ref >60)
GFR calc non Af Amer: >60 mL/min (ref >60)
GLUCOSE: 131 mg/dL — AB (ref 65–99)
Potassium: 3.9 mmol/L (ref 3.5–5.1)
SODIUM: 138 mmol/L (ref 135–145)
Total Bilirubin: 0.3 mg/dL (ref 0.3–1.2)
Total Protein: 6 g/dL — ABNORMAL LOW (ref 6.5–8.1)

## 2015-10-24 LAB — GLUCOSE, CAPILLARY: GLUCOSE-CAPILLARY: 106 mg/dL — AB (ref 65–99)

## 2015-10-24 LAB — PHOSPHORUS: Phosphorus: 3.7 mg/dL (ref 2.5–4.6)

## 2015-10-24 LAB — IRON AND TIBC
Iron: 15 ug/dL — ABNORMAL LOW (ref 28–170)
Saturation Ratios: 6 % — ABNORMAL LOW (ref 10.4–31.8)
TIBC: 237 ug/dL — ABNORMAL LOW (ref 250–450)
UIBC: 222 ug/dL

## 2015-10-24 LAB — MAGNESIUM: Magnesium: 2 mg/dL (ref 1.7–2.4)

## 2015-10-24 LAB — FERRITIN: FERRITIN: 154 ng/mL (ref 11–307)

## 2015-10-24 MED ORDER — HEPARIN SOD (PORK) LOCK FLUSH 100 UNIT/ML IV SOLN
250.0000 [IU] | INTRAVENOUS | Status: DC | PRN
  Administered 2015-10-24: 500 [IU]

## 2015-10-24 MED ORDER — ONDANSETRON 4 MG PO TBDP: 4.0000 mg | ORAL_TABLET | Freq: Four times a day (QID) | ORAL | Status: DC | PRN

## 2015-10-24 MED ORDER — HYDROCORTISONE 1 % EX CREA: TOPICAL_CREAM | CUTANEOUS | Status: DC | PRN

## 2015-10-24 MED ORDER — OXYCODONE HCL 5 MG/5ML PO SOLN: 5.0000 mg | ORAL | Status: DC | PRN

## 2015-10-24 MED ORDER — SODIUM CHLORIDE 0.9 % IV SOLN
510.0000 mg | Freq: Once | INTRAVENOUS | Status: AC
  Administered 2015-10-24: 510 mg via INTRAVENOUS
  Filled 2015-10-24: qty 17

## 2015-10-24 MED ORDER — FAMOTIDINE 20 MG PO TABS: 20.0000 mg | ORAL_TABLET | Freq: Two times a day (BID) | ORAL | Status: DC

## 2015-10-24 MED ORDER — HEPARIN SOD (PORK) LOCK FLUSH 100 UNIT/ML IV SOLN: 250.0000 [IU] | INTRAVENOUS | Status: DC | PRN

## 2015-10-24 NOTE — Discharge Summary (Signed)
Physician Discharge Summary  Patient ID: Latasha Wood MRN: [id number] DOB/AGE: April 16, 1956 59 y.o.  Admit date: 10/07/2015 Discharge date: 10/24/2015  Admission Diagnoses: ileostomy necrosis  Discharge Diagnoses:  Principal Problem:   Necrosis of ileostomy ([place])   Discharged Condition: stable  Hospital Course: 59 y.o. F who presented to the office ~3 weeks from surgery with necrosis of her ileostomy.  She was taken to the OR the following day and necrosis was noted down to the level of the fascia.  She required a laparotomy to mobilize a portion of bowel up to her midline.  She remained in the hospital on TPN.  On POD 8 she developed a low output fistula.  She was made NPO and her TPN was cycled.  Once her vitals and output were stable, she was discharged to home with home health for wound care and TPN while we allow her fistula to heal.  Consults: WOC  Significant Diagnostic Studies: labs: cbc, chemistries  Treatments: IV hydration, analgesia: acetaminophen w/ codeine and surgery: see aboved  Discharge Exam: Blood pressure 116/58, pulse 97, temperature 98.4 F (36.9 C), temperature source Oral, resp. rate 18, height 5' 3"  (1.6 m), weight 89 kg (196 lb 3.4 oz), SpO2 100 %. General appearance: alert and cooperative GI: soft, notender Incision/Wound: RLQ wound open and draining purulence and bile tinged fluid, midline wound vac in place.  Disposition: 01-Home or Self Care     Medication List    STOP taking these medications        aspirin 81 MG chewable tablet     BENICAR HCT 40-12.5 MG tablet  Generic drug:  olmesartan-hydrochlorothiazide     multivitamin with minerals Tabs tablet     oxyCODONE-acetaminophen 5-325 MG tablet  Commonly known as:  PERCOCET/ROXICET     potassium chloride SA 20 MEQ tablet  Commonly known as:  K-DUR,KLOR-CON     predniSONE 10 MG tablet  Commonly known as:  DELTASONE      TAKE these medications        acetaminophen 650 MG CR  tablet  Commonly known as:  TYLENOL  Take 650 mg by mouth every 8 (eight) hours as needed for pain.     ALPRAZolam 0.25 MG tablet  Commonly known as:  XANAX  Take 1 tablet (0.25 mg total) by mouth 2 (two) times daily as needed for anxiety.     famotidine 20 MG tablet  Commonly known as:  PEPCID  Take 1 tablet (20 mg total) by mouth 2 (two) times daily.     hydrocortisone cream 1 %  Apply topically as needed for itching.     ondansetron 4 MG disintegrating tablet  Commonly known as:  ZOFRAN-ODT  Take 1 tablet (4 mg total) by mouth every 6 (six) hours as needed for nausea.     oxyCODONE 5 MG/5ML solution  Commonly known as:  ROXICODONE  Take 5-10 mLs (5-10 mg total) by mouth every 4 (four) hours as needed for moderate pain.           Follow-up Information    Follow up with Rosario Adie., MD. Schedule an appointment as soon as possible for a visit in 3 weeks.   Specialty:  General Surgery   Contact information:   [place] [address] [phone]       Signed: Rosario Adie 86/57/8469, 11:51 AM

## 2015-10-24 NOTE — Care Management Note (Signed)
Case Management Note  Patient Details  Name: Latasha Wood MRN: [id number] Date of Birth: 1956/10/22  Subjective/Objective:         S/p ostomy revision           Action/Plan: Discharge planning, previously active with [facility] for RN. Wound Vac arranged with [employer]. Plan for Vac change on Monday, TNA teaching done, cyclic infusion planned per [facility] protocol.   Expected Discharge Date:                  Expected Discharge Plan:  [place]  In-House Referral:  NA  Discharge planning Services  CM Consult  Post Acute Care Choice:  Home Health, Resumption of Svcs/PTA Provider Choice offered to:  Patient  DME Arranged:  Vac, IV pump/equipment DME Agency:  [place] [employer]  HH Arranged:  RN, Disease Management [place] Agency:  [facility]  Status of Service:  Completed, signed off  Medicare Important Message Given:    Date Medicare IM Given:    Medicare IM give by:    Date Additional Medicare IM Given:    Additional Medicare Important Message give by:     If discussed at [place] of Stay Meetings, dates discussed:    Additional Comments:  Guadalupe Maple, RN 10/24/2015, 3:48 PM

## 2015-10-24 NOTE — Consult Note (Signed)
WOC wound consult note Reason for Consult:Visit to oversee disconnection of [employer] NPWT and transfer to [employer] NPWT Wound type: Ileostomy/Fistula/Full thickness wound [facility] Coordinator Santiago Glad) attaches to [employer] pump after this Probation officer cuts away [employer]. Pad.  No seal attained.  Additional drape applied to peri-suction port and still no seal achieved.  Patient's husband requests that they be allowed to remain on [employer] NPWT.  This writer cuts away Medela Suction port, places drape over area and cuts a new opening into drape, affixes [employer] T.R.A.C. Pad over opening.  No seal attained. Decision made to replace entire system for patient's concern about system leakage.  WOC wound follow up Wound type:Midline Measurement:per Monday Wound LYY:TKPTW, redoist Drainage (amount, consistency, odor) clear Periwound:intact, dry Dressing procedure/placement/frequency: 1 piece of drape placed over skin on RMQ. Mepitel (2) placed over wounds as a wound contact layer, then topped with black foam (2). Proximal and distal wounds are bridged to LMQ (1 piece of foam cut into a "V" shape). Skin barrier ring placed around ileostomy and pouching system applied. Skin barrier ring (flattened) around fistula. Convex pouching system placed over fistula. Small piece of drape applied over ileostomy pouch. System attached to 115mHg continuous negative pressure and an immediate seal is achieved. Supplies: 2 pieces Mepitel silicone wound contact layer. 3 pieces of Black Foam (granufoam). Drape.   WOC wound follow up Wound type: EC Fistula, RUQ at old ileostomy site Measurement: 1 inch x 1 and 1/4 inch oval (slightly larger than Monday). Two sutures that spanned the wound have released on one side and there is no tension. Cannot determine depth of the EC fistula Wound bed:red, moist. Drainage (amount, consistency, odor) Effluent is frothy and light brown Periwound:Intact, clear, free from maceration Dressing  procedure/placement/frequency: One skin barrier ring placed on para-fistula skin. 1-piece convex pouching system    WOC ostomy follow up Stoma type/location: Midline, separates proximal and distal wounds Stomal assessment/size: 100% red, moist budded. Slightly smaller than 1 inch round Peristomal assessment: Full thickness wounds proximally and distally Treatment options for stomal/peristomal skin: NPWT plus skin barrier ring Output Small amount brown succus with frothing Ostomy pouching: 1pc.convex pouching system applied Enrolled patient in [facility]charge program: Yes  I am assisted in today's dressing change by RSchering-Plough Dr.Thomas is aware that next dressing change is not due until Monday, 10/28/15 and agrees to modification in POC due to unplanned dressing changes this afternoon.  This wProbation officercommunicates this information to ABrownstownand AJeff[facility] YardWBrazos BendNurse PRudi Coco Patient is connected to [employer] NPWT home device and instructed on battery use, how to disconnect, charge, etc.  Patient is preparing for discharge.  Supplies provided for a minimum of two weeks (6 dressing changes) as well as for "in case of emergency" saline dressings until the ABrightiside Surgicalnurse can arrive should the system malfunction or the seal become compromised.  [place] Beachnursing team  but will remain available to this patient, the nursing, surgical and medial teams.  Thanks, LMaudie Flakes MSN, RN, GIngleside on the Bay CArther Abbott Pager# [phone]

## 2015-10-24 NOTE — Progress Notes (Signed)
Discharge instructions given to patient along with prescriptions.  Questions answered 

## 2015-10-24 NOTE — Progress Notes (Signed)
PARENTERAL NUTRITION CONSULT NOTE - FOLLOW-UP  Pharmacy Consult for TPN Indication: protein-calorie malnutrition; prolonged NPO status secondary to necrosis of  Ileostomy with fistula  No Known Allergies  Patient Measurements: Height: 5' 3"  (160 cm) Weight: 196 lb 3.4 oz (89 kg) IBW/kg (Calculated) : 52.4  Vital Signs: Temp: 98.4 F (36.9 C) (12/15 0439) Temp Source: Oral (12/15 0439) BP: 116/58 mmHg (12/15 0439) Pulse Rate: 97 (12/15 0439) Intake/Output from previous day: 12/14 0701 - 12/15 0700 In: 2084 [P.O.:60; TPN:2024] Out: 1145 [Urine:1050; Stool:95]  Labs:  Recent Labs  10/22/15 1005 10/23/15 0501 10/23/15 0654  WBC 8.1 6.9 7.2  HGB 7.4* 5.8* 7.4*  HCT 22.9* 17.8* 22.4*  PLT 582* 565* 529*    Recent Labs  10/22/15 0425 10/23/15 0501 10/24/15 0340  NA 134* 134* 138  K 4.0 3.8 3.9  CL 100* 103 105  CO2 26 24 25   GLUCOSE 128* 131* 131*  BUN 35* 27* 28*  CREATININE 1.10* 0.76 0.90  CALCIUM 8.9 8.4* 8.3*  MG  --  2.0 2.0  PHOS  --  3.6 3.7  PROT  --  6.1* 6.0*  ALBUMIN  --  2.3* 2.3*  AST  --  31 26  ALT  --  46 42  ALKPHOS  --  101 104  BILITOT  --  0.6 0.3  TRIG 224* 207*  --    Estimated Creatinine Clearance: 70.3 mL/min (by C-G formula based on Cr of 0.9).    Recent Labs  10/23/15 0855 10/23/15 1418 10/23/15 2005  GLUCAP 121* 98 134*   Insulin Requirements/ 24hr:  2 units at 9pm  Current Nutrition:  NPO Clinimix-E 5/15, 1.7 L/day as cyclic regimen over 14 hours Fat Emulsion 20%, 947 mL/day as cyclic regimen over 14 hours  IVF: saline lock  Central access: PICC line placed 10/09/15 TPN start date: 10/09/15   ASSESSMENT                                                                                                          HPI:  24 y/oF with hx ulcerative colitis s/p total abdominal proctectomy 09/25/15 presented to MD office 11/28 with frank necrosis of her ileostomy and was subsequently admitted to [facility]. Patient underwent  ileostomy revision and repair of small bowel enterotomy on 11/29. Pharmacy asked to start TPN for this patient due to protein calorie malnutrition from recent surgery and chronic illness until her bowel function resolves.   Significant events:  11/30: Patient pulled NG tube out overnight 12/2: Per surgery, anticipate TPN at least  through Monday 12/5 12/5: continue TPN per surgery, may have clears for comfort 12/6: some ostomy output and some air in bag 12/8: 25cc in ostomy bag.  Likely new fistula developing.  Tolerating clears. 12/9:  NPO except ice chips, sips with meds. Start cycling TPN. 12/10: Prednisone discontinued, received this am. 12/12: Fistula output 425 mL/24 hr 12/13 Fever, soft BP, UOP reduced, Fistula output decreased.  IVF (500 mL bolus + 75 ml/hr for additional 5 hours), no further cyclic consolidation. 12/15  BP and temperature improved.  Planning discharge today.  Today, 10/24/2015  Glucose - CBGs at goal (< 150). History of type 2 DM but not requiring any meds for this PTA (last Hemoglobin A1c 6.6%).  Electrolytes - Na,. K, Mag, Phos, CorrCa 9.76 remain WNL  Renal - SCr increased slightly (0.86 > 1.1 > 0.76 > 0.9) with additional IVF held yesterday.  UOP adequate (0.49 ml/kg/hr)  LFTs - AST and ALT increased 12/12, but improved today.  The recent conversion to cyclic administration may help limit increase in transaminases.  TGs - improving: 82 (11/30), 166 (12/5), 229 (12/12), 224 (12/13), 207 (12/14).  Not necessary to remove Fat Emulsion from regimen unless TG exceeds 400.   Prealbumin - improved, now WNL:  11.2 (12/5), 25.3 (12/12)  NUTRITIONAL GOALS                                                                                    RD recs: 1550-1750 Kcal/day, 75-85g Protein/day, 1.7L fluid/day  Current cyclic regimen of Clinimix E 5/15 1.7 L over 14 hr plus Fat Emulsion 20% 238 mL over 14 hr delivers: 85 gm protein, 1680 KCal/day  PLAN                                                                                                                  Continue cyclic TPN per [place].  Most recent inpatient regimen was 14 hour cycle:  82m/hr x 1 hr (1800-1900), 1367mhr x 12 hours (1900-0700), then 5020mr (0700-0800), then off x 10 hr (0800-1800).  Lipid infusion at 17 ml/hr x 14 hours (1800-0800).  TPN to contain standard multivitamins and trace elements.  Check CBGs 2 hours past cyclic TPN start, 1 hr past cyclic TPN d/c, and while off TPN (total 3 CBGs/24h period).  Routine TPN lab panels on Mondays & Thursdays.  After discharge, TPN may be further condensed to cycle over 12 hours, and additional fluids added.  MD recommends adding 500 mL extra IVF to TPN when discharged.  ChrGretta ArabarmD, BCPS Pager [phone]/15/2016 7:34 AM

## 2015-10-24 NOTE — Discharge Instructions (Signed)
GENERAL SURGERY: POST OP INSTRUCTIONS  1. DIET: Continue nothing by mouth  2. Take your prescribed home medications unless otherwise directed.  See discharge paperwork for which medications to stop and which ones to take. 3. PAIN CONTROL: a. Pain is best controlled by a usual combination of three different methods TOGETHER: i. Ice/Heat ii. Over the counter pain medication iii. Prescription pain medication b. Most patients will experience some swelling and bruising around the incisions.  Ice packs or heating pads (30-60 minutes up to 6 times a day) will help. Use ice for the first few days to help decrease swelling and bruising, then switch to heat to help relax tight/sore spots and speed recovery.  Some people prefer to use ice alone, heat alone, alternating between ice & heat.  Experiment to what works for you.  Swelling and bruising can take several weeks to resolve.   c. It is helpful to take an over-the-counter pain medication regularly for the first few weeks.  Choose one of the following that works best for you: i. Naproxen (Aleve, etc)  Two 259m tabs twice a day ii. Ibuprofen (Advil, etc) Three 2066mtabs four times a day (every meal & bedtime) d. A  prescription for pain medication (such as Percocet, oxycodone, hydrocodone, etc) should be given to you upon discharge.  Take your pain medication as prescribed.  i. If you are having problems/concerns with the prescription medicine (does not control pain, nausea, vomiting, rash, itching, etc), please call [phone] see if we need to switch you to a different pain medicine that will work better for you and/or control your side effect better. ii. If you need a refill on your pain medication, please contact your pharmacy.  They will contact our office to request authorization. Prescriptions will not be filled after 5 pm or on week-ends.   4. Wash / shower every day.  You may shower over the dressings as they are waterproof.  Continue to  shower over incision(s) after the dressing is off. 5. Home health will change your wound vac.  Call the office if there are any concerns with this.  6. ACTIVITIES as tolerated:   a. You may resume regular (light) daily activities beginning the next day--such as daily self-care, walking, climbing stairs--gradually increasing activities as tolerated.  If you can walk 30 minutes without difficulty, it is safe to try more intense activity such as jogging, treadmill, bicycling, low-impact aerobics, swimming, etc. b. Save the most intensive and strenuous activity for last such as sit-ups, heavy lifting, contact sports, etc  Refrain from any heavy lifting or straining until you are off narcotics for pain control.   c. DO NOT PUSH THROUGH PAIN.  Let pain be your guide: If it hurts to do something, don't do it.  Pain is your body warning you to avoid that activity for another week until the pain goes down. d. You may drive when you are no longer taking prescription pain medication, you can comfortably wear a seatbelt, and you can safely maneuver your car and apply brakes. e. YoDennis Bastay have sexual intercourse when it is comfortable.  7. FOLLOW UP in our office a. Please call [facility] at [phone] to set up an appointment to see your surgeon in the office for a follow-up appointment approximately 2-3 weeks after your surgery. b. Make sure that you call for this appointment the day you arrive home to insure a convenient appointment time. 9. IF YOU HAVE DISABILITY OR FAMILY LEAVE  FORMS, BRING THEM TO THE OFFICE FOR PROCESSING.  DO NOT GIVE THEM TO YOUR DOCTOR.   WHEN TO CALL us [phone]: 1. Poor pain control 2. Reactions / problems with new medications (rash/itching, nausea, etc)  3. Fever over 101.5 F (38.5 C) 4. Worsening swelling or bruising 5. Continued bleeding from incision. 6. Increased pain, redness, or drainage from the incision   The clinic staff is available to answer your questions during  regular business hours (8:30am-5pm).  Please dont hesitate to call and ask to speak to one of our nurses for clinical concerns.   If you have a medical emergency, go to the nearest emergency room or call 911.  A surgeon from [facility] is always on call at the [facility], [place], [place], [place], [place]  [address] ? MAIN: [phone] ? TOLL FREE: [phone] ?  FAX [phone] [email or web address]

## 2015-10-26 LAB — TYPE AND SCREEN
ABO/RH(D): B POS
ANTIBODY SCREEN: NEGATIVE
Unit division: 0
Unit division: 0

## 2015-11-01 ENCOUNTER — Encounter (HOSPITAL_COMMUNITY): Payer: Self-pay

## 2015-11-01 ENCOUNTER — Emergency Department (HOSPITAL_COMMUNITY)
Admission: EM | Admit: 2015-11-01 | Discharge: 2015-11-01 | Disposition: A | Discharge status: Home / Self Care | Payer: 59 | Attending: Emergency Medicine | Admitting: Emergency Medicine

## 2015-11-01 DIAGNOSIS — Z434 Encounter for attention to other artificial openings of digestive tract: Secondary | ICD-10-CM | POA: Insufficient documentation

## 2015-11-01 DIAGNOSIS — D649 Anemia, unspecified: Secondary | ICD-10-CM | POA: Insufficient documentation

## 2015-11-01 DIAGNOSIS — Z79899 Other long term (current) drug therapy: Secondary | ICD-10-CM | POA: Diagnosis not present

## 2015-11-01 DIAGNOSIS — Z8719 Personal history of other diseases of the digestive system: Secondary | ICD-10-CM | POA: Diagnosis not present

## 2015-11-01 DIAGNOSIS — Z794 Long term (current) use of insulin: Secondary | ICD-10-CM | POA: Diagnosis not present

## 2015-11-01 DIAGNOSIS — I10 Essential (primary) hypertension: Secondary | ICD-10-CM | POA: Insufficient documentation

## 2015-11-01 DIAGNOSIS — Z86010 Personal history of colonic polyps: Secondary | ICD-10-CM | POA: Diagnosis not present

## 2015-11-01 DIAGNOSIS — Z7189 Other specified counseling: Secondary | ICD-10-CM

## 2015-11-01 DIAGNOSIS — E119 Type 2 diabetes mellitus without complications: Secondary | ICD-10-CM | POA: Diagnosis not present

## 2015-11-01 DIAGNOSIS — R799 Abnormal finding of blood chemistry, unspecified: Secondary | ICD-10-CM | POA: Diagnosis present

## 2015-11-01 DIAGNOSIS — D6489 Other specified anemias: Secondary | ICD-10-CM

## 2015-11-01 LAB — I-STAT CG4 LACTIC ACID, ED: LACTIC ACID, VENOUS: 0.6 mmol/L (ref 0.5–2.0)

## 2015-11-01 LAB — I-STAT CHEM 8, ED
BUN: 21 mg/dL — AB (ref 6–20)
CHLORIDE: 102 mmol/L (ref 101–111)
Calcium, Ion: 1.18 mmol/L (ref 1.13–1.30)
Creatinine, Ser: 0.8 mg/dL (ref 0.44–1.00)
Glucose, Bld: 74 mg/dL (ref 65–99)
HEMATOCRIT: 22 % — AB (ref 36.0–46.0)
Hemoglobin: 7.5 g/dL — ABNORMAL LOW (ref 12.0–15.0)
POTASSIUM: 3.6 mmol/L (ref 3.5–5.1)
SODIUM: 138 mmol/L (ref 135–145)
TCO2: 27 mmol/L (ref 0–100)

## 2015-11-01 LAB — CBC WITH DIFFERENTIAL/PLATELET
BASOS ABS: 0 10*3/uL (ref 0.0–0.1)
BASOS PCT: 0 %
EOS ABS: 0.2 10*3/uL (ref 0.0–0.7)
Eosinophils Relative: 3 %
HCT: 24.1 % — ABNORMAL LOW (ref 36.0–46.0)
HEMOGLOBIN: 7.8 g/dL — AB (ref 12.0–15.0)
Lymphocytes Relative: 30 %
Lymphs Abs: 2.1 10*3/uL (ref 0.7–4.0)
MCH: 28.6 pg (ref 26.0–34.0)
MCHC: 32.4 g/dL (ref 30.0–36.0)
MCV: 88.3 fL (ref 78.0–100.0)
MONOS PCT: 9 %
Monocytes Absolute: 0.6 10*3/uL (ref 0.1–1.0)
NEUTROS PCT: 58 %
Neutro Abs: 4.1 10*3/uL (ref 1.7–7.7)
Platelets: 398 10*3/uL (ref 150–400)
RBC: 2.73 MIL/uL — ABNORMAL LOW (ref 3.87–5.11)
RDW: 14.5 % (ref 11.5–15.5)
WBC: 7.1 10*3/uL (ref 4.0–10.5)

## 2015-11-01 LAB — TYPE AND SCREEN
ABO/RH(D): B POS
ANTIBODY SCREEN: NEGATIVE

## 2015-11-01 LAB — PROTIME-INR
INR: 1.16 (ref 0.00–1.49)
PROTHROMBIN TIME: 15 s (ref 11.6–15.2)

## 2015-11-01 MED ORDER — CEPHALEXIN 500 MG PO CAPS: 1000.0000 mg | ORAL_CAPSULE | Freq: Two times a day (BID) | ORAL | Status: DC

## 2015-11-01 NOTE — ED Notes (Signed)
Wound vac in place on the upper abdomen. No complications at this time. Pt is concerned that the colostomy bag is leaking. Mild Vicci Reder noted. Denies sob. States that home health has been working continuously to resolve the issue. Pt is aware of hgb being low.

## 2015-11-01 NOTE — Discharge Instructions (Signed)
Please take antibiotic as treatment for skin infection on your abdomen.  Continue to receive wound care from your home health nurse.  Your hemoglobin is 7.8 today, which is stable.  Return to ER if you develop worsening pain, increase lightheadedness, fever not resolved with tylenol or if you have other concerns.

## 2015-11-01 NOTE — ED Provider Notes (Signed)
CSN: [id number]     Arrival date & time 11/01/15  1806 History   First MD Initiated Contact with Patient 11/01/15 1819     Chief Complaint  Patient presents with  . Abnormal Lab     (Consider location/radiation/quality/duration/timing/severity/associated sxs/prior Treatment) HPI  59 year old female with history of ulcerative colitis, hypertension, anemia, diabetes presents for evaluation of low blood count.  Initial report on November 16th pt undergo surgical ileostomy due to widespread ulcerative colitis. Pt has complications including surgical repair a month ago due to necrosis of her ileostomy which subsequently form a low output fistula. Pt recently discharged from [facility] on 12/15. Pt has homehealth nurse to provide wound care including wound vac.  Due to the location of her ostomies it has been difficult to create adequate vacuum from the wound vac.  She reports recurrent leakage from her ostomy site. She also reports intermittent fevers and chills but no worsening abdominal pain. She was notified that hemoglobin is 7.4 today. She denies any increased lightheadedness or dizziness. For the past several days several home health nurse has attempted to change dressing and control the placement of the wound back without adequate seal. Therefore patient presents to the ED today requesting for further wound care management.  Patient has been taking Tylenol for pain and fever. Reported MAXIMUM TEMPERATURE of 100.7. She denies nausea vomiting diarrhea. She will receive nutrition through TPN.  No c/o CP, or SOB.      Past Medical History  Diagnosis Date  . Hypertension   . Colitis, ulcerative ([place])   . Hyperlipidemia   . Colon polyps   . Blood transfusion   . Anemia   . Shortness of breath dyspnea     talking or walking   . Type 2 diabetes mellitus with complication, without long-term current use of insulin ([place]) 09/05/2015    currently on no medications   . Numbness and tingling    hands and feet bilat    Past Surgical History  Procedure Laterality Date  . Gravida 6 para 2      all SAB  . Hyadiform mole    . Dilation and curettage of uterus      most likely after miscarriage  . Knee arthroscopy w/ meniscal repair      right knee ([place])  . Fibrocystic breast disease      q 6 month mammogram  . Abdominal hysterectomy  2000  . Small bowel obstruction    . Robotic assisted laparoscopic lysis of adhesion N/A 09/25/2015    Procedure: XI ROBOTIC ASSISTED LAPAROSCOPIC LYSIS OF ADHESION;  Surgeon: Leighton Ruff, MD;  Location: [facility];  Service: General;  Laterality: N/A;  90 minutes  . Laparoscopic small bowel resection N/A 09/25/2015    Procedure: LAPAROSCOPIC BOWEL RESECTION TIMES TWO;  Surgeon: Leighton Ruff, MD;  Location: [facility];  Service: General;  Laterality: N/A;  . Ileo loop diversion N/A 09/25/2015    Procedure: ILEO LOOP COLOSTOMY;  Surgeon: Leighton Ruff, MD;  Location: [facility];  Service: General;  Laterality: N/A;  . Ileostomy closure N/A 10/08/2015    Procedure: ILEOSTOMY REVISION;  Surgeon: Leighton Ruff, MD;  Location: [facility];  Service: General;  Laterality: N/A;   Family History  Problem Relation Age of Onset  . Diabetes Mother   . Breast cancer Mother 18  . Diabetes Father   . Heart disease Father   . Diabetes Sister   . Colon cancer Paternal Aunt   . Diabetes Brother   .  Breast cancer Sister 63   Social History  Substance Use Topics  . Smoking status: Never Smoker   . Smokeless tobacco: Never Used  . Alcohol Use: 1.0 oz/week    2 Standard drinks or equivalent per week     Comment: occ   OB History    No data available     Review of Systems  All other systems reviewed and are negative.     Allergies  Review of patient's allergies indicates no known allergies.  Home Medications   Prior to Admission medications   Medication Sig Start Date End Date Taking? Authorizing Provider  acetaminophen (TYLENOL) 650 MG CR tablet Take 650  mg by mouth every 8 (eight) hours as needed for pain.    Historical Provider, MD  ALPRAZolam Duanne Moron) 0.25 MG tablet Take 1 tablet (0.25 mg total) by mouth 2 (two) times daily as needed for anxiety. 09/05/15   Janith Lima, MD  famotidine (PEPCID) 20 MG tablet Take 1 tablet (20 mg total) by mouth 2 (two) times daily. 82/42/35   Leighton Ruff, MD  hydrocortisone cream 1 % Apply topically as needed for itching. 36/14/43   Leighton Ruff, MD  ondansetron (ZOFRAN-ODT) 4 MG disintegrating tablet Take 1 tablet (4 mg total) by mouth every 6 (six) hours as needed for nausea. 15/40/08   Leighton Ruff, MD  oxyCODONE (ROXICODONE) 5 MG/5ML solution Take 5-10 mLs (5-10 mg total) by mouth every 4 (four) hours as needed for moderate pain. 67/61/95   Leighton Ruff, MD   There were no vitals taken for this visit. Physical Exam  Constitutional: She appears well-developed and well-nourished. No distress.  African-American female laying in bed in no acute distress, nontoxic  HENT:  Head: Atraumatic.  Eyes: Conjunctivae are normal.  Neck: Neck supple.  Cardiovascular: Normal rate and regular rhythm.   Pulmonary/Chest: Effort normal and breath sounds normal.  Abdominal: Soft.    Neurological: She is alert.  Skin: No rash noted.  Psychiatric: She has a normal mood and affect.  Nursing note and vitals reviewed.   ED Course  Procedures (including critical care time) Labs Review Labs Reviewed  CBC WITH DIFFERENTIAL/PLATELET - Abnormal; Notable for the following:    RBC 2.73 (*)    Hemoglobin 7.8 (*)    HCT 24.1 (*)    All other components within normal limits  I-STAT CHEM 8, ED - Abnormal; Notable for the following:    BUN 21 (*)    Hemoglobin 7.5 (*)    HCT 22.0 (*)    All other components within normal limits  PROTIME-INR  I-STAT CG4 LACTIC ACID, ED  TYPE AND SCREEN    Imaging Review No results found. I have personally reviewed and evaluated these images and lab results as part of my medical  decision-making.   EKG Interpretation None      MDM   Final diagnoses:  Anemia due to other cause  Encounter for ostomy care education    BP 113/72 mmHg  Pulse 96  Temp(Src) 98.8 F (37.1 C) (Oral)  Resp 16  SpO2 100%   7:06 PM Patient presents with symptoms of leakage around her ostomy site.  Due to the location of her ostomy at the crease of the pannus, it is a challenge to provide good seal.  She does have mild skin erythema at the pannus to suggest skin irritation, and potential cellulitis.  Her abdomen is soft.  She is afebrile, VSS.  Will obtain basic labs and monitor.  8:29 PM Hemoglobin is stable at 7.8. Labs are reassuring. Patient felt reassured. Patient requesting a printout of her labs value. She will continue to receive wound care from home health nurse and she will follow-up with her doctor for further care.  Care discussed with DR. Goldston.  Keflex prescribed for skin irritation on abdomen with early signs of cellulitis.    Domenic Moras, PA-C 11/01/15 2030  Sherwood Gambler, MD 11/01/15 [phone]

## 2015-11-01 NOTE — ED Notes (Signed)
Pt c/o low hemoglobin, fevers, and ileostomy complications x 8 days.  Pt was discharged from [facility] x [facility] days ago.  Hx of multiple recent abdominal surgeries.  Pt's husband sts "her stoma is in an odd spot and we are not able to get a seal around the bag.  Pt has a wound vac above and below stoma.  Pt reports difficulties w/ Hgb while in the hospital.

## 2015-11-01 NOTE — ED Notes (Signed)
PA and RN at bedside for assessment.

## 2015-11-01 NOTE — ED Notes (Signed)
PA at bedside.

## 2015-12-02 ENCOUNTER — Telehealth: Payer: Self-pay | Admitting: General Surgery

## 2015-12-02 NOTE — Telephone Encounter (Signed)
Patient has been drinking clear liquids for a couple weeks without any changes in her fistula output.  I have recommended the patient go to a soft diet and continue this twice year back in the office.  If she has any increased fistula output over the next few days, she will call the office.  We will plan on stopping her TPN over the next few days.

## 2015-12-11 ENCOUNTER — Inpatient Hospital Stay (HOSPITAL_COMMUNITY): Payer: 59

## 2015-12-11 ENCOUNTER — Emergency Department (HOSPITAL_COMMUNITY): Payer: 59

## 2015-12-11 ENCOUNTER — Inpatient Hospital Stay (HOSPITAL_COMMUNITY)
Admission: EM | Admit: 2015-12-11 | Discharge: 2015-12-13 | DRG: 682 | Disposition: A | Discharge status: Home / Self Care | Payer: 59 | Attending: General Surgery | Admitting: General Surgery

## 2015-12-11 ENCOUNTER — Encounter (HOSPITAL_COMMUNITY): Payer: Self-pay | Admitting: Emergency Medicine

## 2015-12-11 DIAGNOSIS — Z833 Family history of diabetes mellitus: Secondary | ICD-10-CM

## 2015-12-11 DIAGNOSIS — F419 Anxiety disorder, unspecified: Secondary | ICD-10-CM | POA: Diagnosis present

## 2015-12-11 DIAGNOSIS — B373 Candidiasis of vulva and vagina: Secondary | ICD-10-CM | POA: Diagnosis present

## 2015-12-11 DIAGNOSIS — R571 Hypovolemic shock: Secondary | ICD-10-CM | POA: Diagnosis present

## 2015-12-11 DIAGNOSIS — Z9049 Acquired absence of other specified parts of digestive tract: Secondary | ICD-10-CM

## 2015-12-11 DIAGNOSIS — N19 Unspecified kidney failure: Secondary | ICD-10-CM | POA: Diagnosis not present

## 2015-12-11 DIAGNOSIS — Z8249 Family history of ischemic heart disease and other diseases of the circulatory system: Secondary | ICD-10-CM

## 2015-12-11 DIAGNOSIS — R578 Other shock: Secondary | ICD-10-CM | POA: Diagnosis present

## 2015-12-11 DIAGNOSIS — Z79899 Other long term (current) drug therapy: Secondary | ICD-10-CM

## 2015-12-11 DIAGNOSIS — N179 Acute kidney failure, unspecified: Secondary | ICD-10-CM | POA: Diagnosis present

## 2015-12-11 DIAGNOSIS — I959 Hypotension, unspecified: Secondary | ICD-10-CM | POA: Diagnosis present

## 2015-12-11 DIAGNOSIS — E785 Hyperlipidemia, unspecified: Secondary | ICD-10-CM | POA: Diagnosis present

## 2015-12-11 DIAGNOSIS — Z86010 Personal history of colonic polyps: Secondary | ICD-10-CM | POA: Diagnosis not present

## 2015-12-11 DIAGNOSIS — N6019 Diffuse cystic mastopathy of unspecified breast: Secondary | ICD-10-CM | POA: Diagnosis present

## 2015-12-11 DIAGNOSIS — K5190 Ulcerative colitis, unspecified, without complications: Secondary | ICD-10-CM | POA: Diagnosis present

## 2015-12-11 DIAGNOSIS — R55 Syncope and collapse: Secondary | ICD-10-CM | POA: Diagnosis present

## 2015-12-11 DIAGNOSIS — Z800 Family history of malignant neoplasm of digestive organs: Secondary | ICD-10-CM

## 2015-12-11 DIAGNOSIS — I10 Essential (primary) hypertension: Secondary | ICD-10-CM | POA: Diagnosis present

## 2015-12-11 DIAGNOSIS — E118 Type 2 diabetes mellitus with unspecified complications: Secondary | ICD-10-CM | POA: Diagnosis present

## 2015-12-11 DIAGNOSIS — Z803 Family history of malignant neoplasm of breast: Secondary | ICD-10-CM

## 2015-12-11 DIAGNOSIS — Z90710 Acquired absence of both cervix and uterus: Secondary | ICD-10-CM

## 2015-12-11 DIAGNOSIS — Z933 Colostomy status: Secondary | ICD-10-CM | POA: Diagnosis not present

## 2015-12-11 DIAGNOSIS — A419 Sepsis, unspecified organism: Secondary | ICD-10-CM

## 2015-12-11 LAB — COMPREHENSIVE METABOLIC PANEL
ALBUMIN: 3 g/dL — AB (ref 3.5–5.0)
ALK PHOS: 70 U/L (ref 38–126)
ALT: 27 U/L (ref 14–54)
ALT: 27 U/L (ref 14–54)
AST: 27 U/L (ref 15–41)
AST: 25 U/L (ref 15–41)
Albumin: 3.2 g/dL — ABNORMAL LOW (ref 3.5–5.0)
Alkaline Phosphatase: 75 U/L (ref 38–126)
Anion gap: 8 (ref 5–15)
Anion gap: 9 (ref 5–15)
BILIRUBIN TOTAL: 0.3 mg/dL (ref 0.3–1.2)
BILIRUBIN TOTAL: 0.1 mg/dL — AB (ref 0.3–1.2)
BUN: 56 mg/dL — AB (ref 6–20)
BUN: 49 mg/dL — AB (ref 6–20)
CALCIUM: 8.3 mg/dL — AB (ref 8.9–10.3)
CO2: 17 mmol/L — ABNORMAL LOW (ref 22–32)
CO2: 14 mmol/L — ABNORMAL LOW (ref 22–32)
CREATININE: 2.95 mg/dL — AB (ref 0.44–1.00)
CREATININE: 2.67 mg/dL — AB (ref 0.44–1.00)
Calcium: 8.2 mg/dL — ABNORMAL LOW (ref 8.9–10.3)
Chloride: 109 mmol/L (ref 101–111)
Chloride: 111 mmol/L (ref 101–111)
GFR calc Af Amer: 21 mL/min — ABNORMAL LOW (ref >60)
GFR, EST AFRICAN AMERICAN: 19 mL/min — AB (ref >60)
GFR, EST NON AFRICAN AMERICAN: 16 mL/min — AB (ref >60)
GFR, EST NON AFRICAN AMERICAN: 18 mL/min — AB (ref >60)
GLUCOSE: 94 mg/dL (ref 65–99)
Glucose, Bld: 106 mg/dL — ABNORMAL HIGH (ref 65–99)
POTASSIUM: 4.7 mmol/L (ref 3.5–5.1)
Potassium: 4.5 mmol/L (ref 3.5–5.1)
Sodium: 134 mmol/L — ABNORMAL LOW (ref 135–145)
Sodium: 134 mmol/L — ABNORMAL LOW (ref 135–145)
TOTAL PROTEIN: 6.7 g/dL (ref 6.5–8.1)
Total Protein: 6.4 g/dL — ABNORMAL LOW (ref 6.5–8.1)

## 2015-12-11 LAB — PHOSPHORUS: Phosphorus: 4.1 mg/dL (ref 2.5–4.6)

## 2015-12-11 LAB — CBC WITH DIFFERENTIAL/PLATELET
BASOS ABS: 0 10*3/uL (ref 0.0–0.1)
Basophils Relative: 0 %
EOS PCT: 2 %
Eosinophils Absolute: 0.2 10*3/uL (ref 0.0–0.7)
HEMATOCRIT: 29.8 % — AB (ref 36.0–46.0)
HEMOGLOBIN: 9.4 g/dL — AB (ref 12.0–15.0)
LYMPHS ABS: 2.9 10*3/uL (ref 0.7–4.0)
LYMPHS PCT: 26 %
MCH: 27.8 pg (ref 26.0–34.0)
MCHC: 31.5 g/dL (ref 30.0–36.0)
MCV: 88.2 fL (ref 78.0–100.0)
MONOS PCT: 4 %
Monocytes Absolute: 0.4 10*3/uL (ref 0.1–1.0)
Neutro Abs: 7.6 10*3/uL (ref 1.7–7.7)
Neutrophils Relative %: 68 %
Platelets: 376 10*3/uL (ref 150–400)
RBC: 3.38 MIL/uL — AB (ref 3.87–5.11)
RDW: 15.6 % — AB (ref 11.5–15.5)
WBC: 11.1 10*3/uL — AB (ref 4.0–10.5)

## 2015-12-11 LAB — URINALYSIS, ROUTINE W REFLEX MICROSCOPIC
BILIRUBIN URINE: NEGATIVE
GLUCOSE, UA: NEGATIVE mg/dL
Ketones, ur: NEGATIVE mg/dL
Nitrite: NEGATIVE
PROTEIN: NEGATIVE mg/dL
SPECIFIC GRAVITY, URINE: 1.014 (ref 1.005–1.030)
pH: 5 (ref 5.0–8.0)

## 2015-12-11 LAB — CBC
HCT: 29.3 % — ABNORMAL LOW (ref 36.0–46.0)
Hemoglobin: 9.4 g/dL — ABNORMAL LOW (ref 12.0–15.0)
MCH: 27.8 pg (ref 26.0–34.0)
MCHC: 32.1 g/dL (ref 30.0–36.0)
MCV: 86.7 fL (ref 78.0–100.0)
PLATELETS: 390 10*3/uL (ref 150–400)
RBC: 3.38 MIL/uL — AB (ref 3.87–5.11)
RDW: 15.6 % — ABNORMAL HIGH (ref 11.5–15.5)
WBC: 11.8 10*3/uL — ABNORMAL HIGH (ref 4.0–10.5)

## 2015-12-11 LAB — PROTIME-INR
INR: 1.16 (ref 0.00–1.49)
Prothrombin Time: 14.9 seconds (ref 11.6–15.2)

## 2015-12-11 LAB — URINE MICROSCOPIC-ADD ON

## 2015-12-11 LAB — CBG MONITORING, ED
GLUCOSE-CAPILLARY: 81 mg/dL (ref 65–99)
Glucose-Capillary: 105 mg/dL — ABNORMAL HIGH (ref 65–99)

## 2015-12-11 LAB — GLUCOSE, CAPILLARY
Glucose-Capillary: 65 mg/dL (ref 65–99)
Glucose-Capillary: 82 mg/dL (ref 65–99)
Glucose-Capillary: 85 mg/dL (ref 65–99)

## 2015-12-11 LAB — I-STAT CG4 LACTIC ACID, ED
LACTIC ACID, VENOUS: 0.67 mmol/L (ref 0.5–2.0)
Lactic Acid, Venous: 1.05 mmol/L (ref 0.5–2.0)

## 2015-12-11 LAB — MRSA PCR SCREENING: MRSA by PCR: NEGATIVE

## 2015-12-11 LAB — PROCALCITONIN

## 2015-12-11 LAB — CORTISOL: CORTISOL PLASMA: 4.2 ug/dL

## 2015-12-11 LAB — LACTIC ACID, PLASMA: Lactic Acid, Venous: 0.9 mmol/L (ref 0.5–2.0)

## 2015-12-11 LAB — MAGNESIUM: MAGNESIUM: 1.8 mg/dL (ref 1.7–2.4)

## 2015-12-11 LAB — APTT: aPTT: 23 seconds — ABNORMAL LOW (ref 24–37)

## 2015-12-11 LAB — TROPONIN I

## 2015-12-11 MED ORDER — SODIUM CHLORIDE 0.9 % IV BOLUS (SEPSIS)
1000.0000 mL | Freq: Once | INTRAVENOUS | Status: AC
  Administered 2015-12-11: 1000 mL via INTRAVENOUS

## 2015-12-11 MED ORDER — ACETAMINOPHEN 325 MG PO TABS
650.0000 mg | ORAL_TABLET | Freq: Four times a day (QID) | ORAL | Status: DC | PRN
  Filled 2015-12-11: qty 2

## 2015-12-11 MED ORDER — SODIUM CHLORIDE 0.9% FLUSH
3.0000 mL | Freq: Two times a day (BID) | INTRAVENOUS | Status: DC
  Administered 2015-12-11 – 2015-12-12 (×3): 3 mL via INTRAVENOUS

## 2015-12-11 MED ORDER — CEFTRIAXONE SODIUM 1 G IJ SOLR
1.0000 g | INTRAMUSCULAR | Status: DC
Start: 2015-12-11 — End: 2015-12-11

## 2015-12-11 MED ORDER — LEVALBUTEROL HCL 0.63 MG/3ML IN NEBU: 0.6300 mg | INHALATION_SOLUTION | Freq: Four times a day (QID) | RESPIRATORY_TRACT | Status: DC | PRN

## 2015-12-11 MED ORDER — SODIUM CHLORIDE 0.9 % IV SOLN
INTRAVENOUS | Status: AC
  Administered 2015-12-11: 10:00:00 via INTRAVENOUS

## 2015-12-11 MED ORDER — ACETAMINOPHEN 650 MG RE SUPP: 650.0000 mg | Freq: Four times a day (QID) | RECTAL | Status: DC | PRN

## 2015-12-11 MED ORDER — DIAZEPAM 5 MG PO TABS: 5.0000 mg | ORAL_TABLET | Freq: Once | ORAL | Status: DC

## 2015-12-11 MED ORDER — SODIUM CHLORIDE 0.9 % IV BOLUS (SEPSIS)
500.0000 mL | Freq: Once | INTRAVENOUS | Status: AC
  Administered 2015-12-11: 500 mL via INTRAVENOUS

## 2015-12-11 MED ORDER — PIPERACILLIN-TAZOBACTAM 3.375 G IVPB 30 MIN
3.3750 g | Freq: Once | INTRAVENOUS | Status: AC
  Administered 2015-12-11: 3.375 g via INTRAVENOUS
  Filled 2015-12-11: qty 50

## 2015-12-11 MED ORDER — PHENAZOPYRIDINE HCL 200 MG PO TABS: 200.0000 mg | ORAL_TABLET | Freq: Once | ORAL | Status: DC

## 2015-12-11 MED ORDER — INSULIN ASPART 100 UNIT/ML ~~LOC~~ SOLN: 0.0000 [IU] | Freq: Three times a day (TID) | SUBCUTANEOUS | Status: DC

## 2015-12-11 MED ORDER — CALCIUM POLYCARBOPHIL 625 MG PO TABS
625.0000 mg | ORAL_TABLET | Freq: Every day | ORAL | Status: DC
  Administered 2015-12-11 – 2015-12-13 (×3): 625 mg via ORAL
  Filled 2015-12-11 (×3): qty 1

## 2015-12-11 MED ORDER — ONDANSETRON HCL 4 MG/2ML IJ SOLN: 4.0000 mg | Freq: Four times a day (QID) | INTRAMUSCULAR | Status: DC | PRN

## 2015-12-11 MED ORDER — LOPERAMIDE HCL 2 MG PO CAPS
2.0000 mg | ORAL_CAPSULE | Freq: Three times a day (TID) | ORAL | Status: DC
  Administered 2015-12-11 – 2015-12-13 (×6): 2 mg via ORAL
  Filled 2015-12-11 (×8): qty 1

## 2015-12-11 MED ORDER — ONDANSETRON HCL 4 MG PO TABS: 4.0000 mg | ORAL_TABLET | Freq: Four times a day (QID) | ORAL | Status: DC | PRN

## 2015-12-11 MED ORDER — PIPERACILLIN-TAZOBACTAM IN DEX 2-0.25 GM/50ML IV SOLN
2.2500 g | Freq: Three times a day (TID) | INTRAVENOUS | Status: DC
  Filled 2015-12-11: qty 50

## 2015-12-11 MED ORDER — ENOXAPARIN SODIUM 30 MG/0.3ML ~~LOC~~ SOLN
30.0000 mg | SUBCUTANEOUS | Status: DC
  Administered 2015-12-11 – 2015-12-12 (×2): 30 mg via SUBCUTANEOUS
  Filled 2015-12-11 (×5): qty 0.3

## 2015-12-11 NOTE — Consult Note (Signed)
[facility] ostomy consult note Stoma type/location: ileostomy at midline.  Fistula is now closed. Dr. Marcello Moores in to see while I am here. Stomal assessment/size: approximately 1 and 1/8 inch slightly oval Peristomal assessment: Not seen today Treatment options for stomal/peristomal skin: powder, calcium alginate, skin barrier ring, pouch Output: thin effluent, brown  Ostomy pouching: 2pc. Flat pouching system with skin barrier rings.  Calcium alginate dressings. Education provided: None today Enrolled patient in [place] program: Yes, previously [facility] nursing team will follow, and will remain available to this patient, the nursing, surgical and medical teams.   Thanks, Maudie Flakes, MSN, RN, [place], Arther Abbott  Pager# [phone]

## 2015-12-11 NOTE — ED Notes (Signed)
Campos, MD aware of patient's blood pressure trend.

## 2015-12-11 NOTE — ED Notes (Signed)
Bed: WA04 Expected date:  Expected time:  Means of arrival:  Comments: EMS  Weakness

## 2015-12-11 NOTE — Progress Notes (Signed)
ANTIBIOTIC CONSULT NOTE - INITIAL  Pharmacy Consult for zosyn Indication: intra-abdominal infection  No Known Allergies  Patient Measurements: Height: 5' 3"  (160 cm) Weight: 179 lb (81.194 kg) IBW/kg (Calculated) : 52.4   Vital Signs: Temp: 97.8 F (36.6 C) (02/01 0404) BP: 87/57 mmHg (02/01 0700) Pulse Rate: 95 (02/01 0700) Intake/Output from previous day:   Intake/Output from this shift:    Labs:  Recent Labs  12/11/15 0523  WBC 11.8*  HGB 9.4*  PLT 390  CREATININE 2.95*   Estimated Creatinine Clearance: 20.5 mL/min (by C-G formula based on Cr of 2.95). No results for input(s): VANCOTROUGH, VANCOPEAK, VANCORANDOM, GENTTROUGH, GENTPEAK, GENTRANDOM, TOBRATROUGH, TOBRAPEAK, TOBRARND, AMIKACINPEAK, AMIKACINTROU, AMIKACIN in the last 72 hours.   Microbiology: No results found for this or any previous visit (from the past 720 hour(s)).  Medical History: Past Medical History  Diagnosis Date  . Hypertension   . Colitis, ulcerative ([place])   . Hyperlipidemia   . Colon polyps   . Blood transfusion   . Anemia   . Shortness of breath dyspnea     talking or walking   . Type 2 diabetes mellitus with complication, without long-term current use of insulin ([place]) 09/05/2015    currently on no medications   . Numbness and tingling     hands and feet bilat    Assessment: 60y.o. Female with history of ulcerative colitis.  She underwent redo ostomy in December 2016. She was most recently on TPN that was stopped ~1 week ago, PICC removed 5 days ago.  Pt brought to ED after a syncopal episode and lightheadedness for ~1-2 days. Pt is dehydrated and has AKI.  Scr 2.95, baseline Scr ~ 0.8  Goal of Therapy:  Zosyn per renal function  Plan:  Zosyn 3.330m IV x1 run over 30 mintues then Zosyn 2.215mIV q8h Adjust dose as renal function improves  ElDolly RiasPh 12/11/2015, 7:43 AM Pager [phone]

## 2015-12-11 NOTE — ED Notes (Signed)
Pt is unable to give a urine sample. Pt is aware a sample is needed.

## 2015-12-11 NOTE — Progress Notes (Signed)
[facility]  Patient Status: Active (receiving services up to time of hospitalization)  [facility] is providing the following services: RN and HHA  If patient discharges after hours, please call [phone].   Latasha Wood 12/11/2015, 11:11 AM

## 2015-12-11 NOTE — ED Notes (Signed)
Per EMS patient called out due to "passing out in the bed" 72/50 upon EMS arrival. Positive orthostatic changes EMS gave 500cc bolus of NS. Blood pressure did not improve.

## 2015-12-11 NOTE — ED Notes (Signed)
MD at bedside. 

## 2015-12-11 NOTE — H&P (Signed)
Latasha Wood is an 60 y.o. female.   Chief Complaint: near syncope HPI: 60 y.o. F s/p complicated abd surgery requiring midline ostomy in early Dec.  Complicated by EC fistula, requiring TPN.  Pt has just came off TPN over the last week.  They think ileostomy outputs have been about a 1L to 1533m but they haven't been closely recording this.  UOP has been low.    Past Medical History  Diagnosis Date  . Hypertension   . Colitis, ulcerative ([place]   . Hyperlipidemia   . Colon polyps   . Blood transfusion   . Anemia   . Shortness of breath dyspnea     talking or walking   . Type 2 diabetes mellitus with complication, without long-term current use of insulin ([place] 09/05/2015    currently on no medications   . Numbness and tingling     hands and feet bilat     Past Surgical History  Procedure Laterality Date  . Gravida 6 para 2      all SAB  . Hyadiform mole    . Dilation and curettage of uterus      most likely after miscarriage  . Knee arthroscopy w/ meniscal repair      right knee ([place])  . Fibrocystic breast disease      q 6 month mammogram  . Abdominal hysterectomy  2000  . Small bowel obstruction    . Robotic assisted laparoscopic lysis of adhesion N/A 09/25/2015    Procedure: XI ROBOTIC ASSISTED LAPAROSCOPIC LYSIS OF ADHESION;  Surgeon: ALeighton Ruff MD;  Location: [facility];  Service: General;  Laterality: N/A;  90 minutes  . Laparoscopic small bowel resection N/A 09/25/2015    Procedure: LAPAROSCOPIC BOWEL RESECTION TIMES TWO;  Surgeon: ALeighton Ruff MD;  [facility];  Service: General;  Laterality: N/A;  . Ileo loop diversion N/A 09/25/2015    Procedure: ILEO LOOP COLOSTOMY;  Surgeon: ALeighton Ruff MD;  [facility];  Service: General;  Laterality: N/A;  . Ileostomy closure N/A 10/08/2015    Procedure: ILEOSTOMY REVISION;  Surgeon: ALeighton Ruff MD;  Location[facility];  Service: General;  Laterality: N/A;  . Abdominal surgery    . Colon surgery     colostomy    Family History  Problem Relation Age of Onset  . Diabetes Mother   . Breast cancer Mother 539 . Diabetes Father   . Heart disease Father   . Diabetes Sister   . Colon cancer Paternal Aunt   . Diabetes Brother   . Breast cancer Sister 363  Social History:  reports that she has never smoked. She has never used smokeless tobacco. She reports that she drinks about 1.0 oz of alcohol per week. She reports that she does not use illicit drugs.  Allergies: No Known Allergies   (Not in a hospital admission)  Results for orders placed or performed during the hospital encounter of 12/11/15 (from the past 48 hour(s))  CBG monitoring, ED     Status: Abnormal   Collection Time: 12/11/15  4:08 AM  Result Value Ref Range   Glucose-Capillary 105 (H) 65 - 99 mg/dL  CBC     Status: Abnormal   Collection Time: 12/11/15  5:23 AM  Result Value Ref Range   WBC 11.8 (H) 4.0 - 10.5 K/uL   RBC 3.38 (L) 3.87 - 5.11 MIL/uL   Hemoglobin 9.4 (L) 12.0 - 15.0 g/dL   HCT 29.3 (L) 36.0 - 46.0 %  MCV 86.7 78.0 - 100.0 fL   MCH 27.8 26.0 - 34.0 pg   MCHC 32.1 30.0 - 36.0 g/dL   RDW 15.6 (H) 11.5 - 15.5 %   Platelets 390 150 - 400 K/uL  Comprehensive metabolic panel     Status: Abnormal   Collection Time: 12/11/15  5:23 AM  Result Value Ref Range   Sodium 134 (L) 135 - 145 mmol/L   Potassium 4.5 3.5 - 5.1 mmol/L   Chloride 109 101 - 111 mmol/L   CO2 17 (L) 22 - 32 mmol/L   Glucose, Bld 106 (H) 65 - 99 mg/dL   BUN 56 (H) 6 - 20 mg/dL   Creatinine, Ser 2.95 (H) 0.44 - 1.00 mg/dL   Calcium 8.2 (L) 8.9 - 10.3 mg/dL   Total Protein 6.7 6.5 - 8.1 g/dL   Albumin 3.2 (L) 3.5 - 5.0 g/dL   AST 27 15 - 41 U/L   ALT 27 14 - 54 U/L   Alkaline Phosphatase 75 38 - 126 U/L   Total Bilirubin 0.3 0.3 - 1.2 mg/dL   GFR calc non Af Amer 16 (L) >60 mL/min   GFR calc Af Amer 19 (L) >60 mL/min    Comment: (NOTE) The eGFR has been calculated using the CKD EPI equation. This calculation has not been  validated in all clinical situations. eGFR's persistently <60 mL/min signify possible Chronic Kidney Disease.    Anion gap 8 5 - 15  Troponin I     Status: None   Collection Time: 12/11/15  5:23 AM  Result Value Ref Range   Troponin I <0.03 <0.031 ng/mL    Comment:        NO INDICATION OF MYOCARDIAL INJURY.   I-Stat CG4 Lactic Acid, ED     Status: None   Collection Time: 12/11/15  5:32 AM  Result Value Ref Range   Lactic Acid, Venous 0.67 0.5 - 2.0 mmol/L  Urinalysis, Routine w reflex microscopic (not at [facility])     Status: Abnormal   Collection Time: 12/11/15  5:40 AM  Result Value Ref Range   Color, Urine YELLOW YELLOW   APPearance CLOUDY (A) CLEAR   Specific Gravity, Urine 1.014 1.005 - 1.030   pH 5.0 5.0 - 8.0   Glucose, UA NEGATIVE NEGATIVE mg/dL   Hgb urine dipstick TRACE (A) NEGATIVE   Bilirubin Urine NEGATIVE NEGATIVE   Ketones, ur NEGATIVE NEGATIVE mg/dL   Protein, ur NEGATIVE NEGATIVE mg/dL   Nitrite NEGATIVE NEGATIVE   Leukocytes, UA SMALL (A) NEGATIVE  Urine microscopic-add on     Status: Abnormal   Collection Time: 12/11/15  5:40 AM  Result Value Ref Range   Squamous Epithelial / LPF 6-30 (A) NONE SEEN   WBC, UA 0-5 0 - 5 WBC/hpf   RBC / HPF 0-5 0 - 5 RBC/hpf   Bacteria, UA FEW (A) NONE SEEN   Casts HYALINE CASTS (A) NEGATIVE  I-Stat CG4 Lactic Acid, ED     Status: None   Collection Time: 12/11/15  8:37 AM  Result Value Ref Range   Lactic Acid, Venous 1.05 0.5 - 2.0 mmol/L  CBC with Differential     Status: Abnormal (Preliminary result)   Collection Time: 12/11/15  8:38 AM  Result Value Ref Range   WBC 11.1 (H) 4.0 - 10.5 K/uL   RBC 3.38 (L) 3.87 - 5.11 MIL/uL   Hemoglobin 9.4 (L) 12.0 - 15.0 g/dL   HCT 29.8 (L) 36.0 - 46.0 %  MCV 88.2 78.0 - 100.0 fL   MCH 27.8 26.0 - 34.0 pg   MCHC 31.5 30.0 - 36.0 g/dL   RDW 15.6 (H) 11.5 - 15.5 %   Platelets 376 150 - 400 K/uL   Neutrophils Relative % PENDING %   Neutro Abs PENDING 1.7 - 7.7 K/uL   Band  Neutrophils PENDING %   Lymphocytes Relative PENDING %   Lymphs Abs PENDING 0.7 - 4.0 K/uL   Monocytes Relative PENDING %   Monocytes Absolute PENDING 0.1 - 1.0 K/uL   Eosinophils Relative PENDING %   Eosinophils Absolute PENDING 0.0 - 0.7 K/uL   Basophils Relative PENDING %   Basophils Absolute PENDING 0.0 - 0.1 K/uL   WBC Morphology PENDING    RBC Morphology PENDING    Smear Review PENDING    nRBC PENDING 0 /100 WBC   Metamyelocytes Relative PENDING %   Myelocytes PENDING %   Promyelocytes Absolute PENDING %   Blasts PENDING %  Comprehensive metabolic panel     Status: Abnormal   Collection Time: 12/11/15  8:38 AM  Result Value Ref Range   Sodium 134 (L) 135 - 145 mmol/L   Potassium 4.7 3.5 - 5.1 mmol/L   Chloride 111 101 - 111 mmol/L   CO2 14 (L) 22 - 32 mmol/L   Glucose, Bld 94 65 - 99 mg/dL   BUN 49 (H) 6 - 20 mg/dL   Creatinine, Ser 2.67 (H) 0.44 - 1.00 mg/dL   Calcium 8.3 (L) 8.9 - 10.3 mg/dL   Total Protein 6.4 (L) 6.5 - 8.1 g/dL   Albumin 3.0 (L) 3.5 - 5.0 g/dL   AST 25 15 - 41 U/L   ALT 27 14 - 54 U/L   Alkaline Phosphatase 70 38 - 126 U/L   Total Bilirubin 0.1 (L) 0.3 - 1.2 mg/dL   GFR calc non Af Amer 18 (L) >60 mL/min   GFR calc Af Amer 21 (L) >60 mL/min    Comment: (NOTE) The eGFR has been calculated using the CKD EPI equation. This calculation has not been validated in all clinical situations. eGFR's persistently <60 mL/min signify possible Chronic Kidney Disease.    Anion gap 9 5 - 15  Lactic acid, plasma     Status: None   Collection Time: 12/11/15  8:38 AM  Result Value Ref Range   Lactic Acid, Venous 0.9 0.5 - 2.0 mmol/L  Protime-INR     Status: None   Collection Time: 12/11/15  8:38 AM  Result Value Ref Range   Prothrombin Time 14.9 11.6 - 15.2 seconds   INR 1.16 0.00 - 1.49  APTT     Status: Abnormal   Collection Time: 12/11/15  8:38 AM  Result Value Ref Range   aPTT 23 (L) 24 - 37 seconds  Troponin I     Status: None   Collection Time:  12/11/15  8:38 AM  Result Value Ref Range   Troponin I <0.03 <0.031 ng/mL    Comment:        NO INDICATION OF MYOCARDIAL INJURY.   Magnesium     Status: None   Collection Time: 12/11/15  8:38 AM  Result Value Ref Range   Magnesium 1.8 1.7 - 2.4 mg/dL  Phosphorus     Status: None   Collection Time: 12/11/15  8:38 AM  Result Value Ref Range   Phosphorus 4.1 2.5 - 4.6 mg/dL   Dg Chest 2 View  12/11/2015  CLINICAL DATA:  Shortness of  breath and hypotension EXAM: CHEST  2 VIEW COMPARISON:  09/01/2015 FINDINGS: Minimal linear atelectasis. There is no edema, consolidation, effusion, or pneumothorax. Normal heart size and aortic contours. IMPRESSION: No edema or pneumonia. Electronically Signed   By: Monte Fantasia M.D.   On: 12/11/2015 05:09   Dg Abd 1 View  12/11/2015  CLINICAL DATA:  Mid abdominal discomfort, question sepsis, syncope, history ulcerative colitis, hypertension, and type II diabetes mellitus EXAM: ABDOMEN - 1 VIEW COMPARISON:  09/02/2015 FINDINGS: Nonspecific bowel gas pattern. Single air-filled upper normal caliber small bowel loop LEFT upper quadrant, nonspecific. Remaining bowel loops unremarkable. No bowel dilatation or bowel wall thickening. Osseous structures unremarkable. No urinary tract calcification. IMPRESSION: Nonspecific bowel gas pattern. Electronically Signed   By: Lavonia Dana M.D.   On: 12/11/2015 08:15   US Renal  12/11/2015  CLINICAL DATA:  Renal failure. EXAM: RENAL / URINARY TRACT ULTRASOUND COMPLETE COMPARISON:  None. FINDINGS: Right Kidney: Length: 11.2 cm. Echogenicity within normal limits. No mass or hydronephrosis visualized. There is a 1.2 cm simple appearing cyst within the midpole of the right kidney. Left Kidney: Length: 11.5 cm. Echogenicity within normal limits. No mass or hydronephrosis visualized. Bladder: Appears normal for degree of bladder distention. IMPRESSION: 1.2 cm simple appearing cyst in the midpole of the right kidney, otherwise normal renal  ultrasound. Electronically Signed   By: Fidela Salisbury M.D.   On: 12/11/2015 07:50    Review of Systems  Constitutional: Positive for malaise/fatigue. Negative for fever and chills.  Eyes: Negative for blurred vision.  Respiratory: Negative for cough and shortness of breath.   Cardiovascular: Negative for chest pain.  Gastrointestinal: Positive for abdominal pain. Negative for nausea and vomiting.  Genitourinary: Negative for dysuria, urgency, frequency, hematuria and flank pain.  Musculoskeletal: Negative for myalgias, back pain and joint pain.  Skin: Negative for rash.  Neurological: Positive for dizziness, weakness and headaches.    Blood pressure 102/57, pulse 89, temperature 97.8 F (36.6 C), resp. rate 16, height _0  (1.6 m), weight 81.194 kg (179 lb), SpO2 100 %. Physical Exam  Constitutional: She is oriented to person, place, and time. She appears well-developed and well-nourished. No distress.  HENT:  Head: Normocephalic and atraumatic.  Eyes: Conjunctivae are normal. Pupils are equal, round, and reactive to light. Left eye exhibits no discharge.  Neck: Normal range of motion.  Cardiovascular: Normal rate and regular rhythm.   Respiratory: Effort normal and breath sounds normal. No respiratory distress.  GI: Soft. She exhibits no distension. There is no tenderness. There is no rebound and no guarding.  Ostomy with good stool output  Musculoskeletal: Normal range of motion.  Neurological: She is alert and oriented to person, place, and time.  Skin: Skin is warm and dry.     Assessment/Plan Dehydration most likely due to poor oral intake and increased ostomy intake.  Will admit for rehydration and close monitoring.  Hold BP meds. Will add imodium and fiber to regimen to slow ostomy output.   Pt having some pouching difficulties and trouble with leaking- will have ostomy RN evaluate while in house. UA with trace leukocytosis.  Will follow up urine culture but will  hold on antibiotics for now given absence of symptoms. Recheck labs in AM  [place], Elmo Putt C., MD 4/0/1027, 10:20 AM

## 2015-12-11 NOTE — Consult Note (Addendum)
[facility] History and Physical  FREDDIE NGHIEM MWN:[id number] DOB: Jan 11, 1956 DOA: 12/11/2015  Referring physician: PCP: Scarlette Calico, MD   Chief Complaint: Loss of consciousness  HPI:  60 year old female with a history of ulcerative colitis, hypertension, type 2 diabetes, brought in by EMS today for lightheadedness for the last 1-2 days of a syncopal episode. Patient has had some high output to 1500 mL from her ostomy bag. She has also recently been taken off TPN, and PICC line was removed 5 days ago. Patient feels that she has not been eating and drinking enough, recently transitioned to more solid food in the last couple of days. She continues to have intermittent nausea, she has not noted any diarrhea but has noticed increased output from her ileostomy. She denies any fever chills or riders at home. She does have vaginal yeast  infection for which she is requesting treatment, and has noted that her urine is somewhat darker than usual. Workup in the ER revealed a creatinine of 2.95 up from 0.8, troponin negative, lactic acid negative, white count 11.8, hemoglobin 9.4. Blood pressure parameters improved with 3-1/2 L of fluid. UA 6-30 WBCs with small amount of leukocytes, doubt UTI. KUB negative, chest x-ray negative     Review of Systems: negative for the following  Constitutional: Denies fever, chills, diaphoresis, appetite change and fatigue.  HEENT: Denies photophobia, eye pain, redness, hearing loss, ear pain, congestion, sore throat, rhinorrhea, sneezing, mouth sores, trouble swallowing, neck pain, neck stiffness and tinnitus.  Respiratory: Denies SOB, DOE, cough, chest tightness, and wheezing.  Cardiovascular: Denies chest pain, palpitations and leg swelling.  Gastrointestinal: Denies nausea, vomiting, abdominal pain, diarrhea, constipation, blood in stool and abdominal distention.  Genitourinary: Denies dysuria, urgency, frequency, hematuria, flank pain and difficulty  urinating.  Musculoskeletal: Denies myalgias, back pain, joint swelling, arthralgias and gait problem.  Skin: Denies pallor, rash and wound.  Neurological: Positive for dizziness, seizures, syncope, weakness, light-headedness, numbness and headaches.  Hematological: Denies adenopathy. Easy bruising, personal or family bleeding history  Psychiatric/Behavioral: Denies suicidal ideation, mood changes, confusion, nervousness, sleep disturbance and agitation       Past Medical History  Diagnosis Date  . Hypertension   . Colitis, ulcerative ([place])   . Hyperlipidemia   . Colon polyps   . Blood transfusion   . Anemia   . Shortness of breath dyspnea     talking or walking   . Type 2 diabetes mellitus with complication, without long-term current use of insulin ([place]) 09/05/2015    currently on no medications   . Numbness and tingling     hands and feet bilat      Past Surgical History  Procedure Laterality Date  . Gravida 6 para 2      all SAB  . Hyadiform mole    . Dilation and curettage of uterus      most likely after miscarriage  . Knee arthroscopy w/ meniscal repair      right knee ([place])  . Fibrocystic breast disease      q 6 month mammogram  . Abdominal hysterectomy  2000  . Small bowel obstruction    . Robotic assisted laparoscopic lysis of adhesion N/A 09/25/2015    Procedure: XI ROBOTIC ASSISTED LAPAROSCOPIC LYSIS OF ADHESION;  Surgeon: Leighton Ruff, MD;  Location: [facility];  Service: General;  Laterality: N/A;  90 minutes  . Laparoscopic small bowel resection N/A 09/25/2015    Procedure: LAPAROSCOPIC BOWEL RESECTION TIMES TWO;  Surgeon: Leighton Ruff, MD;  Location: [facility];  Service: General;  Laterality: N/A;  . Ileo loop diversion N/A 09/25/2015    Procedure: ILEO LOOP COLOSTOMY;  Surgeon: Leighton Ruff, MD;  Location: [facility];  Service: General;  Laterality: N/A;  . Ileostomy closure N/A 10/08/2015    Procedure: ILEOSTOMY REVISION;  Surgeon: Leighton Ruff, MD;   Location: [facility];  Service: General;  Laterality: N/A;  . Abdominal surgery    . Colon surgery      colostomy      Social History:  reports that she has never smoked. She has never used smokeless tobacco. She reports that she drinks about 1.0 oz of alcohol per week. She reports that she does not use illicit drugs.  No Known Allergies  Family History  Problem Relation Age of Onset  . Diabetes Mother   . Breast cancer Mother 46  . Diabetes Father   . Heart disease Father   . Diabetes Sister   . Colon cancer Paternal Aunt   . Diabetes Brother   . Breast cancer Sister 47         Prior to Admission medications   Medication Sig Start Date End Date Taking? Authorizing Provider  acetaminophen (TYLENOL) 650 MG CR tablet Take 650 mg by mouth every 8 (eight) hours as needed for pain.   Yes Historical Provider, MD  ALPRAZolam (XANAX) 0.25 MG tablet Take 1 tablet (0.25 mg total) by mouth 2 (two) times daily as needed for anxiety. 09/05/15  Yes Janith Lima, MD  BENICAR HCT 40-12.5 MG tablet Take 1 tablet by mouth daily. 09/04/15  Yes Historical Provider, MD  hydrocortisone cream 1 % Apply topically as needed for itching. Patient taking differently: Apply 1 application topically 2 (two) times daily as needed for itching.  70/96/28  Yes Leighton Ruff, MD  ondansetron (ZOFRAN-ODT) 4 MG disintegrating tablet Take 1 tablet (4 mg total) by mouth every 6 (six) hours as needed for nausea. 36/62/94  Yes Leighton Ruff, MD  oxyCODONE (ROXICODONE) 5 MG/5ML solution Take 5-10 mLs (5-10 mg total) by mouth every 4 (four) hours as needed for moderate pain. 76/54/65  Yes Leighton Ruff, MD  cephALEXin (KEFLEX) 500 MG capsule Take 2 capsules (1,000 mg total) by mouth 2 (two) times daily. Patient not taking: Reported on 12/11/2015 11/01/15   Domenic Moras, PA-C  famotidine (PEPCID) 20 MG tablet Take 1 tablet (20 mg total) by mouth 2 (two) times daily. Patient not taking: Reported on 0/01/5464 68/12/75   Leighton Ruff, MD     Physical Exam: Filed Vitals:   12/11/15 0900 12/11/15 0930 12/11/15 1000 12/11/15 1030  BP: 86/54 87/74 83/66  99/66  Pulse: 83 97 88 94  Temp:      TempSrc:      Resp: 12 20 13 15   Height:      Weight:      SpO2: 100% 100% 100% 100%     Constitutional: Vital signs reviewed. Patient is a well-developed and well-nourished in no acute distress and cooperative with exam. Alert and oriented x3.  Head: Normocephalic and atraumatic  Ear: TM normal bilaterally  Mouth: no erythema or exudates, MMM  Eyes: PERRL, EOMI, conjunctivae normal, No scleral icterus.  Neck: Supple, Trachea midline normal ROM, No JVD, mass, thyromegaly, or carotid bruit present.  Cardiovascular: RRR, S1 normal, S2 normal, no MRG, pulses symmetric and intact bilaterally  Pulmonary/Chest: CTAB, no wheezes, rales, or rhonchi  Abdominal: Soft. Non-tender, non-distended, bowel sounds are normal, no masses, organomegaly, or guarding present.  GU: no CVA tenderness Musculoskeletal: No joint deformities, erythema, or stiffness, ROM full and no nontender Ext: no edema and no cyanosis, pulses palpable bilaterally (DP and PT)  Hematology: no cervical, inginal, or axillary adenopathy.  Neurological: A&O x3, Strenght is normal and symmetric bilaterally, cranial nerve II-XII are grossly intact, no focal motor deficit, sensory intact to light touch bilaterally.  Skin: Warm, dry and intact. No rash, cyanosis, or clubbing.  Psychiatric: Normal mood and affect. speech and behavior is normal. Judgment and thought content normal. Cognition and memory are normal.      Data Review   Micro Results No results found for this or any previous visit (from the past 240 hour(s)).  Radiology Reports Dg Chest 2 View  12/11/2015  CLINICAL DATA:  Shortness of breath and hypotension EXAM: CHEST  2 VIEW COMPARISON:  09/01/2015 FINDINGS: Minimal linear atelectasis. There is no edema, consolidation, effusion, or pneumothorax. Normal  heart size and aortic contours. IMPRESSION: No edema or pneumonia. Electronically Signed   By: Monte Fantasia M.D.   On: 12/11/2015 05:09   Dg Abd 1 View  12/11/2015  CLINICAL DATA:  Mid abdominal discomfort, question sepsis, syncope, history ulcerative colitis, hypertension, and type II diabetes mellitus EXAM: ABDOMEN - 1 VIEW COMPARISON:  09/02/2015 FINDINGS: Nonspecific bowel gas pattern. Single air-filled upper normal caliber small bowel loop LEFT upper quadrant, nonspecific. Remaining bowel loops unremarkable. No bowel dilatation or bowel wall thickening. Osseous structures unremarkable. No urinary tract calcification. IMPRESSION: Nonspecific bowel gas pattern. Electronically Signed   By: Lavonia Dana M.D.   On: 12/11/2015 08:15   US Renal  12/11/2015  CLINICAL DATA:  Renal failure. EXAM: RENAL / URINARY TRACT ULTRASOUND COMPLETE COMPARISON:  None. FINDINGS: Right Kidney: Length: 11.2 cm. Echogenicity within normal limits. No mass or hydronephrosis visualized. There is a 1.2 cm simple appearing cyst within the midpole of the right kidney. Left Kidney: Length: 11.5 cm. Echogenicity within normal limits. No mass or hydronephrosis visualized. Bladder: Appears normal for degree of bladder distention. IMPRESSION: 1.2 cm simple appearing cyst in the midpole of the right kidney, otherwise normal renal ultrasound. Electronically Signed   By: Fidela Salisbury M.D.   On: 12/11/2015 07:50     CBC  Recent Labs Lab 12/11/15 0523 12/11/15 0838  WBC 11.8* 11.1*  HGB 9.4* 9.4*  HCT 29.3* 29.8*  PLT 390 376  MCV 86.7 88.2  MCH 27.8 27.8  MCHC 32.1 31.5  RDW 15.6* 15.6*  LYMPHSABS  --  2.9  MONOABS  --  0.4  EOSABS  --  0.2  BASOSABS  --  0.0    Chemistries   Recent Labs Lab 12/11/15 0523 12/11/15 0838  NA 134* 134*  K 4.5 4.7  CL 109 111  CO2 17* 14*  GLUCOSE 106* 94  BUN 56* 49*  CREATININE 2.95* 2.67*  CALCIUM 8.2* 8.3*  MG  --  1.8  AST 27 25  ALT 27 27  ALKPHOS 75 70  BILITOT  0.3 0.1*   ------------------------------------------------------------------------------------------------------------------ estimated creatinine clearance is 22.6 mL/min (by C-G formula based on Cr of 2.67). ------------------------------------------------------------------------------------------------------------------ No results for input(s): HGBA1C in the last 72 hours. ------------------------------------------------------------------------------------------------------------------ No results for input(s): CHOL, HDL, LDLCALC, TRIG, CHOLHDL, LDLDIRECT in the last 72 hours. ------------------------------------------------------------------------------------------------------------------ No results for input(s): TSH, T4TOTAL, T3FREE, THYROIDAB in the last 72 hours.  Invalid input(s): FREET3 ------------------------------------------------------------------------------------------------------------------ No results for input(s): VITAMINB12, FOLATE, FERRITIN, TIBC, IRON, RETICCTPCT in the last 72 hours.  Coagulation profile  Recent Labs Lab 12/11/15  0838  INR 1.16    No results for input(s): DDIMER in the last 72 hours.  Cardiac Enzymes  Recent Labs Lab 12/11/15 0523 12/11/15 0838  TROPONINI <0.03 <0.03   ------------------------------------------------------------------------------------------------------------------ Invalid input(s): POCBNP   CBG:  Recent Labs Lab 12/11/15 0408  GLUCAP 105*       EKG: Independently reviewed.    Assessment/Plan Active Problems:   Renal failure-likely prerenal in the setting of dehydration, in the setting of increased ileostomy output, not enough by mouth intake Already improving with IV hydration    Hypovolemic shock (HCC)-aggressively recent dated with IV fluids, doubt sepsis, doubt underlying infection   Type 2 diabetes, start patient on omeprazole and check hemoglobin A1c, last hemoglobin A1c was 6.6, currently on no  treatment at home  Ulcerative colitis, symptoms appear to be stable at this point  Hold antihypertensive medications  Anxiety continue with Xanax  Recent enterococcal UTI, UA negative upon this admission  [facility] will sign off, please call us back if needed     Code Status Orders        Start     Ordered   12/11/15 0729  Full code   Continuous     12/11/15 0729       Family Communication: bedside Disposition Plan: admit   Total time spent 55 minutes.Greater than 50% of this time was spent in counseling, explanation of diagnosis, planning of further management, and coordination of care  [facility] Pager [phone]  If 7PM-7AM, please contact night-coverage [email or web address] Password [email or web address] 12/11/2015, 10:41 AM

## 2015-12-11 NOTE — ED Notes (Signed)
Spoke to [place] and she said once a patient gets discharged, she will call and I can bring the patient up to 2nd floor.

## 2015-12-11 NOTE — Progress Notes (Addendum)
Pt has been active with [facility] RN Since last d/c on 10/24/15  DME Arranged: Vac, IV pump/equipment DME Agency: [place] [employer]  HH Arranged: RN, Disease Management [place] Agency: [place]  ED CM contacted [facility] staff to notify of admission to SDU to follow for d/c needs

## 2015-12-11 NOTE — ED Notes (Signed)
Patient transported to X-ray 

## 2015-12-11 NOTE — ED Provider Notes (Addendum)
CSN: [id number]     Arrival date & time 12/11/15  0351 History   First MD Initiated Contact with Patient 12/11/15 0407     Chief Complaint  Patient presents with  . Loss of Consciousness  . Hypotension     HPI Patient is brought to the emergency department after a syncopal episode today.  Patient has had lightheadedness for 1-2 days.  Today was emptying her ostomy bag when she became lightheaded, felt weak all over and passed out on the bed.  No preceding palpitations.  Paramedics gave 500 cc of fluid and report on orthostatic changes on their evaluation.  Patient is brought to the emergency department with a blood pressure of 73/42.  Patient has a history of ulcerative colitis.  She underwent redo ostomy in December 2016.  She was most recently on TPN through a PICC line.  Family reports that the TPN was stopped one week ago and the PICC line was removed 5 days ago.  Patient has been eating and drinking.  As best she can tell the patient believes she is keeping up with her hydration.  She does report decreased urine output however.. Denies nausea vomiting.  Denies abdominal pain.  No chest pain shortness of breath.  Denies fevers or chills.  Denies dysuria or urinary frequency.     Past Medical History  Diagnosis Date  . Hypertension   . Colitis, ulcerative ([place])   . Hyperlipidemia   . Colon polyps   . Blood transfusion   . Anemia   . Shortness of breath dyspnea     talking or walking   . Type 2 diabetes mellitus with complication, without long-term current use of insulin ([place]) 09/05/2015    currently on no medications   . Numbness and tingling     hands and feet bilat    Past Surgical History  Procedure Laterality Date  . Gravida 6 para 2      all SAB  . Hyadiform mole    . Dilation and curettage of uterus      most likely after miscarriage  . Knee arthroscopy w/ meniscal repair      right knee ([place])  . Fibrocystic breast disease      q 6 month mammogram  . Abdominal  hysterectomy  2000  . Small bowel obstruction    . Robotic assisted laparoscopic lysis of adhesion N/A 09/25/2015    Procedure: XI ROBOTIC ASSISTED LAPAROSCOPIC LYSIS OF ADHESION;  Surgeon: Leighton Ruff, MD;  Location: [facility];  Service: General;  Laterality: N/A;  90 minutes  . Laparoscopic small bowel resection N/A 09/25/2015    Procedure: LAPAROSCOPIC BOWEL RESECTION TIMES TWO;  Surgeon: Leighton Ruff, MD;  Location: [facility];  Service: General;  Laterality: N/A;  . Ileo loop diversion N/A 09/25/2015    Procedure: ILEO LOOP COLOSTOMY;  Surgeon: Leighton Ruff, MD;  Location: [facility];  Service: General;  Laterality: N/A;  . Ileostomy closure N/A 10/08/2015    Procedure: ILEOSTOMY REVISION;  Surgeon: Leighton Ruff, MD;  Location: [facility];  Service: General;  Laterality: N/A;  . Abdominal surgery    . Colon surgery      colostomy   Family History  Problem Relation Age of Onset  . Diabetes Mother   . Breast cancer Mother 31  . Diabetes Father   . Heart disease Father   . Diabetes Sister   . Colon cancer Paternal Aunt   . Diabetes Brother   . Breast cancer Sister 57  Social History  Substance Use Topics  . Smoking status: Never Smoker   . Smokeless tobacco: Never Used  . Alcohol Use: 1.0 oz/week    2 Standard drinks or equivalent per week     Comment: occ   OB History    No data available     Review of Systems  All other systems reviewed and are negative.     Allergies  Review of patient's allergies indicates no known allergies.  Home Medications   Prior to Admission medications   Medication Sig Start Date End Date Taking? Authorizing Provider  acetaminophen (TYLENOL) 650 MG CR tablet Take 650 mg by mouth every 8 (eight) hours as needed for pain.   Yes Historical Provider, MD  ALPRAZolam (XANAX) 0.25 MG tablet Take 1 tablet (0.25 mg total) by mouth 2 (two) times daily as needed for anxiety. 09/05/15  Yes Janith Lima, MD  BENICAR HCT 40-12.5 MG tablet Take 1 tablet by  mouth daily. 09/04/15  Yes Historical Provider, MD  hydrocortisone cream 1 % Apply topically as needed for itching. Patient taking differently: Apply 1 application topically 2 (two) times daily as needed for itching.  14/43/15  Yes Leighton Ruff, MD  ondansetron (ZOFRAN-ODT) 4 MG disintegrating tablet Take 1 tablet (4 mg total) by mouth every 6 (six) hours as needed for nausea. 40/08/67  Yes Leighton Ruff, MD  oxyCODONE (ROXICODONE) 5 MG/5ML solution Take 5-10 mLs (5-10 mg total) by mouth every 4 (four) hours as needed for moderate pain. 61/95/09  Yes Leighton Ruff, MD  cephALEXin (KEFLEX) 500 MG capsule Take 2 capsules (1,000 mg total) by mouth 2 (two) times daily. Patient not taking: Reported on 12/11/2015 11/01/15   Domenic Moras, PA-C  famotidine (PEPCID) 20 MG tablet Take 1 tablet (20 mg total) by mouth 2 (two) times daily. Patient not taking: Reported on 01/09/6711 45/80/99   Leighton Ruff, MD   BP 90/58 mmHg  Pulse 89  Temp(Src) 97.8 F (36.6 C)  Resp 13  Ht 5' 3"  (1.6 m)  Wt 179 lb (81.194 kg)  BMI 31.72 kg/m2  SpO2 100% Physical Exam  Constitutional: She is oriented to person, place, and time. She appears well-developed and well-nourished. No distress.  HENT:  Head: Normocephalic and atraumatic.  Eyes: EOM are normal.  Neck: Normal range of motion.  Cardiovascular: Normal rate, regular rhythm and normal heart sounds.   Pulmonary/Chest: Effort normal and breath sounds normal.  Abdominal: Soft. She exhibits no distension. There is no tenderness.  Musculoskeletal: Normal range of motion.  Neurological: She is alert and oriented to person, place, and time.  Skin: Skin is warm and dry.  Psychiatric: She has a normal mood and affect. Judgment normal.  Nursing note and vitals reviewed.   ED Course  Procedures   CRITICAL CARE Performed by: Hoy Morn Total critical care time: 33 minutes Critical care time was exclusive of separately billable procedures and treating other  patients. Critical care was necessary to treat or prevent imminent or life-threatening deterioration. Critical care was time spent personally by me on the following activities: development of treatment plan with patient and/or surrogate as well as nursing, discussions with consultants, evaluation of patient's response to treatment, examination of patient, obtaining history from patient or surrogate, ordering and performing treatments and interventions, ordering and review of laboratory studies, ordering and review of radiographic studies, pulse oximetry and re-evaluation of patient's condition.   Labs Review Labs Reviewed  CBC - Abnormal; Notable for the following:  WBC 11.8 (*)    RBC 3.38 (*)    Hemoglobin 9.4 (*)    HCT 29.3 (*)    RDW 15.6 (*)    All other components within normal limits  URINALYSIS, ROUTINE W REFLEX MICROSCOPIC (NOT AT [facility]) - Abnormal; Notable for the following:    APPearance CLOUDY (*)    Hgb urine dipstick TRACE (*)    Leukocytes, UA SMALL (*)    All other components within normal limits  COMPREHENSIVE METABOLIC PANEL - Abnormal; Notable for the following:    Sodium 134 (*)    CO2 17 (*)    Glucose, Bld 106 (*)    BUN 56 (*)    Creatinine, Ser 2.95 (*)    Calcium 8.2 (*)    Albumin 3.2 (*)    GFR calc non Af Amer 16 (*)    GFR calc Af Amer 19 (*)    All other components within normal limits  URINE MICROSCOPIC-ADD ON - Abnormal; Notable for the following:    Squamous Epithelial / LPF 6-30 (*)    Bacteria, UA FEW (*)    Casts HYALINE CASTS (*)    All other components within normal limits  CBG MONITORING, ED - Abnormal; Notable for the following:    Glucose-Capillary 105 (*)    All other components within normal limits  TROPONIN I  I-STAT CG4 LACTIC ACID, ED  I-STAT CG4 LACTIC ACID, ED    Imaging Review Dg Chest 2 View  12/11/2015  CLINICAL DATA:  Shortness of breath and hypotension EXAM: CHEST  2 VIEW COMPARISON:  09/01/2015 FINDINGS: Minimal  linear atelectasis. There is no edema, consolidation, effusion, or pneumothorax. Normal heart size and aortic contours. IMPRESSION: No edema or pneumonia. Electronically Signed   By: Monte Fantasia M.D.   On: 12/11/2015 05:09   I have personally reviewed and evaluated these images and lab results as part of my medical decision-making.   EKG Interpretation   Date/Time:  Wednesday December 11 2015 04:01:48 EST Ventricular Rate:  90 PR Interval:  155 QRS Duration: 74 QT Interval:  410 QTC Calculation: 502 R Axis:   26 Text Interpretation:  Sinus rhythm Borderline ST elevation, lateral leads  Borderline prolonged QT interval No significant change was found Confirmed  by Leotis Isham  MD, Conception Doebler ([id number]) on 12/11/2015 7:05:00 AM      MDM   Final diagnoses:  Syncope, unspecified syncope type  Hypotension, unspecified hypotension type  Acute renal failure, unspecified acute renal failure type ([place])    Clinically I think the patient is dehydrated.  She seems to be responding to IV fluids.  This is likely related to no longer being on TPN and switching over to oral hydration and intake.  She reports no change in color in her ostomy output to suggest GI bleed.  No fevers or chills.  Lactate reassuring.  Patient is in acute renal failure with a baseline creatinine of 0.8.  Today her creatinine is 2.9.  Patient be admitted to the hospital for ongoing IV hydration and management of her acute renal failure.  Potassium is normal.  Spoke with Dr Loleta Books of [facility] team for admission  7:05 AM BP 87/57   Jola Schmidt, MD 12/11/15 [place], MD 12/11/15 [place], MD 12/11/15 [phone]

## 2015-12-11 NOTE — ED Notes (Addendum)
Blood draw attempt by this RN unsuccessful MD aware, states he will attempt ultrasound to obtain blood

## 2015-12-12 LAB — COMPREHENSIVE METABOLIC PANEL
ALBUMIN: 2.9 g/dL — AB (ref 3.5–5.0)
ALK PHOS: 69 U/L (ref 38–126)
ALT: 24 U/L (ref 14–54)
ANION GAP: 9 (ref 5–15)
AST: 25 U/L (ref 15–41)
BUN: 33 mg/dL — ABNORMAL HIGH (ref 6–20)
CALCIUM: 8.5 mg/dL — AB (ref 8.9–10.3)
CHLORIDE: 114 mmol/L — AB (ref 101–111)
CO2: 15 mmol/L — AB (ref 22–32)
CREATININE: 1.72 mg/dL — AB (ref 0.44–1.00)
GFR calc Af Amer: 36 mL/min — ABNORMAL LOW (ref >60)
GFR calc non Af Amer: 31 mL/min — ABNORMAL LOW (ref >60)
GLUCOSE: 95 mg/dL (ref 65–99)
Potassium: 4.4 mmol/L (ref 3.5–5.1)
SODIUM: 138 mmol/L (ref 135–145)
Total Bilirubin: 0.6 mg/dL (ref 0.3–1.2)
Total Protein: 6.3 g/dL — ABNORMAL LOW (ref 6.5–8.1)

## 2015-12-12 LAB — HEMOGLOBIN A1C
Hgb A1c MFr Bld: 6.3 % — ABNORMAL HIGH (ref 4.8–5.6)
MEAN PLASMA GLUCOSE: 134 mg/dL

## 2015-12-12 LAB — CBC
HCT: 27.4 % — ABNORMAL LOW (ref 36.0–46.0)
HEMOGLOBIN: 8.8 g/dL — AB (ref 12.0–15.0)
MCH: 27.8 pg (ref 26.0–34.0)
MCHC: 32.1 g/dL (ref 30.0–36.0)
MCV: 86.7 fL (ref 78.0–100.0)
Platelets: 309 10*3/uL (ref 150–400)
RBC: 3.16 MIL/uL — AB (ref 3.87–5.11)
RDW: 15.5 % (ref 11.5–15.5)
WBC: 7.6 10*3/uL (ref 4.0–10.5)

## 2015-12-12 LAB — GLUCOSE, CAPILLARY
GLUCOSE-CAPILLARY: 73 mg/dL (ref 65–99)
GLUCOSE-CAPILLARY: 93 mg/dL (ref 65–99)

## 2015-12-12 MED ORDER — POTASSIUM CHLORIDE 2 MEQ/ML IV SOLN: INTRAVENOUS | Status: DC

## 2015-12-12 MED ORDER — DIPHENOXYLATE-ATROPINE 2.5-0.025 MG PO TABS
1.0000 | ORAL_TABLET | Freq: Four times a day (QID) | ORAL | Status: DC
  Administered 2015-12-12 – 2015-12-13 (×4): 1 via ORAL
  Filled 2015-12-12 (×4): qty 1

## 2015-12-12 MED ORDER — KCL IN DEXTROSE-NACL 20-5-0.45 MEQ/L-%-% IV SOLN
INTRAVENOUS | Status: DC
  Administered 2015-12-12: 17:00:00 via INTRAVENOUS
  Filled 2015-12-12 (×2): qty 1000

## 2015-12-12 NOTE — Progress Notes (Signed)
<principal problem not specified>  Subjective: Did well overnight.  Having high ostomy output.  UOP adequate  Objective: Vital signs in last 24 hours: Temp:  [98.2 F (36.8 C)-98.8 F (37.1 C)] 98.8 F (37.1 C) (02/02 0312) Pulse Rate:  [81-115] 89 (02/02 0631) Resp:  [11-28] 20 (02/02 0631) BP: (78-106)/(43-74) 99/51 mmHg (02/02 0631) SpO2:  [92 %-100 %] 100 % (02/02 0631) Last BM Date:  (ileostomy)  Intake/Output from previous day: 02/01 0701 - 02/02 0700 In: -  Out: 2750 [Urine:1350; Stool:1400] Intake/Output this shift:    General appearance: alert and cooperative GI: soft, nondistended, ostomy with liquid output  Lab Results:  Results for orders placed or performed during the hospital encounter of 12/11/15 (from the past 24 hour(s))  I-Stat CG4 Lactic Acid, ED     Status: None   Collection Time: 12/11/15  8:37 AM  Result Value Ref Range   Lactic Acid, Venous 1.05 0.5 - 2.0 mmol/L  Cortisol     Status: None   Collection Time: 12/11/15  8:38 AM  Result Value Ref Range   Cortisol, Plasma 4.2 ug/dL  CBC with Differential     Status: Abnormal   Collection Time: 12/11/15  8:38 AM  Result Value Ref Range   WBC 11.1 (H) 4.0 - 10.5 K/uL   RBC 3.38 (L) 3.87 - 5.11 MIL/uL   Hemoglobin 9.4 (L) 12.0 - 15.0 g/dL   HCT 29.8 (L) 36.0 - 46.0 %   MCV 88.2 78.0 - 100.0 fL   MCH 27.8 26.0 - 34.0 pg   MCHC 31.5 30.0 - 36.0 g/dL   RDW 15.6 (H) 11.5 - 15.5 %   Platelets 376 150 - 400 K/uL   Neutrophils Relative % 68 %   Lymphocytes Relative 26 %   Monocytes Relative 4 %   Eosinophils Relative 2 %   Basophils Relative 0 %   Neutro Abs 7.6 1.7 - 7.7 K/uL   Lymphs Abs 2.9 0.7 - 4.0 K/uL   Monocytes Absolute 0.4 0.1 - 1.0 K/uL   Eosinophils Absolute 0.2 0.0 - 0.7 K/uL   Basophils Absolute 0.0 0.0 - 0.1 K/uL   Smear Review LARGE PLATELETS PRESENT   Comprehensive metabolic panel     Status: Abnormal   Collection Time: 12/11/15  8:38 AM  Result Value Ref Range   Sodium 134 (L)  135 - 145 mmol/L   Potassium 4.7 3.5 - 5.1 mmol/L   Chloride 111 101 - 111 mmol/L   CO2 14 (L) 22 - 32 mmol/L   Glucose, Bld 94 65 - 99 mg/dL   BUN 49 (H) 6 - 20 mg/dL   Creatinine, Ser 2.67 (H) 0.44 - 1.00 mg/dL   Calcium 8.3 (L) 8.9 - 10.3 mg/dL   Total Protein 6.4 (L) 6.5 - 8.1 g/dL   Albumin 3.0 (L) 3.5 - 5.0 g/dL   AST 25 15 - 41 U/L   ALT 27 14 - 54 U/L   Alkaline Phosphatase 70 38 - 126 U/L   Total Bilirubin 0.1 (L) 0.3 - 1.2 mg/dL   GFR calc non Af Amer 18 (L) >60 mL/min   GFR calc Af Amer 21 (L) >60 mL/min   Anion gap 9 5 - 15  Lactic acid, plasma     Status: None   Collection Time: 12/11/15  8:38 AM  Result Value Ref Range   Lactic Acid, Venous 0.9 0.5 - 2.0 mmol/L  Protime-INR     Status: None   Collection Time: 12/11/15  8:38 AM  Result Value Ref Range   Prothrombin Time 14.9 11.6 - 15.2 seconds   INR 1.16 0.00 - 1.49  APTT     Status: Abnormal   Collection Time: 12/11/15  8:38 AM  Result Value Ref Range   aPTT 23 (L) 24 - 37 seconds  Troponin I     Status: None   Collection Time: 12/11/15  8:38 AM  Result Value Ref Range   Troponin I <0.03 <0.031 ng/mL  Magnesium     Status: None   Collection Time: 12/11/15  8:38 AM  Result Value Ref Range   Magnesium 1.8 1.7 - 2.4 mg/dL  Phosphorus     Status: None   Collection Time: 12/11/15  8:38 AM  Result Value Ref Range   Phosphorus 4.1 2.5 - 4.6 mg/dL  CBG monitoring, ED     Status: None   Collection Time: 12/11/15 12:15 PM  Result Value Ref Range   Glucose-Capillary 81 65 - 99 mg/dL  Glucose, capillary     Status: None   Collection Time: 12/11/15  6:04 PM  Result Value Ref Range   Glucose-Capillary 65 65 - 99 mg/dL  Glucose, capillary     Status: None   Collection Time: 12/11/15  6:58 PM  Result Value Ref Range   Glucose-Capillary 82 65 - 99 mg/dL  MRSA PCR Screening     Status: None   Collection Time: 12/11/15  7:34 PM  Result Value Ref Range   MRSA by PCR NEGATIVE NEGATIVE  Glucose, capillary      Status: None   Collection Time: 12/11/15  9:29 PM  Result Value Ref Range   Glucose-Capillary 85 65 - 99 mg/dL  Comprehensive metabolic panel     Status: Abnormal   Collection Time: 12/12/15  3:31 AM  Result Value Ref Range   Sodium 138 135 - 145 mmol/L   Potassium 4.4 3.5 - 5.1 mmol/L   Chloride 114 (H) 101 - 111 mmol/L   CO2 15 (L) 22 - 32 mmol/L   Glucose, Bld 95 65 - 99 mg/dL   BUN 33 (H) 6 - 20 mg/dL   Creatinine, Ser 1.72 (H) 0.44 - 1.00 mg/dL   Calcium 8.5 (L) 8.9 - 10.3 mg/dL   Total Protein 6.3 (L) 6.5 - 8.1 g/dL   Albumin 2.9 (L) 3.5 - 5.0 g/dL   AST 25 15 - 41 U/L   ALT 24 14 - 54 U/L   Alkaline Phosphatase 69 38 - 126 U/L   Total Bilirubin 0.6 0.3 - 1.2 mg/dL   GFR calc non Af Amer 31 (L) >60 mL/min   GFR calc Af Amer 36 (L) >60 mL/min   Anion gap 9 5 - 15  CBC     Status: Abnormal   Collection Time: 12/12/15  3:31 AM  Result Value Ref Range   WBC 7.6 4.0 - 10.5 K/uL   RBC 3.16 (L) 3.87 - 5.11 MIL/uL   Hemoglobin 8.8 (L) 12.0 - 15.0 g/dL   HCT 27.4 (L) 36.0 - 46.0 %   MCV 86.7 78.0 - 100.0 fL   MCH 27.8 26.0 - 34.0 pg   MCHC 32.1 30.0 - 36.0 g/dL   RDW 15.5 11.5 - 15.5 %   Platelets 309 150 - 400 K/uL     Studies/Results Radiology     MEDS, Scheduled . enoxaparin (LOVENOX) injection  30 mg Subcutaneous Q24H  . insulin aspart  0-9 Units Subcutaneous TID WC  . loperamide  2 mg  Oral TID  . polycarbophil  625 mg Oral Daily  . sodium chloride flush  3 mL Intravenous Q12H     Assessment: Dehydration: improving   Plan: Transfer to floor Cont reg diet Hold BP meds Add lomotil to regimen to help slow ostomy output  LOS: 1 day    Latasha Wood, [facility], [place]   12/12/2015 7:52 AM

## 2015-12-12 NOTE — Consult Note (Signed)
WOC by to check on patient's status.  Husband reports they changed pouch yesterday just after ostomy supplies arrived due to leak, but report they are doing well with current pouching system.  They have the supplies in the room that they need.  Spent some time with patient explaining rationale for replacement of fluids with ileostomy.  She did not recall that she really needs to drink a glass of water based fluid each time she empties her pouch.  We discussed adding fiber to her diet and Dr. Marcello Moores has started her on some fiber supplement by mouth plus some imodium to slow her output a bit. She and her husband verbalized understanding.  No other needs related to ostomy care at the time of the visit.  [facility] team will remain available to patient and family for assistance with ostomy care as needed.  Kamran Coker [place] RN,CWOCN [phone]

## 2015-12-13 LAB — BASIC METABOLIC PANEL
ANION GAP: 7 (ref 5–15)
BUN: 19 mg/dL (ref 6–20)
CO2: 21 mmol/L — ABNORMAL LOW (ref 22–32)
Calcium: 9.5 mg/dL (ref 8.9–10.3)
Chloride: 112 mmol/L — ABNORMAL HIGH (ref 101–111)
Creatinine, Ser: 1.13 mg/dL — ABNORMAL HIGH (ref 0.44–1.00)
GFR, EST AFRICAN AMERICAN: 60 mL/min — AB (ref >60)
GFR, EST NON AFRICAN AMERICAN: 52 mL/min — AB (ref >60)
GLUCOSE: 90 mg/dL (ref 65–99)
POTASSIUM: 4.7 mmol/L (ref 3.5–5.1)
Sodium: 140 mmol/L (ref 135–145)

## 2015-12-13 MED ORDER — LOPERAMIDE HCL 2 MG PO CAPS
2.0000 mg | ORAL_CAPSULE | Freq: Three times a day (TID) | ORAL | Status: DC
Start: 2015-12-13 — End: 2016-08-24

## 2015-12-13 MED ORDER — DIPHENOXYLATE-ATROPINE 2.5-0.025 MG PO TABS: 1.0000 | ORAL_TABLET | Freq: Four times a day (QID) | ORAL | Status: DC

## 2015-12-13 NOTE — Discharge Summary (Signed)
Physician Discharge Summary  Patient ID: Latasha Wood MRN: [id number] DOB/AGE: [birth year] 60 y.o.  Admit date: 12/11/2015 Discharge date: 12/13/2015  Admission Diagnoses: Dehydration  Discharge Diagnoses:  Active Problems:   Renal failure   Hypovolemic shock [facility])   Discharged Condition: good  Hospital Course: Patient was monitored on stepdown on HD 1.  She was given IVF's and her uop picked up.  Her Cr was decreasing appropriately on HD 2 and she was sent to a floor bed.  By [facility] her ostomy output was controlled with medications and her Cr was almost normal.  SHe was in stable condition for d/c home.  Consults: None  Significant Diagnostic Studies: labs: cbc, chemistry  Treatments: IV hydration  Discharge Exam: Blood pressure 121/64, pulse 74, temperature 98.4 F (36.9 C), temperature source Oral, resp. rate 16, height 5' 3"  (1.6 m), weight 81.194 kg (179 lb), SpO2 100 %. General appearance: alert and cooperative GI: normal findings: soft, non-tender  Ostomy output < 1L  Disposition: 01-Home or Self Care     Medication List    STOP taking these medications        BENICAR HCT 40-12.5 MG tablet  Generic drug:  olmesartan-hydrochlorothiazide      TAKE these medications        acetaminophen 650 MG CR tablet  Commonly known as:  TYLENOL  Take 650 mg by mouth every 8 (eight) hours as needed for pain.     ALPRAZolam 0.25 MG tablet  Commonly known as:  XANAX  Take 1 tablet (0.25 mg total) by mouth 2 (two) times daily as needed for anxiety.     cephALEXin 500 MG capsule  Commonly known as:  KEFLEX  Take 2 capsules (1,000 mg total) by mouth 2 (two) times daily.     diphenoxylate-atropine 2.5-0.025 MG tablet  Commonly known as:  LOMOTIL  Take 1 tablet by mouth 4 (four) times daily.     famotidine 20 MG tablet  Commonly known as:  PEPCID  Take 1 tablet (20 mg total) by mouth 2 (two) times daily.     hydrocortisone cream 1 %  Apply topically as needed for  itching.     loperamide 2 MG capsule  Commonly known as:  IMODIUM  Take 1 capsule (2 mg total) by mouth 3 (three) times daily.     ondansetron 4 MG disintegrating tablet  Commonly known as:  ZOFRAN-ODT  Take 1 tablet (4 mg total) by mouth every 6 (six) hours as needed for nausea.     oxyCODONE 5 MG/5ML solution  Commonly known as:  ROXICODONE  Take 5-10 mLs (5-10 mg total) by mouth every 4 (four) hours as needed for moderate pain.           Follow-up Information    Follow up with Rosario Adie., MD.   Specialty:  General Surgery   Contact information:   [place] [address] [phone]       Follow up with Rosario Adie., MD. Schedule an appointment as soon as possible for a visit in 2 weeks.   Specialty:  General Surgery   Contact information:   [place] [address] [phone]       Signed: Rosario Adie [birth year], 7:40 AM

## 2015-12-13 NOTE — Discharge Instructions (Signed)
Acute Kidney Injury Acute kidney injury is any condition in which there is sudden (acute) damage to the kidneys. Acute kidney injury was previously known as acute kidney failure or acute renal failure. The kidneys are two organs that lie on either side of the spine between the middle of the back and the front of the abdomen. The kidneys:  Remove wastes and extra water from the blood.   Produce important hormones. These help keep bones strong, regulate blood pressure, and help create red blood cells.   Balance the fluids and chemicals in the blood and tissues. A small amount of kidney damage may not cause problems, but a large amount of damage may make it difficult or impossible for the kidneys to work the way they should. Acute kidney injury may develop into long-lasting (chronic) kidney disease. It may also develop into a life-threatening disease called end-stage kidney disease. Acute kidney injury can get worse very quickly, so it should be treated right away. Early treatment may prevent other kidney diseases from developing. CAUSES   A problem with blood flow to the kidneys. This may be caused by:   Blood loss.   Heart disease.   Severe burns.   Liver disease.  Direct damage to the kidneys. This may be caused by:  Some medicines.   A kidney infection.   Poisoning or consuming toxic substances.   A surgical wound.   A blow to the kidney area.   A problem with urine flow. This may be caused by:   Cancer.   Kidney stones.   An enlarged prostate. SIGNS AND SYMPTOMS   Swelling (edema) of the legs, ankles, or feet.   Tiredness (lethargy).   Nausea or vomiting.   Confusion.   Problems with urination, such as:   Painful or burning feeling during urination.   Decreased urine production.   Frequent accidents in children who are potty trained.   Bloody urine.   Muscle twitches and cramps.   Shortness of breath.   Seizures.   Chest  pain or pressure. Sometimes, no symptoms are present. DIAGNOSIS Acute kidney injury may be detected and diagnosed by tests, including blood, urine, imaging, or kidney biopsy tests.  TREATMENT Treatment of acute kidney injury varies depending on the cause and severity of the kidney damage. In mild cases, no treatment may be needed. The kidneys may heal on their own. If acute kidney injury is more severe, your health care provider will treat the cause of the kidney damage, help the kidneys heal, and prevent complications from occurring. Severe cases may require a procedure to remove toxic wastes from the body (dialysis) or surgery to repair kidney damage. Surgery may involve:   Repair of a torn kidney.   Removal of an obstruction. HOME CARE INSTRUCTIONS  Follow your prescribed diet.  Take medicines only as directed by your health care provider.  Do not take any new medicines (prescription, over-the-counter, or nutritional supplements) unless approved by your health care provider. Many medicines can worsen your kidney damage or may need to have the dose adjusted.   Keep all follow-up visits as directed by your health care provider. This is important.  Observe your condition to make sure you are healing as expected. SEEK IMMEDIATE MEDICAL CARE IF:  You are feeling ill or have severe pain in the back or side.   Your symptoms return or you have new symptoms.  You have any symptoms of end-stage kidney disease. These include:   Persistent itchiness.  Loss of appetite.   Headaches.   Abnormally dark or light skin.  Numbness in the hands or feet.   Easy bruising.   Frequent hiccups.   Menstruation stops.   You have a fever.  You have increased urine production.  You have pain or bleeding when urinating. MAKE SURE YOU:   Understand these instructions.  Will watch your condition.  Will get help right away if you are not doing well or get worse.   This  information is not intended to replace advice given to you by your health care provider. Make sure you discuss any questions you have with your health care provider.   Document Released: 05/11/2011 Document Revised: 11/16/2014 Document Reviewed: 06/24/2012 Elsevier Interactive Patient Education 2016 [place] your ostomy output from 750-1214m per day.  You can titrate your imodium and lomotil doses to achieve this.  If you have any questions, please call the office.  Your urine should be light in color as well.  If your urine becomes dark or you are not urinating frequently, drink more non-caffeinated fluids.

## 2015-12-13 NOTE — Progress Notes (Signed)
Discharge instructions given to pt/spouse, verbalized understanding. Left the unit in stable condition.

## 2015-12-16 LAB — CULTURE, BLOOD (ROUTINE X 2): Culture: NO GROWTH

## 2016-02-27 ENCOUNTER — Telehealth: Payer: Self-pay | Admitting: *Deleted

## 2016-02-27 NOTE — Telephone Encounter (Signed)
Dr. Havery Moros received a disability form from. He is not going to complete this form. Needs to come from her surgeon. [employer] notified.

## 2016-03-23 ENCOUNTER — Encounter: Payer: Self-pay | Admitting: Internal Medicine

## 2016-03-23 ENCOUNTER — Ambulatory Visit (INDEPENDENT_AMBULATORY_CARE_PROVIDER_SITE_OTHER): Payer: 59 | Admitting: Internal Medicine

## 2016-03-23 ENCOUNTER — Telehealth: Payer: Self-pay | Admitting: Internal Medicine

## 2016-03-23 ENCOUNTER — Other Ambulatory Visit (INDEPENDENT_AMBULATORY_CARE_PROVIDER_SITE_OTHER): Payer: 59

## 2016-03-23 VITALS — BP 122/84 | HR 89 | Temp 98.8°F | Resp 16 | Ht 63.0 in | Wt 174.0 lb

## 2016-03-23 DIAGNOSIS — R7989 Other specified abnormal findings of blood chemistry: Secondary | ICD-10-CM | POA: Diagnosis not present

## 2016-03-23 DIAGNOSIS — K51918 Ulcerative colitis, unspecified with other complication: Secondary | ICD-10-CM | POA: Diagnosis not present

## 2016-03-23 DIAGNOSIS — D510 Vitamin B12 deficiency anemia due to intrinsic factor deficiency: Secondary | ICD-10-CM

## 2016-03-23 DIAGNOSIS — Z0001 Encounter for general adult medical examination with abnormal findings: Secondary | ICD-10-CM

## 2016-03-23 DIAGNOSIS — F419 Anxiety disorder, unspecified: Secondary | ICD-10-CM

## 2016-03-23 DIAGNOSIS — Z0000 Encounter for general adult medical examination without abnormal findings: Secondary | ICD-10-CM | POA: Diagnosis not present

## 2016-03-23 DIAGNOSIS — Z23 Encounter for immunization: Secondary | ICD-10-CM | POA: Diagnosis not present

## 2016-03-23 DIAGNOSIS — K5190 Ulcerative colitis, unspecified, without complications: Secondary | ICD-10-CM | POA: Insufficient documentation

## 2016-03-23 DIAGNOSIS — E118 Type 2 diabetes mellitus with unspecified complications: Secondary | ICD-10-CM | POA: Diagnosis not present

## 2016-03-23 DIAGNOSIS — Z1231 Encounter for screening mammogram for malignant neoplasm of breast: Secondary | ICD-10-CM | POA: Insufficient documentation

## 2016-03-23 DIAGNOSIS — M0760 Enteropathic arthropathies, unspecified site: Secondary | ICD-10-CM | POA: Diagnosis not present

## 2016-03-23 LAB — IBC PANEL
IRON: 54 ug/dL (ref 42–145)
Saturation Ratios: 14.8 % — ABNORMAL LOW (ref 20.0–50.0)
Transferrin: 260 mg/dL (ref 212.0–360.0)

## 2016-03-23 LAB — LIPID PANEL
Cholesterol: 198 mg/dL (ref 0–200)
HDL: 49.2 mg/dL (ref >39.00)
NONHDL: 149.02
TRIGLYCERIDES: 275 mg/dL — AB (ref 0.0–149.0)
Total CHOL/HDL Ratio: 4
VLDL: 55 mg/dL — ABNORMAL HIGH (ref 0.0–40.0)

## 2016-03-23 LAB — URINALYSIS, ROUTINE W REFLEX MICROSCOPIC
Bilirubin Urine: NEGATIVE
Ketones, ur: NEGATIVE
Leukocytes, UA: NEGATIVE
NITRITE: NEGATIVE
PH: 5.5 (ref 5.0–8.0)
RBC / HPF: NONE SEEN (ref 0–?)
SPECIFIC GRAVITY, URINE: 1.02 (ref 1.000–1.030)
Total Protein, Urine: NEGATIVE
UROBILINOGEN UA: 0.2 (ref 0.0–1.0)
Urine Glucose: NEGATIVE

## 2016-03-23 LAB — CBC WITH DIFFERENTIAL/PLATELET
BASOS ABS: 0 10*3/uL (ref 0.0–0.1)
Basophils Relative: 0.3 % (ref 0.0–3.0)
EOS ABS: 0.4 10*3/uL (ref 0.0–0.7)
Eosinophils Relative: 5.9 % — ABNORMAL HIGH (ref 0.0–5.0)
HEMATOCRIT: 35.9 % — AB (ref 36.0–46.0)
HEMOGLOBIN: 11.9 g/dL — AB (ref 12.0–15.0)
LYMPHS PCT: 31 % (ref 12.0–46.0)
Lymphs Abs: 2.2 10*3/uL (ref 0.7–4.0)
MCHC: 33.3 g/dL (ref 30.0–36.0)
MCV: 83.3 fl (ref 78.0–100.0)
MONO ABS: 0.4 10*3/uL (ref 0.1–1.0)
Monocytes Relative: 5.8 % (ref 3.0–12.0)
Neutro Abs: 4.1 10*3/uL (ref 1.4–7.7)
Neutrophils Relative %: 57 % (ref 43.0–77.0)
Platelets: 326 10*3/uL (ref 150.0–400.0)
RBC: 4.3 Mil/uL (ref 3.87–5.11)
RDW: 14.7 % (ref 11.5–15.5)
WBC: 7.2 10*3/uL (ref 4.0–10.5)

## 2016-03-23 LAB — TSH: TSH: 1.73 u[IU]/mL (ref 0.35–4.50)

## 2016-03-23 LAB — COMPREHENSIVE METABOLIC PANEL
ALBUMIN: 4 g/dL (ref 3.5–5.2)
ALT: 20 U/L (ref 0–35)
AST: 25 U/L (ref 0–37)
Alkaline Phosphatase: 82 U/L (ref 39–117)
BILIRUBIN TOTAL: 0.3 mg/dL (ref 0.2–1.2)
BUN: 18 mg/dL (ref 6–23)
CHLORIDE: 100 meq/L (ref 96–112)
CO2: 29 mEq/L (ref 19–32)
CREATININE: 1.05 mg/dL (ref 0.40–1.20)
Calcium: 10 mg/dL (ref 8.4–10.5)
GFR: 68.51 mL/min (ref >60.00)
Glucose, Bld: 99 mg/dL (ref 70–99)
Potassium: 4 mEq/L (ref 3.5–5.1)
SODIUM: 136 meq/L (ref 135–145)
Total Protein: 7.7 g/dL (ref 6.0–8.3)

## 2016-03-23 LAB — HEMOGLOBIN A1C: HEMOGLOBIN A1C: 6.4 % (ref 4.6–6.5)

## 2016-03-23 LAB — LDL CHOLESTEROL, DIRECT: Direct LDL: 91 mg/dL

## 2016-03-23 LAB — MICROALBUMIN / CREATININE URINE RATIO
CREATININE, U: 204.5 mg/dL
MICROALB UR: 1.1 mg/dL (ref 0.0–1.9)
MICROALB/CREAT RATIO: 0.5 mg/g (ref 0.0–30.0)

## 2016-03-23 LAB — VITAMIN B12: VITAMIN B 12: 563 pg/mL (ref 211–911)

## 2016-03-23 LAB — FOLATE: FOLATE: 21.5 ng/mL (ref >5.9)

## 2016-03-23 LAB — FERRITIN: Ferritin: 93.8 ng/mL (ref 10.0–291.0)

## 2016-03-23 MED ORDER — TRAMADOL HCL 50 MG PO TABS: 50.0000 mg | ORAL_TABLET | Freq: Three times a day (TID) | ORAL | Status: DC

## 2016-03-23 MED ORDER — ALPRAZOLAM 0.25 MG PO TABS: 0.2500 mg | ORAL_TABLET | Freq: Two times a day (BID) | ORAL | Status: DC | PRN

## 2016-03-23 NOTE — Progress Notes (Signed)
Pre visit review using our clinic review tool, if applicable. No additional management support is needed unless otherwise documented below in the visit note. 

## 2016-03-23 NOTE — Telephone Encounter (Signed)
Pt called to check up on the jury duty letter, please call her once its done. [phone]

## 2016-03-23 NOTE — Telephone Encounter (Signed)
Letter written will be at front. Pt informed

## 2016-03-23 NOTE — Patient Instructions (Signed)
Preventive Care for Adults, Female A healthy lifestyle and preventive care can promote health and wellness. Preventive health guidelines for women include the following key practices.  A routine yearly physical is a good way to check with your health care provider about your health and preventive screening. It is a chance to share any concerns and updates on your health and to receive a thorough exam.  Visit your dentist for a routine exam and preventive care every 6 months. Brush your teeth twice a day and floss once a day. Good oral hygiene prevents tooth decay and gum disease.  The frequency of eye exams is based on your age, health, family medical history, use of contact lenses, and other factors. Follow your health care provider's recommendations for frequency of eye exams.  Eat a healthy diet. Foods like vegetables, fruits, whole grains, low-fat dairy products, and lean protein foods contain the nutrients you need without too many calories. Decrease your intake of foods high in solid fats, added sugars, and salt. Eat the right amount of calories for you.Get information about a proper diet from your health care provider, if necessary.  Regular physical exercise is one of the most important things you can do for your health. Most adults should get at least 150 minutes of moderate-intensity exercise (any activity that increases your heart rate and causes you to sweat) each week. In addition, most adults need muscle-strengthening exercises on 2 or more days a week.  Maintain a healthy weight. The body mass index (BMI) is a screening tool to identify possible weight problems. It provides an estimate of body fat based on height and weight. Your health care provider can find your BMI and can help you achieve or maintain a healthy weight.For adults 20 years and older:  A BMI below 18.5 is considered underweight.  A BMI of 18.5 to 24.9 is normal.  A BMI of 25 to 29.9 is considered overweight.  A  BMI of 30 and above is considered obese.  Maintain normal blood lipids and cholesterol levels by exercising and minimizing your intake of saturated fat. Eat a balanced diet with plenty of fruit and vegetables. Blood tests for lipids and cholesterol should begin at age 45 and be repeated every 5 years. If your lipid or cholesterol levels are high, you are over 50, or you are at high risk for heart disease, you may need your cholesterol levels checked more frequently.Ongoing high lipid and cholesterol levels should be treated with medicines if diet and exercise are not working.  If you smoke, find out from your health care provider how to quit. If you do not use tobacco, do not start.  Lung cancer screening is recommended for adults aged 45-80 years who are at high risk for developing lung cancer because of a history of smoking. A yearly low-dose CT scan of the lungs is recommended for people who have at least a 30-pack-year history of smoking and are a current smoker or have quit within the past 15 years. A pack year of smoking is smoking an average of 1 pack of cigarettes a day for 1 year (for example: 1 pack a day for 30 years or 2 packs a day for 15 years). Yearly screening should continue until the smoker has stopped smoking for at least 15 years. Yearly screening should be stopped for people who develop a health problem that would prevent them from having lung cancer treatment.  If you are pregnant, do not drink alcohol. If you are  breastfeeding, be very cautious about drinking alcohol. If you are not pregnant and choose to drink alcohol, do not have more than 1 drink per day. One drink is considered to be 12 ounces (355 mL) of beer, 5 ounces (148 mL) of wine, or 1.5 ounces (44 mL) of liquor.  Avoid use of street drugs. Do not share needles with anyone. Ask for help if you need support or instructions about stopping the use of drugs.  High blood pressure causes heart disease and increases the risk  of stroke. Your blood pressure should be checked at least every 1 to 2 years. Ongoing high blood pressure should be treated with medicines if weight loss and exercise do not work.  If you are 55-79 years old, ask your health care provider if you should take aspirin to prevent strokes.  Diabetes screening is done by taking a blood sample to check your blood glucose level after you have not eaten for a certain period of time (fasting). If you are not overweight and you do not have risk factors for diabetes, you should be screened once every 3 years starting at age 45. If you are overweight or obese and you are 40-70 years of age, you should be screened for diabetes every year as part of your cardiovascular risk assessment.  Breast cancer screening is essential preventive care for women. You should practice "breast self-awareness." This means understanding the normal appearance and feel of your breasts and may include breast self-examination. Any changes detected, no matter how small, should be reported to a health care provider. Women in their 20s and 30s should have a clinical breast exam (CBE) by a health care provider as part of a regular health exam every 1 to 3 years. After age 40, women should have a CBE every year. Starting at age 40, women should consider having a mammogram (breast X-ray test) every year. Women who have a family history of breast cancer should talk to their health care provider about genetic screening. Women at a high risk of breast cancer should talk to their health care providers about having an MRI and a mammogram every year.  Breast cancer gene (BRCA)-related cancer risk assessment is recommended for women who have family members with BRCA-related cancers. BRCA-related cancers include breast, ovarian, tubal, and peritoneal cancers. Having family members with these cancers may be associated with an increased risk for harmful changes (mutations) in the breast cancer genes BRCA1 and  BRCA2. Results of the assessment will determine the need for genetic counseling and BRCA1 and BRCA2 testing.  Your health care provider may recommend that you be screened regularly for cancer of the pelvic organs (ovaries, uterus, and vagina). This screening involves a pelvic examination, including checking for microscopic changes to the surface of your cervix (Pap test). You may be encouraged to have this screening done every 3 years, beginning at age 21.  For women ages 30-65, health care providers may recommend pelvic exams and Pap testing every 3 years, or they may recommend the Pap and pelvic exam, combined with testing for human papilloma virus (HPV), every 5 years. Some types of HPV increase your risk of cervical cancer. Testing for HPV may also be done on women of any age with unclear Pap test results.  Other health care providers may not recommend any screening for nonpregnant women who are considered low risk for pelvic cancer and who do not have symptoms. Ask your health care provider if a screening pelvic exam is right for   you.  If you have had past treatment for cervical cancer or a condition that could lead to cancer, you need Pap tests and screening for cancer for at least 20 years after your treatment. If Pap tests have been discontinued, your risk factors (such as having a new sexual partner) need to be reassessed to determine if screening should resume. Some women have medical problems that increase the chance of getting cervical cancer. In these cases, your health care provider may recommend more frequent screening and Pap tests.  Colorectal cancer can be detected and often prevented. Most routine colorectal cancer screening begins at the age of 50 years and continues through age 75 years. However, your health care provider may recommend screening at an earlier age if you have risk factors for colon cancer. On a yearly basis, your health care provider may provide home test kits to check  for hidden blood in the stool. Use of a small camera at the end of a tube, to directly examine the colon (sigmoidoscopy or colonoscopy), can detect the earliest forms of colorectal cancer. Talk to your health care provider about this at age 50, when routine screening begins. Direct exam of the colon should be repeated every 5-10 years through age 75 years, unless early forms of precancerous polyps or small growths are found.  People who are at an increased risk for hepatitis B should be screened for this virus. You are considered at high risk for hepatitis B if:  You were born in a country where hepatitis B occurs often. Talk with your health care provider about which countries are considered high risk.  Your parents were born in a high-risk country and you have not received a shot to protect against hepatitis B (hepatitis B vaccine).  You have HIV or AIDS.  You use needles to inject street drugs.  You live with, or have sex with, someone who has hepatitis B.  You get hemodialysis treatment.  You take certain medicines for conditions like cancer, organ transplantation, and autoimmune conditions.  Hepatitis C blood testing is recommended for all people born from 1945 through 1965 and any individual with known risks for hepatitis C.  Practice safe sex. Use condoms and avoid high-risk sexual practices to reduce the spread of sexually transmitted infections (STIs). STIs include gonorrhea, chlamydia, syphilis, trichomonas, herpes, HPV, and human immunodeficiency virus (HIV). Herpes, HIV, and HPV are viral illnesses that have no cure. They can result in disability, cancer, and death.  You should be screened for sexually transmitted illnesses (STIs) including gonorrhea and chlamydia if:  You are sexually active and are younger than 24 years.  You are older than 24 years and your health care provider tells you that you are at risk for this type of infection.  Your sexual activity has changed  since you were last screened and you are at an increased risk for chlamydia or gonorrhea. Ask your health care provider if you are at risk.  If you are at risk of being infected with HIV, it is recommended that you take a prescription medicine daily to prevent HIV infection. This is called preexposure prophylaxis (PrEP). You are considered at risk if:  You are sexually active and do not regularly use condoms or know the HIV status of your partner(s).  You take drugs by injection.  You are sexually active with a partner who has HIV.  Talk with your health care provider about whether you are at high risk of being infected with HIV. If   you choose to begin PrEP, you should first be tested for HIV. You should then be tested every 3 months for as long as you are taking PrEP.  Osteoporosis is a disease in which the bones lose minerals and strength with aging. This can result in serious bone fractures or breaks. The risk of osteoporosis can be identified using a bone density scan. Women ages 67 years and over and women at risk for fractures or osteoporosis should discuss screening with their health care providers. Ask your health care provider whether you should take a calcium supplement or vitamin D to reduce the rate of osteoporosis.  Menopause can be associated with physical symptoms and risks. Hormone replacement therapy is available to decrease symptoms and risks. You should talk to your health care provider about whether hormone replacement therapy is right for you.  Use sunscreen. Apply sunscreen liberally and repeatedly throughout the day. You should seek shade when your shadow is shorter than you. Protect yourself by wearing long sleeves, pants, a wide-brimmed hat, and sunglasses year round, whenever you are outdoors.  Once a month, do a whole body skin exam, using a mirror to look at the skin on your back. Tell your health care provider of new moles, moles that have irregular borders, moles that  are larger than a pencil eraser, or moles that have changed in shape or color.  Stay current with required vaccines (immunizations).  Influenza vaccine. All adults should be immunized every year.  Tetanus, diphtheria, and acellular pertussis (Td, Tdap) vaccine. Pregnant women should receive 1 dose of Tdap vaccine during each pregnancy. The dose should be obtained regardless of the length of time since the last dose. Immunization is preferred during the 27th-36th week of gestation. An adult who has not previously received Tdap or who does not know her vaccine status should receive 1 dose of Tdap. This initial dose should be followed by tetanus and diphtheria toxoids (Td) booster doses every 10 years. Adults with an unknown or incomplete history of completing a 3-dose immunization series with Td-containing vaccines should begin or complete a primary immunization series including a Tdap dose. Adults should receive a Td booster every 10 years.  Varicella vaccine. An adult without evidence of immunity to varicella should receive 2 doses or a second dose if she has previously received 1 dose. Pregnant females who do not have evidence of immunity should receive the first dose after pregnancy. This first dose should be obtained before leaving the health care facility. The second dose should be obtained 4-8 weeks after the first dose.  Human papillomavirus (HPV) vaccine. Females aged 13-26 years who have not received the vaccine previously should obtain the 3-dose series. The vaccine is not recommended for use in pregnant females. However, pregnancy testing is not needed before receiving a dose. If a female is found to be pregnant after receiving a dose, no treatment is needed. In that case, the remaining doses should be delayed until after the pregnancy. Immunization is recommended for any person with an immunocompromised condition through the age of 61 years if she did not get any or all doses earlier. During the  3-dose series, the second dose should be obtained 4-8 weeks after the first dose. The third dose should be obtained 24 weeks after the first dose and 16 weeks after the second dose.  Zoster vaccine. One dose is recommended for adults aged 30 years or older unless certain conditions are present.  Measles, mumps, and rubella (MMR) vaccine. Adults born  before 1957 generally are considered immune to measles and mumps. Adults born in 1957 or later should have 1 or more doses of MMR vaccine unless there is a contraindication to the vaccine or there is laboratory evidence of immunity to each of the three diseases. A routine second dose of MMR vaccine should be obtained at least 28 days after the first dose for students attending postsecondary schools, health care workers, or international travelers. People who received inactivated measles vaccine or an unknown type of measles vaccine during 1963-1967 should receive 2 doses of MMR vaccine. People who received inactivated mumps vaccine or an unknown type of mumps vaccine before 1979 and are at high risk for mumps infection should consider immunization with 2 doses of MMR vaccine. For females of childbearing age, rubella immunity should be determined. If there is no evidence of immunity, females who are not pregnant should be vaccinated. If there is no evidence of immunity, females who are pregnant should delay immunization until after pregnancy. Unvaccinated health care workers born before 1957 who lack laboratory evidence of measles, mumps, or rubella immunity or laboratory confirmation of disease should consider measles and mumps immunization with 2 doses of MMR vaccine or rubella immunization with 1 dose of MMR vaccine.  Pneumococcal 13-valent conjugate (PCV13) vaccine. When indicated, a person who is uncertain of his immunization history and has no record of immunization should receive the PCV13 vaccine. All adults 65 years of age and older should receive this  vaccine. An adult aged 19 years or older who has certain medical conditions and has not been previously immunized should receive 1 dose of PCV13 vaccine. This PCV13 should be followed with a dose of pneumococcal polysaccharide (PPSV23) vaccine. Adults who are at high risk for pneumococcal disease should obtain the PPSV23 vaccine at least 8 weeks after the dose of PCV13 vaccine. Adults older than 61 years of age who have normal immune system function should obtain the PPSV23 vaccine dose at least 1 year after the dose of PCV13 vaccine.  Pneumococcal polysaccharide (PPSV23) vaccine. When PCV13 is also indicated, PCV13 should be obtained first. All adults aged 65 years and older should be immunized. An adult younger than age 65 years who has certain medical conditions should be immunized. Any person who resides in a nursing home or long-term care facility should be immunized. An adult smoker should be immunized. People with an immunocompromised condition and certain other conditions should receive both PCV13 and PPSV23 vaccines. People with human immunodeficiency virus (HIV) infection should be immunized as soon as possible after diagnosis. Immunization during chemotherapy or radiation therapy should be avoided. Routine use of PPSV23 vaccine is not recommended for American Indians, [place] Natives, or people younger than 65 years unless there are medical conditions that require PPSV23 vaccine. When indicated, people who have unknown immunization and have no record of immunization should receive PPSV23 vaccine. One-time revaccination 5 years after the first dose of PPSV23 is recommended for people aged 19-64 years who have chronic kidney failure, nephrotic syndrome, asplenia, or immunocompromised conditions. People who received 1-2 doses of PPSV23 before age 65 years should receive another dose of PPSV23 vaccine at age 65 years or later if at least 5 years have passed since the previous dose. Doses of PPSV23 are not  needed for people immunized with PPSV23 at or after age 65 years.  Meningococcal vaccine. Adults with asplenia or persistent complement component deficiencies should receive 2 doses of quadrivalent meningococcal conjugate (MenACWY-D) vaccine. The doses should be obtained   at least 2 months apart. Microbiologists working with certain meningococcal bacteria, [place] recruits, people at risk during an outbreak, and people who travel to or live in countries with a high rate of meningitis should be immunized. A first-year college student up through age 34 years who is living in a residence hall should receive a dose if she did not receive a dose on or after her 16th birthday. Adults who have certain high-risk conditions should receive one or more doses of vaccine.  Hepatitis A vaccine. Adults who wish to be protected from this disease, have certain high-risk conditions, work with hepatitis A-infected animals, work in hepatitis A research labs, or travel to or work in countries with a high rate of hepatitis A should be immunized. Adults who were previously unvaccinated and who anticipate close contact with an international adoptee during the first 60 days after arrival in the [place] from a country with a high rate of hepatitis A should be immunized.  Hepatitis B vaccine. Adults who wish to be protected from this disease, have certain high-risk conditions, may be exposed to blood or other infectious body fluids, are household contacts or sex partners of hepatitis B positive people, are clients or workers in certain care facilities, or travel to or work in countries with a high rate of hepatitis B should be immunized.  Haemophilus influenzae type b (Hib) vaccine. A previously unvaccinated person with asplenia or sickle cell disease or having a scheduled splenectomy should receive 1 dose of Hib vaccine. Regardless of previous immunization, a recipient of a hematopoietic stem cell transplant should receive a  3-dose series 6-12 months after her successful transplant. Hib vaccine is not recommended for adults with HIV infection. Preventive Services / Frequency Ages 35 to 4 years  Blood pressure check.** / Every 3-5 years.  Lipid and cholesterol check.** / Every 5 years beginning at age 60.  Clinical breast exam.** / Every 3 years for women in their 71s and 10s.  BRCA-related cancer risk assessment.** / For women who have family members with a BRCA-related cancer (breast, ovarian, tubal, or peritoneal cancers).  Pap test.** / Every 2 years from ages 76 through 26. Every 3 years starting at age 61 through age 76 or 93 with a history of 3 consecutive normal Pap tests.  HPV screening.** / Every 3 years from ages 37 through ages 60 to 51 with a history of 3 consecutive normal Pap tests.  Hepatitis C blood test.** / For any individual with known risks for hepatitis C.  Skin self-exam. / Monthly.  Influenza vaccine. / Every year.  Tetanus, diphtheria, and acellular pertussis (Tdap, Td) vaccine.** / Consult your health care provider. Pregnant women should receive 1 dose of Tdap vaccine during each pregnancy. 1 dose of Td every 10 years.  Varicella vaccine.** / Consult your health care provider. Pregnant females who do not have evidence of immunity should receive the first dose after pregnancy.  HPV vaccine. / 3 doses over 6 months, if 93 and younger. The vaccine is not recommended for use in pregnant females. However, pregnancy testing is not needed before receiving a dose.  Measles, mumps, rubella (MMR) vaccine.** / You need at least 1 dose of MMR if you were born in 1957 or later. You may also need a 2nd dose. For females of childbearing age, rubella immunity should be determined. If there is no evidence of immunity, females who are not pregnant should be vaccinated. If there is no evidence of immunity, females who are  pregnant should delay immunization until after pregnancy.  Pneumococcal  13-valent conjugate (PCV13) vaccine.** / Consult your health care provider.  Pneumococcal polysaccharide (PPSV23) vaccine.** / 1 to 2 doses if you smoke cigarettes or if you have certain conditions.  Meningococcal vaccine.** / 1 dose if you are age 68 to 8 years and a Market researcher living in a residence hall, or have one of several medical conditions, you need to get vaccinated against meningococcal disease. You may also need additional booster doses.  Hepatitis A vaccine.** / Consult your health care provider.  Hepatitis B vaccine.** / Consult your health care provider.  Haemophilus influenzae type b (Hib) vaccine.** / Consult your health care provider. Ages 7 to 53 years  Blood pressure check.** / Every year.  Lipid and cholesterol check.** / Every 5 years beginning at age 25 years.  Lung cancer screening. / Every year if you are aged 11-80 years and have a 30-pack-year history of smoking and currently smoke or have quit within the past 15 years. Yearly screening is stopped once you have quit smoking for at least 15 years or develop a health problem that would prevent you from having lung cancer treatment.  Clinical breast exam.** / Every year after age 48 years.  BRCA-related cancer risk assessment.** / For women who have family members with a BRCA-related cancer (breast, ovarian, tubal, or peritoneal cancers).  Mammogram.** / Every year beginning at age 41 years and continuing for as long as you are in good health. Consult with your health care provider.  Pap test.** / Every 3 years starting at age 65 years through age 37 or 70 years with a history of 3 consecutive normal Pap tests.  HPV screening.** / Every 3 years from ages 72 years through ages 60 to 40 years with a history of 3 consecutive normal Pap tests.  Fecal occult blood test (FOBT) of stool. / Every year beginning at age 21 years and continuing until age 5 years. You may not need to do this test if you get  a colonoscopy every 10 years.  Flexible sigmoidoscopy or colonoscopy.** / Every 5 years for a flexible sigmoidoscopy or every 10 years for a colonoscopy beginning at age 35 years and continuing until age 48 years.  Hepatitis C blood test.** / For all people born from [birth year] through 1965 and any individual with known risks for hepatitis C.  Skin self-exam. / Monthly.  Influenza vaccine. / Every year.  Tetanus, diphtheria, and acellular pertussis (Tdap/Td) vaccine.** / Consult your health care provider. Pregnant women should receive 1 dose of Tdap vaccine during each pregnancy. 1 dose of Td every 10 years.  Varicella vaccine.** / Consult your health care provider. Pregnant females who do not have evidence of immunity should receive the first dose after pregnancy.  Zoster vaccine.** / 1 dose for adults aged 30 years or older.  Measles, mumps, rubella (MMR) vaccine.** / You need at least 1 dose of MMR if you were born in 1957 or later. You may also need a second dose. For females of childbearing age, rubella immunity should be determined. If there is no evidence of immunity, females who are not pregnant should be vaccinated. If there is no evidence of immunity, females who are pregnant should delay immunization until after pregnancy.  Pneumococcal 13-valent conjugate (PCV13) vaccine.** / Consult your health care provider.  Pneumococcal polysaccharide (PPSV23) vaccine.** / 1 to 2 doses if you smoke cigarettes or if you have certain conditions.  Meningococcal vaccine.** /  Consult your health care provider.  Hepatitis A vaccine.** / Consult your health care provider.  Hepatitis B vaccine.** / Consult your health care provider.  Haemophilus influenzae type b (Hib) vaccine.** / Consult your health care provider. Ages 64 years and over  Blood pressure check.** / Every year.  Lipid and cholesterol check.** / Every 5 years beginning at age 23 years.  Lung cancer screening. / Every year if you  are aged 16-80 years and have a 30-pack-year history of smoking and currently smoke or have quit within the past 15 years. Yearly screening is stopped once you have quit smoking for at least 15 years or develop a health problem that would prevent you from having lung cancer treatment.  Clinical breast exam.** / Every year after age 74 years.  BRCA-related cancer risk assessment.** / For women who have family members with a BRCA-related cancer (breast, ovarian, tubal, or peritoneal cancers).  Mammogram.** / Every year beginning at age 44 years and continuing for as long as you are in good health. Consult with your health care provider.  Pap test.** / Every 3 years starting at age 58 years through age 22 or 39 years with 3 consecutive normal Pap tests. Testing can be stopped between 65 and 70 years with 3 consecutive normal Pap tests and no abnormal Pap or HPV tests in the past 10 years.  HPV screening.** / Every 3 years from ages 64 years through ages 70 or 61 years with a history of 3 consecutive normal Pap tests. Testing can be stopped between 65 and 70 years with 3 consecutive normal Pap tests and no abnormal Pap or HPV tests in the past 10 years.  Fecal occult blood test (FOBT) of stool. / Every year beginning at age 40 years and continuing until age 27 years. You may not need to do this test if you get a colonoscopy every 10 years.  Flexible sigmoidoscopy or colonoscopy.** / Every 5 years for a flexible sigmoidoscopy or every 10 years for a colonoscopy beginning at age 7 years and continuing until age 32 years.  Hepatitis C blood test.** / For all people born from [birth year] through 1966 and any individual with known risks for hepatitis C.  Osteoporosis screening.** / A one-time screening for women ages 30 years and over and women at risk for fractures or osteoporosis.  Skin self-exam. / Monthly.  Influenza vaccine. / Every year.  Tetanus, diphtheria, and acellular pertussis (Tdap/Td)  vaccine.** / 1 dose of Td every 10 years.  Varicella vaccine.** / Consult your health care provider.  Zoster vaccine.** / 1 dose for adults aged 35 years or older.  Pneumococcal 13-valent conjugate (PCV13) vaccine.** / Consult your health care provider.  Pneumococcal polysaccharide (PPSV23) vaccine.** / 1 dose for all adults aged 46 years and older.  Meningococcal vaccine.** / Consult your health care provider.  Hepatitis A vaccine.** / Consult your health care provider.  Hepatitis B vaccine.** / Consult your health care provider.  Haemophilus influenzae type b (Hib) vaccine.** / Consult your health care provider. ** Family history and personal history of risk and conditions may change your health care provider's recommendations.   This information is not intended to replace advice given to you by your health care provider. Make sure you discuss any questions you have with your health care provider.   Document Released: 12/22/2001 Document Revised: 11/16/2014 Document Reviewed: 03/23/2011 Elsevier Interactive Patient Education [employer].

## 2016-03-23 NOTE — Progress Notes (Signed)
Subjective:  Patient ID: Latasha Wood, female    DOB: 04-Apr-1956  Age: 60 y.o. MRN: [id number]  CC: Annual Exam; Anemia; Osteoarthritis; and Diabetes   HPI Latasha Wood presents for a CPX -  She complains of diffuse joint pain and needs a refill on tramadol.  She has a multitude of symptoms and needs disability paperwork completed. She also needs a note to excuse her from jury duty.  She has had multiple large bowel surgeries over the last 6 or 8 months for ulcerative colitis with complications. She is being followed for anemia and has a normochromic, normocytic anemia that was last checked about 3 months ago. She notices an occasional blood from her rectum but no other sources of blood loss. She is taking several multivitamins. She complains of fatigue but denies shortness of breath, numbness, tingling, or edema. She has diarrhea in her ileostomy bag but never sees blood in it. She is taking Lomotil and Imodium to control the diarrhea.    Outpatient Prescriptions Prior to Visit  Medication Sig Dispense Refill  . acetaminophen (TYLENOL) 650 MG CR tablet Take 650 mg by mouth every 8 (eight) hours as needed for pain.    . diphenoxylate-atropine (LOMOTIL) 2.5-0.025 MG tablet Take 1 tablet by mouth 4 (four) times daily. 60 tablet 3  . famotidine (PEPCID) 20 MG tablet Take 1 tablet (20 mg total) by mouth 2 (two) times daily. 60 tablet 3  . hydrocortisone cream 1 % Apply topically as needed for itching. (Patient taking differently: Apply 1 application topically 2 (two) times daily as needed for itching. ) 30 g 0  . loperamide (IMODIUM) 2 MG capsule Take 1 capsule (2 mg total) by mouth 3 (three) times daily. 60 capsule 3  . ALPRAZolam (XANAX) 0.25 MG tablet Take 1 tablet (0.25 mg total) by mouth 2 (two) times daily as needed for anxiety. 60 tablet 3  . ondansetron (ZOFRAN-ODT) 4 MG disintegrating tablet Take 1 tablet (4 mg total) by mouth every 6 (six) hours as needed for nausea. 20  tablet 3  . oxyCODONE (ROXICODONE) 5 MG/5ML solution Take 5-10 mLs (5-10 mg total) by mouth every 4 (four) hours as needed for moderate pain. 300 mL 0  . cephALEXin (KEFLEX) 500 MG capsule Take 2 capsules (1,000 mg total) by mouth 2 (two) times daily. (Patient not taking: Reported on 12/11/2015) 40 capsule 0   No facility-administered medications prior to visit.    ROS Review of Systems  Constitutional: Positive for fatigue. Negative for fever, chills, diaphoresis and appetite change.  HENT: Negative.  Negative for sore throat and trouble swallowing.   Eyes: Negative.   Respiratory: Negative.  Negative for cough, choking, chest tightness, shortness of breath and stridor.   Cardiovascular: Negative.  Negative for chest pain, palpitations and leg swelling.  Gastrointestinal: Positive for diarrhea and anal bleeding. Negative for nausea and vomiting.  Endocrine: Negative.   Genitourinary: Negative.  Negative for dysuria, urgency, frequency, hematuria, flank pain and decreased urine volume.  Musculoskeletal: Positive for arthralgias. Negative for myalgias, back pain and joint swelling.  Skin: Negative.  Negative for color change and rash.  Allergic/Immunologic: Negative.   Neurological: Positive for dizziness. Negative for syncope, speech difficulty, weakness, light-headedness, numbness and headaches.  Hematological: Negative.  Negative for adenopathy. Does not bruise/bleed easily.  Psychiatric/Behavioral: Positive for sleep disturbance. Negative for suicidal ideas, behavioral problems, self-injury and dysphoric mood. The patient is nervous/anxious.     Objective:  BP 122/84 mmHg  Pulse 89  Temp(Src) 98.8 F (37.1 C) (Oral)  Resp 16  Ht 5' 3"  (1.6 m)  Wt 174 lb (78.926 kg)  BMI 30.83 kg/m2  SpO2 97%  BP Readings from Last 3 Encounters:  03/23/16 122/84  12/13/15 121/64  11/01/15 105/69    Wt Readings from Last 3 Encounters:  03/23/16 174 lb (78.926 kg)  12/11/15 179 lb (81.194  kg)  10/07/15 196 lb 3.4 oz (89 kg)    Physical Exam  Constitutional: She is oriented to person, place, and time. She appears well-developed and well-nourished. No distress.  HENT:  Head: Normocephalic and atraumatic.  Mouth/Throat: Oropharynx is clear and moist. No oropharyngeal exudate.  Eyes: Conjunctivae are normal. Right eye exhibits no discharge. Left eye exhibits no discharge. No scleral icterus.  Neck: Normal range of motion. Neck supple. No JVD present. No tracheal deviation present. No thyromegaly present.  Cardiovascular: Normal rate, regular rhythm, normal heart sounds and intact distal pulses.  Exam reveals no gallop and no friction rub.   No murmur heard. Pulmonary/Chest: Effort normal and breath sounds normal. No stridor. No respiratory distress. She has no wheezes. She has no rales. She exhibits no tenderness.  Abdominal: Soft. Bowel sounds are normal. She exhibits no distension and no mass. There is no tenderness. There is no rebound and no guarding.    Musculoskeletal: Normal range of motion. She exhibits no edema or tenderness.  Lymphadenopathy:    She has no cervical adenopathy.  Neurological: She is oriented to person, place, and time.  Skin: Skin is warm and dry. No rash noted. She is not diaphoretic. No erythema. No pallor.  Psychiatric: She has a normal mood and affect. Her behavior is normal. Judgment and thought content normal.  Vitals reviewed.   Lab Results  Component Value Date   WBC 7.2 03/23/2016   HGB 11.9* 03/23/2016   HCT 35.9* 03/23/2016   PLT 326.0 03/23/2016   GLUCOSE 99 03/23/2016   CHOL 198 03/23/2016   TRIG 275.0* 03/23/2016   HDL 49.20 03/23/2016   LDLDIRECT 91.0 03/23/2016   LDLCALC 147* 05/28/2015   ALT 20 03/23/2016   AST 25 03/23/2016   NA 136 03/23/2016   K 4.0 03/23/2016   CL 100 03/23/2016   CREATININE 1.05 03/23/2016   BUN 18 03/23/2016   CO2 29 03/23/2016   TSH 1.73 03/23/2016   INR 1.16 12/11/2015   HGBA1C 6.4  03/23/2016   MICROALBUR 1.1 03/23/2016    Dg Chest 2 View  12/11/2015  CLINICAL DATA:  Shortness of breath and hypotension EXAM: CHEST  2 VIEW COMPARISON:  09/01/2015 FINDINGS: Minimal linear atelectasis. There is no edema, consolidation, effusion, or pneumothorax. Normal heart size and aortic contours. IMPRESSION: No edema or pneumonia. Electronically Signed   By: Monte Fantasia M.D.   On: 12/11/2015 05:09   Dg Abd 1 View  12/11/2015  CLINICAL DATA:  Mid abdominal discomfort, question sepsis, syncope, history ulcerative colitis, hypertension, and type II diabetes mellitus EXAM: ABDOMEN - 1 VIEW COMPARISON:  09/02/2015 FINDINGS: Nonspecific bowel gas pattern. Single air-filled upper normal caliber small bowel loop LEFT upper quadrant, nonspecific. Remaining bowel loops unremarkable. No bowel dilatation or bowel wall thickening. Osseous structures unremarkable. No urinary tract calcification. IMPRESSION: Nonspecific bowel gas pattern. Electronically Signed   By: Lavonia Dana M.D.   On: 12/11/2015 08:15   US Renal  12/11/2015  CLINICAL DATA:  Renal failure. EXAM: RENAL / URINARY TRACT ULTRASOUND COMPLETE COMPARISON:  None. FINDINGS: Right Kidney: Length: 11.2 cm. Echogenicity within normal  limits. No mass or hydronephrosis visualized. There is a 1.2 cm simple appearing cyst within the midpole of the right kidney. Left Kidney: Length: 11.5 cm. Echogenicity within normal limits. No mass or hydronephrosis visualized. Bladder: Appears normal for degree of bladder distention. IMPRESSION: 1.2 cm simple appearing cyst in the midpole of the right kidney, otherwise normal renal ultrasound. Electronically Signed   By: Fidela Salisbury M.D.   On: 12/11/2015 07:50    Assessment & Plan:   Avenly was seen today for annual exam, anemia, osteoarthritis and diabetes.  Diagnoses and all orders for this visit:  Type 2 diabetes mellitus with complication, without long-term current use of insulin ([place])- With an A1c  of 6.4% her blood sugars are well-controlled on no medications, she was referred for an annual exam. -     Hemoglobin A1c; Future -     Microalbumin / creatinine urine ratio; Future -     Ambulatory referral to Ophthalmology  Pernicious anemia- her hemoglobin and hematocrit have improved, all of her vitamin levels are normal, she most likely has the anemia of chronic disease and I will continue to follow. -     CBC with Differential/Platelet; Future -     IBC panel; Future -     Ferritin; Future -     Folate; Future -     Vitamin B12; Future  Routine general medical examination at a health care facility- exam completed, labs ordered and reviewed, vaccines were reviewed and updated, she was referred for mammogram, her colonoscopy and Pap smear up-to-date. Patient education material was given. -     Lipid panel; Future -     TSH; Future -     Urinalysis, Routine w reflex microscopic (not at [facility]); Future -     Comprehensive metabolic panel; Future  Arthropathy in ulcerative colitis, other complication (HCC) -     traMADol (ULTRAM) 50 MG tablet; Take 1 tablet (50 mg total) by mouth every 8 (eight) hours.  Anxiety -     ALPRAZolam (XANAX) 0.25 MG tablet; Take 1 tablet (0.25 mg total) by mouth 2 (two) times daily as needed for anxiety.  Visit for screening mammogram -     MM DIGITAL SCREENING BILATERAL; Future  Need for 23-polyvalent pneumococcal polysaccharide vaccine -     Pneumococcal polysaccharide vaccine 23-valent greater than or equal to 2yo subcutaneous/IM   I have discontinued Ms. Alejandro's oxyCODONE, ondansetron, and cephALEXin. I have also changed her traMADol. Additionally, I am having her maintain her acetaminophen, famotidine, hydrocortisone cream, diphenoxylate-atropine, loperamide, olmesartan-hydrochlorothiazide, nystatin, and ALPRAZolam.  Meds ordered this encounter  Medications  . olmesartan-hydrochlorothiazide (BENICAR HCT) 40-12.5 MG tablet    Sig:   . NYSTATIN  powder    Sig: APPLY POWDER TO AFFECTED AREA 3 TIMES A DAY    Refill:  3  . DISCONTD: traMADol (ULTRAM) 50 MG tablet    Sig: Take 50 mg by mouth every 8 (eight) hours.  . traMADol (ULTRAM) 50 MG tablet    Sig: Take 1 tablet (50 mg total) by mouth every 8 (eight) hours.    Dispense:  65 tablet    Refill:  5  . ALPRAZolam (XANAX) 0.25 MG tablet    Sig: Take 1 tablet (0.25 mg total) by mouth 2 (two) times daily as needed for anxiety.    Dispense:  60 tablet    Refill:  3     Follow-up: Return in about 4 months (around 07/24/2016).  Scarlette Calico, MD

## 2016-03-25 ENCOUNTER — Telehealth: Payer: Self-pay | Admitting: Internal Medicine

## 2016-03-25 DIAGNOSIS — R21 Rash and other nonspecific skin eruption: Secondary | ICD-10-CM

## 2016-03-25 NOTE — Telephone Encounter (Signed)
appt made

## 2016-03-25 NOTE — Telephone Encounter (Signed)
Pt is requesting a referral to [facility] for a severe case of dermatitis that is under her stoma devise. The people who take care of that advised her to call us to get a referral in place to see Dr. Amy Martinique or Dr. Rolm Bookbinder. They told her she should be seen asap. Please advise

## 2016-03-25 NOTE — Telephone Encounter (Signed)
Referral done

## 2016-03-27 ENCOUNTER — Telehealth: Payer: Self-pay | Admitting: Gastroenterology

## 2016-03-27 NOTE — Telephone Encounter (Signed)
Called Opal Sidles and told her we have not seen patient since 07/2015.

## 2016-04-16 ENCOUNTER — Ambulatory Visit (INDEPENDENT_AMBULATORY_CARE_PROVIDER_SITE_OTHER): Payer: 59 | Admitting: Gastroenterology

## 2016-04-16 ENCOUNTER — Encounter: Payer: Self-pay | Admitting: Gastroenterology

## 2016-04-16 ENCOUNTER — Other Ambulatory Visit: Payer: Self-pay | Admitting: Gastroenterology

## 2016-04-16 VITALS — BP 126/80 | HR 78 | Ht 63.0 in | Wt 177.8 lb

## 2016-04-16 DIAGNOSIS — R112 Nausea with vomiting, unspecified: Secondary | ICD-10-CM

## 2016-04-16 DIAGNOSIS — Z8719 Personal history of other diseases of the digestive system: Secondary | ICD-10-CM | POA: Diagnosis not present

## 2016-04-16 DIAGNOSIS — Z932 Ileostomy status: Secondary | ICD-10-CM

## 2016-04-16 NOTE — Progress Notes (Signed)
HPI :  60 y/o female here for follow up, well known to Dr. Olevia Perches. She had a history of predominantly left sided UC although with some pancolonic activity on colonoscopy when last done in May 2016.   She was diagnosed with UC at age 60.  She had been on Lialda, 6MP, and Humira in the past but stopped Humira when she was found to have antibodies to it and ultimately lost response. She had a prior history of multiple courses of steroids.   After the last visit with me in September I had recommended Remicade for her colitis, however she elected to have a colectomy done as she wanted more definitive therapy and was concerned about long term potential risks of biologic therapy. She had colectomy done in December but was readmitted following necrosis of the ileostomy. She was taken to the OR and necrosis was noted down to the level of the fascia. She required a laparotomy to mobilize a portion of bowel up to her midline. She remained in the hospital on TPN. On POD 8 she developed a low output fistula from the abdominal wall. She was made NPO and her TPN was cycled and she was discharged. She was readmitted in February for dehydration and high ostomy output. She has not been admitted since February. She reports over time she has a developed a severe dermatitis around the ostomy, due to ongoing leakage at the ostomy, which is causing irritation of the area.  She has been going to an ostomy clinic in [place] for which they gave her topical flonase applied to the area. At one point in time she had 2 bags - one for ostomy, one for fistula, and wound vac. Over time now just using one bag for the ostomy site. She is seeing the ostomy clinic as needed at this time. Output from her ostomy is unknown, she doesn't measure it, she thinks normal. She continues to not feel well overall.  She has some nausea and vomiting occasionally. She also has occasional abdominal cramps. She is having sporadic vomiting, perhaps once  per week or so. She is having abdominal cramps regularly, once or twice per week, which can last several hours. She can feel it in the mid abdomen. She does not think she has blood in output. She has seen dermatologist for her dermatitis, and on a steroid spray which has not yet tried. The fistula has healed, no further output. She has seen orthopedics for ongoing joint pains. She has plans for another bowel surgery to move the ostomy to allow reduced leakage and improve dermatitis, and allow easier access to the ostomy. Husband has been changing all of her ostomy bags since she has a hard time visualizing the site. She's had an EGD in 1995 which appeared normal.    Past Medical History  Diagnosis Date  . Hypertension   . Colitis, ulcerative ([place])   . Hyperlipidemia   . Colon polyps   . Blood transfusion   . Anemia   . Shortness of breath dyspnea     talking or walking   . Type 2 diabetes mellitus with complication, without long-term current use of insulin ([place]) 09/05/2015    currently on no medications   . Numbness and tingling     hands and feet bilat   . Ileostomy present [facility])      Past Surgical History  Procedure Laterality Date  . Gravida 6 para 2      all SAB  . Hyadiform  mole    . Dilation and curettage of uterus      most likely after miscarriage  . Knee arthroscopy w/ meniscal repair      right knee ([place])  . Fibrocystic breast disease      q 6 month mammogram  . Abdominal hysterectomy  2000  . Small bowel obstruction    . Robotic assisted laparoscopic lysis of adhesion N/A 09/25/2015    Procedure: XI ROBOTIC ASSISTED LAPAROSCOPIC LYSIS OF ADHESION;  Surgeon: Leighton Ruff, MD;  Location: [facility];  Service: General;  Laterality: N/A;  90 minutes  . Laparoscopic small bowel resection N/A 09/25/2015    Procedure: LAPAROSCOPIC BOWEL RESECTION TIMES TWO;  Surgeon: Leighton Ruff, MD;  Location: [facility];  Service: General;  Laterality: N/A;  . Ileo loop diversion N/A  09/25/2015    Procedure: ILEO LOOP COLOSTOMY;  Surgeon: Leighton Ruff, MD;  Location: [facility];  Service: General;  Laterality: N/A;  . Ileostomy closure N/A 10/08/2015    Procedure: ILEOSTOMY REVISION;  Surgeon: Leighton Ruff, MD;  Location: [facility];  Service: General;  Laterality: N/A;  . Abdominal surgery    . Colon surgery      colostomy   Family History  Problem Relation Age of Onset  . Diabetes Mother   . Breast cancer Mother 48  . Diabetes Father   . Heart disease Father   . Diabetes Sister   . Colon cancer Paternal Aunt   . Diabetes Brother   . Breast cancer Sister 43   Social History  Substance Use Topics  . Smoking status: Never Smoker   . Smokeless tobacco: Never Used  . Alcohol Use: 1.0 oz/week    2 Standard drinks or equivalent per week     Comment: occ   Current Outpatient Prescriptions  Medication Sig Dispense Refill  . acetaminophen (TYLENOL) 650 MG CR tablet Take 650 mg by mouth every 8 (eight) hours as needed for pain.    Marland Kitchen ALPRAZolam (XANAX) 0.25 MG tablet Take 1 tablet (0.25 mg total) by mouth 2 (two) times daily as needed for anxiety. 60 tablet 3  . diphenoxylate-atropine (LOMOTIL) 2.5-0.025 MG tablet Take 1 tablet by mouth 4 (four) times daily. 60 tablet 3  . famotidine (PEPCID) 20 MG tablet Take 1 tablet (20 mg total) by mouth 2 (two) times daily. 60 tablet 3  . hydrocortisone cream 1 % Apply topically as needed for itching. (Patient taking differently: Apply 1 application topically 2 (two) times daily as needed for itching. ) 30 g 0  . loperamide (IMODIUM) 2 MG capsule Take 1 capsule (2 mg total) by mouth 3 (three) times daily. 60 capsule 3  . NYSTATIN powder APPLY POWDER TO AFFECTED AREA 3 TIMES A DAY  3  . olmesartan-hydrochlorothiazide (BENICAR HCT) 40-12.5 MG tablet     . traMADol (ULTRAM) 50 MG tablet Take 1 tablet (50 mg total) by mouth every 8 (eight) hours. 65 tablet 5   No current facility-administered medications for this visit.   No Known  Allergies   Review of Systems: All systems reviewed and negative except where noted in HPI.    Lab Results  Component Value Date   WBC 7.2 03/23/2016   HGB 11.9* 03/23/2016   HCT 35.9* 03/23/2016   MCV 83.3 03/23/2016   PLT 326.0 03/23/2016   Lab Results  Component Value Date   CREATININE 1.05 03/23/2016   BUN 18 03/23/2016   NA 136 03/23/2016   K 4.0 03/23/2016   CL 100  03/23/2016   CO2 29 03/23/2016    Lab Results  Component Value Date   ALT 20 03/23/2016   AST 25 03/23/2016   ALKPHOS 82 03/23/2016   BILITOT 0.3 03/23/2016    Lab Results  Component Value Date   IRON 54 03/23/2016   TIBC 237* 10/24/2015   FERRITIN 93.8 03/23/2016     Physical Exam: BP 126/80 mmHg  Pulse 78  Ht 5' 3"  (1.6 m)  Wt 177 lb 12.8 oz (80.65 kg)  BMI 31.50 kg/m2  SpO2 98% Constitutional: Pleasant, female in no acute distress. HEENT: Normocephalic and atraumatic. Conjunctivae are normal. No scleral icterus. Neck supple.  Cardiovascular: Normal rate, regular rhythm.  Pulmonary/chest: Effort normal and breath sounds normal. No wheezing, rales or rhonchi. Abdominal: Soft, nondistended, nontender. Ileostomy in midline abdomen with dermatitis surrouding it. Fisulta tract in R mid abdomen is closed. Bowel sounds active throughout. There are no masses palpable. Brown stool in ostomy Extremities: no edema Lymphadenopathy: No cervical adenopathy noted. Neurological: Alert and oriented to person place and time. Skin: Skin is warm and dry. No rashes noted. Psychiatric: Normal mood and affect. Behavior is normal.   ASSESSMENT AND PLAN: 60 y/o female with history of UC who failed multiple medical regimens and opted to have a Colectomy this past winter. She has had post-operative complications as outlined below to include necrosis of the ostomy, development of abdominal wall fistula, and ongoing leakage from the ostomy associated with dermatitis. She reportedly has had a difficult surgery due  to significant adhesive disease. Over time, her bowel function has adapted and stool frequency has decreased, no further issues with dehydration. She has some ongoing intermittent nausea / vomiting, and abdominal cramps, however her labs are significantly improved with no anemia and normal iron stores. Her fistula has closed, not sure if this developed in relation to post-operative issues and hopefully not related to the presence of any underlying Crohns.  I suspect she is having some functional bowel changes after her surgery, and our hope is that she has had a surgical cure of her IBD. That being said, there is a low risk after colectomy for the development of small bowel disease / Crohns. I offered her a fecal calprotectin initially in this light to ensure normal. We discussed possible ileoscopy at some point in time but given she is improving and her issues with the ostomy, she wants to hold off for now on ileoscopy, and if calprotectin is normal we may not need to do this pending she continues to improve. I otherwise offered her an EGD after discussion of risks / benefits to evaluate her nausea / vomiting, ensure no upper tract Crohns. She agreed with the plan, further recommendations pending this result. > 30 minutes spent with patient and husband.  [place] Cellar, MD [facility] Pager [phone]

## 2016-04-16 NOTE — Patient Instructions (Signed)
Your physician has requested that you go to the basement for the following lab work before leaving today:  Fecal calprotectin  You have been scheduled for an endoscopy. Please follow written instructions given to you at your visit today. If you use inhalers (even only as needed), please bring them with you on the day of your procedure. Your physician has requested that you go to www.[email or web address].com and enter the access code given to you at your visit today. This web site gives a general overview about your procedure. However, you should still follow specific instructions given to you by our office regarding your preparation for the procedure.

## 2016-04-20 ENCOUNTER — Other Ambulatory Visit: Payer: 59

## 2016-04-20 DIAGNOSIS — R112 Nausea with vomiting, unspecified: Secondary | ICD-10-CM

## 2016-04-24 LAB — CALPROTECTIN, FECAL

## 2016-05-06 ENCOUNTER — Ambulatory Visit: Payer: 59

## 2016-05-14 ENCOUNTER — Encounter: Payer: 59 | Admitting: Gastroenterology

## 2016-05-15 ENCOUNTER — Encounter: Payer: Self-pay | Admitting: Gastroenterology

## 2016-05-26 ENCOUNTER — Encounter: Payer: Self-pay | Admitting: Gastroenterology

## 2016-05-26 ENCOUNTER — Ambulatory Visit (AMBULATORY_SURGERY_CENTER): Payer: 59 | Admitting: Gastroenterology

## 2016-05-26 VITALS — BP 122/81 | HR 70 | Temp 97.1°F | Resp 17 | Ht 63.0 in | Wt 177.0 lb

## 2016-05-26 DIAGNOSIS — K317 Polyp of stomach and duodenum: Secondary | ICD-10-CM

## 2016-05-26 DIAGNOSIS — G43A0 Cyclical vomiting, not intractable: Secondary | ICD-10-CM

## 2016-05-26 MED ORDER — SODIUM CHLORIDE 0.9 % IV SOLN: 500.0000 mL | INTRAVENOUS | Status: DC

## 2016-05-26 NOTE — Patient Instructions (Signed)
YOU HAD AN ENDOSCOPIC PROCEDURE TODAY AT [facility]:   Refer to the procedure report that was given to you for any specific questions about what was found during the examination.  If the procedure report does not answer your questions, please call your gastroenterologist to clarify.  If you requested that your care partner not be given the details of your procedure findings, then the procedure report has been included in a sealed envelope for you to review at your convenience later.  YOU SHOULD EXPECT: Some feelings of bloating in the abdomen. Passage of more gas than usual.  Walking can help get rid of the air that was put into your GI tract during the procedure and reduce the bloating. If you had a lower endoscopy (such as a colonoscopy or flexible sigmoidoscopy) you may notice spotting of blood in your stool or on the toilet paper. If you underwent a bowel prep for your procedure, you may not have a normal bowel movement for a few days.  Please Note:  You might notice some irritation and congestion in your nose or some drainage.  This is from the oxygen used during your procedure.  There is no need for concern and it should clear up in a day or so.  SYMPTOMS TO REPORT IMMEDIATELY:    Following upper endoscopy (EGD)  Vomiting of blood or coffee ground material  New chest pain or pain under the shoulder blades  Painful or persistently difficult swallowing  New shortness of breath  Fever of 100F or higher  Black, tarry-looking stools  For urgent or emergent issues, a gastroenterologist can be reached at any hour by calling [phone].   DIET: Your first meal following the procedure should be a small meal and then it is ok to progress to your normal diet. Heavy or fried foods are harder to digest and may make you feel nauseous or bloated.  Likewise, meals heavy in dairy and vegetables can increase bloating.  Drink plenty of fluids but you should avoid alcoholic beverages  for 24 hours.  ACTIVITY:  You should plan to take it easy for the rest of today and you should NOT DRIVE or use heavy machinery until tomorrow (because of the sedation medicines used during the test).    FOLLOW UP: Our staff will call the number listed on your records the next business day following your procedure to check on you and address any questions or concerns that you may have regarding the information given to you following your procedure. If we do not reach you, we will leave a message.  However, if you are feeling well and you are not experiencing any problems, there is no need to return our call.  We will assume that you have returned to your regular daily activities without incident.  If any biopsies were taken you will be contacted by phone or by letter within the next 1-3 weeks.  Please call us at [phone] if you have not heard about the biopsies in 3 weeks.    SIGNATURES/CONFIDENTIALITY: You and/or your care partner have signed paperwork which will be entered into your electronic medical record.  These signatures attest to the fact that that the information above on your After Visit Summary has been reviewed and is understood.  Full responsibility of the confidentiality of this discharge information lies with you and/or your care-partner.    You may resume your current medications today. Await biopsy results. Please call if any questions or concerns.

## 2016-05-26 NOTE — Progress Notes (Signed)
When pt first arrived in the recovery room, she c/o of a sore throat, mild nausea and was spitting up clear mucous.  I advised her most pt did not have a sore throat after egd unless their esophagus was stretched and her was not.  I advised pt and her husband that we did instill flatus in her stomach.  I encouraged her to pass flatus.  Pt did belch some and she express that she felt better.  No further complaints noted in the recovery room.  maw

## 2016-05-26 NOTE — Progress Notes (Signed)
Called to room to assist during endoscopic procedure.  Patient ID and intended procedure confirmed with present staff. Received instructions for my participation in the procedure from the performing physician.  

## 2016-05-26 NOTE — Progress Notes (Signed)
Stable to RR 

## 2016-05-26 NOTE — Op Note (Signed)
[place] Patient Name: Trenton Verne Procedure Date: 05/26/2016 9:35 AM MRN: [id number] Endoscopist: Remo Lipps P. Havery Moros , MD Age: 60 Referring MD:  Date of Birth: December 01, 1955 Gender: Female Account #: [id number] Procedure:                Upper GI endoscopy Indications:              Persistent nausea / vomiting of unknown cause,                            history of colitis s/p colectomy, ensure no upper                            tract Crohns Medicines:                Monitored Anesthesia Care Procedure:                Pre-Anesthesia Assessment:                           - Prior to the procedure, a History and Physical                            was performed, and patient medications and                            allergies were reviewed. The patient's tolerance of                            previous anesthesia was also reviewed. The risks                            and benefits of the procedure and the sedation                            options and risks were discussed with the patient.                            All questions were answered, and informed consent                            was obtained. Prior Anticoagulants: The patient has                            taken no previous anticoagulant or antiplatelet                            agents. ASA Grade Assessment: II - A patient with                            mild systemic disease. After reviewing the risks                            and benefits, the patient was deemed in  satisfactory condition to undergo the procedure.                           After obtaining informed consent, the endoscope was                            passed under direct vision. Throughout the                            procedure, the patient's blood pressure, pulse, and                            oxygen saturations were monitored continuously. The                            Model GIF-[id number]) scope  was introduced                            through the mouth, and advanced to the second part                            of duodenum. The upper GI endoscopy was                            accomplished without difficulty. The patient                            tolerated the procedure well. Scope In: Scope Out: Findings:                 Esophagogastric landmarks were identified: the                            Z-line was found at 38 cm, the gastroesophageal                            junction was found at 38 cm and the upper extent of                            the gastric folds was found at 38 cm from the                            incisors.                           Whitish appearing plaque-like mucosa was found                            localized at the gastroesophageal junction along                            roughly 50% of the circumference (9 o'clock                            position to 3 o'clock  position), < 1cm in length,                            without any focal nodularity. Biopsies were taken                            with a cold forceps for histology.                           The exam of the esophagus was otherwise normal.                           The entire examined stomach was normal. Biopsies                            were taken with a cold forceps for Helicobacter                            pylori testing.                           A few diminutive nodules were found in the duodenal                            bulb suspicious for benign ectopic gastric mucosa.                            Biopsies were taken with a cold forceps for                            histology.                           The exam of the duodenum was otherwise normal.                           Biopsies for histology were taken with a cold                            forceps in the duodenal bulb and in the second                            portion of the duodenum for evaluation of  enteritis Complications:            No immediate complications. Estimated blood loss:                            Minimal. Estimated Blood Loss:     Estimated blood loss was minimal. Impression:               - Esophagogastric landmarks identified.                           - Benign appearing plaque at the gastroesophageal  junction. Biopsied.                           - Normal stomach. Biopsied.                           - Nodules found in the duodenum most consistent                            with benign ectopic gastric mucosa. Biopsied.                           - Biopsies were taken with a cold forceps for                            evaluation of enteritis                           . Recommendation:           - Patient has a contact number available for                            emergencies. The signs and symptoms of potential                            delayed complications were discussed with the                            patient. Return to normal activities tomorrow.                            Written discharge instructions were provided to the                            patient.                           - Resume previous diet.                           - Continue present medications.                           - Await pathology results. Remo Lipps P. Armbruster, MD 05/26/2016 9:56:33 AM This report has been signed electronically.

## 2016-05-27 ENCOUNTER — Telehealth: Payer: Self-pay | Admitting: *Deleted

## 2016-05-27 NOTE — Telephone Encounter (Signed)
  Follow up Call-  Call back number 05/26/2016 04/01/2015  Post procedure Call Back phone  # [phone]  Permission to leave phone message Yes Yes     Patient questions:  Do you have a fever, pain , or abdominal swelling? No. Pain Score  0 *  Have you tolerated food without any problems? Yes.    Have you been able to return to your normal activities? Yes.    Do you have any questions about your discharge instructions: Diet   No. Medications  No. Follow up visit  No.  Do you have questions or concerns about your Care? No.  Actions: * If pain score is 4 or above: No action needed, pain <4.

## 2016-06-02 ENCOUNTER — Ambulatory Visit
Admission: RE | Admit: 2016-06-02 | Discharge: 2016-06-02 | Disposition: A | Discharge status: Home / Self Care | Payer: 59 | Source: Ambulatory Visit | Attending: Internal Medicine | Admitting: Internal Medicine

## 2016-06-02 DIAGNOSIS — Z1231 Encounter for screening mammogram for malignant neoplasm of breast: Secondary | ICD-10-CM

## 2016-06-04 ENCOUNTER — Other Ambulatory Visit: Payer: Self-pay | Admitting: *Deleted

## 2016-06-04 LAB — HM MAMMOGRAPHY

## 2016-06-04 MED ORDER — OMEPRAZOLE 20 MG PO CPDR: 20.0000 mg | DELAYED_RELEASE_CAPSULE | Freq: Every day | ORAL | 3 refills | Status: DC

## 2016-06-18 ENCOUNTER — Other Ambulatory Visit: Payer: Self-pay | Admitting: General Surgery

## 2016-06-18 NOTE — H&P (Signed)
History of Present Illness Latasha Ruff MD; 2/87/6811 11:08 AM) The patient is a 60 year old female presenting for a post-operative visit. 60 year old female status post total proctocolectomy with small bowel resection who presents to the office after prolonged hospitalization for ostomy revision and fistula management. Over the past month we have been able to slowly increase her diet and she is now off of TPN. She has no fistula output and the wound is healing well. She continues to have some pouching difficulties due to her midline wound that is slowly healing. She is having good ostomy output and is tolerating a diet without major difficulties. She did see the ostomy clinic down at [facility] and they have given her a regimen that has improved her skin dramatically. She is now able to keep the pouch on for 2-4 days without difficulties.   Problem List/Past Medical Latasha Ruff, MD; 5/72/6203 11:08 AM) HISTORY OF CREATION OF OSTOMY (Z87.898) PERISTOMAL DERMATITIS ASSOCIATED WITH MOISTURE (L30.8) YEAST DERMATITIS (B37.2)  Other Problems Latasha Ruff, MD; 5/59/7416 11:08 AM) Ulcerative Colitis Hypercholesterolemia High blood pressure  Past Surgical History Latasha Ruff, MD; 3/84/5364 11:08 AM) Colon Polyp Removal - Colonoscopy Hysterectomy (not due to cancer) - Partial  Diagnostic Studies History Latasha Ruff, MD; 6/80/3212 11:08 AM) Colonoscopy within last year Mammogram within last year Pap Smear >5 years ago  Allergies Marjean Donna, [place]; 06/18/2016 9:28 AM) No Known Drug Allergies 05/14/2015  Medication History Latasha Ruff, MD; 2/48/2500 11:08 AM) Ondansetron (4MG Tablet Disint, 1 (one) Tablet Oral two times daily, as needed, Taken starting 12/09/2015) Active. Lomotil (2.5-0.025MG Tablet, 1 (one) Tablet Oral qid prn, Taken starting 02/24/2016) Active. Imodium A-D (2MG Capsule, 1 (one) Capsule Oral qid prn, Taken starting 03/10/2016) Active. Flonase  Allergy Relief (50MCG/ACT Suspension, 3 (three) Spray Nasal three times a week, Taken starting 03/10/2016) Active. (Apply 3-4 puffs around peristomal skin hold 5-6 inches above skin. With ostomy pouch changes. *Use Flonase non-greasy formula.*) Remicade (100MG For Solution, Intravenous) Active. Glycopyrrolate (2MG Tablet, Oral) Active. Potassium Chloride Crys ER (20MEQ Tablet ER, Oral) Active. PredniSONE (10MG Tablet, Oral) Active. Oscal 500/200 D-3 (500-200MG-UNIT Tablet, Oral) Active. Ultram (50MG Tablet, Oral as needed) Active. Zofran (4MG Tablet, Oral as needed) Active. Lialda (1.2GM Tablet DR, Oral) Active. Benicar HCT (40-12.5MG Tablet, Oral) Active. ALPRAZolam (0.25MG Tablet, Oral) Active. Cephalexin (500MG Capsule, Oral) Active. Diphenoxylate-Atropine (2.5-0.025MG Tablet, Oral) Active. Famotidine (20MG Tablet, Oral) Active. Hydrocortisone (1% Cream, External) Active. Loperamide HCl (2MG Capsule, Oral) Active. OxyCODONE HCl (5MG/5ML Solution, Oral) Active. Medications Reconciled Nystatin (100000 UNIT/GM Powder, 1 (one) Powder External three times daily, Taken starting 06/18/2016) Active.  Social History Latasha Ruff, MD; 3/70/4888 11:08 AM) Tobacco use Never smoker. No drug use Alcohol use Occasional alcohol use. Caffeine use Carbonated beverages, Coffee, Tea.  Family History Latasha Ruff, MD; 07/25/9449 11:08 AM) Ischemic Bowel Disease Brother, Sister. Alcohol Abuse Mother. Arthritis Mother. Hypertension Brother, Father, Mother, Sister. Breast Cancer Mother, Sister. Heart Disease Father. Diabetes Mellitus Sister. Depression Mother, Sister. Colon Polyps Brother.  Pregnancy / Birth History Latasha Ruff, MD; 3/88/8280 11:08 AM) Age of menopause 40-60 Gravida 6 Irregular periods Age at menarche 69 years. Maternal age 55-20 Para 2     Review of Systems Latasha Ruff MD; 0/34/9179 11:08 AM) Skin Not Present- Bruising,  Dryness and Hair Loss. Respiratory Not Present- Chronic Cough, Decreased Exercise Tolerance, Difficulty Breathing and Sputum Production. Cardiovascular Present- Shortness of Breath and Swelling of Extremities. Not Present- Chest Pain, Difficulty Breathing Lying Down, Leg Cramps, Palpitations and Rapid Heart Rate. Gastrointestinal  Present- Abdominal Pain and Bloating. Not Present- Bloody Stool, Change in Bowel Habits, Chronic diarrhea, Constipation, Difficulty Swallowing, Excessive gas, Gets full quickly at meals, Hemorrhoids, Indigestion, Nausea, Rectal Pain and Vomiting. Female Genitourinary Not Present- Blood in Urine, Change In Bladder Habits and Difficulty Emptying Bladder. Musculoskeletal Present- Joint Pain and Joint Stiffness. Not Present- Back Pain and Joint Redness. Neurological Not Present- Decreased Memory, Difficulty Speaking and Dizziness.  Vitals (Sonya Bynum CMA; 06/18/2016 9:28 AM) 06/18/2016 9:28 AM Weight: 171 lb Height: 64in Body Surface Area: 1.83 m Body Mass Index: 29.35 kg/m  Temp.: 32F(Temporal)  Pulse: 76 (Regular)  BP: 126/80 (Sitting, Left Arm, Standard)      Physical Exam Latasha Ruff MD; 1/61/0960 11:09 AM)  General Mental Status-Alert. General Appearance-Cooperative.  Integumentary Note: Midline wound almost completely healed. Small strip of granulation tissue noted on photos.  Chest and Lung Exam Chest and lung exam reveals -on auscultation, normal breath sounds, no adventitious sounds and normal vocal resonance.  Cardiovascular Auscultation Heart Sounds - Normal heart sounds.  Abdomen Inspection Inspection of the abdomen reveals - Note: Soft nondistended, fistula site healed, peristomal skin much improved.    Assessment & Plan Latasha Ruff MD; 4/54/0981 10:11 AM)  HISTORY OF CREATION OF OSTOMY (X91.478) Impression: 60 year old female with a history of ulcerative colitis who underwent an emergent procedure for a  necrotic ostomy and December 2016. She has been healing from this emergent surgery since then. She has a midline ostomy and has experienced quite a bit of dermatitis associated with this. I think it is time to resect this and give her a right lower quadrant ileostomy again.

## 2016-08-06 LAB — HM DIABETES EYE EXAM

## 2016-08-12 ENCOUNTER — Encounter (HOSPITAL_COMMUNITY)
Admission: RE | Admit: 2016-08-12 | Discharge: 2016-08-12 | Disposition: A | Discharge status: Home / Self Care | Payer: 59 | Source: Ambulatory Visit | Attending: General Surgery | Admitting: General Surgery

## 2016-08-12 ENCOUNTER — Encounter (HOSPITAL_COMMUNITY): Payer: Self-pay

## 2016-08-12 DIAGNOSIS — Z01812 Encounter for preprocedural laboratory examination: Secondary | ICD-10-CM | POA: Insufficient documentation

## 2016-08-12 DIAGNOSIS — K9413 Enterostomy malfunction: Secondary | ICD-10-CM | POA: Insufficient documentation

## 2016-08-12 NOTE — Patient Instructions (Signed)
Latasha Wood  08/12/2016   Your procedure is scheduled on: Thursday 08/20/2016  Report to [facility]  Entrance take [place]  elevators to 3rd floor to  [place] at 0700 AM.  Call this number if you have problems the morning of surgery [phone]   Remember: ONLY 1 PERSON MAY GO WITH YOU TO SHORT STAY TO GET  READY MORNING OF [place].             If Dr. Marcello Moores office gave you a bowel prep, follow those instructions with a clear liquid diet the day before surgery!        CLEAR LIQUID DIET   Foods Allowed                                                                     Foods Excluded  Coffee and tea, regular and decaf                             liquids that you cannot  Plain Jell-O in any flavor                                             see through such as: Fruit ices (not with fruit pulp)                                     milk, soups, orange juice  Iced Popsicles                                    All solid food Carbonated beverages, regular and diet                                    Cranberry, grape and apple juices Sports drinks like Gatorade Lightly seasoned clear broth or consume(fat free) Sugar, honey syrup  Sample Menu Breakfast                                Lunch                                     Supper Cranberry juice                    Beef broth                            Chicken broth Jell-O  Grape juice                           Apple juice Coffee or tea                        Jell-O                                      Popsicle                                                Coffee or tea                        Coffee or tea  _____________________________________________________________________     Do not eat food or drink liquids :After Midnight.     Take these medicines the morning of surgery with A SIP OF WATER: Prilosec if needed                     You may not have any metal on your body including hair pins and              piercings  Do not wear jewelry, make-up, lotions, powders or perfumes, deodorant             Do not wear nail polish.  Do not shave  48 hours prior to surgery.              Men may shave face and neck.   Do not bring valuables to the hospital. [place].  Contacts, dentures or bridgework may not be worn into surgery.  Leave suitcase in the car. After surgery it may be brought to your room.                  Please read over the following fact sheets you were given: _____________________________________________________________________             [facility] - Preparing for Surgery Before surgery, you can play an important role.  Because skin is not sterile, your skin needs to be as free of germs as possible.  You can reduce the number of germs on your skin by washing with CHG (chlorahexidine gluconate) soap before surgery.  CHG is an antiseptic cleaner which kills germs and bonds with the skin to continue killing germs even after washing. Please DO NOT use if you have an allergy to CHG or antibacterial soaps.  If your skin becomes reddened/irritated stop using the CHG and inform your nurse when you arrive at Short Stay. Do not shave (including legs and underarms) for at least 48 hours prior to the first CHG shower.  You may shave your face/neck. Please follow these instructions carefully:  1.  Shower with CHG Soap the night before surgery and the  morning of Surgery.  2.  If you choose to wash your hair, wash your hair first as usual with your  normal  shampoo.  3.  After you shampoo, rinse your hair and body thoroughly to remove the  shampoo.  4.  Use CHG as you would any other liquid soap.  You can apply chg directly  to the skin and wash                       Gently with a scrungie or clean washcloth.  5.  Apply the CHG Soap to your body  ONLY FROM THE NECK DOWN.   Do not use on face/ open                           Wound or open sores. Avoid contact with eyes, ears mouth and genitals (private parts).                       Wash face,  Genitals (private parts) with your normal soap.             6.  Wash thoroughly, paying special attention to the area where your surgery  will be performed.  7.  Thoroughly rinse your body with warm water from the neck down.  8.  DO NOT shower/wash with your normal soap after using and rinsing off  the CHG Soap.                9.  Pat yourself dry with a clean towel.            10.  Wear clean pajamas.            11.  Place clean sheets on your bed the night of your first shower and do not  sleep with pets. Day of Surgery : Do not apply any lotions/deodorants the morning of surgery.  Please wear clean clothes to the hospital/surgery center.  FAILURE TO FOLLOW THESE INSTRUCTIONS MAY RESULT IN THE CANCELLATION OF YOUR SURGERY PATIENT SIGNATURE_________________________________  NURSE SIGNATURE__________________________________  ________________________________________________________________________

## 2016-08-12 NOTE — Progress Notes (Signed)
12/11/2015-noted in [facility] and CXR 08/01/2015- noted ECHO in EPIC. 08/04/2016-labs from [facility] on [place] comprehensive panel, C-reactive Protein, QUANT, CBC w/diff.,CMP, Sedimentation rate, TSH,Vitamin D25, ANA reflex to titers/patterns

## 2016-08-12 NOTE — Consult Note (Signed)
[place] Nurse ostomy consult note Stoma type/location: Midline Stomal assessment/size: 1 and 1/4 inch oval stoma, slightly budded, red, moist Peristomal assessment: not seen today Treatment options for stomal/peristomal skin: complex, multi-step regimen in place consisting of two cleanings (one with a dilute hypochlorite solution), application of Flonase antihistamine product, nystatin antifungal powder, various rings and pastes and finally a convex pouch with micropore tape, and a belt. Output: brown loose effluent consistent with expectations for an ileostomy  Ostomy pouching: 1pc convex.(see above)   Discussed surgical procedure and stoma relocatoin with patient and husband.  Patient is appropriately anxious, but trusting and looking forward to a new normal following this stoma relocation.  Examined patient sitting, and standing in order to place the marking in the patient's visual field, away from any creases or abdominal contour issues and within the rectus muscle.     Marked for ileostomy in the RLQ  7.5cm to the right of the umbilicus and  1cm below the umbilicus.  There is a crease running from the old ileostomy scar across the umbilicus and into the LUQ that should be avoided if at all possible.  Patient's abdomen cleansed with CHG wipes at site marking, allowed to air dry prior to marking. Covered mark with thin film transparent dressing to preserve mark until date of surgery.   [facility] nursing team will follow, and will remain available to this patient, the nursing, surgical and medical teams. I will be out of the facility next week and my partners will see Latasha Wood in my absence. Thanks, Maudie Flakes, MSN, RN, [place], Arther Abbott  Pager# [phone]

## 2016-08-13 LAB — HM DIABETES EYE EXAM

## 2016-08-17 ENCOUNTER — Encounter: Payer: Self-pay | Admitting: Internal Medicine

## 2016-08-17 ENCOUNTER — Other Ambulatory Visit: Payer: Self-pay | Admitting: Internal Medicine

## 2016-08-17 ENCOUNTER — Other Ambulatory Visit (INDEPENDENT_AMBULATORY_CARE_PROVIDER_SITE_OTHER): Payer: 59

## 2016-08-17 ENCOUNTER — Ambulatory Visit (INDEPENDENT_AMBULATORY_CARE_PROVIDER_SITE_OTHER): Payer: 59 | Admitting: Internal Medicine

## 2016-08-17 VITALS — BP 124/70 | HR 78 | Temp 98.0°F | Resp 16 | Ht 64.0 in | Wt 174.0 lb

## 2016-08-17 DIAGNOSIS — D510 Vitamin B12 deficiency anemia due to intrinsic factor deficiency: Secondary | ICD-10-CM

## 2016-08-17 DIAGNOSIS — E785 Hyperlipidemia, unspecified: Secondary | ICD-10-CM

## 2016-08-17 DIAGNOSIS — E041 Nontoxic single thyroid nodule: Secondary | ICD-10-CM

## 2016-08-17 DIAGNOSIS — I10 Essential (primary) hypertension: Secondary | ICD-10-CM

## 2016-08-17 DIAGNOSIS — E118 Type 2 diabetes mellitus with unspecified complications: Secondary | ICD-10-CM

## 2016-08-17 DIAGNOSIS — E781 Pure hyperglyceridemia: Secondary | ICD-10-CM

## 2016-08-17 DIAGNOSIS — H9193 Unspecified hearing loss, bilateral: Secondary | ICD-10-CM | POA: Insufficient documentation

## 2016-08-17 LAB — HEMOGLOBIN A1C: Hgb A1c MFr Bld: 5.8 % (ref 4.6–6.5)

## 2016-08-17 LAB — IBC PANEL
IRON: 72 ug/dL (ref 42–145)
Saturation Ratios: 21 % (ref 20.0–50.0)
TRANSFERRIN: 245 mg/dL (ref 212.0–360.0)

## 2016-08-17 LAB — COMPREHENSIVE METABOLIC PANEL
ALT: 14 U/L (ref 0–35)
AST: 17 U/L (ref 0–37)
Albumin: 3.5 g/dL (ref 3.5–5.2)
Alkaline Phosphatase: 97 U/L (ref 39–117)
BUN: 15 mg/dL (ref 6–23)
CHLORIDE: 103 meq/L (ref 96–112)
CO2: 29 meq/L (ref 19–32)
CREATININE: 0.88 mg/dL (ref 0.40–1.20)
Calcium: 9.3 mg/dL (ref 8.4–10.5)
GFR: 83.89 mL/min (ref >60.00)
Glucose, Bld: 90 mg/dL (ref 70–99)
Potassium: 3.8 mEq/L (ref 3.5–5.1)
SODIUM: 137 meq/L (ref 135–145)
Total Bilirubin: 0.5 mg/dL (ref 0.2–1.2)
Total Protein: 6.9 g/dL (ref 6.0–8.3)

## 2016-08-17 LAB — LIPID PANEL
CHOLESTEROL: 194 mg/dL (ref 0–200)
HDL: 77 mg/dL (ref >39.00)
LDL CALC: 92 mg/dL (ref 0–99)
NONHDL: 117.4
Total CHOL/HDL Ratio: 3
Triglycerides: 127 mg/dL (ref 0.0–149.0)
VLDL: 25.4 mg/dL (ref 0.0–40.0)

## 2016-08-17 LAB — RETICULOCYTES
ABS Retic: 35460 cells/uL (ref 20000–80000)
RBC.: 3.94 MIL/uL (ref 3.80–5.10)
Retic Ct Pct: 0.9 %

## 2016-08-17 LAB — CBC WITH DIFFERENTIAL/PLATELET
Basophils Absolute: 0 K/uL (ref 0.0–0.1)
Basophils Relative: 0.4 % (ref 0.0–3.0)
Eosinophils Absolute: 0.2 K/uL (ref 0.0–0.7)
Eosinophils Relative: 2.5 % (ref 0.0–5.0)
HCT: 34.5 % — ABNORMAL LOW (ref 36.0–46.0)
Hemoglobin: 11.6 g/dL — ABNORMAL LOW (ref 12.0–15.0)
Lymphocytes Relative: 27.1 % (ref 12.0–46.0)
Lymphs Abs: 2.2 K/uL (ref 0.7–4.0)
MCHC: 33.5 g/dL (ref 30.0–36.0)
MCV: 86.6 fl (ref 78.0–100.0)
Monocytes Absolute: 0.4 K/uL (ref 0.1–1.0)
Monocytes Relative: 5 % (ref 3.0–12.0)
Neutro Abs: 5.3 K/uL (ref 1.4–7.7)
Neutrophils Relative %: 65 % (ref 43.0–77.0)
Platelets: 308 K/uL (ref 150.0–400.0)
RBC: 3.98 Mil/uL (ref 3.87–5.11)
RDW: 13.6 % (ref 11.5–15.5)
WBC: 8.1 K/uL (ref 4.0–10.5)

## 2016-08-17 LAB — FOLATE: Folate: 14.7 ng/mL (ref >5.9)

## 2016-08-17 LAB — VITAMIN B12: Vitamin B-12: 330 pg/mL (ref 211–911)

## 2016-08-17 LAB — TSH: TSH: 1.15 u[IU]/mL (ref 0.35–4.50)

## 2016-08-17 LAB — FERRITIN: FERRITIN: 60.1 ng/mL (ref 10.0–291.0)

## 2016-08-17 NOTE — Patient Instructions (Signed)
Anemia, Nonspecific Anemia is a condition in which the concentration of red blood cells or hemoglobin in the blood is below normal. Hemoglobin is a substance in red blood cells that carries oxygen to the tissues of the body. Anemia results in not enough oxygen reaching these tissues.  CAUSES  Common causes of anemia include:   Excessive bleeding. Bleeding may be internal or external. This includes excessive bleeding from periods (in women) or from the intestine.   Poor nutrition.   Chronic kidney, thyroid, and liver disease.  Bone marrow disorders that decrease red blood cell production.  Cancer and treatments for cancer.  HIV, AIDS, and their treatments.  Spleen problems that increase red blood cell destruction.  Blood disorders.  Excess destruction of red blood cells due to infection, medicines, and autoimmune disorders. SIGNS AND SYMPTOMS   Minor weakness.   Dizziness.   Headache.  Palpitations.   Shortness of breath, especially with exercise.   Paleness.  Cold sensitivity.  Indigestion.  Nausea.  Difficulty sleeping.  Difficulty concentrating. Symptoms may occur suddenly or they may develop slowly.  DIAGNOSIS  Additional blood tests are often needed. These help your health care provider determine the best treatment. Your health care provider will check your stool for blood and look for other causes of blood loss.  TREATMENT  Treatment varies depending on the cause of the anemia. Treatment can include:   Supplements of iron, vitamin B12, or folic acid.   Hormone medicines.   A blood transfusion. This may be needed if blood loss is severe.   Hospitalization. This may be needed if there is significant continual blood loss.   Dietary changes.  Spleen removal. HOME CARE INSTRUCTIONS Keep all follow-up appointments. It often takes many weeks to correct anemia, and having your health care provider check on your condition and your response to  treatment is very important. SEEK IMMEDIATE MEDICAL CARE IF:   You develop extreme weakness, shortness of breath, or chest pain.   You become dizzy or have trouble concentrating.  You develop heavy vaginal bleeding.   You develop a rash.   You have bloody or black, tarry stools.   You faint.   You vomit up blood.   You vomit repeatedly.   You have abdominal pain.  You have a fever or persistent symptoms for more than 2-3 days.   You have a fever and your symptoms suddenly get worse.   You are dehydrated.  MAKE SURE YOU:  Understand these instructions.  Will watch your condition.  Will get help right away if you are not doing well or get worse.   This information is not intended to replace advice given to you by your health care provider. Make sure you discuss any questions you have with your health care provider.   Document Released: 12/03/2004 Document Revised: 06/28/2013 Document Reviewed: 04/21/2013 Elsevier Interactive Patient Education 2016 Elsevier Inc.  

## 2016-08-17 NOTE — Progress Notes (Signed)
Pre visit review using our clinic review tool, if applicable. No additional management support is needed unless otherwise documented below in the visit note. 

## 2016-08-17 NOTE — Progress Notes (Signed)
Report from [facility] for pt eye exam. There was no DR found at the time of the exam.

## 2016-08-17 NOTE — Progress Notes (Signed)
Subjective:  Patient ID: Latasha Wood, female    DOB: 06-18-1956  Age: 60 y.o. MRN: [id number]  CC: Hyperlipidemia; Hypertension; Diabetes; and Anemia   HPI Latasha Wood presents for follow-up. She complains of decreased level of hearing and discomfort in both ears for about a year or 2. She describes the earache as a deep achy sensation. She has not recently had her hearing tested. She also has chronic unchanged symptoms of shortness of breath, fatigue, and intermittent diarrhea. She denies abdominal pain, vomiting, loss of appetite, or weight loss.  No facility-administered medications prior to visit.    Outpatient Medications Prior to Visit  Medication Sig Dispense Refill  . acetaminophen (TYLENOL) 650 MG CR tablet Take 650 mg by mouth every 8 (eight) hours as needed for pain.    Marland Kitchen ALPRAZolam (XANAX) 0.25 MG tablet Take 1 tablet (0.25 mg total) by mouth 2 (two) times daily as needed for anxiety. 60 tablet 3  . diphenoxylate-atropine (LOMOTIL) 2.5-0.025 MG tablet Take 1 tablet by mouth 4 (four) times daily. (Patient taking differently: Take 1 tablet by mouth 3 (three) times daily. ) 60 tablet 3  . famotidine (PEPCID) 20 MG tablet Take 1 tablet (20 mg total) by mouth 2 (two) times daily. (Patient taking differently: Take 20 mg by mouth 2 (two) times daily as needed for heartburn or indigestion. ) 60 tablet 3  . fluticasone (FLONASE) 50 MCG/ACT nasal spray 3-4 sprays once a week. Around peristomal skin--hold 5-6 inches away from skin  2  . hydrocortisone cream 1 % Apply topically as needed for itching. (Patient taking differently: Apply 1 application topically 2 (two) times daily as needed for itching. ) 30 g 0  . loperamide (IMODIUM) 2 MG capsule Take 1 capsule (2 mg total) by mouth 3 (three) times daily. 60 capsule 3  . NYSTATIN powder APPLY POWDER TO AFFECTED AREA THREE TIMES WEEKLY AROUND PERISTOMAL SKIN  3  . omeprazole (PRILOSEC) 20 MG capsule Take 1 capsule (20 mg total) by  mouth daily. (Patient taking differently: Take 20 mg by mouth daily as needed (for acid reflux.). ) 30 capsule 3  . traMADol (ULTRAM) 50 MG tablet Take 1 tablet (50 mg total) by mouth every 8 (eight) hours. (Patient taking differently: Take 50 mg by mouth every 8 (eight) hours as needed (for pain.). ) 65 tablet 5  . olmesartan-hydrochlorothiazide (BENICAR HCT) 40-12.5 MG tablet Take 1 tablet by mouth daily.       ROS Review of Systems  Constitutional: Positive for fatigue. Negative for activity change, appetite change, fever and unexpected weight change.  HENT: Positive for ear pain and hearing loss.   Eyes: Negative.   Respiratory: Positive for shortness of breath. Negative for cough, choking, chest tightness, wheezing and stridor.   Cardiovascular: Negative.  Negative for chest pain, palpitations and leg swelling.  Gastrointestinal: Positive for diarrhea. Negative for abdominal pain, blood in stool, constipation, nausea and vomiting.  Endocrine: Negative.   Genitourinary: Negative.  Negative for difficulty urinating.  Musculoskeletal: Positive for arthralgias. Negative for back pain, myalgias and neck pain.  Skin: Negative.   Allergic/Immunologic: Negative.   Neurological: Negative.  Negative for dizziness, tremors, weakness, light-headedness, numbness and headaches.  Hematological: Negative.  Negative for adenopathy. Does not bruise/bleed easily.  Psychiatric/Behavioral: Negative.     Objective:  BP 124/70 (BP Location: Left Arm, Patient Position: Sitting, Cuff Size: Normal)   Pulse 78   Temp 98 F (36.7 C) (Oral)   Resp 16  Ht 5' 4"  (1.626 m)   Wt 174 lb (78.9 kg)   SpO2 98%   BMI 29.87 kg/m   BP Readings from Last 3 Encounters:  08/20/16 110/79  08/17/16 124/70  08/12/16 119/79    Wt Readings from Last 3 Encounters:  08/20/16 174 lb (78.9 kg)  08/17/16 174 lb (78.9 kg)  08/12/16 170 lb 9.6 oz (77.4 kg)    Physical Exam  Constitutional: She is oriented to person,  place, and time. No distress.  HENT:  Head: Normocephalic and atraumatic.  Right Ear: Hearing, tympanic membrane, external ear and ear canal normal.  Left Ear: Hearing, tympanic membrane, external ear and ear canal normal.  Mouth/Throat: Oropharynx is clear and moist. Mucous membranes are pale, not dry and not cyanotic. No oropharyngeal exudate.  Eyes: Conjunctivae are normal. Right eye exhibits no discharge. Left eye exhibits no discharge. No scleral icterus.  Neck: Normal range of motion. Neck supple. No JVD present. No tracheal tenderness present. No tracheal deviation present. Thyroid mass present. No thyromegaly present.  2 x 3 cm soft, freely mobile mass in her right thyroid gland. It is nontender, non-fixed, and nonfluctuant.  Cardiovascular: Normal rate, regular rhythm, normal heart sounds and intact distal pulses.  Exam reveals no gallop and no friction rub.   No murmur heard. Pulmonary/Chest: Effort normal and breath sounds normal. No stridor. No respiratory distress. She has no wheezes. She has no rales. She exhibits no tenderness.  Abdominal: Soft. Bowel sounds are normal. She exhibits no distension and no mass. There is no tenderness. There is no rebound and no guarding.  Musculoskeletal: Normal range of motion. She exhibits no edema, tenderness or deformity.  Lymphadenopathy:    She has no cervical adenopathy.  Neurological: She is oriented to person, place, and time.  Skin: Skin is warm and dry. No rash noted. She is not diaphoretic. No erythema. No pallor.  Vitals reviewed.   Lab Results  Component Value Date   WBC 8.1 08/17/2016   HGB 11.6 (L) 08/17/2016   HCT 34.5 (L) 08/17/2016   PLT 308.0 08/17/2016   GLUCOSE 90 08/17/2016   CHOL 194 08/17/2016   TRIG 127.0 08/17/2016   HDL 77.00 08/17/2016   LDLDIRECT 91.0 03/23/2016   LDLCALC 92 08/17/2016   ALT 14 08/17/2016   AST 17 08/17/2016   NA 137 08/17/2016   K 3.8 08/17/2016   CL 103 08/17/2016   CREATININE 0.88  08/17/2016   BUN 15 08/17/2016   CO2 29 08/17/2016   TSH 1.15 08/17/2016   INR 1.16 12/11/2015   HGBA1C 5.8 08/17/2016   MICROALBUR 1.1 03/23/2016    No results found.  Assessment & Plan:   Latasha Wood was seen today for hyperlipidemia, hypertension, diabetes and anemia.  Diagnoses and all orders for this visit:  Essential hypertension- her blood pressures well controlled, electrolytes and renal function are stable. -     Comprehensive metabolic panel; Future -     Cancel: Urinalysis, Routine w reflex microscopic (not at [facility]); Future  Pernicious anemia- her hemoglobin and hematocrit are stable, her vitamin levels are all normal, this is most likely related to anemia of chronic disease related to rheumatological disease and ulcerative colitis, will continue to follow this. -     CBC with Differential/Platelet; Future -     IBC panel; Future -     Vitamin B12; Future -     Ferritin; Future -     Folate; Future -     Reticulocytes; Future  Pure hyperglyceridemia- improvement noted -     Lipid panel; Future  Hyperlipidemia LDL goal <70- she has achieved her LDL goal -     Lipid panel; Future -     TSH; Future  Type 2 diabetes mellitus with complication, without long-term current use of insulin ([place])- her A1c is down to 5.8%, blood sugars are very well controlled on no medications. -     Hemoglobin A1c; Future -     Cancel: Microalbumin / creatinine urine ratio; Future  Hearing loss associated with syndrome of both ears -     Ambulatory referral to ENT  Right thyroid nodule- will get an ultrasound of the thyroid gland to see if this nodule is suspicious for malignancy and of asked her to see an ENT doctor to consider biopsy and/or excision. -     Ambulatory referral to ENT -     US Soft Tissue Head/Neck; Future   I am having Latasha Wood maintain her acetaminophen, famotidine, hydrocortisone cream, diphenoxylate-atropine, loperamide, nystatin, traMADol, ALPRAZolam,  omeprazole, and fluticasone.  No orders of the defined types were placed in this encounter.    Follow-up: Return in about 3 months (around 11/17/2016).  Scarlette Calico, MD

## 2016-08-20 ENCOUNTER — Encounter: Payer: Self-pay | Admitting: Internal Medicine

## 2016-08-20 ENCOUNTER — Encounter (HOSPITAL_COMMUNITY): Payer: Self-pay | Admitting: Certified Registered Nurse Anesthetist

## 2016-08-20 ENCOUNTER — Inpatient Hospital Stay (HOSPITAL_COMMUNITY): Payer: 59 | Admitting: Certified Registered Nurse Anesthetist

## 2016-08-20 ENCOUNTER — Encounter (HOSPITAL_COMMUNITY)
Admission: RE | Disposition: A | Discharge status: Home / Self Care | Payer: Self-pay | Source: Ambulatory Visit | Attending: General Surgery

## 2016-08-20 ENCOUNTER — Inpatient Hospital Stay (HOSPITAL_COMMUNITY)
Admission: RE | Admit: 2016-08-20 | Discharge: 2016-09-05 | DRG: 329 | Disposition: A | Discharge status: Home / Self Care | Payer: 59 | Source: Ambulatory Visit | Attending: General Surgery | Admitting: General Surgery

## 2016-08-20 DIAGNOSIS — I10 Essential (primary) hypertension: Secondary | ICD-10-CM | POA: Diagnosis present

## 2016-08-20 DIAGNOSIS — K9413 Enterostomy malfunction: Principal | ICD-10-CM | POA: Diagnosis present

## 2016-08-20 DIAGNOSIS — K567 Ileus, unspecified: Secondary | ICD-10-CM

## 2016-08-20 DIAGNOSIS — Y833 Surgical operation with formation of external stoma as the cause of abnormal reaction of the patient, or of later complication, without mention of misadventure at the time of the procedure: Secondary | ICD-10-CM | POA: Diagnosis present

## 2016-08-20 DIAGNOSIS — K9131 Postprocedural partial intestinal obstruction: Secondary | ICD-10-CM | POA: Diagnosis not present

## 2016-08-20 DIAGNOSIS — H9193 Unspecified hearing loss, bilateral: Secondary | ICD-10-CM | POA: Diagnosis present

## 2016-08-20 DIAGNOSIS — R110 Nausea: Secondary | ICD-10-CM | POA: Diagnosis not present

## 2016-08-20 DIAGNOSIS — K219 Gastro-esophageal reflux disease without esophagitis: Secondary | ICD-10-CM | POA: Diagnosis present

## 2016-08-20 DIAGNOSIS — Z6829 Body mass index (BMI) 29.0-29.9, adult: Secondary | ICD-10-CM

## 2016-08-20 DIAGNOSIS — E119 Type 2 diabetes mellitus without complications: Secondary | ICD-10-CM | POA: Diagnosis present

## 2016-08-20 DIAGNOSIS — E43 Unspecified severe protein-calorie malnutrition: Secondary | ICD-10-CM | POA: Diagnosis not present

## 2016-08-20 DIAGNOSIS — E785 Hyperlipidemia, unspecified: Secondary | ICD-10-CM | POA: Diagnosis present

## 2016-08-20 DIAGNOSIS — R739 Hyperglycemia, unspecified: Secondary | ICD-10-CM | POA: Diagnosis present

## 2016-08-20 DIAGNOSIS — K56609 Unspecified intestinal obstruction, unspecified as to partial versus complete obstruction: Secondary | ICD-10-CM

## 2016-08-20 DIAGNOSIS — K660 Peritoneal adhesions (postprocedural) (postinfection): Secondary | ICD-10-CM | POA: Diagnosis present

## 2016-08-20 DIAGNOSIS — Z789 Other specified health status: Secondary | ICD-10-CM

## 2016-08-20 HISTORY — PX: ILEOSTOMY CLOSURE: SHX1784

## 2016-08-20 SURGERY — REVISION, ILEOSTOMY
Anesthesia: General | Site: Abdomen

## 2016-08-20 MED ORDER — LACTATED RINGERS IV SOLN
INTRAVENOUS | Status: DC | PRN
  Administered 2016-08-20 (×3): via INTRAVENOUS

## 2016-08-20 MED ORDER — GLYCOPYRROLATE 0.2 MG/ML IV SOSY
PREFILLED_SYRINGE | INTRAVENOUS | Status: AC
  Filled 2016-08-20: qty 3

## 2016-08-20 MED ORDER — MORPHINE SULFATE (PF) 2 MG/ML IV SOLN
2.0000 mg | INTRAVENOUS | Status: DC | PRN
  Administered 2016-08-20 – 2016-08-21 (×5): 2 mg via INTRAVENOUS
  Administered 2016-08-21: 4 mg via INTRAVENOUS
  Administered 2016-08-21: 2 mg via INTRAVENOUS
  Administered 2016-08-21 – 2016-08-22 (×4): 4 mg via INTRAVENOUS
  Administered 2016-08-22: 2 mg via INTRAVENOUS
  Administered 2016-08-22 (×2): 4 mg via INTRAVENOUS
  Administered 2016-08-22: 2 mg via INTRAVENOUS
  Administered 2016-08-22: 4 mg via INTRAVENOUS
  Administered 2016-08-23: 2 mg via INTRAVENOUS
  Administered 2016-08-23 – 2016-08-24 (×3): 4 mg via INTRAVENOUS
  Administered 2016-08-25 (×3): 2 mg via INTRAVENOUS
  Administered 2016-08-25: 4 mg via INTRAVENOUS
  Administered 2016-08-25 – 2016-08-27 (×7): 2 mg via INTRAVENOUS
  Administered 2016-08-27 (×2): 4 mg via INTRAVENOUS
  Administered 2016-08-28 – 2016-08-31 (×8): 2 mg via INTRAVENOUS
  Filled 2016-08-20 (×3): qty 1
  Filled 2016-08-20: qty 2
  Filled 2016-08-20 (×3): qty 1
  Filled 2016-08-20 (×6): qty 2
  Filled 2016-08-20: qty 1
  Filled 2016-08-20 (×2): qty 2
  Filled 2016-08-20: qty 1
  Filled 2016-08-20: qty 2
  Filled 2016-08-20: qty 1
  Filled 2016-08-20: qty 2
  Filled 2016-08-20 (×6): qty 1
  Filled 2016-08-20: qty 2
  Filled 2016-08-20 (×4): qty 1
  Filled 2016-08-20: qty 2
  Filled 2016-08-20 (×3): qty 1
  Filled 2016-08-20: qty 2
  Filled 2016-08-20 (×2): qty 1
  Filled 2016-08-20: qty 2
  Filled 2016-08-20: qty 1
  Filled 2016-08-20: qty 2
  Filled 2016-08-20: qty 1
  Filled 2016-08-20: qty 2
  Filled 2016-08-20: qty 1
  Filled 2016-08-20: qty 2

## 2016-08-20 MED ORDER — MIDAZOLAM HCL 2 MG/2ML IJ SOLN
INTRAMUSCULAR | Status: AC
  Filled 2016-08-20: qty 2

## 2016-08-20 MED ORDER — CEFOTETAN DISODIUM 2 G IJ SOLR
2.0000 g | Freq: Two times a day (BID) | INTRAMUSCULAR | Status: AC
  Administered 2016-08-20: 2 g via INTRAVENOUS
  Filled 2016-08-20: qty 2

## 2016-08-20 MED ORDER — ONDANSETRON HCL 4 MG/2ML IJ SOLN
INTRAMUSCULAR | Status: DC | PRN
  Administered 2016-08-20: 4 mg via INTRAVENOUS

## 2016-08-20 MED ORDER — ENOXAPARIN SODIUM 40 MG/0.4ML ~~LOC~~ SOLN
40.0000 mg | SUBCUTANEOUS | Status: DC
  Administered 2016-08-21 – 2016-09-04 (×15): 40 mg via SUBCUTANEOUS
  Filled 2016-08-20 (×15): qty 0.4

## 2016-08-20 MED ORDER — FENTANYL CITRATE (PF) 250 MCG/5ML IJ SOLN
INTRAMUSCULAR | Status: AC
  Filled 2016-08-20: qty 5

## 2016-08-20 MED ORDER — HEPARIN SODIUM (PORCINE) 5000 UNIT/ML IJ SOLN
5000.0000 [IU] | Freq: Once | INTRAMUSCULAR | Status: AC
  Administered 2016-08-20: 5000 [IU] via SUBCUTANEOUS
  Filled 2016-08-20: qty 1

## 2016-08-20 MED ORDER — LIDOCAINE 2% (20 MG/ML) 5 ML SYRINGE
INTRAMUSCULAR | Status: DC | PRN
  Administered 2016-08-20: 80 mg via INTRAVENOUS

## 2016-08-20 MED ORDER — ACETAMINOPHEN 500 MG PO TABS
1000.0000 mg | ORAL_TABLET | ORAL | Status: AC
  Administered 2016-08-20: 1000 mg via ORAL
  Filled 2016-08-20: qty 2

## 2016-08-20 MED ORDER — ONDANSETRON HCL 4 MG PO TABS: 4.0000 mg | ORAL_TABLET | Freq: Four times a day (QID) | ORAL | Status: DC | PRN

## 2016-08-20 MED ORDER — ONDANSETRON HCL 4 MG/2ML IJ SOLN
INTRAMUSCULAR | Status: AC
  Filled 2016-08-20: qty 2

## 2016-08-20 MED ORDER — TRAMADOL HCL 50 MG PO TABS
50.0000 mg | ORAL_TABLET | Freq: Three times a day (TID) | ORAL | Status: DC | PRN
  Administered 2016-08-21: 50 mg via ORAL
  Filled 2016-08-20: qty 1

## 2016-08-20 MED ORDER — EPHEDRINE SULFATE-NACL 50-0.9 MG/10ML-% IV SOSY
PREFILLED_SYRINGE | INTRAVENOUS | Status: DC | PRN
  Administered 2016-08-20: 10 mg via INTRAVENOUS

## 2016-08-20 MED ORDER — METOPROLOL TARTRATE 5 MG/5ML IV SOLN
INTRAVENOUS | Status: DC | PRN
  Administered 2016-08-20 (×2): 1 mg via INTRAVENOUS

## 2016-08-20 MED ORDER — HYDROCHLOROTHIAZIDE 12.5 MG PO CAPS
12.5000 mg | ORAL_CAPSULE | Freq: Every day | ORAL | Status: DC
  Administered 2016-08-20 – 2016-08-28 (×9): 12.5 mg via ORAL
  Filled 2016-08-20 (×10): qty 1

## 2016-08-20 MED ORDER — ALPRAZOLAM 0.25 MG PO TABS
0.2500 mg | ORAL_TABLET | Freq: Two times a day (BID) | ORAL | Status: DC | PRN
  Administered 2016-08-24 – 2016-08-27 (×5): 0.25 mg via ORAL
  Filled 2016-08-20 (×6): qty 1

## 2016-08-20 MED ORDER — KCL IN DEXTROSE-NACL 20-5-0.45 MEQ/L-%-% IV SOLN
INTRAVENOUS | Status: DC
  Administered 2016-08-20 – 2016-08-23 (×6): via INTRAVENOUS
  Filled 2016-08-20 (×6): qty 1000

## 2016-08-20 MED ORDER — FAMOTIDINE 20 MG PO TABS
20.0000 mg | ORAL_TABLET | Freq: Two times a day (BID) | ORAL | Status: DC | PRN
  Administered 2016-08-25: 20 mg via ORAL
  Filled 2016-08-20: qty 1

## 2016-08-20 MED ORDER — SUGAMMADEX SODIUM 200 MG/2ML IV SOLN
INTRAVENOUS | Status: AC
  Filled 2016-08-20: qty 2

## 2016-08-20 MED ORDER — MORPHINE SULFATE (PF) 10 MG/ML IV SOLN
2.0000 mg | INTRAVENOUS | Status: DC | PRN
  Administered 2016-08-20: 2 mg via INTRAVENOUS
  Filled 2016-08-20: qty 1

## 2016-08-20 MED ORDER — 0.9 % SODIUM CHLORIDE (POUR BTL) OPTIME
TOPICAL | Status: DC | PRN
  Administered 2016-08-20: 2000 mL

## 2016-08-20 MED ORDER — ROCURONIUM BROMIDE 10 MG/ML (PF) SYRINGE
PREFILLED_SYRINGE | INTRAVENOUS | Status: DC | PRN
  Administered 2016-08-20: 50 mg via INTRAVENOUS

## 2016-08-20 MED ORDER — IRBESARTAN 300 MG PO TABS
300.0000 mg | ORAL_TABLET | Freq: Every day | ORAL | Status: DC
  Administered 2016-08-20 – 2016-09-05 (×16): 300 mg via ORAL
  Filled 2016-08-20 (×17): qty 1

## 2016-08-20 MED ORDER — FENTANYL CITRATE (PF) 100 MCG/2ML IJ SOLN
25.0000 ug | INTRAMUSCULAR | Status: DC | PRN
  Administered 2016-08-20 (×2): 50 ug via INTRAVENOUS

## 2016-08-20 MED ORDER — HYDROMORPHONE HCL 1 MG/ML IJ SOLN
INTRAMUSCULAR | Status: AC
  Filled 2016-08-20: qty 1

## 2016-08-20 MED ORDER — ALVIMOPAN 12 MG PO CAPS
12.0000 mg | ORAL_CAPSULE | Freq: Once | ORAL | Status: AC
  Administered 2016-08-20: 12 mg via ORAL
  Filled 2016-08-20: qty 1

## 2016-08-20 MED ORDER — ALVIMOPAN 12 MG PO CAPS
12.0000 mg | ORAL_CAPSULE | Freq: Two times a day (BID) | ORAL | Status: DC
  Administered 2016-08-21: 12 mg via ORAL
  Filled 2016-08-20 (×2): qty 1

## 2016-08-20 MED ORDER — ONDANSETRON HCL 4 MG/2ML IJ SOLN: 4.0000 mg | Freq: Four times a day (QID) | INTRAMUSCULAR | Status: DC | PRN

## 2016-08-20 MED ORDER — CEFOTETAN DISODIUM-DEXTROSE 2-2.08 GM-% IV SOLR
2.0000 g | INTRAVENOUS | Status: AC
  Administered 2016-08-20: 2 g via INTRAVENOUS

## 2016-08-20 MED ORDER — HYDROMORPHONE HCL 1 MG/ML IJ SOLN
0.2500 mg | INTRAMUSCULAR | Status: DC | PRN
  Administered 2016-08-20 (×4): 0.5 mg via INTRAVENOUS

## 2016-08-20 MED ORDER — PROPOFOL 10 MG/ML IV BOLUS
INTRAVENOUS | Status: AC
  Filled 2016-08-20: qty 20

## 2016-08-20 MED ORDER — CEFOTETAN DISODIUM-DEXTROSE 2-2.08 GM-% IV SOLR
INTRAVENOUS | Status: AC
  Filled 2016-08-20: qty 50

## 2016-08-20 MED ORDER — MIDAZOLAM HCL 5 MG/5ML IJ SOLN
INTRAMUSCULAR | Status: DC | PRN
  Administered 2016-08-20: 2 mg via INTRAVENOUS

## 2016-08-20 MED ORDER — FENTANYL CITRATE (PF) 100 MCG/2ML IJ SOLN
INTRAMUSCULAR | Status: AC
  Filled 2016-08-20: qty 2

## 2016-08-20 MED ORDER — OLMESARTAN MEDOXOMIL-HCTZ 40-12.5 MG PO TABS: 1.0000 | ORAL_TABLET | Freq: Every day | ORAL | Status: DC

## 2016-08-20 MED ORDER — ACETAMINOPHEN 500 MG PO TABS
1000.0000 mg | ORAL_TABLET | Freq: Four times a day (QID) | ORAL | Status: AC
  Administered 2016-08-20 – 2016-08-21 (×4): 1000 mg via ORAL
  Filled 2016-08-20 (×4): qty 2

## 2016-08-20 MED ORDER — PANTOPRAZOLE SODIUM 40 MG PO TBEC
40.0000 mg | DELAYED_RELEASE_TABLET | Freq: Every day | ORAL | Status: DC
  Administered 2016-08-20 – 2016-08-28 (×9): 40 mg via ORAL
  Filled 2016-08-20 (×10): qty 1

## 2016-08-20 MED ORDER — OXYCODONE HCL 5 MG/5ML PO SOLN
5.0000 mg | Freq: Once | ORAL | Status: DC | PRN
  Filled 2016-08-20: qty 5

## 2016-08-20 MED ORDER — OXYCODONE HCL 5 MG PO TABS: 5.0000 mg | ORAL_TABLET | Freq: Once | ORAL | Status: DC | PRN

## 2016-08-20 MED ORDER — METOPROLOL TARTRATE 5 MG/5ML IV SOLN
INTRAVENOUS | Status: AC
Start: 2016-08-20 — End: 2016-08-20
  Filled 2016-08-20: qty 5

## 2016-08-20 MED ORDER — FENTANYL CITRATE (PF) 100 MCG/2ML IJ SOLN
INTRAMUSCULAR | Status: DC | PRN
  Administered 2016-08-20 (×5): 50 ug via INTRAVENOUS

## 2016-08-20 MED ORDER — SUGAMMADEX SODIUM 200 MG/2ML IV SOLN
INTRAVENOUS | Status: DC | PRN
  Administered 2016-08-20: 200 mg via INTRAVENOUS

## 2016-08-20 MED ORDER — GABAPENTIN 300 MG PO CAPS
300.0000 mg | ORAL_CAPSULE | ORAL | Status: AC
  Administered 2016-08-20: 300 mg via ORAL
  Filled 2016-08-20: qty 1

## 2016-08-20 MED ORDER — GLYCOPYRROLATE 0.2 MG/ML IV SOSY
PREFILLED_SYRINGE | INTRAVENOUS | Status: DC | PRN
  Administered 2016-08-20: .3 mg via INTRAVENOUS

## 2016-08-20 MED ORDER — ONDANSETRON HCL 4 MG/2ML IJ SOLN
4.0000 mg | Freq: Four times a day (QID) | INTRAMUSCULAR | Status: DC | PRN
Start: 2016-08-20 — End: 2016-09-05
  Administered 2016-08-20 – 2016-09-01 (×28): 4 mg via INTRAVENOUS
  Filled 2016-08-20 (×31): qty 2

## 2016-08-20 MED ORDER — PROPOFOL 10 MG/ML IV BOLUS
INTRAVENOUS | Status: DC | PRN
  Administered 2016-08-20: 50 mg via INTRAVENOUS
  Administered 2016-08-20: 150 mg via INTRAVENOUS

## 2016-08-20 SURGICAL SUPPLY — 49 items
BLADE HEX COATED 2.75 (ELECTRODE) ×3 IMPLANT
CELLS DAT CNTRL 66122 CELL SVR (MISCELLANEOUS) ×1 IMPLANT
CHLORAPREP W/TINT 26ML (MISCELLANEOUS) IMPLANT
COVER MAYO STAND STRL (DRAPES) ×3 IMPLANT
COVER SURGICAL LIGHT HANDLE (MISCELLANEOUS) ×3 IMPLANT
DRAPE LAPAROSCOPIC ABDOMINAL (DRAPES) ×3 IMPLANT
DRAPE UTILITY XL STRL (DRAPES) ×3 IMPLANT
DRAPE WARM FLUID 44X44 (DRAPE) ×3 IMPLANT
DRSG OPSITE POSTOP 4X10 (GAUZE/BANDAGES/DRESSINGS) IMPLANT
DRSG OPSITE POSTOP 4X6 (GAUZE/BANDAGES/DRESSINGS) IMPLANT
DRSG OPSITE POSTOP 4X8 (GAUZE/BANDAGES/DRESSINGS) IMPLANT
ELECT REM PT RETURN 9FT ADLT (ELECTROSURGICAL) ×3
ELECTRODE REM PT RTRN 9FT ADLT (ELECTROSURGICAL) ×1 IMPLANT
GAUZE SPONGE 4X4 12PLY STRL (GAUZE/BANDAGES/DRESSINGS) ×3 IMPLANT
GLOVE BIO SURGEON STRL SZ 6.5 (GLOVE) IMPLANT
GLOVE BIO SURGEONS STRL SZ 6.5 (GLOVE)
GLOVE BIOGEL PI IND STRL 7.0 (GLOVE) ×6 IMPLANT
GLOVE BIOGEL PI INDICATOR 7.0 (GLOVE) ×12
GOWN STRL REUS W/TWL 2XL LVL3 (GOWN DISPOSABLE) ×3 IMPLANT
HANDLE SUCTION POOLE (INSTRUMENTS) IMPLANT
KIT BASIN OR (CUSTOM PROCEDURE TRAY) IMPLANT
MANIFOLD NEPTUNE II (INSTRUMENTS) ×3 IMPLANT
PACK COLON (CUSTOM PROCEDURE TRAY) ×3 IMPLANT
PACK GENERAL/GYN (CUSTOM PROCEDURE TRAY) IMPLANT
RTRCTR WOUND ALEXIS 18CM MED (MISCELLANEOUS) ×3
SEALER TISSUE X1 CVD JAW (INSTRUMENTS) IMPLANT
SPONGE LAP 18X18 X RAY DECT (DISPOSABLE) IMPLANT
STAPLER VISISTAT 35W (STAPLE) ×3 IMPLANT
SUCTION POOLE HANDLE (INSTRUMENTS)
SUT NOVA 1 T20/GS 25DT (SUTURE) ×6 IMPLANT
SUT NOVA NAB DX-16 0-1 5-0 T12 (SUTURE) IMPLANT
SUT NOVA NAB GS-21 0 18 T12 DT (SUTURE) ×6 IMPLANT
SUT PDS AB 1 CTX 36 (SUTURE) IMPLANT
SUT PDS AB 1 TP1 96 (SUTURE) IMPLANT
SUT PROLENE 2 0 BLUE (SUTURE) IMPLANT
SUT SILK 2 0 (SUTURE)
SUT SILK 2 0 SH CR/8 (SUTURE) IMPLANT
SUT SILK 2-0 18XBRD TIE 12 (SUTURE) IMPLANT
SUT SILK 3 0 (SUTURE)
SUT SILK 3 0 SH CR/8 (SUTURE) IMPLANT
SUT SILK 3-0 18XBRD TIE 12 (SUTURE) IMPLANT
SUT VIC AB 2-0 SH 18 (SUTURE) ×12 IMPLANT
SUT VIC AB 2-0 SH 27 (SUTURE)
SUT VIC AB 2-0 SH 27X BRD (SUTURE) IMPLANT
SUT VIC AB 4-0 PS2 18 (SUTURE) IMPLANT
TAPE PAPER 1X10 WHT MICROPORE (GAUZE/BANDAGES/DRESSINGS) ×3 IMPLANT
TAPE PAPER 3X10 WHT MICROPORE (GAUZE/BANDAGES/DRESSINGS) ×3 IMPLANT
TOWEL OR 17X26 10 PK STRL BLUE (TOWEL DISPOSABLE) ×6 IMPLANT
TOWEL OR NON WOVEN STRL DISP B (DISPOSABLE) ×6 IMPLANT

## 2016-08-20 NOTE — Transfer of Care (Signed)
Immediate Anesthesia Transfer of Care Note  Patient: Latasha Wood  Procedure(s) Performed: Procedure(s): OPEN RELOCATION OF ILEOSTOMY (N/A)  Patient Location: PACU  Anesthesia Type:General  Level of Consciousness: awake, alert  and oriented  Airway & Oxygen Therapy: Patient Spontanous Breathing and Patient connected to face mask oxygen  Post-op Assessment: Report given to RN and Post -op Vital signs reviewed and stable  Post vital signs: Reviewed and stable  Last Vitals:  Vitals:   08/20/16 0714 08/20/16 1112  BP: 110/79 130/85  Pulse: 78   Resp: 16   Temp: 36.7 C     Last Pain:  Vitals:   08/20/16 0714  TempSrc: Oral         Complications: No apparent anesthesia complications

## 2016-08-20 NOTE — Anesthesia Procedure Notes (Signed)
Procedure Name: Intubation Performed by: Gean Maidens Pre-anesthesia Checklist: Patient identified, Emergency Drugs available, Suction available, Patient being monitored and Timeout performed Patient Re-evaluated:Patient Re-evaluated prior to inductionOxygen Delivery Method: Circle system utilized Preoxygenation: Pre-oxygenation with 100% oxygen Intubation Type: IV induction Ventilation: Mask ventilation without difficulty Laryngoscope Size: Mac and 3 Grade View: Grade III Tube type: Oral Tube size: 7.0 mm Number of attempts: 2 Airway Equipment and Method: Stylet Placement Confirmation: ETT inserted through vocal cords under direct vision,  positive ETCO2,  CO2 detector and breath sounds checked- equal and bilateral Secured at: 21 cm Tube secured with: Tape Dental Injury: Teeth and Oropharynx as per pre-operative assessment

## 2016-08-20 NOTE — Anesthesia Postprocedure Evaluation (Signed)
Anesthesia Post Note  Patient: Latasha Wood  Procedure(s) Performed: Procedure(s) (LRB): OPEN RELOCATION OF ILEOSTOMY (N/A)  Patient location during evaluation: PACU Anesthesia Type: General Level of consciousness: awake and alert and patient cooperative Pain management: pain level controlled Vital Signs Assessment: post-procedure vital signs reviewed and stable Respiratory status: spontaneous breathing and respiratory function stable Cardiovascular status: stable Anesthetic complications: no    Last Vitals:  Vitals:   08/20/16 1226 08/20/16 1330  BP: (!) 148/89 128/75  Pulse:  77  Resp: 12 16  Temp: 36.6 C 36.6 C    Last Pain:  Vitals:   08/20/16 1330  TempSrc: Axillary  PainSc:                  Dickson S

## 2016-08-20 NOTE — Progress Notes (Signed)
MD paged. Family overly concerned about lime green emesis due to color. MD stated "color doesn't matter'. Patient  treated for nausea/vomit. Will continue to monitor & assess

## 2016-08-20 NOTE — Anesthesia Preprocedure Evaluation (Signed)
Anesthesia Evaluation  Patient identified by MRN, date of birth, ID band Patient awake    Reviewed: Allergy & Precautions, H&P , NPO status , Patient's Chart, lab work & pertinent test results  Airway Mallampati: II   Neck ROM: full    Dental   Pulmonary shortness of breath,    breath sounds clear to auscultation       Cardiovascular hypertension,  Rhythm:regular Rate:Normal     Neuro/Psych Anxiety    GI/Hepatic   Endo/Other  diabetes, Type 2  Renal/GU      Musculoskeletal   Abdominal   Peds  Hematology  (+) anemia ,   Anesthesia Other Findings   Reproductive/Obstetrics                             Anesthesia Physical Anesthesia Plan  ASA: II  Anesthesia Plan: General   Post-op Pain Management:    Induction: Intravenous  Airway Management Planned: Oral ETT  Additional Equipment:   Intra-op Plan:   Post-operative Plan: Extubation in OR  Informed Consent: I have reviewed the patients History and Physical, chart, labs and discussed the procedure including the risks, benefits and alternatives for the proposed anesthesia with the patient or authorized representative who has indicated his/her understanding and acceptance.     Plan Discussed with: CRNA, Anesthesiologist and Surgeon  Anesthesia Plan Comments:         Anesthesia Quick Evaluation

## 2016-08-20 NOTE — H&P (Signed)
History of Present Illness  The patient is a 60 year old female presenting for a post-operative visit. 60 year old female status post total proctocolectomy with small bowel resection who I have followed after prolonged hospitalization for ostomy revision and fistula management.  She continues to have some pouching difficulties due to her midline wound but these are much better. She is having good ostomy output and is tolerating a diet without major difficulties. She did see the ostomy clinic down at [facility] and they have given her a regimen that has improved her skin dramatically. She is now able to keep the pouch on for 2-4 days without difficulties.   Problem List/Past Medical Leighton Ruff, MD; 04/30/6332 11:08 AM) HISTORY OF CREATION OF OSTOMY (Z87.898) PERISTOMAL DERMATITIS ASSOCIATED WITH MOISTURE (L30.8) YEAST DERMATITIS (B37.2)  Other Problems Leighton Ruff, MD; 5/45/6256 11:08 AM) Ulcerative Colitis Hypercholesterolemia High blood pressure  Past Surgical History Leighton Ruff, MD; 3/89/3734 11:08 AM) Colon Polyp Removal - Colonoscopy Hysterectomy (not due to cancer) - Partial  Diagnostic Studies History Leighton Ruff, MD; 2/87/6811 11:08 AM) Colonoscopy within last year Mammogram within last year Pap Smear >5 years ago  Allergies Marjean Donna, [place]; 06/18/2016 9:28 AM) No Known Drug Allergies 05/14/2015  Medication History Leighton Ruff, MD; 5/72/6203 11:08 AM) Ondansetron (4MG Tablet Disint, 1 (one) Tablet Oral two times daily, as needed, Taken starting 12/09/2015) Active. Lomotil (2.5-0.025MG Tablet, 1 (one) Tablet Oral qid prn, Taken starting 02/24/2016) Active. Imodium A-D (2MG Capsule, 1 (one) Capsule Oral qid prn, Taken starting 03/10/2016) Active. Flonase Allergy Relief (50MCG/ACT Suspension, 3 (three) Spray Nasal three times a week, Taken starting 03/10/2016) Active. (Apply 3-4 puffs around peristomal skin hold 5-6 inches above skin. With  ostomy pouch changes. *Use Flonase non-greasy formula.*) Remicade (100MG For Solution, Intravenous) Active. Glycopyrrolate (2MG Tablet, Oral) Active. Potassium Chloride Crys ER (20MEQ Tablet ER, Oral) Active. PredniSONE (10MG Tablet, Oral) Active. Oscal 500/200 D-3 (500-200MG-UNIT Tablet, Oral) Active. Ultram (50MG Tablet, Oral as needed) Active. Zofran (4MG Tablet, Oral as needed) Active. Lialda (1.2GM Tablet DR, Oral) Active. Benicar HCT (40-12.5MG Tablet, Oral) Active. ALPRAZolam (0.25MG Tablet, Oral) Active. Cephalexin (500MG Capsule, Oral) Active. Diphenoxylate-Atropine (2.5-0.025MG Tablet, Oral) Active. Famotidine (20MG Tablet, Oral) Active. Hydrocortisone (1% Cream, External) Active. Loperamide HCl (2MG Capsule, Oral) Active. OxyCODONE HCl (5MG/5ML Solution, Oral) Active. Medications Reconciled Nystatin (100000 UNIT/GM Powder, 1 (one) Powder External three times daily, Taken starting 06/18/2016) Active.  Social History Leighton Ruff, MD; 5/59/7416 11:08 AM) Tobacco use Never smoker. No drug use Alcohol use Occasional alcohol use. Caffeine use Carbonated beverages, Coffee, Tea.  Family History Leighton Ruff, MD; 3/84/5364 11:08 AM) Ischemic Bowel Disease Brother, Sister. Alcohol Abuse Mother. Arthritis Mother. Hypertension Brother, Father, Mother, Sister. Breast Cancer Mother, Sister. Heart Disease Father. Diabetes Mellitus Sister. Depression Mother, Sister. Colon Polyps Brother.  Pregnancy / Birth History Leighton Ruff, MD; 6/80/3212 11:08 AM) Age of menopause 11-60 Gravida 6 Irregular periods Age at menarche 85 years. Maternal age 3-20 Para 2   Review of Systems  Skin Not Present- Bruising, Dryness and Hair Loss. Respiratory Not Present- Chronic Cough, Decreased Exercise Tolerance, Difficulty Breathing and Sputum Production. Cardiovascular Present- Shortness of Breath and Swelling of Extremities. Not Present- Chest  Pain, Difficulty Breathing Lying Down, Leg Cramps, Palpitations and Rapid Heart Rate. Gastrointestinal Present- Abdominal Pain and Bloating. Not Present- Bloody Stool, Change in Bowel Habits, Chronic diarrhea, Constipation, Difficulty Swallowing, Excessive gas, Gets full quickly at meals, Hemorrhoids, Indigestion, Nausea, Rectal Pain and Vomiting. Female Genitourinary Not Present- Blood in Urine, Change In Bladder  Habits and Difficulty Emptying Bladder. Musculoskeletal Present- Joint Pain and Joint Stiffness. Not Present- Back Pain and Joint Redness. Neurological Not Present- Decreased Memory, Difficulty Speaking and Dizziness.  BP 110/79 (BP Location: Right Arm)   Pulse 78   Temp 98.1 F (36.7 C) (Oral)   Resp 16   Ht 5' 4"  (1.626 m)   Wt 78.9 kg (174 lb)   SpO2 98%   BMI 29.87 kg/m    Physical Exam   General Mental Status-Alert. General Appearance-Cooperative.  Integumentary Note: Midline wound almost completely healed. Small strip of granulation tissue noted on photos.  Chest and Lung Exam Chest and lung exam reveals -on auscultation, normal breath sounds, no adventitious sounds and normal vocal resonance.  Cardiovascular Auscultation Heart Sounds - Normal heart sounds.  Abdomen Inspection Inspection of the abdomen reveals - Note: Soft nondistended, fistula site healed, peristomal skin much improved. midline ostomy: intact   Assessment & Plan Leighton Ruff MD; 0/06/6760 10:11 AM)  HISTORY OF CREATION OF OSTOMY (P50.932) Impression: 60 year old female with a history of ulcerative colitis who underwent an emergent procedure for a necrotic ostomy and December 2016. She has been healing from this emergent surgery since then. She has a midline ostomy and has experienced quite a bit of dermatitis associated with this. I think it is time to resect this and give her a right lower quadrant ileostomy again.  Risks including bleeding, infection, hernia, damage to  adjacent structures, small bowel injury and need for small bowel resection were discussed with the patient at length and patient has agreed to proceed with surgery.

## 2016-08-20 NOTE — Op Note (Signed)
08/20/2016  10:52 AM  PATIENT:  Latasha Wood  60 y.o. female  Patient Care Team: Janith Lima, MD as PCP - General (Internal Medicine) Lafayette Dragon, MD (Inactive) (Gastroenterology) Brien Few, MD (Obstetrics and Gynecology) Kem Parkinson, MD (Ophthalmology)  PRE-OPERATIVE DIAGNOSIS:  ileostomy dysfunction  POST-OPERATIVE DIAGNOSIS:  ileostomy dysfunction  PROCEDURE:  OPEN RELOCATION OF ILEOSTOMY, LYSIS OF ADHESIONS FOR 1 HOUR   Surgeon(s): Leighton Ruff, MD Mickeal Skinner, MD  ASSISTANT: Dr Kieth Brightly   ANESTHESIA:   general  EBL: 27m Total I/O In: 2000 [I.V.:2000] Out: -   DRAINS: none   SPECIMEN:  Source of Specimen:  ileostomy  DISPOSITION OF SPECIMEN:  PATHOLOGY  COUNTS:  YES  PLAN OF CARE: Admit to inpatient   PATIENT DISPOSITION:  PACU - hemodynamically stable.  INDICATION: 60y.o. F who underwent emergent exploration and temporary ostomy placement at midline.  She is here today for reversal.   OR FINDINGS: significant pelvic adhesions  DESCRIPTION: the patient was identified in the preoperative holding area and taken to the OR where they were laid supine on the operating room table.  General anesthesia was induced without difficulty. SCDs were also noted to be in place prior to the initiation of anesthesia. Foley catheter was inserted under sterile conditions. The patient was then prepped and draped in the usual sterile fashion.   A surgical timeout was performed indicating the correct patient, procedure, positioning and need for preoperative antibiotics.  I began by making a midline incision around her previous ostomy using a 10 blade scalpel. Dissection was carried down through the subcutaneous tissues using electrocautery. The ileostomy remained intact and the subcutaneous tissue. I identified the fascia at midline and divided this with cautery. I then divided the peritoneum and was able to bluntly sweep the adherent small bowel out  of the way. The remaining peritoneum was opened. I began to mobilize her small bowel lysing adhesions as I went. I followed the terminal ileum down into the pelvis and remove the adhesions to the pelvic peritoneum using sharp dissection. Once enough terminal ileum was freed that it would mobilize to the right lower quadrant I turned my attention to freeing the small bowel from the wall of the right lower quadrant using sharp dissection. After this was completed a new ileostomy site was made in the skin using electrocautery at the previously marked spot on the skin. Dissection was carried down through subcutaneous tissues using electrocautery. The fascia was identified and incised using a cruciate pattern. The rectus muscle was split and the peritoneum was entered using electrocautery. The fascia was then stretched approximately 3 fingerbreadths to allow for the ileostomy passage. I then brought out the terminal ileum through this spot without difficulty. The pelvis was then irrigated with normal saline. The small bowel was inspected. Hemostasis was good. There was no small bowel injury. The was an area of significant dense adhesions in the patient's left lower quadrant to her abdominal wall at approximately her umbilicus. These were not taken down at this time. The fascia was then closed using #1 Novafil interrupted sutures. The subcutaneous tissue was loosely approximated using interrupted 2-0 Vicryl sutures. A sterile dressing was applied. The terminal ileum was then transected to the level of viability. The ileostomy was matured in standard Brooke fashion using 2-0 Vicryl sutures. An ostomy appliance and sterile dressing were applied. The patient was awakened from anesthesia and sent to the postanesthesia care unit in stable condition. All counts were correct per operating  room staff.

## 2016-08-21 LAB — CBC
HEMATOCRIT: 30.6 % — AB (ref 36.0–46.0)
HEMOGLOBIN: 10.2 g/dL — AB (ref 12.0–15.0)
MCH: 28.8 pg (ref 26.0–34.0)
MCHC: 33.3 g/dL (ref 30.0–36.0)
MCV: 86.4 fL (ref 78.0–100.0)
Platelets: 277 10*3/uL (ref 150–400)
RBC: 3.54 MIL/uL — AB (ref 3.87–5.11)
RDW: 13.2 % (ref 11.5–15.5)
WBC: 13.2 10*3/uL — AB (ref 4.0–10.5)

## 2016-08-21 LAB — BASIC METABOLIC PANEL
ANION GAP: 6 (ref 5–15)
BUN: 12 mg/dL (ref 6–20)
CO2: 26 mmol/L (ref 22–32)
Calcium: 8.3 mg/dL — ABNORMAL LOW (ref 8.9–10.3)
Chloride: 102 mmol/L (ref 101–111)
Creatinine, Ser: 0.97 mg/dL (ref 0.44–1.00)
GFR calc Af Amer: >60 mL/min (ref >60)
GLUCOSE: 143 mg/dL — AB (ref 65–99)
POTASSIUM: 3.6 mmol/L (ref 3.5–5.1)
Sodium: 134 mmol/L — ABNORMAL LOW (ref 135–145)

## 2016-08-21 NOTE — Progress Notes (Signed)
1 Day Post-Op Ileostomy relocation Subjective: Had a headache and some emesis last night.  Also had urinary retention and needed I&O.  Headache gone now.  Urinated this AM.  No nausea  Objective: Vital signs in last 24 hours: Temp:  [97.4 F (36.3 C)-98.5 F (36.9 C)] 98.5 F (36.9 C) (10/13 0503) Pulse Rate:  [62-86] 75 (10/13 0503) Resp:  [11-25] 15 (10/13 0503) BP: (118-148)/(67-93) 118/72 (10/13 0503) SpO2:  [93 %-100 %] 100 % (10/13 0503) Weight:  [78.9 kg (174 lb)] 78.9 kg (174 lb) (10/12 0751)   Intake/Output from previous day: 10/12 0701 - 10/13 0700 In: 3370 [P.O.:180; I.V.:3190] Out: 1305 [Urine:1150; Emesis/NG output:30; Stool:100; Blood:25] Intake/Output this shift: No intake/output data recorded.   General appearance: alert and cooperative GI: normal findings: soft, non-distended Ostomy: pink and viable with bilious output Incision: some bloody drainage present  Lab Results:   Recent Labs  08/21/16 0510  WBC 13.2*  HGB 10.2*  HCT 30.6*  PLT 277   BMET  Recent Labs  08/21/16 0510  NA 134*  K 3.6  CL 102  CO2 26  GLUCOSE 143*  BUN 12  CREATININE 0.97  CALCIUM 8.3*   PT/INR No results for input(s): LABPROT, INR in the last 72 hours. ABG No results for input(s): PHART, HCO3 in the last 72 hours.  Invalid input(s): PCO2, PO2  MEDS, Scheduled . acetaminophen  1,000 mg Oral Q6H  . alvimopan  12 mg Oral BID  . enoxaparin (LOVENOX) injection  40 mg Subcutaneous Q24H  . irbesartan  300 mg Oral Daily   And  . hydrochlorothiazide  12.5 mg Oral Daily  . pantoprazole  40 mg Oral Daily    Studies/Results: No results found.  Assessment: s/p Procedure(s): OPEN RELOCATION OF ILEOSTOMY Patient Active Problem List   Diagnosis Date Noted  . Ileostomy dysfunction ([place]) 08/20/2016  . Hearing loss associated with syndrome of both ears 08/17/2016  . Right thyroid nodule 08/17/2016  . Arthropathy in ulcerative colitis ([place]) 03/23/2016  . Visit for  screening mammogram 03/23/2016  . Necrosis of ileostomy ([place]) 10/07/2015  . Ulcerative colitis ([place]) 09/25/2015  . Type 2 diabetes mellitus with complication, without long-term current use of insulin ([place]) 09/05/2015  . Pure hyperglyceridemia 09/01/2015  . Anxiety 09/01/2015  . Pernicious anemia 05/28/2015  . Routine general medical examination at a health care facility 05/28/2015  . Hyperlipidemia LDL goal <70 04/17/2011  . Essential hypertension 12/30/2007      Plan: Clears as tolerated  Change dressing BID Ambulate   LOS: 1 day     .Rosario Adie, [facility], [place]   08/21/2016 7:30 AM

## 2016-08-21 NOTE — Consult Note (Signed)
[place] Nurse ostomy follow up  Stoma type/location: RUQ ileostomy Stomal assessment/size: 1 inch, budded (raised), pink, moist, no evidence of mucocutaneous separation. Peristomal assessment: Intact skin Treatment options for stomal/peristomal skin: NA Output:  174 ml of green effluent  Ostomy pouching: Due to sensitive skin, patient uses Coloplast one piece Sensuro Mio with filter, a product we do not stock.  With patient permission, I added a Hollister barrier ring Kellie Simmering [phone]).  The ostomy appliance had just begun to leak toward the mid-abdominal incision site.  No evidence of effluent contaminating the actual dressing, just a small area of skin and paper tape.  This area was cleansed, contaminated tape removed, and new paper tape applied to the edge closest to the ostomy.  Education provided: Patient and family member, Kia, well versed in emptying the pouch, measuring the stoma, and applying the new pouch.  All questions for both persons answered to their expressed satisfaction.  Discussed POC with patient and bedside nurse.   Thanks Val Riles MSN, RN, CNS-BC, [employer]

## 2016-08-22 LAB — CBC
HCT: 27.5 % — ABNORMAL LOW (ref 36.0–46.0)
Hemoglobin: 9.3 g/dL — ABNORMAL LOW (ref 12.0–15.0)
MCH: 28.4 pg (ref 26.0–34.0)
MCHC: 33.8 g/dL (ref 30.0–36.0)
MCV: 84.1 fL (ref 78.0–100.0)
PLATELETS: 259 10*3/uL (ref 150–400)
RBC: 3.27 MIL/uL — ABNORMAL LOW (ref 3.87–5.11)
RDW: 13.1 % (ref 11.5–15.5)
WBC: 9.2 10*3/uL (ref 4.0–10.5)

## 2016-08-22 LAB — BASIC METABOLIC PANEL
Anion gap: 5 (ref 5–15)
BUN: 7 mg/dL (ref 6–20)
CHLORIDE: 103 mmol/L (ref 101–111)
CO2: 28 mmol/L (ref 22–32)
CREATININE: 0.94 mg/dL (ref 0.44–1.00)
Calcium: 8.5 mg/dL — ABNORMAL LOW (ref 8.9–10.3)
GFR calc Af Amer: >60 mL/min (ref >60)
GFR calc non Af Amer: >60 mL/min (ref >60)
Glucose, Bld: 94 mg/dL (ref 65–99)
Potassium: 3.6 mmol/L (ref 3.5–5.1)
SODIUM: 136 mmol/L (ref 135–145)

## 2016-08-22 MED ORDER — HYDROCORTISONE 1 % EX CREA
1.0000 "application " | TOPICAL_CREAM | Freq: Four times a day (QID) | CUTANEOUS | Status: DC | PRN
  Administered 2016-08-22 – 2016-08-23 (×2): 1 via TOPICAL
  Filled 2016-08-22 (×2): qty 28

## 2016-08-22 NOTE — Progress Notes (Signed)
2 Days Post-Op  Subjective: Complains of abdominal pain and burning  Objective: Vital signs in last 24 hours: Temp:  [98.2 F (36.8 C)-99.2 F (37.3 C)] 99 F (37.2 C) (10/14 0558) Pulse Rate:  [73-88] 88 (10/14 0558) Resp:  [15-16] 16 (10/14 0558) BP: (107-143)/(61-77) 107/61 (10/14 0558) SpO2:  [96 %-100 %] 96 % (10/14 0558) Last BM Date: 08/21/16  Intake/Output from previous day: 10/13 0701 - 10/14 0700 In: 2160 [P.O.:360; I.V.:1800] Out: 4300 [Urine:3100; Stool:1200] Intake/Output this shift: No intake/output data recorded.  Resp: clear to auscultation bilaterally Cardio: regular rate and rhythm GI: soft, mild to moderate tenderness. ostomy pink with minimal output  Lab Results:   Recent Labs  08/21/16 0510 08/22/16 0531  WBC 13.2* 9.2  HGB 10.2* 9.3*  HCT 30.6* 27.5*  PLT 277 259   BMET  Recent Labs  08/21/16 0510 08/22/16 0531  NA 134* 136  K 3.6 3.6  CL 102 103  CO2 26 28  GLUCOSE 143* 94  BUN 12 7  CREATININE 0.97 0.94  CALCIUM 8.3* 8.5*   PT/INR No results for input(s): LABPROT, INR in the last 72 hours. ABG No results for input(s): PHART, HCO3 in the last 72 hours.  Invalid input(s): PCO2, PO2  Studies/Results: No results found.  Anti-infectives: Anti-infectives    Start     Dose/Rate Route Frequency Ordered Stop   08/20/16 2200  cefoTEtan (CEFOTAN) 2 g in dextrose 5 % 50 mL IVPB     2 g 100 mL/hr over 30 Minutes Intravenous Every 12 hours 08/20/16 1330 08/20/16 2234   08/20/16 0716  cefoTEtan in Dextrose 5% (CEFOTAN) IVPB 2 g     2 g Intravenous On call to O.R. 08/20/16 0716 08/20/16 0916      Assessment/Plan: s/p Procedure(s): OPEN RELOCATION OF ILEOSTOMY (N/A) advance to fulls today  Continue dressing changes ambulate  LOS: 2 days    TOTH III,PAUL S 08/22/2016

## 2016-08-23 LAB — CBC
HEMATOCRIT: 27 % — AB (ref 36.0–46.0)
HEMOGLOBIN: 9.1 g/dL — AB (ref 12.0–15.0)
MCH: 28.4 pg (ref 26.0–34.0)
MCHC: 33.7 g/dL (ref 30.0–36.0)
MCV: 84.4 fL (ref 78.0–100.0)
Platelets: 270 10*3/uL (ref 150–400)
RBC: 3.2 MIL/uL — AB (ref 3.87–5.11)
RDW: 13 % (ref 11.5–15.5)
WBC: 8.3 10*3/uL (ref 4.0–10.5)

## 2016-08-23 LAB — BASIC METABOLIC PANEL
ANION GAP: 5 (ref 5–15)
BUN: 6 mg/dL (ref 6–20)
CALCIUM: 8.3 mg/dL — AB (ref 8.9–10.3)
CO2: 27 mmol/L (ref 22–32)
Chloride: 104 mmol/L (ref 101–111)
Creatinine, Ser: 0.87 mg/dL (ref 0.44–1.00)
Glucose, Bld: 90 mg/dL (ref 65–99)
POTASSIUM: 4 mmol/L (ref 3.5–5.1)
SODIUM: 136 mmol/L (ref 135–145)

## 2016-08-23 MED ORDER — OXYCODONE-ACETAMINOPHEN 5-325 MG PO TABS
1.0000 | ORAL_TABLET | ORAL | Status: DC | PRN
  Administered 2016-08-23: 2 via ORAL
  Administered 2016-08-23: 1 via ORAL
  Administered 2016-08-23: 2 via ORAL
  Administered 2016-08-23: 1 via ORAL
  Administered 2016-08-24 (×5): 2 via ORAL
  Filled 2016-08-23: qty 2
  Filled 2016-08-23: qty 1
  Filled 2016-08-23 (×5): qty 2
  Filled 2016-08-23: qty 1
  Filled 2016-08-23: qty 2

## 2016-08-23 NOTE — Progress Notes (Signed)
Pt's IV leaking at site.  Pt stated that IV site was starting to burn and asked if nurse could remove IV.  Nurse removed IV with catheter intact.  Pt requested not to have IV restarted at this time.

## 2016-08-23 NOTE — Progress Notes (Signed)
3 Days Post-Op  Subjective: Feeling better. Complains of rash on torso  Objective: Vital signs in last 24 hours: Temp:  [98 F (36.7 C)-99.2 F (37.3 C)] 98 F (36.7 C) (10/15 0516) Pulse Rate:  [71-89] 71 (10/15 0516) Resp:  [15-16] 16 (10/15 0516) BP: (102-132)/(54-81) 111/62 (10/15 0516) SpO2:  [98 %-99 %] 99 % (10/15 0516) Last BM Date: 08/22/16  Intake/Output from previous day: 10/14 0701 - 10/15 0700 In: 2640 [P.O.:840; I.V.:1800] Out: 3850 [Urine:3150; Stool:700] Intake/Output this shift: No intake/output data recorded.  Resp: clear to auscultation bilaterally Cardio: regular rate and rhythm GI: soft, mild tenderness. ostomy pink and productive. faint raised rash on abdomen  Lab Results:   Recent Labs  08/22/16 0531 08/23/16 0429  WBC 9.2 8.3  HGB 9.3* 9.1*  HCT 27.5* 27.0*  PLT 259 270   BMET  Recent Labs  08/22/16 0531 08/23/16 0429  NA 136 136  K 3.6 4.0  CL 103 104  CO2 28 27  GLUCOSE 94 90  BUN 7 6  CREATININE 0.94 0.87  CALCIUM 8.5* 8.3*   PT/INR No results for input(s): LABPROT, INR in the last 72 hours. ABG No results for input(s): PHART, HCO3 in the last 72 hours.  Invalid input(s): PCO2, PO2  Studies/Results: No results found.  Anti-infectives: Anti-infectives    Start     Dose/Rate Route Frequency Ordered Stop   08/20/16 2200  cefoTEtan (CEFOTAN) 2 g in dextrose 5 % 50 mL IVPB     2 g 100 mL/hr over 30 Minutes Intravenous Every 12 hours 08/20/16 1330 08/20/16 2234   08/20/16 0716  cefoTEtan in Dextrose 5% (CEFOTAN) IVPB 2 g     2 g Intravenous On call to O.R. 08/20/16 0716 08/20/16 0916      Assessment/Plan: s/p Procedure(s): OPEN RELOCATION OF ILEOSTOMY (N/A) Advance diet. Start soft diet Hydrocortisone cream for rash. May be a contact dermatitis from prep Switch to oral pain meds ambulate  LOS: 3 days    Wood III,Latasha Shur S 08/23/2016

## 2016-08-24 MED ORDER — FAMOTIDINE 20 MG PO TABS: 20.0000 mg | ORAL_TABLET | Freq: Two times a day (BID) | ORAL | Status: DC | PRN

## 2016-08-24 MED ORDER — DEXTROSE IN LACTATED RINGERS 5 % IV SOLN
INTRAVENOUS | Status: DC
  Administered 2016-08-24: 1000 mL via INTRAVENOUS
  Administered 2016-08-25 – 2016-08-28 (×4): via INTRAVENOUS

## 2016-08-24 MED ORDER — LOPERAMIDE HCL 2 MG PO CAPS: 2.0000 mg | ORAL_CAPSULE | Freq: Three times a day (TID) | ORAL | 3 refills | Status: DC | PRN

## 2016-08-24 NOTE — Discharge Instructions (Signed)
ABDOMINAL SURGERY: POST OP INSTRUCTIONS  1. DIET: Follow a light bland diet the first 24 hours after arrival home, such as soup, liquids, crackers, etc.  Be sure to include lots of fluids daily.  Avoid fast food or heavy meals as your are more likely to get nauseated.  Eat a low fat the next few days after surgery.   2. Take your usually prescribed home medications unless otherwise directed. 3. PAIN CONTROL: a. Pain is best controlled by a usual combination of three different methods TOGETHER: i. Ice/Heat ii. Over the counter pain medication iii. Prescription pain medication b. Most patients will experience some swelling and bruising around the incisions.  Ice packs or heating pads (30-60 minutes up to 6 times a day) will help. Use ice for the first few days to help decrease swelling and bruising, then switch to heat to help relax tight/sore spots and speed recovery.  Some people prefer to use ice alone, heat alone, alternating between ice & heat.  Experiment to what works for you.  Swelling and bruising can take several weeks to resolve.   c. It is helpful to take an over-the-counter pain medication regularly for the first few weeks.  Choose one of the following that works best for you: i. Naproxen (Aleve, etc)  Two 279m tabs twice a day ii. Ibuprofen (Advil, etc) Three 2043mtabs four times a day (every meal & bedtime) iii. Acetaminophen (Tylenol, etc) 500-65020mour times a day (every meal & bedtime) d. A  prescription for pain medication (such as oxycodone, hydrocodone, etc) should be given to you upon discharge.  Take your pain medication as prescribed.  i. If you are having problems/concerns with the prescription medicine (does not control pain, nausea, vomiting, rash, itching, etc), please call us [phone] see if we need to switch you to a different pain medicine that will work better for you and/or control your side effect better. ii. If you need a refill on your pain medication,  please contact your pharmacy.  They will contact our office to request authorization. Prescriptions will not be filled after 5 pm or on week-ends. 4. Watch out for diarrhea.  If you have many loose bowel movements, simplify your diet to bland foods & liquids for a few days. Switching to an anti-diarrheal medications (Kayopectate, Pepto Bismol, Imodium) can help.  If this worsens or does not improve, please call us.[place]. Wash / shower every day.  You may shower over the incision / wound.  Avoid baths until the skin is fully healed.  Continue to shower over incision(s) after the dressing is off. 6. Remove your dressing daily.  Clean wound and replace with a dry dressing.  Continue until there is no more drainage from your incision. 7. ACTIVITIES as tolerated:   a. You may resume regular (light) daily activities beginning the next day--such as daily self-care, walking, climbing stairs--gradually increasing activities as tolerated.  If you can walk 30 minutes without difficulty, it is safe to try more intense activity such as jogging, treadmill, bicycling, low-impact aerobics, swimming, etc. b. Save the most intensive and strenuous activity for last such as sit-ups, heavy lifting, contact sports, etc  Refrain from any heavy lifting or straining until you are off narcotics for pain control.   c. DO NOT PUSH THROUGH PAIN.  Let pain be your guide: If it hurts to do something, don't do it.  Pain is your body warning you to avoid that activity for another week until the  pain goes down. d. You may drive when you are no longer taking prescription pain medication, you can comfortably wear a seatbelt, and you can safely maneuver your car and apply brakes. e. Dennis Bast may have sexual intercourse when it is comfortable.  8. FOLLOW UP in our office a. Please call [facility] at [phone] to set up an appointment to see your surgeon in the office for a follow-up appointment approximately 1-2 weeks after your surgery. b. Make sure  that you call for this appointment the day you arrive home to insure a convenient appointment time. 10. IF YOU HAVE DISABILITY OR FAMILY LEAVE FORMS, BRING THEM TO THE OFFICE FOR PROCESSING.  DO NOT GIVE THEM TO YOUR DOCTOR.   WHEN TO CALL us [phone]: 1. Poor pain control 2. Reactions / problems with new medications (rash/itching, nausea, etc)  3. Fever over 101.5 F (38.5 C) 4. Inability to urinate 5. Nausea and/or vomiting 6. Worsening swelling or bruising 7. Continued bleeding from incision. 8. Increased pain, redness, or drainage from the incision  The clinic staff is available to answer your questions during regular business hours (8:30am-5pm).  Please dont hesitate to call and ask to speak to one of our nurses for clinical concerns.   A surgeon from [facility] is always on call at the hospitals   If you have a medical emergency, go to the nearest emergency room or call 911.    [facility], [place], [address] [place], [place], [place]  [address] ? MAIN: [phone] ? TOLL FREE: [phone] ? FAX [phone] [email or web address]

## 2016-08-24 NOTE — Progress Notes (Signed)
4 Days Post-Op Ileostomy relocation Subjective: Doing well.  Not eating much.  Pain controlled with percocet  Objective: Vital signs in last 24 hours: Temp:  [97.9 F (36.6 C)-98.8 F (37.1 C)] 97.9 F (36.6 C) (10/16 0500) Pulse Rate:  [69-74] 69 (10/16 0500) Resp:  [16] 16 (10/16 0500) BP: (120-129)/(68-77) 124/77 (10/16 0500) SpO2:  [97 %-100 %] 97 % (10/16 0500)   Intake/Output from previous day: 10/15 0701 - 10/16 0700 In: 420 [P.O.:120; I.V.:300] Out: 2000 [Urine:1500; Stool:500] Intake/Output this shift: Total I/O In: -  Out: 250 [Urine:200; Stool:50]   General appearance: alert and cooperative GI: normal findings: soft, non-distended Ostomy: pink and viable with bilious output Incision: some bloody drainage present, wicks removed  Lab Results:   Recent Labs  08/22/16 0531 08/23/16 0429  WBC 9.2 8.3  HGB 9.3* 9.1*  HCT 27.5* 27.0*  PLT 259 270   BMET  Recent Labs  08/22/16 0531 08/23/16 0429  NA 136 136  K 3.6 4.0  CL 103 104  CO2 28 27  GLUCOSE 94 90  BUN 7 6  CREATININE 0.94 0.87  CALCIUM 8.5* 8.3*   PT/INR No results for input(s): LABPROT, INR in the last 72 hours. ABG No results for input(s): PHART, HCO3 in the last 72 hours.  Invalid input(s): PCO2, PO2  MEDS, Scheduled . enoxaparin (LOVENOX) injection  40 mg Subcutaneous Q24H  . irbesartan  300 mg Oral Daily   And  . hydrochlorothiazide  12.5 mg Oral Daily  . pantoprazole  40 mg Oral Daily    Studies/Results: No results found.  Assessment: s/p Procedure(s): OPEN RELOCATION OF ILEOSTOMY Patient Active Problem List   Diagnosis Date Noted  . Ileostomy dysfunction ([place]) 08/20/2016  . Hearing loss associated with syndrome of both ears 08/17/2016  . Right thyroid nodule 08/17/2016  . Arthropathy in ulcerative colitis ([place]) 03/23/2016  . Visit for screening mammogram 03/23/2016  . Necrosis of ileostomy ([place]) 10/07/2015  . Ulcerative colitis ([place]) 09/25/2015  . Type 2  diabetes mellitus with complication, without long-term current use of insulin ([place]) 09/05/2015  . Pure hyperglyceridemia 09/01/2015  . Anxiety 09/01/2015  . Pernicious anemia 05/28/2015  . Routine general medical examination at a health care facility 05/28/2015  . Hyperlipidemia LDL goal <70 04/17/2011  . Essential hypertension 12/30/2007      Plan: Cont soft diet Encourage more PO intake today Wound/ostomy teaching today Ambulate Anticipate d/c in AM    LOS: 4 days     .Rosario Adie, [facility], [place]   08/24/2016 8:35 AM

## 2016-08-24 NOTE — Progress Notes (Signed)
Patient in the bathroom complain of continuous pain on her abdomen states " its hard I'm having a blockage , I'm having a blockage" . Paged and notified on call provider Dr. Excell Seltzer ordered to Iv fluids and just sips of fluids for tonight. We will continue to monitor and assess.

## 2016-08-24 NOTE — Progress Notes (Signed)
Ileostomy output minimal since last evening.  Only 100cc output today.  Pt has been complaining of more abdominal discomfort this afternoon.  Encouraged to walk as much as possible and increase fluid intake.  Abdomen firm, but unchanged from this morning. Dr. Excell Seltzer notified, will continue to monitor.

## 2016-08-24 NOTE — Consult Note (Signed)
[facility] Nurse ostomy consult note Stoma type/location: RUQ ileostomy Stomal assessment/size: Slightly larger than 1 and 1/4 inch round, budded, moist.  System cut to 1 and 3/8 inches.  Skin barrier ring placed close to stoma edge/suture line Peristomal assessment: Intact, clear Treatment options for stomal/peristomal skin: flattened skin barrier ring Output:  None today since 8am at which time the effluent was pasty. Ostomy pouching: 1pc. Coloplast Mio pouch.  Education provided: Patient and husband in room for pouch change.  Both are pleased with stoma presentation and concerned about cramping abdominal pain and no output today despite walking and eating (a little) soft diet. Using heat packs on abdomen and massage.  Will receive medication (percocet) and walk again shortly. Minor concern for dehydration.  I have asked Bedside RN Jeannene Patella) to notify [facility].  I will see in am. Enrolled patient in [place] program: No [place] nursing team will follow, and will remain available to this patient, the nursing, surgical and medical teams. Thanks, Maudie Flakes, MSN, RN, [place], Arther Abbott  Pager# [phone]

## 2016-08-25 ENCOUNTER — Inpatient Hospital Stay (HOSPITAL_COMMUNITY): Payer: 59

## 2016-08-25 MED ORDER — ACETAMINOPHEN 10 MG/ML IV SOLN
1000.0000 mg | Freq: Four times a day (QID) | INTRAVENOUS | Status: AC
  Administered 2016-08-25 – 2016-08-26 (×4): 1000 mg via INTRAVENOUS
  Filled 2016-08-25 (×5): qty 100

## 2016-08-25 NOTE — Progress Notes (Signed)
Dr. Karie Mainland you call Maudie Flakes in the morning for irrigation of ileostomy. She prefers you to be present and needs an order. Thanks.

## 2016-08-25 NOTE — Progress Notes (Signed)
5 Days Post-Op Ileostomy relocation Subjective: Had an episode of pain, nausea and vomiting last night.  No ileostomy output since then  Objective: Vital signs in last 24 hours: Temp:  [97.7 F (36.5 C)-98.9 F (37.2 C)] 97.7 F (36.5 C) (10/17 0600) Pulse Rate:  [69-75] 74 (10/17 0600) Resp:  [16] 16 (10/17 0600) BP: (123-157)/(76-86) 123/78 (10/17 0600) SpO2:  [100 %] 100 % (10/17 0600)   Intake/Output from previous day: 10/16 0701 - 10/17 0700 In: 860 [P.O.:90; I.V.:770] Out: 2200 [Urine:2100; Stool:100] Intake/Output this shift: No intake/output data recorded.   General appearance: alert and cooperative GI: normal findings: soft, non-distended Ostomy: pink and viable with no output Incision: min drainage present  Lab Results:   Recent Labs  08/23/16 0429  WBC 8.3  HGB 9.1*  HCT 27.0*  PLT 270   BMET  Recent Labs  08/23/16 0429  NA 136  K 4.0  CL 104  CO2 27  GLUCOSE 90  BUN 6  CREATININE 0.87  CALCIUM 8.3*   PT/INR No results for input(s): LABPROT, INR in the last 72 hours. ABG No results for input(s): PHART, HCO3 in the last 72 hours.  Invalid input(s): PCO2, PO2  MEDS, Scheduled . acetaminophen  1,000 mg Intravenous Q6H  . enoxaparin (LOVENOX) injection  40 mg Subcutaneous Q24H  . irbesartan  300 mg Oral Daily   And  . hydrochlorothiazide  12.5 mg Oral Daily  . pantoprazole  40 mg Oral Daily    Studies/Results: No results found.  Assessment: s/p Procedure(s): OPEN RELOCATION OF ILEOSTOMY Patient Active Problem List   Diagnosis Date Noted  . Ileostomy dysfunction ([place]) 08/20/2016  . Hearing loss associated with syndrome of both ears 08/17/2016  . Right thyroid nodule 08/17/2016  . Arthropathy in ulcerative colitis ([place]) 03/23/2016  . Visit for screening mammogram 03/23/2016  . Necrosis of ileostomy ([place]) 10/07/2015  . Ulcerative colitis ([place]) 09/25/2015  . Type 2 diabetes mellitus with complication, without long-term current use  of insulin ([place]) 09/05/2015  . Pure hyperglyceridemia 09/01/2015  . Anxiety 09/01/2015  . Pernicious anemia 05/28/2015  . Routine general medical examination at a health care facility 05/28/2015  . Hyperlipidemia LDL goal <70 04/17/2011  . Essential hypertension 12/30/2007   Post op ileus   Plan: NPO with sips Ambulate Cont IVF's IV tylenol to help control pain without narcotics AXR     LOS: 5 days     .Latasha Wood, [facility], [place]   08/25/2016 8:33 AM

## 2016-08-26 LAB — CBC
HEMATOCRIT: 36.3 % (ref 36.0–46.0)
HEMOGLOBIN: 12.8 g/dL (ref 12.0–15.0)
MCH: 29.5 pg (ref 26.0–34.0)
MCHC: 35.3 g/dL (ref 30.0–36.0)
MCV: 83.6 fL (ref 78.0–100.0)
Platelets: 435 10*3/uL — ABNORMAL HIGH (ref 150–400)
RBC: 4.34 MIL/uL (ref 3.87–5.11)
RDW: 12.8 % (ref 11.5–15.5)
WBC: 9.5 10*3/uL (ref 4.0–10.5)

## 2016-08-26 LAB — BASIC METABOLIC PANEL
Anion gap: 7 (ref 5–15)
BUN: 6 mg/dL (ref 6–20)
CO2: 31 mmol/L (ref 22–32)
Calcium: 9.2 mg/dL (ref 8.9–10.3)
Chloride: 100 mmol/L — ABNORMAL LOW (ref 101–111)
Creatinine, Ser: 1.04 mg/dL — ABNORMAL HIGH (ref 0.44–1.00)
GFR calc Af Amer: >60 mL/min (ref >60)
GFR calc non Af Amer: 57 mL/min — ABNORMAL LOW (ref >60)
Glucose, Bld: 130 mg/dL — ABNORMAL HIGH (ref 65–99)
Potassium: 3.6 mmol/L (ref 3.5–5.1)
Sodium: 138 mmol/L (ref 135–145)

## 2016-08-26 MED ORDER — ACETAMINOPHEN 10 MG/ML IV SOLN
1000.0000 mg | Freq: Four times a day (QID) | INTRAVENOUS | Status: AC
  Administered 2016-08-26 – 2016-08-27 (×3): 1000 mg via INTRAVENOUS
  Filled 2016-08-26 (×4): qty 100

## 2016-08-26 NOTE — Progress Notes (Signed)
6 Days Post-Op Ileostomy relocation Subjective: Having pain, nausea and vomiting.  Still no ileostomy output  Objective: Vital signs in last 24 hours: Temp:  [98.4 F (36.9 C)-98.8 F (37.1 C)] 98.4 F (36.9 C) (10/18 0520) Pulse Rate:  [68-74] 70 (10/18 0520) Resp:  [16] 16 (10/18 0520) BP: (144-163)/(82-94) 144/82 (10/18 0520) SpO2:  [99 %-100 %] 99 % (10/18 0520)   Intake/Output from previous day: 10/17 0701 - 10/18 0700 In: 2016.3 [P.O.:120; I.V.:1596.3; IV Piggyback:300] Out: 1900 [Urine:1900] Intake/Output this shift: No intake/output data recorded.   General appearance: alert and cooperative GI: normal findings: soft, non-distended Ostomy: beefy red and viable with no output Incision: min drainage present  Lab Results:   Recent Labs  08/26/16 0443  WBC 9.5  HGB 12.8  HCT 36.3  PLT 435*   BMET  Recent Labs  08/26/16 0443  NA 138  K 3.6  CL 100*  CO2 31  GLUCOSE 130*  BUN 6  CREATININE 1.04*  CALCIUM 9.2   PT/INR No results for input(s): LABPROT, INR in the last 72 hours. ABG No results for input(s): PHART, HCO3 in the last 72 hours.  Invalid input(s): PCO2, PO2  MEDS, Scheduled . enoxaparin (LOVENOX) injection  40 mg Subcutaneous Q24H  . irbesartan  300 mg Oral Daily   And  . hydrochlorothiazide  12.5 mg Oral Daily  . pantoprazole  40 mg Oral Daily    Studies/Results: Dg Abd 2 Views  Result Date: 08/25/2016 CLINICAL DATA:  Surgery. EXAM: ABDOMEN - 2 VIEW COMPARISON:  12/11/2015. FINDINGS: Dilated loops of small bowel are noted. Colonic gas pattern is unremarkable. Stool noted in the colon. Partial small-bowel obstruction versus adynamic ileus could present this fashion. No free air. No acute bony abnormality . IMPRESSION: Dilated loops of small bowel with normal colonic gas pattern. Stool in the colon. These findings suggest partial small obstruction versus adynamic ileus. Continued follow-up exams to demonstrate resolution can be  obtained. Electronically Signed   By: Marcello Moores  Register   On: 08/25/2016 09:31    Assessment: s/p Procedure(s): OPEN RELOCATION OF ILEOSTOMY Patient Active Problem List   Diagnosis Date Noted  . Ileostomy dysfunction ([place]) 08/20/2016  . Hearing loss associated with syndrome of both ears 08/17/2016  . Right thyroid nodule 08/17/2016  . Arthropathy in ulcerative colitis ([place]) 03/23/2016  . Visit for screening mammogram 03/23/2016  . Necrosis of ileostomy ([place]) 10/07/2015  . Ulcerative colitis ([place]) 09/25/2015  . Type 2 diabetes mellitus with complication, without long-term current use of insulin ([place]) 09/05/2015  . Pure hyperglyceridemia 09/01/2015  . Anxiety 09/01/2015  . Pernicious anemia 05/28/2015  . Routine general medical examination at a health care facility 05/28/2015  . Hyperlipidemia LDL goal <70 04/17/2011  . Essential hypertension 12/30/2007   Appears to have distal obstruction of stool at ileostomy on AXR   Plan: NPO, try to insert NG Ostomy irrigated with ~178m saline, red rubber left in place Labs look ok Ambulate Cont IVF's IV tylenol to help control pain without narcotics      LOS: 6 days     .ARosario Adie MD [facility], [place]  08/26/2016 8:49 AM

## 2016-08-26 NOTE — Progress Notes (Signed)
On call MD paged in regards to patient request. Per MD patient will need to follow up with primary surgeon in AM.

## 2016-08-26 NOTE — Progress Notes (Signed)
MD paged in regards to patient request for stool softener. Waiting call back.

## 2016-08-26 NOTE — Progress Notes (Signed)
Patient requested advice on stool softener and IV medications while on NGT. MD called. Waiting call back.

## 2016-08-26 NOTE — Consult Note (Signed)
[facility] Nurse ostomy follow up Stoma type/location: RUQ ileostomy, obstructed.  Dr. Marcello Moores in this morning to perform gentle lavage with 87m water.  No return. Stomal assessment/size: slightly larger than 1 and 1/4 inches, round, red, budded.  OS at center Peristomal assessment: intact Treatment options for stomal/peristomal skin: clear Output insignificant, bowel sweat only Ostomy pouching: 1pc.Coloplast Sensura Mio with skin barrier ring Education provided: None today. Support provided. [place] in [place] Yes [place] will follow, and will remain available to this patient, the nursing, surgical and medical teams.   Thanks, LMaudie Flakes MSN, RN, GM[place] CArther Abbott Pager# [phone]

## 2016-08-26 NOTE — Progress Notes (Signed)
Per MD order and instruction, Initial try on right nare unsuccessful and tolerated poorly. 14 fr. NG tube placed in left nare at 1100.   Patient tolerated procedure fair. Patient was given medication, Xanax .34m, to ease anxiety for procedure. Placement verified by auscultation. Secondary verification by charge nurse.

## 2016-08-27 ENCOUNTER — Inpatient Hospital Stay (HOSPITAL_COMMUNITY): Payer: 59

## 2016-08-27 MED ORDER — METOPROLOL TARTRATE 5 MG/5ML IV SOLN
5.0000 mg | Freq: Four times a day (QID) | INTRAVENOUS | Status: DC | PRN
  Administered 2016-08-27 – 2016-08-31 (×8): 5 mg via INTRAVENOUS
  Filled 2016-08-27 (×8): qty 5

## 2016-08-27 MED ORDER — ACETAMINOPHEN 10 MG/ML IV SOLN
1000.0000 mg | Freq: Four times a day (QID) | INTRAVENOUS | Status: AC
Start: 2016-08-27 — End: 2016-08-28
  Administered 2016-08-27 – 2016-08-28 (×4): 1000 mg via INTRAVENOUS
  Filled 2016-08-27 (×4): qty 100

## 2016-08-27 NOTE — Progress Notes (Signed)
7 Days Post-Op Ileostomy relocation Subjective: Having less pain,NG functioning well.  no ileostomy output  Objective: Vital signs in last 24 hours: Temp:  [97.8 F (36.6 C)-98.4 F (36.9 C)] 98.2 F (36.8 C) (10/19 0538) Pulse Rate:  [72-79] 77 (10/19 0538) Resp:  [14-15] 14 (10/19 0538) BP: (158-171)/(86-94) 162/94 (10/19 0538) SpO2:  [98 %-99 %] 99 % (10/19 0538)   Intake/Output from previous day: 10/18 0701 - 10/19 0700 In: 2277.5 [I.V.:2067.5; NG/GT:110; IV Piggyback:100] Out: 63 [Urine:1600; Emesis/NG output:700; Stool:300] Intake/Output this shift: Total I/O In: 0  Out: 450 [Urine:400; Emesis/NG output:50]   General appearance: alert and cooperative GI: normal findings: soft, non-distended Ostomy: beefy red and viable with no bilious output Incision: min drainage present  Lab Results:   Recent Labs  08/26/16 0443  WBC 9.5  HGB 12.8  HCT 36.3  PLT 435*   BMET  Recent Labs  08/26/16 0443  NA 138  K 3.6  CL 100*  CO2 31  GLUCOSE 130*  BUN 6  CREATININE 1.04*  CALCIUM 9.2   PT/INR No results for input(s): LABPROT, INR in the last 72 hours. ABG No results for input(s): PHART, HCO3 in the last 72 hours.  Invalid input(s): PCO2, PO2  MEDS, Scheduled . acetaminophen  1,000 mg Intravenous Q6H  . enoxaparin (LOVENOX) injection  40 mg Subcutaneous Q24H  . irbesartan  300 mg Oral Daily   And  . hydrochlorothiazide  12.5 mg Oral Daily  . pantoprazole  40 mg Oral Daily    Studies/Results: No results found.  Assessment: s/p Procedure(s): OPEN RELOCATION OF ILEOSTOMY Patient Active Problem List   Diagnosis Date Noted  . Ileostomy dysfunction ([place]) 08/20/2016  . Hearing loss associated with syndrome of both ears 08/17/2016  . Right thyroid nodule 08/17/2016  . Arthropathy in ulcerative colitis ([place]) 03/23/2016  . Visit for screening mammogram 03/23/2016  . Necrosis of ileostomy ([place]) 10/07/2015  . Ulcerative colitis ([place]) 09/25/2015  .  Type 2 diabetes mellitus with complication, without long-term current use of insulin ([place]) 09/05/2015  . Pure hyperglyceridemia 09/01/2015  . Anxiety 09/01/2015  . Pernicious anemia 05/28/2015  . Routine general medical examination at a health care facility 05/28/2015  . Hyperlipidemia LDL goal <70 04/17/2011  . Essential hypertension 12/30/2007   Appears to have distal obstruction of stool at ileostomy on AXR   Plan: NPO, cont NG, IV fluids Red rubber migrated out some, replaced Will get AXR today to eval for progress Cont to ambulate IV tylenol to help control pain without narcotics Metoprolol IV to help control htn      LOS: 7 days     .Rosario Adie, [facility], [place] [phone]   08/27/2016 9:38 AM

## 2016-08-27 NOTE — Consult Note (Signed)
[facility] Nurse ostomy follow up Stoma type/location: RUQ Obstructed colostomy with red rubber catheter in place.  Blood tinged brown liquid noted in pouch and emptied today.  Stomal assessment/size: no tassessed Peristomal assessment: pouch intact. Changed yesterday.  Using her own supplies from home.  Surveyor, minerals) Treatment options for stomal/peristomal skin: 1 piece Sensura Mio with barrier ring  Output blood tinged brown liquid Ostomy pouching: 1pc.  Education provided: patient is independent in self care.  Will assist as needed with pouch change due to catheter.   Enrolled patient in [place] Start Discharge program: No [facility] team will follow.  Domenic Moras RN BSN [place] Pager [phone]

## 2016-08-28 ENCOUNTER — Inpatient Hospital Stay (HOSPITAL_COMMUNITY): Payer: 59

## 2016-08-28 MED ORDER — ACETAMINOPHEN 10 MG/ML IV SOLN
1000.0000 mg | Freq: Four times a day (QID) | INTRAVENOUS | Status: AC
  Administered 2016-08-28 – 2016-08-29 (×4): 1000 mg via INTRAVENOUS
  Filled 2016-08-28 (×4): qty 100

## 2016-08-28 MED ORDER — IOPAMIDOL (ISOVUE-300) INJECTION 61%
30.0000 mL | Freq: Once | INTRAVENOUS | Status: AC | PRN
  Administered 2016-08-28: 30 mL via ORAL

## 2016-08-28 MED ORDER — IOPAMIDOL (ISOVUE-300) INJECTION 61%
100.0000 mL | Freq: Once | INTRAVENOUS | Status: AC | PRN
  Administered 2016-08-28: 100 mL via INTRAVENOUS

## 2016-08-28 NOTE — Progress Notes (Signed)
8 Days Post-Op Ileostomy relocation Subjective: Having less pain, NG not functioning well.  Has some ileostomy output  Objective: Vital signs in last 24 hours: Temp:  [98.3 F (36.8 C)-99 F (37.2 C)] 98.3 F (36.8 C) (10/20 0521) Pulse Rate:  [66-77] 74 (10/20 0521) Resp:  [15-16] 16 (10/20 0521) BP: (142-166)/(84-95) 142/85 (10/20 0521) SpO2:  [98 %-100 %] 100 % (10/20 0521)   Intake/Output from previous day: 10/19 0701 - 10/20 0700 In: 1710 [I.V.:1500; NG/GT:110; IV Piggyback:100] Out: 2860 [Urine:1750; Emesis/NG output:900; Stool:210] Intake/Output this shift: No intake/output data recorded.   General appearance: alert and cooperative GI: normal findings: soft, non-distended Ostomy: beefy red and viable with some stool Incision: min drainage present  Lab Results:   Recent Labs  08/26/16 0443  WBC 9.5  HGB 12.8  HCT 36.3  PLT 435*   BMET  Recent Labs  08/26/16 0443  NA 138  K 3.6  CL 100*  CO2 31  GLUCOSE 130*  BUN 6  CREATININE 1.04*  CALCIUM 9.2   PT/INR No results for input(s): LABPROT, INR in the last 72 hours. ABG No results for input(s): PHART, HCO3 in the last 72 hours.  Invalid input(s): PCO2, PO2  MEDS, Scheduled . enoxaparin (LOVENOX) injection  40 mg Subcutaneous Q24H  . irbesartan  300 mg Oral Daily   And  . hydrochlorothiazide  12.5 mg Oral Daily  . pantoprazole  40 mg Oral Daily    Studies/Results: Dg Abd 2 Views  Result Date: 08/27/2016 CLINICAL DATA:  Seven days postoperative ileostomy re- location. Nasogastric tube placement EXAM: ABDOMEN - 2 VIEW COMPARISON:  August 25, 2016 FINDINGS: Nasogastric tube tip and side port are in the distal stomach. There are multiple loops of dilated small bowel with multiple air-fluid levels. No free air. Catheter is placed in the right lower quadrant with the tip of the catheter overlying the mid right sacrum. Lung bases are clear. IMPRESSION: Nasogastric tube tip and side port in stomach.  Bowel dilatation or air-fluid levels consistent with either a degree of bowel obstruction or fairly significant ileus. No free air evident. Lung bases clear. Electronically Signed   By: Lowella Grip III M.D.   On: 08/27/2016 10:57    Assessment: s/p Procedure(s): OPEN RELOCATION OF ILEOSTOMY Patient Active Problem List   Diagnosis Date Noted  . Ileostomy dysfunction ([place]) 08/20/2016  . Hearing loss associated with syndrome of both ears 08/17/2016  . Right thyroid nodule 08/17/2016  . Arthropathy in ulcerative colitis ([place]) 03/23/2016  . Visit for screening mammogram 03/23/2016  . Necrosis of ileostomy ([place]) 10/07/2015  . Ulcerative colitis ([place]) 09/25/2015  . Type 2 diabetes mellitus with complication, without long-term current use of insulin ([place]) 09/05/2015  . Pure hyperglyceridemia 09/01/2015  . Anxiety 09/01/2015  . Pernicious anemia 05/28/2015  . Routine general medical examination at a health care facility 05/28/2015  . Hyperlipidemia LDL goal <70 04/17/2011  . Essential hypertension 12/30/2007   Appears to have distal obstruction of stool at ileostomy on AXR   Plan: NPO, cont NG, IV fluids Removed red rubber Will get CT to further eval her abd Cont to ambulate IV tylenol to help control pain without narcotics Metoprolol IV to help control htn      LOS: 8 days     .Rosario Adie, MD [facility], [place]   08/28/2016 8:15 AM

## 2016-08-29 LAB — BASIC METABOLIC PANEL
ANION GAP: 9 (ref 5–15)
BUN: 8 mg/dL (ref 6–20)
CHLORIDE: 99 mmol/L — AB (ref 101–111)
CO2: 27 mmol/L (ref 22–32)
Calcium: 8.5 mg/dL — ABNORMAL LOW (ref 8.9–10.3)
Creatinine, Ser: 0.94 mg/dL (ref 0.44–1.00)
GFR calc non Af Amer: >60 mL/min (ref >60)
Glucose, Bld: 103 mg/dL — ABNORMAL HIGH (ref 65–99)
Potassium: 2.7 mmol/L — CL (ref 3.5–5.1)
Sodium: 135 mmol/L (ref 135–145)

## 2016-08-29 MED ORDER — POTASSIUM CHLORIDE 10 MEQ/100ML IV SOLN
10.0000 meq | INTRAVENOUS | Status: AC
  Administered 2016-08-29 (×5): 10 meq via INTRAVENOUS
  Filled 2016-08-29 (×4): qty 100

## 2016-08-29 MED ORDER — POTASSIUM CHLORIDE 10 MEQ/100ML IV SOLN
10.0000 meq | Freq: Once | INTRAVENOUS | Status: AC
  Administered 2016-08-29: 10 meq via INTRAVENOUS
  Filled 2016-08-29: qty 100

## 2016-08-29 MED ORDER — HYDROCHLOROTHIAZIDE 10 MG/ML ORAL SUSPENSION
12.5000 mg | Freq: Every day | ORAL | Status: DC
  Administered 2016-08-29 – 2016-09-03 (×5): 13 mg via ORAL
  Filled 2016-08-29 (×11): qty 1.88

## 2016-08-29 MED ORDER — PANTOPRAZOLE SODIUM 40 MG PO PACK
40.0000 mg | PACK | Freq: Every day | ORAL | Status: DC
  Administered 2016-08-29 – 2016-09-02 (×4): 40 mg via ORAL
  Filled 2016-08-29 (×7): qty 20

## 2016-08-29 NOTE — Progress Notes (Signed)
Potassium level 2.7 mmol/L called to Dr Romana Juniper. Orders noted.

## 2016-08-29 NOTE — Progress Notes (Signed)
9 Days Post-Op  Subjective: Feels some better today. Had a large amount of ileostomy output overnight. Denies abdominal pain.  Objective: Vital signs in last 24 hours: Temp:  [98.7 F (37.1 C)-99.3 F (37.4 C)] 98.9 F (37.2 C) (10/21 0515) Pulse Rate:  [72-76] 72 (10/21 0515) Resp:  [16-18] 18 (10/21 0515) BP: (153-161)/(78-90) 156/89 (10/21 0515) SpO2:  [98 %-99 %] 98 % (10/21 0515) Last BM Date: 08/28/16  Intake/Output from previous day: 10/20 0701 - 10/21 0700 In: 2401.3 [I.V.:2101.3; IV Piggyback:300] Out: 2050 [Urine:150; Emesis/NG output:900; Stool:1000] Intake/Output this shift: No intake/output data recorded.  General appearance: alert, cooperative and no distress GI: normal findings: soft, non-tender and Mildly distended Incision/Wound: Clean and dry  Lab Results:  No results for input(s): WBC, HGB, HCT, PLT in the last 72 hours. BMET No results for input(s): NA, K, CL, CO2, GLUCOSE, BUN, CREATININE, CALCIUM in the last 72 hours.   Studies/Results: Ct Abdomen Pelvis W Contrast  Result Date: 08/28/2016 CLINICAL DATA:  60 year old female inpatient with a history of ulcerative colitis status post total proctocolectomy with ileostomy 09/26/2015 requiring ileostomy revision 10/08/2015, now postoperative day 8 status post open relocation of ileostomy on 08/20/2016, with small bowel obstruction on radiographs. EXAM: CT ABDOMEN AND PELVIS WITH CONTRAST TECHNIQUE: Multidetector CT imaging of the abdomen and pelvis was performed using the standard protocol following bolus administration of intravenous contrast. CONTRAST:  171m ISOVUE-300 IOPAMIDOL (ISOVUE-300) INJECTION 61%, 351mISOVUE-300 IOPAMIDOL (ISOVUE-300) INJECTION 61% COMPARISON:  08/27/2016 abdominal radiographs. 05/17/2015 CT abdomen/ pelvis. FINDINGS: Lower chest: Mild dependent atelectasis in both lower lobe bases. Hepatobiliary: Normal liver size. Hypodense 0.4 cm left liver lobe lesion is too small to characterize  and stable since 05/17/2015, considered benign. No additional liver lesions. Normal gallbladder with no radiopaque cholelithiasis. No biliary ductal dilatation. Pancreas: Mild diffuse main pancreatic duct dilatation up to 3 mm diameter, stable since 05/17/2015. A low-attenuation 1.1 x 0.9 cm structure at the anterior pancreatic tail (series 2/ image 22) appears new since 05/17/2015. Spleen: Normal size. No mass. Adrenals/Urinary Tract: Normal adrenals. No hydronephrosis. Simple 1.4 cm renal cyst in the posterior interpolar right kidney. Normal bladder. Stomach/Bowel: Collapsed and grossly normal stomach. Enteric tube terminates in the descending duodenum. The patient is status post total proctocolectomy and end ileostomy in the right ventral abdominal wall. There is mild-to-moderate dilatation of the fluid-filled small bowel to the level of the right lower quadrant, with a focal small bowel caliber transition noted in the right lower quadrant (series 2/ image 53), beyond which the small bowel is collapsed. Enteroenterostomy is noted in the mid pelvic small bowel. There is expected subcutaneous fat stranding surrounding the end ileostomy. No definite small bowel wall thickening. No pneumatosis. Vascular/Lymphatic: Atherosclerotic nonaneurysmal abdominal aorta. Patent portal, splenic, hepatic and renal veins. No pathologically enlarged lymph nodes in the abdomen or pelvis. Reproductive: Status post hysterectomy, with no abnormal findings at the vaginal cuff. No adnexal mass. Other: No pneumoperitoneum. Small volume ascites, with minimal layering debris in the pelvic ascites. Musculoskeletal: No aggressive appearing focal osseous lesions. Mild thoracolumbar spondylosis. Expected subcutaneous fat stranding and minimal subcutaneous emphysema in the midline ventral laparotomy site. IMPRESSION: 1. CT findings are suggestive of a distal mechanical small bowel obstruction with a transition point in the right lower quadrant,  probably due to adhesions. No pneumatosis or pneumoperitoneum. 2. Small volume ascites. 3. Subcutaneous fat stranding surrounding the end ileostomy is probably due to expected recent postsurgical change. 4. Low-attenuation 1.1 cm focus at the anterior pancreatic  tail, new since 05/17/2015, possibly fluid within the lesser sac. Recommend attention on a follow-up MRI of the abdomen without and with IV contrast in 12 months. This recommendation follows ACR consensus guidelines: Management of Incidental Pancreatic Cysts: A White Paper of the ACR Incidental Findings Committee. J Am Coll Radiol 8377;93:968-864. 5. Aortic atherosclerosis. Electronically Signed   By: Ilona Sorrel M.D.   On: 08/28/2016 16:14   Dg Abd 2 Views  Result Date: 08/27/2016 CLINICAL DATA:  Seven days postoperative ileostomy re- location. Nasogastric tube placement EXAM: ABDOMEN - 2 VIEW COMPARISON:  August 25, 2016 FINDINGS: Nasogastric tube tip and side port are in the distal stomach. There are multiple loops of dilated small bowel with multiple air-fluid levels. No free air. Catheter is placed in the right lower quadrant with the tip of the catheter overlying the mid right sacrum. Lung bases are clear. IMPRESSION: Nasogastric tube tip and side port in stomach. Bowel dilatation or air-fluid levels consistent with either a degree of bowel obstruction or fairly significant ileus. No free air evident. Lung bases clear. Electronically Signed   By: Lowella Grip III M.D.   On: 08/27/2016 10:57    Anti-infectives: Anti-infectives    Start     Dose/Rate Route Frequency Ordered Stop   08/20/16 2200  cefoTEtan (CEFOTAN) 2 g in dextrose 5 % 50 mL IVPB     2 g 100 mL/hr over 30 Minutes Intravenous Every 12 hours 08/20/16 1330 08/20/16 2234   08/20/16 0716  cefoTEtan in Dextrose 5% (CEFOTAN) IVPB 2 g     2 g Intravenous On call to O.R. 08/20/16 0716 08/20/16 0916      Assessment/Plan: s/p Procedure(s): OPEN RELOCATION OF  ILEOSTOMY Postoperative small bowel obstruction as indicated on CT scan above. Clinically seems improved today with large amount of ileostomy output. Plan to leave NG tube today and check abdominal x-rays in a.m. Check lab today. Ambulation encouraged.    LOS: 9 days    Latasha Wood T 10/21/2017Patient ID: [name], female   DOB: November 08, 1956, 60 y.o.   MRN: [id number]

## 2016-08-30 ENCOUNTER — Inpatient Hospital Stay (HOSPITAL_COMMUNITY): Payer: 59

## 2016-08-30 LAB — CBC
HCT: 30.6 % — ABNORMAL LOW (ref 36.0–46.0)
Hemoglobin: 10.3 g/dL — ABNORMAL LOW (ref 12.0–15.0)
MCH: 28.9 pg (ref 26.0–34.0)
MCHC: 33.7 g/dL (ref 30.0–36.0)
MCV: 85.7 fL (ref 78.0–100.0)
PLATELETS: 404 10*3/uL — AB (ref 150–400)
RBC: 3.57 MIL/uL — ABNORMAL LOW (ref 3.87–5.11)
RDW: 13.1 % (ref 11.5–15.5)
WBC: 10.3 10*3/uL (ref 4.0–10.5)

## 2016-08-30 LAB — BASIC METABOLIC PANEL
Anion gap: 8 (ref 5–15)
BUN: 6 mg/dL (ref 6–20)
CALCIUM: 8.5 mg/dL — AB (ref 8.9–10.3)
CO2: 26 mmol/L (ref 22–32)
CREATININE: 0.8 mg/dL (ref 0.44–1.00)
Chloride: 102 mmol/L (ref 101–111)
GFR calc non Af Amer: >60 mL/min (ref >60)
Glucose, Bld: 95 mg/dL (ref 65–99)
Potassium: 3.1 mmol/L — ABNORMAL LOW (ref 3.5–5.1)
SODIUM: 136 mmol/L (ref 135–145)

## 2016-08-30 LAB — MAGNESIUM: MAGNESIUM: 1.7 mg/dL (ref 1.7–2.4)

## 2016-08-30 LAB — PHOSPHORUS: PHOSPHORUS: 2.3 mg/dL — AB (ref 2.5–4.6)

## 2016-08-30 MED ORDER — MAGNESIUM SULFATE IN D5W 1-5 GM/100ML-% IV SOLN
1.0000 g | Freq: Once | INTRAVENOUS | Status: AC
  Administered 2016-08-30: 1 g via INTRAVENOUS
  Filled 2016-08-30: qty 100

## 2016-08-30 MED ORDER — POTASSIUM PHOSPHATES 15 MMOLE/5ML IV SOLN
15.0000 mmol | Freq: Once | INTRAVENOUS | Status: AC
  Administered 2016-08-30: 15 mmol via INTRAVENOUS
  Filled 2016-08-30: qty 5

## 2016-08-30 MED ORDER — KCL IN DEXTROSE-NACL 20-5-0.9 MEQ/L-%-% IV SOLN
INTRAVENOUS | Status: DC
  Administered 2016-08-30 – 2016-08-31 (×3): via INTRAVENOUS
  Filled 2016-08-30 (×5): qty 1000

## 2016-08-30 NOTE — Progress Notes (Signed)
Pt agreeable to PICC placement, but was concerned because of severe reaction to the tape/dressing adhesive.  Pts husband has pictures of the skin reaction.  Pt also verbalized that she has had a similar reaction to her abdomen after she was cleaned for surgery.  The appearance of the blisters in the photos appear to be more than tape reaction, questionably a reaction to the chlorhexadine as well.  Dr. Excell Seltzer notified of the concerns and the alternative for IR to place PICC using different cleanser and to suture so as to avoid extra adhesive on skin.  New orders obtained for IR to place PICC in the AM.  RN and pt and spouse notified of orders.

## 2016-08-30 NOTE — Progress Notes (Signed)
Discussed with pharmacist and Magnesium sulfate and Potassium phosphate bolus times adjusted to later time this evening per pt request until PICC line established. "I don't want to blow my IV," stated pt. IV team nurse recently arrived to start PICC line as ordered.

## 2016-08-30 NOTE — Progress Notes (Signed)
IV team unable to start PICC tonight with new orders to have placed in CT tomorrow am. After consulting with pharmacy, pt and husband reassured that recent IV boluses of magnesium sulfate and potassium phosphate were safe to give through peripheral line per pharmacist. Pt agreeable to have boluses hung as ordered.

## 2016-08-30 NOTE — Progress Notes (Signed)
10 Days Post-Op  Subjective: Some nausea and rumbling in her stomach but no severe pain. His still had significant ileostomy output. NG output decreased.  Objective: Vital signs in last 24 hours: Temp:  [98.6 F (37 C)-99.1 F (37.3 C)] 98.6 F (37 C) (10/22 0453) Pulse Rate:  [74-88] 74 (10/22 0453) Resp:  [16] 16 (10/22 0453) BP: (150-170)/(86-96) 170/86 (10/22 0453) SpO2:  [98 %-99 %] 98 % (10/22 0453) Last BM Date: 08/28/16  Intake/Output from previous day: 10/21 0701 - 10/22 0700 In: 1856.3 [I.V.:1826.3; NG/GT:30] Out: 2850 [Urine:2025; Emesis/NG output:125; Stool:700] Intake/Output this shift: No intake/output data recorded.  General appearance: alert, cooperative and no distress GI: normal findings: soft, non-tender Incision/Wound: No erythema or drainage  Lab Results:   Recent Labs  08/30/16 0019  WBC 10.3  HGB 10.3*  HCT 30.6*  PLT 404*   BMET  Recent Labs  08/29/16 0859  NA 135  K 2.7*  CL 99*  CO2 27  GLUCOSE 103*  BUN 8  CREATININE 0.94  CALCIUM 8.5*     Studies/Results: Ct Abdomen Pelvis W Contrast  Result Date: 08/28/2016 CLINICAL DATA:  60 year old female inpatient with a history of ulcerative colitis status post total proctocolectomy with ileostomy 09/26/2015 requiring ileostomy revision 10/08/2015, now postoperative day 8 status post open relocation of ileostomy on 08/20/2016, with small bowel obstruction on radiographs. EXAM: CT ABDOMEN AND PELVIS WITH CONTRAST TECHNIQUE: Multidetector CT imaging of the abdomen and pelvis was performed using the standard protocol following bolus administration of intravenous contrast. CONTRAST:  110m ISOVUE-300 IOPAMIDOL (ISOVUE-300) INJECTION 61%, 371mISOVUE-300 IOPAMIDOL (ISOVUE-300) INJECTION 61% COMPARISON:  08/27/2016 abdominal radiographs. 05/17/2015 CT abdomen/ pelvis. FINDINGS: Lower chest: Mild dependent atelectasis in both lower lobe bases. Hepatobiliary: Normal liver size. Hypodense 0.4 cm left  liver lobe lesion is too small to characterize and stable since 05/17/2015, considered benign. No additional liver lesions. Normal gallbladder with no radiopaque cholelithiasis. No biliary ductal dilatation. Pancreas: Mild diffuse main pancreatic duct dilatation up to 3 mm diameter, stable since 05/17/2015. A low-attenuation 1.1 x 0.9 cm structure at the anterior pancreatic tail (series 2/ image 22) appears new since 05/17/2015. Spleen: Normal size. No mass. Adrenals/Urinary Tract: Normal adrenals. No hydronephrosis. Simple 1.4 cm renal cyst in the posterior interpolar right kidney. Normal bladder. Stomach/Bowel: Collapsed and grossly normal stomach. Enteric tube terminates in the descending duodenum. The patient is status post total proctocolectomy and end ileostomy in the right ventral abdominal wall. There is mild-to-moderate dilatation of the fluid-filled small bowel to the level of the right lower quadrant, with a focal small bowel caliber transition noted in the right lower quadrant (series 2/ image 53), beyond which the small bowel is collapsed. Enteroenterostomy is noted in the mid pelvic small bowel. There is expected subcutaneous fat stranding surrounding the end ileostomy. No definite small bowel wall thickening. No pneumatosis. Vascular/Lymphatic: Atherosclerotic nonaneurysmal abdominal aorta. Patent portal, splenic, hepatic and renal veins. No pathologically enlarged lymph nodes in the abdomen or pelvis. Reproductive: Status post hysterectomy, with no abnormal findings at the vaginal cuff. No adnexal mass. Other: No pneumoperitoneum. Small volume ascites, with minimal layering debris in the pelvic ascites. Musculoskeletal: No aggressive appearing focal osseous lesions. Mild thoracolumbar spondylosis. Expected subcutaneous fat stranding and minimal subcutaneous emphysema in the midline ventral laparotomy site. IMPRESSION: 1. CT findings are suggestive of a distal mechanical small bowel obstruction with a  transition point in the right lower quadrant, probably due to adhesions. No pneumatosis or pneumoperitoneum. 2. Small volume ascites. 3.  Subcutaneous fat stranding surrounding the end ileostomy is probably due to expected recent postsurgical change. 4. Low-attenuation 1.1 cm focus at the anterior pancreatic tail, new since 05/17/2015, possibly fluid within the lesser sac. Recommend attention on a follow-up MRI of the abdomen without and with IV contrast in 12 months. This recommendation follows ACR consensus guidelines: Management of Incidental Pancreatic Cysts: A White Paper of the ACR Incidental Findings Committee. J Am Coll Radiol 9597;47:185-501. 5. Aortic atherosclerosis. Electronically Signed   By: Ilona Sorrel M.D.   On: 08/28/2016 16:14    Anti-infectives: Anti-infectives    Start     Dose/Rate Route Frequency Ordered Stop   08/20/16 2200  cefoTEtan (CEFOTAN) 2 g in dextrose 5 % 50 mL IVPB     2 g 100 mL/hr over 30 Minutes Intravenous Every 12 hours 08/20/16 1330 08/20/16 2234   08/20/16 0716  cefoTEtan in Dextrose 5% (CEFOTAN) IVPB 2 g     2 g Intravenous On call to O.R. 08/20/16 0716 08/20/16 0916      Assessment/Plan: s/p Procedure(s): OPEN RELOCATION OF ILEOSTOMY Hypokalemia. Recheck electrolytes this morning. Potassium added IV fluids Postoperative SBO. Clinically seems improved Repeat abdominal x-rays pending this morning.    LOS: 10 days    Latasha Wood T 10/22/2017Patient ID: Latasha Wood, female   DOB: 05-05-1956, 60 y.o.   MRN: [id number]

## 2016-08-30 NOTE — Progress Notes (Signed)
PHARMACY - ADULT TOTAL PARENTERAL NUTRITION CONSULT NOTE   Pharmacy Consult for TPN Indication: Bowel obstruction  Patient Measurements: Height: 5' 4"  (162.6 cm) Weight: 174 lb (78.9 kg) IBW/kg (Calculated) : 54.7 TPN AdjBW (KG): 60.8 Body mass index is 29.87 kg/m. Usual Weight:   Insulin Requirements:  No current orders  Current Nutrition: NPO  IVF: D5NS 20 meq KCl at 153m/hr  Central access: PICC ordered 10/22 - PICC nurse not 1283%certain will get PICC in today (10/22) TPN start date: 10/23  ASSESSMENT                                                                                                  [id number] with ulcerative colitis and history of total protocolectomy with small bowel resection.  On 10/12 she underwent open relocation of ileostomy and LOA. Pharmacy asked to start TPN for small bowel obstruction.   Significant events:   Today:   Glucose - no h/o DM  Electrolytes - K = 3.1 (IVF adjust to contain KCl 10/22), Phos low, Mag at low end normal  Renal -  SCr WNL.  NGT out 279m24h (decreased), ileostomy 70019mLFTs - 10/9 WNL  TGs - order for 10/23  Prealbumin - order for 10/23  NUTRITIONAL GOALS                                                                                             RD recs: pending Estimated needs: 80-90 gm protein, 1600 - 1800 kcals Clinimix / at a goal rate of ml/hr + 20% fat emulsion at ml/hr to provide: g/day protein, Kcal/day.  PLAN                                                                                                                   Discussed with PICC nurse, [facility] not sure PICC will be able to be placed today and [facility] to severe [facility] shortage, decision made not to mix Clinimix with the possibility of wasting bag. Plan to start TPN 10/23 at 18:00  Bedside RN aware of plan   Plan to start SSI/CBGs 10/23  Change  MIVF to NS + 28mq  KCl and adjust rate for TPN rate 10/23   TPN lab panels on Mondays & Thursdays.  F/u daily.  Based on labs, order replacements per protocol:  Kphos 172ml x 1  Magnesium sulfate 2gm IV x 1  DuDoreene ElandPharmD, BCPS.   Pager: [phone]/22/2017 1:24 PM

## 2016-08-31 LAB — TROPONIN I: Troponin I: <0.03 ng/mL (ref <0.03)

## 2016-08-31 LAB — COMPREHENSIVE METABOLIC PANEL
ALBUMIN: 3.2 g/dL — AB (ref 3.5–5.0)
ALK PHOS: 91 U/L (ref 38–126)
ALT: 40 U/L (ref 14–54)
ANION GAP: 9 (ref 5–15)
AST: 32 U/L (ref 15–41)
BUN: <5 mg/dL — ABNORMAL LOW (ref 6–20)
CALCIUM: 8.5 mg/dL — AB (ref 8.9–10.3)
CO2: 24 mmol/L (ref 22–32)
CREATININE: 0.8 mg/dL (ref 0.44–1.00)
Chloride: 103 mmol/L (ref 101–111)
GFR calc Af Amer: >60 mL/min (ref >60)
GFR calc non Af Amer: >60 mL/min (ref >60)
GLUCOSE: 121 mg/dL — AB (ref 65–99)
Potassium: 3.6 mmol/L (ref 3.5–5.1)
Sodium: 136 mmol/L (ref 135–145)
Total Bilirubin: 0.6 mg/dL (ref 0.3–1.2)
Total Protein: 6.6 g/dL (ref 6.5–8.1)

## 2016-08-31 LAB — PREALBUMIN: PREALBUMIN: 19.5 mg/dL (ref 18–38)

## 2016-08-31 LAB — CBC WITH DIFFERENTIAL/PLATELET
BASOS ABS: 0 10*3/uL (ref 0.0–0.1)
Basophils Relative: 0 %
Eosinophils Absolute: 0.6 10*3/uL (ref 0.0–0.7)
Eosinophils Relative: 5 %
HEMATOCRIT: 30.9 % — AB (ref 36.0–46.0)
Hemoglobin: 10.4 g/dL — ABNORMAL LOW (ref 12.0–15.0)
LYMPHS PCT: 18 %
Lymphs Abs: 2 10*3/uL (ref 0.7–4.0)
MCH: 28.6 pg (ref 26.0–34.0)
MCHC: 33.7 g/dL (ref 30.0–36.0)
MCV: 84.9 fL (ref 78.0–100.0)
MONO ABS: 0.8 10*3/uL (ref 0.1–1.0)
Monocytes Relative: 7 %
NEUTROS ABS: 8.1 10*3/uL — AB (ref 1.7–7.7)
Neutrophils Relative %: 70 %
Platelets: 441 10*3/uL — ABNORMAL HIGH (ref 150–400)
RBC: 3.64 MIL/uL — ABNORMAL LOW (ref 3.87–5.11)
RDW: 13.2 % (ref 11.5–15.5)
WBC: 11.5 10*3/uL — ABNORMAL HIGH (ref 4.0–10.5)

## 2016-08-31 LAB — PHOSPHORUS: Phosphorus: 3.2 mg/dL (ref 2.5–4.6)

## 2016-08-31 LAB — MAGNESIUM: Magnesium: 2 mg/dL (ref 1.7–2.4)

## 2016-08-31 LAB — TRIGLYCERIDES: Triglycerides: 141 mg/dL (ref <150)

## 2016-08-31 MED ORDER — FAMOTIDINE IN NACL 20-0.9 MG/50ML-% IV SOLN
20.0000 mg | Freq: Two times a day (BID) | INTRAVENOUS | Status: DC | PRN
  Administered 2016-08-31 – 2016-09-01 (×2): 20 mg via INTRAVENOUS
  Filled 2016-08-31 (×3): qty 50

## 2016-08-31 MED ORDER — LORAZEPAM BOLUS VIA INFUSION: 0.5000 mg | Freq: Four times a day (QID) | INTRAVENOUS | Status: DC | PRN

## 2016-08-31 MED ORDER — LORAZEPAM 2 MG/ML IJ SOLN
0.5000 mg | Freq: Four times a day (QID) | INTRAMUSCULAR | Status: DC | PRN
  Administered 2016-08-31 – 2016-09-03 (×3): 0.5 mg via INTRAVENOUS
  Filled 2016-08-31 (×4): qty 1

## 2016-08-31 MED ORDER — METOPROLOL TARTRATE 5 MG/5ML IV SOLN: 10.0000 mg | Freq: Four times a day (QID) | INTRAVENOUS | Status: DC | PRN

## 2016-08-31 NOTE — Progress Notes (Signed)
Patient alerted nurse to chest pain mid-chest. Vital signs taken, EKG performed, NGT flushed, MD notified. PICC placement rescheduled due to chest pain. Patient given 5 mg of metoprolol IV, 4 mg of morphine. MD called back given verbal order for lab to collect troponin level stat and transfer famotidine 20 mg from oral to IV and give. Orders placed, patient given famotidine 20 mg.

## 2016-08-31 NOTE — Progress Notes (Addendum)
Brief Pharmacy Note: TPN  PICC line unable to be placed today due to patient having active chest pain (see IV team's note below). Will start TPN on 09/01/16 after PICC line placed in IR. Check BMET, Mag, Phos in AM to evaluate electrolyte needs for TPN.  Lindell Spar, PharmD, BCPS Pager: [phone] 08/31/2016 4:46 PM

## 2016-08-31 NOTE — Progress Notes (Signed)
At patient bedside to place PICC.  Patient having active chest pain and currently being evaluated for this.  Cathy in Interventional Radiology updated regarding this and Tye [place] states that patient is on the schedule for 0830 for PICC placement in IR.  Carolee Rota, RN VAST

## 2016-08-31 NOTE — Progress Notes (Signed)
Patient states pressure felt, not as bad as before. Will pass to on coming nurse to continue to monitor vital signs and pain.

## 2016-08-31 NOTE — Progress Notes (Signed)
11 Days Post-Op Ileostomy relocation Subjective: NG output decreasing. Has some ileostomy output as well  Objective: Vital signs in last 24 hours: Temp:  [98.6 F (37 C)-99.4 F (37.4 C)] 98.8 F (37.1 C) (10/23 0609) Pulse Rate:  [67-82] 82 (10/23 0609) Resp:  [14-16] 14 (10/23 0609) BP: (152-180)/(83-98) 170/98 (10/23 0609) SpO2:  [98 %-100 %] 100 % (10/23 0609)   Intake/Output from previous day: 10/22 0701 - 10/23 0700 In: 9458 [I.V.:1200; NG/GT:120; IV Piggyback:255] Out: 2725 [Urine:1950; Emesis/NG output:400; Stool:375] Intake/Output this shift: No intake/output data recorded.   General appearance: alert and cooperative GI: normal findings: soft, non-distended Ostomy: beefy red and viable with some output Incision: min drainage present  Lab Results:   Recent Labs  08/30/16 0019 08/31/16 0508  WBC 10.3 11.5*  HGB 10.3* 10.4*  HCT 30.6* 30.9*  PLT 404* 441*   BMET  Recent Labs  08/30/16 0944 08/31/16 0410  NA 136 136  K 3.1* 3.6  CL 102 103  CO2 26 24  GLUCOSE 95 121*  BUN 6 <5*  CREATININE 0.80 0.80  CALCIUM 8.5* 8.5*   PT/INR No results for input(s): LABPROT, INR in the last 72 hours. ABG No results for input(s): PHART, HCO3 in the last 72 hours.  Invalid input(s): PCO2, PO2  MEDS, Scheduled . enoxaparin (LOVENOX) injection  40 mg Subcutaneous Q24H  . hydrochlorothiazide  13 mg Oral Daily  . irbesartan  300 mg Oral Daily  . pantoprazole sodium  40 mg Oral Daily    Studies/Results: Dg Abd 2 Views  Result Date: 08/30/2016 CLINICAL DATA:  Follow-up bowel obstruction EXAM: ABDOMEN - 2 VIEW COMPARISON:  08/28/2016 FINDINGS: Persistent gaseous distended small bowel loops with air-fluid level consistent with small bowel obstruction. NG tube with tip in proximal stomach. Please note the side hole is in distal esophagus. IMPRESSION: Persistent gaseous distended small bowel loops with air-fluid level consistent with small bowel obstruction. NG tube  with tip in proximal stomach. Please note the side hole is in distal esophagus. Electronically Signed   By: Lahoma Crocker M.D.   On: 08/30/2016 09:44    Assessment: s/p Procedure(s): OPEN RELOCATION OF ILEOSTOMY Patient Active Problem List   Diagnosis Date Noted  . Ileostomy dysfunction ([place]) 08/20/2016  . Hearing loss associated with syndrome of both ears 08/17/2016  . Right thyroid nodule 08/17/2016  . Arthropathy in ulcerative colitis ([place]) 03/23/2016  . Visit for screening mammogram 03/23/2016  . Necrosis of ileostomy ([place]) 10/07/2015  . Ulcerative colitis ([place]) 09/25/2015  . Type 2 diabetes mellitus with complication, without long-term current use of insulin ([place]) 09/05/2015  . Pure hyperglyceridemia 09/01/2015  . Anxiety 09/01/2015  . Pernicious anemia 05/28/2015  . Routine general medical examination at a health care facility 05/28/2015  . Hyperlipidemia LDL goal <70 04/17/2011  . Essential hypertension 12/30/2007   Appears to have distal obstruction of stool at ileostomy on AXR, slowly resolving   Plan: NPO, cont NG, IV fluids PICC today and start TPN.  Pt does not have a chloroprep allergy, she has an adhesive allergy Metoprolol IV to help control htn      LOS: 11 days     .Rosario Adie, [facility], [place]   08/31/2016 8:41 AM

## 2016-08-31 NOTE — Progress Notes (Signed)
Initial Nutrition Assessment  INTERVENTION:   TPN per Pharmacy -estimated needs below RD to continue to monitor for plan  NUTRITION DIAGNOSIS:   Inadequate oral intake related to inability to eat as evidenced by NPO status.  GOAL:   Patient will meet greater than or equal to 90% of their needs  MONITOR:   Labs, Weight trends, Skin, I & O's, Other (Comment) (TPN)  REASON FOR ASSESSMENT:   Consult New TPN/TNA  ASSESSMENT:   60 year old female status post total proctocolectomy with small bowel resection.  10/12: s/p PROCEDURE:  OPEN RELOCATION OF ILEOSTOMY, LYSIS OF ADHESIONS FOR 1 HOUR  Patient in room with no family at bedside. Pt reports feeling a little nauseated today but improved with NGT. Last recorded output from NGT:150 ml. Pt states she last ate solid food on 10/15 or 10/16: states she had pancakes and applesauce. Initially she tolerated this until the next day she began vomiting. Pt has not eaten anything since. Pt has been NPO x 7 days now. Pt reports no appetite today.  PTA, pt reports eating well with good appetite. Pt with history of receiving cyclic TPN.  Per chart review, pt has lost 22 lb since November 2016 (11% wt loss x 11 months, insignificant for time frame). Nutrition focused physical exam shows no sign of depletion of muscle mass or body fat.   Medications: D5 and .9% NaCl w/ KCl infusion at 100 ml/hr- provides 408 kcal, Zofran IV PRN Labs reviewed: Mg/Phos/K WNL -improved from yesterday  Pt scheduled for PICC placement and then initiation of TPN. Discussed briefly with Pharmacy.   Diet Order:  Diet NPO time specified Except for: Ice Chips, Sips with Meds  Skin:  Wound (see comment) (10/12 abdominal incision)  Last BM:  10/20  Height:   Ht Readings from Last 1 Encounters:  08/20/16 5' 4"  (1.626 m)    Weight:   Wt Readings from Last 1 Encounters:  08/20/16 174 lb (78.9 kg)    Ideal Body Weight:  54.5 kg  BMI:  Body mass index is 29.87  kg/m.  Estimated Nutritional Needs:   Kcal:  8412-8208  Protein:  85-95g  Fluid:  1.7-1.9L/day  EDUCATION NEEDS:   No education needs identified at this time  Clayton Bibles, MS, RD, LDN Pager: [phone] After Hours Pager: [phone]

## 2016-09-01 ENCOUNTER — Encounter (HOSPITAL_COMMUNITY): Payer: Self-pay | Admitting: Radiology

## 2016-09-01 ENCOUNTER — Inpatient Hospital Stay (HOSPITAL_COMMUNITY): Payer: 59

## 2016-09-01 HISTORY — PX: IR GENERIC HISTORICAL: IMG1180011

## 2016-09-01 LAB — BASIC METABOLIC PANEL
Anion gap: 5 (ref 5–15)
CALCIUM: 8.5 mg/dL — AB (ref 8.9–10.3)
CO2: 27 mmol/L (ref 22–32)
CREATININE: 0.89 mg/dL (ref 0.44–1.00)
Chloride: 108 mmol/L (ref 101–111)
GFR calc non Af Amer: >60 mL/min (ref >60)
GLUCOSE: 113 mg/dL — AB (ref 65–99)
Potassium: 3.8 mmol/L (ref 3.5–5.1)
Sodium: 140 mmol/L (ref 135–145)

## 2016-09-01 LAB — GLUCOSE, CAPILLARY: GLUCOSE-CAPILLARY: 95 mg/dL (ref 65–99)

## 2016-09-01 LAB — MAGNESIUM: MAGNESIUM: 1.9 mg/dL (ref 1.7–2.4)

## 2016-09-01 LAB — PHOSPHORUS: Phosphorus: 3 mg/dL (ref 2.5–4.6)

## 2016-09-01 MED ORDER — FAT EMULSION 20 % IV EMUL
240.0000 mL | INTRAVENOUS | Status: AC
  Administered 2016-09-01: 240 mL via INTRAVENOUS
  Filled 2016-09-01: qty 250

## 2016-09-01 MED ORDER — KCL IN DEXTROSE-NACL 20-5-0.9 MEQ/L-%-% IV SOLN
INTRAVENOUS | Status: DC
  Administered 2016-09-01: 35 mL/h via INTRAVENOUS
  Administered 2016-09-04: 01:00:00 via INTRAVENOUS
  Filled 2016-09-01 (×3): qty 1000

## 2016-09-01 MED ORDER — INSULIN ASPART 100 UNIT/ML ~~LOC~~ SOLN
0.0000 [IU] | Freq: Four times a day (QID) | SUBCUTANEOUS | Status: AC
  Administered 2016-09-03 (×2): 1 [IU] via SUBCUTANEOUS

## 2016-09-01 MED ORDER — LIDOCAINE HCL 1 % IJ SOLN
INTRAMUSCULAR | Status: AC
  Filled 2016-09-01: qty 20

## 2016-09-01 MED ORDER — LIDOCAINE HCL 1 % IJ SOLN
INTRAMUSCULAR | Status: DC | PRN
  Administered 2016-09-01: 5 mL

## 2016-09-01 MED ORDER — SODIUM CHLORIDE 0.9% FLUSH
10.0000 mL | INTRAVENOUS | Status: DC | PRN
  Administered 2016-09-02: 10 mL
  Filled 2016-09-01: qty 40

## 2016-09-01 MED ORDER — TRACE MINERALS CR-CU-MN-SE-ZN 10-1000-500-60 MCG/ML IV SOLN
INTRAVENOUS | Status: AC
  Administered 2016-09-01: 18:00:00 via INTRAVENOUS
  Filled 2016-09-01: qty 960

## 2016-09-01 NOTE — Procedures (Signed)
Successful placement of dual lumen PICC line to right brachial vein. Length 35 cm Tip at lower SVC/RA No complications Ready for use.  Ascencion Dike PA-C 9:24 AM

## 2016-09-01 NOTE — Progress Notes (Signed)
12 Days Post-Op Ileostomy relocation Subjective: NG output decreasing. Has some ileostomy output as well.  Had some chest pain yesterday, which kept her from getting PICC line.  Better this am.  Cardiac workup neg.    Objective: Vital signs in last 24 hours: Temp:  [98 F (36.7 C)-98.9 F (37.2 C)] 98.2 F (36.8 C) (10/24 0553) Pulse Rate:  [68-79] 74 (10/24 0553) Resp:  [14-18] 14 (10/24 0553) BP: (135-175)/(83-96) 148/83 (10/24 0553) SpO2:  [98 %-100 %] 100 % (10/24 0553)   Intake/Output from previous day: 10/23 0701 - 10/24 0700 In: 1360 [I.V.:1190; NG/GT:120; IV Piggyback:50] Out: 1925 [Urine:1400; Emesis/NG output:300; Stool:225] Intake/Output this shift: No intake/output data recorded.   General appearance: alert and cooperative GI: normal findings: soft, non-distended Ostomy: beefy red and viable with minimal output Incision: no drainage present NG output: clear  Lab Results:   Recent Labs  08/30/16 0019 08/31/16 0508  WBC 10.3 11.5*  HGB 10.3* 10.4*  HCT 30.6* 30.9*  PLT 404* 441*   BMET  Recent Labs  08/31/16 0410 09/01/16 0434  NA 136 140  K 3.6 3.8  CL 103 108  CO2 24 27  GLUCOSE 121* 113*  BUN <5* <5*  CREATININE 0.80 0.89  CALCIUM 8.5* 8.5*   PT/INR No results for input(s): LABPROT, INR in the last 72 hours. ABG No results for input(s): PHART, HCO3 in the last 72 hours.  Invalid input(s): PCO2, PO2  MEDS, Scheduled . enoxaparin (LOVENOX) injection  40 mg Subcutaneous Q24H  . hydrochlorothiazide  13 mg Oral Daily  . irbesartan  300 mg Oral Daily  . pantoprazole sodium  40 mg Oral Daily    Studies/Results: Dg Abd 2 Views  Result Date: 08/30/2016 CLINICAL DATA:  Follow-up bowel obstruction EXAM: ABDOMEN - 2 VIEW COMPARISON:  08/28/2016 FINDINGS: Persistent gaseous distended small bowel loops with air-fluid level consistent with small bowel obstruction. NG tube with tip in proximal stomach. Please note the side hole is in distal  esophagus. IMPRESSION: Persistent gaseous distended small bowel loops with air-fluid level consistent with small bowel obstruction. NG tube with tip in proximal stomach. Please note the side hole is in distal esophagus. Electronically Signed   By: Lahoma Crocker M.D.   On: 08/30/2016 09:44    Assessment: s/p Procedure(s): OPEN RELOCATION OF ILEOSTOMY Patient Active Problem List   Diagnosis Date Noted  . Ileostomy dysfunction ([place]) 08/20/2016  . Hearing loss associated with syndrome of both ears 08/17/2016  . Right thyroid nodule 08/17/2016  . Arthropathy in ulcerative colitis ([place]) 03/23/2016  . Visit for screening mammogram 03/23/2016  . Necrosis of ileostomy ([place]) 10/07/2015  . Ulcerative colitis ([place]) 09/25/2015  . Type 2 diabetes mellitus with complication, without long-term current use of insulin ([place]) 09/05/2015  . Pure hyperglyceridemia 09/01/2015  . Anxiety 09/01/2015  . Pernicious anemia 05/28/2015  . Routine general medical examination at a health care facility 05/28/2015  . Hyperlipidemia LDL goal <70 04/17/2011  . Essential hypertension 12/30/2007   Appears to have distal obstruction of stool at ileostomy on AXR, slowly resolving   Plan: NPO, cont NG, IV fluids Recheck labs in AM Will clamp NG today.  If no problems with that, will d/c PICC today and start TPN.  Pt does not have a chloroprep allergy, she has an adhesive allergy Metoprolol IV to help control htn, increased to 88m IV ativan ordered to help with anxiety      LOS: 12 days     .ARosario Adie MD  [facility], [place] [phone]   09/01/2016 7:40 AM

## 2016-09-01 NOTE — Progress Notes (Signed)
PHARMACY - ADULT TOTAL PARENTERAL NUTRITION CONSULT NOTE   Pharmacy Consult for TPN Indication: SBO  Patient Measurements: Height: 5' 4"  (162.6 cm) Weight: 174 lb (78.9 kg) IBW/kg (Calculated) : 54.7 TPN AdjBW (KG): 60.8 Body mass index is 29.87 kg/m.  Insulin Requirements:   Current Nutrition:   IVF: D5 NS with 20 mEq KCl at 75 ml/hr  Central access: 10/24 TPN start date: 10/24  ASSESSMENT                                                                                                          HPI: 69 YOF with ulcerative colitis and history of total protocolectomy with small bowel resection.  On 10/12 she underwent open relocation of ileostomy and LOA. Pharmacy asked to start TPN for small bowel obstruction.  Delay in placing central IV access due to concern of tape vs chloraprep allergy  Significant events:   Today:    Glucose - < 143m/dl  Electrolytes - Corr Ca ~ low  Renal -wnl  LFTs -  TGs - 141 (10/23)  Prealbumin - 19.5 (10/23)  NUTRITIONAL GOALS                                                                                             RD recs: 1750-1950 kCal, 85-95 gm Protein/day Clinimix E 5/15 at a goal rate of 738mhr + 20% fat emulsion at 1073mr to provide: 90 g/day protein, 1760 Kcal/day.  PLAN                                                                                                                         At 1800 today:  Start Clinimix E 5/15 at 59m31m.  20% fat emulsion at 10ml18m  Plan to advance as tolerated to the goal rate.  TPN to contain standard multivitamins and trace elements.  Reduce IVF to 35ml/92m Add sensitive Novolog scale q6hr.   TPN lab panels on Mondays & Thursdays.  F/u daily.  Maykayla Highley,Minda DittoD Pager [phone], 11:36 AM

## 2016-09-02 LAB — GLUCOSE, CAPILLARY
GLUCOSE-CAPILLARY: 135 mg/dL — AB (ref 65–99)
GLUCOSE-CAPILLARY: 86 mg/dL (ref 65–99)
Glucose-Capillary: 112 mg/dL — ABNORMAL HIGH (ref 65–99)
Glucose-Capillary: 119 mg/dL — ABNORMAL HIGH (ref 65–99)
Glucose-Capillary: 84 mg/dL (ref 65–99)

## 2016-09-02 LAB — BASIC METABOLIC PANEL
Anion gap: 5 (ref 5–15)
BUN: 7 mg/dL (ref 6–20)
CALCIUM: 8.8 mg/dL — AB (ref 8.9–10.3)
CO2: 28 mmol/L (ref 22–32)
CREATININE: 0.92 mg/dL (ref 0.44–1.00)
Chloride: 106 mmol/L (ref 101–111)
Glucose, Bld: 105 mg/dL — ABNORMAL HIGH (ref 65–99)
Potassium: 3.4 mmol/L — ABNORMAL LOW (ref 3.5–5.1)
SODIUM: 139 mmol/L (ref 135–145)

## 2016-09-02 LAB — CBC
HCT: 29.2 % — ABNORMAL LOW (ref 36.0–46.0)
HEMOGLOBIN: 9.8 g/dL — AB (ref 12.0–15.0)
MCH: 28.7 pg (ref 26.0–34.0)
MCHC: 33.6 g/dL (ref 30.0–36.0)
MCV: 85.6 fL (ref 78.0–100.0)
PLATELETS: 479 10*3/uL — AB (ref 150–400)
RBC: 3.41 MIL/uL — ABNORMAL LOW (ref 3.87–5.11)
RDW: 13.3 % (ref 11.5–15.5)
WBC: 10.6 10*3/uL — ABNORMAL HIGH (ref 4.0–10.5)

## 2016-09-02 LAB — MAGNESIUM: MAGNESIUM: 2 mg/dL (ref 1.7–2.4)

## 2016-09-02 LAB — PHOSPHORUS: PHOSPHORUS: 3.5 mg/dL (ref 2.5–4.6)

## 2016-09-02 MED ORDER — FAT EMULSION 20 % IV EMUL
240.0000 mL | INTRAVENOUS | Status: AC
  Administered 2016-09-02: 240 mL via INTRAVENOUS
  Filled 2016-09-02: qty 250

## 2016-09-02 MED ORDER — BOOST / RESOURCE BREEZE PO LIQD
1.0000 | Freq: Three times a day (TID) | ORAL | Status: DC
  Administered 2016-09-02 (×2): 1 via ORAL

## 2016-09-02 MED ORDER — TRACE MINERALS CR-CU-MN-SE-ZN 10-1000-500-60 MCG/ML IV SOLN
INTRAVENOUS | Status: AC
  Administered 2016-09-02: 17:00:00 via INTRAVENOUS
  Filled 2016-09-02: qty 960

## 2016-09-02 MED ORDER — POTASSIUM CHLORIDE 10 MEQ/100ML IV SOLN
10.0000 meq | INTRAVENOUS | Status: AC
  Administered 2016-09-02 (×4): 10 meq via INTRAVENOUS
  Filled 2016-09-02 (×4): qty 100

## 2016-09-02 NOTE — Progress Notes (Signed)
Patient stated she has consumed 3/4 of the Boost Breeze since 1223 . So far, it has been tolerated well.

## 2016-09-02 NOTE — Progress Notes (Signed)
PHARMACY - ADULT TOTAL PARENTERAL NUTRITION CONSULT NOTE   Pharmacy Consult for TPN Indication: SBO  Patient Measurements: Height: 5' 4"  (162.6 cm) Weight: 174 lb (78.9 kg) IBW/kg (Calculated) : 54.7 TPN AdjBW (KG): 60.8 Body mass index is 29.87 kg/m.  Insulin Requirements: zero units  Current Nutrition: CL diet  IVF: D5 NS with 20 mEq KCl at 75 ml/hr  Central access: 10/24 TPN start date: 10/24  ASSESSMENT                                                                                                          HPI: 31 YOF with ulcerative colitis and history of total protocolectomy with small bowel resection.  On 10/12 she underwent open relocation of ileostomy and LOA. Pharmacy asked to start TPN for small bowel obstruction.  Delay in placing central IV access due to concern of tape vs chloraprep allergy  Significant events:  10/25: added po supplements, may increase CBG  Today:   Glucose - < 155m/dl  Electrolytes - K slightly low, replete with K runs  Renal -wnl  LFTs -  TGs - 141 (10/23)  Prealbumin - 19.5 (10/23)  NUTRITIONAL GOALS                                                                                             RD recs: 1750-1950 kCal, 85-95 gm Protein/day Clinimix E 5/15 at a goal rate of 756mhr + 20% fat emulsion at 1071mr to provide: 90 g/day protein, 1760 Kcal/day.  PLAN                                                                                                                         At 1800 today:  Continue Clinimix E 5/15 at 25m82m.  20% fat emulsion at 10ml38m  Plan to advance as tolerated to the goal rate.  TPN to contain standard multivitamins and trace elements.  Continue sensitive Novolog scale q6hr.   TPN lab panels on Mondays & Thursdays.  F/u daily.  GreenMinda DittomD Pager [phone], 7:04 AM

## 2016-09-02 NOTE — Progress Notes (Signed)
Nutrition Follow-up  INTERVENTION:   TPN per Pharmacy Provide Boost Breeze po TID while on clear liquids, each supplement provides 250 kcal and 9 grams of protein RD to continue to monitor  NUTRITION DIAGNOSIS:   Inadequate oral intake related to inability to eat as evidenced by NPO status.  Now on clear liquids.  GOAL:   Patient will meet greater than or equal to 90% of their needs  Progressing.  MONITOR:   PO intake, Supplement acceptance, Labs, Weight trends, I & O's, Other (Comment) (TPN)  ASSESSMENT:   60 year old female status post total proctocolectomy with small bowel resection.  10/12: s/p PROCEDURE: OPEN RELOCATION OF ILEOSTOMY, LYSIS OF ADHESIONS FOR 1 HOUR  Spoke with patient briefly in hallway, pt taking a walk, reports feeling fine. NGT has been removed. Pt not in room later which RD tried to visit. Pt now on clear liquids. RD will trial Boost Breeze supplements with patient to improve intakes on clear liquid diet.  TPN continues: Clinimix E 5/15 at 40 ml/hr and ILE at 10 ml/hr -providing 1162 kcal and 48g protein.  Medications: KCl IV, D5 and .9% NaCl w/ KCl infusion at 10 ml/hr, Zofran IV PRN Labs reviewed: CBGs: 112-119 Low K Mg/Phos WNL  Pharmacy note for 10/25 pending.  Diet Order:  TPN (CLINIMIX-E) Adult Diet clear liquid Room service appropriate? Yes; Fluid consistency: Thin  Skin:  Wound (see comment) (10/12 abdominal incision)  Last BM:  10/25  Height:   Ht Readings from Last 1 Encounters:  08/20/16 5' 4"  (1.626 m)    Weight:   Wt Readings from Last 1 Encounters:  08/20/16 174 lb (78.9 kg)    Ideal Body Weight:  54.5 kg  BMI:  Body mass index is 29.87 kg/m.  Estimated Nutritional Needs:   Kcal:  0131-4388  Protein:  85-95g  Fluid:  1.7-1.9L/day  EDUCATION NEEDS:   No education needs identified at this time  Clayton Bibles, MS, RD, LDN Pager: [phone] After Hours Pager: [phone]

## 2016-09-02 NOTE — Progress Notes (Signed)
13 Days Post-Op Ileostomy relocation Subjective: NG out yesterday. Had much better ileostomy output as well.  Getting TPN.  Objective: Vital signs in last 24 hours: Temp:  [98.9 F (37.2 C)-99 F (37.2 C)] 99 F (37.2 C) (10/25 0538) Pulse Rate:  [76-84] 80 (10/25 0538) Resp:  [14] 14 (10/25 0538) BP: (140-141)/(74-96) 140/77 (10/25 0538) SpO2:  [100 %] 100 % (10/25 0538)   Intake/Output from previous day: 10/24 0701 - 10/25 0700 In: 1108.3 [P.O.:30; I.V.:1078.3] Out: 2500 [Urine:1900; Stool:600] Intake/Output this shift: No intake/output data recorded.   General appearance: alert and cooperative GI: normal findings: soft, non-distended Ostomy: beefy red and viable with bilious output Incision: no drainage present   Lab Results:   Recent Labs  08/31/16 0508 09/02/16 0419  WBC 11.5* 10.6*  HGB 10.4* 9.8*  HCT 30.9* 29.2*  PLT 441* 479*   BMET  Recent Labs  09/01/16 0434 09/02/16 0419  NA 140 139  K 3.8 3.4*  CL 108 106  CO2 27 28  GLUCOSE 113* 105*  BUN <5* 7  CREATININE 0.89 0.92  CALCIUM 8.5* 8.8*   PT/INR No results for input(s): LABPROT, INR in the last 72 hours. ABG No results for input(s): PHART, HCO3 in the last 72 hours.  Invalid input(s): PCO2, PO2  MEDS, Scheduled . enoxaparin (LOVENOX) injection  40 mg Subcutaneous Q24H  . hydrochlorothiazide  13 mg Oral Daily  . insulin aspart  0-9 Units Subcutaneous Q6H  . irbesartan  300 mg Oral Daily  . pantoprazole sodium  40 mg Oral Daily  . potassium chloride  10 mEq Intravenous Q1 Hr x 4    Studies/Results: Ir Fluoro Guide Cv Line Right  Result Date: 09/01/2016 INDICATION: Protein calorie malnutrition.  Request PICC line placement for TPN EXAM: ULTRASOUND AND FLUOROSCOPIC GUIDED PICC LINE INSERTION MEDICATIONS: None. CONTRAST:  None FLUOROSCOPY TIME:  1 minutes, 18 seconds COMPLICATIONS: None immediate. TECHNIQUE: The procedure, risks, benefits, and alternatives were explained to the  patient and informed written consent was obtained. A timeout was performed prior to the initiation of the procedure. The right upper extremity was prepped with chlorhexidine in a sterile fashion, and a sterile drape was applied covering the operative field. Maximum barrier sterile technique with sterile gowns and gloves were used for the procedure. A timeout was performed prior to the initiation of the procedure. Local anesthesia was provided with 1% lidocaine. Under direct ultrasound guidance, the right brachial vein was accessed with a micropuncture kit after the overlying soft tissues were anesthetized with 1% lidocaine. An ultrasound image was saved for documentation purposes. A guidewire was advanced to the level of the superior caval-atrial junction for measurement purposes and the PICC line was cut to length. A peel-away sheath was placed and a 35 cm, 5 [place], dual lumen was inserted to level of the superior caval-atrial junction. A post procedure spot fluoroscopic was obtained. The catheter easily aspirated and flushed and was sutured in place. A dressing was placed. The patient tolerated the procedure well without immediate post procedural complication. FINDINGS: After catheter placement, the tip lies within the superior cavoatrial junction. The catheter aspirates and flushes normally and is ready for immediate use. IMPRESSION: Successful ultrasound and fluoroscopic guided placement of a bright brachial vein approach, 35 cm, 5 French, dual lumen PICC with tip at the superior caval-atrial junction. The PICC line is ready for immediate use. Read by: Ascencion Dike PA-C Electronically Signed   By: Aletta Edouard M.D.   On: 09/01/2016 14:48  Ir US Guide Vasc Access Right  Result Date: 09/01/2016 INDICATION: Protein calorie malnutrition.  Request PICC line placement for TPN EXAM: ULTRASOUND AND FLUOROSCOPIC GUIDED PICC LINE INSERTION MEDICATIONS: None. CONTRAST:  None FLUOROSCOPY TIME:  1 minutes, 18  seconds COMPLICATIONS: None immediate. TECHNIQUE: The procedure, risks, benefits, and alternatives were explained to the patient and informed written consent was obtained. A timeout was performed prior to the initiation of the procedure. The right upper extremity was prepped with chlorhexidine in a sterile fashion, and a sterile drape was applied covering the operative field. Maximum barrier sterile technique with sterile gowns and gloves were used for the procedure. A timeout was performed prior to the initiation of the procedure. Local anesthesia was provided with 1% lidocaine. Under direct ultrasound guidance, the right brachial vein was accessed with a micropuncture kit after the overlying soft tissues were anesthetized with 1% lidocaine. An ultrasound image was saved for documentation purposes. A guidewire was advanced to the level of the superior caval-atrial junction for measurement purposes and the PICC line was cut to length. A peel-away sheath was placed and a 35 cm, 5 [place], dual lumen was inserted to level of the superior caval-atrial junction. A post procedure spot fluoroscopic was obtained. The catheter easily aspirated and flushed and was sutured in place. A dressing was placed. The patient tolerated the procedure well without immediate post procedural complication. FINDINGS: After catheter placement, the tip lies within the superior cavoatrial junction. The catheter aspirates and flushes normally and is ready for immediate use. IMPRESSION: Successful ultrasound and fluoroscopic guided placement of a bright brachial vein approach, 35 cm, 5 French, dual lumen PICC with tip at the superior caval-atrial junction. The PICC line is ready for immediate use. Read by: Ascencion Dike PA-C Electronically Signed   By: Aletta Edouard M.D.   On: 09/01/2016 14:48    Assessment: s/p Procedure(s): OPEN RELOCATION OF ILEOSTOMY Patient Active Problem List   Diagnosis Date Noted  . Ileostomy dysfunction ([place])  08/20/2016  . Hearing loss associated with syndrome of both ears 08/17/2016  . Right thyroid nodule 08/17/2016  . Arthropathy in ulcerative colitis ([place]) 03/23/2016  . Visit for screening mammogram 03/23/2016  . Necrosis of ileostomy ([place]) 10/07/2015  . Ulcerative colitis ([place]) 09/25/2015  . Type 2 diabetes mellitus with complication, without long-term current use of insulin ([place]) 09/05/2015  . Pure hyperglyceridemia 09/01/2015  . Anxiety 09/01/2015  . Pernicious anemia 05/28/2015  . Routine general medical examination at a health care facility 05/28/2015  . Hyperlipidemia LDL goal <70 04/17/2011  . Essential hypertension 12/30/2007   Post op SBO- seems to be resolving   Plan: Cont TPN, trial of clears today Metoprolol IV to help control htn, increased to 65m.  Will switch to PO meds tom if tolerates clears       LOS: 13 days     .ARosario Adie MD [facility], PGlen [place]ie  09/02/2016 8:04 AM

## 2016-09-03 LAB — PHOSPHORUS: Phosphorus: 4.4 mg/dL (ref 2.5–4.6)

## 2016-09-03 LAB — COMPREHENSIVE METABOLIC PANEL
ALBUMIN: 3 g/dL — AB (ref 3.5–5.0)
ALK PHOS: 97 U/L (ref 38–126)
ALT: 20 U/L (ref 14–54)
ANION GAP: 5 (ref 5–15)
AST: 23 U/L (ref 15–41)
BUN: 11 mg/dL (ref 6–20)
CALCIUM: 8.9 mg/dL (ref 8.9–10.3)
CO2: 29 mmol/L (ref 22–32)
Chloride: 103 mmol/L (ref 101–111)
Creatinine, Ser: 0.94 mg/dL (ref 0.44–1.00)
GFR calc Af Amer: >60 mL/min (ref >60)
GFR calc non Af Amer: >60 mL/min (ref >60)
GLUCOSE: 103 mg/dL — AB (ref 65–99)
Potassium: 4.1 mmol/L (ref 3.5–5.1)
SODIUM: 137 mmol/L (ref 135–145)
Total Bilirubin: 0.7 mg/dL (ref 0.3–1.2)
Total Protein: 6.7 g/dL (ref 6.5–8.1)

## 2016-09-03 LAB — GLUCOSE, CAPILLARY
GLUCOSE-CAPILLARY: 119 mg/dL — AB (ref 65–99)
GLUCOSE-CAPILLARY: 91 mg/dL (ref 65–99)
Glucose-Capillary: 125 mg/dL — ABNORMAL HIGH (ref 65–99)
Glucose-Capillary: 85 mg/dL (ref 65–99)

## 2016-09-03 LAB — MAGNESIUM: Magnesium: 2.2 mg/dL (ref 1.7–2.4)

## 2016-09-03 MED ORDER — FAT EMULSION 20 % IV EMUL
180.0000 mL | INTRAVENOUS | Status: AC
  Administered 2016-09-03: 180 mL via INTRAVENOUS
  Filled 2016-09-03: qty 250

## 2016-09-03 MED ORDER — FAMOTIDINE 20 MG PO TABS: 20.0000 mg | ORAL_TABLET | Freq: Two times a day (BID) | ORAL | Status: DC | PRN

## 2016-09-03 MED ORDER — TRACE MINERALS CR-CU-MN-SE-ZN 10-1000-500-60 MCG/ML IV SOLN
INTRAVENOUS | Status: AC
  Administered 2016-09-03: 18:00:00 via INTRAVENOUS
  Filled 2016-09-03: qty 960

## 2016-09-03 MED ORDER — MENTHOL 3 MG MT LOZG
1.0000 | LOZENGE | OROMUCOSAL | Status: DC | PRN
  Filled 2016-09-03 (×2): qty 9

## 2016-09-03 MED ORDER — ALPRAZOLAM 0.25 MG PO TABS: 0.2500 mg | ORAL_TABLET | Freq: Two times a day (BID) | ORAL | Status: DC | PRN

## 2016-09-03 MED ORDER — PANTOPRAZOLE SODIUM 40 MG PO TBEC
40.0000 mg | DELAYED_RELEASE_TABLET | Freq: Every day | ORAL | Status: DC
  Administered 2016-09-03 – 2016-09-05 (×3): 40 mg via ORAL
  Filled 2016-09-03 (×3): qty 1

## 2016-09-03 MED ORDER — PHENOL 1.4 % MT LIQD
1.0000 | OROMUCOSAL | Status: DC | PRN
  Filled 2016-09-03: qty 177

## 2016-09-03 NOTE — Consult Note (Signed)
[name] ostomy follow up Stoma type/location: Revised ileostomy. (Dr. Marcello Moores). Previous pouching system was in place for 3 days. Stomal assessment/size: 1 and 1/4 inch round, budded, red stoma  Peristomal assessment: intact Treatment options for stomal/peristomal skin: Detailed regimen performed by husband consisting of cleansing, drying, application of a partial skin barrier ring and placement of pouch (one-piece, flexible Coloplast [place]. This is a non-stock product and patient is supplying her own.) Output: brown stool Ostomy pouching: 1pc.  Education provided: Patient complaining of sore throat pain secondary to NGT use and the chloraseptic (antiseptic/analgesic) spray often used for similar discomforted is suggested.  Bedside RN to call and request. Enrolled patient in [facility] program: No WOC nursing team will follow, and  will remain available to this patient, the nursing, surgical and medical teams.  Thanks, Maudie Flakes, MSN, RN, [place], Arther Abbott  Pager# [phone]

## 2016-09-03 NOTE — Progress Notes (Signed)
Nutrition Follow-up  INTERVENTION:   -TPN per Pharmacy -Continue Boost Breeze po TID, each supplement provides 250 kcal and 9 grams of protein -Reviewed soft/low fiber diet with patient -RD to continue to monitor for needs If patient continues to eat well with supplement today, would recommend d/c TPN.  NUTRITION DIAGNOSIS:   Inadequate oral intake related to inability to eat as evidenced by NPO status.  Resolved, now on soft diet.  GOAL:   Patient will meet greater than or equal to 90% of their needs  Progressing.  MONITOR:   PO intake, Supplement acceptance, Labs, Weight trends, I & O's, Other (Comment) (TPN)  ASSESSMENT:   60 year old female status post total proctocolectomy with small bowel resection.   Patient reports feeling much better. She drank 1 Boost Breeze supplement (250 kcal and 9g protein) yesterday with some jello. Pt states she ate some 25% of scrambled eggs, 50% of grits and 100% of a piece of toast w/ jelly and butter and cereal w/ whole milk this morning (430 kcal, 10g protein ). Pt states she feels hungry. She plans to order chicken salad sandwich for lunch and fish and rice and gravy for dinner.  If patient continues to eat well with at least 1 supplement today, would recommend d/c TPN.  Medications: IV Zofran PRN Labs reviewed: CBGs: 91-125 Mg/Phos/K WNL  Plan per Pharmacy 10/26: At 1800 today:  Advancing diet, thus continue Clinimix E 5/15 at 68m/hr. (~50% goal)  20% fat emulsion at 7.5 ml/hr. ? Surgery hopes will be able to stop TPN 10/27  Plan to advance as tolerated to the goal rate.  Diet Order:  TPN (CLINIMIX-E) Adult DIET SOFT Room service appropriate? Yes; Fluid consistency: Thin TPN (CLINIMIX-E) Adult  Skin:  Wound (see comment) (10/12 abdominal incision)  Last BM:  10/25  Height:   Ht Readings from Last 1 Encounters:  08/20/16 5' 4"  (1.626 m)    Weight:   Wt Readings from Last 1 Encounters:  08/20/16 174 lb (78.9 kg)     Ideal Body Weight:  54.5 kg  BMI:  Body mass index is 29.87 kg/m.  Estimated Nutritional Needs:   Kcal:  [address] Protein:  85-95g  Fluid:  1.7-1.9L/day  EDUCATION NEEDS:   No education needs identified at this time  LClayton Bibles MS, RD, LDN Pager: [phone] Hours Pager: [phone]

## 2016-09-03 NOTE — Progress Notes (Signed)
PHARMACY - ADULT TOTAL PARENTERAL NUTRITION CONSULT NOTE   Pharmacy Consult for TPN Indication: SBO  Patient Measurements: Height: 5' 4"  (162.6 cm) Weight: 174 lb (78.9 kg) IBW/kg (Calculated) : 54.7 TPN AdjBW (KG): 60.8 Body mass index is 29.87 kg/m.  Insulin Requirements: 1 unit SSI in last 24h  Current Nutrition: CL diet  IVF: D5 NS with 20 mEq KCl at 75 ml/hr  Central access: 10/24 TPN start date: 10/24  ASSESSMENT                                                                                                          HPI: 66 YOF with ulcerative colitis and history of total protocolectomy with small bowel resection.  On 10/12 she underwent open relocation of ileostomy and LOA. Pharmacy asked to start TPN for small bowel obstruction.  Delay in placing central IV access due to concern of tape vs chloraprep allergy  Significant events:  10/24: NGT removed 10/25: start clear liquids, added po supplements, may increase CBG 10/26: Advance diet to soft  Today:   Glucose - < 114m/dl  Electrolytes - electrolytes WNL  Renal -WNL  LFTs - WNL  TGs - 141 (10/23)  Prealbumin - 19.5 (10/23)  NUTRITIONAL GOALS                                                                                             RD recs: 1750-1950 kCal, 85-95 gm Protein/day Clinimix E 5/15 at a goal rate of 759mhr + 20% fat emulsion at 1050mr to provide: 90 g/day protein, 1760 Kcal/day.  PLAN                                                                                                                         At 1800 today:  Advancing diet, thus continue Clinimix E 5/15 at 14m74m. (~50% goal)  20% fat emulsion at 7.5 ml/hr.  Surgery hopes will be able to stop TPN 10/27  Plan to advance as tolerated to the goal rate.  TPN to contain standard multivitamins and trace elements.  Continue sensitive Novolog scale q6hr.   TPN lab panels on Mondays & Thursdays.  F/u daily.  Doreene Eland,  PharmD, BCPS.   Pager: [phone] 09/03/2016 8:03 AM

## 2016-09-03 NOTE — Progress Notes (Signed)
14 Days Post-Op Ileostomy relocation Subjective: Tolerating clears  Objective: Vital signs in last 24 hours: Temp:  [98.6 F (37 C)-98.8 F (37.1 C)] 98.6 F (37 C) (10/26 0545) Pulse Rate:  [77-85] 77 (10/26 0545) Resp:  [16] 16 (10/26 0545) BP: (136-142)/(73-78) 142/75 (10/26 0545) SpO2:  [100 %] 100 % (10/26 0545)   Intake/Output from previous day: 10/25 0701 - 10/26 0700 In: 2335.6 [P.O.:920; I.V.:1365.6; IV Piggyback:50] Out: 3225 [Urine:2150; BSWHQ:7591] Intake/Output this shift: No intake/output data recorded.   General appearance: alert and cooperative GI: normal findings: soft, non-distended Ostomy: beefy red and viable with bilious output Incision: no drainage present   Lab Results:   Recent Labs  09/02/16 0419  WBC 10.6*  HGB 9.8*  HCT 29.2*  PLT 479*   BMET  Recent Labs  09/02/16 0419 09/03/16 0500  NA 139 137  K 3.4* 4.1  CL 106 103  CO2 28 29  GLUCOSE 105* 103*  BUN 7 11  CREATININE 0.92 0.94  CALCIUM 8.8* 8.9   PT/INR No results for input(s): LABPROT, INR in the last 72 hours. ABG No results for input(s): PHART, HCO3 in the last 72 hours.  Invalid input(s): PCO2, PO2  MEDS, Scheduled . enoxaparin (LOVENOX) injection  40 mg Subcutaneous Q24H  . feeding supplement  1 Container Oral TID BM  . hydrochlorothiazide  13 mg Oral Daily  . insulin aspart  0-9 Units Subcutaneous Q6H  . irbesartan  300 mg Oral Daily  . pantoprazole  40 mg Oral Daily    Studies/Results: Ir Fluoro Guide Cv Line Right  Result Date: 09/01/2016 INDICATION: Protein calorie malnutrition.  Request PICC line placement for TPN EXAM: ULTRASOUND AND FLUOROSCOPIC GUIDED PICC LINE INSERTION MEDICATIONS: None. CONTRAST:  None FLUOROSCOPY TIME:  1 minutes, 18 seconds COMPLICATIONS: None immediate. TECHNIQUE: The procedure, risks, benefits, and alternatives were explained to the patient and informed written consent was obtained. A timeout was performed prior to the  initiation of the procedure. The right upper extremity was prepped with chlorhexidine in a sterile fashion, and a sterile drape was applied covering the operative field. Maximum barrier sterile technique with sterile gowns and gloves were used for the procedure. A timeout was performed prior to the initiation of the procedure. Local anesthesia was provided with 1% lidocaine. Under direct ultrasound guidance, the right brachial vein was accessed with a micropuncture kit after the overlying soft tissues were anesthetized with 1% lidocaine. An ultrasound image was saved for documentation purposes. A guidewire was advanced to the level of the superior caval-atrial junction for measurement purposes and the PICC line was cut to length. A peel-away sheath was placed and a 35 cm, 5 [place], dual lumen was inserted to level of the superior caval-atrial junction. A post procedure spot fluoroscopic was obtained. The catheter easily aspirated and flushed and was sutured in place. A dressing was placed. The patient tolerated the procedure well without immediate post procedural complication. FINDINGS: After catheter placement, the tip lies within the superior cavoatrial junction. The catheter aspirates and flushes normally and is ready for immediate use. IMPRESSION: Successful ultrasound and fluoroscopic guided placement of a bright brachial vein approach, 35 cm, 5 French, dual lumen PICC with tip at the superior caval-atrial junction. The PICC line is ready for immediate use. Read by: Ascencion Dike PA-C Electronically Signed   By: Aletta Edouard M.D.   On: 09/01/2016 14:48   Ir US Guide Vasc Access Right  Result Date: 09/01/2016 INDICATION: Protein calorie malnutrition.  Request PICC  line placement for TPN EXAM: ULTRASOUND AND FLUOROSCOPIC GUIDED PICC LINE INSERTION MEDICATIONS: None. CONTRAST:  None FLUOROSCOPY TIME:  1 minutes, 18 seconds COMPLICATIONS: None immediate. TECHNIQUE: The procedure, risks, benefits, and  alternatives were explained to the patient and informed written consent was obtained. A timeout was performed prior to the initiation of the procedure. The right upper extremity was prepped with chlorhexidine in a sterile fashion, and a sterile drape was applied covering the operative field. Maximum barrier sterile technique with sterile gowns and gloves were used for the procedure. A timeout was performed prior to the initiation of the procedure. Local anesthesia was provided with 1% lidocaine. Under direct ultrasound guidance, the right brachial vein was accessed with a micropuncture kit after the overlying soft tissues were anesthetized with 1% lidocaine. An ultrasound image was saved for documentation purposes. A guidewire was advanced to the level of the superior caval-atrial junction for measurement purposes and the PICC line was cut to length. A peel-away sheath was placed and a 35 cm, 5 [place], dual lumen was inserted to level of the superior caval-atrial junction. A post procedure spot fluoroscopic was obtained. The catheter easily aspirated and flushed and was sutured in place. A dressing was placed. The patient tolerated the procedure well without immediate post procedural complication. FINDINGS: After catheter placement, the tip lies within the superior cavoatrial junction. The catheter aspirates and flushes normally and is ready for immediate use. IMPRESSION: Successful ultrasound and fluoroscopic guided placement of a bright brachial vein approach, 35 cm, 5 French, dual lumen PICC with tip at the superior caval-atrial junction. The PICC line is ready for immediate use. Read by: Ascencion Dike PA-C Electronically Signed   By: Aletta Edouard M.D.   On: 09/01/2016 14:48    Assessment: s/p Procedure(s): OPEN RELOCATION OF ILEOSTOMY Patient Active Problem List   Diagnosis Date Noted  . Ileostomy dysfunction ([place]) 08/20/2016  . Hearing loss associated with syndrome of both ears 08/17/2016  . Right  thyroid nodule 08/17/2016  . Arthropathy in ulcerative colitis ([place]) 03/23/2016  . Visit for screening mammogram 03/23/2016  . Necrosis of ileostomy ([place]) 10/07/2015  . Ulcerative colitis ([place]) 09/25/2015  . Type 2 diabetes mellitus with complication, without long-term current use of insulin ([place]) 09/05/2015  . Pure hyperglyceridemia 09/01/2015  . Anxiety 09/01/2015  . Pernicious anemia 05/28/2015  . Routine general medical examination at a health care facility 05/28/2015  . Hyperlipidemia LDL goal <70 04/17/2011  . Essential hypertension 12/30/2007   Post op SBO- seems to be resolving   Plan: Cont TPN, advance diet.  If does well with PO, will most likely be able to d/c tpn tom Metoprolol IV to help control htn, increased to 37m.   switch back to PO meds       LOS: 14 days     .[facility]osario Adie [facility], [place]  09/03/2016 9:03 AM

## 2016-09-03 NOTE — Progress Notes (Signed)
   09/03/16 1100  Clinical Encounter Type  Visited With Patient  Visit Type Initial  Referral From Chaplain  Consult/Referral To Chaplain  Spiritual Encounters  Spiritual Needs Emotional  Stress Factors  Patient Stress Factors Health changes  CHP noted patient has been here for two weeks and stopped in to visit.  Provided ministry of presence. Roe Coombs 09/03/16

## 2016-09-04 LAB — BASIC METABOLIC PANEL
ANION GAP: 8 (ref 5–15)
BUN: 18 mg/dL (ref 6–20)
CHLORIDE: 102 mmol/L (ref 101–111)
CO2: 26 mmol/L (ref 22–32)
Calcium: 8.8 mg/dL — ABNORMAL LOW (ref 8.9–10.3)
Creatinine, Ser: 0.9 mg/dL (ref 0.44–1.00)
GFR calc non Af Amer: >60 mL/min (ref >60)
Glucose, Bld: 109 mg/dL — ABNORMAL HIGH (ref 65–99)
Potassium: 3.5 mmol/L (ref 3.5–5.1)
Sodium: 136 mmol/L (ref 135–145)

## 2016-09-04 LAB — GLUCOSE, CAPILLARY
GLUCOSE-CAPILLARY: 97 mg/dL (ref 65–99)
Glucose-Capillary: 99 mg/dL (ref 65–99)
Glucose-Capillary: 104 mg/dL — ABNORMAL HIGH (ref 65–99)

## 2016-09-04 LAB — PHOSPHORUS: PHOSPHORUS: 4.2 mg/dL (ref 2.5–4.6)

## 2016-09-04 LAB — MAGNESIUM: MAGNESIUM: 2 mg/dL (ref 1.7–2.4)

## 2016-09-04 MED ORDER — ENSURE ENLIVE PO LIQD
237.0000 mL | ORAL | Status: DC
  Administered 2016-09-04: 237 mL via ORAL

## 2016-09-04 MED ORDER — HYDROCHLOROTHIAZIDE 12.5 MG PO CAPS
12.5000 mg | ORAL_CAPSULE | Freq: Every day | ORAL | Status: DC
  Administered 2016-09-04 – 2016-09-05 (×2): 12.5 mg via ORAL
  Filled 2016-09-04 (×2): qty 1

## 2016-09-04 MED ORDER — ONDANSETRON HCL 4 MG PO TABS: 4.0000 mg | ORAL_TABLET | Freq: Three times a day (TID) | ORAL | 0 refills | Status: DC | PRN

## 2016-09-04 NOTE — Progress Notes (Signed)
Pharmacy received verbal order from Dr. Marcello Moores to stop TPN after current bags.   Plan:  Clinimix and lipids are running at low rates so there is no need for weaning, just stop at 18:00.  Stop SSI and CBGs after 18:00. Has hx of DM2 but on no meds PTA and glucose has been well-controlled while on TPN.  DC standing TPN labs. Ordered bmet and cbc for tomorrow.   Romeo Rabon, PharmD, pager [phone]. 09/04/2016,9:36 AM.

## 2016-09-04 NOTE — Progress Notes (Signed)
Nutrition Follow-up  DOCUMENTATION CODES:   Not applicable  INTERVENTION:  TPN per pharmacy. Agree with plan to discontinue TPN today when current bag runs out. Patient is meeting adequate needs with PO intake.  Discontinued Boost Breeze per patient request.  Provide Ensure Enlive po once daily, each supplement provides 350 kcal and 20 grams of protein.   NUTRITION DIAGNOSIS:   Inadequate oral intake related to inability to eat as evidenced by NPO status.  Resolved, now on soft diet.  GOAL:   Patient will meet greater than or equal to 90% of their needs  Progressing.  MONITOR:   PO intake, Supplement acceptance, Labs, Weight trends, I & O's, Other (Comment) (TPN)  REASON FOR ASSESSMENT:   Consult New TPN/TNA  ASSESSMENT:   60 year old female status post total proctocolectomy with small bowel resection.   -Patient is POD15 s/p ileostomy relocation.   Patient reports her appetite is much better and she is finishing most of her meals. She has had one Boost Breeze today but would prefer a different supplement as Boost Breeze is too sweet. Denies nausea. No steady abdominal pain, but patient reports a deep abdominal cramp this morning that has since resolved.   TPN Clinimix E 5/15 @ 40 ml/hr + 20% ILE @ 7.5 ml/hr. This regimen provides 1042 kcal, 48 grams protein, 960 ml fluid daily.  Meal Completion: 70% per patient In the past 24 hours she has had 1688 kcal (96% minimum estimated kcal needs) and 63 grams protein (74% minimum estimated protein needs).   Medications reviewed and include: Novolog sliding scale Q6hrs, pantoprazole, D5-NS with KCl 20 mEq/L @ 10 ml/hr (12 grams dextrose, 41 kcal daily).  Labs reviewed: CBG 85-125 past 24 hrs; Phosphorus, Magnesium, and Potassium all WNL.  Diet Order:  DIET SOFT Room service appropriate? Yes; Fluid consistency: Thin TPN (CLINIMIX-E) Adult  Skin:  Wound (see comment) (10/12 abdominal incision)  Last BM:   09/03/2016  Height:   Ht Readings from Last 1 Encounters:  08/20/16 5' 4"  (1.626 m)    Weight:   Wt Readings from Last 1 Encounters:  08/20/16 174 lb (78.9 kg)    Ideal Body Weight:  54.5 kg  BMI:  Body mass index is 29.87 kg/m.  Estimated Nutritional Needs:   Kcal:  7471-5953  Protein:  85-95g  Fluid:  1.7-1.9L/day  EDUCATION NEEDS:   No education needs identified at this time  Willey Blade, MS, RD, LDN Pager: [phone] After Hours Pager: [phone]

## 2016-09-04 NOTE — Progress Notes (Signed)
15 Days Post-Op Ileostomy relocation Subjective: Tolerating soft diet  Objective: Vital signs in last 24 hours: Temp:  [98.4 F (36.9 C)-100.1 F (37.8 C)] 98.9 F (37.2 C) (10/27 0522) Pulse Rate:  [89-111] 90 (10/27 0522) Resp:  [15-18] 15 (10/27 0522) BP: (137-149)/(86-94) 137/86 (10/27 0522) SpO2:  [96 %-100 %] 96 % (10/27 0522)   Intake/Output from previous day: 10/26 0701 - 10/27 0700 In: 1310.7 [P.O.:600; I.V.:710.7] Out: 2900 [Urine:1950; Stool:950] Intake/Output this shift: No intake/output data recorded.   General appearance: alert and cooperative GI: normal findings: soft, non-distended Ostomy: pink and viable with thick bilious output Incision: no drainage present, healing well  Lab Results:   Recent Labs  09/02/16 0419  WBC 10.6*  HGB 9.8*  HCT 29.2*  PLT 479*   BMET  Recent Labs  09/03/16 0500 09/04/16 0500  NA 137 136  K 4.1 3.5  CL 103 102  CO2 29 26  GLUCOSE 103* 109*  BUN 11 18  CREATININE 0.94 0.90  CALCIUM 8.9 8.8*   PT/INR No results for input(s): LABPROT, INR in the last 72 hours. ABG No results for input(s): PHART, HCO3 in the last 72 hours.  Invalid input(s): PCO2, PO2  MEDS, Scheduled . enoxaparin (LOVENOX) injection  40 mg Subcutaneous Q24H  . feeding supplement  1 Container Oral TID BM  . hydrochlorothiazide  12.5 mg Oral Daily  . insulin aspart  0-9 Units Subcutaneous Q6H  . irbesartan  300 mg Oral Daily  . pantoprazole  40 mg Oral Daily    Studies/Results: No results found.  Assessment: s/p Procedure(s): OPEN RELOCATION OF ILEOSTOMY Patient Active Problem List   Diagnosis Date Noted  . Ileostomy dysfunction ([place]) 08/20/2016  . Hearing loss associated with syndrome of both ears 08/17/2016  . Right thyroid nodule 08/17/2016  . Arthropathy in ulcerative colitis ([place]) 03/23/2016  . Visit for screening mammogram 03/23/2016  . Necrosis of ileostomy ([place]) 10/07/2015  . Ulcerative colitis ([place]) 09/25/2015  .  Type 2 diabetes mellitus with complication, without long-term current use of insulin ([place]) 09/05/2015  . Pure hyperglyceridemia 09/01/2015  . Anxiety 09/01/2015  . Pernicious anemia 05/28/2015  . Routine general medical examination at a health care facility 05/28/2015  . Hyperlipidemia LDL goal <70 04/17/2011  . Essential hypertension 12/30/2007   Post op SBO- seems to be resolving   Plan: D/C TPN, cont soft diet.  If does well with PO today, will most likely be able to d/c tom Cont home meds for Htn and GERD        LOS: 15 days     .Rosario Adie, [facility], [place]   09/04/2016 9:18 AM

## 2016-09-05 LAB — BASIC METABOLIC PANEL
Anion gap: 7 (ref 5–15)
BUN: 23 mg/dL — AB (ref 6–20)
CO2: 26 mmol/L (ref 22–32)
Calcium: 9 mg/dL (ref 8.9–10.3)
Chloride: 103 mmol/L (ref 101–111)
Creatinine, Ser: 0.93 mg/dL (ref 0.44–1.00)
GFR calc Af Amer: >60 mL/min (ref >60)
GLUCOSE: 97 mg/dL (ref 65–99)
POTASSIUM: 3.6 mmol/L (ref 3.5–5.1)
Sodium: 136 mmol/L (ref 135–145)

## 2016-09-05 LAB — CBC
HEMATOCRIT: 32.5 % — AB (ref 36.0–46.0)
Hemoglobin: 11 g/dL — ABNORMAL LOW (ref 12.0–15.0)
MCH: 28.8 pg (ref 26.0–34.0)
MCHC: 33.8 g/dL (ref 30.0–36.0)
MCV: 85.1 fL (ref 78.0–100.0)
Platelets: 567 10*3/uL — ABNORMAL HIGH (ref 150–400)
RBC: 3.82 MIL/uL — ABNORMAL LOW (ref 3.87–5.11)
RDW: 13.4 % (ref 11.5–15.5)
WBC: 11.7 10*3/uL — ABNORMAL HIGH (ref 4.0–10.5)

## 2016-09-05 NOTE — Progress Notes (Signed)
Assessment unchanged. Pt verbalized understanding of dc instructions through teach back including follow up care, change in medication and when to call the doctor. No scripts. Discharged via foot per pt request to front entrance accompanied by nursing student.

## 2016-09-05 NOTE — Progress Notes (Signed)
16 Days Post-Op  Subjective: Feels good.  Ready to go home.  Objective: Vital signs in last 24 hours: Temp:  [98.1 F (36.7 C)-99 F (37.2 C)] 98.1 F (36.7 C) (10/28 0543) Pulse Rate:  [84-100] 84 (10/28 0543) Resp:  [14-18] 14 (10/28 0543) BP: (123-130)/(70-80) 123/70 (10/28 0543) SpO2:  [100 %] 100 % (10/28 0543) Last BM Date: 09/05/16  Intake/Output from previous day: 10/27 0701 - 10/28 0700 In: 1971.3 [P.O.:680; I.V.:1291.3] Out: 2780 [Urine:2050; Stool:730] Intake/Output this shift: Total I/O In: 120 [P.O.:120] Out: 250 [Urine:200; Stool:50]  PE: General- In NAD Abdomen-soft, midline wound is clean, stoma pink and functioning  Lab Results:   Recent Labs  09/05/16 0445  WBC 11.7*  HGB 11.0*  HCT 32.5*  PLT 567*   BMET  Recent Labs  09/04/16 0500 09/05/16 0445  NA 136 136  K 3.5 3.6  CL 102 103  CO2 26 26  GLUCOSE 109* 97  BUN 18 23*  CREATININE 0.90 0.93  CALCIUM 8.8* 9.0   PT/INR No results for input(s): LABPROT, INR in the last 72 hours. Comprehensive Metabolic Panel:    Component Value Date/Time   NA 136 09/05/2016 0445   NA 136 09/04/2016 0500   NA 139 07/31/2015 1001   K 3.6 09/05/2016 0445   K 3.5 09/04/2016 0500   CL 103 09/05/2016 0445   CL 102 09/04/2016 0500   CO2 26 09/05/2016 0445   CO2 26 09/04/2016 0500   BUN 23 (H) 09/05/2016 0445   BUN 18 09/04/2016 0500   BUN 20 07/31/2015 1001   CREATININE 0.93 09/05/2016 0445   CREATININE 0.90 09/04/2016 0500   GLUCOSE 97 09/05/2016 0445   GLUCOSE 109 (H) 09/04/2016 0500   CALCIUM 9.0 09/05/2016 0445   CALCIUM 8.8 (L) 09/04/2016 0500   AST 23 09/03/2016 0500   AST 32 08/31/2016 0410   ALT 20 09/03/2016 0500   ALT 40 08/31/2016 0410   ALKPHOS 97 09/03/2016 0500   ALKPHOS 91 08/31/2016 0410   BILITOT 0.7 09/03/2016 0500   BILITOT 0.6 08/31/2016 0410   BILITOT 0.4 07/31/2015 1001   PROT 6.7 09/03/2016 0500   PROT 6.6 08/31/2016 0410   PROT 6.7 07/31/2015 1001   ALBUMIN 3.0  (L) 09/03/2016 0500   ALBUMIN 3.2 (L) 08/31/2016 0410   ALBUMIN 3.9 07/31/2015 1001     Studies/Results: No results found.  Anti-infectives: Anti-infectives    Start     Dose/Rate Route Frequency Ordered Stop   08/20/16 2200  cefoTEtan (CEFOTAN) 2 g in dextrose 5 % 50 mL IVPB     2 g 100 mL/hr over 30 Minutes Intravenous Every 12 hours 08/20/16 1330 08/20/16 2234   08/20/16 0716  cefoTEtan in Dextrose 5% (CEFOTAN) IVPB 2 g     2 g Intravenous On call to O.R. 08/20/16 0716 08/20/16 0916      Assessment S/p ileostomy relocation and postop partial sbo-doing much better and ready for discharge.   LOS: 16 days   Plan: Discharge today.   Tomia Enlow J 09/05/2016

## 2016-09-07 NOTE — Discharge Summary (Signed)
Patient ID: SHALEKA BRINES [id number] 60 y.o. [birth year]  08/20/2016  Discharge date and time: 09/05/2016 11:36 AM  Admitting Physician: Rosario Adie  Discharge Physician: Rosario Adie.  Admission Diagnoses: ileostomy dysfunction  Discharge Diagnoses: ileostomy dysfunction, post op SBO  Operations: Procedure(s): OPEN RELOCATION OF ILEOSTOMY    Discharged Condition: good    Hospital Course: patient was admitted after surgery.  Her diet was advanced as tolerated.  On postop day 3 she began to develop some nausea.  By postop day 4 she had a complete lack of ileostomy output.  A plain film revealed an area of hard stool at the level of the ostomy.  The ostomy was irrigated but no significant output was achieved.  The patient continued to become nauseous and requested an NG tube.  This was placed.  A CT scan showed no signs of intra-abdominal pathology except for a bowel obstruction.  NG was kept until she began to have bowel function again.  She was placed on TPN through a PICC line at this time.  Once she had decreasing NG output, this was removed.  Her diet was advanced very slowly.  Once she was tolerating a diet appropriately her PICC line and TPN were discontinued.  She was discharged home in stable condition.  Consults: None  Significant Diagnostic Studies: labs: cbc, chemistries  Treatments: IV hydration, analgesia: acetaminophen w/ codeine, TPN and surgery: ileostomy relocation  Disposition: Home

## 2016-09-18 ENCOUNTER — Encounter: Payer: Self-pay | Admitting: Internal Medicine

## 2016-09-23 ENCOUNTER — Other Ambulatory Visit: Payer: Self-pay | Admitting: Internal Medicine

## 2016-09-23 ENCOUNTER — Ambulatory Visit
Admission: RE | Admit: 2016-09-23 | Discharge: 2016-09-23 | Disposition: A | Discharge status: Home / Self Care | Payer: 59 | Source: Ambulatory Visit | Attending: Internal Medicine | Admitting: Internal Medicine

## 2016-09-23 ENCOUNTER — Encounter: Payer: Self-pay | Admitting: Internal Medicine

## 2016-09-23 DIAGNOSIS — E041 Nontoxic single thyroid nodule: Secondary | ICD-10-CM

## 2016-10-06 DIAGNOSIS — H9311 Tinnitus, right ear: Secondary | ICD-10-CM | POA: Insufficient documentation

## 2016-10-06 DIAGNOSIS — R42 Dizziness and giddiness: Secondary | ICD-10-CM | POA: Insufficient documentation

## 2016-10-06 DIAGNOSIS — H9203 Otalgia, bilateral: Secondary | ICD-10-CM | POA: Insufficient documentation

## 2016-10-26 ENCOUNTER — Telehealth: Payer: Self-pay

## 2016-10-26 NOTE — Telephone Encounter (Signed)
Received fax from [facility] regarding pts long term disability. Left message for pt to return call. Our office does not complete these forms for pt. Pt will need to go through [employer] located on [address]. Their telephone number is [phone]. Pt will be charged $25 dollars and it will need to be in the form of a check. Letter that was received was sent through inner office mail to [employer].

## 2016-10-28 ENCOUNTER — Telehealth: Payer: Self-pay | Admitting: Internal Medicine

## 2016-10-28 NOTE — Telephone Encounter (Signed)
Patient called to advise that we will be receiving long term disability paperwork. Advised that I would let them know. She states to reach out to her with questions @ [phone]

## 2016-10-29 ENCOUNTER — Other Ambulatory Visit: Payer: Self-pay | Admitting: Otolaryngology

## 2016-10-30 ENCOUNTER — Other Ambulatory Visit: Payer: Self-pay | Admitting: Otolaryngology

## 2016-10-30 DIAGNOSIS — H918X9 Other specified hearing loss, unspecified ear: Secondary | ICD-10-CM

## 2016-11-08 ENCOUNTER — Ambulatory Visit
Admission: RE | Admit: 2016-11-08 | Discharge: 2016-11-08 | Disposition: A | Discharge status: Home / Self Care | Payer: 59 | Source: Ambulatory Visit | Attending: Otolaryngology | Admitting: Otolaryngology

## 2016-11-08 DIAGNOSIS — H918X9 Other specified hearing loss, unspecified ear: Secondary | ICD-10-CM

## 2016-11-08 MED ORDER — GADOBENATE DIMEGLUMINE 529 MG/ML IV SOLN
15.0000 mL | Freq: Once | INTRAVENOUS | Status: AC | PRN
  Administered 2016-11-08: 15 mL via INTRAVENOUS

## 2016-11-16 ENCOUNTER — Other Ambulatory Visit: Payer: 59

## 2016-11-25 IMAGING — DX DG ABDOMEN 2V
3 series · 3 of 3 positions shown · non-contrast
Comparison: 08/28/2016

CLINICAL DATA: Follow-up bowel obstruction

EXAM:
ABDOMEN - 2 VIEW

[abdomen erect]
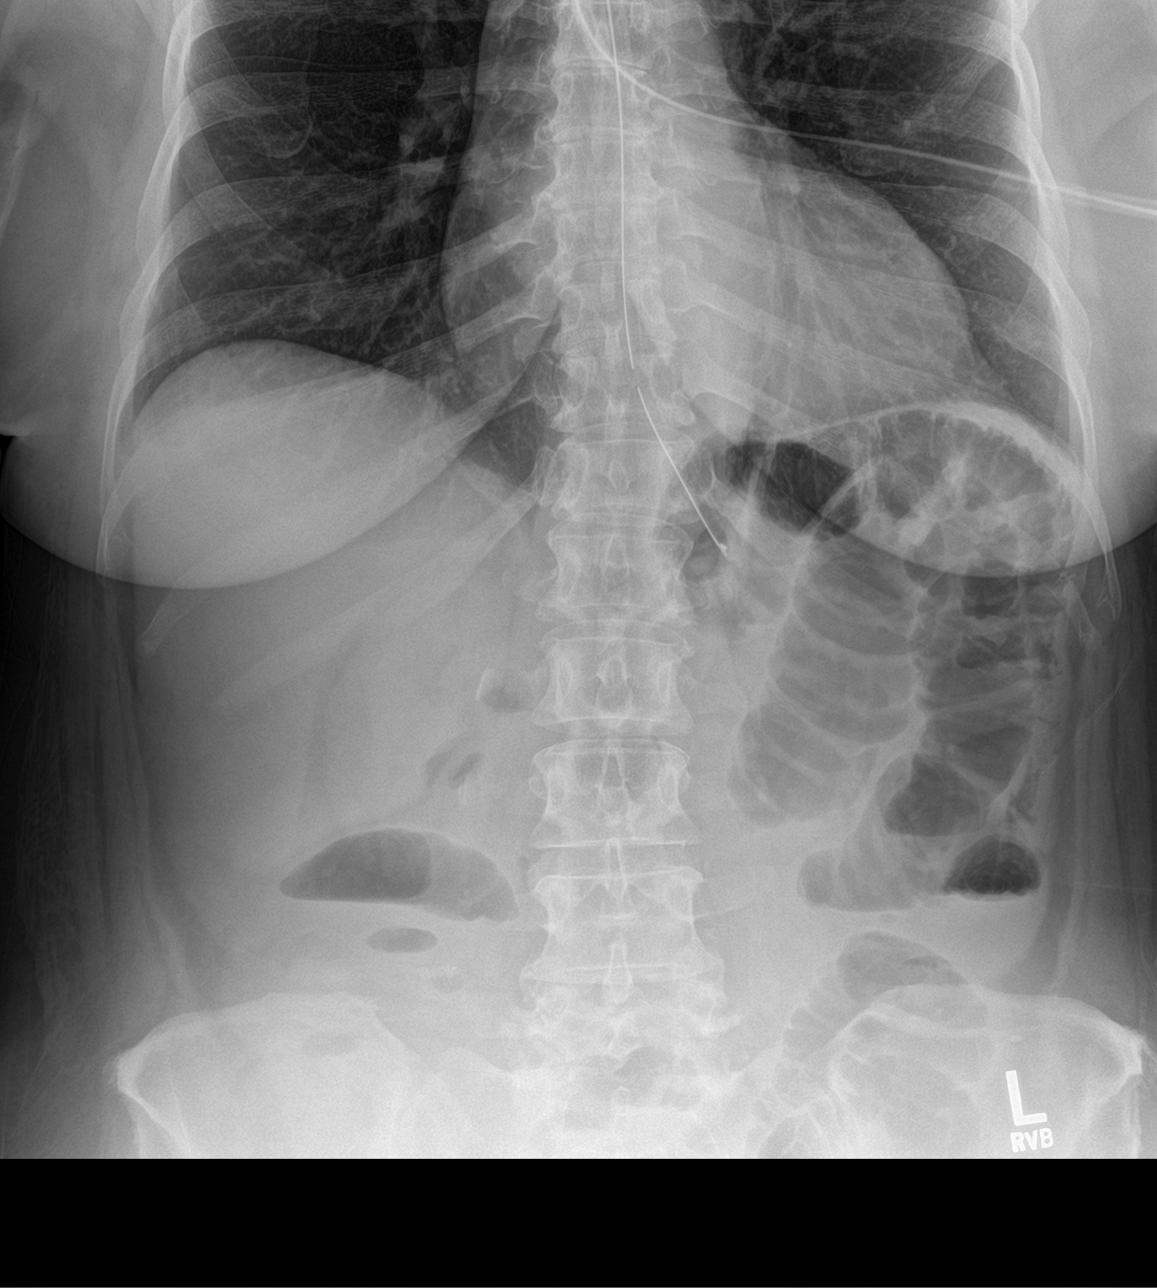

[abdomen supine (1 of 2)]
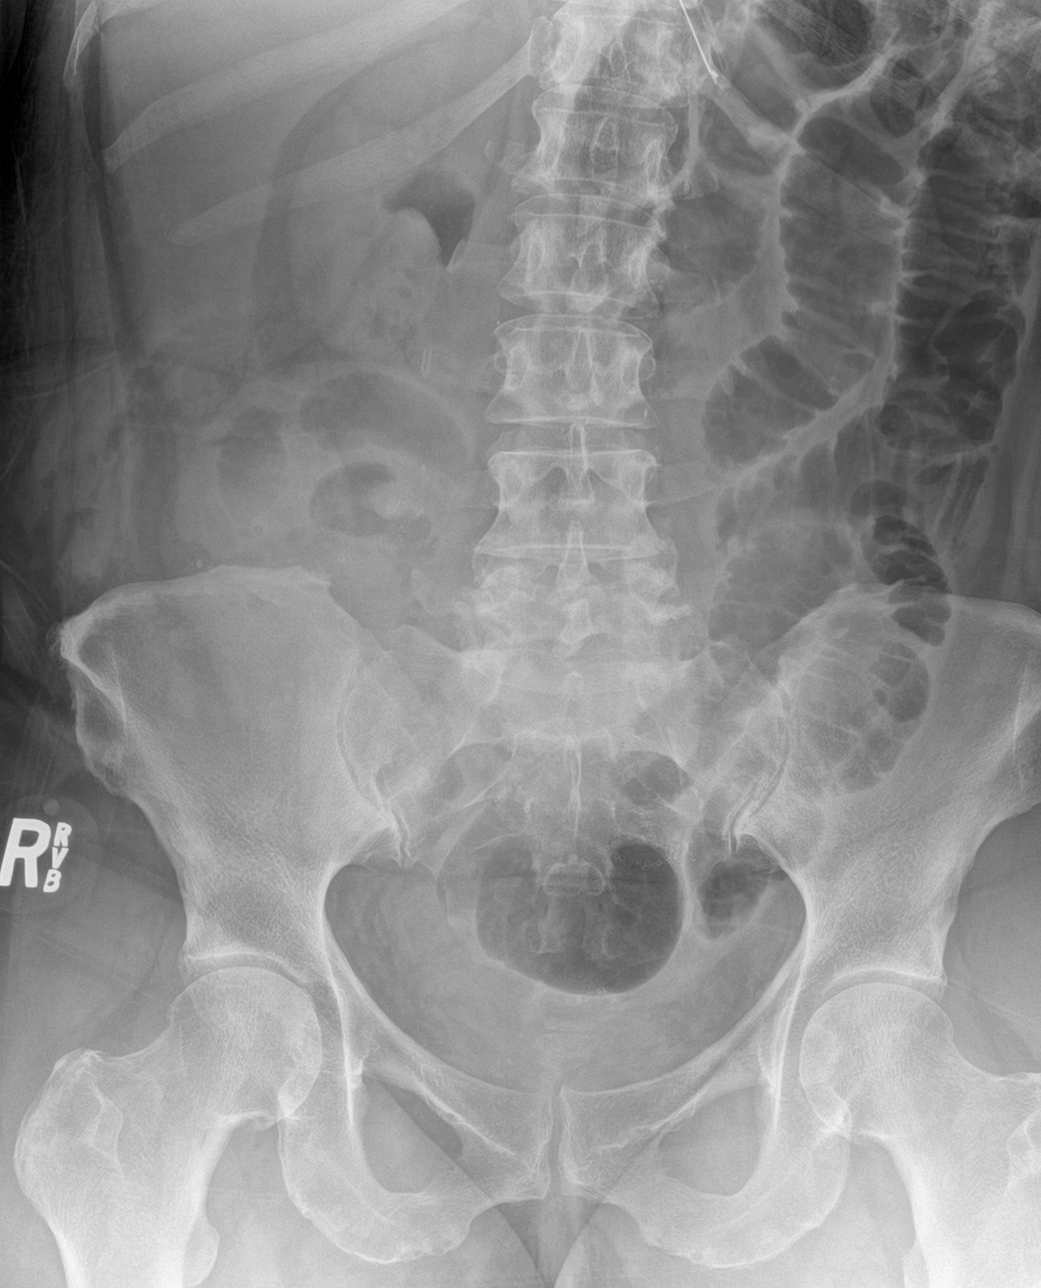

[abdomen supine (2 of 2)]
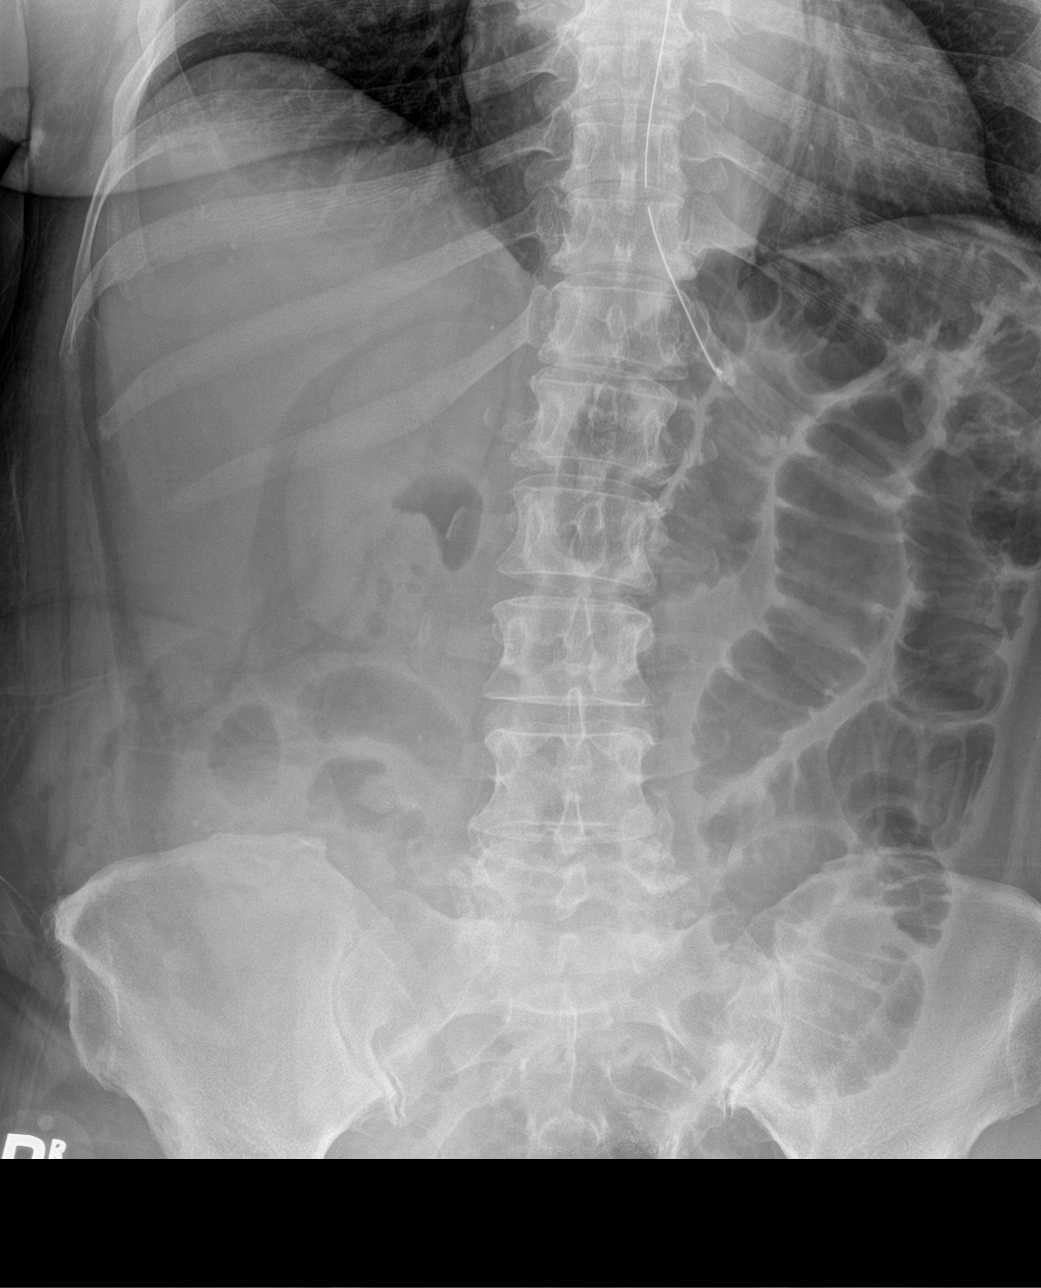

[3 of 3 positions shown; findings below may reference images not displayed]

FINDINGS: Persistent gaseous distended small bowel loops with air-fluid level
consistent with small bowel obstruction. NG tube with tip in
proximal stomach. Please note the side hole is in distal esophagus.
IMPRESSION: Persistent gaseous distended small bowel loops with air-fluid level
consistent with small bowel obstruction. NG tube with tip in
proximal stomach. Please note the side hole is in distal esophagus.

## 2016-11-26 ENCOUNTER — Telehealth: Payer: Self-pay

## 2016-11-26 NOTE — Telephone Encounter (Signed)
LTD received and completed. Contacted pt for verification of dates, etc.   Form placed on PCP desk for review and signature.

## 2016-11-27 IMAGING — US IR FLUORO GUIDE CV LINE*R*
1 series · 2 of 2 positions shown · IV contrast (agent unspecified)
Comparison: none

INDICATION: Protein calorie malnutrition.  Request PICC line placement for KERU

EXAM:
ULTRASOUND AND FLUOROSCOPIC GUIDED PICC LINE INSERTION
MEDICATIONS:
None.
CONTRAST:  None
FLUOROSCOPY TIME:  1 minutes, 18 seconds
COMPLICATIONS:
None immediate.
TECHNIQUE: The procedure, risks, benefits, and alternatives were explained to
the patient and informed written consent was obtained. A timeout was
performed prior to the initiation of the procedure.

[Series 1: ir fluoro/shunt/fist · 2 of 2 slices shown]
[im 1/2]
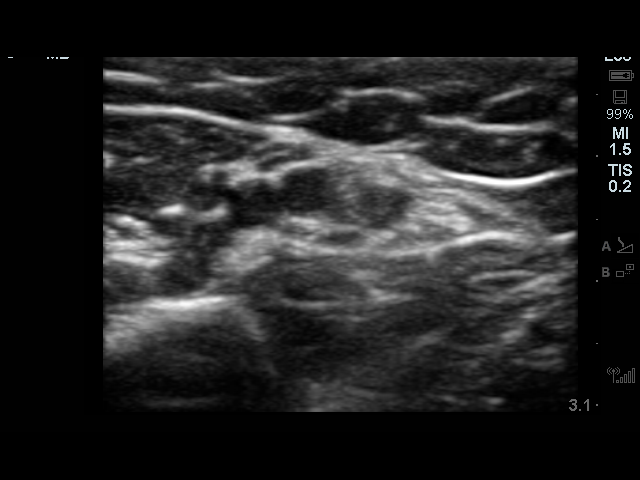
[im 2/2]
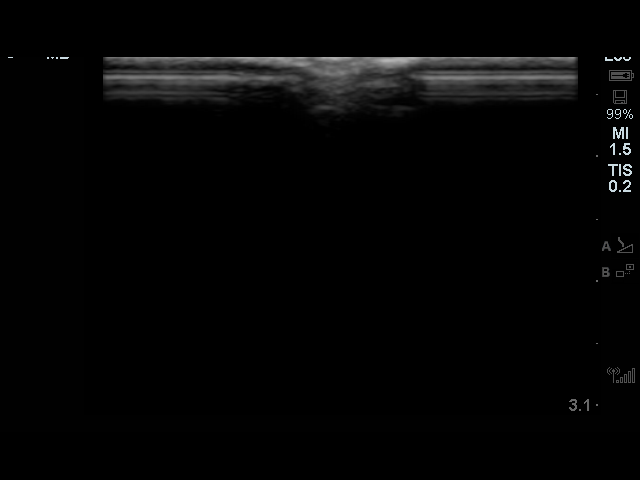

[2 of 2 positions shown; findings below may reference images not displayed]

The right upper extremity was prepped with chlorhexidine in a
sterile fashion, and a sterile drape was applied covering the
operative field. Maximum barrier sterile technique with sterile
gowns and gloves were used for the procedure. A timeout was
performed prior to the initiation of the procedure. Local anesthesia
was provided with 1% lidocaine.

Under direct ultrasound guidance, the right brachial vein was
accessed with a micropuncture kit after the overlying soft tissues
were anesthetized with 1% lidocaine. An ultrasound image was saved
for documentation purposes. A guidewire was advanced to the level of
the superior caval-atrial junction for measurement purposes and the
PICC line was cut to length. A peel-away sheath was placed and a 35
cm, 5 [place], dual lumen was inserted to level of the superior
caval-atrial junction. A post procedure spot fluoroscopic was
obtained. The catheter easily aspirated and flushed and was sutured
in place. A dressing was placed. The patient tolerated the procedure
well without immediate post procedural complication.
FINDINGS: After catheter placement, the tip lies within the superior
cavoatrial junction. The catheter aspirates and flushes normally and
is ready for immediate use.
IMPRESSION: Successful ultrasound and fluoroscopic guided placement of a bright
brachial vein approach, 35 cm, 5 French, dual lumen PICC with tip at
the superior caval-atrial junction. The PICC line is ready for
immediate use.

## 2016-11-30 NOTE — Telephone Encounter (Signed)
Pt informed paper work is ready for pick up.

## 2016-12-09 ENCOUNTER — Other Ambulatory Visit: Payer: 59

## 2016-12-21 ENCOUNTER — Other Ambulatory Visit: Payer: Self-pay | Admitting: Internal Medicine

## 2016-12-21 DIAGNOSIS — F419 Anxiety disorder, unspecified: Secondary | ICD-10-CM

## 2016-12-22 NOTE — Telephone Encounter (Signed)
rx faxed to pharmacy

## 2017-02-03 IMAGING — MR MR HEAD WO/W CM
10 of 11 series · 44 of 48 positions shown · IV contrast (multihance)
Comparison: Head CT without contrast [REDACTED] 05/04/2005

CLINICAL DATA: 60-year-old female with asymmetric hearing loss.
Dizziness and ringing in the right ear for 2 months. No known
injury. Initial encounter.

Creatinine was obtained on site at [HOSPITAL] at [HOSPITAL].
Results: Creatinine 1.0 mg/dL.
EXAM:
MRI HEAD WITHOUT AND WITH CONTRAST
TECHNIQUE: Multiplanar, multiecho pulse sequences of the brain and surrounding
structures were obtained without and with intravenous contrast.
CONTRAST:  15mL MULTIHANCE GADOBENATE DIMEGLUMINE 529 MG/ML IV SOLN

[Series 5: T1 · sagittal · 4.0mm · 0.72mm/px · 4 of 26 slices shown (1 of 3)]
[im 1/26]
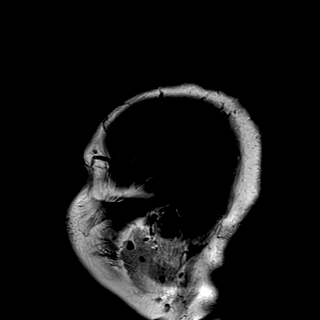
[im 9/26]
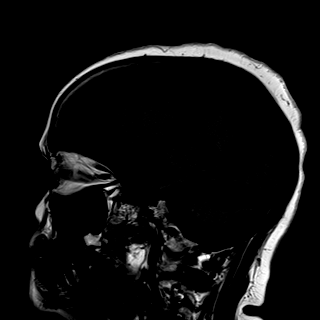
[im 17/26]
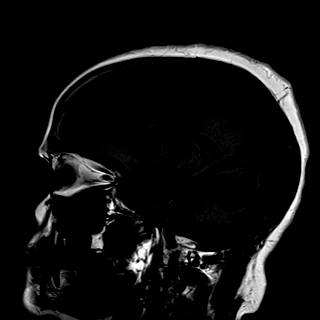
[im 26/26]
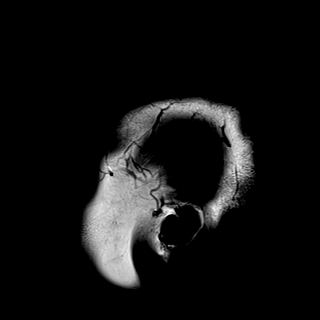

[Series 6: T2 · axial · 4.0mm · 0.36mm/px · z∈[-77,+57]mm · 3 of 27 slices shown]
[im 1/27]
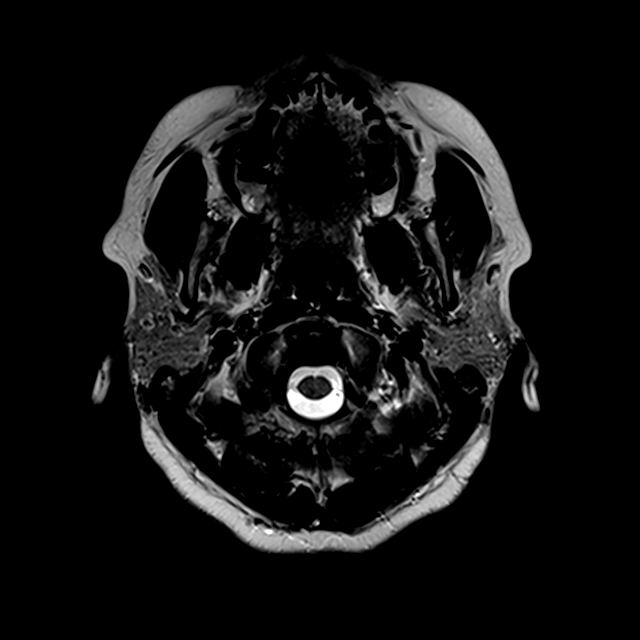
[im 14/27]
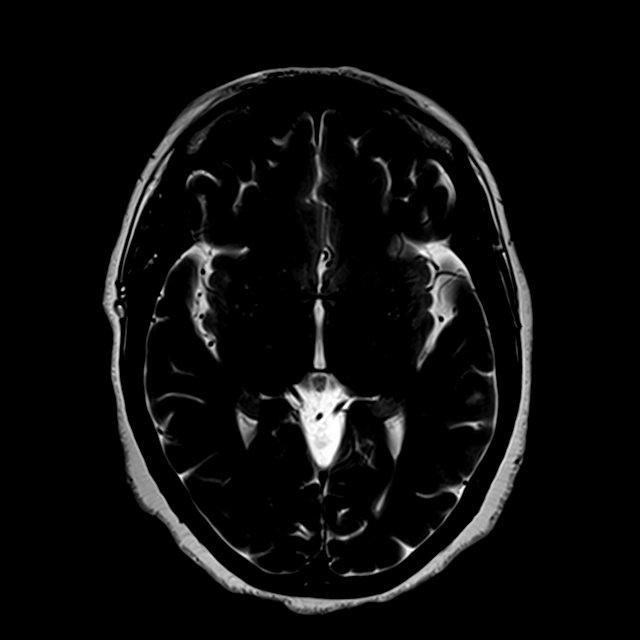
[im 27/27]
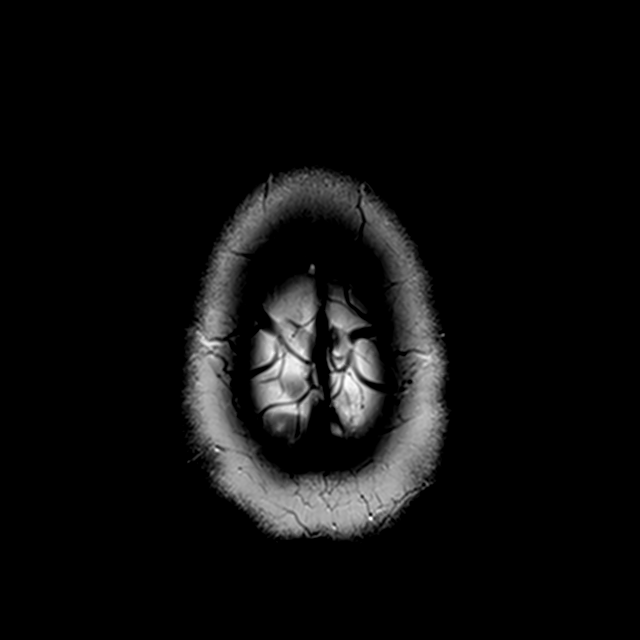

[Series 7: DWI · axial · 3.0mm · 1.44mm/px · z∈[-77,+57]mm · 9 of 84 slices shown (1 of 2)]
[im 1/84]
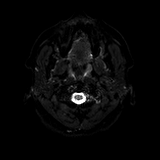
[im 11/84]
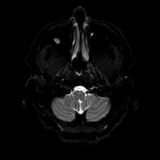
[im 21/84]
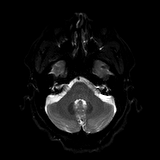
[im 32/84]
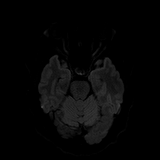
[im 42/84]
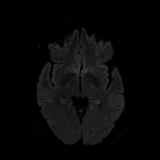
[im 52/84]
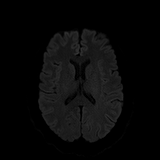
[im 63/84]
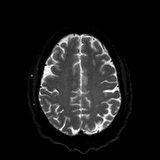
[im 73/84]
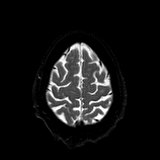
[im 84/84]
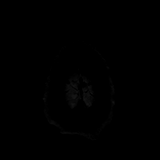

[Series 8: DWI · axial · 3.0mm · 1.44mm/px · z∈[-77,+57]mm · 4 of 42 slices shown (2 of 2)]
[im 1/42]
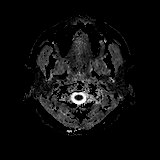
[im 14/42]
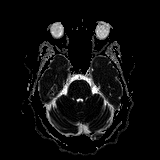
[im 28/42]
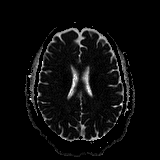
[im 42/42]
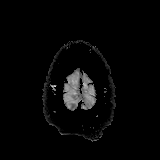

[Series 9: T1 · coronal · 2.5mm · 0.56mm/px · 1 of 13 slices shown (2 of 3)]
[im 1/13]
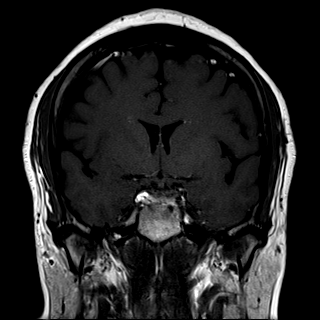

[Series 10: FLAIR · axial · 4.0mm · 0.72mm/px · z∈[-75,+59]mm · 3 of 27 slices shown]
[im 1/27]
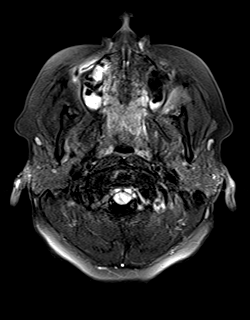
[im 14/27]
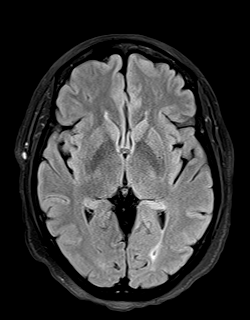
[im 27/27]
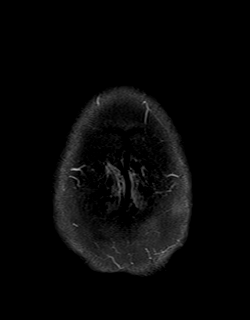

[Series 11: T1 · axial · 2.5mm · 0.50mm/px · 1 of 13 slices shown (3 of 3)]
[im 1/13]
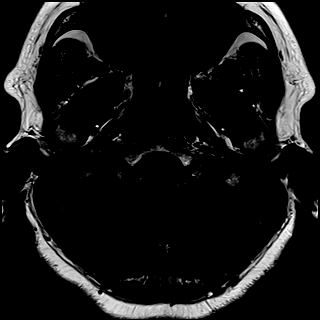

[Series 13: T1 post-contrast · coronal · 2.5mm · 0.56mm/px · 1 of 13 slices shown (1 of 3)]
[im 1/13]
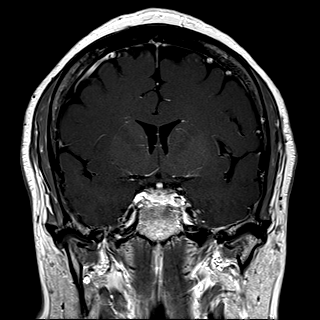

[Series 14: T1 post-contrast · axial · 2.5mm · 0.50mm/px · 1 of 13 slices shown (2 of 3)]
[im 1/13]
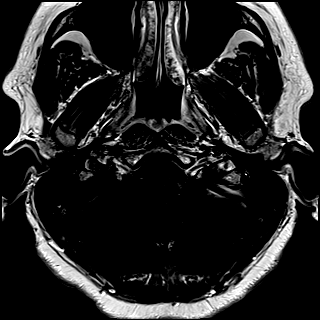

[Series 15: T1 post-contrast · axial · 1.0mm · 0.90mm/px · z∈[-87,+71]mm · 17 of 160 slices shown (3 of 3)]
[im 1/160]
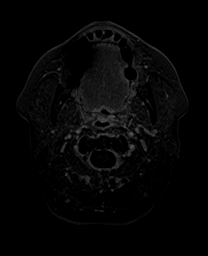
[im 10/160]
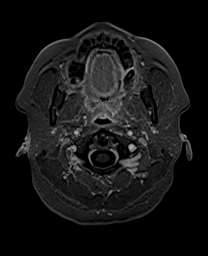
[im 20/160]
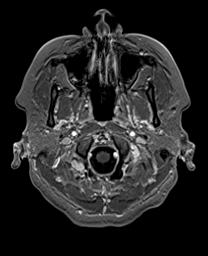
[im 30/160]
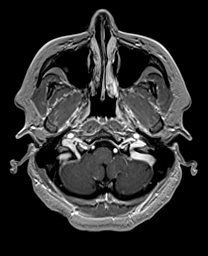
[im 40/160]
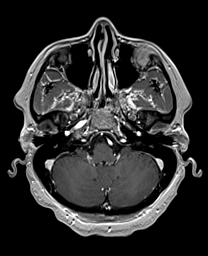
[im 50/160]
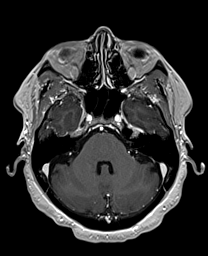
[im 60/160]
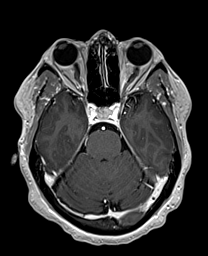
[im 70/160]
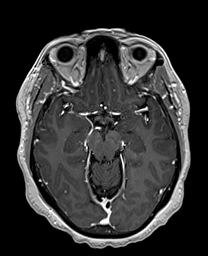
[im 80/160]
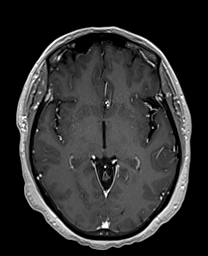
[im 90/160]
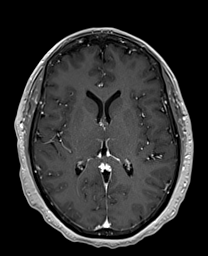
[im 100/160]
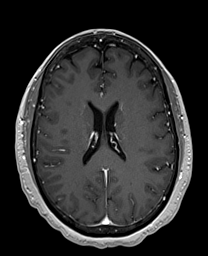
[im 110/160]
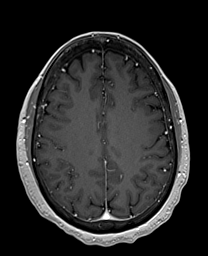
[im 120/160]
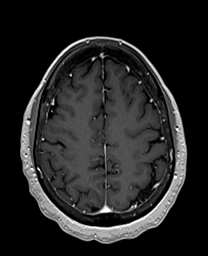
[im 130/160]
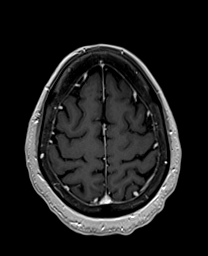
[im 140/160]
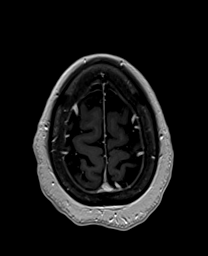
[im 150/160]
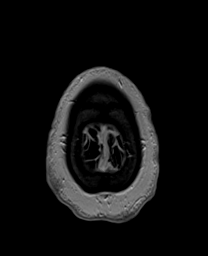
[im 160/160]
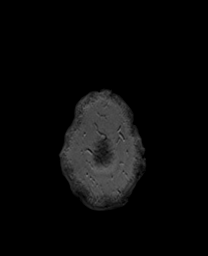

[44 of 48 positions shown; findings below may reference images not displayed]

FINDINGS: Brain: Cerebral volume is within normal limits for age. No
restricted diffusion to suggest acute infarction. No midline shift,
mass effect, evidence of mass lesion, ventriculomegaly, extra-axial
collection or acute intracranial hemorrhage. Cervicomedullary
junction and pituitary are within normal limits. Gray and white
matter signal is within normal limits for age throughout the brain.
No abnormal enhancement identified. No dural thickening.

Vascular: Major intracranial vascular flow voids are preserved in
appear normal, dominant distal left vertebral artery.

Skull and upper cervical spine: Negative. Normal bone marrow signal.

Sinuses/Orbits: Orbits soft tissues appear normal. Paranasal sinuses
are clear aside from small maxillary sinus retention cysts (series
6, image 3). Negative scalp soft tissues.

Other: Dedicated internal auditory canal imaging. Normal
cerebellopontine angles. Normal bilateral cisternal and
intracanalicular 7th and 8th cranial nerve segments. Preserved T2
signal in the bilateral cochlea and vestibular structures. No
abnormal enhancement identified. Mastoids are clear. Normal
stylomastoid foramina. No skullbase abnormality.
IMPRESSION: 1. Negative IAC imaging.
2. Normal MRI appearance of the brain.

## 2017-04-29 ENCOUNTER — Other Ambulatory Visit (INDEPENDENT_AMBULATORY_CARE_PROVIDER_SITE_OTHER): Payer: Managed Care, Other (non HMO)

## 2017-04-29 ENCOUNTER — Other Ambulatory Visit: Payer: Managed Care, Other (non HMO)

## 2017-04-29 ENCOUNTER — Ambulatory Visit (INDEPENDENT_AMBULATORY_CARE_PROVIDER_SITE_OTHER): Payer: Managed Care, Other (non HMO) | Admitting: Internal Medicine

## 2017-04-29 ENCOUNTER — Encounter: Payer: Self-pay | Admitting: Internal Medicine

## 2017-04-29 VITALS — BP 122/80 | HR 83 | Temp 98.8°F | Resp 16 | Ht 64.0 in | Wt 182.0 lb

## 2017-04-29 DIAGNOSIS — I10 Essential (primary) hypertension: Secondary | ICD-10-CM

## 2017-04-29 DIAGNOSIS — E118 Type 2 diabetes mellitus with unspecified complications: Secondary | ICD-10-CM | POA: Diagnosis not present

## 2017-04-29 DIAGNOSIS — R7303 Prediabetes: Secondary | ICD-10-CM

## 2017-04-29 DIAGNOSIS — E785 Hyperlipidemia, unspecified: Secondary | ICD-10-CM

## 2017-04-29 DIAGNOSIS — D510 Vitamin B12 deficiency anemia due to intrinsic factor deficiency: Secondary | ICD-10-CM

## 2017-04-29 LAB — LIPID PANEL
Cholesterol: 246 mg/dL — ABNORMAL HIGH (ref 0–200)
HDL: 85.2 mg/dL (ref >39.00)
NONHDL: 161.23
Total CHOL/HDL Ratio: 3
Triglycerides: 277 mg/dL — ABNORMAL HIGH (ref 0.0–149.0)
VLDL: 55.4 mg/dL — AB (ref 0.0–40.0)

## 2017-04-29 LAB — THYROID PANEL WITH TSH
FREE THYROXINE INDEX: 2 (ref 1.4–3.8)
T3 Uptake: 28 % (ref 22–35)
T4 TOTAL: 7.1 ug/dL (ref 4.5–12.0)
TSH: 0.87 m[IU]/L

## 2017-04-29 LAB — CBC WITH DIFFERENTIAL/PLATELET
BASOS PCT: 0.6 % (ref 0.0–3.0)
Basophils Absolute: 0 10*3/uL (ref 0.0–0.1)
Eosinophils Absolute: 0.2 10*3/uL (ref 0.0–0.7)
Eosinophils Relative: 3.1 % (ref 0.0–5.0)
HEMATOCRIT: 39.7 % (ref 36.0–46.0)
Hemoglobin: 13.1 g/dL (ref 12.0–15.0)
LYMPHS ABS: 2.1 10*3/uL (ref 0.7–4.0)
LYMPHS PCT: 27.6 % (ref 12.0–46.0)
MCHC: 33 g/dL (ref 30.0–36.0)
MCV: 86.6 fl (ref 78.0–100.0)
MONOS PCT: 5.6 % (ref 3.0–12.0)
Monocytes Absolute: 0.4 10*3/uL (ref 0.1–1.0)
NEUTROS ABS: 4.7 10*3/uL (ref 1.4–7.7)
NEUTROS PCT: 63.1 % (ref 43.0–77.0)
PLATELETS: 347 10*3/uL (ref 150.0–400.0)
RBC: 4.58 Mil/uL (ref 3.87–5.11)
RDW: 13.9 % (ref 11.5–15.5)
WBC: 7.5 10*3/uL (ref 4.0–10.5)

## 2017-04-29 LAB — LDL CHOLESTEROL, DIRECT: LDL DIRECT: 114 mg/dL

## 2017-04-29 LAB — FERRITIN: FERRITIN: 57.1 ng/mL (ref 10.0–291.0)

## 2017-04-29 LAB — COMPREHENSIVE METABOLIC PANEL
ALT: 15 U/L (ref 0–35)
AST: 16 U/L (ref 0–37)
Albumin: 3.9 g/dL (ref 3.5–5.2)
Alkaline Phosphatase: 102 U/L (ref 39–117)
BUN: 15 mg/dL (ref 6–23)
CHLORIDE: 100 meq/L (ref 96–112)
CO2: 29 meq/L (ref 19–32)
Calcium: 9.7 mg/dL (ref 8.4–10.5)
Creatinine, Ser: 1.01 mg/dL (ref 0.40–1.20)
GFR: 71.39 mL/min (ref >60.00)
GLUCOSE: 98 mg/dL (ref 70–99)
POTASSIUM: 4 meq/L (ref 3.5–5.1)
Sodium: 136 mEq/L (ref 135–145)
Total Bilirubin: 0.4 mg/dL (ref 0.2–1.2)
Total Protein: 7.3 g/dL (ref 6.0–8.3)

## 2017-04-29 LAB — IBC PANEL
Iron: 62 ug/dL (ref 42–145)
Saturation Ratios: 15.6 % — ABNORMAL LOW (ref 20.0–50.0)
Transferrin: 283 mg/dL (ref 212.0–360.0)

## 2017-04-29 LAB — POCT GLYCOSYLATED HEMOGLOBIN (HGB A1C): HEMOGLOBIN A1C: 5.7

## 2017-04-29 LAB — VITAMIN B12: VITAMIN B 12: 347 pg/mL (ref 211–911)

## 2017-04-29 LAB — FOLATE: Folate: 22.8 ng/mL (ref >5.9)

## 2017-04-29 MED ORDER — OLMESARTAN MEDOXOMIL-HCTZ 40-12.5 MG PO TABS: 1.0000 | ORAL_TABLET | Freq: Every day | ORAL | 1 refills | Status: DC

## 2017-04-29 NOTE — Patient Instructions (Signed)
Anemia, Nonspecific Anemia is a condition in which the concentration of red blood cells or hemoglobin in the blood is below normal. Hemoglobin is a substance in red blood cells that carries oxygen to the tissues of the body. Anemia results in not enough oxygen reaching these tissues. What are the causes? Common causes of anemia include:  Excessive bleeding. Bleeding may be internal or external. This includes excessive bleeding from periods (in women) or from the intestine.  Poor nutrition.  Chronic kidney, thyroid, and liver disease.  Bone marrow disorders that decrease red blood cell production.  Cancer and treatments for cancer.  HIV, AIDS, and their treatments.  Spleen problems that increase red blood cell destruction.  Blood disorders.  Excess destruction of red blood cells due to infection, medicines, and autoimmune disorders. What are the signs or symptoms?  Minor weakness.  Dizziness.  Headache.  Palpitations.  Shortness of breath, especially with exercise.  Paleness.  Cold sensitivity.  Indigestion.  Nausea.  Difficulty sleeping.  Difficulty concentrating. Symptoms may occur suddenly or they may develop slowly. How is this diagnosed? Additional blood tests are often needed. These help your health care provider determine the best treatment. Your health care provider will check your stool for blood and look for other causes of blood loss. How is this treated? Treatment varies depending on the cause of the anemia. Treatment can include:  Supplements of iron, vitamin B12, or folic acid.  Hormone medicines.  A blood transfusion. This may be needed if blood loss is severe.  Hospitalization. This may be needed if there is significant continual blood loss.  Dietary changes.  Spleen removal. Follow these instructions at home: Keep all follow-up appointments. It often takes many weeks to correct anemia, and having your health care provider check on your  condition and your response to treatment is very important. Get help right away if:  You develop extreme weakness, shortness of breath, or chest pain.  You become dizzy or have trouble concentrating.  You develop heavy vaginal bleeding.  You develop a rash.  You have bloody or black, tarry stools.  You faint.  You vomit up blood.  You vomit repeatedly.  You have abdominal pain.  You have a fever or persistent symptoms for more than 2-3 days.  You have a fever and your symptoms suddenly get worse.  You are dehydrated. This information is not intended to replace advice given to you by your health care provider. Make sure you discuss any questions you have with your health care provider. Document Released: 12/03/2004 Document Revised: 04/08/2016 Document Reviewed: 04/21/2013 Elsevier Interactive Patient Education  2017 [employer]  

## 2017-04-29 NOTE — Progress Notes (Signed)
Subjective:  Patient ID: Latasha Wood, female    DOB: 1956-08-21  Age: 61 y.o. MRN: [id number]  CC: Anemia and Hypertension   HPI Latasha Wood presents for f/up - She is due for follow-up on anemia. She has noticed weight gain recently. She has had a lot of complications over last year with her ostomy site but for the last few months she has been treated by an ostomy nurse at [facility] and is doing much better. She complains of excessively pigmented skin around the ostomy site and a few months ago had some areas of redness and swelling but she tells me with their care of this has improved. She's had a few episodes of anxiety and fatigue but otherwise feels well.  Outpatient Medications Prior to Visit  Medication Sig Dispense Refill  . ALPRAZolam (XANAX) 0.25 MG tablet TAKE 1 TABLET BY MOUTH TWICE A DAY AS NEEDED FOR ANXIETY 60 tablet 3  . famotidine (PEPCID) 20 MG tablet Take 1 tablet (20 mg total) by mouth 2 (two) times daily as needed for heartburn or indigestion.    . fluticasone (FLONASE) 50 MCG/ACT nasal spray 3-4 sprays once a week. Around peristomal skin--hold 5-6 inches away from skin  2  . loperamide (IMODIUM) 2 MG capsule Take 1 capsule (2 mg total) by mouth 3 (three) times daily as needed for diarrhea or loose stools. 60 capsule 3  . omeprazole (PRILOSEC) 20 MG capsule Take 1 capsule (20 mg total) by mouth daily. (Patient taking differently: Take 20 mg by mouth daily as needed (for acid reflux.). ) 30 capsule 3  . ondansetron (ZOFRAN) 4 MG tablet Take 1 tablet (4 mg total) by mouth every 8 (eight) hours as needed for nausea or vomiting. 20 tablet 0  . acetaminophen (TYLENOL) 650 MG CR tablet Take 650 mg by mouth every 8 (eight) hours as needed for pain.    . hydrocortisone cream 1 % Apply topically as needed for itching. (Patient taking differently: Apply 1 application topically 2 (two) times daily as needed for itching. ) 30 g 0  . NYSTATIN powder APPLY POWDER TO AFFECTED  AREA THREE TIMES WEEKLY AROUND PERISTOMAL SKIN  3  . olmesartan-hydrochlorothiazide (BENICAR HCT) 40-12.5 MG tablet TAKE 1 TABLET BY MOUTH  DAILY 90 tablet 1  . traMADol (ULTRAM) 50 MG tablet Take 1 tablet (50 mg total) by mouth every 8 (eight) hours. (Patient taking differently: Take 50 mg by mouth every 8 (eight) hours as needed (for pain.). ) 65 tablet 5   No facility-administered medications prior to visit.     ROS Review of Systems  Constitutional: Positive for fatigue and unexpected weight change. Negative for activity change, appetite change, chills, diaphoresis and fever.  HENT: Negative.  Negative for trouble swallowing.   Eyes: Negative.  Negative for visual disturbance.  Respiratory: Negative for cough, chest tightness, shortness of breath and wheezing.   Cardiovascular: Negative.  Negative for chest pain, palpitations and leg swelling.  Gastrointestinal: Negative for abdominal pain, anal bleeding, blood in stool, diarrhea, nausea and vomiting.  Endocrine: Negative.   Genitourinary: Negative.  Negative for decreased urine volume, difficulty urinating, dysuria, hematuria and urgency.  Musculoskeletal: Negative.   Skin: Negative.  Negative for color change and rash.  Allergic/Immunologic: Negative.   Neurological: Negative.  Negative for dizziness, weakness and headaches.  Hematological: Negative for adenopathy. Does not bruise/bleed easily.  Psychiatric/Behavioral: Negative.     Objective:  BP 122/80 (BP Location: Left Arm, Patient Position: Sitting, Cuff Size:  Large)   Pulse 83   Temp 98.8 F (37.1 C) (Oral)   Resp 16   Ht 5' 4"  (1.626 m)   Wt 182 lb (82.6 kg)   SpO2 100%   BMI 31.24 kg/m   BP Readings from Last 3 Encounters:  04/29/17 122/80  09/05/16 123/70  08/17/16 124/70    Wt Readings from Last 3 Encounters:  04/29/17 182 lb (82.6 kg)  08/20/16 174 lb (78.9 kg)  08/17/16 174 lb (78.9 kg)    Physical Exam  Constitutional: She is oriented to person,  place, and time. No distress.  HENT:  Mouth/Throat: Oropharynx is clear and moist. No oropharyngeal exudate.  Eyes: Conjunctivae are normal. Right eye exhibits no discharge. Left eye exhibits no discharge. No scleral icterus.  Neck: Normal range of motion. Neck supple. No JVD present. No thyromegaly present.  Cardiovascular: Normal rate, regular rhythm and intact distal pulses.  Exam reveals no gallop and no friction rub.   No murmur heard. Pulmonary/Chest: Effort normal and breath sounds normal. No respiratory distress. She has no wheezes. She has no rales. She exhibits no tenderness.  Abdominal: Soft. Bowel sounds are normal. She exhibits no distension and no mass. There is no tenderness. There is no rebound and no guarding.    Musculoskeletal: Normal range of motion. She exhibits no edema, tenderness or deformity.  Lymphadenopathy:    She has no cervical adenopathy.  Neurological: She is alert and oriented to person, place, and time.  Skin: Skin is warm and dry. No rash noted. She is not diaphoretic. No erythema. No pallor.  Vitals reviewed.   Lab Results  Component Value Date   WBC 7.5 04/29/2017   HGB 13.1 04/29/2017   HCT 39.7 04/29/2017   PLT 347.0 04/29/2017   GLUCOSE 98 04/29/2017   CHOL 246 (H) 04/29/2017   TRIG 277.0 (H) 04/29/2017   HDL 85.20 04/29/2017   LDLDIRECT 114.0 04/29/2017   LDLCALC 92 08/17/2016   ALT 15 04/29/2017   AST 16 04/29/2017   NA 136 04/29/2017   K 4.0 04/29/2017   CL 100 04/29/2017   CREATININE 1.01 04/29/2017   BUN 15 04/29/2017   CO2 29 04/29/2017   TSH 0.87 04/29/2017   INR 1.16 12/11/2015   HGBA1C 5.7 04/29/2017   MICROALBUR 1.1 03/23/2016    Mr Brain W YC Contrast  Result Date: 11/08/2016 CLINICAL DATA:  61 year old female with asymmetric hearing loss. Dizziness and ringing in the right ear for 2 months. No known injury. Initial encounter. Creatinine was obtained on site at [place] at [address] Results:  Creatinine 1.0 mg/dL. EXAM: MRI HEAD WITHOUT AND WITH CONTRAST TECHNIQUE: Multiplanar, multiecho pulse sequences of the brain and surrounding structures were obtained without and with intravenous contrast. CONTRAST:  17m MULTIHANCE GADOBENATE DIMEGLUMINE 529 MG/ML IV SOLN COMPARISON:  Head CT [facility] FINDINGS: Brain: Cerebral volume is within normal limits for age. No restricted diffusion to suggest acute infarction. No midline shift, mass effect, evidence of mass lesion, ventriculomegaly, extra-axial collection or acute intracranial hemorrhage. Cervicomedullary junction and pituitary are within normal limits. GPearline Cablesand white matter signal is within normal limits for age throughout the brain. No abnormal enhancement identified. No dural thickening. Vascular: Major intracranial vascular flow voids are preserved in appear normal, dominant distal left vertebral artery. Skull and upper cervical spine: Negative. Normal bone marrow signal. Sinuses/Orbits: Orbits soft tissues appear normal. Paranasal sinuses are clear aside from small maxillary sinus retention cysts (  series 6, image 3). Negative scalp soft tissues. Other: Dedicated internal auditory canal imaging. Normal cerebellopontine angles. Normal bilateral cisternal and intracanalicular 7th and 8th cranial nerve segments. Preserved T2 signal in the bilateral cochlea and vestibular structures. No abnormal enhancement identified. Mastoids are clear. Normal stylomastoid foramina. No skullbase abnormality. IMPRESSION: 1. Negative IAC imaging. 2. Normal MRI appearance of the brain. Electronically Signed   By: Genevie Ann M.D.   On: 11/08/2016 13:41    Assessment & Plan:   Londa was seen today for anemia and hypertension.  Diagnoses and all orders for this visit:  Essential hypertension- her blood pressure is well-controlled, electrolytes and renal function are normal. -     Thyroid Panel With TSH; Future -      olmesartan-hydrochlorothiazide (BENICAR HCT) 40-12.5 MG tablet; Take 1 tablet by mouth daily.  Hyperlipidemia LDL goal <70- her Framingham risk score is only 3% so I do not recommend that she start taking a statin for CV risk reduction. -     Lipid panel; Future  Pernicious anemia- her H&H are normal now, her vitamin levels are all normal, will continue to monitor this in the future. -     CBC with Differential/Platelet; Future -     IBC panel; Future -     Vitamin B1; Future -     Folate; Future -     Ferritin; Future -     Vitamin B12; Future  Type 2 diabetes mellitus with complication, without long-term current use of insulin ([place])- her A1c is down to 5.7%, she is prediabetic,  medications are not needed to treat this. -     Comprehensive metabolic panel; Future -     POCT glycosylated hemoglobin (Hb A1C)  Prediabetes   I have discontinued Ms. Larue's acetaminophen, hydrocortisone cream, nystatin, and traMADol. I have also changed her olmesartan-hydrochlorothiazide. Additionally, I am having her maintain her omeprazole, fluticasone, famotidine, loperamide, ondansetron, and ALPRAZolam.  Meds ordered this encounter  Medications  . olmesartan-hydrochlorothiazide (BENICAR HCT) 40-12.5 MG tablet    Sig: Take 1 tablet by mouth daily.    Dispense:  90 tablet    Refill:  1     Follow-up: Return in about 4 months (around 08/29/2017).  Scarlette Calico, MD

## 2017-05-03 ENCOUNTER — Telehealth: Payer: Self-pay | Admitting: Internal Medicine

## 2017-05-03 ENCOUNTER — Encounter: Payer: Self-pay | Admitting: Internal Medicine

## 2017-05-03 LAB — VITAMIN B1: VITAMIN B1 (THIAMINE): 12 nmol/L (ref 8–30)

## 2017-05-03 NOTE — Telephone Encounter (Signed)
Can you advise on results and rx for skin condition

## 2017-05-03 NOTE — Telephone Encounter (Signed)
Pt would like a call back with her results from 6/21   Also she states Jones was supposed to put in some Cortisone cream for her skin condition , Please send to [facility] on [address]

## 2017-05-06 MED ORDER — HYDROCORTISONE 2.5 % EX CREA: TOPICAL_CREAM | Freq: Two times a day (BID) | CUTANEOUS | 0 refills | Status: DC

## 2017-05-06 NOTE — Telephone Encounter (Signed)
Can you help in PCP absence.

## 2017-05-06 NOTE — Telephone Encounter (Signed)
Hydrocortisone cream sent.

## 2017-05-06 NOTE — Telephone Encounter (Signed)
Called pt and informed that PCP will be out of the office until Monday.  Pt is requesting the 2.5% hydrocortisone cream/ointment to help treat the irritation at the ostomy site. There is a lot of irrigation due to the adhesives.

## 2017-05-06 NOTE — Telephone Encounter (Signed)
Pt called regarding her cream again

## 2017-05-06 NOTE — Telephone Encounter (Signed)
Results were sent via MyChart and there was no mention of a cream or rash in last office visit note.

## 2017-05-07 NOTE — Telephone Encounter (Signed)
Pt informed rx has been sent.  

## 2017-06-17 ENCOUNTER — Other Ambulatory Visit: Payer: Self-pay | Admitting: Internal Medicine

## 2017-06-17 DIAGNOSIS — F419 Anxiety disorder, unspecified: Secondary | ICD-10-CM

## 2017-06-22 NOTE — Telephone Encounter (Signed)
rx faxed to [facility].  

## 2017-09-07 ENCOUNTER — Encounter: Payer: Self-pay | Admitting: Internal Medicine

## 2017-09-07 ENCOUNTER — Other Ambulatory Visit (INDEPENDENT_AMBULATORY_CARE_PROVIDER_SITE_OTHER): Payer: Managed Care, Other (non HMO)

## 2017-09-07 ENCOUNTER — Ambulatory Visit (INDEPENDENT_AMBULATORY_CARE_PROVIDER_SITE_OTHER): Payer: Managed Care, Other (non HMO) | Admitting: Internal Medicine

## 2017-09-07 VITALS — BP 118/62 | HR 77 | Temp 98.5°F | Resp 16 | Ht 64.0 in | Wt 180.8 lb

## 2017-09-07 DIAGNOSIS — I10 Essential (primary) hypertension: Secondary | ICD-10-CM

## 2017-09-07 DIAGNOSIS — D510 Vitamin B12 deficiency anemia due to intrinsic factor deficiency: Secondary | ICD-10-CM | POA: Diagnosis not present

## 2017-09-07 DIAGNOSIS — K219 Gastro-esophageal reflux disease without esophagitis: Secondary | ICD-10-CM | POA: Diagnosis not present

## 2017-09-07 DIAGNOSIS — Z23 Encounter for immunization: Secondary | ICD-10-CM

## 2017-09-07 DIAGNOSIS — E041 Nontoxic single thyroid nodule: Secondary | ICD-10-CM | POA: Diagnosis not present

## 2017-09-07 LAB — CBC WITH DIFFERENTIAL/PLATELET
BASOS ABS: 0.1 10*3/uL (ref 0.0–0.1)
BASOS PCT: 0.8 % (ref 0.0–3.0)
EOS ABS: 0.2 10*3/uL (ref 0.0–0.7)
Eosinophils Relative: 2.9 % (ref 0.0–5.0)
HCT: 36.2 % (ref 36.0–46.0)
Hemoglobin: 12 g/dL (ref 12.0–15.0)
LYMPHS ABS: 2 10*3/uL (ref 0.7–4.0)
Lymphocytes Relative: 27.7 % (ref 12.0–46.0)
MCHC: 33.1 g/dL (ref 30.0–36.0)
MCV: 87.7 fl (ref 78.0–100.0)
MONO ABS: 0.5 10*3/uL (ref 0.1–1.0)
Monocytes Relative: 6.3 % (ref 3.0–12.0)
NEUTROS ABS: 4.5 10*3/uL (ref 1.4–7.7)
NEUTROS PCT: 62.3 % (ref 43.0–77.0)
PLATELETS: 331 10*3/uL (ref 150.0–400.0)
RBC: 4.13 Mil/uL (ref 3.87–5.11)
RDW: 13.8 % (ref 11.5–15.5)
WBC: 7.2 10*3/uL (ref 4.0–10.5)

## 2017-09-07 LAB — COMPREHENSIVE METABOLIC PANEL
ALT: 10 U/L (ref 0–35)
AST: 13 U/L (ref 0–37)
Albumin: 3.7 g/dL (ref 3.5–5.2)
Alkaline Phosphatase: 103 U/L (ref 39–117)
BILIRUBIN TOTAL: 0.6 mg/dL (ref 0.2–1.2)
BUN: 15 mg/dL (ref 6–23)
CHLORIDE: 104 meq/L (ref 96–112)
CO2: 28 meq/L (ref 19–32)
CREATININE: 0.93 mg/dL (ref 0.40–1.20)
Calcium: 9.4 mg/dL (ref 8.4–10.5)
GFR: 78.44 mL/min (ref >60.00)
GLUCOSE: 97 mg/dL (ref 70–99)
Potassium: 3.7 mEq/L (ref 3.5–5.1)
Sodium: 139 mEq/L (ref 135–145)
Total Protein: 7 g/dL (ref 6.0–8.3)

## 2017-09-07 LAB — THYROID PANEL WITH TSH
FREE THYROXINE INDEX: 2.3 (ref 1.4–3.8)
T3 UPTAKE: 29 % (ref 22–35)
T4 TOTAL: 7.8 ug/dL (ref 5.1–11.9)
TSH: 1.06 mIU/L (ref 0.40–4.50)

## 2017-09-07 MED ORDER — ZOSTER VAC RECOMB ADJUVANTED 50 MCG/0.5ML IM SUSR: 0.5000 mL | Freq: Once | INTRAMUSCULAR | 1 refills | Status: AC

## 2017-09-07 MED ORDER — ZOSTER VAC RECOMB ADJUVANTED 50 MCG/0.5ML IM SUSR: 0.5000 mL | Freq: Once | INTRAMUSCULAR | 1 refills | Status: DC

## 2017-09-07 NOTE — Patient Instructions (Signed)

## 2017-09-07 NOTE — Progress Notes (Signed)
Subjective:  Patient ID: Latasha Wood, female    DOB: 1956/09/07  Age: 61 y.o. MRN: [id number]  CC: Hypertension   HPI Latasha Wood presents for f/up -she complains of fatigue, lethargy, excessive sleepiness during the day, and soreness around the ostomy site.  Outpatient Medications Prior to Visit  Medication Sig Dispense Refill  . ALPRAZolam (XANAX) 0.25 MG tablet TAKE 1 TABLET TWICE A DAY AS NEEDED FOR ANXIETY 60 tablet 1  . famotidine (PEPCID) 20 MG tablet Take 1 tablet (20 mg total) by mouth 2 (two) times daily as needed for heartburn or indigestion.    . fluticasone (FLONASE) 50 MCG/ACT nasal spray 3-4 sprays once a week. Around peristomal skin--hold 5-6 inches away from skin  2  . hydrocortisone 2.5 % cream Apply topically 2 (two) times daily. 30 g 0  . loperamide (IMODIUM) 2 MG capsule Take 1 capsule (2 mg total) by mouth 3 (three) times daily as needed for diarrhea or loose stools. 60 capsule 3  . olmesartan-hydrochlorothiazide (BENICAR HCT) 40-12.5 MG tablet Take 1 tablet by mouth daily. 90 tablet 1  . omeprazole (PRILOSEC) 20 MG capsule Take 1 capsule (20 mg total) by mouth daily. (Patient taking differently: Take 20 mg by mouth daily as needed (for acid reflux.). ) 30 capsule 3  . ondansetron (ZOFRAN) 4 MG tablet Take 1 tablet (4 mg total) by mouth every 8 (eight) hours as needed for nausea or vomiting. 20 tablet 0   No facility-administered medications prior to visit.     ROS Review of Systems  Constitutional: Positive for fatigue. Negative for appetite change, chills, diaphoresis, fever and unexpected weight change.  HENT: Negative.  Negative for trouble swallowing.   Eyes: Negative for visual disturbance.  Respiratory: Negative.  Negative for cough, chest tightness, shortness of breath and wheezing.   Cardiovascular: Negative for chest pain, palpitations and leg swelling.  Gastrointestinal: Negative for abdominal pain, constipation, diarrhea, nausea and  vomiting.  Endocrine: Negative.   Genitourinary: Negative.  Negative for difficulty urinating.  Musculoskeletal: Negative.   Skin: Negative.  Negative for rash.  Allergic/Immunologic: Negative.   Neurological: Negative.  Negative for dizziness, weakness, numbness and headaches.  Hematological: Negative for adenopathy. Does not bruise/bleed easily.  Psychiatric/Behavioral: Negative for decreased concentration, dysphoric mood, sleep disturbance and suicidal ideas. The patient is nervous/anxious.     Objective:  BP 118/62 (BP Location: Left Arm, Patient Position: Sitting, Cuff Size: Normal)   Pulse 77   Temp 98.5 F (36.9 C) (Oral)   Resp 16   Ht 5' 4"  (1.626 m)   Wt 180 lb 12 oz (82 kg)   SpO2 96%   BMI 31.03 kg/m   BP Readings from Last 3 Encounters:  09/07/17 118/62  04/29/17 122/80  09/05/16 123/70    Wt Readings from Last 3 Encounters:  09/07/17 180 lb 12 oz (82 kg)  04/29/17 182 lb (82.6 kg)  08/20/16 174 lb (78.9 kg)    Physical Exam  Constitutional: She is oriented to person, place, and time. No distress.  HENT:  Mouth/Throat: Oropharynx is clear and moist. No oropharyngeal exudate.  Eyes: Conjunctivae are normal. Right eye exhibits no discharge. Left eye exhibits no discharge. No scleral icterus.  Neck: Normal range of motion. Neck supple. No JVD present. No thyromegaly present.  Cardiovascular: Normal rate, regular rhythm and intact distal pulses.  Exam reveals no gallop and no friction rub.   No murmur heard. Pulmonary/Chest: Effort normal and breath sounds normal. No respiratory distress.  She has no wheezes. She has no rales. She exhibits no tenderness.  Abdominal: Soft. Bowel sounds are normal. She exhibits no distension and no mass. There is no tenderness. There is no rebound and no guarding.    Musculoskeletal: Normal range of motion. She exhibits no edema, tenderness or deformity.  Lymphadenopathy:    She has no cervical adenopathy.  Neurological: She is  alert and oriented to person, place, and time.  Skin: Skin is warm and dry. No rash noted. She is not diaphoretic. No erythema. No pallor.  Vitals reviewed.   Lab Results  Component Value Date   WBC 7.2 09/07/2017   HGB 12.0 09/07/2017   HCT 36.2 09/07/2017   PLT 331.0 09/07/2017   GLUCOSE 97 09/07/2017   CHOL 246 (H) 04/29/2017   TRIG 277.0 (H) 04/29/2017   HDL 85.20 04/29/2017   LDLDIRECT 114.0 04/29/2017   LDLCALC 92 08/17/2016   ALT 10 09/07/2017   AST 13 09/07/2017   NA 139 09/07/2017   K 3.7 09/07/2017   CL 104 09/07/2017   CREATININE 0.93 09/07/2017   BUN 15 09/07/2017   CO2 28 09/07/2017   TSH 1.06 09/07/2017   INR 1.16 12/11/2015   HGBA1C 5.7 04/29/2017   MICROALBUR 1.1 03/23/2016    Mr Brain W Wo Contrast  Result Date: 11/08/2016 CLINICAL DATA:  61 year old female with asymmetric hearing loss. Dizziness and ringing in the right ear for 2 months. No known injury. Initial encounter. Creatinine was obtained on site at [place] at [address] Results: Creatinine 1.0 mg/dL. EXAM: MRI HEAD WITHOUT AND WITH CONTRAST TECHNIQUE: Multiplanar, multiecho pulse sequences of the brain and surrounding structures were obtained without and with intravenous contrast. CONTRAST:  59m MULTIHANCE GADOBENATE DIMEGLUMINE 529 MG/ML IV SOLN COM[facility]  [facility] FINDINGS: Brain: Cerebral volume is within normal limits for age. No restricted diffusion to suggest acute infarction. No midline shift, mass effect, evidence of mass lesion, ventriculomegaly, extra-axial collection or acute intracranial hemorrhage. Cervicomedullary junction and pituitary are within normal limits. GPearline Cablesand white matter signal is within normal limits for age throughout the brain. No abnormal enhancement identified. No dural thickening. Vascular: Major intracranial vascular flow voids are preserved in appear normal, dominant distal left vertebral  artery. Skull and upper cervical spine: Negative. Normal bone marrow signal. Sinuses/Orbits: Orbits soft tissues appear normal. Paranasal sinuses are clear aside from small maxillary sinus retention cysts (series 6, image 3). Negative scalp soft tissues. Other: Dedicated internal auditory canal imaging. Normal cerebellopontine angles. Normal bilateral cisternal and intracanalicular 7th and 8th cranial nerve segments. Preserved T2 signal in the bilateral cochlea and vestibular structures. No abnormal enhancement identified. Mastoids are clear. Normal stylomastoid foramina. No skullbase abnormality. IMPRESSION: 1. Negative IAC imaging. 2. Normal MRI appearance of the brain. Electronically Signed   By: HGenevie AnnM.D.   On: 11/08/2016 13:41    Assessment & Plan:   CRebekahwas seen today for hypertension.  Diagnoses and all orders for this visit:  Need for influenza vaccination -     Flu Vaccine QUAD 36+ mos IM  Essential hypertension- her blood pressure is well controlled.  Lites and renal function are normal. -     Comprehensive metabolic panel; Future  Right thyroid nodule- thyroid gland is not painful.  Palpation today is normal.  Her TFTs show normal thyroid function.  I have asked her to undergo an ultrasound to see if there is any concern for malignant  transformation. -     Thyroid Panel With TSH; Future -     US SOFT TISSUE HEAD & NECK (NON-THYROID); Future  Pernicious anemia- her H&H are normal now. -     CBC with Differential/Platelet; Future  GERD without esophagitis- her symptoms are well controlled with the PPI. -     omeprazole (PRILOSEC) 20 MG capsule; Take 1 capsule (20 mg total) by mouth daily as needed (for acid reflux.).  Other orders -     Discontinue: Zoster Vaccine Adjuvanted [facility]) injection; Inject 0.5 mLs into the muscle once. -     Zoster Vaccine Adjuvanted [facility]) injection; Inject 0.5 mLs into the muscle once.   I have discontinued Ms. Llewellyn's  ondansetron. I have also changed her omeprazole. Additionally, I am having her maintain her fluticasone, famotidine, loperamide, olmesartan-hydrochlorothiazide, hydrocortisone, and ALPRAZolam.  Meds ordered this encounter  Medications  . DISCONTD: Zoster Vaccine Adjuvanted [facility]) injection    Sig: Inject 0.5 mLs into the muscle once.    Dispense:  0.5 mL    Refill:  1  . Zoster Vaccine Adjuvanted [facility]) injection    Sig: Inject 0.5 mLs into the muscle once.    Dispense:  0.5 mL    Refill:  1  . omeprazole (PRILOSEC) 20 MG capsule    Sig: Take 1 capsule (20 mg total) by mouth daily as needed (for acid reflux.).    Dispense:  90 capsule    Refill:  1     Follow-up: Return in about 4 months (around 01/06/2018).  Scarlette Calico, MD

## 2017-09-08 ENCOUNTER — Encounter: Payer: Self-pay | Admitting: Internal Medicine

## 2017-09-08 DIAGNOSIS — K219 Gastro-esophageal reflux disease without esophagitis: Secondary | ICD-10-CM | POA: Insufficient documentation

## 2017-09-08 MED ORDER — OMEPRAZOLE 20 MG PO CPDR: 20.0000 mg | DELAYED_RELEASE_CAPSULE | Freq: Every day | ORAL | 1 refills | Status: DC | PRN

## 2017-09-14 ENCOUNTER — Other Ambulatory Visit: Payer: Self-pay | Admitting: Internal Medicine

## 2017-09-14 ENCOUNTER — Encounter: Payer: Self-pay | Admitting: General Surgery

## 2017-09-14 DIAGNOSIS — I10 Essential (primary) hypertension: Secondary | ICD-10-CM

## 2017-09-14 DIAGNOSIS — E041 Nontoxic single thyroid nodule: Secondary | ICD-10-CM

## 2017-09-16 ENCOUNTER — Other Ambulatory Visit: Payer: Self-pay | Admitting: Internal Medicine

## 2017-09-16 DIAGNOSIS — Z139 Encounter for screening, unspecified: Secondary | ICD-10-CM

## 2017-09-22 ENCOUNTER — Other Ambulatory Visit: Payer: Managed Care, Other (non HMO)

## 2017-09-23 ENCOUNTER — Ambulatory Visit
Admission: RE | Admit: 2017-09-23 | Discharge: 2017-09-23 | Disposition: A | Discharge status: Home / Self Care | Payer: Managed Care, Other (non HMO) | Source: Ambulatory Visit | Attending: Internal Medicine | Admitting: Internal Medicine

## 2017-09-23 ENCOUNTER — Encounter: Payer: Self-pay | Admitting: Internal Medicine

## 2017-09-23 ENCOUNTER — Other Ambulatory Visit: Payer: Self-pay | Admitting: Internal Medicine

## 2017-09-23 DIAGNOSIS — F419 Anxiety disorder, unspecified: Secondary | ICD-10-CM

## 2017-09-23 DIAGNOSIS — E041 Nontoxic single thyroid nodule: Secondary | ICD-10-CM

## 2017-10-03 ENCOUNTER — Encounter: Payer: Self-pay | Admitting: Internal Medicine

## 2017-10-03 ENCOUNTER — Other Ambulatory Visit: Payer: Self-pay | Admitting: Internal Medicine

## 2017-10-03 DIAGNOSIS — E041 Nontoxic single thyroid nodule: Secondary | ICD-10-CM

## 2017-10-04 NOTE — Telephone Encounter (Signed)
Pt would like a call back with her results from her [place] for her thyroid.

## 2017-10-04 NOTE — Telephone Encounter (Signed)
LVM for pt to call back as soon as possible.  RE: [place] of thyroid - results sent via my chart.

## 2017-10-08 ENCOUNTER — Other Ambulatory Visit: Payer: Self-pay | Admitting: Internal Medicine

## 2017-10-08 MED ORDER — HYDROCORTISONE 2.5 % EX CREA: TOPICAL_CREAM | Freq: Two times a day (BID) | CUTANEOUS | 3 refills | Status: DC

## 2017-10-12 ENCOUNTER — Encounter: Payer: Self-pay | Admitting: *Deleted

## 2017-10-19 ENCOUNTER — Ambulatory Visit: Payer: Managed Care, Other (non HMO)

## 2017-10-20 ENCOUNTER — Ambulatory Visit
Admission: RE | Admit: 2017-10-20 | Discharge: 2017-10-20 | Disposition: A | Discharge status: Home / Self Care | Payer: Managed Care, Other (non HMO) | Source: Ambulatory Visit | Attending: Ophthalmology | Admitting: Ophthalmology

## 2017-10-20 ENCOUNTER — Ambulatory Visit: Payer: Managed Care, Other (non HMO) | Admitting: Anesthesiology

## 2017-10-20 ENCOUNTER — Encounter
Admission: RE | Disposition: A | Discharge status: Home / Self Care | Payer: Self-pay | Source: Ambulatory Visit | Attending: Ophthalmology

## 2017-10-20 ENCOUNTER — Encounter: Payer: Self-pay | Admitting: *Deleted

## 2017-10-20 ENCOUNTER — Other Ambulatory Visit: Payer: Self-pay

## 2017-10-20 DIAGNOSIS — K279 Peptic ulcer, site unspecified, unspecified as acute or chronic, without hemorrhage or perforation: Secondary | ICD-10-CM | POA: Insufficient documentation

## 2017-10-20 DIAGNOSIS — E1136 Type 2 diabetes mellitus with diabetic cataract: Secondary | ICD-10-CM | POA: Insufficient documentation

## 2017-10-20 DIAGNOSIS — I10 Essential (primary) hypertension: Secondary | ICD-10-CM | POA: Insufficient documentation

## 2017-10-20 DIAGNOSIS — Z91048 Other nonmedicinal substance allergy status: Secondary | ICD-10-CM | POA: Diagnosis not present

## 2017-10-20 DIAGNOSIS — F419 Anxiety disorder, unspecified: Secondary | ICD-10-CM | POA: Diagnosis not present

## 2017-10-20 DIAGNOSIS — Z91040 Latex allergy status: Secondary | ICD-10-CM | POA: Diagnosis not present

## 2017-10-20 DIAGNOSIS — D649 Anemia, unspecified: Secondary | ICD-10-CM | POA: Insufficient documentation

## 2017-10-20 DIAGNOSIS — R0602 Shortness of breath: Secondary | ICD-10-CM | POA: Diagnosis not present

## 2017-10-20 DIAGNOSIS — Z888 Allergy status to other drugs, medicaments and biological substances status: Secondary | ICD-10-CM | POA: Diagnosis not present

## 2017-10-20 DIAGNOSIS — H2511 Age-related nuclear cataract, right eye: Secondary | ICD-10-CM | POA: Diagnosis present

## 2017-10-20 DIAGNOSIS — E785 Hyperlipidemia, unspecified: Secondary | ICD-10-CM | POA: Insufficient documentation

## 2017-10-20 DIAGNOSIS — Z90710 Acquired absence of both cervix and uterus: Secondary | ICD-10-CM | POA: Diagnosis not present

## 2017-10-20 DIAGNOSIS — K219 Gastro-esophageal reflux disease without esophagitis: Secondary | ICD-10-CM | POA: Diagnosis not present

## 2017-10-20 DIAGNOSIS — Z86010 Personal history of colonic polyps: Secondary | ICD-10-CM | POA: Diagnosis not present

## 2017-10-20 HISTORY — DX: Anxiety disorder, unspecified: F41.9

## 2017-10-20 HISTORY — PX: CATARACT EXTRACTION W/PHACO: SHX586

## 2017-10-20 SURGERY — PHACOEMULSIFICATION, CATARACT, WITH IOL INSERTION
Anesthesia: Monitor Anesthesia Care | Site: Eye | Laterality: Right | Wound class: Clean

## 2017-10-20 MED ORDER — MOXIFLOXACIN HCL 0.5 % OP SOLN
OPHTHALMIC | Status: DC | PRN
  Administered 2017-10-20: 0.2 mL via OPHTHALMIC

## 2017-10-20 MED ORDER — SODIUM HYALURONATE 23 MG/ML IO SOLN
INTRAOCULAR | Status: DC | PRN
  Administered 2017-10-20: 0.6 mL via INTRAOCULAR

## 2017-10-20 MED ORDER — ARMC OPHTHALMIC DILATING DROPS
1.0000 "application " | OPHTHALMIC | Status: AC
  Administered 2017-10-20 (×3): 1 via OPHTHALMIC

## 2017-10-20 MED ORDER — MIDAZOLAM HCL 2 MG/2ML IJ SOLN
INTRAMUSCULAR | Status: DC | PRN
  Administered 2017-10-20 (×2): 0.5 mg via INTRAVENOUS

## 2017-10-20 MED ORDER — FENTANYL CITRATE (PF) 100 MCG/2ML IJ SOLN
INTRAMUSCULAR | Status: DC | PRN
  Administered 2017-10-20 (×2): 25 ug via INTRAVENOUS

## 2017-10-20 MED ORDER — BSS IO SOLN
INTRAOCULAR | Status: DC | PRN
  Administered 2017-10-20: 1 via INTRAOCULAR

## 2017-10-20 MED ORDER — SODIUM HYALURONATE 23 MG/ML IO SOLN
INTRAOCULAR | Status: AC
  Filled 2017-10-20: qty 0.6

## 2017-10-20 MED ORDER — MOXIFLOXACIN HCL 0.5 % OP SOLN: 1.0000 [drp] | OPHTHALMIC | Status: DC | PRN

## 2017-10-20 MED ORDER — MIDAZOLAM HCL 2 MG/2ML IJ SOLN
INTRAMUSCULAR | Status: AC
  Filled 2017-10-20: qty 2

## 2017-10-20 MED ORDER — LIDOCAINE HCL (PF) 4 % IJ SOLN
INTRAMUSCULAR | Status: AC
  Filled 2017-10-20: qty 5

## 2017-10-20 MED ORDER — ARMC OPHTHALMIC DILATING DROPS
OPHTHALMIC | Status: AC
  Filled 2017-10-20: qty 0.4

## 2017-10-20 MED ORDER — EPINEPHRINE PF 1 MG/ML IJ SOLN
INTRAMUSCULAR | Status: AC
  Filled 2017-10-20: qty 1

## 2017-10-20 MED ORDER — POVIDONE-IODINE 5 % OP SOLN
OPHTHALMIC | Status: AC
  Filled 2017-10-20: qty 30

## 2017-10-20 MED ORDER — LIDOCAINE HCL (PF) 4 % IJ SOLN
INTRAOCULAR | Status: DC | PRN
  Administered 2017-10-20: 4 mL via OPHTHALMIC

## 2017-10-20 MED ORDER — MOXIFLOXACIN HCL 0.5 % OP SOLN
OPHTHALMIC | Status: AC
  Filled 2017-10-20: qty 3

## 2017-10-20 MED ORDER — SODIUM CHLORIDE 0.9 % IV SOLN
INTRAVENOUS | Status: DC
  Administered 2017-10-20 (×2): via INTRAVENOUS

## 2017-10-20 MED ORDER — FENTANYL CITRATE (PF) 100 MCG/2ML IJ SOLN
INTRAMUSCULAR | Status: AC
  Filled 2017-10-20: qty 2

## 2017-10-20 MED ORDER — SODIUM HYALURONATE 10 MG/ML IO SOLN
INTRAOCULAR | Status: DC | PRN
  Administered 2017-10-20: 0.55 mL via INTRAOCULAR

## 2017-10-20 MED ORDER — POVIDONE-IODINE 5 % OP SOLN
OPHTHALMIC | Status: DC | PRN
  Administered 2017-10-20: 1 via OPHTHALMIC

## 2017-10-20 SURGICAL SUPPLY — 16 items
DISSECTOR HYDRO NUCLEUS 50X22 (MISCELLANEOUS) ×3 IMPLANT
GLOVE BIO SURGEON STRL SZ8 (GLOVE) ×3 IMPLANT
GLOVE BIOGEL M 6.5 STRL (GLOVE) ×3 IMPLANT
GLOVE SURG LX 7.5 STRW (GLOVE) ×2
GLOVE SURG LX STRL 7.5 STRW (GLOVE) ×1 IMPLANT
GOWN STRL REUS W/ TWL LRG LVL3 (GOWN DISPOSABLE) ×2 IMPLANT
GOWN STRL REUS W/TWL LRG LVL3 (GOWN DISPOSABLE) ×6
LABEL CATARACT MEDS ST (LABEL) ×3 IMPLANT
LENS IOL TECNIS ITEC 18.5 (Intraocular Lens) ×3 IMPLANT
PACK CATARACT (MISCELLANEOUS) ×3 IMPLANT
PACK CATARACT KING (MISCELLANEOUS) ×3 IMPLANT
PACK EYE AFTER SURG (MISCELLANEOUS) ×3 IMPLANT
SOL BSS BAG (MISCELLANEOUS) ×3
SOLUTION BSS BAG (MISCELLANEOUS) ×1 IMPLANT
WATER STERILE IRR 250ML POUR (IV SOLUTION) ×3 IMPLANT
WIPE NON LINTING 3.25X3.25 (MISCELLANEOUS) ×3 IMPLANT

## 2017-10-20 NOTE — Anesthesia Preprocedure Evaluation (Signed)
Anesthesia Evaluation  Patient identified by MRN, date of birth, ID band Patient awake    Reviewed: Allergy & Precautions, H&P , NPO status , Patient's Chart, lab work & pertinent test results  History of Anesthesia Complications Negative for: history of anesthetic complications  Airway Mallampati: II   Neck ROM: full    Dental   Pulmonary shortness of breath,    breath sounds clear to auscultation       Cardiovascular hypertension,  Rhythm:regular Rate:Normal     Neuro/Psych Anxiety    GI/Hepatic PUD, GERD  ,  Endo/Other  diabetes, Type 2  Renal/GU      Musculoskeletal   Abdominal   Peds  Hematology  (+) anemia ,   Anesthesia Other Findings Past Medical History: No date: Anemia No date: Anxiety No date: Blood transfusion No date: Colitis, ulcerative ([place]) No date: Colon polyps No date: Hyperlipidemia No date: Hypertension No date: Ileostomy present ([place]) No date: Numbness and tingling     Comment:  hands and feet bilat  No date: Shortness of breath dyspnea     Comment:  talking or walking  09/05/2015: Type 2 diabetes mellitus with complication, without long- term current use of insulin ([facility])     Comment:  currently on no medications, (05/26/2016) pt denies               diabetes.  States that she had one time high blood sugars              d/t prednisone  BMI    Body Mass Index:  31.76 kg/m    Past Surgical History: 2000: ABDOMINAL HYSTERECTOMY No date: ABDOMINAL SURGERY No date: COLON SURGERY     Comment:  colostomy No date: cortisol shot     Comment:  in left shoulder No date: DILATION AND CURETTAGE OF UTERUS     Comment:  most likely after miscarriage No date: fibrocystic breast disease     Comment:  q 6 month mammogram No date: gravida 6 para 2     Comment:  all SAB No date: hyadiform mole 09/25/2015: ILEO LOOP DIVERSION; N/A     Comment:  Procedure: ILEO LOOP COLOSTOMY;  Surgeon: Leighton Ruff,              MD;  Location: [facility];  Service: General;  Laterality:               N/A; 10/08/2015: ILEOSTOMY CLOSURE; N/A     Comment:  Procedure: ILEOSTOMY REVISION;  Surgeon: Leighton Ruff,               MD;  Location: [facility];  Service: General;  Laterality:               N/A; 08/20/2016: ILEOSTOMY CLOSURE; N/A     Comment:  Procedure: OPEN RELOCATION OF ILEOSTOMY;  Surgeon:               Leighton Ruff, MD;  Location: [facility];  Service: General;               Laterality: N/A; 09/01/2016: IR GENERIC HISTORICAL     Comment:  IR US GUIDE VASC ACCESS RIGHT 09/01/2016 Ascencion Dike,               PA-C [facility] 09/01/2016: IR GENERIC HISTORICAL     Comment:  IR FLUORO GUIDE CV LINE RIGHT 09/01/2016 Ascencion Dike,               PA-C  [facility] No date: KNEE ARTHROSCOPY W/ MENISCAL REPAIR     Comment:  right knee Latanya Maudlin) 09/25/2015: LAPAROSCOPIC SMALL BOWEL RESECTION; N/A     Comment:  Procedure: LAPAROSCOPIC BOWEL RESECTION TIMES TWO;                Surgeon: Leighton Ruff, MD;  Location: [facility];  Service:               General;  Laterality: N/A; 09/25/2015: ROBOTIC ASSISTED LAPAROSCOPIC LYSIS OF ADHESION; N/A     Comment:  Procedure: XI ROBOTIC ASSISTED LAPAROSCOPIC LYSIS OF               ADHESION;  Surgeon: Leighton Ruff, MD;  Location: [facility];              Service: General;  Laterality: N/A;  90 minutes No date: Small bowel obstruction   Reproductive/Obstetrics                             Anesthesia Physical  Anesthesia Plan  ASA: III  Anesthesia Plan: MAC   Post-op Pain Management:    Induction: Intravenous  PONV Risk Score and Plan:   Airway Management Planned: Natural Airway and Nasal Cannula  Additional Equipment:   Intra-op Plan:   Post-operative Plan:   Informed Consent: I have reviewed the patients History and Physical, chart, labs and discussed the procedure including the risks, benefits and alternatives for the  proposed anesthesia with the patient or authorized representative who has indicated his/her understanding and acceptance.   Dental Advisory Given  Plan Discussed with: CRNA, Anesthesiologist and Surgeon  Anesthesia Plan Comments: (Patient consented for risks of anesthesia including but not limited to:  - adverse reactions to medications - damage to teeth, lips or other oral mucosa - sore throat or hoarseness - Damage to heart, brain, lungs or loss of life  Patient voiced understanding.)        Anesthesia Quick Evaluation

## 2017-10-20 NOTE — Discharge Instructions (Signed)
°  FOLLOW DR. Melony Overly POSTOP EYE DROP INSTRUCTION SHEET AS REVIEWED.  Eye Surgery Discharge Instructions  Expect mild scratchy sensation or mild soreness. DO NOT RUB YOUR EYE!  The day of surgery:  Minimal physical activity, but bed rest is not required  No reading, computer work, or close hand work  No bending, lifting, or straining.  May watch TV  For 24 hours:  No driving, legal decisions, or alcoholic beverages  Safety precautions  Eat anything you prefer: It is better to start with liquids, then soup then solid foods.  _____ Eye patch should be worn until postoperative exam tomorrow.  ____ Solar shield eyeglasses should be worn for comfort in the sunlight/patch while sleeping  Resume all regular medications including aspirin or Coumadin if these were discontinued prior to surgery. You may shower, bathe, shave, or wash your hair. Tylenol may be taken for mild discomfort.  Call your doctor if you experience significant pain, nausea, or vomiting, fever > 101 or other signs of infection. [phone] or [phone] Specific instructions:  Follow-up Information    [name], MD Follow up.   Specialty:  Ophthalmology Why:  10/21/17 @ 1:40 PM    IN THE [place] OFFICE. Contact information: [address] [place] [address] [phone]

## 2017-10-20 NOTE — Op Note (Signed)
OPERATIVE NOTE  Latasha Wood [id number] 10/20/2017   PREOPERATIVE DIAGNOSIS:  Nuclear sclerotic cataract right eye.  H25.11   POSTOPERATIVE DIAGNOSIS:    Nuclear sclerotic cataract right eye.     PROCEDURE:  Phacoemusification with posterior chamber intraocular lens placement of the right eye   LENS:   Implant Name Type Inv. Item Serial No. Manufacturer Lot No. LRB No. Used  LENS IOL DIOP 18.5 - [id number] Intraocular Lens LENS IOL DIOP 18.5 [id number]  Right 1       PCB00 +18.5   ULTRASOUND TIME: 0 minutes 50 seconds.  CDE 6.61   SURGEON:  Benay Pillow, MD, MPH  ANESTHESIOLOGIST: Anesthesiologist: Piscitello, Precious Haws, MD CRNA: Courtney Paris, CRNA   ANESTHESIA:  Topical with tetracaine drops augmented with 1% preservative-free intracameral lidocaine.  ESTIMATED BLOOD LOSS: less than 1 mL.   COMPLICATIONS:  None.   DESCRIPTION OF PROCEDURE:  The patient was identified in the holding room and transported to the operating room and placed in the supine position under the operating microscope.  The right eye was identified as the operative eye and it was prepped and draped in the usual sterile ophthalmic fashion.   A 1.0 millimeter clear-corneal paracentesis was made at the 10:30 position. 0.5 ml of preservative-free 1% lidocaine with epinephrine was injected into the anterior chamber.  The anterior chamber was filled with Healon 5 viscoelastic.  A 2.4 millimeter keratome was used to make a near-clear corneal incision at the 8:00 position.  A curvilinear capsulorrhexis was made with a cystotome and capsulorrhexis forceps.  Balanced salt solution was used to hydrodissect and hydrodelineate the nucleus.   Phacoemulsification was then used in stop and chop fashion to remove the lens nucleus and epinucleus.  The remaining cortex was then removed using the irrigation and aspiration handpiece. Healon was then placed into the capsular bag to distend it for lens placement.  A  lens was then injected into the capsular bag.  The remaining viscoelastic was aspirated.   Wounds were hydrated with balanced salt solution.  The anterior chamber was inflated to a physiologic pressure with balanced salt solution.   Intracameral vigamox 0.1 mL undiluted was injected into the eye and a drop placed onto the ocular surface.  No wound leaks were noted.  The patient was taken to the recovery room in stable condition without complications of anesthesia or surgery  Benay Pillow 10/20/2017, 10:13 AM

## 2017-10-20 NOTE — Anesthesia Post-op Follow-up Note (Signed)
Anesthesia QCDR form completed.        

## 2017-10-20 NOTE — Anesthesia Postprocedure Evaluation (Signed)
Anesthesia Post Note  Patient: Latasha Wood  Procedure(s) Performed: CATARACT EXTRACTION PHACO AND INTRAOCULAR LENS PLACEMENT (IOC) (Right Eye)  Patient location during evaluation: PACU Anesthesia Type: MAC Level of consciousness: awake and alert Pain management: pain level controlled Vital Signs Assessment: post-procedure vital signs reviewed and stable Respiratory status: spontaneous breathing, nonlabored ventilation, respiratory function stable and patient connected to nasal cannula oxygen Cardiovascular status: stable and blood pressure returned to baseline Postop Assessment: no apparent nausea or vomiting Anesthetic complications: no     Last Vitals:  Vitals:   10/20/17 1017 10/20/17 1030  BP: 112/66 110/69  Pulse: 66 69  Resp: 16 16  Temp: 36.6 C   SpO2:      Last Pain:  Vitals:   10/20/17 1017  TempSrc: Oral                 Precious Haws Annaleia Pence

## 2017-10-20 NOTE — H&P (Signed)
The History and Physical notes are on paper, have been signed, and are to be scanned.   I have examined the patient and there are no changes to the H&P.   Benay Pillow 10/20/2017 9:40 AM

## 2017-10-20 NOTE — Transfer of Care (Signed)
Immediate Anesthesia Transfer of Care Note  Patient: Latasha Wood  Procedure(s) Performed: CATARACT EXTRACTION PHACO AND INTRAOCULAR LENS PLACEMENT (IOC) (Right Eye)  Patient Location: PACU and Short Stay  Anesthesia Type:MAC  Level of Consciousness: awake and patient cooperative  Airway & Oxygen Therapy: Patient Spontanous Breathing  Post-op Assessment: Report given to RN and Post -op Vital signs reviewed and stable  Post vital signs: Reviewed and stable  Last Vitals:  Vitals:   10/20/17 0807  BP: 102/79  Pulse: 78  Resp: 12  Temp: 37 C  SpO2: 98%    Last Pain:  Vitals:   10/20/17 0807  TempSrc: Oral         Complications: No apparent anesthesia complications

## 2017-10-22 ENCOUNTER — Ambulatory Visit
Admission: RE | Admit: 2017-10-22 | Discharge: 2017-10-22 | Disposition: A | Discharge status: Home / Self Care | Payer: Managed Care, Other (non HMO) | Source: Ambulatory Visit | Attending: Internal Medicine | Admitting: Internal Medicine

## 2017-10-22 DIAGNOSIS — Z139 Encounter for screening, unspecified: Secondary | ICD-10-CM

## 2017-10-22 LAB — HM MAMMOGRAPHY

## 2017-11-05 ENCOUNTER — Telehealth: Payer: Self-pay | Admitting: Internal Medicine

## 2017-11-05 NOTE — Telephone Encounter (Signed)
Copied from [place] [phone]. Topic: Inquiry >> Nov 05, 2017  1:11 PM Scherrie Gerlach wrote: Reason for CRM: Dr Opal Sidles , rheumatologist who is the 3rd party reviewer in the pt's disability case is requesting Dr Ronnald Ramp give him a call. Would liked to discuss pt's restrictions  [phone]

## 2017-11-08 NOTE — Telephone Encounter (Signed)
I don't have anything else to add

## 2018-02-09 ENCOUNTER — Ambulatory Visit: Payer: 59 | Admitting: Internal Medicine

## 2018-02-09 ENCOUNTER — Encounter: Payer: Self-pay | Admitting: Internal Medicine

## 2018-02-09 VITALS — BP 124/60 | HR 84 | Temp 98.5°F | Resp 16 | Ht 64.0 in | Wt 181.0 lb

## 2018-02-09 DIAGNOSIS — I10 Essential (primary) hypertension: Secondary | ICD-10-CM

## 2018-02-09 DIAGNOSIS — G4701 Insomnia due to medical condition: Secondary | ICD-10-CM | POA: Diagnosis not present

## 2018-02-09 DIAGNOSIS — L231 Allergic contact dermatitis due to adhesives: Secondary | ICD-10-CM | POA: Insufficient documentation

## 2018-02-09 DIAGNOSIS — G8929 Other chronic pain: Secondary | ICD-10-CM | POA: Insufficient documentation

## 2018-02-09 MED ORDER — SUVOREXANT 15 MG PO TABS
1.0000 | ORAL_TABLET | Freq: Every evening | ORAL | 5 refills | Status: DC | PRN
Start: 2018-02-09 — End: 2019-09-14

## 2018-02-09 MED ORDER — HYDROCORTISONE 2.5 % EX CREA: TOPICAL_CREAM | Freq: Two times a day (BID) | CUTANEOUS | 5 refills | Status: DC

## 2018-02-09 NOTE — Progress Notes (Signed)
Subjective:  Patient ID: Latasha Wood, female    DOB: Jun 12, 1956  Age: 62 y.o. MRN: [id number]  CC: Hypertension   HPI Latasha Wood presents for a BP check - she tells me that she saw a rheumatologist earlier today for evaluation of aches and pains.  She tells me that the discomfort keeps her awake at night. She tells me an extensive lab workup has been initiated and she is taking a course of methylprednisolone.  She has diffuse muscle and joint aches as well as neck pain.  She needs a refill on the steroid cream which she applies to the skin around the ostomy site on her abdomen.  Outpatient Medications Prior to Visit  Medication Sig Dispense Refill  . ALPRAZolam (XANAX) 0.25 MG tablet TAKE 1 TABLET TWICE A DAY AS NEEDED FOR ANXIETY 60 tablet 3  . famotidine (PEPCID) 20 MG tablet Take 1 tablet (20 mg total) by mouth 2 (two) times daily as needed for heartburn or indigestion.    . fluticasone (FLONASE) 50 MCG/ACT nasal spray 3-4 sprays once a week. Around peristomal skin--hold 5-6 inches away from skin  2  . loperamide (IMODIUM) 2 MG capsule Take 1 capsule (2 mg total) by mouth 3 (three) times daily as needed for diarrhea or loose stools. 60 capsule 3  . olmesartan-hydrochlorothiazide (BENICAR HCT) 40-12.5 MG tablet TAKE 1 TABLET BY MOUTH  DAILY 90 tablet 1  . omeprazole (PRILOSEC) 20 MG capsule Take 1 capsule (20 mg total) by mouth daily as needed (for acid reflux.). 90 capsule 1  . hydrocortisone 2.5 % cream Apply topically 2 (two) times daily. 30 g 3   No facility-administered medications prior to visit.     ROS Review of Systems  Constitutional: Positive for fatigue and unexpected weight change (wt gain). Negative for appetite change, chills and diaphoresis.  HENT: Negative.  Negative for trouble swallowing.   Eyes: Negative.  Negative for visual disturbance.  Respiratory: Negative for cough, chest tightness, shortness of breath and wheezing.   Cardiovascular: Negative.   Negative for chest pain, palpitations and leg swelling.  Gastrointestinal: Negative for abdominal pain, constipation, diarrhea, nausea and vomiting.  Endocrine: Negative.   Genitourinary: Negative.  Negative for difficulty urinating.  Musculoskeletal: Positive for arthralgias, myalgias and neck pain. Negative for back pain.  Skin: Negative.   Allergic/Immunologic: Negative.   Neurological: Negative.  Negative for dizziness, facial asymmetry, weakness, light-headedness and numbness.  Hematological: Negative for adenopathy. Does not bruise/bleed easily.  Psychiatric/Behavioral: Positive for sleep disturbance. Negative for behavioral problems, decreased concentration and dysphoric mood. The patient is nervous/anxious.     Objective:  BP 124/60 (BP Location: Left Arm, Patient Position: Sitting, Cuff Size: Normal)   Pulse 84   Temp 98.5 F (36.9 C) (Oral)   Resp 16   Ht 5' 4"  (1.626 m)   Wt 181 lb (82.1 kg)   SpO2 96%   BMI 31.07 kg/m   BP Readings from Last 3 Encounters:  02/09/18 124/60  10/20/17 110/69  09/07/17 118/62    Wt Readings from Last 3 Encounters:  02/09/18 181 lb (82.1 kg)  10/20/17 185 lb (83.9 kg)  09/07/17 180 lb 12 oz (82 kg)    Physical Exam  Constitutional: She is oriented to person, place, and time. No distress.  HENT:  Mouth/Throat: Oropharynx is clear and moist. No oropharyngeal exudate.  Eyes: Conjunctivae are normal. Left eye exhibits no discharge. No scleral icterus.  Neck: Normal range of motion. Neck supple. No JVD  present. No thyromegaly present.  Cardiovascular: Normal rate, regular rhythm and normal heart sounds. Exam reveals no gallop and no friction rub.  No murmur heard. Pulmonary/Chest: Effort normal and breath sounds normal. No respiratory distress. She has no wheezes. She has no rales.  Abdominal: Soft. Bowel sounds are normal. She exhibits no distension and no mass. There is no tenderness. There is no guarding.    Musculoskeletal:  Normal range of motion. She exhibits no edema or tenderness.  Lymphadenopathy:    She has no cervical adenopathy.  Neurological: She is alert and oriented to person, place, and time.  Skin: Skin is warm and dry. Rash noted. She is not diaphoretic. No erythema. No pallor.  Vitals reviewed.   Lab Results  Component Value Date   WBC 7.2 09/07/2017   HGB 12.0 09/07/2017   HCT 36.2 09/07/2017   PLT 331.0 09/07/2017   GLUCOSE 97 09/07/2017   CHOL 246 (H) 04/29/2017   TRIG 277.0 (H) 04/29/2017   HDL 85.20 04/29/2017   LDLDIRECT 114.0 04/29/2017   LDLCALC 92 08/17/2016   ALT 10 09/07/2017   AST 13 09/07/2017   NA 139 09/07/2017   K 3.7 09/07/2017   CL 104 09/07/2017   CREATININE 0.93 09/07/2017   BUN 15 09/07/2017   CO2 28 09/07/2017   TSH 1.06 09/07/2017   INR 1.16 12/11/2015   HGBA1C 5.7 04/29/2017   MICROALBUR 1.1 03/23/2016    Mm Screening Breast Tomo Bilateral  Result Date: 10/22/2017 CLINICAL DATA:  Screening. EXAM: 2D DIGITAL SCREENING BILATERAL MAMMOGRAM WITH CAD AND ADJUNCT TOMO COMPARISON:  Previous exam(s). ACR Breast Density Category b: There are scattered areas of fibroglandular density. FINDINGS: There are no findings suspicious for malignancy. Images were processed with CAD. IMPRESSION: No mammographic evidence of malignancy. A result letter of this screening mammogram will be mailed directly to the patient. RECOMMENDATION: Screening mammogram in one year. (Code:SM-B-01Y) BI-RADS CATEGORY  1: Negative. Electronically Signed   By: Ammie Ferrier M.D.   On: 10/22/2017 13:57    Assessment & Plan:   Latasha Wood was seen today for hypertension.  Diagnoses and all orders for this visit:  Essential hypertension- Her blood pressure is adequately well controlled.  I did not order a BMP today because she just had this done earlier today at the rheumatologist's office.  She will have a copy forwarded to me.  Allergic contact dermatitis due to adhesives -      hydrocortisone 2.5 % cream; Apply topically 2 (two) times daily.  Insomnia secondary to chronic pain -     Suvorexant (BELSOMRA) 15 MG TABS; Take 1 tablet by mouth at bedtime as needed.   I am having Latasha Wood start on [employer]. I am also having her maintain her fluticasone, famotidine, loperamide, omeprazole, olmesartan-hydrochlorothiazide, ALPRAZolam, and hydrocortisone.  Meds ordered this encounter  Medications  . hydrocortisone 2.5 % cream    Sig: Apply topically 2 (two) times daily.    Dispense:  30 g    Refill:  5  . Suvorexant (BELSOMRA) 15 MG TABS    Sig: Take 1 tablet by mouth at bedtime as needed.    Dispense:  30 tablet    Refill:  5     Follow-up: Return in about 6 months (around 08/11/2018).  Scarlette Calico, MD

## 2018-02-09 NOTE — Patient Instructions (Signed)

## 2018-02-17 ENCOUNTER — Telehealth: Payer: Self-pay

## 2018-02-17 NOTE — Telephone Encounter (Signed)
Key : [id number]  Submitted today via Cover My Meds. Currently pending approval.

## 2018-02-23 NOTE — Telephone Encounter (Signed)
PA has been approved. Approval faxed to pharmacy.

## 2018-03-16 DIAGNOSIS — M2550 Pain in unspecified joint: Secondary | ICD-10-CM | POA: Diagnosis not present

## 2018-03-16 DIAGNOSIS — M1990 Unspecified osteoarthritis, unspecified site: Secondary | ICD-10-CM | POA: Diagnosis not present

## 2018-03-16 DIAGNOSIS — R768 Other specified abnormal immunological findings in serum: Secondary | ICD-10-CM | POA: Diagnosis not present

## 2018-03-16 DIAGNOSIS — K5190 Ulcerative colitis, unspecified, without complications: Secondary | ICD-10-CM | POA: Diagnosis not present

## 2018-03-16 DIAGNOSIS — R5383 Other fatigue: Secondary | ICD-10-CM | POA: Diagnosis not present

## 2018-03-24 ENCOUNTER — Encounter: Payer: Self-pay | Admitting: Gastroenterology

## 2018-05-04 ENCOUNTER — Other Ambulatory Visit: Payer: Self-pay | Admitting: Internal Medicine

## 2018-05-04 DIAGNOSIS — I10 Essential (primary) hypertension: Secondary | ICD-10-CM

## 2018-05-04 DIAGNOSIS — K219 Gastro-esophageal reflux disease without esophagitis: Secondary | ICD-10-CM

## 2018-05-11 ENCOUNTER — Ambulatory Visit (INDEPENDENT_AMBULATORY_CARE_PROVIDER_SITE_OTHER): Payer: Medicare Other | Admitting: Internal Medicine

## 2018-05-11 ENCOUNTER — Other Ambulatory Visit: Payer: Self-pay | Admitting: Internal Medicine

## 2018-05-11 ENCOUNTER — Encounter: Payer: Self-pay | Admitting: Internal Medicine

## 2018-05-11 VITALS — BP 90/58 | HR 94 | Temp 97.6°F | Ht 64.0 in | Wt 170.0 lb

## 2018-05-11 DIAGNOSIS — I10 Essential (primary) hypertension: Secondary | ICD-10-CM

## 2018-05-11 DIAGNOSIS — R05 Cough: Secondary | ICD-10-CM | POA: Diagnosis not present

## 2018-05-11 DIAGNOSIS — F419 Anxiety disorder, unspecified: Secondary | ICD-10-CM

## 2018-05-11 DIAGNOSIS — R059 Cough, unspecified: Secondary | ICD-10-CM

## 2018-05-11 MED ORDER — BENZONATATE 200 MG PO CAPS: 200.0000 mg | ORAL_CAPSULE | Freq: Three times a day (TID) | ORAL | 0 refills | Status: DC | PRN

## 2018-05-11 MED ORDER — DOXYCYCLINE HYCLATE 100 MG PO TABS: 100.0000 mg | ORAL_TABLET | Freq: Two times a day (BID) | ORAL | 0 refills | Status: DC

## 2018-05-11 NOTE — Patient Instructions (Addendum)
We have sent in doxycycline to take 1 pill twice a day for 1 week.  We have sent in tessalon perles to use up to 3 times per day for cough as needed.   Keep taking the claritin and the flonase for the next 1-2 weeks.

## 2018-05-11 NOTE — Progress Notes (Signed)
   Subjective:    Patient ID: Latasha Wood, female    DOB: 01-30-1956, 62 y.o.   MRN: [id number]  HPI The patient is a 62 YO female coming in for about 2 weeks of cough and sinus problems. She is losing her voice and is hoarse. She has cough and some mild SOB. She is tired all the time. Denies fevers or chills. Denies muscle aches. She does have nose drainage and some post nasal drip. Has used nasal spray and claritin but not consistently. She is taking otc medications. She is not sure of which but she has several. She has been taking benadryl as well through this. She overall feels like she is worsening. Cough is mostly non-productive.   Review of Systems  Constitutional: Positive for activity change and appetite change. Negative for chills, fatigue, fever and unexpected weight change.  HENT: Positive for congestion, postnasal drip, rhinorrhea and sinus pressure. Negative for ear discharge, ear pain, sinus pain, sneezing, sore throat, tinnitus, trouble swallowing and voice change.   Eyes: Negative.   Respiratory: Positive for cough and shortness of breath. Negative for chest tightness and wheezing.   Cardiovascular: Negative.   Gastrointestinal: Negative.   Musculoskeletal: Negative for myalgias.  Neurological: Negative.       Objective:   Physical Exam  Constitutional: She is oriented to person, place, and time. She appears well-developed and well-nourished.  HENT:  Head: Normocephalic and atraumatic.  Oropharynx with redness and clear drainage, nose with swollen turbinates, TMs normal bilaterally  Eyes: EOM are normal.  Neck: Normal range of motion. No thyromegaly present.  Cardiovascular: Normal rate and regular rhythm.  Pulmonary/Chest: Effort normal and breath sounds normal. No respiratory distress. She has no wheezes. She has no rales.  Mild rhonchi in the bases which partially clears with coughing  Abdominal: Soft.  Musculoskeletal: She exhibits no tenderness.    Lymphadenopathy:    She has no cervical adenopathy.  Neurological: She is alert and oriented to person, place, and time.  Skin: Skin is warm and dry.   Vitals:   05/11/18 1012  BP: (!) 90/58  Pulse: 94  Temp: 97.6 F (36.4 C)  TempSrc: Oral  SpO2: 99%  Weight: 170 lb (77.1 kg)  Height: 5' 4"  (1.626 m)      Assessment & Plan:

## 2018-05-13 ENCOUNTER — Encounter: Payer: Self-pay | Admitting: Internal Medicine

## 2018-05-13 DIAGNOSIS — R059 Cough, unspecified: Secondary | ICD-10-CM | POA: Insufficient documentation

## 2018-05-13 DIAGNOSIS — R052 Subacute cough: Secondary | ICD-10-CM | POA: Insufficient documentation

## 2018-05-13 DIAGNOSIS — R05 Cough: Secondary | ICD-10-CM | POA: Insufficient documentation

## 2018-05-13 NOTE — Assessment & Plan Note (Addendum)
Rx for doxycycline and tessalon perles for cough. Counseled to take flonase daily and claritin daily for net 1-2 weeks for best results.

## 2018-05-13 NOTE — Assessment & Plan Note (Signed)
Advised to hold BP med until she is eating and drinking normally. BP is low today and she is having some mild dizziness.

## 2018-08-01 ENCOUNTER — Encounter (HOSPITAL_BASED_OUTPATIENT_CLINIC_OR_DEPARTMENT_OTHER): Payer: Self-pay | Admitting: *Deleted

## 2018-08-01 ENCOUNTER — Ambulatory Visit: Payer: Self-pay

## 2018-08-01 ENCOUNTER — Ambulatory Visit: Payer: Medicare Other | Admitting: Family

## 2018-08-01 ENCOUNTER — Ambulatory Visit: Payer: Medicare Other | Admitting: Internal Medicine

## 2018-08-01 ENCOUNTER — Other Ambulatory Visit: Payer: Self-pay

## 2018-08-01 ENCOUNTER — Emergency Department (HOSPITAL_BASED_OUTPATIENT_CLINIC_OR_DEPARTMENT_OTHER): Payer: Medicare Other

## 2018-08-01 ENCOUNTER — Emergency Department (HOSPITAL_BASED_OUTPATIENT_CLINIC_OR_DEPARTMENT_OTHER)
Admission: EM | Admit: 2018-08-01 | Discharge: 2018-08-01 | Disposition: A | Discharge status: Home / Self Care | Payer: Medicare Other | Attending: Emergency Medicine | Admitting: Emergency Medicine

## 2018-08-01 DIAGNOSIS — R110 Nausea: Secondary | ICD-10-CM | POA: Diagnosis not present

## 2018-08-01 DIAGNOSIS — E119 Type 2 diabetes mellitus without complications: Secondary | ICD-10-CM | POA: Diagnosis not present

## 2018-08-01 DIAGNOSIS — R1031 Right lower quadrant pain: Secondary | ICD-10-CM | POA: Diagnosis not present

## 2018-08-01 DIAGNOSIS — Z91040 Latex allergy status: Secondary | ICD-10-CM | POA: Diagnosis not present

## 2018-08-01 DIAGNOSIS — I10 Essential (primary) hypertension: Secondary | ICD-10-CM | POA: Diagnosis not present

## 2018-08-01 DIAGNOSIS — F419 Anxiety disorder, unspecified: Secondary | ICD-10-CM | POA: Diagnosis not present

## 2018-08-01 DIAGNOSIS — Z79899 Other long term (current) drug therapy: Secondary | ICD-10-CM | POA: Insufficient documentation

## 2018-08-01 LAB — CBC
HCT: 37.9 % (ref 36.0–46.0)
HEMOGLOBIN: 12.7 g/dL (ref 12.0–15.0)
MCH: 28.4 pg (ref 26.0–34.0)
MCHC: 33.5 g/dL (ref 30.0–36.0)
MCV: 84.8 fL (ref 78.0–100.0)
Platelets: 305 10*3/uL (ref 150–400)
RBC: 4.47 MIL/uL (ref 3.87–5.11)
RDW: 13.4 % (ref 11.5–15.5)
WBC: 7.3 10*3/uL (ref 4.0–10.5)

## 2018-08-01 LAB — URINALYSIS, ROUTINE W REFLEX MICROSCOPIC
BILIRUBIN URINE: NEGATIVE
Glucose, UA: NEGATIVE mg/dL
Hgb urine dipstick: NEGATIVE
Ketones, ur: NEGATIVE mg/dL
LEUKOCYTES UA: NEGATIVE
NITRITE: NEGATIVE
PH: 5.5 (ref 5.0–8.0)
Protein, ur: NEGATIVE mg/dL
Specific Gravity, Urine: >1.03 — ABNORMAL HIGH (ref 1.005–1.030)

## 2018-08-01 LAB — COMPREHENSIVE METABOLIC PANEL
ALBUMIN: 4.2 g/dL (ref 3.5–5.0)
ALT: 18 U/L (ref 0–44)
AST: 21 U/L (ref 15–41)
Alkaline Phosphatase: 99 U/L (ref 38–126)
Anion gap: 11 (ref 5–15)
BILIRUBIN TOTAL: 0.9 mg/dL (ref 0.3–1.2)
BUN: 21 mg/dL (ref 8–23)
CO2: 27 mmol/L (ref 22–32)
Calcium: 9.4 mg/dL (ref 8.9–10.3)
Chloride: 100 mmol/L (ref 98–111)
Creatinine, Ser: 0.86 mg/dL (ref 0.44–1.00)
GFR calc Af Amer: >60 mL/min (ref >60)
GFR calc non Af Amer: >60 mL/min (ref >60)
GLUCOSE: 73 mg/dL (ref 70–99)
POTASSIUM: 3.3 mmol/L — AB (ref 3.5–5.1)
SODIUM: 138 mmol/L (ref 135–145)
TOTAL PROTEIN: 8.2 g/dL — AB (ref 6.5–8.1)

## 2018-08-01 LAB — LIPASE, BLOOD: Lipase: 24 U/L (ref 11–51)

## 2018-08-01 MED ORDER — ONDANSETRON HCL 4 MG/2ML IJ SOLN
4.0000 mg | Freq: Once | INTRAMUSCULAR | Status: AC
  Administered 2018-08-01: 4 mg via INTRAVENOUS
  Filled 2018-08-01: qty 2

## 2018-08-01 MED ORDER — MORPHINE SULFATE (PF) 4 MG/ML IV SOLN
4.0000 mg | Freq: Once | INTRAVENOUS | Status: AC
  Administered 2018-08-01: 4 mg via INTRAVENOUS
  Filled 2018-08-01: qty 1

## 2018-08-01 MED ORDER — IOPAMIDOL (ISOVUE-300) INJECTION 61%
100.0000 mL | Freq: Once | INTRAVENOUS | Status: AC | PRN
  Administered 2018-08-01: 100 mL via INTRAVENOUS

## 2018-08-01 MED ORDER — SODIUM CHLORIDE 0.9 % IV BOLUS
1000.0000 mL | Freq: Once | INTRAVENOUS | Status: AC
  Administered 2018-08-01: 1000 mL via INTRAVENOUS

## 2018-08-01 NOTE — ED Provider Notes (Signed)
[facility] EMERGENCY DEPARTMENT Provider Note   CSN: [id number] Arrival date & time: 08/01/18  1409     History   Chief Complaint Chief Complaint  Patient presents with  . Abdominal Pain    HPI Latasha Wood is a 62 y.o. female.  His prior history of ulcerative colitis and has had a colectomy and ileostomy.  She has had episodes of bowel obstruction before.  She states she has been having increased abdominal pain since last Wednesday.  Initially it felt like a blockage when she was having distention and no output but that resolved and she began have output again.  Since Thursday she has had sharp stabbing right lower quadrant abdominal pain that is not resolved.  She continues to have output nonbloody from her ileostomy.  Some nausea no vomiting.  No fevers no chills no urinary symptoms no chest pain no shortness of breath.  She called her PCP today and they recommended she come here as they knew she would need a CAT scan.  The history is provided by the patient.  Abdominal Pain   This is a new problem. Episode onset: 6 days. The problem occurs constantly. The problem has not changed since onset.The pain is associated with an unknown factor. The pain is located in the RLQ. The quality of the pain is sharp and cramping. The pain is moderate. Associated symptoms include nausea. Pertinent negatives include fever, melena, vomiting, dysuria, frequency, hematuria and headaches. Nothing aggravates the symptoms. Nothing relieves the symptoms. Past workup includes surgery.    Past Medical History:  Diagnosis Date  . Anemia   . Anxiety   . Blood transfusion   . Colitis, ulcerative ([place])   . Colon polyps   . Hyperlipidemia   . Hypertension   . Ileostomy present ([place])   . Numbness and tingling    hands and feet bilat   . Shortness of breath dyspnea    talking or walking   . Type 2 diabetes mellitus with complication, without long-term current use of insulin ([place]) 09/05/2015    currently on no medications, (05/26/2016) pt denies diabetes.  States that she had one time high blood sugars d/t prednisone    Patient Active Problem List   Diagnosis Date Noted  . Cough 05/13/2018  . Allergic contact dermatitis due to adhesives 02/09/2018  . Insomnia secondary to chronic pain 02/09/2018  . GERD without esophagitis 09/08/2017  . Ileostomy dysfunction ([place]) 08/20/2016  . Right thyroid nodule 08/17/2016  . Arthropathy in ulcerative colitis ([place]) 03/23/2016  . Visit for screening mammogram 03/23/2016  . Ulcerative colitis ([place]) 09/25/2015  . Prediabetes 09/05/2015  . Pure hyperglyceridemia 09/01/2015  . Anxiety 09/01/2015  . Routine general medical examination at a health care facility 05/28/2015  . Hyperlipidemia LDL goal <70 04/17/2011  . Essential hypertension 12/30/2007    Past Surgical History:  Procedure Laterality Date  . ABDOMINAL HYSTERECTOMY  2000  . ABDOMINAL SURGERY    . BREAST CYST ASPIRATION    . CATARACT EXTRACTION W/PHACO Right 10/20/2017   Procedure: CATARACT EXTRACTION PHACO AND INTRAOCULAR LENS PLACEMENT (IOC);  Surgeon: [name], MD;  Location: [facility] ORS;  Service: Ophthalmology;  Laterality: Right;  [place] 00:50.1 AP% 13.2 CDE 6.61 Fluid Pack lot # [id number]  . COLON SURGERY     colostomy  . cortisol shot     in left shoulder  . DILATION AND CURETTAGE OF UTERUS     most likely after miscarriage  . fibrocystic breast disease  q 6 month mammogram  . gravida 6 para 2     all SAB  . hyadiform mole    . ILEO LOOP DIVERSION N/A 09/25/2015   Procedure: ILEO LOOP COLOSTOMY;  Surgeon: Leighton Ruff, MD;  Location: [facility] OR[facility];  Service: General;  Laterality: N/A;  . ILEOSTOMY CLOSURE N/A 10/08/2015   Procedure: ILEOSTOMY REVISION;  Surgeon: Leighton Ruff, MD;  Location: [facility] OR[facility];  Service: General;  Laterality: N/A;  . ILEOSTOMY CLOSURE N/A 08/20/2016   Procedure: OPEN RELOCATION OF ILEOSTOMY;  Surgeon: Leighton Ruff, MD;  Location: [facility] OR[facility];   Service: General;  Laterality: N/A;  . IR GENERIC HISTORICAL  09/01/2016   IR US GUIDE VASC ACCESS RIGHT 09/01/2016 Ascencion Dike, PA-C [facility]  . IR GENERIC HISTORICAL  09/01/2016   IR FLUORO GUIDE CV LINE RIGHT 09/01/2016 Ascencion Dike, PA-C [facility]  . KNEE ARTHROSCOPY W/ MENISCAL REPAIR     right knee ([place])  . LAPAROSCOPIC SMALL BOWEL RESECTION N/A 09/25/2015   Procedure: LAPAROSCOPIC BOWEL RESECTION TIMES TWO;  Surgeon: Leighton Ruff, MD;  Location: [facility];  Service: General;  Laterality: N/A;  . ROBOTIC ASSISTED LAPAROSCOPIC LYSIS OF ADHESION N/A 09/25/2015   Procedure: XI ROBOTIC ASSISTED LAPAROSCOPIC LYSIS OF ADHESION;  Surgeon: Leighton Ruff, MD;  Location: [facility];  Service: General;  Laterality: N/A;  90 minutes  . Small bowel obstruction       OB History   None      Home Medications    Prior to Admission medications   Medication Sig Start Date End Date Taking? Authorizing Provider  ALPRAZolam Duanne Moron) 0.25 MG tablet TAKE 1 TABLET TWICE A DAY AS NEEDED FOR ANXIETY 05/11/18   Janith Lima, MD  benzonatate (TESSALON) 200 MG capsule Take 1 capsule (200 mg total) by mouth 3 (three) times daily as needed. 05/11/18   Hoyt Koch, MD  doxycycline (VIBRA-TABS) 100 MG tablet Take 1 tablet (100 mg total) by mouth 2 (two) times daily. 05/11/18   Hoyt Koch, MD  famotidine (PEPCID) 20 MG tablet Take 1 tablet (20 mg total) by mouth 2 (two) times daily as needed for heartburn or indigestion. 46/96/29   Leighton Ruff, MD  fluticasone [facility]) 50 MCG/ACT nasal spray 3-4 sprays once a week. Around peristomal skin--hold 5-6 inches away from skin 07/31/16   [provider]  hydrocortisone 2.5 % cream Apply topically 2 (two) times daily. 02/09/18   Janith Lima, MD  loperamide (IMODIUM) 2 MG capsule Take 1 capsule (2 mg total) by mouth 3 (three) times daily as needed for diarrhea or loose stools. 52/84/13   Leighton Ruff, MD    olmesartan-hydrochlorothiazide (BENICAR HCT) 40-12.5 MG tablet Take 1 tablet by mouth daily. 05/04/18   Janith Lima, MD  omeprazole (PRILOSEC) 20 MG capsule Take 1 capsule (20 mg total) by mouth daily. 05/04/18   Janith Lima, MD  Suvorexant (BELSOMRA) 15 MG TABS Take 1 tablet by mouth at bedtime as needed. 02/09/18   Janith Lima, MD    Family History Family History  Problem Relation Age of Onset  . Diabetes Mother   . Breast cancer Mother 64  . Diabetes Father   . Heart disease Father   . Diabetes Sister   . Diabetes Brother   . Breast cancer Sister 74  . Colon cancer Paternal Aunt   . Breast cancer Maternal Aunt     Social History Social History   Tobacco Use  . Smoking status: Never Smoker  .  Smokeless tobacco: Never Used  Substance Use Topics  . Alcohol use: Yes    Alcohol/week: 2.0 standard drinks    Types: 2 Standard drinks or equivalent per week    Comment: occ  . Drug use: No     Allergies   Betadine [povidone iodine]; Latex; and Tape   Review of Systems Review of Systems  Constitutional: Negative for fever.  HENT: Negative for sore throat.   Eyes: Negative for visual disturbance.  Respiratory: Negative for shortness of breath.   Cardiovascular: Negative for chest pain.  Gastrointestinal: Positive for abdominal pain and nausea. Negative for melena and vomiting.  Genitourinary: Negative for dysuria, frequency and hematuria.  Musculoskeletal: Negative for neck pain.  Skin: Negative for rash.  Neurological: Negative for headaches.     Physical Exam Updated Vital Signs BP 130/80 (BP Location: Right Arm)   Pulse (!) 59   Temp 98.7 F (37.1 C) (Oral)   Resp 18   Ht 5' 4"  (1.626 m)   Wt 73 kg   SpO2 100%   BMI 27.64 kg/m   Physical Exam  Constitutional: She is oriented to person, place, and time. She appears well-developed and well-nourished. No distress.  HENT:  Head: Normocephalic and atraumatic.  Eyes: Conjunctivae are normal.  Neck:  Neck supple.  Cardiovascular: Normal rate and regular rhythm.  No murmur heard. Pulmonary/Chest: Effort normal and breath sounds normal. No respiratory distress.  Abdominal: Soft. Bowel sounds are normal. There is tenderness in the right lower quadrant. There is no rigidity and no guarding.  Musculoskeletal: She exhibits no edema or deformity.  Neurological: She is alert and oriented to person, place, and time.  Skin: Skin is warm and dry. Capillary refill takes less than 2 seconds.  Psychiatric: She has a normal mood and affect.  Nursing note and vitals reviewed.    ED Treatments / Results  Labs (all labs ordered are listed, but only abnormal results are displayed) Labs Reviewed  COMPREHENSIVE METABOLIC PANEL - Abnormal; Notable for the following components:      Result Value   Potassium 3.3 (*)    Total Protein 8.2 (*)    All other components within normal limits  URINALYSIS, ROUTINE W REFLEX MICROSCOPIC - Abnormal; Notable for the following components:   Specific Gravity, Urine >1.030 (*)    All other components within normal limits  LIPASE, BLOOD  CBC    EKG None  Radiology Ct Abdomen Pelvis W Contrast  Result Date: 08/01/2018 CLINICAL DATA:  62 year old with right lower quadrant pain and pressure for 5 days. Patient has an ileostomy. History of ulcerative colitis and total colectomy. EXAM: CT ABDOMEN AND PELVIS WITH CONTRAST TECHNIQUE: Multidetector CT imaging of the abdomen and pelvis was performed using the standard protocol following bolus administration of intravenous contrast. CONTRAST:  157m ISOVUE-300 IOPAMIDOL (ISOVUE-300) INJECTION 61% COMPARISON:  08/28/2016 FINDINGS: Lower chest: Subtle nodularity along the right hemidiaphragm on sequence 4, image 10 is unchanged since 08/28/2016 and likely benign. Atelectasis or scarring at the medial left lung base. Stable punctate nodule along the left hemidiaphragm is compatible with a benign finding. Hepatobiliary: Stable  low-density structure in the left hepatic lobe measures 0.5 cm and likely an incidental finding. Normal appearance of the liver and gallbladder. Main portal venous system is patent. No biliary dilatation. Pancreas: Previously there was a low-density structure along the pancreatic tail which has resolved or markedly decreased in size. Structure previously measured up to 1.1 cm and there may be a 0.7 cm low-density  structure in this area. Stable pancreatic duct dilatation. No evidence for acute inflammation. Spleen: Normal in size without focal abnormality. Adrenals/Urinary Tract: Normal adrenal glands. Stable cortical cyst in the right kidney interpolar region. Normal appearance the left kidney. Negative for hydronephrosis. Urinary bladder is unremarkable. Stomach/Bowel: Status post a total proctocolectomy with end ileostomy in the right abdominal wall. Again noted is a segment of dilated small bowel related to the anastomosis in the pelvis. There is an air-fluid level in this dilated loop of bowel in the mid pelvis but no surrounding inflammatory changes. There is oral contrast in the ostomy and no evidence for a bowel obstruction. Vascular/Lymphatic: Atherosclerotic calcifications in the aorta and iliac arteries without aneurysm. Mildly prominent mesenteric lymph nodes are likely reactive from prior surgery. Reproductive: Status post hysterectomy. No adnexal masses. Other: Small amount of free fluid in the pelvis. Laxity along the lower anterior abdominal wall. Musculoskeletal: Degenerative facet disease in lower lumbar spine. IMPRESSION: 1. Trace fluid in the pelvis is nonspecific. Occult inflammatory process cannot be excluded but focal inflammation is not identified. 2. Postoperative changes compatible with a total proctocolectomy and end ileostomy. No evidence for bowel obstruction. 3. Low-density collection or fluid along the pancreatic tail has markedly decreased in size or nearly resolved. Electronically  Signed   By: Markus Daft M.D.   On: 08/01/2018 19:44    Procedures Procedures (including critical care time)  Medications Ordered in ED Medications  sodium chloride 0.9 % bolus 1,000 mL (0 mLs Intravenous Stopped 08/01/18 1826)  ondansetron (ZOFRAN) injection 4 mg (4 mg Intravenous Given 08/01/18 1755)  morphine 4 MG/ML injection 4 mg (4 mg Intravenous Given 08/01/18 1755)  iopamidol (ISOVUE-300) 61 % injection 100 mL (100 mLs Intravenous Contrast Given 08/01/18 1844)     Initial Impression / Assessment and Plan / ED Course  I have reviewed the triage vital signs and the nursing notes.  Pertinent labs & imaging results that were available during my care of the patient were reviewed by me and considered in my medical decision making (see chart for details).  Clinical Course as of Aug 02 1126  Mon Aug 01, 2018  1959 Reviewed the patient's testing with her including her CAT scan.  I recommended that she go home for now and go with a bland diet and contact her surgeon tomorrow for review her testing and see if she can get reevaluated by her. and nonproductive  She is comfortable with plan.   [MB]    Clinical Course User Index [MB] Hayden Rasmussen, MD      Final Clinical Impressions(s) / ED Diagnoses   Final diagnoses:  Right lower quadrant abdominal pain    ED Discharge Orders    None       Hayden Rasmussen, MD 08/02/18 [phone]

## 2018-08-01 NOTE — Discharge Instructions (Addendum)
You were evaluated in the emergency department for right-sided abdominal pain.  You had CAT scan, blood work, urinalysis that did not show an obvious explanation for your pain.  You should use Tylenol and ibuprofen for pain and a bland diet.  Please contact your general surgeon for follow-up and return if any worsening symptoms.

## 2018-08-01 NOTE — ED Notes (Signed)
Pt. Up to restroom to urinate.  Per Pt. The contrast made her feel the need to "urinate"

## 2018-08-01 NOTE — Telephone Encounter (Signed)
rec'd phone call from pt. With report of right lower quadrant abdominal pain, radiating to suprapubic area.  Stated she had onset of abdominal bloating with intermittent cramping on Wed., 07/27/18.  Reported for several hours on above date, her Ileostomy output had decreased significantly.  Reported "the discomfort felt like I had a blockage."  C/o nausea, but has not vomited.  Stated after several hours the Ileostomy output started to become  "normal."  Reported the stoma looks irritated.  Since, last Wed., has had "moderate" discomfort in the right lower abdomen with tenderness upon palpation. Stated "it has worsened over past 5 days, and feels like I have been punched in the stomach.  Unaware of fever or chills. Stated has continued nausea.  Reported hx of hysterectomy.  Stated she has had a "milky white vaginal discharge over past 2 mos."   [place], and would not be able to get to appt., before Wednesday.  Advised she should be evaluated today, and should go to an Urgent care or ER, [place].  Stated she preferred to be seen at her PCP office, since her MD knows her history.  Stated she could be back in [place] in 4 hrs.  Appt. given with Nurse Practitioner at 2:20 PM., today.  Advised to be NPO after 10:30 AM, in case of need for CT scan.  Advised to seek emergency care if her symptoms worsen, as she travels back home.  Verb. Understanding.     Reason for Disposition . [1] MILD-MODERATE pain AND [2] constant AND [3] present > 2 hours  Answer Assessment - Initial Assessment Questions 1. LOCATION: "Where does it hurt?"      Right lower abdominal pain 2. RADIATION: "Does the pain shoot anywhere else?" (e.g., chest, back)     Radiating from the right lower quad of abdomen to suprapubic area 3. ONSET: "When did the pain begin?" (e.g., minutes, hours or days ago)      Wednesday 9/18 4. SUDDEN: "Gradual or sudden onset?"     Sudden onset with abdominal cramping,  bloating and decreased output in Ilieostomy pouch over several hours 5. PATTERN "Does the pain come and go, or is it constant?"    - If constant: "Is it getting better, staying the same, or worsening?"      (Note: Constant means the pain never goes away completely; most serious pain is constant and it progresses)     - If intermittent: "How long does it last?" "Do you have pain now?"     (Note: Intermittent means the pain goes away completely between bouts)     Constant discomfort in right lower quadrant 6. SEVERITY: "How bad is the pain?"  (e.g., Scale 1-10; mild, moderate, or severe)   - MILD (1-3): doesn't interfere with normal activities, abdomen soft and not tender to touch    - MODERATE (4-7): interferes with normal activities or awakens from sleep, tender to touch    - SEVERE (8-10): excruciating pain, doubled over, unable to do any normal activities      Moderate; has worsened over past 5 days; feels like an ache or like you've been punched in stomach  7. RECURRENT SYMPTOM: "Have you ever had this type of abdominal pain before?" If so, ask: "When was the last time?" and "What happened that time?"     No  8. CAUSE: "What do you think is causing the abdominal pain?"     unknown 9.  RELIEVING/AGGRAVATING FACTORS: "What makes it better or worse?" (e.g., movement, antacids, bowel movement)     Drinking tea; avoiding eating heavy meals 10. OTHER SYMPTOMS: "Has there been any vomiting, diarrhea, constipation, or urine problems?"       Is not bloated at this time; does have normal output through Ileostomy; stoma looks irritated; reported a milky white vaginal discharge x 2 mos.  11. PREGNANCY: "Is there any chance you are pregnant?" "When was your last menstrual period?"       No; hysterectomy  Protocols used: ABDOMINAL PAIN - [facility]

## 2018-08-01 NOTE — ED Triage Notes (Signed)
Right lower quadrant pain and pressure x 5 days. She has an ileostomy. She had abdominal distention before the pain started.

## 2019-03-10 DIAGNOSIS — T8131XD Disruption of external operation (surgical) wound, not elsewhere classified, subsequent encounter: Secondary | ICD-10-CM | POA: Diagnosis not present

## 2019-03-10 DIAGNOSIS — Z932 Ileostomy status: Secondary | ICD-10-CM | POA: Diagnosis not present

## 2019-03-10 DIAGNOSIS — K5190 Ulcerative colitis, unspecified, without complications: Secondary | ICD-10-CM | POA: Diagnosis not present

## 2019-03-13 DIAGNOSIS — K5190 Ulcerative colitis, unspecified, without complications: Secondary | ICD-10-CM | POA: Diagnosis not present

## 2019-03-13 DIAGNOSIS — T8131XD Disruption of external operation (surgical) wound, not elsewhere classified, subsequent encounter: Secondary | ICD-10-CM | POA: Diagnosis not present

## 2019-03-13 DIAGNOSIS — Z932 Ileostomy status: Secondary | ICD-10-CM | POA: Diagnosis not present

## 2019-03-22 DIAGNOSIS — T8131XD Disruption of external operation (surgical) wound, not elsewhere classified, subsequent encounter: Secondary | ICD-10-CM | POA: Diagnosis not present

## 2019-03-22 DIAGNOSIS — Z932 Ileostomy status: Secondary | ICD-10-CM | POA: Diagnosis not present

## 2019-03-22 DIAGNOSIS — K5190 Ulcerative colitis, unspecified, without complications: Secondary | ICD-10-CM | POA: Diagnosis not present

## 2019-04-01 ENCOUNTER — Other Ambulatory Visit: Payer: Self-pay | Admitting: Internal Medicine

## 2019-04-01 DIAGNOSIS — L231 Allergic contact dermatitis due to adhesives: Secondary | ICD-10-CM

## 2019-04-01 DIAGNOSIS — F419 Anxiety disorder, unspecified: Secondary | ICD-10-CM

## 2019-06-14 ENCOUNTER — Other Ambulatory Visit: Payer: Self-pay | Admitting: Internal Medicine

## 2019-06-14 DIAGNOSIS — I10 Essential (primary) hypertension: Secondary | ICD-10-CM

## 2019-08-03 DIAGNOSIS — T8131XD Disruption of external operation (surgical) wound, not elsewhere classified, subsequent encounter: Secondary | ICD-10-CM | POA: Diagnosis not present

## 2019-08-03 DIAGNOSIS — K5190 Ulcerative colitis, unspecified, without complications: Secondary | ICD-10-CM | POA: Diagnosis not present

## 2019-08-03 DIAGNOSIS — Z932 Ileostomy status: Secondary | ICD-10-CM | POA: Diagnosis not present

## 2019-08-16 ENCOUNTER — Telehealth: Payer: Self-pay | Admitting: Internal Medicine

## 2019-08-16 NOTE — Telephone Encounter (Signed)
Pt has an appt for cpe on 09-14-2019 and pt is out of town and needs benicar 40-12.5 mg sent to [facility] [address] [place] [phone] phone number [phone]. Pt would like 30 day supply. Please call patient once rx has been sent

## 2019-08-16 NOTE — Telephone Encounter (Signed)
Pt is requesting rx refill for [facility]icar [facility] [place]. Pt has an appt for 09/14/2019 and is requesting a 30 day supply. Pt is out of town with family.   I did contact spouse and informed that pt should have ran out of medication in December of 2019. Spouse stated that she would take 1/2 of a tablet at times.

## 2019-08-22 NOTE — Telephone Encounter (Signed)
Pt calling back to follow up on refill request for benicar.  Pt states she has only been taking 1/2 due to her ileostomy, and having low bp at times.  Pt is out of town and prefers you call her at  [phone].

## 2019-09-14 ENCOUNTER — Encounter: Payer: Self-pay | Admitting: Internal Medicine

## 2019-09-14 ENCOUNTER — Other Ambulatory Visit (INDEPENDENT_AMBULATORY_CARE_PROVIDER_SITE_OTHER): Payer: Medicare Other

## 2019-09-14 ENCOUNTER — Ambulatory Visit (INDEPENDENT_AMBULATORY_CARE_PROVIDER_SITE_OTHER): Payer: Medicare Other | Admitting: Internal Medicine

## 2019-09-14 ENCOUNTER — Other Ambulatory Visit: Payer: Self-pay

## 2019-09-14 VITALS — BP 138/86 | HR 72 | Temp 98.2°F | Resp 16 | Ht 64.0 in | Wt 189.0 lb

## 2019-09-14 DIAGNOSIS — E785 Hyperlipidemia, unspecified: Secondary | ICD-10-CM | POA: Diagnosis not present

## 2019-09-14 DIAGNOSIS — Z0000 Encounter for general adult medical examination without abnormal findings: Secondary | ICD-10-CM | POA: Diagnosis not present

## 2019-09-14 DIAGNOSIS — Z23 Encounter for immunization: Secondary | ICD-10-CM

## 2019-09-14 DIAGNOSIS — K51019 Ulcerative (chronic) pancolitis with unspecified complications: Secondary | ICD-10-CM

## 2019-09-14 DIAGNOSIS — E781 Pure hyperglyceridemia: Secondary | ICD-10-CM

## 2019-09-14 DIAGNOSIS — Z114 Encounter for screening for human immunodeficiency virus [HIV]: Secondary | ICD-10-CM

## 2019-09-14 DIAGNOSIS — R202 Paresthesia of skin: Secondary | ICD-10-CM

## 2019-09-14 DIAGNOSIS — R7303 Prediabetes: Secondary | ICD-10-CM

## 2019-09-14 DIAGNOSIS — Z1231 Encounter for screening mammogram for malignant neoplasm of breast: Secondary | ICD-10-CM

## 2019-09-14 DIAGNOSIS — E041 Nontoxic single thyroid nodule: Secondary | ICD-10-CM

## 2019-09-14 DIAGNOSIS — I10 Essential (primary) hypertension: Secondary | ICD-10-CM

## 2019-09-14 LAB — VITAMIN B12: Vitamin B-12: 288 pg/mL (ref 211–911)

## 2019-09-14 LAB — CBC WITH DIFFERENTIAL/PLATELET
Basophils Absolute: 0 10*3/uL (ref 0.0–0.1)
Basophils Relative: 0.3 % (ref 0.0–3.0)
Eosinophils Absolute: 0.2 10*3/uL (ref 0.0–0.7)
Eosinophils Relative: 2.5 % (ref 0.0–5.0)
HCT: 36.8 % (ref 36.0–46.0)
Hemoglobin: 12.1 g/dL (ref 12.0–15.0)
Lymphocytes Relative: 23.4 % (ref 12.0–46.0)
Lymphs Abs: 2.1 10*3/uL (ref 0.7–4.0)
MCHC: 32.9 g/dL (ref 30.0–36.0)
MCV: 86.8 fl (ref 78.0–100.0)
Monocytes Absolute: 0.5 10*3/uL (ref 0.1–1.0)
Monocytes Relative: 5.3 % (ref 3.0–12.0)
Neutro Abs: 6.3 10*3/uL (ref 1.4–7.7)
Neutrophils Relative %: 68.5 % (ref 43.0–77.0)
Platelets: 312 10*3/uL (ref 150.0–400.0)
RBC: 4.24 Mil/uL (ref 3.87–5.11)
RDW: 13.7 % (ref 11.5–15.5)
WBC: 9.1 10*3/uL (ref 4.0–10.5)

## 2019-09-14 LAB — BASIC METABOLIC PANEL
BUN: 17 mg/dL (ref 6–23)
CO2: 26 mEq/L (ref 19–32)
Calcium: 9.3 mg/dL (ref 8.4–10.5)
Chloride: 103 mEq/L (ref 96–112)
Creatinine, Ser: 1.02 mg/dL (ref 0.40–1.20)
GFR: 65.91 mL/min (ref >60.00)
Glucose, Bld: 102 mg/dL — ABNORMAL HIGH (ref 70–99)
Potassium: 4.3 mEq/L (ref 3.5–5.1)
Sodium: 137 mEq/L (ref 135–145)

## 2019-09-14 LAB — URINALYSIS, ROUTINE W REFLEX MICROSCOPIC
Bilirubin Urine: NEGATIVE
Hgb urine dipstick: NEGATIVE
Ketones, ur: NEGATIVE
Leukocytes,Ua: NEGATIVE
Nitrite: NEGATIVE
RBC / HPF: NONE SEEN (ref 0–?)
Specific Gravity, Urine: 1.02 (ref 1.000–1.030)
Total Protein, Urine: NEGATIVE
Urine Glucose: NEGATIVE
Urobilinogen, UA: 0.2 (ref 0.0–1.0)
pH: 5.5 (ref 5.0–8.0)

## 2019-09-14 LAB — HEPATIC FUNCTION PANEL
ALT: 14 U/L (ref 0–35)
AST: 15 U/L (ref 0–37)
Albumin: 4 g/dL (ref 3.5–5.2)
Alkaline Phosphatase: 90 U/L (ref 39–117)
Bilirubin, Direct: 0.1 mg/dL (ref 0.0–0.3)
Total Bilirubin: 0.4 mg/dL (ref 0.2–1.2)
Total Protein: 7.5 g/dL (ref 6.0–8.3)

## 2019-09-14 LAB — POCT GLYCOSYLATED HEMOGLOBIN (HGB A1C): Hemoglobin A1C: 5.6 % (ref 4.0–5.6)

## 2019-09-14 LAB — LIPID PANEL
Cholesterol: 207 mg/dL — ABNORMAL HIGH (ref 0–200)
HDL: 86.6 mg/dL (ref >39.00)
LDL Cholesterol: 100 mg/dL — ABNORMAL HIGH (ref 0–99)
NonHDL: 120.26
Total CHOL/HDL Ratio: 2
Triglycerides: 99 mg/dL (ref 0.0–149.0)
VLDL: 19.8 mg/dL (ref 0.0–40.0)

## 2019-09-14 LAB — FOLATE: Folate: 16.4 ng/mL (ref >5.9)

## 2019-09-14 LAB — TSH: TSH: 1.12 u[IU]/mL (ref 0.35–4.50)

## 2019-09-14 MED ORDER — OLMESARTAN MEDOXOMIL-HCTZ 40-12.5 MG PO TABS: 1.0000 | ORAL_TABLET | Freq: Every day | ORAL | 1 refills | Status: DC

## 2019-09-14 MED ORDER — ZOSTER VAC RECOMB ADJUVANTED 50 MCG/0.5ML IM SUSR: 0.5000 mL | Freq: Once | INTRAMUSCULAR | 1 refills | Status: AC

## 2019-09-14 MED ORDER — ATORVASTATIN CALCIUM 10 MG PO TABS: 10.0000 mg | ORAL_TABLET | Freq: Every day | ORAL | 1 refills | Status: DC

## 2019-09-14 NOTE — Progress Notes (Signed)
Subjective:  Patient ID: [name], female    DOB: 11-Sep-1956  Age: 63 y.o. MRN: [id number]  CC: Annual Exam, Hypertension, and Hyperlipidemia   HPI Amaurie C Rendleman presents for a CPX.  She tells me her blood pressure is well controlled.  She tells me that several months ago she developed diarrhea from her ostomy and got dehydrated and was admitted for IV fluids in [place].  She continues to have intermittent diarrhea and gets symptom relief with Imodium. She does not see blood in her stool.  She tells me her blood pressure has been normal since then and she denies headache, blurred vision, chest pain, shortness of breath, edema, diaphoresis, or fatigue.  Outpatient Medications Prior to Visit  Medication Sig Dispense Refill  . ALPRAZolam (XANAX) 0.25 MG tablet TAKE 1 TABLET BY MOUTH TWICE A DAY AS NEEDED FOR ANXIETY 60 tablet 1  . fluticasone (FLONASE) 50 MCG/ACT nasal spray 3-4 sprays once a week. Around peristomal skin--hold 5-6 inches away from skin  2  . hydrocortisone 2.5 % cream APPLY TO AFFECTED AREA TWICE A DAY 28.35 g 1  . loperamide (IMODIUM) 2 MG capsule Take 1 capsule (2 mg total) by mouth 3 (three) times daily as needed for diarrhea or loose stools. 60 capsule 3  . omeprazole (PRILOSEC) 20 MG capsule Take 1 capsule (20 mg total) by mouth daily. 90 capsule 1  . famotidine (PEPCID) 20 MG tablet Take 1 tablet (20 mg total) by mouth 2 (two) times daily as needed for heartburn or indigestion.    Marland Kitchen olmesartan-hydrochlorothiazide (BENICAR HCT) 40-12.5 MG tablet Take 1 tablet by mouth daily. 90 tablet 1  . Suvorexant (BELSOMRA) 15 MG TABS Take 1 tablet by mouth at bedtime as needed. 30 tablet 5  . benzonatate (TESSALON) 200 MG capsule Take 1 capsule (200 mg total) by mouth 3 (three) times daily as needed. (Patient not taking: Reported on 09/14/2019) 60 capsule 0  . doxycycline (VIBRA-TABS) 100 MG tablet Take 1 tablet (100 mg total) by mouth 2 (two) times daily. 14 tablet  0   No facility-administered medications prior to visit.     ROS Review of Systems  Constitutional: Positive for unexpected weight change (wt gain). Negative for appetite change, diaphoresis and fatigue.  HENT: Negative.   Eyes: Negative for visual disturbance.  Respiratory: Negative for cough, chest tightness, shortness of breath and wheezing.   Gastrointestinal: Positive for diarrhea. Negative for anal bleeding, blood in stool, nausea and vomiting.  Endocrine: Negative.   Genitourinary: Negative.  Negative for difficulty urinating.  Musculoskeletal: Negative.  Negative for arthralgias and myalgias.  Skin: Negative.   Neurological: Negative.  Negative for dizziness, weakness and light-headedness.       She complains of a 5-year history of tingling in her toes.  Hematological: Negative for adenopathy. Does not bruise/bleed easily.  Psychiatric/Behavioral: Negative.     Objective:  BP 138/86 (BP Location: Left Arm, Patient Position: Sitting, Cuff Size: Large)   Pulse 72   Temp 98.2 F (36.8 C) (Oral)   Resp 16   Ht 5' 4"  (1.626 m)   Wt 189 lb (85.7 kg)   SpO2 98%   BMI 32.44 kg/m   BP Readings from Last 3 Encounters:  09/14/19 138/86  08/01/18 128/72  05/11/18 (!) 90/58    Wt Readings from Last 3 Encounters:  09/14/19 189 lb (85.7 kg)  08/01/18 161 lb (73 kg)  05/11/18 170 lb (77.1 kg)    Physical Exam Vitals signs reviewed.  Constitutional:      General: She is not in acute distress.    Appearance: Normal appearance. She is not ill-appearing, toxic-appearing or diaphoretic.  HENT:     Nose: Nose normal.     Mouth/Throat:     Mouth: Mucous membranes are moist.  Eyes:     General: No scleral icterus.    Conjunctiva/sclera: Conjunctivae normal.  Neck:     Musculoskeletal: Normal range of motion and neck supple.     Thyroid: Thyroid mass present. No thyromegaly or thyroid tenderness.     Trachea: Trachea normal.   Cardiovascular:     Rate and Rhythm: Normal  rate and regular rhythm.     Heart sounds: No murmur.  Pulmonary:     Effort: Pulmonary effort is normal.     Breath sounds: No stridor. No wheezing, rhonchi or rales.  Abdominal:     General: Abdomen is flat. Bowel sounds are normal. There is no distension.     Palpations: Abdomen is soft. There is no hepatomegaly or splenomegaly.     Tenderness: There is no abdominal tenderness. There is no guarding.  Musculoskeletal: Normal range of motion.     Right lower leg: No edema.     Left lower leg: No edema.  Lymphadenopathy:     Cervical: No cervical adenopathy.     Right cervical: No superficial, deep or posterior cervical adenopathy.    Left cervical: No superficial, deep or posterior cervical adenopathy.  Skin:    General: Skin is warm and dry.     Coloration: Skin is not pale.  Neurological:     General: No focal deficit present.     Mental Status: She is alert.     Lab Results  Component Value Date   WBC 9.1 09/14/2019   HGB 12.1 09/14/2019   HCT 36.8 09/14/2019   PLT 312.0 09/14/2019   GLUCOSE 102 (H) 09/14/2019   CHOL 207 (H) 09/14/2019   TRIG 99.0 09/14/2019   HDL 86.60 09/14/2019   LDLDIRECT 114.0 04/29/2017   LDLCALC 100 (H) 09/14/2019   ALT 14 09/14/2019   AST 15 09/14/2019   NA 137 09/14/2019   K 4.3 09/14/2019   CL 103 09/14/2019   CREATININE 1.02 09/14/2019   BUN 17 09/14/2019   CO2 26 09/14/2019   TSH 1.12 09/14/2019   INR 1.16 12/11/2015   HGBA1C 5.6 09/14/2019   MICROALBUR 1.1 03/23/2016    Ct Abdomen Pelvis W Contrast  Result Date: 08/01/2018 CLINICAL DATA:  63 year old with right lower quadrant pain and pressure for 5 days. Patient has an ileostomy. History of ulcerative colitis and total colectomy. EXAM: CT ABDOMEN AND PELVIS WITH CONTRAST TECHNIQUE: Multidetector CT imaging of the abdomen and pelvis was performed using the standard protocol following bolus administration of intravenous contrast. CONTRAST:  118m ISOVUE-300 IOPAMIDOL (ISOVUE-300)  INJECTION 61% COMPARISON:  08/28/2016 FINDINGS: Lower chest: Subtle nodularity along the right hemidiaphragm on sequence 4, image 10 is unchanged since 08/28/2016 and likely benign. Atelectasis or scarring at the medial left lung base. Stable punctate nodule along the left hemidiaphragm is compatible with a benign finding. Hepatobiliary: Stable low-density structure in the left hepatic lobe measures 0.5 cm and likely an incidental finding. Normal appearance of the liver and gallbladder. Main portal venous system is patent. No biliary dilatation. Pancreas: Previously there was a low-density structure along the pancreatic tail which has resolved or markedly decreased in size. Structure previously measured up to 1.1 cm and there may be a 0.7  cm low-density structure in this area. Stable pancreatic duct dilatation. No evidence for acute inflammation. Spleen: Normal in size without focal abnormality. Adrenals/Urinary Tract: Normal adrenal glands. Stable cortical cyst in the right kidney interpolar region. Normal appearance the left kidney. Negative for hydronephrosis. Urinary bladder is unremarkable. Stomach/Bowel: Status post a total proctocolectomy with end ileostomy in the right abdominal wall. Again noted is a segment of dilated small bowel related to the anastomosis in the pelvis. There is an air-fluid level in this dilated loop of bowel in the mid pelvis but no surrounding inflammatory changes. There is oral contrast in the ostomy and no evidence for a bowel obstruction. Vascular/Lymphatic: Atherosclerotic calcifications in the aorta and iliac arteries without aneurysm. Mildly prominent mesenteric lymph nodes are likely reactive from prior surgery. Reproductive: Status post hysterectomy. No adnexal masses. Other: Small amount of free fluid in the pelvis. Laxity along the lower anterior abdominal wall. Musculoskeletal: Degenerative facet disease in lower lumbar spine. IMPRESSION: 1. Trace fluid in the pelvis is  nonspecific. Occult inflammatory process cannot be excluded but focal inflammation is not identified. 2. Postoperative changes compatible with a total proctocolectomy and end ileostomy. No evidence for bowel obstruction. 3. Low-density collection or fluid along the pancreatic tail has markedly decreased in size or nearly resolved. Electronically Signed   By: Markus Daft M.D.   On: 08/01/2018 19:44    Assessment & Plan:   Tariana was seen today for annual exam, hypertension and hyperlipidemia.  Diagnoses and all orders for this visit:  Essential hypertension- Her blood pressure is adequately well controlled.  Electrolytes and renal function are normal.  Will continue the combination of an ARB and thiazide diuretic. -     CBC with Differential; Future -     Basic metabolic panel; Future -     Urinalysis, Routine w reflex microscopic; Future -     olmesartan-hydrochlorothiazide (BENICAR HCT) 40-12.5 MG tablet; Take 1 tablet by mouth daily.  Hyperlipidemia LDL goal <70- She has an elevated ASCVD risk score so I have asked her to take a statin for CV risk reduction. -     Lipid panel; Future -     TSH; Future -     Hepatic function panel; Future -     atorvastatin (LIPITOR) 10 MG tablet; Take 1 tablet (10 mg total) by mouth daily.  Pure hyperglyceridemia- Her triglycerides are normal now. -     Lipid panel; Future  Prediabetes- Improvement noted. -     Basic metabolic panel; Future -     Cancel: Hemoglobin A1c; Future -     POCT HgB A1C  Visit for screening mammogram -     MM Digital Screening; Future  Routine general medical examination at a health care facility- Exam completed, labs reviewed, vaccines reviewed and updated, she is referred for screening mammogram, cervical and colon cancer screening are up-to-date, patient education was given.  Encounter for screening for HIV -     HIV antibody (Reflex); Future  Need for influenza vaccination -     Flu Vaccine QUAD 36+ mos IM   Need for shingles vaccine -     Zoster Vaccine Adjuvanted [facility]) injection; Inject 0.5 mLs into the muscle once for 1 dose.  Right thyroid nodule- I have asked her to undergo a thyroid ultrasound to see if there is concern for malignant transformation. -     Cancel: US Soft Tissue Head/Neck; Future -     US Soft Tissue Head/Neck; Future  Tingling of  both feet- I do not see any secondary causes for this.  This appears to be a benign concern. -     B12; Future -     Folate; Future  Ulcerative pancolitis with complication [facility]) -     Ambulatory referral to Gastroenterology   I have discontinued Alisse C. Volkman's famotidine, Suvorexant, doxycycline, and benzonatate. I am also having her start on Zoster Vaccine Adjuvanted and atorvastatin. Additionally, I am having her maintain her fluticasone, loperamide, omeprazole, ALPRAZolam, hydrocortisone, and olmesartan-hydrochlorothiazide.  Meds ordered this encounter  Medications  . Zoster Vaccine Adjuvanted [facility]) injection    Sig: Inject 0.5 mLs into the muscle once for 1 dose.    Dispense:  0.5 mL    Refill:  1  . olmesartan-hydrochlorothiazide (BENICAR HCT) 40-12.5 MG tablet    Sig: Take 1 tablet by mouth daily.    Dispense:  90 tablet    Refill:  1  . atorvastatin (LIPITOR) 10 MG tablet    Sig: Take 1 tablet (10 mg total) by mouth daily.    Dispense:  90 tablet    Refill:  1     Follow-up: Return in about 6 months (around 03/13/2020).  Scarlette Calico, MD

## 2019-09-14 NOTE — Patient Instructions (Signed)
Health Maintenance, Female Adopting a healthy lifestyle and getting preventive care are important in promoting health and wellness. Ask your health care provider about:  The right schedule for you to have regular tests and exams.  Things you can do on your own to prevent diseases and keep yourself healthy. What should I know about diet, weight, and exercise? Eat a healthy diet   Eat a diet that includes plenty of vegetables, fruits, low-fat dairy products, and lean protein.  Do not eat a lot of foods that are high in solid fats, added sugars, or sodium. Maintain a healthy weight Body mass index (BMI) is used to identify weight problems. It estimates body fat based on height and weight. Your health care provider can help determine your BMI and help you achieve or maintain a healthy weight. Get regular exercise Get regular exercise. This is one of the most important things you can do for your health. Most adults should:  Exercise for at least 150 minutes each week. The exercise should increase your heart rate and make you sweat (moderate-intensity exercise).  Do strengthening exercises at least twice a week. This is in addition to the moderate-intensity exercise.  Spend less time sitting. Even light physical activity can be beneficial. Watch cholesterol and blood lipids Have your blood tested for lipids and cholesterol at 64 years of age, then have this test every 5 years. Have your cholesterol levels checked more often if:  Your lipid or cholesterol levels are high.  You are older than 64 years of age.  You are at high risk for heart disease. What should I know about cancer screening? Depending on your health history and family history, you may need to have cancer screening at various ages. This may include screening for:  Breast cancer.  Cervical cancer.  Colorectal cancer.  Skin cancer.  Lung cancer. What should I know about heart disease, diabetes, and high blood  pressure? Blood pressure and heart disease  High blood pressure causes heart disease and increases the risk of stroke. This is more likely to develop in people who have high blood pressure readings, are of African descent, or are overweight.  Have your blood pressure checked: ? Every 3-5 years if you are 18-39 years of age. ? Every year if you are 40 years old or older. Diabetes Have regular diabetes screenings. This checks your fasting blood sugar level. Have the screening done:  Once every three years after age 40 if you are at a normal weight and have a low risk for diabetes.  More often and at a younger age if you are overweight or have a high risk for diabetes. What should I know about preventing infection? Hepatitis B If you have a higher risk for hepatitis B, you should be screened for this virus. Talk with your health care provider to find out if you are at risk for hepatitis B infection. Hepatitis C Testing is recommended for:  Everyone born from 1945 through 1965.  Anyone with known risk factors for hepatitis C. Sexually transmitted infections (STIs)  Get screened for STIs, including gonorrhea and chlamydia, if: ? You are sexually active and are younger than 64 years of age. ? You are older than 64 years of age and your health care provider tells you that you are at risk for this type of infection. ? Your sexual activity has changed since you were last screened, and you are at increased risk for chlamydia or gonorrhea. Ask your health care provider if   you are at risk.  Ask your health care provider about whether you are at high risk for HIV. Your health care provider may recommend a prescription medicine to help prevent HIV infection. If you choose to take medicine to prevent HIV, you should first get tested for HIV. You should then be tested every 3 months for as long as you are taking the medicine. Pregnancy  If you are about to stop having your period (premenopausal) and  you may become pregnant, seek counseling before you get pregnant.  Take 400 to 800 micrograms (mcg) of folic acid every day if you become pregnant.  Ask for birth control (contraception) if you want to prevent pregnancy. Osteoporosis and menopause Osteoporosis is a disease in which the bones lose minerals and strength with aging. This can result in bone fractures. If you are 65 years old or older, or if you are at risk for osteoporosis and fractures, ask your health care provider if you should:  Be screened for bone loss.  Take a calcium or vitamin D supplement to lower your risk of fractures.  Be given hormone replacement therapy (HRT) to treat symptoms of menopause. Follow these instructions at home: Lifestyle  Do not use any products that contain nicotine or tobacco, such as cigarettes, e-cigarettes, and chewing tobacco. If you need help quitting, ask your health care provider.  Do not use street drugs.  Do not share needles.  Ask your health care provider for help if you need support or information about quitting drugs. Alcohol use  Do not drink alcohol if: ? Your health care provider tells you not to drink. ? You are pregnant, may be pregnant, or are planning to become pregnant.  If you drink alcohol: ? Limit how much you use to 0-1 drink a day. ? Limit intake if you are breastfeeding.  Be aware of how much alcohol is in your drink. In the [place] one drink equals one 12 oz bottle of beer (355 mL), one 5 oz glass of wine (148 mL), or one 1 oz glass of hard liquor (44 mL). General instructions  Schedule regular health, dental, and eye exams.  Stay current with your vaccines.  Tell your health care provider if: ? You often feel depressed. ? You have ever been abused or do not feel safe at home. Summary  Adopting a healthy lifestyle and getting preventive care are important in promoting health and wellness.  Follow your health care provider's instructions about healthy  diet, exercising, and getting tested or screened for diseases.  Follow your health care provider's instructions on monitoring your cholesterol and blood pressure. This information is not intended to replace advice given to you by your health care provider. Make sure you discuss any questions you have with your health care provider. Document Released: 05/11/2011 Document Revised: 10/19/2018 Document Reviewed: 10/19/2018 Elsevier Patient Education  2020 [employer]  

## 2019-09-15 LAB — HIV ANTIBODY (ROUTINE TESTING W REFLEX): HIV 1&2 Ab, 4th Generation: NONREACTIVE

## 2019-10-03 ENCOUNTER — Other Ambulatory Visit: Payer: Self-pay | Admitting: Internal Medicine

## 2019-10-03 ENCOUNTER — Ambulatory Visit
Admission: RE | Admit: 2019-10-03 | Discharge: 2019-10-03 | Disposition: A | Discharge status: Home / Self Care | Payer: Medicare Other | Source: Ambulatory Visit | Attending: Internal Medicine | Admitting: Internal Medicine

## 2019-10-03 ENCOUNTER — Encounter: Payer: Self-pay | Admitting: Internal Medicine

## 2019-10-03 DIAGNOSIS — E041 Nontoxic single thyroid nodule: Secondary | ICD-10-CM

## 2019-10-16 ENCOUNTER — Other Ambulatory Visit: Payer: Self-pay | Admitting: Internal Medicine

## 2019-10-16 DIAGNOSIS — F419 Anxiety disorder, unspecified: Secondary | ICD-10-CM

## 2019-10-19 ENCOUNTER — Encounter (INDEPENDENT_AMBULATORY_CARE_PROVIDER_SITE_OTHER): Payer: Self-pay | Admitting: Otolaryngology

## 2019-10-19 ENCOUNTER — Other Ambulatory Visit: Payer: Self-pay

## 2019-10-19 ENCOUNTER — Ambulatory Visit (INDEPENDENT_AMBULATORY_CARE_PROVIDER_SITE_OTHER): Payer: Medicare Other | Admitting: Otolaryngology

## 2019-10-19 VITALS — Temp 97.3°F

## 2019-10-19 DIAGNOSIS — E041 Nontoxic single thyroid nodule: Secondary | ICD-10-CM | POA: Diagnosis not present

## 2019-10-19 NOTE — Progress Notes (Signed)
HPI: Latasha Wood is a 63 y.o. female who presents is referred by PCP for evaluation of enlarging right thyroid nodule.  Patient apparently has had thyroid nodules for several years and had previous needle biopsy performed 3 years ago by Dr. Constance Holster which was benign.  She has had enlargement of the right thyroid nodule and had repeat ultrasound performed 2 weeks ago.  This demonstrated significant increase in size in the right thyroid nodule which was moderately suspicious with TI-RADS category 4 and met criteria for biopsy. She denies any pain or tenderness associated with this.  She has had no hoarseness.. Apparently her thyroid function tests have been normal.  Past Medical History:  Diagnosis Date  . Anemia   . Anxiety   . Blood transfusion   . Colitis, ulcerative ([place])   . Colon polyps   . Hyperlipidemia   . Hypertension   . Ileostomy present ([place])   . Numbness and tingling    hands and feet bilat   . Shortness of breath dyspnea    talking or walking   . Type 2 diabetes mellitus with complication, without long-term current use of insulin ([place]) 09/05/2015   currently on no medications, (05/26/2016) pt denies diabetes.  States that she had one time high blood sugars d/t prednisone   Past Surgical History:  Procedure Laterality Date  . ABDOMINAL HYSTERECTOMY  2000  . ABDOMINAL SURGERY    . BREAST CYST ASPIRATION    . CATARACT EXTRACTION W/PHACO Right 10/20/2017   Procedure: CATARACT EXTRACTION PHACO AND INTRAOCULAR LENS PLACEMENT (IOC);  Surgeon: [name], MD;  Location: [facility] ORS;  Service: Ophthalmology;  Laterality: Right;  [place] 00:50.1 AP% 13.2 CDE 6.61 Fluid Pack lot # [id number]  . COLON SURGERY     colostomy  . cortisol shot     in left shoulder  . DILATION AND CURETTAGE OF UTERUS     most likely after miscarriage  . fibrocystic breast disease     q 6 month mammogram  . gravida 6 para 2     all SAB  . hyadiform mole    . ILEO LOOP DIVERSION N/A 09/25/2015    Procedure: ILEO LOOP COLOSTOMY;  Surgeon: Leighton Ruff, MD;  Location: [facility];  Service: General;  Laterality: N/A;  . ILEOSTOMY CLOSURE N/A 10/08/2015   Procedure: ILEOSTOMY REVISION;  Surgeon: Leighton Ruff, MD;  Location: [facility];  Service: General;  Laterality: N/A;  . ILEOSTOMY CLOSURE N/A 08/20/2016   Procedure: OPEN RELOCATION OF ILEOSTOMY;  Surgeon: Leighton Ruff, MD;  Location: [facility];  Service: General;  Laterality: N/A;  . IR GENERIC HISTORICAL  09/01/2016   IR US GUIDE VASC ACCESS RIGHT 09/01/2016 Ascencion Dike, PA-C [facility]  . IR GENERIC HISTORICAL  09/01/2016   IR FLUORO GUIDE CV LINE RIGHT 09/01/2016 Ascencion Dike, PA-C [facility]  . KNEE ARTHROSCOPY W/ MENISCAL REPAIR     right knee ([place])  . LAPAROSCOPIC SMALL BOWEL RESECTION N/A 09/25/2015   Procedure: LAPAROSCOPIC BOWEL RESECTION TIMES TWO;  Surgeon: Leighton Ruff, MD;  Location: [facility];  Service: General;  Laterality: N/A;  . ROBOTIC ASSISTED LAPAROSCOPIC LYSIS OF ADHESION N/A 09/25/2015   Procedure: XI ROBOTIC ASSISTED LAPAROSCOPIC LYSIS OF ADHESION;  Surgeon: Leighton Ruff, MD;  Location: [facility];  Service: General;  Laterality: N/A;  90 minutes  . Small bowel obstruction     Social History   Socioeconomic History  . Marital status: Married    Spouse name: Not on file  .  Number of children: 2  . Years of education: 38  . Highest education level: Not on file  Occupational History  . 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 Smoking status: Never Smoker  . Smokeless tobacco: Never Used  Substance and Sexual Activity  . Alcohol use: Yes    Alcohol/week: 2.0 standard drinks    Types: 2 Standard drinks or equivalent per week    Comment: occ  . Drug use: No  . Sexual activity: Yes    Partners: Male  Other Topics Concern  . Not on file  Social History Narrative   HSG, [employer] - BA, [employer] for med tech.  Married '75. 2 dtrs - '79, '82; 1  grandchild. Marriage is in good health. No abuse issues.    Social Determinants of Health   Financial Resource Strain:   . Difficulty of Paying Living Expenses: Not on file  Food Insecurity:   . Worried About Charity fundraiser in the Last Year: Not on file  . Ran Out of Food in the Last Year: Not on file  Transportation Needs:   . Lack of Transportation (Medical): Not on file  . Lack of Transportation (Non-Medical): Not on file  Physical Activity:   . Days of Exercise per Week: Not on file  . Minutes of Exercise per Session: Not on file  Stress:   . Feeling of Stress : Not on file  Social Connections:   . Frequency of Communication with Friends and Family: Not on file  . Frequency of Social Gatherings with Friends and Family: Not on file  . Attends Religious Services: Not on file  . Active Member of Clubs or Organizations: Not on file  . Attends Archivist Meetings: Not on file  . Marital Status: Not on file   Family History  Problem Relation Age of Onset  . Diabetes Mother   . Breast cancer Mother 3  . Diabetes Father   . Heart disease Father   . Diabetes Sister   . Diabetes Brother   . Breast cancer Sister 70  . Colon cancer Paternal Aunt   . Breast cancer Maternal Aunt    Allergies  Allergen Reactions  . Betadine [Povidone Iodine]   . Latex Rash    Redness of skin  . Tape Rash    Redness of skin OK with hypofix tape and paper tape! NO transpore tape ,adhesive tape!   Prior to Admission medications   Medication Sig Start Date End Date Taking? Authorizing Provider  ALPRAZolam Duanne Moron) 0.25 MG tablet TAKE 1 TABLET BY MOUTH TWICE A DAY AS NEEDED FOR ANXIETY 10/16/19  Yes Janith Lima, MD  atorvastatin (LIPITOR) 10 MG tablet Take 1 tablet (10 mg total) by mouth daily. 09/14/19  Yes Janith Lima, MD  fluticasone (FLONASE) 50 MCG/ACT nasal spray 3-4 sprays once a week. Around peristomal skin--hold 5-6 inches away from skin 07/31/16  Yes [provider]  hydrocortisone 2.5 % cream APPLY TO AFFECTED AREA TWICE A DAY 04/02/19  Yes Janith Lima, MD  loperamide (IMODIUM) 2 MG capsule Take 1 capsule (2 mg total) by mouth 3 (three) times daily as needed for diarrhea or loose stools. 22/48/25  Yes Leighton Ruff, MD  olmesartan-hydrochlorothiazide (BENICAR HCT) 40-12.5 MG tablet Take 1 tablet by mouth daily. 09/14/19  Yes Janith Lima, MD  omeprazole (PRILOSEC) 20 MG capsule Take 1 capsule (20 mg total)  by mouth daily. 05/04/18  Yes Janith Lima, MD     Positive ROS: Otherwise negative  All other systems have been reviewed and were otherwise negative with the exception of those mentioned in the HPI and as above.  Physical Exam: Constitutional: Alert, well-appearing, no acute distress Ears: External ears without lesions or tenderness. Ear canals are clear bilaterally with intact, clear TMs.  Nasal: External nose without lesions. Septum with minimal deformity.. Clear nasal passages Oral: Lips and gums without lesions. Tongue and palate mucosa without lesions. Posterior oropharynx clear. Neck: She has a large right thyroid nodule that is easily palpable and measures approximately 3 to 4 cm in size.  She has no palpable supraclavicular adenopathy and no palpable higher jugular adenopathy.  Left thyroid lobe is reasonably normal to palpation although she apparently had nodules on the left side also.  She has had previous ultrasound in November 2018 that was compared to the present ultrasound and demonstrated enlargement of the right thyroid nodule. Respiratory: Breathing comfortably  Skin: No facial/neck lesions or rash noted.  Procedures  Assessment: Right thyroid nodule meets criteria for fine-needle aspirate.  Plan: We will plan on scheduling for an ultrasound-guided fine-needle aspirate in the next week or 2 through invasive radiology. She will follow-up following the fine-needle aspirate to discuss results.   Radene Journey, MD   CC:

## 2019-10-26 ENCOUNTER — Encounter: Payer: Self-pay | Admitting: Internal Medicine

## 2019-10-26 DIAGNOSIS — E041 Nontoxic single thyroid nodule: Secondary | ICD-10-CM

## 2019-10-31 NOTE — Telephone Encounter (Signed)
Pt is scheduled with [facility] and pt is aware

## 2019-11-08 ENCOUNTER — Ambulatory Visit
Admission: RE | Admit: 2019-11-08 | Discharge: 2019-11-08 | Disposition: A | Discharge status: Home / Self Care | Payer: Medicare Other | Source: Ambulatory Visit | Attending: Internal Medicine | Admitting: Internal Medicine

## 2019-11-08 ENCOUNTER — Encounter: Payer: Self-pay | Admitting: Internal Medicine

## 2019-11-08 ENCOUNTER — Other Ambulatory Visit: Payer: Self-pay

## 2019-11-08 DIAGNOSIS — Z1231 Encounter for screening mammogram for malignant neoplasm of breast: Secondary | ICD-10-CM

## 2019-11-08 DIAGNOSIS — N644 Mastodynia: Secondary | ICD-10-CM

## 2019-11-14 ENCOUNTER — Ambulatory Visit
Admission: RE | Admit: 2019-11-14 | Discharge: 2019-11-14 | Disposition: A | Discharge status: Home / Self Care | Payer: Medicare Other | Source: Ambulatory Visit | Attending: Otolaryngology | Admitting: Otolaryngology

## 2019-11-14 ENCOUNTER — Other Ambulatory Visit (HOSPITAL_COMMUNITY)
Admission: RE | Admit: 2019-11-14 | Discharge: 2019-11-14 | Disposition: A | Discharge status: Home / Self Care | Payer: Medicare Other | Source: Ambulatory Visit | Attending: Radiology | Admitting: Radiology

## 2019-11-14 DIAGNOSIS — E041 Nontoxic single thyroid nodule: Secondary | ICD-10-CM

## 2019-11-15 LAB — CYTOLOGY - NON PAP

## 2019-11-20 ENCOUNTER — Other Ambulatory Visit: Payer: Self-pay | Admitting: Internal Medicine

## 2019-11-20 DIAGNOSIS — N644 Mastodynia: Secondary | ICD-10-CM

## 2019-11-22 ENCOUNTER — Ambulatory Visit: Payer: Medicare Other | Admitting: Gastroenterology

## 2019-11-29 DIAGNOSIS — E041 Nontoxic single thyroid nodule: Secondary | ICD-10-CM | POA: Diagnosis not present

## 2019-11-29 DIAGNOSIS — Z888 Allergy status to other drugs, medicaments and biological substances status: Secondary | ICD-10-CM | POA: Diagnosis not present

## 2019-11-29 DIAGNOSIS — Z91040 Latex allergy status: Secondary | ICD-10-CM | POA: Diagnosis not present

## 2019-11-29 DIAGNOSIS — Z7289 Other problems related to lifestyle: Secondary | ICD-10-CM | POA: Diagnosis not present

## 2019-12-05 ENCOUNTER — Ambulatory Visit: Payer: Medicare Other

## 2019-12-05 ENCOUNTER — Other Ambulatory Visit: Payer: Self-pay

## 2019-12-05 ENCOUNTER — Ambulatory Visit
Admission: RE | Admit: 2019-12-05 | Discharge: 2019-12-05 | Disposition: A | Discharge status: Home / Self Care | Payer: Medicare Other | Source: Ambulatory Visit | Attending: Internal Medicine | Admitting: Internal Medicine

## 2019-12-05 DIAGNOSIS — N644 Mastodynia: Secondary | ICD-10-CM | POA: Diagnosis not present

## 2019-12-05 LAB — HM MAMMOGRAPHY

## 2019-12-20 ENCOUNTER — Ambulatory Visit: Payer: Medicare Other | Admitting: Gastroenterology

## 2020-01-03 ENCOUNTER — Encounter: Payer: Self-pay | Admitting: Internal Medicine

## 2020-01-03 ENCOUNTER — Ambulatory Visit: Payer: Medicare Other | Admitting: Critical Care Medicine

## 2020-01-03 ENCOUNTER — Other Ambulatory Visit: Payer: Self-pay

## 2020-01-03 VITALS — BP 122/84 | HR 81 | Temp 98.2°F | Resp 18 | Ht 64.0 in | Wt 188.0 lb

## 2020-01-03 DIAGNOSIS — E041 Nontoxic single thyroid nodule: Secondary | ICD-10-CM

## 2020-01-03 DIAGNOSIS — K51019 Ulcerative (chronic) pancolitis with unspecified complications: Secondary | ICD-10-CM

## 2020-01-03 DIAGNOSIS — I10 Essential (primary) hypertension: Secondary | ICD-10-CM | POA: Diagnosis not present

## 2020-01-03 MED ORDER — OLMESARTAN MEDOXOMIL-HCTZ 40-12.5 MG PO TABS: ORAL_TABLET | ORAL | 1 refills | Status: DC

## 2020-01-03 NOTE — Assessment & Plan Note (Signed)
History of ulcerative colitis please follow-up visit as planned with gastroenterology

## 2020-01-03 NOTE — Assessment & Plan Note (Signed)
History of essential hypertension but currently relatively normotensive with increased output from ileostomy.  I am concerned that the Benicar HCT may be drying this patient up too much and lowering the pressure.  Plan will be for the patient to hold the Benicar HCT for now and I requested the patient follow-up with gastroenterology as she has an upcoming appointment

## 2020-01-03 NOTE — Assessment & Plan Note (Signed)
Right thyroid nodule with prior normal thyroid function we will follow-up thyroid function at this visit

## 2020-01-03 NOTE — Progress Notes (Signed)
Subjective:    Patient ID: Latasha Wood, female    DOB: Sep 10, 1956, 64 y.o.   MRN: [id number]  This is a pleasant 64 year old female who has history of hypertension colitis status post total colectomy with ileostomy formation hyperlipidemia and a right thyroid nodule the biopsy was benign follicular hyperplasia with normal thyroid function  The patient came to the mobile clinic for a general evaluation after she left the workout center at the [facility] here.  The patient states that she has not been taking her blood pressure medicine recently as she feels dizzy and lightheaded when she records her blood pressure at home she shows ranges in the 100 120 range over 60 without medication.  Patient does note increased output from her ileostomy and he does take as needed Lomotil.  Patient has an upcoming appointment with gastroenterology for follow-up.  She states she is keeping up with her fluids at this time.  She denies any abdominal pain.  She denies any tremors.  She denies current reflux symptoms.   Past Medical History:  Diagnosis Date  . Anemia   . Anxiety   . Blood transfusion   . Colitis, ulcerative ([place])   . Colon polyps   . Hyperlipidemia   . Hypertension   . Ileostomy present ([place])   . Numbness and tingling    hands and feet bilat   . Shortness of breath dyspnea    talking or walking   . Type 2 diabetes mellitus with complication, without long-term current use of insulin ([place]) 09/05/2015   currently on no medications, (05/26/2016) pt denies diabetes.  States that she had one time high blood sugars d/t prednisone     Family History  Problem Relation Age of Onset  . Diabetes Mother   . Breast cancer Mother 22  . Diabetes Father   . Heart disease Father   . Diabetes Sister   . Diabetes Brother   . Breast cancer Sister 19  . Colon cancer Paternal Aunt   . Breast cancer Maternal Aunt      Social History   Socioeconomic History  . Marital status: Married    Spouse  name: Not on file  . Number of children: 2  . Years of education: 39  . Highest education level: Not on file  Occupational History  . 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 Smoking status: Never Smoker  . Smokeless tobacco: Never Used  Substance and Sexual Activity  . Alcohol use: Yes    Alcohol/week: 2.0 standard drinks    Types: 2 Standard drinks or equivalent per week    Comment: occ  . Drug use: No  . Sexual activity: Yes    Partners: Male  Other Topics Concern  . Not on file  Social History Narrative   HSG, [employer] - BA, [employer] for med tech.  Married '75. 2 dtrs - '79, '82; 1 grandchild. Marriage is in good health. No abuse issues.    Social Determinants of Health   Financial Resource Strain:   . Difficulty of Paying Living Expenses: Not on file  Food Insecurity:   . Worried About Charity fundraiser in the Last Year: Not on file  . Ran Out of Food in the Last Year: Not on file  Transportation Needs:   . Lack of Transportation (Medical): Not on file  . Lack of Transportation (Non-Medical): Not on file  Physical Activity:   .  Days of Exercise per Week: Not on file  . Minutes of Exercise per Session: Not on file  Stress:   . Feeling of Stress : Not on file  Social Connections:   . Frequency of Communication with Friends and Family: Not on file  . Frequency of Social Gatherings with Friends and Family: Not on file  . Attends Religious Services: Not on file  . Active Member of Clubs or Organizations: Not on file  . Attends Archivist Meetings: Not on file  . Marital Status: Not on file  Intimate Partner Violence:   . Fear of Current or Ex-Partner: Not on file  . Emotionally Abused: Not on file  . Physically Abused: Not on file  . Sexually Abused: Not on file     Allergies  Allergen Reactions  . Betadine [Povidone Iodine]   . Latex Rash    Redness of skin  . Tape Rash    Redness of skin OK  with hypofix tape and paper tape! NO transpore tape ,adhesive tape!     Outpatient Medications Prior to Visit  Medication Sig Dispense Refill  . ALPRAZolam (XANAX) 0.25 MG tablet TAKE 1 TABLET BY MOUTH TWICE A DAY AS NEEDED FOR ANXIETY 60 tablet 2  . fluticasone (FLONASE) 50 MCG/ACT nasal spray 3-4 sprays once a week. Around peristomal skin--hold 5-6 inches away from skin  2  . hydrocortisone 2.5 % cream APPLY TO AFFECTED AREA TWICE A DAY 28.35 g 1  . loperamide (IMODIUM) 2 MG capsule Take 1 capsule (2 mg total) by mouth 3 (three) times daily as needed for diarrhea or loose stools. 60 capsule 3  . olmesartan-hydrochlorothiazide (BENICAR HCT) 40-12.5 MG tablet Take 1 tablet by mouth daily. 90 tablet 1  . atorvastatin (LIPITOR) 10 MG tablet Take 1 tablet (10 mg total) by mouth daily. (Patient not taking: Reported on 01/03/2020) 90 tablet 1  . omeprazole (PRILOSEC) 20 MG capsule Take 1 capsule (20 mg total) by mouth daily. (Patient not taking: Reported on 01/03/2020) 90 capsule 1   No facility-administered medications prior to visit.      Review of Systems Constitutional:   No  weight loss, night sweats,  Fevers, chills, fatigue, lassitude. HEENT:   No headaches,  Difficulty swallowing,  Tooth/dental problems,  Sore throat,                No sneezing, itching, ear ache, nasal congestion, post nasal drip,   CV:   chest pain,  Orthopnea, PND, swelling in lower extremities, anasarca, dizziness, palpitations  GI  No heartburn, indigestion, abdominal pain, nausea, vomiting, diarrhea, change in bowel habits, loss of appetite, increase ileostomy output  Resp: No shortness of breath with exertion or at rest.  No excess mucus, no productive cough,  No non-productive cough,  No coughing up of blood.  No change in color of mucus.  No wheezing.  No chest wall deformity  Skin: no rash or lesions.  GU: no dysuria, change in color of urine, no urgency or frequency.  No flank pain.  MS:  No joint pain  or swelling.  No decreased range of motion.  No back pain.  Psych:  No change in mood or affect. No depression or anxiety.  No memory loss.     Objective:   Physical Exam Vitals:   01/03/20 0932  BP: 122/84  Pulse: 81  Resp: 18  Temp: 98.2 F (36.8 C)  TempSrc: Oral  SpO2: 97%  Weight: 188 lb (85.3 kg)  Height: 5' 4"  (1.626 m)    Gen: Pleasant, well-nourished, in no distress,  normal affect  ENT: No lesions,  mouth clear,  oropharynx clear, no postnasal drip  Neck: No JVD, no narotid bruits There is a nodule in the right lower lobe thyroid there is a nodule in the right  Lungs: No use of accessory muscles, no dullness to percussion, clear without rales or rhonchi  Cardiovascular: RRR, heart sounds normal, no murmur or gallops, no peripheral edema  Abdomen: soft and NT, no HSM,  BS normal  Musculoskeletal: No deformities, no cyanosis or clubbing  Neuro: alert, non focal  Skin: Warm, no lesions or rashes No lab data since November 2020       Assessment & Plan:  I personally reviewed all images and lab data in the [facility] system as well as any outside material available during this office visit and agree with the  radiology impressions.   Essential hypertension History of essential hypertension but currently relatively normotensive with increased output from ileostomy.  I am concerned that the Benicar HCT may be drying this patient up too much and lowering the pressure.  Plan will be for the patient to hold the Benicar HCT for now and I requested the patient follow-up with gastroenterology as she has an upcoming appointment  Ulcerative colitis ([place]) History of ulcerative colitis please follow-up visit as planned with gastroenterology  Right thyroid nodule Right thyroid nodule with prior normal thyroid function we will follow-up thyroid function at this visit   Latasha Wood was seen today for hypertension.  Diagnoses and all orders for this visit:  Essential  hypertension -     Comprehensive metabolic panel -     CBC with Differential/Platelet; Future -     olmesartan-hydrochlorothiazide (BENICAR HCT) 40-12.5 MG tablet; HOLD FOR NOW -     CBC with Differential/Platelet  Right thyroid nodule -     Thyroid Panel With TSH  Ulcerative pancolitis with complication ([place])   Plan will also be to change CBC thyroid panel and complete metabolic panel at this visit

## 2020-01-03 NOTE — Patient Instructions (Signed)
Hold your blood pressure medication for now  Lab work includes metabolic panel thyroid panel and complete blood count  Increase your fluid intake and keep your appointment follow-ups with gastroenterology and primary care

## 2020-01-14 ENCOUNTER — Other Ambulatory Visit: Payer: Self-pay

## 2020-01-14 ENCOUNTER — Encounter (HOSPITAL_COMMUNITY): Payer: Self-pay

## 2020-01-14 ENCOUNTER — Inpatient Hospital Stay (HOSPITAL_COMMUNITY)
Admission: EM | Admit: 2020-01-14 | Discharge: 2020-01-17 | DRG: 390 | Disposition: A | Discharge status: Home / Self Care | Payer: Medicare Other | Attending: Internal Medicine | Admitting: Internal Medicine

## 2020-01-14 DIAGNOSIS — Z9841 Cataract extraction status, right eye: Secondary | ICD-10-CM

## 2020-01-14 DIAGNOSIS — Z90710 Acquired absence of both cervix and uterus: Secondary | ICD-10-CM | POA: Diagnosis not present

## 2020-01-14 DIAGNOSIS — Z8249 Family history of ischemic heart disease and other diseases of the circulatory system: Secondary | ICD-10-CM

## 2020-01-14 DIAGNOSIS — Z86010 Personal history of colonic polyps: Secondary | ICD-10-CM

## 2020-01-14 DIAGNOSIS — K56609 Unspecified intestinal obstruction, unspecified as to partial versus complete obstruction: Secondary | ICD-10-CM | POA: Diagnosis not present

## 2020-01-14 DIAGNOSIS — E119 Type 2 diabetes mellitus without complications: Secondary | ICD-10-CM | POA: Diagnosis present

## 2020-01-14 DIAGNOSIS — Z888 Allergy status to other drugs, medicaments and biological substances status: Secondary | ICD-10-CM | POA: Diagnosis not present

## 2020-01-14 DIAGNOSIS — Z91040 Latex allergy status: Secondary | ICD-10-CM

## 2020-01-14 DIAGNOSIS — F419 Anxiety disorder, unspecified: Secondary | ICD-10-CM | POA: Diagnosis present

## 2020-01-14 DIAGNOSIS — R7303 Prediabetes: Secondary | ICD-10-CM | POA: Diagnosis not present

## 2020-01-14 DIAGNOSIS — Z961 Presence of intraocular lens: Secondary | ICD-10-CM | POA: Diagnosis not present

## 2020-01-14 DIAGNOSIS — E785 Hyperlipidemia, unspecified: Secondary | ICD-10-CM | POA: Diagnosis present

## 2020-01-14 DIAGNOSIS — Z20822 Contact with and (suspected) exposure to covid-19: Secondary | ICD-10-CM | POA: Diagnosis not present

## 2020-01-14 DIAGNOSIS — I10 Essential (primary) hypertension: Secondary | ICD-10-CM | POA: Diagnosis present

## 2020-01-14 DIAGNOSIS — Z79899 Other long term (current) drug therapy: Secondary | ICD-10-CM | POA: Diagnosis not present

## 2020-01-14 DIAGNOSIS — Z932 Ileostomy status: Secondary | ICD-10-CM | POA: Diagnosis not present

## 2020-01-14 DIAGNOSIS — H9203 Otalgia, bilateral: Secondary | ICD-10-CM | POA: Diagnosis not present

## 2020-01-14 DIAGNOSIS — K51019 Ulcerative (chronic) pancolitis with unspecified complications: Secondary | ICD-10-CM | POA: Diagnosis not present

## 2020-01-14 DIAGNOSIS — Z0189 Encounter for other specified special examinations: Secondary | ICD-10-CM

## 2020-01-14 DIAGNOSIS — Z91048 Other nonmedicinal substance allergy status: Secondary | ICD-10-CM | POA: Diagnosis not present

## 2020-01-14 DIAGNOSIS — Z800 Family history of malignant neoplasm of digestive organs: Secondary | ICD-10-CM | POA: Diagnosis not present

## 2020-01-14 DIAGNOSIS — Z833 Family history of diabetes mellitus: Secondary | ICD-10-CM

## 2020-01-14 DIAGNOSIS — Z4682 Encounter for fitting and adjustment of non-vascular catheter: Secondary | ICD-10-CM | POA: Diagnosis not present

## 2020-01-14 DIAGNOSIS — K5190 Ulcerative colitis, unspecified, without complications: Secondary | ICD-10-CM | POA: Diagnosis present

## 2020-01-14 DIAGNOSIS — Z9049 Acquired absence of other specified parts of digestive tract: Secondary | ICD-10-CM

## 2020-01-14 DIAGNOSIS — Z803 Family history of malignant neoplasm of breast: Secondary | ICD-10-CM | POA: Diagnosis not present

## 2020-01-14 DIAGNOSIS — K5669 Other partial intestinal obstruction: Secondary | ICD-10-CM | POA: Diagnosis not present

## 2020-01-14 DIAGNOSIS — D649 Anemia, unspecified: Secondary | ICD-10-CM | POA: Diagnosis present

## 2020-01-14 DIAGNOSIS — Z03818 Encounter for observation for suspected exposure to other biological agents ruled out: Secondary | ICD-10-CM | POA: Diagnosis not present

## 2020-01-14 LAB — CBC WITH DIFFERENTIAL/PLATELET
Abs Immature Granulocytes: 0.03 10*3/uL (ref 0.00–0.07)
Basophils Absolute: 0 10*3/uL (ref 0.0–0.1)
Basophils Relative: 0 %
Eosinophils Absolute: 0.1 10*3/uL (ref 0.0–0.5)
Eosinophils Relative: 1 %
HCT: 41.2 % (ref 36.0–46.0)
Hemoglobin: 13.5 g/dL (ref 12.0–15.0)
Immature Granulocytes: 0 %
Lymphocytes Relative: 10 %
Lymphs Abs: 1.3 10*3/uL (ref 0.7–4.0)
MCH: 28.8 pg (ref 26.0–34.0)
MCHC: 32.8 g/dL (ref 30.0–36.0)
MCV: 87.8 fL (ref 80.0–100.0)
Monocytes Absolute: 0.3 10*3/uL (ref 0.1–1.0)
Monocytes Relative: 3 %
Neutro Abs: 11.1 10*3/uL — ABNORMAL HIGH (ref 1.7–7.7)
Neutrophils Relative %: 86 %
Platelets: 385 10*3/uL (ref 150–400)
RBC: 4.69 MIL/uL (ref 3.87–5.11)
RDW: 13.3 % (ref 11.5–15.5)
WBC: 12.8 10*3/uL — ABNORMAL HIGH (ref 4.0–10.5)
nRBC: 0 % (ref 0.0–0.2)

## 2020-01-14 MED ORDER — HYDROMORPHONE HCL 1 MG/ML IJ SOLN
1.0000 mg | Freq: Once | INTRAMUSCULAR | Status: AC
  Administered 2020-01-14: 1 mg via INTRAVENOUS
  Filled 2020-01-14: qty 1

## 2020-01-14 MED ORDER — SODIUM CHLORIDE 0.9 % IV BOLUS
1000.0000 mL | Freq: Once | INTRAVENOUS | Status: AC
  Administered 2020-01-14: 1000 mL via INTRAVENOUS

## 2020-01-14 MED ORDER — ONDANSETRON HCL 4 MG/2ML IJ SOLN
4.0000 mg | Freq: Once | INTRAMUSCULAR | Status: AC
  Administered 2020-01-14: 4 mg via INTRAVENOUS
  Filled 2020-01-14: qty 2

## 2020-01-14 NOTE — ED Triage Notes (Signed)
Patient arrived stating she has abdominal cramping that started today around two. Reports one episode of vomiting. States she has not had any output in her bag since one today. Concerned about a bowel obstruction.

## 2020-01-15 ENCOUNTER — Inpatient Hospital Stay (HOSPITAL_COMMUNITY): Payer: Medicare Other

## 2020-01-15 ENCOUNTER — Emergency Department (HOSPITAL_COMMUNITY): Payer: Medicare Other

## 2020-01-15 ENCOUNTER — Encounter (HOSPITAL_COMMUNITY): Payer: Self-pay

## 2020-01-15 DIAGNOSIS — Z800 Family history of malignant neoplasm of digestive organs: Secondary | ICD-10-CM | POA: Diagnosis not present

## 2020-01-15 DIAGNOSIS — Z20822 Contact with and (suspected) exposure to covid-19: Secondary | ICD-10-CM | POA: Diagnosis present

## 2020-01-15 DIAGNOSIS — Z932 Ileostomy status: Secondary | ICD-10-CM

## 2020-01-15 DIAGNOSIS — E785 Hyperlipidemia, unspecified: Secondary | ICD-10-CM | POA: Diagnosis present

## 2020-01-15 DIAGNOSIS — Z91048 Other nonmedicinal substance allergy status: Secondary | ICD-10-CM | POA: Diagnosis not present

## 2020-01-15 DIAGNOSIS — Z0189 Encounter for other specified special examinations: Secondary | ICD-10-CM | POA: Diagnosis not present

## 2020-01-15 DIAGNOSIS — F419 Anxiety disorder, unspecified: Secondary | ICD-10-CM | POA: Diagnosis present

## 2020-01-15 DIAGNOSIS — Z803 Family history of malignant neoplasm of breast: Secondary | ICD-10-CM | POA: Diagnosis not present

## 2020-01-15 DIAGNOSIS — K56609 Unspecified intestinal obstruction, unspecified as to partial versus complete obstruction: Secondary | ICD-10-CM | POA: Diagnosis present

## 2020-01-15 DIAGNOSIS — Z888 Allergy status to other drugs, medicaments and biological substances status: Secondary | ICD-10-CM | POA: Diagnosis not present

## 2020-01-15 DIAGNOSIS — Z90710 Acquired absence of both cervix and uterus: Secondary | ICD-10-CM | POA: Diagnosis not present

## 2020-01-15 DIAGNOSIS — H9203 Otalgia, bilateral: Secondary | ICD-10-CM | POA: Diagnosis present

## 2020-01-15 DIAGNOSIS — Z9841 Cataract extraction status, right eye: Secondary | ICD-10-CM | POA: Diagnosis not present

## 2020-01-15 DIAGNOSIS — Z79899 Other long term (current) drug therapy: Secondary | ICD-10-CM | POA: Diagnosis not present

## 2020-01-15 DIAGNOSIS — I10 Essential (primary) hypertension: Secondary | ICD-10-CM | POA: Diagnosis present

## 2020-01-15 DIAGNOSIS — Z961 Presence of intraocular lens: Secondary | ICD-10-CM | POA: Diagnosis present

## 2020-01-15 DIAGNOSIS — Z9049 Acquired absence of other specified parts of digestive tract: Secondary | ICD-10-CM | POA: Diagnosis not present

## 2020-01-15 DIAGNOSIS — R7303 Prediabetes: Secondary | ICD-10-CM | POA: Diagnosis not present

## 2020-01-15 DIAGNOSIS — Z86010 Personal history of colonic polyps: Secondary | ICD-10-CM | POA: Diagnosis not present

## 2020-01-15 DIAGNOSIS — D649 Anemia, unspecified: Secondary | ICD-10-CM | POA: Diagnosis present

## 2020-01-15 DIAGNOSIS — Z833 Family history of diabetes mellitus: Secondary | ICD-10-CM | POA: Diagnosis not present

## 2020-01-15 DIAGNOSIS — K51019 Ulcerative (chronic) pancolitis with unspecified complications: Secondary | ICD-10-CM

## 2020-01-15 DIAGNOSIS — E119 Type 2 diabetes mellitus without complications: Secondary | ICD-10-CM | POA: Diagnosis present

## 2020-01-15 DIAGNOSIS — Z91040 Latex allergy status: Secondary | ICD-10-CM | POA: Diagnosis not present

## 2020-01-15 DIAGNOSIS — Z8249 Family history of ischemic heart disease and other diseases of the circulatory system: Secondary | ICD-10-CM | POA: Diagnosis not present

## 2020-01-15 LAB — BASIC METABOLIC PANEL
Anion gap: 9 (ref 5–15)
BUN: 14 mg/dL (ref 8–23)
CO2: 24 mmol/L (ref 22–32)
Calcium: 8.7 mg/dL — ABNORMAL LOW (ref 8.9–10.3)
Chloride: 105 mmol/L (ref 98–111)
Creatinine, Ser: 1.1 mg/dL — ABNORMAL HIGH (ref 0.44–1.00)
GFR calc Af Amer: >60 mL/min (ref >60)
GFR calc non Af Amer: 53 mL/min — ABNORMAL LOW (ref >60)
Glucose, Bld: 175 mg/dL — ABNORMAL HIGH (ref 70–99)
Potassium: 4.4 mmol/L (ref 3.5–5.1)
Sodium: 138 mmol/L (ref 135–145)

## 2020-01-15 LAB — COMPREHENSIVE METABOLIC PANEL
ALT: 21 U/L (ref 0–44)
AST: 25 U/L (ref 15–41)
Albumin: 4.1 g/dL (ref 3.5–5.0)
Alkaline Phosphatase: 90 U/L (ref 38–126)
Anion gap: 8 (ref 5–15)
BUN: 16 mg/dL (ref 8–23)
CO2: 26 mmol/L (ref 22–32)
Calcium: 9.6 mg/dL (ref 8.9–10.3)
Chloride: 102 mmol/L (ref 98–111)
Creatinine, Ser: 1.18 mg/dL — ABNORMAL HIGH (ref 0.44–1.00)
GFR calc Af Amer: 56 mL/min — ABNORMAL LOW (ref >60)
GFR calc non Af Amer: 49 mL/min — ABNORMAL LOW (ref >60)
Glucose, Bld: 139 mg/dL — ABNORMAL HIGH (ref 70–99)
Potassium: 4.3 mmol/L (ref 3.5–5.1)
Sodium: 136 mmol/L (ref 135–145)
Total Bilirubin: 0.5 mg/dL (ref 0.3–1.2)
Total Protein: 8.2 g/dL — ABNORMAL HIGH (ref 6.5–8.1)

## 2020-01-15 LAB — CBC
HCT: 37.1 % (ref 36.0–46.0)
Hemoglobin: 12.3 g/dL (ref 12.0–15.0)
MCH: 28.9 pg (ref 26.0–34.0)
MCHC: 33.2 g/dL (ref 30.0–36.0)
MCV: 87.1 fL (ref 80.0–100.0)
Platelets: 320 10*3/uL (ref 150–400)
RBC: 4.26 MIL/uL (ref 3.87–5.11)
RDW: 13.3 % (ref 11.5–15.5)
WBC: 9.7 10*3/uL (ref 4.0–10.5)
nRBC: 0 % (ref 0.0–0.2)

## 2020-01-15 LAB — LIPASE, BLOOD: Lipase: 28 U/L (ref 11–51)

## 2020-01-15 LAB — URINALYSIS, ROUTINE W REFLEX MICROSCOPIC
Bilirubin Urine: NEGATIVE
Glucose, UA: NEGATIVE mg/dL
Hgb urine dipstick: NEGATIVE
Ketones, ur: NEGATIVE mg/dL
Leukocytes,Ua: NEGATIVE
Nitrite: NEGATIVE
Protein, ur: NEGATIVE mg/dL
Specific Gravity, Urine: 1.039 — ABNORMAL HIGH (ref 1.005–1.030)
pH: 5 (ref 5.0–8.0)

## 2020-01-15 LAB — LACTIC ACID, PLASMA: Lactic Acid, Venous: 1.8 mmol/L (ref 0.5–1.9)

## 2020-01-15 LAB — SARS CORONAVIRUS 2 (TAT 6-24 HRS): SARS Coronavirus 2: NEGATIVE

## 2020-01-15 MED ORDER — HYDROMORPHONE HCL 1 MG/ML IJ SOLN
1.0000 mg | Freq: Once | INTRAMUSCULAR | Status: AC
  Administered 2020-01-15: 1 mg via INTRAVENOUS
  Filled 2020-01-15: qty 1

## 2020-01-15 MED ORDER — SODIUM CHLORIDE 0.9 % IV SOLN: INTRAVENOUS | Status: DC

## 2020-01-15 MED ORDER — FENTANYL CITRATE (PF) 100 MCG/2ML IJ SOLN: 12.5000 ug | Freq: Once | INTRAMUSCULAR | Status: DC

## 2020-01-15 MED ORDER — SODIUM CHLORIDE (PF) 0.9 % IJ SOLN
INTRAMUSCULAR | Status: AC
  Filled 2020-01-15: qty 50

## 2020-01-15 MED ORDER — ONDANSETRON HCL 4 MG/2ML IJ SOLN
4.0000 mg | Freq: Four times a day (QID) | INTRAMUSCULAR | Status: DC | PRN
  Administered 2020-01-15: 4 mg via INTRAVENOUS
  Filled 2020-01-15: qty 2

## 2020-01-15 MED ORDER — LACTATED RINGERS IV BOLUS: 1000.0000 mL | Freq: Three times a day (TID) | INTRAVENOUS | Status: AC | PRN

## 2020-01-15 MED ORDER — IOHEXOL 300 MG/ML  SOLN
100.0000 mL | Freq: Once | INTRAMUSCULAR | Status: AC | PRN
  Administered 2020-01-15: 01:00:00 100 mL via INTRAVENOUS

## 2020-01-15 MED ORDER — SODIUM CHLORIDE 0.9 % IV SOLN
8.0000 mg | Freq: Four times a day (QID) | INTRAVENOUS | Status: DC | PRN
  Filled 2020-01-15: qty 4

## 2020-01-15 MED ORDER — PROCHLORPERAZINE EDISYLATE 10 MG/2ML IJ SOLN
5.0000 mg | INTRAMUSCULAR | Status: DC | PRN
  Administered 2020-01-15: 10 mg via INTRAVENOUS
  Filled 2020-01-15: qty 2

## 2020-01-15 MED ORDER — HYDROMORPHONE HCL 1 MG/ML IJ SOLN
1.0000 mg | INTRAMUSCULAR | Status: AC | PRN
  Administered 2020-01-15: 1 mg via INTRAVENOUS
  Administered 2020-01-15: 0.5 mg via INTRAVENOUS
  Filled 2020-01-15 (×2): qty 1

## 2020-01-15 MED ORDER — PHENOL 1.4 % MT LIQD: 2.0000 | OROMUCOSAL | Status: DC | PRN

## 2020-01-15 MED ORDER — DIATRIZOATE MEGLUMINE & SODIUM 66-10 % PO SOLN
90.0000 mL | Freq: Once | ORAL | Status: AC
  Administered 2020-01-15: 90 mL via NASOGASTRIC
  Filled 2020-01-15: qty 90

## 2020-01-15 MED ORDER — ENOXAPARIN SODIUM 40 MG/0.4ML ~~LOC~~ SOLN
40.0000 mg | Freq: Every day | SUBCUTANEOUS | Status: DC
  Administered 2020-01-15 – 2020-01-16 (×2): 40 mg via SUBCUTANEOUS
  Filled 2020-01-15 (×3): qty 0.4

## 2020-01-15 MED ORDER — MENTHOL 3 MG MT LOZG: 1.0000 | LOZENGE | OROMUCOSAL | Status: DC | PRN

## 2020-01-15 MED ORDER — ACETAMINOPHEN 325 MG PO TABS
650.0000 mg | ORAL_TABLET | Freq: Four times a day (QID) | ORAL | Status: DC | PRN
  Administered 2020-01-16 – 2020-01-17 (×3): 650 mg via ORAL
  Filled 2020-01-15 (×4): qty 2

## 2020-01-15 MED ORDER — LIP MEDEX EX OINT
1.0000 "application " | TOPICAL_OINTMENT | Freq: Two times a day (BID) | CUTANEOUS | Status: DC
  Administered 2020-01-15 – 2020-01-17 (×5): 1 via TOPICAL
  Filled 2020-01-15 (×3): qty 7

## 2020-01-15 MED ORDER — LABETALOL HCL 5 MG/ML IV SOLN
10.0000 mg | INTRAVENOUS | Status: DC | PRN
  Filled 2020-01-15: qty 4

## 2020-01-15 MED ORDER — MAGIC MOUTHWASH
15.0000 mL | Freq: Four times a day (QID) | ORAL | Status: DC | PRN
  Filled 2020-01-15: qty 15

## 2020-01-15 MED ORDER — ACETAMINOPHEN 650 MG RE SUPP: 650.0000 mg | Freq: Four times a day (QID) | RECTAL | Status: DC | PRN

## 2020-01-15 MED ORDER — LACTATED RINGERS IV BOLUS
1000.0000 mL | Freq: Once | INTRAVENOUS | Status: AC
  Administered 2020-01-15: 1000 mL via INTRAVENOUS

## 2020-01-15 NOTE — Consult Note (Signed)
[facility] Nurse ostomy consult note Stoma type/location: RLQ ileostomy Stomal assessment/size: 1 3/8" pink and moist Peristomal assessment: pouch intact  Patient is independent with care and brought her own nonformulary supplies.  DUe to skin sensitivity she prefers to use her own.  Treatment options for stomal/peristomal skin: independent  Output scant brown stool Ostomy pouching: 1pc. coloplast  Education provided: none patient is independent with no ostomy needs.  States she feels nauseated.   Enrolled patient in [place] program: No Will not follow at this time.  Please re-consult if needed.  Domenic Moras MSN, RN, FNP-BC [facility] Wound, Ostomy, Continence Nurse Pager [phone]

## 2020-01-15 NOTE — Consult Note (Signed)
Latasha Wood  [birth year] [id number]  CARE TEAM:  PCP: Janith Lima, MD  Outpatient Care Team: Patient Care Team: Janith Lima, MD as PCP - General (Internal Medicine) Lafayette Dragon, MD (Inactive) (Gastroenterology) Brien Few, MD (Obstetrics and Gynecology) Kem Parkinson, MD (Ophthalmology)  Inpatient Treatment Team: Treatment Team: Attending Provider: Orpah Greek, MD; Physician Assistant: Antonietta Breach, PA-C; Registered Nurse: Girtha Rm, RN; Technician: Luella Cook, EMT; Consulting Physician: Nolon Nations, MD; Consulting Physician: Leighton Ruff, MD   This patient is a 64 y.o.female who presents today for surgical evaluation at the request of Dr Betsey Holiday.   Chief complaint / Reason for evaluation: SBO  Woman with history of ulcerative colitis followed by with our gastroenterology.  Failed medical management requiring total proctocolectomy with ileostomy in 2016.  This was complicated by ileostomy ischemia requiring resection and preparation.  Then delayed ischemia and stricturing requiring relocation 2017.  Patient has a history of bowel obstructions treated nonoperatively in 2005 and 2012.  Patient felt feelings of bloating and discomfort with absent ostomy output.  Worsening crampy pain.  Nausea and vomiting went to the emergency room.  Examination and CT scan concerning for bowel obstruction.  Hospitalization requested.  Patient denies major issues with her ileostomy since the resection relocations in 2017.  No personal nor family history of GI/colon cancer, irritable bowel syndrome, allergy such as Celiac Sprue, dietary/dairy problems, colitis, ulcers nor gastritis.  No recent sick contacts/gastroenteritis.  No travel outside the country.  No changes in diet.  No dysphagia to solids or liquids.  No significant heartburn or reflux.  No hematochezia, hematemesis, coffee ground emesis.  No evidence of prior gastric/peptic  ulceration.   Assessment  Latasha Wood  64 y.o. female       Problem List:  Principal Problem:   SBO (small bowel obstruction) (HCC) Active Problems:   Hyperlipidemia LDL goal <70   Anxiety   Prediabetes   Ulcerative colitis ([place])   Otalgia of both ears   Ileostomy in place [facility])   Small bowel obstruction most likely due to adhesions related to her numerous prior abdominal surgeries including proctocolectomy with ileostomy resection and relocation.  Plan:  Medical admission and stabilization.  IV fluid rehydration.  Small bowel protocol with nasogastric tube.  We will try to avoid any surgery since she has had numerous prior operations and adhesive burden is probably moderate.  -VTE prophylaxis- SCDs, etc -mobilize as tolerated to help recovery  35 minutes spent in review, evaluation, examination, counseling, and coordination of care.  More than 50% of that time was spent in counseling.  Adin Hector, MD, FACS, MASCRS Gastrointestinal and Minimally Invasive Surgery  [facility] [address], [place],  [address] [phone] Main / Paging [phone] Fax     01/15/2020      Past Medical History:  Diagnosis Date  . Anemia   . Anxiety   . Blood transfusion   . Colitis, ulcerative ([place])   . Colon polyps   . Hyperlipidemia   . Hypertension   . Ileostomy present ([place])   . Numbness and tingling    hands and feet bilat   . Shortness of breath dyspnea    talking or walking   . Type 2 diabetes mellitus with complication, without long-term current use of insulin ([place]) 09/05/2015   currently on no medications, (05/26/2016) pt denies diabetes.  States that she had one time high blood sugars  d/t prednisone    Past Surgical History:  Procedure Laterality Date  . ABDOMINAL HYSTERECTOMY  2000  . ABDOMINAL SURGERY    . BREAST CYST ASPIRATION    . CATARACT EXTRACTION W/PHACO Right 10/20/2017   Procedure:  CATARACT EXTRACTION PHACO AND INTRAOCULAR LENS PLACEMENT (IOC);  Surgeon: [name], MD;  Location: [facility] ORS;  Service: Ophthalmology;  Laterality: Right;  [place] 00:50.1 AP% 13.2 CDE 6.61 Fluid Pack lot # [id number]  . COLON SURGERY     colostomy  . cortisol shot     in left shoulder  . DILATION AND CURETTAGE OF UTERUS     most likely after miscarriage  . fibrocystic breast disease     q 6 month mammogram  . gravida 6 para 2     all SAB  . hyadiform mole    . ILEO LOOP DIVERSION N/A 09/25/2015   Procedure: ILEO LOOP COLOSTOMY;  Surgeon: Leighton Ruff, MD;  Location: [facility]S;  Service: General;  Laterality: N/A;  . ILEOSTOMY CLOSURE N/A 10/08/2015   Procedure: ILEOSTOMY REVISION;  Surgeon: Leighton Ruff, MD;  Location: [facility]S;  Service: General;  Laterality: N/A;  . ILEOSTOMY CLOSURE N/A 08/20/2016   Procedure: OPEN RELOCATION OF ILEOSTOMY;  Surgeon: Leighton Ruff, MD;  Location: [facility];  Service: General;  Laterality: N/A;  . IR GENERIC HISTORICAL  09/01/2016   IR US GUIDE VASC ACCESS RIGHT 09/01/2016 Ascencion Dike, PA-C [facility]  . IR GENERIC HISTORICAL  09/01/2016   IR FLUORO GUIDE CV LINE RIGHT 09/01/2016 Ascencion Dike, PA-C [facility]  . KNEE ARTHROSCOPY W/ MENISCAL REPAIR     right knee ([place])  . LAPAROSCOPIC SMALL BOWEL RESECTION N/A 09/25/2015   Procedure: LAPAROSCOPIC BOWEL RESECTION TIMES TWO;  Surgeon: Leighton Ruff, MD;  Location: [facility];  Service: General;  Laterality: N/A;  . ROBOTIC ASSISTED LAPAROSCOPIC LYSIS OF ADHESION N/A 09/25/2015   Procedure: XI ROBOTIC ASSISTED LAPAROSCOPIC LYSIS OF ADHESION;  Surgeon: Leighton Ruff, MD;  Location: [facility];  Service: General;  Laterality: N/A;  90 minutes  . Small bowel obstruction      Social History   Socioeconomic History  . Marital status: Married    Spouse name: Not on file  . Number of children: 2  . Years of education: 40  . Highest education level: Not on file  Occupational History  . 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 Smoking status: Never Smoker  . Smokeless tobacco: Never Used  Substance and Sexual Activity  . Alcohol use: Yes    Alcohol/week: 2.0 standard drinks    Types: 2 Standard drinks or equivalent per week    Comment: occ  . Drug use: No  . Sexual activity: Yes    Partners: Male  Other Topics Concern  . Not on file  Social History Narrative   HSG, [employer] - BA, [employer] for med tech.  Married '75. 2 dtrs - '79, '82; 1 grandchild. Marriage is in good health. No abuse issues.    Social Determinants of Health   Financial Resource Strain:   . Difficulty of Paying Living Expenses: Not on file  Food Insecurity:   . Worried About Charity fundraiser in the Last Year: Not on file  . Ran Out of Food in the Last Year: Not on file  Transportation Needs:   . Lack of Transportation (Medical): Not on file  . Lack of Transportation (Non-Medical): Not  on file  Physical Activity:   . Days of Exercise per Week: Not on file  . Minutes of Exercise per Session: Not on file  Stress:   . Feeling of Stress : Not on file  Social Connections:   . Frequency of Communication with Friends and Family: Not on file  . Frequency of Social Gatherings with Friends and Family: Not on file  . Attends Religious Services: Not on file  . Active Member of Clubs or Organizations: Not on file  . Attends Archivist Meetings: Not on file  . Marital Status: Not on file  Intimate Partner Violence:   . Fear of Current or Ex-Partner: Not on file  . Emotionally Abused: Not on file  . Physically Abused: Not on file  . Sexually Abused: Not on file    Family History  Problem Relation Age of Onset  . Diabetes Mother   . Breast cancer Mother 32  . Diabetes Father   . Heart disease Father   . Diabetes Sister   . Diabetes Brother   . Breast cancer Sister 73  . Colon cancer Paternal Aunt   . Breast cancer Maternal Aunt      Current Facility-Administered Medications  Medication Dose Route Frequency Provider Last Rate Last Admin  . diatrizoate meglumine-sodium (GASTROGRAFIN) 66-10 % solution 90 mL  90 mL Per NG tube Once Pollina, Gwenyth Allegra, MD      . HYDROmorphone (DILAUDID) injection 1 mg  1 mg Intravenous Once Antonietta Breach, PA-C      . lactated ringers bolus 1,000 mL  1,000 mL Intravenous Once Michael Boston, MD      . lactated ringers bolus 1,000 mL  1,000 mL Intravenous Q8H PRN Michael Boston, MD      . lip balm (CARMEX) ointment 1 application  1 application Topical BID Michael Boston, MD      . magic mouthwash  15 mL Oral QID PRN Michael Boston, MD      . menthol-cetylpyridinium (CEPACOL) lozenge 3 mg  1 lozenge Oral PRN Michael Boston, MD      . ondansetron [facility]) injection 4 mg  4 mg Intravenous Q6H PRN Michael Boston, MD       Or  . ondansetron (ZOFRAN) 8 mg in sodium chloride 0.9 % 50 mL IVPB  8 mg Intravenous Q6H PRN Michael Boston, MD      . phenol (CHLORASEPTIC) mouth spray 2 spray  2 spray Mouth/Throat PRN Michael Boston, MD      . prochlorperazine (COMPAZINE) injection 5-10 mg  5-10 mg Intravenous Q4H PRN Michael Boston, MD      . sodium chloride (PF) 0.9 % injection            Current Outpatient Medications  Medication Sig Dispense Refill  . cholecalciferol (VITAMIN D3) 25 MCG (1000 UNIT) tablet Take 1,000 Units by mouth daily.    Marland Kitchen loperamide (IMODIUM) 2 MG capsule Take 1 capsule (2 mg total) by mouth 3 (three) times daily as needed for diarrhea or loose stools. 60 capsule 3  . Multiple Vitamin (MULTIVITAMIN WITH MINERALS) TABS tablet Take 1 tablet by mouth daily.    Marland Kitchen ALPRAZolam (XANAX) 0.25 MG tablet TAKE 1 TABLET BY MOUTH TWICE A DAY AS NEEDED FOR ANXIETY (Patient not taking: Reported on 01/15/2020) 60 tablet 2  . atorvastatin (LIPITOR) 10 MG tablet Take 1 tablet (10 mg total) by mouth daily. (Patient not taking: Reported on 01/03/2020) 90 tablet 1  . hydrocortisone 2.5 % cream APPLY TO  AFFECTED  AREA TWICE A DAY (Patient not taking: Reported on 01/15/2020) 28.35 g 1  . olmesartan-hydrochlorothiazide (BENICAR HCT) 40-12.5 MG tablet HOLD FOR NOW 90 tablet 1  . omeprazole (PRILOSEC) 20 MG capsule Take 1 capsule (20 mg total) by mouth daily. (Patient not taking: Reported on 01/03/2020) 90 capsule 1     Allergies  Allergen Reactions  . Betadine [Povidone Iodine]   . Latex Rash    Redness of skin  . Tape Rash    Redness of skin OK with hypofix tape and paper tape! NO transpore tape ,adhesive tape!    ROS:   All other systems reviewed & are negative except per HPI or as noted below: Constitutional:  No fevers, chills, sweats.  Weight stable Eyes:  No vision changes, No discharge HENT:  No sore throats, nasal drainage Lymph: No neck swelling, No bruising easily Pulmonary:  No cough, productive sputum CV: No orthopnea, PND  No exertional chest/neck/shoulder/arm pain. GI:  No personal nor family history of GI/colon cancer,irritable bowel syndrome, allergy such as Celiac Sprue, dietary/dairy problems, colitis, ulcers nor gastritis.  No recent sick contacts/gastroenteritis.  No travel outside the country.  No changes in diet. Renal: No UTIs, No hematuria Genital:  No drainage, bleeding, masses Musculoskeletal: No severe joint pain.  Good ROM major joints Skin:  No sores or lesions.  No rashes Heme/Lymph:  No easy bleeding.  No swollen lymph nodes Neuro: No focal weakness/numbness.  No seizures Psych: No suicidal ideation.  No hallucinations  BP (!) 152/96   Pulse 66   Temp 98 F (36.7 C) (Oral)   Resp 15   SpO2 94%   Physical Exam: General: Pt awake/alert in mild major acute distress Eyes: PERRL, normal EOM. Sclera nonicteric Neuro: CN II-XII intact w/o focal sensory/motor deficits. Lymph: No head/neck/groin lymphadenopathy Psych:  No delerium/psychosis/paranoia.  Oriented x4 HENT: Normocephalic, Mucus membranes moist.  No thrush.   Neck: Supple, No tracheal deviation.  No  obvious thyromegaly Chest: No pain to chest wall compression.  Good respiratory excursion.  No audible wheezing CV:  Pulses intact.   Regular rhythm.  No major extremity edema Abdomen: Soft, moderately distended nondistended.   Nontender.  Ileostomy in place no incarcerated hernias. Gen:  No inguinal hernias.  No inguinal lymphadenopathy.   Ext: No obvious deformity or contracture no significant edema.  No cyanosis Skin: No petechiae / purpurea.  No major sores.  Warm and dry Musculoskeletal: No severe joint pain.  Good ROM major joints   Results:   Labs: Results for orders placed or performed during the hospital encounter of 01/14/20 (from the past 48 hour(s))  CBC with Differential     Status: Abnormal   Collection Time: 01/14/20 11:48 PM  Result Value Ref Range   WBC 12.8 (H) 4.0 - 10.5 K/uL   RBC 4.69 3.87 - 5.11 MIL/uL   Hemoglobin 13.5 12.0 - 15.0 g/dL   HCT 41.2 36.0 - 46.0 %   MCV 87.8 80.0 - 100.0 fL   MCH 28.8 26.0 - 34.0 pg   MCHC 32.8 30.0 - 36.0 g/dL   RDW 13.3 11.5 - 15.5 %   Platelets 385 150 - 400 K/uL   nRBC 0.0 0.0 - 0.2 %   Neutrophils Relative % 86 %   Neutro Abs 11.1 (H) 1.7 - 7.7 K/uL   Lymphocytes Relative 10 %   Lymphs Abs 1.3 0.7 - 4.0 K/uL   Monocytes Relative 3 %   Monocytes Absolute 0.3 0.1 - 1.0  K/uL   Eosinophils Relative 1 %   Eosinophils Absolute 0.1 0.0 - 0.5 K/uL   Basophils Relative 0 %   Basophils Absolute 0.0 0.0 - 0.1 K/uL   Immature Granulocytes 0 %   Abs Immature Granulocytes 0.03 0.00 - 0.07 K/uL    Comment: Performed at [facility], [place] [address], [place], [place] [address]  Comprehensive metabolic panel     Status: Abnormal   Collection Time: 01/14/20 11:48 PM  Result Value Ref Range   Sodium 136 135 - 145 mmol/L   Potassium 4.3 3.5 - 5.1 mmol/L   Chloride 102 98 - 111 mmol/L   CO2 26 22 - 32 mmol/L   Glucose, Bld 139 (H) 70 - 99 mg/dL    Comment: Glucose reference range applies only to samples taken after  fasting for at least 8 hours.   BUN 16 8 - 23 mg/dL   Creatinine, Ser 1.18 (H) 0.44 - 1.00 mg/dL   Calcium 9.6 8.9 - 10.3 mg/dL   Total Protein 8.2 (H) 6.5 - 8.1 g/dL   Albumin 4.1 3.5 - 5.0 g/dL   AST 25 15 - 41 U/L   ALT 21 0 - 44 U/L   Alkaline Phosphatase 90 38 - 126 U/L   Total Bilirubin 0.5 0.3 - 1.2 mg/dL   GFR calc non Af Amer 49 (L) >60 mL/min   GFR calc Af Amer 56 (L) >60 mL/min   Anion gap 8 5 - 15    Comment: Performed at [facility], [place] [address] [place], [place] [address]  Lipase, blood     Status: None   Collection Time: 01/14/20 11:48 PM  Result Value Ref Range   Lipase 28 11 - 51 U/L    Comment: Performed at [facility], [place] [address], [place], [place] [address]  Urinalysis, Routine w reflex microscopic     Status: Abnormal   Collection Time: 01/15/20  1:00 AM  Result Value Ref Range   Color, Urine YELLOW YELLOW   APPearance HAZY (A) CLEAR   Specific Gravity, Urine 1.039 (H) 1.005 - 1.030   pH 5.0 5.0 - 8.0   Glucose, UA NEGATIVE NEGATIVE mg/dL   Hgb urine dipstick NEGATIVE NEGATIVE   Bilirubin Urine NEGATIVE NEGATIVE   Ketones, ur NEGATIVE NEGATIVE mg/dL   Protein, ur NEGATIVE NEGATIVE mg/dL   Nitrite NEGATIVE NEGATIVE   Leukocytes,Ua NEGATIVE NEGATIVE    Comment: Performed at [facility] [address] [place], [place] [address]    Imaging / Studies: CT ABDOMEN PELVIS W CONTRAST  Result Date: 01/15/2020 CLINICAL DATA:  Abdominal pain and cramping. Concern for bowel obstruction. EXAM: CT ABDOMEN AND PELVIS WITH CONTRAST TECHNIQUE: Multidetector CT imaging of the abdomen and pelvis was performed using the standard protocol following bolus administration of intravenous contrast. CONTRAST:  171m OMNIPAQUE IOHEXOL 300 MG/ML  SOLN COMPARISON:  08/01/2018 FINDINGS: Lower chest: The lung bases are clear. The heart size is normal. Hepatobiliary: The liver is normal. Normal gallbladder.There is no  biliary ductal dilation. Pancreas: Normal contours without ductal dilatation. No peripancreatic fluid collection. Spleen: No splenic laceration or hematoma. Adrenals/Urinary Tract: --Adrenal glands: No adrenal hemorrhage. --Right kidney/ureter: No hydronephrosis or perinephric hematoma. --Left kidney/ureter: No hydronephrosis or perinephric hematoma. --Urinary bladder: Unremarkable. Stomach/Bowel: --Stomach/Duodenum: The stomach is moderately distended. --Small bowel: There is evidence for a small bowel obstruction with multiple dilated loops of small bowel measuring up to approximately 3.2 cm. There appears to be a  transition point in the left lower quadrant (axial series 2, image 57). There is a right lower quadrant end ileostomy in place. --Colon: The patient is status post total colectomy. --Appendix: Surgically absent. Vascular/Lymphatic: Atherosclerotic calcification is present within the non-aneurysmal abdominal aorta, without hemodynamically significant stenosis. --No retroperitoneal lymphadenopathy. --No mesenteric lymphadenopathy. --No pelvic or inguinal lymphadenopathy. Reproductive: Status post hysterectomy. No adnexal mass. Other: There is a small amount of free fluid in the patient's abdomen and pelvis, likely reactive. The abdominal wall is normal. Musculoskeletal. No acute displaced fractures. IMPRESSION: 1. Small-bowel obstruction with a transition point in the left lower quadrant. 2. Status post total colectomy. 3. Right lower quadrant and ileostomy without evidence for a complication. 4. Small amount of free fluid in the patient's abdomen and pelvis, likely reactive. Aortic Atherosclerosis (ICD10-I70.0). Electronically Signed   By: Constance Holster M.D.   On: 01/15/2020 01:21    Medications / Allergies: per chart  Antibiotics: Anti-infectives (From admission, onward)   None        Note: Portions of this report may have been transcribed using voice recognition software. Every effort  was made to ensure accuracy; however, inadvertent computerized transcription errors may be present.   Any transcriptional errors that result from this process are unintentional.    Adin Hector, MD, FACS, MASCRS Gastrointestinal and Minimally Invasive Surgery  [facility] [address], [place], [place] [address] [phone] Main / Paging [phone] Fax     01/15/2020

## 2020-01-15 NOTE — Progress Notes (Signed)
RN called to room, pt reports small amount of stool in colostomy bag. Will update PA    01/15/20 1600  Stool Characteristics  Stool Type Type 6 (Mushy consistency with ragged edges)  Stool Descriptors Brown  Stool Amount Small  Stool Source Colostomy

## 2020-01-15 NOTE — Progress Notes (Signed)
63 year old female with history of ulcerative colitis status post colectomy with ileostomy, hypertension hyperlipidemia.  Previous history of small bowel obstructions presented to the emergency room with sudden onset of abdominal pain and cramping as well no passage of stool into the ileostomy bag.  She was found to have a small bowel obstruction with persistent findings admitted to the hospital.  #1. small bowel obstruction in a patient with previous surgical interventions, end ileostomy: Persistent bowel obstruction. N.p.o., NG tube to suction, adequate IV fluids, adequate pain medications, serial abdominal exams, serial radiographs, surgical follow-up.  They are anticipating conservative management. Check and replace electrolytes.  #2. essential hypertension: Currently unable to take p.o. medications.  Fairly stable.  Will keep patient on as needed antihypertensives through IV line.

## 2020-01-15 NOTE — H&P (Signed)
History and Physical    Latasha Wood JOA:[id number] DOB: [birth year] DOA: 01/14/2020  PCP: Janith Lima, MD  Patient coming from: Home  I have personally briefly reviewed patient's old medical records in [place]  Chief Complaint: Abd pain  HPI: Latasha Wood is a 64 y.o. female with medical history significant of [place] (s/p colectomy with ileostomy), HTN, HLD.  Pt presents to the ED today with c/o abd pain / cramping.  Onset around 2pm.  Constant, waxing and waning.  Persistent vomiting PTA.  No passage of stool into ileostomy bag since pain started.  Pt worried about SBO.  Sounds like h/o SBO previously.  Has had NGT previously.   ED Course: Pt with SBO on CT abd/pelvis.  Gen surg consulted, pt put on SBO protocol.   Review of Systems: As per HPI, otherwise all review of systems negative.  Past Medical History:  Diagnosis Date  . Anemia   . Anxiety   . Blood transfusion   . Colitis, ulcerative ([place])   . Colon polyps   . Hyperlipidemia   . Hypertension   . Ileostomy present ([place])   . Numbness and tingling    hands and feet bilat   . Shortness of breath dyspnea    talking or walking   . Type 2 diabetes mellitus with complication, without long-term current use of insulin ([place]) 09/05/2015   currently on no medications, (05/26/2016) pt denies diabetes.  States that she had one time high blood sugars d/t prednisone    Past Surgical History:  Procedure Laterality Date  . ABDOMINAL HYSTERECTOMY  2000  . ABDOMINAL SURGERY    . BREAST CYST ASPIRATION    . CATARACT EXTRACTION W/PHACO Right 10/20/2017   Procedure: CATARACT EXTRACTION PHACO AND INTRAOCULAR LENS PLACEMENT (IOC);  Surgeon: [name], MD;  Location: [facility] ORS;  Service: Ophthalmology;  Laterality: Right;  [place] 00:50.1 AP% 13.2 CDE 6.61 Fluid Pack lot # [id number]  . COLON SURGERY     colostomy  . cortisol shot     in left shoulder  . DILATION AND CURETTAGE OF UTERUS     most likely  after miscarriage  . fibrocystic breast disease     q 6 month mammogram  . gravida 6 para 2     all SAB  . hyadiform mole    . ILEO LOOP DIVERSION N/A 09/25/2015   Procedure: ILEO LOOP COLOSTOMY;  Surgeon: Leighton Ruff, MD;  Location: [facility];  Service: General;  Laterality: N/A;  . ILEOSTOMY CLOSURE N/A 10/08/2015   Procedure: ILEOSTOMY REVISION;  Surgeon: Leighton Ruff, MD;  Location: [facility];  Service: General;  Laterality: N/A;  . ILEOSTOMY CLOSURE N/A 08/20/2016   Procedure: OPEN RELOCATION OF ILEOSTOMY;  Surgeon: Leighton Ruff, MD;  Location: [facility];  Service: General;  Laterality: N/A;  . IR GENERIC HISTORICAL  09/01/2016   IR US GUIDE VASC ACCESS RIGHT 09/01/2016 Ascencion Dike, PA-C [facility]  . IR GENERIC HISTORICAL  09/01/2016   IR FLUORO GUIDE CV LINE RIGHT 09/01/2016 Ascencion Dike, PA-C [facility]  . KNEE ARTHROSCOPY W/ MENISCAL REPAIR     right knee ([place])  . LAPAROSCOPIC SMALL BOWEL RESECTION N/A 09/25/2015   Procedure: LAPAROSCOPIC BOWEL RESECTION TIMES TWO;  Surgeon: Leighton Ruff, MD;  Location: [facility];  Service: General;  Laterality: N/A;  . ROBOTIC ASSISTED LAPAROSCOPIC LYSIS OF ADHESION N/A 09/25/2015   Procedure: XI ROBOTIC ASSISTED LAPAROSCOPIC LYSIS OF ADHESION;  Surgeon: Leighton Ruff, MD;  Location: [facility];  Service: General;  Laterality: N/A;  90 minutes  . Small bowel obstruction       reports that she has never smoked. She has never used smokeless tobacco. She reports current alcohol use of about 2.0 standard drinks of alcohol per week. She reports that she does not use drugs.  Allergies  Allergen Reactions  . Betadine [Povidone Iodine]   . Latex Rash    Redness of skin  . Tape Rash    Redness of skin OK with hypofix tape and paper tape! NO transpore tape ,adhesive tape!    Family History  Problem Relation Age of Onset  . Diabetes Mother   . Breast cancer Mother 28  . Diabetes Father   . Heart disease Father   . Diabetes Sister   .  Diabetes Brother   . Breast cancer Sister 57  . Colon cancer Paternal Aunt   . Breast cancer Maternal Aunt      Prior to Admission medications   Medication Sig Start Date End Date Taking? Authorizing Provider  cholecalciferol (VITAMIN D3) 25 MCG (1000 UNIT) tablet Take 1,000 Units by mouth daily.   Yes [provider]  loperamide (IMODIUM) 2 MG capsule Take 1 capsule (2 mg total) by mouth 3 (three) times daily as needed for diarrhea or loose stools. 36/46/80  Yes Leighton Ruff, MD  Multiple Vitamin (MULTIVITAMIN WITH MINERALS) TABS tablet Take 1 tablet by mouth daily.   Yes [provider]  olmesartan-hydrochlorothiazide (BENICAR HCT) 40-12.5 MG tablet HOLD FOR NOW 01/03/20   Elsie Stain, MD    Physical Exam: Vitals:   01/14/20 2132 01/14/20 2356  BP: (!) 142/87 (!) 152/96  Pulse: 79 66  Resp: 17 15  Temp: 98 F (36.7 C)   TempSrc: Oral   SpO2: 99% 94%    Constitutional: NAD, calm, comfortable Eyes: PERRL, lids and conjunctivae normal ENMT: Mucous membranes are moist. Posterior pharynx clear of any exudate or lesions.Normal dentition.  Neck: normal, supple, no masses, no thyromegaly Respiratory: clear to auscultation bilaterally, no wheezing, no crackles. Normal respiratory effort. No accessory muscle use.  Cardiovascular: Regular rate and rhythm, no murmurs / rubs / gallops. No extremity edema. 2+ pedal pulses. No carotid bruits.  Abdomen: no tenderness, no masses palpated. No hepatosplenomegaly. Bowel sounds positive.  Musculoskeletal: no clubbing / cyanosis. No joint deformity upper and lower extremities. Good ROM, no contractures. Normal muscle tone.  Skin: no rashes, lesions, ulcers. No induration Neurologic: CN 2-12 grossly intact. Sensation intact, DTR normal. Strength 5/5 in all 4.  Psychiatric: Normal judgment and insight. Alert and oriented x 3. Normal mood.    Labs on Admission: I have personally reviewed following labs and imaging  studies  CBC: Recent Labs  Lab 01/14/20 2348  WBC 12.8*  NEUTROABS 11.1*  HGB 13.5  HCT 41.2  MCV 87.8  PLT 321   Basic Metabolic Panel: Recent Labs  Lab 01/14/20 2348  NA 136  K 4.3  CL 102  CO2 26  GLUCOSE 139*  BUN 16  CREATININE 1.18*  CALCIUM 9.6   GFR: Estimated Creatinine Clearance: 50.9 mL/min (A) (by C-G formula based on SCr of 1.18 mg/dL (H)). Liver Function Tests: Recent Labs  Lab 01/14/20 2348  AST 25  ALT 21  ALKPHOS 90  BILITOT 0.5  PROT 8.2*  ALBUMIN 4.1   Recent Labs  Lab 01/14/20 2348  LIPASE 28   No results for input(s): AMMONIA in the last 168 hours. Coagulation Profile: No results  for input(s): INR, PROTIME in the last 168 hours. Cardiac Enzymes: No results for input(s): CKTOTAL, CKMB, CKMBINDEX, TROPONINI in the last 168 hours. BNP (last 3 results) No results for input(s): PROBNP in the last 8760 hours. HbA1C: No results for input(s): HGBA1C in the last 72 hours. CBG: No results for input(s): GLUCAP in the last 168 hours. Lipid Profile: No results for input(s): CHOL, HDL, LDLCALC, TRIG, CHOLHDL, LDLDIRECT in the last 72 hours. Thyroid Function Tests: No results for input(s): TSH, T4TOTAL, FREET4, T3FREE, THYROIDAB in the last 72 hours. Anemia Panel: No results for input(s): VITAMINB12, FOLATE, FERRITIN, TIBC, IRON, RETICCTPCT in the last 72 hours. Urine analysis:    Component Value Date/Time   COLORURINE YELLOW 01/15/2020 0100   APPEARANCEUR HAZY (A) 01/15/2020 0100   LABSPEC 1.039 (H) 01/15/2020 0100   PHURINE 5.0 01/15/2020 0100   GLUCOSEU NEGATIVE 01/15/2020 0100   GLUCOSEU NEGATIVE 09/14/2019 0944   HGBUR NEGATIVE 01/15/2020 0100   BILIRUBINUR NEGATIVE 01/15/2020 0100   KETONESUR NEGATIVE 01/15/2020 0100   PROTEINUR NEGATIVE 01/15/2020 0100   UROBILINOGEN 0.2 09/14/2019 0944   NITRITE NEGATIVE 01/15/2020 0100   LEUKOCYTESUR NEGATIVE 01/15/2020 0100    Radiological Exams on Admission: CT ABDOMEN PELVIS W  CONTRAST  Result Date: 01/15/2020 CLINICAL DATA:  Abdominal pain and cramping. Concern for bowel obstruction. EXAM: CT ABDOMEN AND PELVIS WITH CONTRAST TECHNIQUE: Multidetector CT imaging of the abdomen and pelvis was performed using the standard protocol following bolus administration of intravenous contrast. CONTRAST:  176m OMNIPAQUE IOHEXOL 300 MG/ML  SOLN COMPARISON:  08/01/2018 FINDINGS: Lower chest: The lung bases are clear. The heart size is normal. Hepatobiliary: The liver is normal. Normal gallbladder.There is no biliary ductal dilation. Pancreas: Normal contours without ductal dilatation. No peripancreatic fluid collection. Spleen: No splenic laceration or hematoma. Adrenals/Urinary Tract: --Adrenal glands: No adrenal hemorrhage. --Right kidney/ureter: No hydronephrosis or perinephric hematoma. --Left kidney/ureter: No hydronephrosis or perinephric hematoma. --Urinary bladder: Unremarkable. Stomach/Bowel: --Stomach/Duodenum: The stomach is moderately distended. --Small bowel: There is evidence for a small bowel obstruction with multiple dilated loops of small bowel measuring up to approximately 3.2 cm. There appears to be a transition point in the left lower quadrant (axial series 2, image 57). There is a right lower quadrant end ileostomy in place. --Colon: The patient is status post total colectomy. --Appendix: Surgically absent. Vascular/Lymphatic: Atherosclerotic calcification is present within the non-aneurysmal abdominal aorta, without hemodynamically significant stenosis. --No retroperitoneal lymphadenopathy. --No mesenteric lymphadenopathy. --No pelvic or inguinal lymphadenopathy. Reproductive: Status post hysterectomy. No adnexal mass. Other: There is a small amount of free fluid in the patient's abdomen and pelvis, likely reactive. The abdominal wall is normal. Musculoskeletal. No acute displaced fractures. IMPRESSION: 1. Small-bowel obstruction with a transition point in the left lower  quadrant. 2. Status post total colectomy. 3. Right lower quadrant and ileostomy without evidence for a complication. 4. Small amount of free fluid in the patient's abdomen and pelvis, likely reactive. Aortic Atherosclerosis (ICD10-I70.0). Electronically Signed   By: CConstance HolsterM.D.   On: 01/15/2020 01:21    EKG: Independently reviewed.  Assessment/Plan Principal Problem:   SBO (small bowel obstruction) (HCC) Active Problems:   Hyperlipidemia LDL goal <70   Anxiety   Prediabetes   Ulcerative colitis (HCC)   Otalgia of both ears   [place]    1. SBO - 1. Gen surg consulted 2. Pt on SBO protocol 3. NGT being placed now 4. NPO 5. IVF: 2L bolus in ED, start 100 cc/hr NS  at noon 6. Strict intake and output. 2. UC - 1. S/p colectomy and ileostomy 2. Wound care consult for ostomy care 3. HTN - 1. Holding home PO Benicar 2. Actually looks like benicar already on hold anyhow by Dr. Joya Gaskins 3. Use short acting IV if needed 4. Prediabetes - 1. Not on any chronic DM meds 2. CBG today is 139 in ED  DVT prophylaxis: Lovenox Code Status: Full Family Communication: No family in room Disposition Plan: Home after admit Consults called: Gen surg Admission status: Admit to inpatient  Severity of Illness: The appropriate patient status for this patient is INPATIENT. Inpatient status is judged to be reasonable and necessary in order to provide the required intensity of service to ensure the patient's safety. The patient's presenting symptoms, physical exam findings, and initial radiographic and laboratory data in the context of their chronic comorbidities is felt to place them at high risk for further clinical deterioration. Furthermore, it is not anticipated that the patient will be medically stable for discharge from the hospital within 2 midnights of admission. The following factors support the patient status of inpatient.   IP status due to SBO requiring NGT.   * I  certify that at the point of admission it is my clinical judgment that the patient will require inpatient hospital care spanning beyond 2 midnights from the point of admission due to high intensity of service, high risk for further deterioration and high frequency of surveillance required.*    Criag Wicklund M. DO [facility]  How to contact the [facility] Attending or Consulting provider [place] or covering provider during after hours [place], for this patient?  1. Check the care team in [facility] and look for a) attending/consulting [facility] provider listed and b) the [facility] team listed 2. Log into [email or web address]  Amion Physician Scheduling and messaging for groups and whole hospitals  On call and physician scheduling software for group practices, residents, hospitalists and other medical providers for call, clinic, rotation and shift schedules. OnCall [employer] is a hospital-wide system for scheduling doctors and paging doctors on call. EasyPlot is for scientific plotting and data analysis.  [email or web address]  and use 's universal password to access. If you do not have the password, please contact the hospital operator.  3. Locate the [facility] provider you are looking for under [facility] and page to a number that you can be directly reached. 4. If you still have difficulty reaching the provider, please page the [facility] (Director on Call) for the Hospitalists listed on amion for assistance.  01/15/2020, 2:39 AM

## 2020-01-15 NOTE — Progress Notes (Signed)
Patient ID: Latasha Wood, female   DOB: [birth year], 63 y.o.   MRN: [id number]       Subjective: Still with crampy pain.  No flatus out of her ileostomy.  Some nausea.  NGT not working currently.  ROS: See above, otherwise other systems negative  Objective: Vital signs in last 24 hours: Temp:  [97.8 F (36.6 C)-98 F (36.7 C)] 97.8 F (36.6 C) (03/08 0555) Pulse Rate:  [62-79] 71 (03/08 0555) Resp:  [14-18] 17 (03/08 0555) BP: (126-155)/(78-96) 143/78 (03/08 0555) SpO2:  [94 %-99 %] 99 % (03/08 0555) Weight:  [83.9 kg] 83.9 kg (03/08 0418) Last BM Date: 01/14/20  Intake/Output from previous day: 03/07 0701 - 03/08 0700 In: 2000 [IV Piggyback:2000] Out: 200 [Emesis/NG output:200] Intake/Output this shift: No intake/output data recorded.  PE: Gen: doesn't feel well Heart: regular Lungs: CTAB Abd: soft, still tender throughout but no peritonitis, no output in ileostomy, multiple scars noted on her abdomen.  NGT flushed and began to immediately evacuate several hundred CCs of tan output. MSK: normal ROM, no cyanosis or edema Skin: warm and dry Psych: A&ox3, appropriately affect  Lab Results:  Recent Labs    01/14/20 2348 01/15/20 0451  WBC 12.8* 9.7  HGB 13.5 12.3  HCT 41.2 37.1  PLT 385 320   BMET Recent Labs    01/14/20 2348 01/15/20 0451  NA 136 138  K 4.3 4.4  CL 102 105  CO2 26 24  GLUCOSE 139* 175*  BUN 16 14  CREATININE 1.18* 1.10*  CALCIUM 9.6 8.7*   PT/INR No results for input(s): LABPROT, INR in the last 72 hours. CMP     Component Value Date/Time   NA 138 01/15/2020 0451   NA 139 07/31/2015 1001   K 4.4 01/15/2020 0451   CL 105 01/15/2020 0451   CO2 24 01/15/2020 0451   GLUCOSE 175 (H) 01/15/2020 0451   BUN 14 01/15/2020 0451   BUN 20 07/31/2015 1001   CREATININE 1.10 (H) 01/15/2020 0451   CALCIUM 8.7 (L) 01/15/2020 0451   PROT 8.2 (H) 01/14/2020 2348   PROT 6.7 07/31/2015 1001   ALBUMIN 4.1 01/14/2020 2348   ALBUMIN 3.9  07/31/2015 1001   AST 25 01/14/2020 2348   ALT 21 01/14/2020 2348   ALKPHOS 90 01/14/2020 2348   BILITOT 0.5 01/14/2020 2348   BILITOT 0.4 07/31/2015 1001   GFRNONAA 53 (L) 01/15/2020 0451   GFRAA >60 01/15/2020 0451   Lipase     Component Value Date/Time   LIPASE 28 01/14/2020 2348       Studies/Results: CT ABDOMEN PELVIS W CONTRAST  Result Date: 01/15/2020 CLINICAL DATA:  Abdominal pain and cramping. Concern for bowel obstruction. EXAM: CT ABDOMEN AND PELVIS WITH CONTRAST TECHNIQUE: Multidetector CT imaging of the abdomen and pelvis was performed using the standard protocol following bolus administration of intravenous contrast. CONTRAST:  166m OMNIPAQUE IOHEXOL 300 MG/ML  SOLN COMPARISON:  08/01/2018 FINDINGS: Lower chest: The lung bases are clear. The heart size is normal. Hepatobiliary: The liver is normal. Normal gallbladder.There is no biliary ductal dilation. Pancreas: Normal contours without ductal dilatation. No peripancreatic fluid collection. Spleen: No splenic laceration or hematoma. Adrenals/Urinary Tract: --Adrenal glands: No adrenal hemorrhage. --Right kidney/ureter: No hydronephrosis or perinephric hematoma. --Left kidney/ureter: No hydronephrosis or perinephric hematoma. --Urinary bladder: Unremarkable. Stomach/Bowel: --Stomach/Duodenum: The stomach is moderately distended. --Small bowel: There is evidence for a small bowel obstruction with multiple dilated loops of small bowel measuring up to approximately 3.2 cm. There  appears to be a transition point in the left lower quadrant (axial series 2, image 57). There is a right lower quadrant end ileostomy in place. --Colon: The patient is status post total colectomy. --Appendix: Surgically absent. Vascular/Lymphatic: Atherosclerotic calcification is present within the non-aneurysmal abdominal aorta, without hemodynamically significant stenosis. --No retroperitoneal lymphadenopathy. --No mesenteric lymphadenopathy. --No pelvic or  inguinal lymphadenopathy. Reproductive: Status post hysterectomy. No adnexal mass. Other: There is a small amount of free fluid in the patient's abdomen and pelvis, likely reactive. The abdominal wall is normal. Musculoskeletal. No acute displaced fractures. IMPRESSION: 1. Small-bowel obstruction with a transition point in the left lower quadrant. 2. Status post total colectomy. 3. Right lower quadrant and ileostomy without evidence for a complication. 4. Small amount of free fluid in the patient's abdomen and pelvis, likely reactive. Aortic Atherosclerosis (ICD10-I70.0). Electronically Signed   By: Constance Holster M.D.   On: 01/15/2020 01:21   DG Abd Portable 1V-Small Bowel Protocol-Position Verification  Result Date: 01/15/2020 CLINICAL DATA:  64 year old female NG tube placement. Small bowel obstruction. EXAM: PORTABLE ABDOMEN - 1 VIEW COMPARISON:  CT Abdomen and Pelvis earlier today. FINDINGS: Portable AP supine view at 0304 hours. Enteric tube placed with side hole at the level of the gastric fundus. Excreted IV contrast in the renal collecting systems. Negative lung bases. Paucity of bowel gas. Lower lumbar spine degeneration. IMPRESSION: Enteric tube placed into the stomach with side hole at the level of the gastric fundus. Electronically Signed   By: Genevie Ann M.D.   On: 01/15/2020 03:14    Anti-infectives: Anti-infectives (From admission, onward)   None       Assessment/Plan  DM H/O UC with total colectomy and ileostomy HTN HLD Anemia  SBO -cont NGT and will need to be flushed to keep the tube working. -no evidence of resolution of SBO -await 8 hr delay films, but given no output in ileostomy do not see evidence of improvement -would wait multiple days prior to surgery, as long as we are able, to avoid surgery if possible given prior surgery.  She understands and agrees with this plan as she states she was told she has a "hostile" abdomen. -may mobilize with NGT clamped.   FEN -  NPO/NGT/IVFs VTE - Lovenox ID - none   LOS: 0 days    Henreitta Cea , [facility] 01/15/2020, 9:47 AM Please see Amion for pager number during day hours 7:00am-4:30pm or 7:00am -11:30am on weekends

## 2020-01-15 NOTE — ED Provider Notes (Signed)
[facility] DEPT Provider Note   CSN: [id number] Arrival date & time: 01/14/20  2117    History Chief Complaint  Patient presents with  . Abdominal Pain    Latasha Wood is a 64 y.o. female.   64 y/o female with hx of ulcerative colitis (s/p colectomy with ileostomy), HTN, HLD, DM sent to the emergency department for abdominal cramping.  She states that cramping has been fairly constant and waxing and waning in severity since onset around 1400.  She has had persistent vomiting prior to arrival and has noted no passage of stool into her ileostomy bag since her pain started.  Expresses concern for bowel obstruction as she has had similar pain in the past with this (last in 2017).  No medications taken prior to arrival.  No fevers, urinary symptoms.  Hx of multiple prior abdominal surgeries; hysterectomy, laparoscopic lysis of adhesions, small bowel resection, colectomy, ileo loop diversion, ileostomy closure and relocation.    Has been followed by Dr. Marcello Moores at [facility]  PCP - MD Ronnald Ramp   Abdominal Pain      Past Medical History:  Diagnosis Date  . Anemia   . Anxiety   . Blood transfusion   . Colitis, ulcerative ([place])   . Colon polyps   . Hyperlipidemia   . Hypertension   . Ileostomy present ([place])   . Numbness and tingling    hands and feet bilat   . Shortness of breath dyspnea    talking or walking   . Type 2 diabetes mellitus with complication, without long-term current use of insulin ([place]) 09/05/2015   currently on no medications, (05/26/2016) pt denies diabetes.  States that she had one time high blood sugars d/t prednisone    Patient Active Problem List   Diagnosis Date Noted  . Ileostomy in place [facility]) 01/15/2020  . Tingling of both feet 09/14/2019  . Need for shingles vaccine 09/14/2019  . Need for influenza vaccination 09/14/2019  . Encounter for screening for HIV 09/14/2019  . Allergic contact dermatitis due to  adhesives 02/09/2018  . Insomnia secondary to chronic pain 02/09/2018  . GERD without esophagitis 09/08/2017  . Otalgia of both ears 10/06/2016  . Ileostomy dysfunction ([place]) 08/20/2016  . Right thyroid nodule 08/17/2016  . Arthropathy in ulcerative colitis ([place]) 03/23/2016  . Visit for screening mammogram 03/23/2016  . Ulcerative colitis ([place]) 09/25/2015  . Prediabetes 09/05/2015  . Pure hyperglyceridemia 09/01/2015  . Anxiety 09/01/2015  . Routine general medical examination at a health care facility 05/28/2015  . SBO (small bowel obstruction) ([place]) 05/17/2015  . Hyperlipidemia LDL goal <70 04/17/2011  . Essential hypertension 12/30/2007    Past Surgical History:  Procedure Laterality Date  . ABDOMINAL HYSTERECTOMY  2000  . ABDOMINAL SURGERY    . BREAST CYST ASPIRATION    . CATARACT EXTRACTION W/PHACO Right 10/20/2017   Procedure: CATARACT EXTRACTION PHACO AND INTRAOCULAR LENS PLACEMENT (IOC);  Surgeon: [name], MD;  Location: [facility] ORS;  Service: Ophthalmology;  Laterality: Right;  [place] 00:50.1 AP% 13.2 CDE 6.61 Fluid Pack lot # [id number]  . COLON SURGERY     colostomy  . cortisol shot     in left shoulder  . DILATION AND CURETTAGE OF UTERUS     most likely after miscarriage  . fibrocystic breast disease     q 6 month mammogram  . gravida 6 para 2     all SAB  . hyadiform mole    .  ILEO LOOP DIVERSION N/A 09/25/2015   Procedure: ILEO LOOP COLOSTOMY;  Surgeon: Leighton Ruff, MD;  Location: [facility] ORS;  Service: General;  Laterality: N/A;  . ILEOSTOMY CLOSURE N/A 10/08/2015   Procedure: ILEOSTOMY REVISION;  Surgeon: Leighton Ruff, MD;  Location: [facility] ORS;  Service: General;  Laterality: N/A;  . ILEOSTOMY CLOSURE N/A 08/20/2016   Procedure: OPEN RELOCATION OF ILEOSTOMY;  Surgeon: Leighton Ruff, MD;  Location: [facility] ORS;  Service: General;  Laterality: N/A;  . IR GENERIC HISTORICAL  09/01/2016   IR US GUIDE VASC ACCESS RIGHT 09/01/2016 Ascencion Dike, PA-C [facility]  .  IR GENERIC HISTORICAL  09/01/2016   IR FLUORO GUIDE CV LINE RIGHT 09/01/2016 Ascencion Dike, PA-C [facility]  . KNEE ARTHROSCOPY W/ MENISCAL REPAIR     right knee ([place])  . LAPAROSCOPIC SMALL BOWEL RESECTION N/A 09/25/2015   Procedure: LAPAROSCOPIC BOWEL RESECTION TIMES TWO;  Surgeon: Leighton Ruff, MD;  Location: [facility];  Service: General;  Laterality: N/A;  . ROBOTIC ASSISTED LAPAROSCOPIC LYSIS OF ADHESION N/A 09/25/2015   Procedure: XI ROBOTIC ASSISTED LAPAROSCOPIC LYSIS OF ADHESION;  Surgeon: Leighton Ruff, MD;  Location: [facility] ORS;  Service: General;  Laterality: N/A;  90 minutes  . Small bowel obstruction       OB History   No obstetric history on file.     Family History  Problem Relation Age of Onset  . Diabetes Mother   . Breast cancer Mother 9  . Diabetes Father   . Heart disease Father   . Diabetes Sister   . Diabetes Brother   . Breast cancer Sister 32  . Colon cancer Paternal Aunt   . Breast cancer Maternal Aunt     Social History   Tobacco Use  . Smoking status: Never Smoker  . Smokeless tobacco: Never Used  Substance Use Topics  . Alcohol use: Yes    Alcohol/week: 2.0 standard drinks    Types: 2 Standard drinks or equivalent per week    Comment: occ  . Drug use: No    Home Medications Prior to Admission medications   Medication Sig Start Date End Date Taking? Authorizing Provider  cholecalciferol (VITAMIN D3) 25 MCG (1000 UNIT) tablet Take 1,000 Units by mouth daily.   Yes [provider]  loperamide (IMODIUM) 2 MG capsule Take 1 capsule (2 mg total) by mouth 3 (three) times daily as needed for diarrhea or loose stools. 78/67/67  Yes Leighton Ruff, MD  Multiple Vitamin (MULTIVITAMIN WITH MINERALS) TABS tablet Take 1 tablet by mouth daily.   Yes [provider]  olmesartan-hydrochlorothiazide (BENICAR HCT) 40-12.5 MG tablet HOLD FOR NOW 01/03/20   Elsie Stain, MD    Allergies    Betadine [povidone iodine], Latex, and Tape   Review of Systems   Review of Systems  Gastrointestinal: Positive for abdominal pain.  Ten systems reviewed and are negative for acute change, except as noted in the HPI.    Physical Exam Updated Vital Signs BP (!) 152/96   Pulse 66   Temp 98 F (36.7 C) (Oral)   Resp 15   SpO2 94%   Physical Exam Vitals and nursing note reviewed.  Constitutional:      General: She is not in acute distress.    Appearance: She is well-developed. She is not diaphoretic.     Comments: Appears uncomfortable; nontoxic.  HENT:     Head: Normocephalic and atraumatic.  Eyes:     General: No scleral icterus.    Conjunctiva/sclera: Conjunctivae  normal.  Cardiovascular:     Rate and Rhythm: Normal rate and regular rhythm.     Pulses: Normal pulses.  Pulmonary:     Effort: Pulmonary effort is normal. No respiratory distress.     Comments: Respirations even and unlabored Abdominal:       Comments: TTP in the LUQ and LLQ. Abdomen obese, mildly distended. No peritoneal signs.  Musculoskeletal:        General: Normal range of motion.     Cervical back: Normal range of motion.  Skin:    General: Skin is warm and dry.     Coloration: Skin is not pale.     Findings: No erythema or rash.  Neurological:     Mental Status: She is alert and oriented to person, place, and time.     Coordination: Coordination normal.  Psychiatric:        Behavior: Behavior normal.     ED Results / Procedures / Treatments   Labs (all labs ordered are listed, but only abnormal results are displayed) Labs Reviewed  CBC WITH DIFFERENTIAL/PLATELET - Abnormal; Notable for the following components:      Result Value   WBC 12.8 (*)    Neutro Abs 11.1 (*)    All other components within normal limits  COMPREHENSIVE METABOLIC PANEL - Abnormal; Notable for the following components:   Glucose, Bld 139 (*)    Creatinine, Ser 1.18 (*)    Total Protein 8.2 (*)    GFR calc non Af Amer 49 (*)    GFR calc Af Amer 56 (*)    All  other components within normal limits  URINALYSIS, ROUTINE W REFLEX MICROSCOPIC - Abnormal; Notable for the following components:   APPearance HAZY (*)    Specific Gravity, Urine 1.039 (*)    All other components within normal limits  SARS CORONAVIRUS 2 (TAT 6-24 HRS)  LIPASE, BLOOD  LACTIC ACID, PLASMA  CBC  BASIC METABOLIC PANEL    EKG None  Radiology CT ABDOMEN PELVIS W CONTRAST  Result Date: 01/15/2020 CLINICAL DATA:  Abdominal pain and cramping. Concern for bowel obstruction. EXAM: CT ABDOMEN AND PELVIS WITH CONTRAST TECHNIQUE: Multidetector CT imaging of the abdomen and pelvis was performed using the standard protocol following bolus administration of intravenous contrast. CONTRAST:  120m OMNIPAQUE IOHEXOL 300 MG/ML  SOLN COMPARISON:  08/01/2018 FINDINGS: Lower chest: The lung bases are clear. The heart size is normal. Hepatobiliary: The liver is normal. Normal gallbladder.There is no biliary ductal dilation. Pancreas: Normal contours without ductal dilatation. No peripancreatic fluid collection. Spleen: No splenic laceration or hematoma. Adrenals/Urinary Tract: --Adrenal glands: No adrenal hemorrhage. --Right kidney/ureter: No hydronephrosis or perinephric hematoma. --Left kidney/ureter: No hydronephrosis or perinephric hematoma. --Urinary bladder: Unremarkable. Stomach/Bowel: --Stomach/Duodenum: The stomach is moderately distended. --Small bowel: There is evidence for a small bowel obstruction with multiple dilated loops of small bowel measuring up to approximately 3.2 cm. There appears to be a transition point in the left lower quadrant (axial series 2, image 57). There is a right lower quadrant end ileostomy in place. --Colon: The patient is status post total colectomy. --Appendix: Surgically absent. Vascular/Lymphatic: Atherosclerotic calcification is present within the non-aneurysmal abdominal aorta, without hemodynamically significant stenosis. --No retroperitoneal lymphadenopathy.  --No mesenteric lymphadenopathy. --No pelvic or inguinal lymphadenopathy. Reproductive: Status post hysterectomy. No adnexal mass. Other: There is a small amount of free fluid in the patient's abdomen and pelvis, likely reactive. The abdominal wall is normal. Musculoskeletal. No acute displaced fractures. IMPRESSION: 1. Small-bowel obstruction  with a transition point in the left lower quadrant. 2. Status post total colectomy. 3. Right lower quadrant and ileostomy without evidence for a complication. 4. Small amount of free fluid in the patient's abdomen and pelvis, likely reactive. Aortic Atherosclerosis (ICD10-I70.0). Electronically Signed   By: Constance Holster M.D.   On: 01/15/2020 01:21    Procedures Procedures (including critical care time)  Medications Ordered in ED Medications  sodium chloride (PF) 0.9 % injection (has no administration in time range)  diatrizoate meglumine-sodium (GASTROGRAFIN) 66-10 % solution 90 mL (has no administration in time range)  lactated ringers bolus 1,000 mL (has no administration in time range)  lactated ringers bolus 1,000 mL (has no administration in time range)  ondansetron (ZOFRAN) injection 4 mg (has no administration in time range)    Or  ondansetron (ZOFRAN) 8 mg in sodium chloride 0.9 % 50 mL IVPB (has no administration in time range)  prochlorperazine (COMPAZINE) injection 5-10 mg (has no administration in time range)  lip balm (CARMEX) ointment 1 application (has no administration in time range)  phenol (CHLORASEPTIC) mouth spray 2 spray (has no administration in time range)  menthol-cetylpyridinium (CEPACOL) lozenge 3 mg (has no administration in time range)  magic mouthwash (has no administration in time range)  HYDROmorphone (DILAUDID) injection 1 mg (has no administration in time range)  acetaminophen (TYLENOL) tablet 650 mg (has no administration in time range)    Or  acetaminophen (TYLENOL) suppository 650 mg (has no administration in  time range)  enoxaparin (LOVENOX) injection 40 mg (has no administration in time range)  0.9 %  sodium chloride infusion (has no administration in time range)  HYDROmorphone (DILAUDID) injection 1 mg (1 mg Intravenous Given 01/14/20 2350)  ondansetron (ZOFRAN) injection 4 mg (4 mg Intravenous Given 01/14/20 2350)  sodium chloride 0.9 % bolus 1,000 mL (1,000 mLs Intravenous New Bag/Given 01/14/20 2353)  iohexol (OMNIPAQUE) 300 MG/ML solution 100 mL (100 mLs Intravenous Contrast Given 01/15/20 0101)    ED Course  I have reviewed the triage vital signs and the nursing notes.  Pertinent labs & imaging results that were available during my care of the patient were reviewed by me and considered in my medical decision making (see chart for details).  Clinical Course as of Jan 15 243  Mon Jan 15, 2020  0130 CT scan today shows small bowel obstruction with transition point in the left lower quadrant.  Patient was last admitted by surgical service SBO management in 2017.  Will consult for input.  NG tube ordered for placement.   [XN]  1700 Patient states her pain is presently well controlled. Declines additional medications. Aware of need for NGT and admission; agreeable.   [KH]  0220 Case discussed between MD Betsey Holiday and MD Gross. [facility] to follow. Requesting medicine admission. TRH paged.   [KH]    Clinical Course User Index [KH] Antonietta Breach, PA-C    MDM Rules/Calculators/A&P                      64 year old female to be admitted for management of small bowel obstruction.  History of multiple prior abdominal surgeries.  Has existing ileostomy.  Dr. Alcario Drought of [facility] to admit with general surgery consulting.   Final Clinical Impression(s) / ED Diagnoses Final diagnoses:  Small bowel obstruction ([place])  Encounter for imaging study to confirm nasogastric (NG) tube placement    Rx / DC Orders ED Discharge Orders    None  Antonietta Breach, PA-C 01/15/20 0245    Orpah Greek, MD  01/15/20 [phone]

## 2020-01-16 ENCOUNTER — Inpatient Hospital Stay (HOSPITAL_COMMUNITY): Payer: Medicare Other

## 2020-01-16 LAB — PHOSPHORUS: Phosphorus: 2.1 mg/dL — ABNORMAL LOW (ref 2.5–4.6)

## 2020-01-16 LAB — BASIC METABOLIC PANEL
Anion gap: 9 (ref 5–15)
BUN: 13 mg/dL (ref 8–23)
CO2: 23 mmol/L (ref 22–32)
Calcium: 8.2 mg/dL — ABNORMAL LOW (ref 8.9–10.3)
Chloride: 109 mmol/L (ref 98–111)
Creatinine, Ser: 0.99 mg/dL (ref 0.44–1.00)
GFR calc Af Amer: >60 mL/min (ref >60)
GFR calc non Af Amer: >60 mL/min (ref >60)
Glucose, Bld: 90 mg/dL (ref 70–99)
Potassium: 3.5 mmol/L (ref 3.5–5.1)
Sodium: 141 mmol/L (ref 135–145)

## 2020-01-16 LAB — CBC WITH DIFFERENTIAL/PLATELET
Abs Immature Granulocytes: 0.24 10*3/uL — ABNORMAL HIGH (ref 0.00–0.07)
Basophils Absolute: 0.1 10*3/uL (ref 0.0–0.1)
Basophils Relative: 1 %
Eosinophils Absolute: 0.1 10*3/uL (ref 0.0–0.5)
Eosinophils Relative: 1 %
HCT: 34.9 % — ABNORMAL LOW (ref 36.0–46.0)
Hemoglobin: 11.5 g/dL — ABNORMAL LOW (ref 12.0–15.0)
Immature Granulocytes: 2 %
Lymphocytes Relative: 26 %
Lymphs Abs: 3 10*3/uL (ref 0.7–4.0)
MCH: 28.9 pg (ref 26.0–34.0)
MCHC: 33 g/dL (ref 30.0–36.0)
MCV: 87.7 fL (ref 80.0–100.0)
Monocytes Absolute: 0.7 10*3/uL (ref 0.1–1.0)
Monocytes Relative: 6 %
Neutro Abs: 7.6 10*3/uL (ref 1.7–7.7)
Neutrophils Relative %: 64 %
Platelets: 310 10*3/uL (ref 150–400)
RBC: 3.98 MIL/uL (ref 3.87–5.11)
RDW: 13.5 % (ref 11.5–15.5)
WBC: 11.8 10*3/uL — ABNORMAL HIGH (ref 4.0–10.5)
nRBC: 0 % (ref 0.0–0.2)

## 2020-01-16 LAB — MAGNESIUM: Magnesium: 1.8 mg/dL (ref 1.7–2.4)

## 2020-01-16 MED ORDER — IRBESARTAN 150 MG PO TABS
300.0000 mg | ORAL_TABLET | Freq: Every day | ORAL | Status: DC
  Administered 2020-01-16 – 2020-01-17 (×2): 300 mg via ORAL
  Filled 2020-01-16 (×2): qty 2

## 2020-01-16 MED ORDER — MAGNESIUM SULFATE 2 GM/50ML IV SOLN
2.0000 g | Freq: Once | INTRAVENOUS | Status: AC
  Administered 2020-01-16: 2 g via INTRAVENOUS
  Filled 2020-01-16: qty 50

## 2020-01-16 MED ORDER — POTASSIUM PHOSPHATES 15 MMOLE/5ML IV SOLN
20.0000 mmol | Freq: Once | INTRAVENOUS | Status: AC
  Administered 2020-01-16: 20 mmol via INTRAVENOUS
  Filled 2020-01-16: qty 6.67

## 2020-01-16 NOTE — Progress Notes (Signed)
PROGRESS NOTE    Latasha Wood  ZDG:[id number] DOB: 02/17/1956 DOA: 01/14/2020 PCP: Janith Lima, MD    Brief Narrative:  64 year old female with history of ulcerative colitis status post colectomy with ileostomy, hypertension hyperlipidemia.  Previous history of small bowel obstructions presented to the emergency room with sudden onset of abdominal pain and cramping as well no passage of stool into the ileostomy bag.  She was found to have a small bowel obstruction with persistent findings admitted to the hospital.   Assessment & Plan:   Principal Problem:   SBO (small bowel obstruction) (HCC) Active Problems:   Hyperlipidemia LDL goal <70   Anxiety   Prediabetes   Ulcerative colitis ([place])   Otalgia of both ears   Ileostomy in place ([place])  #1. small bowel obstruction in a patient with previous surgical interventions, end ileostomy: Some clinical improvement today.  Started having bowel functions. NG tube removal and clears today.  Slow advancement as per surgery. Continue IV fluids, electrolyte monitoring.  Rest of the management as per surgery.  #2. essential hypertension: Resume her home medications today as she was able to take clear liquid diet.  Continue as needed IV antihypertensives.  #3. hypophosphatemia: Replace and monitor levels.  Replace magnesium.  DVT prophylaxis: Lovenox Code Status: Full code Family Communication: Patient Disposition Plan: patient is from home. Anticipated DC to home tomorrow, Barriers to discharge , still with significant symptoms, challenging with diet today.   Consultants:   General surgery  Procedures:   None  Antimicrobials:   None   Subjective: Patient seen and examined in the morning rounds.  She was very happy to have some bowel movement in the ileostomy bag.  Patient measured at about 400 mL.  Less abdominal distention.  Objective: Vitals:   01/15/20 1030 01/15/20 1357 01/15/20 2050 01/16/20 0556  BP: (!)  164/88 (!) 177/94 (!) 165/85 (!) 176/89  Pulse: 77 69 73 79  Resp:  17 16 14   Temp:  97.8 F (36.6 C) 98.3 F (36.8 C) 99.1 F (37.3 C)  TempSrc:  Oral Oral Oral  SpO2: 100% 99% 97% 99%  Weight:      Height:        Intake/Output Summary (Last 24 hours) at 01/16/2020 1043 Last data filed at 01/16/2020 5956 Gross per 24 hour  Intake 1913 ml  Output 2150 ml  Net -237 ml   Filed Weights   01/15/20 0418  Weight: 83.9 kg    Examination:  General exam: Appears calm and comfortable, on room air. Respiratory system: Clear to auscultation. Respiratory effort normal. Cardiovascular system: S1 & S2 heard, RRR. No JVD, murmurs, rubs, gallops or clicks. No pedal edema. Gastrointestinal system: Abdomen is mildly tender, bowel sounds present.  Ileostomy with loose bowel present.   Central nervous system: Alert and oriented. No focal neurological deficits. Extremities: Symmetric 5 x 5 power. Skin: No rashes, lesions or ulcers Psychiatry: Judgement and insight appear normal. Mood & affect appropriate.     Data Reviewed: I have personally reviewed following labs and imaging studies  CBC: Recent Labs  Lab 01/14/20 2348 01/15/20 0451 01/16/20 0523  WBC 12.8* 9.7 11.8*  NEUTROABS 11.1*  --  7.6  HGB 13.5 12.3 11.5*  HCT 41.2 37.1 34.9*  MCV 87.8 87.1 87.7  PLT 385 320 387   Basic Metabolic Panel: Recent Labs  Lab 01/14/20 2348 01/15/20 0451 01/16/20 0523  NA 136 138 141  K 4.3 4.4 3.5  CL 102 105 109  CO2 26 24 23   GLUCOSE 139* 175* 90  BUN 16 14 13   CREATININE 1.18* 1.10* 0.99  CALCIUM 9.6 8.7* 8.2*  MG  --   --  1.8  PHOS  --   --  2.1*   GFR: Estimated Creatinine Clearance: 60.2 mL/min (by C-G formula based on SCr of 0.99 mg/dL). Liver Function Tests: Recent Labs  Lab 01/14/20 2348  AST 25  ALT 21  ALKPHOS 90  BILITOT 0.5  PROT 8.2*  ALBUMIN 4.1   Recent Labs  Lab 01/14/20 2348  LIPASE 28   No results for input(s): AMMONIA in the last 168  hours. Coagulation Profile: No results for input(s): INR, PROTIME in the last 168 hours. Cardiac Enzymes: No results for input(s): CKTOTAL, CKMB, CKMBINDEX, TROPONINI in the last 168 hours. BNP (last 3 results) No results for input(s): PROBNP in the last 8760 hours. HbA1C: No results for input(s): HGBA1C in the last 72 hours. CBG: No results for input(s): GLUCAP in the last 168 hours. Lipid Profile: No results for input(s): CHOL, HDL, LDLCALC, TRIG, CHOLHDL, LDLDIRECT in the last 72 hours. Thyroid Function Tests: No results for input(s): TSH, T4TOTAL, FREET4, T3FREE, THYROIDAB in the last 72 hours. Anemia Panel: No results for input(s): VITAMINB12, FOLATE, FERRITIN, TIBC, IRON, RETICCTPCT in the last 72 hours. Sepsis Labs: Recent Labs  Lab 01/15/20 0451  LATICACIDVEN 1.8    Recent Results (from the past 240 hour(s))  SARS CORONAVIRUS 2 (TAT 6-24 HRS) Nasopharyngeal Nasopharyngeal Swab     Status: None   Collection Time: 01/15/20  2:51 AM   Specimen: Nasopharyngeal Swab  Result Value Ref Range Status   SARS Coronavirus 2 NEGATIVE NEGATIVE Final    Comment: (NOTE) SARS-CoV-2 target nucleic acids are NOT DETECTED. The SARS-CoV-2 RNA is generally detectable in upper and lower respiratory specimens during the acute phase of infection. Negative results do not preclude SARS-CoV-2 infection, do not rule out co-infections with other pathogens, and should not be used as the sole basis for treatment or other patient management decisions. Negative results must be combined with clinical observations, patient history, and epidemiological information. The expected result is Negative. Fact Sheet for Patients: SugarRoll.be Fact Sheet for Healthcare Providers: https://www.woods-mathews.com/ This test is not yet approved or cleared by the [place] FDA and  has been authorized for detection and/or diagnosis of SARS-CoV-2 by FDA under an Emergency  Use Authorization (EUA). This EUA will remain  in effect (meaning this test can be used) for the duration of the COVID-19 declaration under Section 56 4(b)(1) of the Act, 21 U.S.C. section 360bbb-3(b)(1), unless the authorization is terminated or revoked sooner. Performed at [facility], [place] [address], [place], [place] [address]          Radiology Studies: CT ABDOMEN PELVIS W CONTRAST  Result Date: 01/15/2020 CLINICAL DATA:  Abdominal pain and cramping. Concern for bowel obstruction. EXAM: CT ABDOMEN AND PELVIS WITH CONTRAST TECHNIQUE: Multidetector CT imaging of the abdomen and pelvis was performed using the standard protocol following bolus administration of intravenous contrast. CONTRAST:  173m OMNIPAQUE IOHEXOL 300 MG/ML  SOLN COMPARISON:  08/01/2018 FINDINGS: Lower chest: The lung bases are clear. The heart size is normal. Hepatobiliary: The liver is normal. Normal gallbladder.There is no biliary ductal dilation. Pancreas: Normal contours without ductal dilatation. No peripancreatic fluid collection. Spleen: No splenic laceration or hematoma. Adrenals/Urinary Tract: --Adrenal glands: No adrenal hemorrhage. --Right kidney/ureter: No hydronephrosis or perinephric hematoma. --Left kidney/ureter: No hydronephrosis or perinephric hematoma. --Urinary bladder: Unremarkable.  Stomach/Bowel: --Stomach/Duodenum: The stomach is moderately distended. --Small bowel: There is evidence for a small bowel obstruction with multiple dilated loops of small bowel measuring up to approximately 3.2 cm. There appears to be a transition point in the left lower quadrant (axial series 2, image 57). There is a right lower quadrant end ileostomy in place. --Colon: The patient is status post total colectomy. --Appendix: Surgically absent. Vascular/Lymphatic: Atherosclerotic calcification is present within the non-aneurysmal abdominal aorta, without hemodynamically significant stenosis. --No retroperitoneal  lymphadenopathy. --No mesenteric lymphadenopathy. --No pelvic or inguinal lymphadenopathy. Reproductive: Status post hysterectomy. No adnexal mass. Other: There is a small amount of free fluid in the patient's abdomen and pelvis, likely reactive. The abdominal wall is normal. Musculoskeletal. No acute displaced fractures. IMPRESSION: 1. Small-bowel obstruction with a transition point in the left lower quadrant. 2. Status post total colectomy. 3. Right lower quadrant and ileostomy without evidence for a complication. 4. Small amount of free fluid in the patient's abdomen and pelvis, likely reactive. Aortic Atherosclerosis (ICD10-I70.0). Electronically Signed   By: Constance Holster M.D.   On: 01/15/2020 01:21   DG Abd Portable 1V  Result Date: 01/16/2020 CLINICAL DATA:  Follow-up small bowel obstruction EXAM: PORTABLE ABDOMEN - 1 VIEW COMPARISON:  01/15/2020 FINDINGS: The NG tube is in good position with its tip in the antral region of the stomach. Improved bowel gas pattern with less small bowel distension and interval clearing of contrast material. No free air. The lung bases are grossly clear. Right lower quadrant ostomy again noted. IMPRESSION: 1. Improving bowel gas pattern. 2. NG tube in good position. Electronically Signed   By: Marijo Sanes M.D.   On: 01/16/2020 05:27   DG Abd Portable 1V-Small Bowel Obstruction Protocol-initial, 8 hr delay  Result Date: 01/15/2020 CLINICAL DATA:  8 hour delay for a small-bowel protocol. Contrast given at 04:15 today. EXAM: PORTABLE ABDOMEN - 1 VIEW COMPARISON:  Same day, 0304 hours and CT abdomen pelvis 01/15/2020. FINDINGS: Nasogastric tube terminates in the stomach with the side port in the region of the gastroesophageal junction. Oral contrast is seen in the stomach, duodenum and dilated proximal small bowel. Right lower quadrant ileostomy. No definite colonic oral contrast. Contrast is seen in the bladder. IMPRESSION: 1. Small bowel obstruction, as on CT  performed earlier the same day. 2. Nasogastric tube terminates in the stomach with the side port at the gastroesophageal junction. Electronically Signed   By: Lorin Picket M.D.   On: 01/15/2020 12:30   DG Abd Portable 1V-Small Bowel Protocol-Position Verification  Result Date: 01/15/2020 CLINICAL DATA:  64 year old female NG tube placement. Small bowel obstruction. EXAM: PORTABLE ABDOMEN - 1 VIEW COMPARISON:  CT Abdomen and Pelvis earlier today. FINDINGS: Portable AP supine view at 0304 hours. Enteric tube placed with side hole at the level of the gastric fundus. Excreted IV contrast in the renal collecting systems. Negative lung bases. Paucity of bowel gas. Lower lumbar spine degeneration. IMPRESSION: Enteric tube placed into the stomach with side hole at the level of the gastric fundus. Electronically Signed   By: Genevie Ann M.D.   On: 01/15/2020 03:14        Scheduled Meds: . enoxaparin (LOVENOX) injection  40 mg Subcutaneous Daily  . fentaNYL (SUBLIMAZE) injection  12.5 mcg Intravenous Once  . irbesartan  300 mg Oral Daily  . lip balm  1 application Topical BID   Continuous Infusions: . sodium chloride 100 mL/hr at 01/15/20 1000  . lactated ringers    . magnesium  sulfate bolus IVPB    . ondansetron (ZOFRAN) IV    . potassium PHOSPHATE IVPB (in mmol) 20 mmol (01/16/20 0826)     LOS: 1 day    Time spent: 25 minutes    Barb Merino, MD [facility] Pager [phone]

## 2020-01-16 NOTE — Progress Notes (Signed)
Patient ID: Latasha Wood, female   DOB: 1955/12/23, 64 y.o.   MRN: [id number]       Subjective: Feeling much better.  Less abdominal pain and cramping.  Ileostomy starting to put out.  400cc yesterday and 200cc so far today.  No nausea.  Minimal NGT output  ROS: See above, otherwise other systems negative  Objective: Vital signs in last 24 hours: Temp:  [97.8 F (36.6 C)-99.1 F (37.3 C)] 99.1 F (37.3 C) (03/09 0556) Pulse Rate:  [69-79] 79 (03/09 0556) Resp:  [14-17] 14 (03/09 0556) BP: (164-177)/(85-94) 176/89 (03/09 0556) SpO2:  [97 %-100 %] 99 % (03/09 0556) Last BM Date: 01/15/20  Intake/Output from previous day: 03/08 0701 - 03/09 0700 In: 1913 [P.O.:80; I.V.:1833] Out: 2350 [Urine:1200; Emesis/NG output:800; Stool:350] Intake/Output this shift: No intake/output data recorded.  PE: Heart: regular Lungs: CTAB Abd: soft, less tender, ileostomy just emptied so nothing currently in bag, +BS, ND  Lab Results:  Recent Labs    01/15/20 0451 01/16/20 0523  WBC 9.7 11.8*  HGB 12.3 11.5*  HCT 37.1 34.9*  PLT 320 310   BMET Recent Labs    01/15/20 0451 01/16/20 0523  NA 138 141  K 4.4 3.5  CL 105 109  CO2 24 23  GLUCOSE 175* 90  BUN 14 13  CREATININE 1.10* 0.99  CALCIUM 8.7* 8.2*   PT/INR No results for input(s): LABPROT, INR in the last 72 hours. CMP     Component Value Date/Time   NA 141 01/16/2020 0523   NA 139 07/31/2015 1001   K 3.5 01/16/2020 0523   CL 109 01/16/2020 0523   CO2 23 01/16/2020 0523   GLUCOSE 90 01/16/2020 0523   BUN 13 01/16/2020 0523   BUN 20 07/31/2015 1001   CREATININE 0.99 01/16/2020 0523   CALCIUM 8.2 (L) 01/16/2020 0523   PROT 8.2 (H) 01/14/2020 2348   PROT 6.7 07/31/2015 1001   ALBUMIN 4.1 01/14/2020 2348   ALBUMIN 3.9 07/31/2015 1001   AST 25 01/14/2020 2348   ALT 21 01/14/2020 2348   ALKPHOS 90 01/14/2020 2348   BILITOT 0.5 01/14/2020 2348   BILITOT 0.4 07/31/2015 1001   GFRNONAA >60 01/16/2020 0523   GFRAA >60 01/16/2020 0523   Lipase     Component Value Date/Time   LIPASE 28 01/14/2020 2348       Studies/Results: CT ABDOMEN PELVIS W CONTRAST  Result Date: 01/15/2020 CLINICAL DATA:  Abdominal pain and cramping. Concern for bowel obstruction. EXAM: CT ABDOMEN AND PELVIS WITH CONTRAST TECHNIQUE: Multidetector CT imaging of the abdomen and pelvis was performed using the standard protocol following bolus administration of intravenous contrast. CONTRAST:  164m OMNIPAQUE IOHEXOL 300 MG/ML  SOLN COMPARISON:  08/01/2018 FINDINGS: Lower chest: The lung bases are clear. The heart size is normal. Hepatobiliary: The liver is normal. Normal gallbladder.There is no biliary ductal dilation. Pancreas: Normal contours without ductal dilatation. No peripancreatic fluid collection. Spleen: No splenic laceration or hematoma. Adrenals/Urinary Tract: --Adrenal glands: No adrenal hemorrhage. --Right kidney/ureter: No hydronephrosis or perinephric hematoma. --Left kidney/ureter: No hydronephrosis or perinephric hematoma. --Urinary bladder: Unremarkable. Stomach/Bowel: --Stomach/Duodenum: The stomach is moderately distended. --Small bowel: There is evidence for a small bowel obstruction with multiple dilated loops of small bowel measuring up to approximately 3.2 cm. There appears to be a transition point in the left lower quadrant (axial series 2, image 57). There is a right lower quadrant end ileostomy in place. --Colon: The patient is status post total colectomy. --Appendix: Surgically  absent. Vascular/Lymphatic: Atherosclerotic calcification is present within the non-aneurysmal abdominal aorta, without hemodynamically significant stenosis. --No retroperitoneal lymphadenopathy. --No mesenteric lymphadenopathy. --No pelvic or inguinal lymphadenopathy. Reproductive: Status post hysterectomy. No adnexal mass. Other: There is a small amount of free fluid in the patient's abdomen and pelvis, likely reactive. The abdominal  wall is normal. Musculoskeletal. No acute displaced fractures. IMPRESSION: 1. Small-bowel obstruction with a transition point in the left lower quadrant. 2. Status post total colectomy. 3. Right lower quadrant and ileostomy without evidence for a complication. 4. Small amount of free fluid in the patient's abdomen and pelvis, likely reactive. Aortic Atherosclerosis (ICD10-I70.0). Electronically Signed   By: Constance Holster M.D.   On: 01/15/2020 01:21   DG Abd Portable 1V  Result Date: 01/16/2020 CLINICAL DATA:  Follow-up small bowel obstruction EXAM: PORTABLE ABDOMEN - 1 VIEW COMPARISON:  01/15/2020 FINDINGS: The NG tube is in good position with its tip in the antral region of the stomach. Improved bowel gas pattern with less small bowel distension and interval clearing of contrast material. No free air. The lung bases are grossly clear. Right lower quadrant ostomy again noted. IMPRESSION: 1. Improving bowel gas pattern. 2. NG tube in good position. Electronically Signed   By: Marijo Sanes M.D.   On: 01/16/2020 05:27   DG Abd Portable 1V-Small Bowel Obstruction Protocol-initial, 8 hr delay  Result Date: 01/15/2020 CLINICAL DATA:  8 hour delay for a small-bowel protocol. Contrast given at 04:15 today. EXAM: PORTABLE ABDOMEN - 1 VIEW COMPARISON:  Same day, 0304 hours and CT abdomen pelvis 01/15/2020. FINDINGS: Nasogastric tube terminates in the stomach with the side port in the region of the gastroesophageal junction. Oral contrast is seen in the stomach, duodenum and dilated proximal small bowel. Right lower quadrant ileostomy. No definite colonic oral contrast. Contrast is seen in the bladder. IMPRESSION: 1. Small bowel obstruction, as on CT performed earlier the same day. 2. Nasogastric tube terminates in the stomach with the side port at the gastroesophageal junction. Electronically Signed   By: Lorin Picket M.D.   On: 01/15/2020 12:30   DG Abd Portable 1V-Small Bowel Protocol-Position  Verification  Result Date: 01/15/2020 CLINICAL DATA:  64 year old female NG tube placement. Small bowel obstruction. EXAM: PORTABLE ABDOMEN - 1 VIEW COMPARISON:  CT Abdomen and Pelvis earlier today. FINDINGS: Portable AP supine view at 0304 hours. Enteric tube placed with side hole at the level of the gastric fundus. Excreted IV contrast in the renal collecting systems. Negative lung bases. Paucity of bowel gas. Lower lumbar spine degeneration. IMPRESSION: Enteric tube placed into the stomach with side hole at the level of the gastric fundus. Electronically Signed   By: Genevie Ann M.D.   On: 01/15/2020 03:14    Anti-infectives: Anti-infectives (From admission, onward)   None       Assessment/Plan DM H/O UC with total colectomy and ileostomy HTN HLD Anemia  SBO -clinically and radiographically improving -Dc NGT -CLD -ambulate  FEN - CLD VTE - Lovenox ID - none   LOS: 1 day    Henreitta Cea , [facility] 01/16/2020, 8:31 AM Please see Amion for pager number during day hours 7:00am-4:30pm or 7:00am -11:30am on weekends

## 2020-01-17 LAB — CBC WITH DIFFERENTIAL/PLATELET
Abs Immature Granulocytes: 0.01 10*3/uL (ref 0.00–0.07)
Basophils Absolute: 0 10*3/uL (ref 0.0–0.1)
Basophils Relative: 1 %
Eosinophils Absolute: 0.3 10*3/uL (ref 0.0–0.5)
Eosinophils Relative: 4 %
HCT: 36.3 % (ref 36.0–46.0)
Hemoglobin: 11.5 g/dL — ABNORMAL LOW (ref 12.0–15.0)
Immature Granulocytes: 0 %
Lymphocytes Relative: 38 %
Lymphs Abs: 3.1 10*3/uL (ref 0.7–4.0)
MCH: 28.5 pg (ref 26.0–34.0)
MCHC: 31.7 g/dL (ref 30.0–36.0)
MCV: 90.1 fL (ref 80.0–100.0)
Monocytes Absolute: 0.5 10*3/uL (ref 0.1–1.0)
Monocytes Relative: 6 %
Neutro Abs: 4.1 10*3/uL (ref 1.7–7.7)
Neutrophils Relative %: 51 %
Platelets: 307 10*3/uL (ref 150–400)
RBC: 4.03 MIL/uL (ref 3.87–5.11)
RDW: 13.3 % (ref 11.5–15.5)
WBC: 8 10*3/uL (ref 4.0–10.5)
nRBC: 0 % (ref 0.0–0.2)

## 2020-01-17 LAB — BASIC METABOLIC PANEL
Anion gap: 5 (ref 5–15)
BUN: 9 mg/dL (ref 8–23)
CO2: 24 mmol/L (ref 22–32)
Calcium: 8.4 mg/dL — ABNORMAL LOW (ref 8.9–10.3)
Chloride: 108 mmol/L (ref 98–111)
Creatinine, Ser: 1 mg/dL (ref 0.44–1.00)
GFR calc Af Amer: >60 mL/min (ref >60)
GFR calc non Af Amer: 59 mL/min — ABNORMAL LOW (ref >60)
Glucose, Bld: 84 mg/dL (ref 70–99)
Potassium: 4.1 mmol/L (ref 3.5–5.1)
Sodium: 137 mmol/L (ref 135–145)

## 2020-01-17 LAB — PHOSPHORUS: Phosphorus: 2.5 mg/dL (ref 2.5–4.6)

## 2020-01-17 LAB — MAGNESIUM: Magnesium: 2.2 mg/dL (ref 1.7–2.4)

## 2020-01-17 MED ORDER — CHLORHEXIDINE GLUCONATE CLOTH 2 % EX PADS: 6.0000 | MEDICATED_PAD | Freq: Every day | CUTANEOUS | Status: DC

## 2020-01-17 MED ORDER — SODIUM CHLORIDE 0.9% FLUSH: 10.0000 mL | INTRAVENOUS | Status: DC | PRN

## 2020-01-17 MED ORDER — SODIUM CHLORIDE 0.9% FLUSH: 10.0000 mL | Freq: Two times a day (BID) | INTRAVENOUS | Status: DC

## 2020-01-17 NOTE — Discharge Summary (Signed)
Physician Discharge Summary  Latasha Wood Latasha Wood DOB: 09/02/1956 DOA: 01/14/2020  PCP: Janith Lima, MD  Admit date: 01/14/2020 Discharge date: 01/17/2020  Admitted From: Home Disposition: Home  Recommendations for Outpatient Follow-up:  1. Follow up with PCP in 1-2 weeks  Discharge Condition: Stable CODE STATUS: Full code Diet recommendation: Liquid and soft diet, low-salt diet  Discharge summary: 64 year old female with history of ulcerative colitis status post colectomy with ileostomy, hypertension, hyperlipidemia. Previous history of small bowel obstructions presented to the emergency room with sudden onset of abdominal pain and cramping as well no passage of stool into the ileostomy bag. She was found to have a small bowel obstruction with persistent findings admitted to the hospital.  Patient was admitted to the hospital with surgical consultation.  Was treated conservatively with n.p.o., IV fluids, NG tube drainage with ultimate resolution of symptoms and return of bowel function.  Today tolerating soft diet.  As per surgical recommendation, going home, she will stay on liquid and soft diet for next few days and follow-up as needed at surgical office.  No changes in medication done.  Electrolytes were replaced and adequate at the time of discharge.  Discharge Diagnoses:  Principal Problem:   SBO (small bowel obstruction) (HCC) Active Problems:   Hyperlipidemia LDL goal <70   Anxiety   Prediabetes   Ulcerative colitis ([place])   Otalgia of both ears   Ileostomy in place [facility])    Discharge Instructions  Discharge Instructions    Call MD for:  persistant nausea and vomiting   Complete by: As directed    Call MD for:  severe uncontrolled pain   Complete by: As directed    Diet - low sodium heart healthy   Complete by: As directed    Increase activity slowly   Complete by: As directed      Allergies as of 01/17/2020      Reactions   Betadine [povidone  Iodine]    Latex Rash   Redness of skin   Tape Rash   Redness of skin OK with hypofix tape and paper tape! NO transpore tape ,adhesive tape!      Medication List    STOP taking these medications   loperamide 2 MG capsule Commonly known as: IMODIUM     TAKE these medications   cholecalciferol 25 MCG (1000 UNIT) tablet Commonly known as: VITAMIN D3 Take 1,000 Units by mouth daily.   multivitamin with minerals Tabs tablet Take 1 tablet by mouth daily.   olmesartan-hydrochlorothiazide 40-12.5 MG tablet Commonly known as: BENICAR HCT HOLD FOR NOW      Follow-up Information    Leighton Ruff, MD Follow up.   Specialty: General Surgery Why: Follow up as needed  Contact information: [address] [place] [address] [phone]          Allergies  Allergen Reactions  . Betadine [Povidone Iodine]   . Latex Rash    Redness of skin  . Tape Rash    Redness of skin OK with hypofix tape and paper tape! NO transpore tape ,adhesive tape!    Consultations:  General surgery   Procedures/Studies: CT ABDOMEN PELVIS W CONTRAST  Result Date: 01/15/2020 CLINICAL DATA:  Abdominal pain and cramping. Concern for bowel obstruction. EXAM: CT ABDOMEN AND PELVIS WITH CONTRAST TECHNIQUE: Multidetector CT imaging of the abdomen and pelvis was performed using the standard protocol following bolus administration of intravenous contrast. CONTRAST:  154m OMNIPAQUE IOHEXOL 300 MG/ML  SOLN COMPARISON:  08/01/2018 FINDINGS: Lower chest: The lung bases are clear. The heart size is normal. Hepatobiliary: The liver is normal. Normal gallbladder.There is no biliary ductal dilation. Pancreas: Normal contours without ductal dilatation. No peripancreatic fluid collection. Spleen: No splenic laceration or hematoma. Adrenals/Urinary Tract: --Adrenal glands: No adrenal hemorrhage. --Right kidney/ureter: No hydronephrosis or perinephric hematoma. --Left kidney/ureter: No hydronephrosis or  perinephric hematoma. --Urinary bladder: Unremarkable. Stomach/Bowel: --Stomach/Duodenum: The stomach is moderately distended. --Small bowel: There is evidence for a small bowel obstruction with multiple dilated loops of small bowel measuring up to approximately 3.2 cm. There appears to be a transition point in the left lower quadrant (axial series 2, image 57). There is a right lower quadrant end ileostomy in place. --Colon: The patient is status post total colectomy. --Appendix: Surgically absent. Vascular/Lymphatic: Atherosclerotic calcification is present within the non-aneurysmal abdominal aorta, without hemodynamically significant stenosis. --No retroperitoneal lymphadenopathy. --No mesenteric lymphadenopathy. --No pelvic or inguinal lymphadenopathy. Reproductive: Status post hysterectomy. No adnexal mass. Other: There is a small amount of free fluid in the patient's abdomen and pelvis, likely reactive. The abdominal wall is normal. Musculoskeletal. No acute displaced fractures. IMPRESSION: 1. Small-bowel obstruction with a transition point in the left lower quadrant. 2. Status post total colectomy. 3. Right lower quadrant and ileostomy without evidence for a complication. 4. Small amount of free fluid in the patient's abdomen and pelvis, likely reactive. Aortic Atherosclerosis (ICD10-I70.0). Electronically Signed   By: Constance Holster M.D.   On: 01/15/2020 01:21   DG Abd Portable 1V  Result Date: 01/16/2020 CLINICAL DATA:  Follow-up small bowel obstruction EXAM: PORTABLE ABDOMEN - 1 VIEW COMPARISON:  01/15/2020 FINDINGS: The NG tube is in good position with its tip in the antral region of the stomach. Improved bowel gas pattern with less small bowel distension and interval clearing of contrast material. No free air. The lung bases are grossly clear. Right lower quadrant ostomy again noted. IMPRESSION: 1. Improving bowel gas pattern. 2. NG tube in good position. Electronically Signed   By: Marijo Sanes  M.D.   On: 01/16/2020 05:27   DG Abd Portable 1V-Small Bowel Obstruction Protocol-initial, 8 hr delay  Result Date: 01/15/2020 CLINICAL DATA:  8 hour delay for a small-bowel protocol. Contrast given at 04:15 today. EXAM: PORTABLE ABDOMEN - 1 VIEW COMPARISON:  Same day, 0304 hours and CT abdomen pelvis 01/15/2020. FINDINGS: Nasogastric tube terminates in the stomach with the side port in the region of the gastroesophageal junction. Oral contrast is seen in the stomach, duodenum and dilated proximal small bowel. Right lower quadrant ileostomy. No definite colonic oral contrast. Contrast is seen in the bladder. IMPRESSION: 1. Small bowel obstruction, as on CT performed earlier the same day. 2. Nasogastric tube terminates in the stomach with the side port at the gastroesophageal junction. Electronically Signed   By: Lorin Picket M.D.   On: 01/15/2020 12:30   DG Abd Portable 1V-Small Bowel Protocol-Position Verification  Result Date: 01/15/2020 CLINICAL DATA:  64 year old female NG tube placement. Small bowel obstruction. EXAM: PORTABLE ABDOMEN - 1 VIEW COMPARISON:  CT Abdomen and Pelvis earlier today. FINDINGS: Portable AP supine view at 0304 hours. Enteric tube placed with side hole at the level of the gastric fundus. Excreted IV contrast in the renal collecting systems. Negative lung bases. Paucity of bowel gas. Lower lumbar spine degeneration. IMPRESSION: Enteric tube placed into the stomach with side hole at the level of the gastric fundus. Electronically Signed   By: Genevie Ann M.D.   On: 01/15/2020 03:14  Subjective: Patient seen and examined.  Excited to have good bowel movements in her ileostomy pouch.  Denies any nausea vomiting.  Has a mild pain around the ileostomy which is chronic.   Discharge Exam: Vitals:   01/16/20 2145 01/17/20 0401  BP: 129/81 (!) 146/91  Pulse: 74 67  Resp: 16 18  Temp: 98.4 F (36.9 C) 98.1 F (36.7 C)  SpO2: 99% 99%   Vitals:   01/16/20 0556 01/16/20 1406  01/16/20 2145 01/17/20 0401  BP: (!) 176/89 135/76 129/81 (!) 146/91  Pulse: 79 87 74 67  Resp: 14 16 16 18   Temp: 99.1 F (37.3 C) 97.9 F (36.6 C) 98.4 F (36.9 C) 98.1 F (36.7 C)  TempSrc: Oral Oral Oral Oral  SpO2: 99% 98% 99% 99%  Weight:      Height:        General: Pt is alert, awake, not in acute distress, on room air. Cardiovascular: RRR, S1/S2 +, no rubs, no gallops Respiratory: CTA bilaterally, no wheezing, no rhonchi Abdominal: Soft, NT, ND, bowel sounds +, right lower quadrant ileostomy pouch with loose stool present. Extremities: no edema, no cyanosis    The results of significant diagnostics from this hospitalization (including imaging, microbiology, ancillary and laboratory) are listed below for reference.     Microbiology: Recent Results (from the past 240 hour(s))  SARS CORONAVIRUS 2 (TAT 6-24 HRS) Nasopharyngeal Nasopharyngeal Swab     Status: None   Collection Time: 01/15/20  2:51 AM   Specimen: Nasopharyngeal Swab  Result Value Ref Range Status   SARS Coronavirus 2 NEGATIVE NEGATIVE Final    Comment: (NOTE) SARS-CoV-2 target nucleic acids are NOT DETECTED. The SARS-CoV-2 RNA is generally detectable in upper and lower respiratory specimens during the acute phase of infection. Negative results do not preclude SARS-CoV-2 infection, do not rule out co-infections with other pathogens, and should not be used as the sole basis for treatment or other patient management decisions. Negative results must be combined with clinical observations, patient history, and epidemiological information. The expected result is Negative. Fact Sheet for Patients: SugarRoll.be Fact Sheet for Healthcare Providers: https://www.woods-mathews.com/ This test is not yet approved or cleared by the [place] FDA and  has been authorized for detection and/or diagnosis of SARS-CoV-2 by FDA under an Emergency Use Authorization (EUA). This  EUA will remain  in effect (meaning this test can be used) for the duration of the COVID-19 declaration under Section 56 4(b)(1) of the Act, 21 U.S.C. section 360bbb-3(b)(1), unless the authorization is terminated or revoked sooner. Performed at [facility], [place] [address] [place], [place] [address]      Labs: BNP (last 3 results) No results for input(s): BNP in the last 8760 hours. Basic Metabolic Panel: Recent Labs  Lab 01/14/20 2348 01/15/20 0451 01/16/20 0523 01/17/20 0356  NA 136 138 141 137  K 4.3 4.4 3.5 4.1  CL 102 105 109 108  CO2 26 24 23 24   GLUCOSE 139* 175* 90 84  BUN 16 14 13 9   CREATININE 1.18* 1.10* 0.99 1.00  CALCIUM 9.6 8.7* 8.2* 8.4*  MG  --   --  1.8 2.2  PHOS  --   --  2.1* 2.5   Liver Function Tests: Recent Labs  Lab 01/14/20 2348  AST 25  ALT 21  ALKPHOS 90  BILITOT 0.5  PROT 8.2*  ALBUMIN 4.1   Recent Labs  Lab 01/14/20 2348  LIPASE 28   No results for input(s): AMMONIA in  the last 168 hours. CBC: Recent Labs  Lab 01/14/20 2348 01/15/20 0451 01/16/20 0523 01/17/20 0356  WBC 12.8* 9.7 11.8* 8.0  NEUTROABS 11.1*  --  7.6 4.1  HGB 13.5 12.3 11.5* 11.5*  HCT 41.2 37.1 34.9* 36.3  MCV 87.8 87.1 87.7 90.1  PLT 385 320 310 307   Cardiac Enzymes: No results for input(s): CKTOTAL, CKMB, CKMBINDEX, TROPONINI in the last 168 hours. BNP: Invalid input(s): POCBNP CBG: No results for input(s): GLUCAP in the last 168 hours. D-Dimer No results for input(s): DDIMER in the last 72 hours. Hgb A1c No results for input(s): HGBA1C in the last 72 hours. Lipid Profile No results for input(s): CHOL, HDL, LDLCALC, TRIG, CHOLHDL, LDLDIRECT in the last 72 hours. Thyroid function studies No results for input(s): TSH, T4TOTAL, T3FREE, THYROIDAB in the last 72 hours.  Invalid input(s): FREET3 Anemia work up No results for input(s): VITAMINB12, FOLATE, FERRITIN, TIBC, IRON, RETICCTPCT in the last 72 hours. Urinalysis    Component Value  Date/Time   COLORURINE YELLOW 01/15/2020 0100   APPEARANCEUR HAZY (A) 01/15/2020 0100   LABSPEC 1.039 (H) 01/15/2020 0100   PHURINE 5.0 01/15/2020 0100   GLUCOSEU NEGATIVE 01/15/2020 0100   GLUCOSEU NEGATIVE 09/14/2019 0944   HGBUR NEGATIVE 01/15/2020 0100   BILIRUBINUR NEGATIVE 01/15/2020 0100   KETONESUR NEGATIVE 01/15/2020 0100   PROTEINUR NEGATIVE 01/15/2020 0100   UROBILINOGEN 0.2 09/14/2019 0944   NITRITE NEGATIVE 01/15/2020 0100   LEUKOCYTESUR NEGATIVE 01/15/2020 0100   Sepsis Labs Invalid input(s): PROCALCITONIN,  WBC,  LACTICIDVEN Microbiology Recent Results (from the past 240 hour(s))  SARS CORONAVIRUS 2 (TAT 6-24 HRS) Nasopharyngeal Nasopharyngeal Swab     Status: None   Collection Time: 01/15/20  2:51 AM   Specimen: Nasopharyngeal Swab  Result Value Ref Range Status   SARS Coronavirus 2 NEGATIVE NEGATIVE Final    Comment: (NOTE) SARS-CoV-2 target nucleic acids are NOT DETECTED. The SARS-CoV-2 RNA is generally detectable in upper and lower respiratory specimens during the acute phase of infection. Negative results do not preclude SARS-CoV-2 infection, do not rule out co-infections with other pathogens, and should not be used as the sole basis for treatment or other patient management decisions. Negative results must be combined with clinical observations, patient history, and epidemiological information. The expected result is Negative. Fact Sheet for Patients: SugarRoll.be Fact Sheet for Healthcare Providers: https://www.woods-mathews.com/ This test is not yet approved or cleared by the [place] FDA and  has been authorized for detection and/or diagnosis of SARS-CoV-2 by FDA under an Emergency Use Authorization (EUA). This EUA will remain  in effect (meaning this test can be used) for the duration of the COVID-19 declaration under Section 56 4(b)(1) of the Act, 21 U.S.C. section 360bbb-3(b)(1), unless the  authorization is terminated or revoked sooner. Performed at [facility], [place] [address], [place], [place] [address]      Time coordinating discharge:  40 minutes  SIGNED:   Barb Merino, MD  [facility] 01/17/2020, 1:12 PM

## 2020-01-17 NOTE — Progress Notes (Signed)
Patient ID: Latasha Wood, female   DOB: 08/27/1956, 64 y.o.   MRN: [id number]       Subjective: Doing great today.  Tolerating CLD, ileostomy working well.  Still a little mild residual soreness, no nausea  ROS: See above, otherwise other systems negative  Objective: Vital signs in last 24 hours: Temp:  [97.9 F (36.6 C)-98.4 F (36.9 C)] 98.1 F (36.7 C) (03/10 0401) Pulse Rate:  [67-87] 67 (03/10 0401) Resp:  [16-18] 18 (03/10 0401) BP: (129-146)/(76-91) 146/91 (03/10 0401) SpO2:  [98 %-99 %] 99 % (03/10 0401) Last BM Date: 01/16/20  Intake/Output from previous day: 03/09 0701 - 03/10 0700 In: 3221.9 [P.O.:1420; I.V.:1010; IV Piggyback:791.9] Out: 2800 [Urine:1750; Stool:1050] Intake/Output this shift: No intake/output data recorded.  PE: Heart: regular Lungs: CTAB Abd: soft, minimally tender, ileostomy working well, +BS, ND  Lab Results:  Recent Labs    01/16/20 0523 01/17/20 0356  WBC 11.8* 8.0  HGB 11.5* 11.5*  HCT 34.9* 36.3  PLT 310 307   BMET Recent Labs    01/16/20 0523 01/17/20 0356  NA 141 137  K 3.5 4.1  CL 109 108  CO2 23 24  GLUCOSE 90 84  BUN 13 9  CREATININE 0.99 1.00  CALCIUM 8.2* 8.4*   PT/INR No results for input(s): LABPROT, INR in the last 72 hours. CMP     Component Value Date/Time   NA 137 01/17/2020 0356   NA 139 07/31/2015 1001   K 4.1 01/17/2020 0356   CL 108 01/17/2020 0356   CO2 24 01/17/2020 0356   GLUCOSE 84 01/17/2020 0356   BUN 9 01/17/2020 0356   BUN 20 07/31/2015 1001   CREATININE 1.00 01/17/2020 0356   CALCIUM 8.4 (L) 01/17/2020 0356   PROT 8.2 (H) 01/14/2020 2348   PROT 6.7 07/31/2015 1001   ALBUMIN 4.1 01/14/2020 2348   ALBUMIN 3.9 07/31/2015 1001   AST 25 01/14/2020 2348   ALT 21 01/14/2020 2348   ALKPHOS 90 01/14/2020 2348   BILITOT 0.5 01/14/2020 2348   BILITOT 0.4 07/31/2015 1001   GFRNONAA 59 (L) 01/17/2020 0356   GFRAA >60 01/17/2020 0356   Lipase     Component Value Date/Time   LIPASE 28 01/14/2020 2348       Studies/Results: DG Abd Portable 1V  Result Date: 01/16/2020 CLINICAL DATA:  Follow-up small bowel obstruction EXAM: PORTABLE ABDOMEN - 1 VIEW COMPARISON:  01/15/2020 FINDINGS: The NG tube is in good position with its tip in the antral region of the stomach. Improved bowel gas pattern with less small bowel distension and interval clearing of contrast material. No free air. The lung bases are grossly clear. Right lower quadrant ostomy again noted. IMPRESSION: 1. Improving bowel gas pattern. 2. NG tube in good position. Electronically Signed   By: Marijo Sanes M.D.   On: 01/16/2020 05:27   DG Abd Portable 1V-Small Bowel Obstruction Protocol-initial, 8 hr delay  Result Date: 01/15/2020 CLINICAL DATA:  8 hour delay for a small-bowel protocol. Contrast given at 04:15 today. EXAM: PORTABLE ABDOMEN - 1 VIEW COMPARISON:  Same day, 0304 hours and CT abdomen pelvis 01/15/2020. FINDINGS: Nasogastric tube terminates in the stomach with the side port in the region of the gastroesophageal junction. Oral contrast is seen in the stomach, duodenum and dilated proximal small bowel. Right lower quadrant ileostomy. No definite colonic oral contrast. Contrast is seen in the bladder. IMPRESSION: 1. Small bowel obstruction, as on CT performed earlier the same day. 2. Nasogastric tube  terminates in the stomach with the side port at the gastroesophageal junction. Electronically Signed   By: Lorin Picket M.D.   On: 01/15/2020 12:30    Anti-infectives: Anti-infectives (From admission, onward)   None       Assessment/Plan DM H/O UC with total colectomy and ileostomy HTN HLD Anemia  SBO -soft diet -stable for DC home today from our standpoint -she may follow up with Dr. Marcello Moores prn  FEN -soft VTE -Lovenox ID -none   LOS: 2 days    Henreitta Cea , [facility] 01/17/2020, 8:18 AM Please see Amion for pager number during day hours 7:00am-4:30pm  or 7:00am -11:30am on weekends

## 2020-01-17 NOTE — Discharge Instructions (Signed)
Bowel Obstruction A bowel obstruction means that something is blocking the small or large bowel. The bowel is also called the intestine. It is the long tube that connects the stomach to the opening of the butt (anus). When something blocks the bowel, food and fluids cannot pass through like normal. This condition needs to be treated. Treatment depends on the cause of the problem and how bad the problem is. What are the causes? Common causes of this condition include:  Scar tissue (adhesions) from past surgery or from high-energy X-rays (radiation).  Recent surgery in the belly. This affects how food moves in the bowel.  Some diseases, such as: ? Irritation of the lining of the digestive tract (Crohn's disease). ? Irritation of small pouches in the bowel (diverticulitis).  Growths or tumors.  A bulging organ (hernia).  Twisting of the bowel (volvulus).  A foreign body.  Slipping of a part of the bowel into another part (intussusception). What are the signs or symptoms? Symptoms of this condition include:  Pain in the belly.  Feeling sick to your stomach (nauseous).  Throwing up (vomiting).  Bloating in the belly.  Being unable to pass gas.  Trouble pooping (constipation).  Watery poop (diarrhea).  A lot of belching. How is this diagnosed? This condition may be diagnosed based on:  A physical exam.  Medical history.  Imaging tests, such as X-ray or CT scan.  Blood tests.  Urine tests. How is this treated? Treatment for this condition may include:  Fluids and pain medicines that are given through an IV tube. Your doctor may tell you not to eat or drink if you feel sick to your stomach and are throwing up.  Eating a clear liquid diet for a few days.  Putting a small tube (nasogastric tube) into the stomach. This will help with pain, discomfort, and nausea by removing blocked air and fluids from the stomach.  Surgery. This may be needed if other treatments do  not work. Follow these instructions at home: Medicines  Take over-the-counter and prescription medicines only as told by your doctor.  If you were prescribed an antibiotic medicine, take it as told by your doctor. Do not stop taking the antibiotic even if you start to feel better. General instructions  Follow your diet as told by your doctor. You may need to: ? Only drink clear liquids until you start to get better. ? Avoid solid foods.  Return to your normal activities as told by your doctor. Ask your doctor what activities are safe for you.  Do not sit for a long time without moving. Get up to take short walks every 1-2 hours. This is important. Ask for help if you feel weak or unsteady.  Keep all follow-up visits as told by your doctor. This is important. How is this prevented? After having a bowel obstruction, you may be more likely to have another. You can do some things to stop it from happening again.  If you have a long-term (chronic) disease, contact your doctor if you see changes or problems.  Take steps to prevent or treat trouble pooping. Your doctor may ask that you: ? Drink enough fluid to keep your pee (urine) pale yellow. ? Take over-the-counter or prescription medicines. ? Eat foods that are high in fiber. These include beans, whole grains, and fresh fruits and vegetables. ? Limit foods that are high in fat and sugar. These include fried or sweet foods.  Stay active. Ask your doctor which exercises are  safe for you.  Avoid stress.  Eat three small meals and three small snacks each day.  Work with a Publishing rights manager (dietitian) to make a meal plan that works for you.  Do not use any products that contain nicotine or tobacco, such as cigarettes and e-cigarettes. If you need help quitting, ask your doctor. Contact a doctor if:  You have a fever.  You have chills. Get help right away if:  You have pain or cramps that get worse.  You throw up blood.  You are  sick to your stomach.  You cannot stop throwing up.  You cannot drink fluids.  You feel mixed up (confused).  You feel very thirsty (dehydrated).  Your belly gets more bloated.  You feel weak or you pass out (faint). Summary  A bowel obstruction means that something is blocking the small or large bowel.  Treatment may include IV fluids and pain medicine. You may also have a clear liquid diet, a small tube in your stomach, or surgery.  Drink clear liquids and avoid solid foods until you get better. This information is not intended to replace advice given to you by your health care provider. Make sure you discuss any questions you have with your health care provider. Document Revised: 03/09/2018 Document Reviewed: 03/09/2018 Elsevier Patient Education  [place].

## 2020-01-17 NOTE — Progress Notes (Signed)
Discharge instructions given to pt and all questions were answered.  

## 2020-01-19 ENCOUNTER — Telehealth: Payer: Self-pay | Admitting: *Deleted

## 2020-01-19 NOTE — Telephone Encounter (Signed)
Pt was on TCM report  admitted 01/10/20 to the hospital with surgical consultation. Previous history of small bowel obstructions presented to the emergency room with sudden onset of abdominal pain and cramping as well no passage of stool into the ileostomy bag. She was found to have a small bowel obstruction. Pt D/C 01/17/20, and follow-up w/surgeon in 2 weeks.Marland KitchenJohny Chess

## 2020-01-25 ENCOUNTER — Telehealth: Payer: Self-pay | Admitting: Internal Medicine

## 2020-01-25 NOTE — Progress Notes (Signed)
  Chronic Care Management   Outreach Note  01/25/2020 Name: Latasha Wood MRN: [id number] DOB: Mar 14, 1956  Referred by: Janith Lima, MD Reason for referral : No chief complaint on file.   An unsuccessful telephone outreach was attempted today. The patient was referred to the pharmacist for assistance with care management and care coordination.   Follow Up Plan:   [name] [employer] Scheduler

## 2020-02-05 ENCOUNTER — Telehealth: Payer: Self-pay | Admitting: Internal Medicine

## 2020-02-05 NOTE — Progress Notes (Signed)
  Chronic Care Management   Outreach Note  02/05/2020 Name: Latasha Wood MRN: [id number] DOB: 11/26/1955  Referred by: Janith Lima, MD Reason for referral : No chief complaint on file.   A second unsuccessful telephone outreach was attempted today. The patient was referred to pharmacist for assistance with care management and care coordination.  Follow Up Plan:   [name] [employer] Scheduler

## 2020-02-23 ENCOUNTER — Telehealth: Payer: Self-pay | Admitting: Internal Medicine

## 2020-02-23 DIAGNOSIS — K51918 Ulcerative colitis, unspecified with other complication: Secondary | ICD-10-CM

## 2020-02-23 NOTE — Progress Notes (Signed)
  Chronic Care Management   Note  02/23/2020 Name: ANJOLIE MAJER MRN: [id number] DOB: 06-26-1956  [name] is a 64 y.o. year old female who is a primary care patient of Janith Lima, MD. I reached out to [name] by phone today in response to a referral sent by Ms. Shelley C Haupt's PCP, Janith Lima, MD.   Ms. Oros was given information about Chronic Care Management services today including:  1. CCM service includes personalized support from designated clinical staff supervised by her physician, including individualized plan of care and coordination with other care providers 2. 24/7 contact phone numbers for assistance for urgent and routine care needs. 3. Service will only be billed when office clinical staff spend 20 minutes or more in a month to coordinate care. 4. Only one practitioner may furnish and bill the service in a calendar month. 5. The patient may stop CCM services at any time (effective at the end of the month) by phone call to the office staff.   Patient agreed to services and verbal consent obtained.   Follow up plan:   [name] [employer] Scheduler

## 2020-02-28 ENCOUNTER — Other Ambulatory Visit: Payer: Self-pay | Admitting: Internal Medicine

## 2020-02-28 DIAGNOSIS — E785 Hyperlipidemia, unspecified: Secondary | ICD-10-CM

## 2020-02-28 DIAGNOSIS — I10 Essential (primary) hypertension: Secondary | ICD-10-CM

## 2020-03-06 DIAGNOSIS — E041 Nontoxic single thyroid nodule: Secondary | ICD-10-CM | POA: Diagnosis not present

## 2020-03-06 DIAGNOSIS — I10 Essential (primary) hypertension: Secondary | ICD-10-CM | POA: Diagnosis not present

## 2020-03-07 LAB — COMPREHENSIVE METABOLIC PANEL
ALT: 18 IU/L (ref 0–32)
AST: 22 IU/L (ref 0–40)
Albumin/Globulin Ratio: 1.4 (ref 1.2–2.2)
Albumin: 4.1 g/dL (ref 3.8–4.8)
Alkaline Phosphatase: 88 IU/L (ref 39–117)
BUN/Creatinine Ratio: 11 — ABNORMAL LOW (ref 12–28)
BUN: 13 mg/dL (ref 8–27)
Bilirubin Total: 0.5 mg/dL (ref 0.0–1.2)
CO2: 23 mmol/L (ref 20–29)
Calcium: 10.5 mg/dL — ABNORMAL HIGH (ref 8.7–10.3)
Chloride: 98 mmol/L (ref 96–106)
Creatinine, Ser: 1.17 mg/dL — ABNORMAL HIGH (ref 0.57–1.00)
GFR calc Af Amer: 57 mL/min/{1.73_m2} — ABNORMAL LOW (ref >59)
GFR calc non Af Amer: 49 mL/min/{1.73_m2} — ABNORMAL LOW (ref >59)
Globulin, Total: 2.9 g/dL (ref 1.5–4.5)
Glucose: 77 mg/dL (ref 65–99)
Potassium: 4.5 mmol/L (ref 3.5–5.2)
Sodium: 135 mmol/L (ref 134–144)
Total Protein: 7 g/dL (ref 6.0–8.5)

## 2020-03-07 LAB — THYROID PANEL WITH TSH
Free Thyroxine Index: 2.3 (ref 1.2–4.9)
T3 Uptake Ratio: 29 % (ref 24–39)
T4, Total: 8 ug/dL (ref 4.5–12.0)
TSH: 1.08 u[IU]/mL (ref 0.450–4.500)

## 2020-03-14 ENCOUNTER — Other Ambulatory Visit: Payer: Self-pay | Admitting: Internal Medicine

## 2020-03-14 ENCOUNTER — Encounter: Payer: Self-pay | Admitting: *Deleted

## 2020-03-14 DIAGNOSIS — E785 Hyperlipidemia, unspecified: Secondary | ICD-10-CM

## 2020-03-14 DIAGNOSIS — I10 Essential (primary) hypertension: Secondary | ICD-10-CM

## 2020-03-15 ENCOUNTER — Encounter: Payer: Self-pay | Admitting: Internal Medicine

## 2020-03-18 NOTE — Addendum Note (Signed)
Addended by: Karle Barr on: 03/18/2020 08:02 AM   Modules accepted: Orders

## 2020-03-20 ENCOUNTER — Encounter: Payer: Self-pay | Admitting: Internal Medicine

## 2020-03-20 ENCOUNTER — Telehealth: Payer: Medicare Other

## 2020-03-20 ENCOUNTER — Telehealth: Payer: Self-pay | Admitting: Internal Medicine

## 2020-03-20 ENCOUNTER — Other Ambulatory Visit: Payer: Self-pay

## 2020-03-20 ENCOUNTER — Ambulatory Visit: Payer: Medicare Other | Admitting: Physician Assistant

## 2020-03-20 VITALS — BP 121/88 | HR 71 | Temp 98.7°F | Resp 18 | Ht 67.0 in

## 2020-03-20 DIAGNOSIS — Z8639 Personal history of other endocrine, nutritional and metabolic disease: Secondary | ICD-10-CM | POA: Diagnosis not present

## 2020-03-20 DIAGNOSIS — I10 Essential (primary) hypertension: Secondary | ICD-10-CM

## 2020-03-20 DIAGNOSIS — R944 Abnormal results of kidney function studies: Secondary | ICD-10-CM | POA: Diagnosis not present

## 2020-03-20 DIAGNOSIS — E781 Pure hyperglyceridemia: Secondary | ICD-10-CM | POA: Diagnosis not present

## 2020-03-20 DIAGNOSIS — D649 Anemia, unspecified: Secondary | ICD-10-CM

## 2020-03-20 DIAGNOSIS — L309 Dermatitis, unspecified: Secondary | ICD-10-CM

## 2020-03-20 MED ORDER — HYDROCORTISONE 2.5 % EX CREA: TOPICAL_CREAM | Freq: Two times a day (BID) | CUTANEOUS | 0 refills | Status: DC

## 2020-03-20 MED ORDER — OLMESARTAN MEDOXOMIL-HCTZ 40-12.5 MG PO TABS: 1.0000 | ORAL_TABLET | Freq: Every day | ORAL | 1 refills | Status: DC

## 2020-03-20 NOTE — Progress Notes (Signed)
Established Patient Office Visit  Subjective:  Patient ID: Latasha Wood, female    DOB: 21-Dec-1955  Age: 64 y.o. MRN: [id number]  CC:  Chief Complaint  Patient presents with  . Follow-up  . Hyperlipidemia    HPI Latasha Wood reports that she has been working on lifestyle modifications, has been going to the gym, eating healthy, increasing hydration, states she has lost approximately 20 pounds.  Reports that she did not start the cholesterol medication as prescribed, requests having her cholesterol rechecked today.  Reports that she has been having dry skin on her lower right leg, feels a small bump that is sometimes tender.  Reports she noticed it approximately 1 week ago.  Denies trauma or injury.  Reports that she has been having some numbness in her toes, but does endorse that it happens while she is exercising and then resolves afterwards.  Does endorse that she previously had to be prescribed vitamin D 50,000 units once a week for 12 weeks.  Is currently taking vitamin D over-the-counter.  Reports that she was hospitalized in March for a small bowel obstruction, states that her blood pressure was elevated at that time and she was given blood pressure medication.  Reports she has been taking her blood pressure medication on a daily basis since then.  Reports that she monitors her blood pressure on a daily basis and will take half of a tablet if her blood pressure is below normal limits.   Past Medical History:  Diagnosis Date  . Anemia   . Anxiety   . Blood transfusion   . Colitis, ulcerative ([place])   . Colon polyps   . Hyperlipidemia   . Hypertension   . Ileostomy present ([place])   . Numbness and tingling    hands and feet bilat   . Shortness of breath dyspnea    talking or walking   . Type 2 diabetes mellitus with complication, without long-term current use of insulin ([place]) 09/05/2015   currently on no medications, (05/26/2016) pt denies diabetes.  States that  she had one time high blood sugars d/t prednisone    Past Surgical History:  Procedure Laterality Date  . ABDOMINAL HYSTERECTOMY  2000  . ABDOMINAL SURGERY    . BREAST CYST ASPIRATION    . CATARACT EXTRACTION W/PHACO Right 10/20/2017   Procedure: CATARACT EXTRACTION PHACO AND INTRAOCULAR LENS PLACEMENT (IOC);  Surgeon: [name], MD;  Location: [facility] ORS;  Service: Ophthalmology;  Laterality: Right;  [place] 00:50.1 AP% 13.2 CDE 6.61 Fluid Pack lot # [id number]  . COLON SURGERY     colostomy  . cortisol shot     in left shoulder  . DILATION AND CURETTAGE OF UTERUS     most likely after miscarriage  . fibrocystic breast disease     q 6 month mammogram  . gravida 6 para 2     all SAB  . hyadiform mole    . ILEO LOOP DIVERSION N/A 09/25/2015   Procedure: ILEO LOOP COLOSTOMY;  Surgeon: Leighton Ruff, MD;  Location: [facility];  Service: General;  Laterality: N/A;  . ILEOSTOMY CLOSURE N/A 10/08/2015   Procedure: ILEOSTOMY REVISION;  Surgeon: Leighton Ruff, MD;  Location: [facility];  Service: General;  Laterality: N/A;  . ILEOSTOMY CLOSURE N/A 08/20/2016   Procedure: OPEN RELOCATION OF ILEOSTOMY;  Surgeon: Leighton Ruff, MD;  Location: [facility];  Service: General;  Laterality: N/A;  . IR GENERIC HISTORICAL  09/01/2016   IR US GUIDE VASC  ACCESS RIGHT 09/01/2016 Ascencion Dike, PA-C [facility]  . IR GENERIC HISTORICAL  09/01/2016   IR FLUORO GUIDE CV LINE RIGHT 09/01/2016 Ascencion Dike, PA-C [facility]  . KNEE ARTHROSCOPY W/ MENISCAL REPAIR     right knee ([place])  . LAPAROSCOPIC SMALL BOWEL RESECTION N/A 09/25/2015   Procedure: LAPAROSCOPIC BOWEL RESECTION TIMES TWO;  Surgeon: Leighton Ruff, MD;  Location: [facility];  Service: General;  Laterality: N/A;  . ROBOTIC ASSISTED LAPAROSCOPIC LYSIS OF ADHESION N/A 09/25/2015   Procedure: XI ROBOTIC ASSISTED LAPAROSCOPIC LYSIS OF ADHESION;  Surgeon: Leighton Ruff, MD;  Location: [facility];  Service: General;  Laterality: N/A;  90 minutes  . Small  bowel obstruction      Family History  Problem Relation Age of Onset  . Diabetes Mother   . Breast cancer Mother 47  . Diabetes Father   . Heart disease Father   . Diabetes Sister   . Diabetes Brother   . Breast cancer Sister 42  . Colon cancer Paternal Aunt   . Breast cancer Maternal Aunt     Social History   Socioeconomic History  . Marital status: Married    Spouse name: Not on file  . Number of children: 2  . Years of education: 27  . Highest education level: Not on file  Occupational History  . 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 Smoking status: Never Smoker  . Smokeless tobacco: Never Used  Substance and Sexual Activity  . Alcohol use: Yes    Alcohol/week: 2.0 standard drinks    Types: 2 Standard drinks or equivalent per week    Comment: occ  . Drug use: No  . Sexual activity: Yes    Partners: Male  Other Topics Concern  . Not on file  Social History Narrative   HSG, [employer] - BA, [employer] for med tech.  Married '75. 2 dtrs - '79, '82; 1 grandchild. Marriage is in good health. No abuse issues.    Social Determinants of Health   Financial Resource Strain:   . Difficulty of Paying Living Expenses:   Food Insecurity:   . Worried About Charity fundraiser in the Last Year:   . Arboriculturist in the Last Year:   Transportation Needs:   . Film/video editor (Medical):   Marland Kitchen Lack of Transportation (Non-Medical):   Physical Activity:   . Days of Exercise per Week:   . Minutes of Exercise per Session:   Stress:   . Feeling of Stress :   Social Connections:   . Frequency of Communication with Friends and Family:   . Frequency of Social Gatherings with Friends and Family:   . Attends Religious Services:   . Active Member of Clubs or Organizations:   . Attends Archivist Meetings:   Marland Kitchen Marital Status:   Intimate Partner Violence:   . Fear of Current or Ex-Partner:   . Emotionally Abused:    Marland Kitchen Physically Abused:   . Sexually Abused:     Outpatient Medications Prior to Visit  Medication Sig Dispense Refill  . cholecalciferol (VITAMIN D3) 25 MCG (1000 UNIT) tablet Take 1,000 Units by mouth daily.    Marland Kitchen olmesartan-hydrochlorothiazide (BENICAR HCT) 40-12.5 MG tablet HOLD FOR NOW 90 tablet 1  . Multiple Vitamin (MULTIVITAMIN WITH MINERALS) TABS tablet Take 1 tablet by mouth daily.     No facility-administered medications prior to visit.  Allergies  Allergen Reactions  . Betadine [Povidone Iodine]   . Latex Rash    Redness of skin  . Tape Rash    Redness of skin OK with hypofix tape and paper tape! NO transpore tape ,adhesive tape!    ROS Review of Systems  Constitutional: Negative.   HENT: Negative.   Eyes: Negative.   Respiratory: Negative.   Cardiovascular: Negative.   Gastrointestinal: Negative for abdominal pain.  Endocrine: Negative.   Genitourinary: Negative.   Musculoskeletal: Negative.   Skin: Negative for color change and rash.  Allergic/Immunologic: Negative.   Neurological: Negative.   Hematological: Negative.   Psychiatric/Behavioral: Negative.       Objective:    Physical Exam  Constitutional: She is oriented to person, place, and time. She appears well-developed and well-nourished.  HENT:  Head: Normocephalic and atraumatic.  Right Ear: External ear normal.  Left Ear: External ear normal.  Nose: Nose normal.  Mouth/Throat: Oropharynx is clear and moist.  Eyes: Pupils are equal, round, and reactive to light. Conjunctivae and EOM are normal.  Cardiovascular: Normal rate and regular rhythm.  Pulmonary/Chest: Effort normal and breath sounds normal.  Abdominal: Soft.  Musculoskeletal:        General: Normal range of motion.     Cervical back: Normal range of motion and neck supple.  Neurological: She is alert and oriented to person, place, and time. She has normal reflexes.  Skin: Skin is warm, dry and intact. No purpura noted. Rash is  not urticarial.     Psychiatric: She has a normal mood and affect. Her behavior is normal. Judgment and thought content normal.  Nursing note and vitals reviewed.   BP 121/88 (BP Location: Left Arm, Patient Position: Sitting, Cuff Size: Large)   Pulse 71   Temp 98.7 F (37.1 C) (Oral)   Resp 18   Ht 5' 7"  (1.702 m)   BMI 28.98 kg/m  Wt Readings from Last 3 Encounters:  01/15/20 185 lb (83.9 kg)  01/03/20 188 lb (85.3 kg)  09/14/19 189 lb (85.7 kg)     Health Maintenance Due  Topic Date Due  . COVID-19 Vaccine (1) Never done  . OPHTHALMOLOGY EXAM  08/13/2017  . COLONOSCOPY  04/08/2018  . HEMOGLOBIN A1C  03/13/2020    There are no preventive care reminders to display for this patient.  Lab Results  Component Value Date   TSH 1.080 03/06/2020   Lab Results  Component Value Date   WBC 8.0 01/17/2020   HGB 11.5 (L) 01/17/2020   HCT 36.3 01/17/2020   MCV 90.1 01/17/2020   PLT 307 01/17/2020   Lab Results  Component Value Date   NA 135 03/06/2020   K 4.5 03/06/2020   CO2 23 03/06/2020   GLUCOSE 77 03/06/2020   BUN 13 03/06/2020   CREATININE 1.17 (H) 03/06/2020   BILITOT 0.5 03/06/2020   ALKPHOS 88 03/06/2020   AST 22 03/06/2020   ALT 18 03/06/2020   PROT 7.0 03/06/2020   ALBUMIN 4.1 03/06/2020   CALCIUM 10.5 (H) 03/06/2020   ANIONGAP 5 01/17/2020   GFR 65.91 09/14/2019   Lab Results  Component Value Date   CHOL 207 (H) 09/14/2019   Lab Results  Component Value Date   HDL 86.60 09/14/2019   Lab Results  Component Value Date   LDLCALC 100 (H) 09/14/2019   Lab Results  Component Value Date   TRIG 99.0 09/14/2019   Lab Results  Component Value Date   CHOLHDL 2 09/14/2019  Lab Results  Component Value Date   HGBA1C 5.6 09/14/2019      Assessment & Plan:   Problem List Items Addressed This Visit      Cardiovascular and Mediastinum   Essential hypertension - Primary   Relevant Medications   olmesartan-hydrochlorothiazide (BENICAR HCT)  40-12.5 MG tablet     Other   Pure hyperglyceridemia   Relevant Medications   olmesartan-hydrochlorothiazide (BENICAR HCT) 40-12.5 MG tablet   Other Relevant Orders   Lipid panel    Other Visit Diagnoses    History of vitamin D deficiency       Relevant Orders   Vitamin D, 25-hydroxy   Abnormal kidney function study       Relevant Orders   Basic Metabolic Panel   Anemia, unspecified type       Relevant Orders   Iron, TIBC and Ferritin Panel   Dermatitis       Relevant Medications   hydrocortisone 2.5 % cream    1. Essential hypertension Blood pressure is currently well controlled, patient previously had held medication due to hypotension, continue to monitor blood pressure on a daily basis  Patient is concerned with decreased kidney function, recheck BMP, consider lisinopril  Gave patient reassurance on lump on lower right leg, intermittent numbness in toes.  Encouraged proper footwear.  Gave patient encouragement to continue working on lifestyle modifications   - olmesartan-hydrochlorothiazide (BENICAR HCT) 40-12.5 MG tablet; Take 1 tablet by mouth daily.  Dispense: 90 tablet; Refill: 1  2. Hx of vit d def  - Vitamin D, 25-hydroxy  3. Pure hyperglyceridemia  - Lipid panel  4. Abnormal kidney function study  - Basic Metabolic Panel  5. Anemia, unspecified type  - Iron, TIBC and Ferritin Panel  6. Dermatitis Encourage patient to use lotion on dry areas - hydrocortisone 2.5 % cream; Apply topically 2 (two) times daily.  Dispense: 30 g; Refill: 0   Meds ordered this encounter  Medications  . olmesartan-hydrochlorothiazide (BENICAR HCT) 40-12.5 MG tablet    Sig: Take 1 tablet by mouth daily.    Dispense:  90 tablet    Refill:  1    Order Specific Question:   Supervising Provider    Answer:   Joya Gaskins, PATRICK E [[id number]]  . hydrocortisone 2.5 % cream    Sig: Apply topically 2 (two) times daily.    Dispense:  30 g    Refill:  0    Order Specific Question:    Supervising Provider    Answer:   Elsie Stain [[id number]]    Follow-up: Return if symptoms worsen or fail to improve.    Latasha Grip Mayers, PA-C

## 2020-03-20 NOTE — Patient Instructions (Signed)
I will reach out to you via MyChart once your cholesterol panel has returned to let you know what your overall risk of a cardiovascular event in the next 10 years will be.  I encourage you to use lotion on your legs after showering while your legs are still slightly wet if that does not resolve, it is okay to use a small amount of the cortisone cream.  Keep up the great work with your weight loss efforts.  Write down 5 whys of why you want to live a healthy lifestyle and keep them present!  Please let us know if there is anything else that we can do for you, thank you for trusting [place] with your care  Kennieth Rad, PA-C Physician Assistant [facility] [email or web address]   Atopic Dermatitis Atopic dermatitis is a skin disorder that causes inflammation of the skin. This is the most common type of eczema. Eczema is a group of skin conditions that cause the skin to be itchy, red, and swollen. This condition is generally worse during the cooler winter months and often improves during the warm summer months. Symptoms can vary from person to person. Atopic dermatitis usually starts showing signs in infancy and can last through adulthood. This condition cannot be passed from one person to another (non-contagious), but it is more common in families. Atopic dermatitis may not always be present. When it is present, it is called a flare-up. What are the causes? The exact cause of this condition is not known. Flare-ups of the condition may be triggered by:  Contact with something that you are sensitive or allergic to.  Stress.  Certain foods.  Extremely hot or cold weather.  Harsh chemicals and soaps.  Dry air.  Chlorine. What increases the risk? This condition is more likely to develop in people who have a personal history or family history of eczema, allergies, asthma, or hay fever. What are the signs or symptoms? Symptoms of this  condition include:  Dry, scaly skin.  Red, itchy rash.  Itchiness, which can be severe. This may occur before the skin rash. This can make sleeping difficult.  Skin thickening and cracking that can occur over time. How is this diagnosed? This condition is diagnosed based on your symptoms, a medical history, and a physical exam. How is this treated? There is no cure for this condition, but symptoms can usually be controlled. Treatment focuses on:  Controlling the itchiness and scratching. You may be given medicines, such as antihistamines or steroid creams.  Limiting exposure to things that you are sensitive or allergic to (allergens).  Recognizing situations that cause stress and developing a plan to manage stress. If your atopic dermatitis does not get better with medicines, or if it is all over your body (widespread), a treatment using a specific type of light (phototherapy) may be used. Follow these instructions at home: Skin care   Keep your skin well-moisturized. Doing this seals in moisture and helps to prevent dryness. ? Use unscented lotions that have petroleum in them. ? Avoid lotions that contain alcohol or water. They can dry the skin.  Keep baths or showers short (less than 5 minutes) in warm water. Do not use hot water. ? Use mild, unscented cleansers for bathing. Avoid soap and bubble bath. ? Apply a moisturizer to your skin right after a bath or shower.  Do not apply anything to your skin without checking with your health care provider. General instructions  Dress in clothes made of  cotton or cotton blends. Dress lightly because heat increases itchiness.  When washing your clothes, rinse your clothes twice so all of the soap is removed.  Avoid any triggers that can cause a flare-up.  Try to manage your stress.  Keep your fingernails cut short.  Avoid scratching. Scratching makes the rash and itchiness worse. It may also result in a skin infection (impetigo)  due to a break in the skin caused by scratching.  Take or apply over-the-counter and prescription medicines only as told by your health care provider.  Keep all follow-up visits as told by your health care provider. This is important.  Do not be around people who have cold sores or fever blisters. If you get the infection, it may cause your atopic dermatitis to worsen. Contact a health care provider if:  Your itchiness interferes with sleep.  Your rash gets worse or it is not better within one week of starting treatment.  You have a fever.  You have a rash flare-up after having contact with someone who has cold sores or fever blisters. Get help right away if:  You develop pus or soft yellow scabs in the rash area. Summary  This condition causes a red rash and itchy, dry, scaly skin.  Treatment focuses on controlling the itchiness and scratching, limiting exposure to things that you are sensitive or allergic to (allergens), recognizing situations that cause stress, and developing a plan to manage stress.  Keep your skin well-moisturized.  Keep baths or showers shorter than 5 minutes and use warm water. Do not use hot water. This information is not intended to replace advice given to you by your health care provider. Make sure you discuss any questions you have with your health care provider. Document Revised: 02/14/2019 Document Reviewed: 11/27/2016 Elsevier Patient Education  [place].

## 2020-03-20 NOTE — Telephone Encounter (Signed)
    Patient calling to discuss pharmacist telephone visit scheduled for 5/12 States she never received a call.

## 2020-03-21 LAB — BASIC METABOLIC PANEL
BUN/Creatinine Ratio: 14 (ref 12–28)
BUN: 13 mg/dL (ref 8–27)
CO2: 26 mmol/L (ref 20–29)
Calcium: 9.8 mg/dL (ref 8.7–10.3)
Chloride: 104 mmol/L (ref 96–106)
Creatinine, Ser: 0.95 mg/dL (ref 0.57–1.00)
GFR calc Af Amer: 73 mL/min/{1.73_m2} (ref >59)
GFR calc non Af Amer: 63 mL/min/{1.73_m2} (ref >59)
Glucose: 91 mg/dL (ref 65–99)
Potassium: 4.4 mmol/L (ref 3.5–5.2)
Sodium: 140 mmol/L (ref 134–144)

## 2020-03-21 LAB — LIPID PANEL
Chol/HDL Ratio: 2.3 ratio (ref 0.0–4.4)
Cholesterol, Total: 209 mg/dL — ABNORMAL HIGH (ref 100–199)
HDL: 91 mg/dL (ref >39)
LDL Chol Calc (NIH): 101 mg/dL — ABNORMAL HIGH (ref 0–99)
Triglycerides: 98 mg/dL (ref 0–149)
VLDL Cholesterol Cal: 17 mg/dL (ref 5–40)

## 2020-03-21 LAB — IRON,TIBC AND FERRITIN PANEL
Ferritin: 77 ng/mL (ref 15–150)
Iron Saturation: 11 % — ABNORMAL LOW (ref 15–55)
Iron: 38 ug/dL (ref 27–139)
Total Iron Binding Capacity: 341 ug/dL (ref 250–450)
UIBC: 303 ug/dL (ref 118–369)

## 2020-03-21 LAB — VITAMIN D 25 HYDROXY (VIT D DEFICIENCY, FRACTURES): Vit D, 25-Hydroxy: 45.7 ng/mL (ref 30.0–100.0)

## 2020-03-21 NOTE — Telephone Encounter (Signed)
[employer] scheduler Mykiah Schmuck attempted to reach patient on 03/20/20 to reschedule her initial appointment due to pharmacist being out of office due to illness. Message was left on voicemail to return call to reschedule appt at [phone].

## 2020-05-16 ENCOUNTER — Ambulatory Visit: Payer: Medicare Other | Admitting: Pharmacist

## 2020-05-16 ENCOUNTER — Other Ambulatory Visit: Payer: Self-pay

## 2020-05-16 DIAGNOSIS — I10 Essential (primary) hypertension: Secondary | ICD-10-CM

## 2020-05-16 DIAGNOSIS — E785 Hyperlipidemia, unspecified: Secondary | ICD-10-CM

## 2020-05-16 NOTE — Chronic Care Management (AMB) (Signed)
Chronic Care Management Pharmacy  Name: Latasha Wood  MRN: [id number] DOB: 1956-11-06   Chief Complaint/ HPI  Latasha Wood,  64 y.o. , female presents for their Initial CCM visit with the clinical pharmacist via telephone due to COVID-19 Pandemic.  PCP : Janith Lima, MD Patient Care Team: Janith Lima, MD as PCP - General (Internal Medicine) Lafayette Dragon, MD (Inactive) (Gastroenterology) Brien Few, MD (Obstetrics and Gynecology) Kem Parkinson, MD (Ophthalmology) Leighton Ruff, MD as Consulting Physician (Colon and Rectal Surgery) Charlton Haws, [facility] as Pharmacist (Pharmacist)  Their chronic conditions include: Hypertension, Hyperlipidemia, GERD, Anxiety and Ulcerative colitis s/p colectomy w/ ileostomy  Her and a friend challenged each other to weight loss - she wants to get to 165, hovering around 168. Taking care of her grandchildren (age 61, 7) for a month, daughters are going to [place].   Office Visits: 09/14/19 Dr Ronnald Ramp OV: BP controlled, labs stable, continue ARB-thiazide. Started atorvastatin 10 mg for elevated ASCVD risk.  Consult Visit: 03/20/20 PA Cari Mayers (mobile clinic): pt was previously holding olmesartan-HCTZ due to hypotension. Given hydrocortisone cream for dermatitis  01/14/20 Hospital admission: SBO tx'd NPO, IV fluid, NG tube drainage. Discharged w/ liquid/soft diet for few days and f/u prn. No med changes.  01/03/20 Dr Joya Gaskins (pulmonary): hold olmesartan-HCTZ due to hypotension and concern for dehydration.  10/19/19 Dr Lucia Gaskins (ENT): thyroid nodule f/u. Schedule US-guided aspirate  Allergies  Allergen Reactions  . Betadine [Povidone Iodine]   . Latex Rash    Redness of skin  . Tape Rash    Redness of skin OK with hypofix tape and paper tape! NO transpore tape ,adhesive tape!    Medications: Outpatient Encounter Medications as of 05/16/2020  Medication Sig Note  . ALPRAZolam (XANAX) 0.25 MG tablet Take 0.25 mg by  mouth 2 (two) times daily as needed for anxiety. 05/16/2020: Pt takes very rarely - 1 rx lasts a year  . cholecalciferol (VITAMIN D3) 25 MCG (1000 UNIT) tablet Take 1,000 Units by mouth daily.   . hydrocortisone 2.5 % cream Apply topically 2 (two) times daily.   Marland Kitchen loperamide (IMODIUM) 2 MG capsule Take 2 mg by mouth as needed for diarrhea or loose stools.   . Multiple Vitamin (MULTIVITAMIN WITH MINERALS) TABS tablet Take 1 tablet by mouth daily.   Marland Kitchen olmesartan-hydrochlorothiazide (BENICAR HCT) 40-12.5 MG tablet Take 1 tablet by mouth daily. (Patient taking differently: Take 0.5 tablets by mouth daily. )   . vitamin B-12 (CYANOCOBALAMIN) 1000 MCG tablet Take 1,000 mcg by mouth daily.    No facility-administered encounter medications on file as of 05/16/2020.     Current Diagnosis/Assessment:  SDOH Interventions     Most Recent Value  SDOH Interventions  Financial Strain Interventions Intervention Not Indicated      Goals Addressed            This Visit's Progress   . Pharmacy Care Plan       CARE PLAN ENTRY (see longitudinal plan of care for additional care plan information)  Current Barriers:  . Chronic Disease Management support, education, and care coordination needs related to Hypertension and Hyperlipidemia   Hypertension BP Readings from Last 3 Encounters:  03/20/20 121/88  01/17/20 126/77  01/03/20 122/84 .  Pharmacist Clinical Goal(s): o Over the next 180 days, patient will work with PharmD and providers to maintain BP goal <130/80 . Current regimen:  o Olmesartan-HCTZ 40-12.5 mg - 1/2 tablet daily . Interventions: o Discussed BP  goals and benefits of medication for prevention of heart attack / stroke . Patient self care activities - Over the next 180 days, patient will: o Check BP daily, document, and provide at future appointments o Ensure daily salt intake < 2300 mg/day  Hyperlipidemia Lab Results  Component Value Date/Time   LDLCALC 101 (H) 03/20/2020 10:16  AM   LDLDIRECT 114.0 04/29/2017 10:12 AM .  Pharmacist Clinical Goal(s): o Over the next 180 days, patient will work with PharmD and providers to achieve LDL goal < 100 . Current regimen:  o No medications . Interventions: o Discussed cholesterol goals and benefits of statins for prevention of heart attack / stroke. Statin will lower LDL 30-50% with low risk of side effects. . Patient self care activities - Over the next 180 days, patient will: o Consider starting atorvastatin 10 mg as previously prescribed  Medication management . Pharmacist Clinical Goal(s): o Over the next 180 days, patient will work with PharmD and providers to maintain optimal medication adherence . [facility] . Interventions o Comprehensive medication review performed. o Continue current medication management strategy . Patient self care activities - Over the next 180 days, patient will: o Focus on medication adherence by patient report o Take medications as prescribed o Report any questions or concerns to PharmD and/or provider(s)  Initial goal documentation       Hypertension   BP goal is:  <130/80  Office blood pressures are  BP Readings from Last 3 Encounters:  03/20/20 121/88  01/17/20 126/77  01/03/20 122/84   Kidney Function  Lab Results  Component Value Date/Time   CREATININE 0.95 03/20/2020 10:16 AM   CREATININE 1.17 (H) 03/06/2020 10:12 AM   GFR 65.91 09/14/2019 09:44 AM   GFRNONAA 63 03/20/2020 10:16 AM   GFRAA 73 03/20/2020 10:16 AM   K 4.4 03/20/2020 10:16 AM   K 4.5 03/06/2020 10:12 AM   Patient checks BP at home daily Patient home BP readings are ranging: sometimes low 100s/60  Patient has failed these meds in the past: n/a Patient is currently controlled on the following medications:  . Olmesartan-HCTZ 40-12.5 mg - 1/2 tablet daily  We discussed: pt takes 1/2 tab most days due to issues with hypotension with whole tab previously; she had stopped it for 3-4  weeks, and BP went up to 140/80s, so she restarted it at 1/2 tab  Plan  Continue current medications     Hyperlipidemia   LDL goal < 100 *prescribed statin in 09/2019, pt did not start taking it, she has lost weight ~20lbs since Feb, however LDL has not changed since Nov  Lipid Panel     Component Value Date/Time   CHOL 209 (H) 03/20/2020 1016   TRIG 98 03/20/2020 1016   HDL 91 03/20/2020 1016   LDLCALC 101 (H) 03/20/2020 1016   LDLDIRECT 114.0 04/29/2017 1012    Hepatic Function Latest Ref Rng & Units 03/06/2020 01/14/2020 09/14/2019  Total Protein 6.0 - 8.5 g/dL 7.0 8.2(H) 7.5  Albumin 3.8 - 4.8 g/dL 4.1 4.1 4.0  AST 0 - 40 IU/L _0 ALT 0 - 32 IU/L _1 Alk Phosphatase 39 - 117 IU/L 88 90 90  Total Bilirubin 0.0 - 1.2 mg/dL 0.5 0.5 0.4  Bilirubin, Direct 0.0 - 0.3 mg/dL - - 0.1    ASCVD risk 7.4% (pt is not diabetic)  The 10-year ASCVD risk score Mikey Bussing DC Jr., et al., 2013) is: 16.9%   Values used  to calculate the score:     Age: 70 years     Sex: Female     Is Non-Hispanic African American: Yes     Diabetic: Yes - pt is not diabetic, so this calculation is incorrect     Tobacco smoker: No     Systolic Blood Pressure: 676 mmHg     Is BP treated: Yes     HDL Cholesterol: 91 mg/dL     Total Cholesterol: 209 mg/dL  Patient has failed these meds in past: atorvastatin 10 mg Patient is currently uncontrolled on the following medications:  . No medications  We discussed:  Pt did not start taking atorvastatin when it was prescribed last year; she had ASCVD re-calculated at mobile clinic in May and found she was below the threshold to treat. She worked on wt loss and exercise, but LDL did not change from Nov to May.  Discussed statin benefit/risks at length; as pt is technically below the ASCVD threshold to treat (<7.5%) the likely statin benefit is marginal; discussed newer research about very low LDL and reduced cardiovascular risk; pt will consider risk/benefit of  starting a statin and may decide to start  Plan  Continue control with diet and exercise  Vitamin D deficiency   Lab Results  Component Value Date/Time   VD25OH 45.7 03/20/2020 10:16 AM   Patient has failed these meds in past: Vitamin D 50,000 IU weekly Patient is currently controlled on the following medications:  Marland Kitchen Vitamin D 1000 IU daily  We discussed:    Anxiety   GAD 7 : Generalized Anxiety Score 04/29/2017  Nervous, Anxious, on Edge 1  Control/stop worrying 0  Worry too much - different things 0  Trouble relaxing 1  Restless 1  Easily annoyed or irritable 0  Afraid - awful might happen 0  Total GAD 7 Score 3   Patient has failed these meds in past: n/a Patient is currently controlled on the following medications:  . Alprazolam 0.25 mg BID prn  We discussed:  Takes very infrequently; one rx usually lasts the whole year.  Plan  Continue current medications  Health Maintenance   Patient is currently controlled on the following medications:  Marland Kitchen Multivitamin . Hydrocortisone 2.5% cream . Vitamin B12 ODT . Loperamide PRN  We discussed:  Patient is satisfied with current OTC regimen and denies issues  Plan  Continue current medications  Medication Management   [facility] Uses pill box? No - prefers bottle Pt endorses 100% compliance  We discussed: Pt is satisfied with current pharmacy services  Plan  Continue current medication management strategy    Follow up: 6 month phone visit  Charlene Brooke, PharmD Clinical Pharmacist [facility] at [facility] [phone]

## 2020-05-16 NOTE — Patient Instructions (Addendum)
Visit Information  Phone number for Pharmacist: [phone]  Thank you for meeting with me to discuss your medications! I look forward to working with you to achieve your health care goals. Below is a summary of what we talked about during the visit:  Goals Addressed            This Visit's Progress   . Pharmacy Care Plan       CARE PLAN ENTRY (see longitudinal plan of care for additional care plan information)  Current Barriers:  . Chronic Disease Management support, education, and care coordination needs related to Hypertension and Hyperlipidemia   Hypertension BP Readings from Last 3 Encounters:  03/20/20 121/88  01/17/20 126/77  01/03/20 122/84 .  Pharmacist Clinical Goal(s): o Over the next 180 days, patient will work with PharmD and providers to maintain BP goal <130/80 . Current regimen:  o Olmesartan-HCTZ 40-12.5 mg - 1/2 tablet daily . Interventions: o Discussed BP goals and benefits of medication for prevention of heart attack / stroke . Patient self care activities - Over the next 180 days, patient will: o Check BP daily, document, and provide at future appointments o Ensure daily salt intake < 2300 mg/day  Hyperlipidemia Lab Results  Component Value Date/Time   LDLCALC 101 (H) 03/20/2020 10:16 AM   LDLDIRECT 114.0 04/29/2017 10:12 AM .  Pharmacist Clinical Goal(s): o Over the next 180 days, patient will work with PharmD and providers to achieve LDL goal < 100 . Current regimen:  o No medications . Interventions: o Discussed cholesterol goals and benefits of statins for prevention of heart attack / stroke. Statin will lower LDL 30-50% with low risk of side effects. . Patient self care activities - Over the next 180 days, patient will: o Consider starting atorvastatin 10 mg as previously prescribed  Medication management . Pharmacist Clinical Goal(s): o Over the next 180 days, patient will work with PharmD and providers to maintain optimal medication  adherence . [facility] . Interventions o Comprehensive medication review performed. o Continue current medication management strategy . Patient self care activities - Over the next 180 days, patient will: o Focus on medication adherence by patient report o Take medications as prescribed o Report any questions or concerns to PharmD and/or provider(s)  Initial goal documentation       Ms. Tieken was given information about Chronic Care Management services today including:  1. CCM service includes personalized support from designated clinical staff supervised by her physician, including individualized plan of care and coordination with other care providers 2. 24/7 contact phone numbers for assistance for urgent and routine care needs. 3. Standard insurance, coinsurance, copays and deductibles apply for chronic care management only during months in which we provide at least 20 minutes of these services. Most insurances cover these services at 100%, however patients may be responsible for any copay, coinsurance and/or deductible if applicable. This service may help you avoid the need for more expensive face-to-face services. 4. Only one practitioner may furnish and bill the service in a calendar month. 5. The patient may stop CCM services at any time (effective at the end of the month) by phone call to the office staff.  Patient agreed to services and verbal consent obtained.   The patient verbalized understanding of instructions provided today and agreed to receive a mailed copy of patient instruction and/or educational materials. Telephone follow up appointment with pharmacy team member scheduled for: 6 months  Charlene Brooke, PharmD Clinical Pharmacist [facility]  at [place]  Cholesterol Content in Foods Cholesterol is a waxy, fat-like substance that helps to carry fat in the blood. The body needs cholesterol in small amounts, but too much  cholesterol can cause damage to the arteries and heart. Most people should eat less than 200 milligrams (mg) of cholesterol a day. Foods with cholesterol  Cholesterol is found in animal-based foods, such as meat, seafood, and dairy. Generally, low-fat dairy and lean meats have less cholesterol than full-fat dairy and fatty meats. The milligrams of cholesterol per serving (mg per serving) of common cholesterol-containing foods are listed below. Meat and other proteins  Egg -- one large whole egg has 186 mg.  Veal shank -- 4 oz has 141 mg.  Lean ground [place] (93% lean) -- 4 oz has 118 mg.  Fat-trimmed lamb loin -- 4 oz has 106 mg.  Lean ground beef (90% lean) -- 4 oz has 100 mg.  Lobster -- 3.5 oz has 90 mg.  Pork loin chops -- 4 oz has 86 mg.  Canned salmon -- 3.5 oz has 83 mg.  Fat-trimmed beef top loin -- 4 oz has 78 mg.  Frankfurter -- 1 frank (3.5 oz) has 77 mg.  Crab -- 3.5 oz has 71 mg.  Roasted chicken without skin, white meat -- 4 oz has 66 mg.  Light bologna -- 2 oz has 45 mg.  Deli-cut [place] -- 2 oz has 31 mg.  Canned tuna -- 3.5 oz has 31 mg.  Berniece Salines -- 1 oz has 29 mg.  Oysters and mussels (raw) -- 3.5 oz has 25 mg.  Mackerel -- 1 oz has 22 mg.  Trout -- 1 oz has 20 mg.  Pork sausage -- 1 link (1 oz) has 17 mg.  Salmon -- 1 oz has 16 mg.  Tilapia -- 1 oz has 14 mg. Dairy  Soft-serve ice cream --  cup (4 oz) has 103 mg.  Whole-milk yogurt -- 1 cup (8 oz) has 29 mg.  Cheddar cheese -- 1 oz has 28 mg.  American cheese -- 1 oz has 28 mg.  Whole milk -- 1 cup (8 oz) has 23 mg.  2% milk -- 1 cup (8 oz) has 18 mg.  Cream cheese -- 1 tablespoon (Tbsp) has 15 mg.  Cottage cheese --  cup (4 oz) has 14 mg.  Low-fat (1%) milk -- 1 cup (8 oz) has 10 mg.  Sour cream -- 1 Tbsp has 8.5 mg.  Low-fat yogurt -- 1 cup (8 oz) has 8 mg.  Nonfat Greek yogurt -- 1 cup (8 oz) has 7 mg.  Half-and-half cream -- 1 Tbsp has 5 mg. Fats and oils  Cod liver  oil -- 1 tablespoon (Tbsp) has 82 mg.  Butter -- 1 Tbsp has 15 mg.  Lard -- 1 Tbsp has 14 mg.  Bacon grease -- 1 Tbsp has 14 mg.  Mayonnaise -- 1 Tbsp has 5-10 mg.  Margarine -- 1 Tbsp has 3-10 mg. Exact amounts of cholesterol in these foods may vary depending on specific ingredients and brands. Foods without cholesterol Most plant-based foods do not have cholesterol unless you combine them with a food that has cholesterol. Foods without cholesterol include:  Grains and cereals.  Vegetables.  Fruits.  Vegetable oils, such as olive, canola, and sunflower oil.  Legumes, such as peas, beans, and lentils.  Nuts and seeds.  Egg whites. Summary  The body needs cholesterol in small amounts, but too much cholesterol can cause damage  to the arteries and heart.  Most people should eat less than 200 milligrams (mg) of cholesterol a day. This information is not intended to replace advice given to you by your health care provider. Make sure you discuss any questions you have with your health care provider. Document Revised: 10/08/2017 Document Reviewed: 06/22/2017 Elsevier Patient Education  [place].

## 2020-05-29 DIAGNOSIS — E041 Nontoxic single thyroid nodule: Secondary | ICD-10-CM | POA: Diagnosis not present

## 2020-07-05 ENCOUNTER — Encounter: Payer: Self-pay | Admitting: Internal Medicine

## 2020-07-09 ENCOUNTER — Encounter: Payer: Self-pay | Admitting: Internal Medicine

## 2020-07-09 ENCOUNTER — Encounter: Payer: Self-pay | Admitting: Family

## 2020-07-09 NOTE — Telephone Encounter (Signed)
New Message:   Pt is calling and would like to know what she needs to do and what she needs to take due to her testing positive to covid. Please advise.

## 2020-07-10 ENCOUNTER — Encounter: Payer: Self-pay | Admitting: Family

## 2020-07-10 ENCOUNTER — Telehealth (INDEPENDENT_AMBULATORY_CARE_PROVIDER_SITE_OTHER): Payer: Medicare Other | Admitting: Family

## 2020-07-10 DIAGNOSIS — U071 COVID-19: Secondary | ICD-10-CM | POA: Diagnosis not present

## 2020-07-10 DIAGNOSIS — L237 Allergic contact dermatitis due to plants, except food: Secondary | ICD-10-CM | POA: Diagnosis not present

## 2020-07-10 MED ORDER — CEPHALEXIN 500 MG PO CAPS: 500.0000 mg | ORAL_CAPSULE | Freq: Three times a day (TID) | ORAL | 0 refills | Status: DC

## 2020-07-10 MED ORDER — PREDNISONE 10 MG (21) PO TBPK
ORAL_TABLET | ORAL | 0 refills | Status: DC
Start: 2020-07-10 — End: 2020-10-15

## 2020-07-10 NOTE — Progress Notes (Signed)
Latasha Wood is a 64 y.o. female with the following history as recorded in EpicCare:  Patient Active Problem List   Diagnosis Date Noted  . Ileostomy in place [facility]) 01/15/2020  . GERD without esophagitis 09/08/2017  . Right thyroid nodule 08/17/2016  . Arthropathy in ulcerative colitis ([place]) 03/23/2016  . Ulcerative colitis ([place]) 09/25/2015  . Pure hyperglyceridemia 09/01/2015  . Anxiety 09/01/2015  . SBO (small bowel obstruction) ([place]) 05/17/2015  . Hyperlipidemia LDL goal <70 04/17/2011  . Essential hypertension 12/30/2007    Current Outpatient Medications  Medication Sig Dispense Refill  . ALPRAZolam (XANAX) 0.25 MG tablet Take 0.25 mg by mouth 2 (two) times daily as needed for anxiety.    . cholecalciferol (VITAMIN D3) 25 MCG (1000 UNIT) tablet Take 1,000 Units by mouth daily.    . hydrocortisone 2.5 % cream Apply topically 2 (two) times daily. 30 g 0  . loperamide (IMODIUM) 2 MG capsule Take 2 mg by mouth as needed for diarrhea or loose stools.    . Multiple Vitamin (MULTIVITAMIN WITH MINERALS) TABS tablet Take 1 tablet by mouth daily.    Marland Kitchen olmesartan-hydrochlorothiazide (BENICAR HCT) 40-12.5 MG tablet Take 1 tablet by mouth daily. (Patient taking differently: Take 0.5 tablets by mouth daily. ) 90 tablet 1  . vitamin B-12 (CYANOCOBALAMIN) 1000 MCG tablet Take 1,000 mcg by mouth daily.     No current facility-administered medications for this visit.    Allergies: Betadine [povidone iodine], Latex, and Tape  Past Medical History:  Diagnosis Date  . Anemia   . Anxiety   . Blood transfusion   . Colitis, ulcerative ([place])   . Colon polyps   . Hyperlipidemia   . Hypertension   . Ileostomy present ([place])   . Numbness and tingling    hands and feet bilat   . Shortness of breath dyspnea    talking or walking   . Type 2 diabetes mellitus with complication, without long-term current use of insulin ([place]) 09/05/2015   currently on no medications, (05/26/2016) pt denies  diabetes.  States that she had one time high blood sugars d/t prednisone    Past Surgical History:  Procedure Laterality Date  . ABDOMINAL HYSTERECTOMY  2000  . ABDOMINAL SURGERY    . BREAST CYST ASPIRATION    . CATARACT EXTRACTION W/PHACO Right 10/20/2017   Procedure: CATARACT EXTRACTION PHACO AND INTRAOCULAR LENS PLACEMENT (IOC);  Surgeon: [name], MD;  Location: [facility] ORS;  Service: Ophthalmology;  Laterality: Right;  [place] 00:50.1 AP% 13.2 CDE 6.61 Fluid Pack lot # [id number]  . COLON SURGERY     colostomy  . cortisol shot     in left shoulder  . DILATION AND CURETTAGE OF UTERUS     most likely after miscarriage  . fibrocystic breast disease     q 6 month mammogram  . gravida 6 para 2     all SAB  . hyadiform mole    . ILEO LOOP DIVERSION N/A 09/25/2015   Procedure: ILEO LOOP COLOSTOMY;  Surgeon: Leighton Ruff, MD;  Location: [facility] ORS;  Service: General;  Laterality: N/A;  . ILEOSTOMY CLOSURE N/A 10/08/2015   Procedure: ILEOSTOMY REVISION;  Surgeon: Leighton Ruff, MD;  Location: [facility] ORS;  Service: General;  Laterality: N/A;  . ILEOSTOMY CLOSURE N/A 08/20/2016   Procedure: OPEN RELOCATION OF ILEOSTOMY;  Surgeon: Leighton Ruff, MD;  Location: [facility] ORS;  Service: General;  Laterality: N/A;  . IR GENERIC HISTORICAL  09/01/2016   IR US GUIDE VASC  ACCESS RIGHT 09/01/2016 Ascencion Dike, PA-C [facility]-INTER[facility]  . IR GENERIC HISTORICAL  09/01/2016   IR FLUORO GUIDE CV LINE RIGHT 09/01/2016 Ascencion Dike, PA-C [facility]  . KNEE ARTHROSCOPY W/ MENISCAL REPAIR     right knee ([place])  . LAPAROSCOPIC SMALL BOWEL RESECTION N/A 09/25/2015   Procedure: LAPAROSCOPIC BOWEL RESECTION TIMES TWO;  Surgeon: Leighton Ruff, MD;  Location: [facility];  Service: General;  Laterality: N/A;  . ROBOTIC ASSISTED LAPAROSCOPIC LYSIS OF ADHESION N/A 09/25/2015   Procedure: XI ROBOTIC ASSISTED LAPAROSCOPIC LYSIS OF ADHESION;  Surgeon: Leighton Ruff, MD;  Location: [facility] ORS;  Service: General;  Laterality: N/A;   90 minutes  . Small bowel obstruction      Family History  Problem Relation Age of Onset  . Diabetes Mother   . Breast cancer Mother 79  . Diabetes Father   . Heart disease Father   . Diabetes Sister   . Diabetes Brother   . Breast cancer Sister 9  . Colon cancer Paternal Aunt   . Breast cancer Maternal Aunt     Social History   Tobacco Use  . Smoking status: Never Smoker  . Smokeless tobacco: Never Used  Substance Use Topics  . Alcohol use: Yes    Alcohol/week: 2.0 standard drinks    Types: 2 Standard drinks or equivalent per week    Comment: occ    Subjective:     I connected with Latasha Wood on 07/10/20 at 12:20 PM EDT by a video enabled telemedicine application and verified that I am speaking with the correct person using two identifiers.   I discussed the limitations of evaluation and management by telemedicine and the availability of in person appointments. The patient expressed understanding and agreed to proceed.  + COVID positive as of 8/25; notes she is feeling much better from that perspective- no cough, chest pain or shortness of breath; Was doing yard work recently and now has blistery type rash on both wrists/ arms/ hand; concerned that it could be related to [place]; did send pictures for review over MyChart of the rash;    Objective:  There were no vitals filed for this visit.  General: Well developed, well nourished, in no acute distress  Skin : Warm and dry. Per pictures provided and limited visualization on screen, vesicular lesions noted on both wrists Head: Normocephalic and atraumatic  Lungs: Respirations unlabored;  Neurologic: Alert and oriented; speech intact; face symmetrical;   Assessment:  1. Allergic contact dermatitis due to plants, except food   2. COVID-19     Plan:  Reassurance that do not feel rash has anything to do with recent COVID diagnosis; will treat for contact dermatitis; Patient is essentially asymptomatic at this  time and quarantine will be done on Friday, 9/3; Follow-up in office if symptoms persist after 9/3; due to see her PCP in November for follow-up.  No follow-ups on file.  No orders of the defined types were placed in this encounter.   Requested Prescriptions    No prescriptions requested or ordered in this encounter

## 2020-07-23 DIAGNOSIS — L308 Other specified dermatitis: Secondary | ICD-10-CM | POA: Diagnosis not present

## 2020-07-26 DIAGNOSIS — Z20822 Contact with and (suspected) exposure to covid-19: Secondary | ICD-10-CM | POA: Diagnosis not present

## 2020-08-16 ENCOUNTER — Encounter: Payer: Self-pay | Admitting: Internal Medicine

## 2020-08-21 ENCOUNTER — Encounter: Payer: Self-pay | Admitting: Internal Medicine

## 2020-09-05 DIAGNOSIS — T8131XD Disruption of external operation (surgical) wound, not elsewhere classified, subsequent encounter: Secondary | ICD-10-CM | POA: Diagnosis not present

## 2020-09-05 DIAGNOSIS — K5190 Ulcerative colitis, unspecified, without complications: Secondary | ICD-10-CM | POA: Diagnosis not present

## 2020-09-05 DIAGNOSIS — Z932 Ileostomy status: Secondary | ICD-10-CM | POA: Diagnosis not present

## 2020-09-19 DIAGNOSIS — K5190 Ulcerative colitis, unspecified, without complications: Secondary | ICD-10-CM | POA: Diagnosis not present

## 2020-09-19 DIAGNOSIS — Z932 Ileostomy status: Secondary | ICD-10-CM | POA: Diagnosis not present

## 2020-09-19 DIAGNOSIS — T8131XD Disruption of external operation (surgical) wound, not elsewhere classified, subsequent encounter: Secondary | ICD-10-CM | POA: Diagnosis not present

## 2020-09-22 DIAGNOSIS — Z932 Ileostomy status: Secondary | ICD-10-CM | POA: Diagnosis not present

## 2020-09-22 DIAGNOSIS — K5190 Ulcerative colitis, unspecified, without complications: Secondary | ICD-10-CM | POA: Diagnosis not present

## 2020-09-22 DIAGNOSIS — T8131XD Disruption of external operation (surgical) wound, not elsewhere classified, subsequent encounter: Secondary | ICD-10-CM | POA: Diagnosis not present

## 2020-09-23 ENCOUNTER — Encounter: Payer: Medicare Other | Admitting: Internal Medicine

## 2020-10-01 ENCOUNTER — Encounter: Payer: Medicare Other | Admitting: Internal Medicine

## 2020-10-07 ENCOUNTER — Encounter: Payer: Medicare Other | Admitting: Internal Medicine

## 2020-10-10 DIAGNOSIS — Z20822 Contact with and (suspected) exposure to covid-19: Secondary | ICD-10-CM | POA: Diagnosis not present

## 2020-10-15 ENCOUNTER — Other Ambulatory Visit: Payer: Self-pay

## 2020-10-15 ENCOUNTER — Ambulatory Visit (INDEPENDENT_AMBULATORY_CARE_PROVIDER_SITE_OTHER): Payer: Medicare Other

## 2020-10-15 ENCOUNTER — Encounter: Payer: Self-pay | Admitting: Internal Medicine

## 2020-10-15 ENCOUNTER — Ambulatory Visit (INDEPENDENT_AMBULATORY_CARE_PROVIDER_SITE_OTHER): Payer: Medicare Other | Admitting: Internal Medicine

## 2020-10-15 VITALS — BP 118/76 | HR 76 | Temp 97.6°F | Resp 16 | Ht 64.0 in | Wt 160.0 lb

## 2020-10-15 DIAGNOSIS — F411 Generalized anxiety disorder: Secondary | ICD-10-CM | POA: Diagnosis not present

## 2020-10-15 DIAGNOSIS — R7989 Other specified abnormal findings of blood chemistry: Secondary | ICD-10-CM | POA: Diagnosis not present

## 2020-10-15 DIAGNOSIS — R7303 Prediabetes: Secondary | ICD-10-CM | POA: Diagnosis not present

## 2020-10-15 DIAGNOSIS — D539 Nutritional anemia, unspecified: Secondary | ICD-10-CM | POA: Diagnosis not present

## 2020-10-15 DIAGNOSIS — L2084 Intrinsic (allergic) eczema: Secondary | ICD-10-CM | POA: Insufficient documentation

## 2020-10-15 DIAGNOSIS — E2839 Other primary ovarian failure: Secondary | ICD-10-CM

## 2020-10-15 DIAGNOSIS — Z0000 Encounter for general adult medical examination without abnormal findings: Secondary | ICD-10-CM

## 2020-10-15 DIAGNOSIS — E041 Nontoxic single thyroid nodule: Secondary | ICD-10-CM

## 2020-10-15 DIAGNOSIS — I10 Essential (primary) hypertension: Secondary | ICD-10-CM | POA: Diagnosis not present

## 2020-10-15 DIAGNOSIS — E785 Hyperlipidemia, unspecified: Secondary | ICD-10-CM | POA: Diagnosis not present

## 2020-10-15 DIAGNOSIS — R350 Frequency of micturition: Secondary | ICD-10-CM | POA: Insufficient documentation

## 2020-10-15 DIAGNOSIS — M545 Low back pain, unspecified: Secondary | ICD-10-CM

## 2020-10-15 DIAGNOSIS — G8929 Other chronic pain: Secondary | ICD-10-CM

## 2020-10-15 DIAGNOSIS — Z0001 Encounter for general adult medical examination with abnormal findings: Secondary | ICD-10-CM | POA: Insufficient documentation

## 2020-10-15 DIAGNOSIS — M47816 Spondylosis without myelopathy or radiculopathy, lumbar region: Secondary | ICD-10-CM | POA: Diagnosis not present

## 2020-10-15 DIAGNOSIS — N3281 Overactive bladder: Secondary | ICD-10-CM

## 2020-10-15 LAB — URINALYSIS, ROUTINE W REFLEX MICROSCOPIC
Bilirubin Urine: NEGATIVE
Hgb urine dipstick: NEGATIVE
Ketones, ur: NEGATIVE
Leukocytes,Ua: NEGATIVE
Nitrite: NEGATIVE
Specific Gravity, Urine: 1.01 (ref 1.000–1.030)
Total Protein, Urine: NEGATIVE
Urine Glucose: NEGATIVE
Urobilinogen, UA: 0.2 (ref 0.0–1.0)
pH: 6 (ref 5.0–8.0)

## 2020-10-15 LAB — BASIC METABOLIC PANEL
BUN: 14 mg/dL (ref 6–23)
CO2: 31 mEq/L (ref 19–32)
Calcium: 9.8 mg/dL (ref 8.4–10.5)
Chloride: 99 mEq/L (ref 96–112)
Creatinine, Ser: 1.06 mg/dL (ref 0.40–1.20)
GFR: 55.09 mL/min — ABNORMAL LOW (ref >60.00)
Glucose, Bld: 83 mg/dL (ref 70–99)
Potassium: 4.1 mEq/L (ref 3.5–5.1)
Sodium: 137 mEq/L (ref 135–145)

## 2020-10-15 LAB — HEPATIC FUNCTION PANEL
ALT: 13 U/L (ref 0–35)
AST: 19 U/L (ref 0–37)
Albumin: 3.9 g/dL (ref 3.5–5.2)
Alkaline Phosphatase: 69 U/L (ref 39–117)
Bilirubin, Direct: 0.1 mg/dL (ref 0.0–0.3)
Total Bilirubin: 0.7 mg/dL (ref 0.2–1.2)
Total Protein: 6.9 g/dL (ref 6.0–8.3)

## 2020-10-15 LAB — CBC WITH DIFFERENTIAL/PLATELET
Basophils Absolute: 0.1 10*3/uL (ref 0.0–0.1)
Basophils Relative: 0.9 % (ref 0.0–3.0)
Eosinophils Absolute: 0.2 10*3/uL (ref 0.0–0.7)
Eosinophils Relative: 2.5 % (ref 0.0–5.0)
HCT: 35.4 % — ABNORMAL LOW (ref 36.0–46.0)
Hemoglobin: 12 g/dL (ref 12.0–15.0)
Lymphocytes Relative: 24.8 % (ref 12.0–46.0)
Lymphs Abs: 1.8 10*3/uL (ref 0.7–4.0)
MCHC: 33.9 g/dL (ref 30.0–36.0)
MCV: 86.1 fl (ref 78.0–100.0)
Monocytes Absolute: 0.4 10*3/uL (ref 0.1–1.0)
Monocytes Relative: 5.8 % (ref 3.0–12.0)
Neutro Abs: 4.9 10*3/uL (ref 1.4–7.7)
Neutrophils Relative %: 66 % (ref 43.0–77.0)
Platelets: 304 10*3/uL (ref 150.0–400.0)
RBC: 4.11 Mil/uL (ref 3.87–5.11)
RDW: 13.8 % (ref 11.5–15.5)
WBC: 7.4 10*3/uL (ref 4.0–10.5)

## 2020-10-15 LAB — TSH: TSH: 0.89 u[IU]/mL (ref 0.35–4.50)

## 2020-10-15 LAB — LIPID PANEL
Cholesterol: 215 mg/dL — ABNORMAL HIGH (ref 0–200)
HDL: 99.2 mg/dL (ref >39.00)
LDL Cholesterol: 100 mg/dL — ABNORMAL HIGH (ref 0–99)
NonHDL: 115.93
Total CHOL/HDL Ratio: 2
Triglycerides: 82 mg/dL (ref 0.0–149.0)
VLDL: 16.4 mg/dL (ref 0.0–40.0)

## 2020-10-15 LAB — VITAMIN B12: Vitamin B-12: >1526 pg/mL — ABNORMAL HIGH (ref 211–911)

## 2020-10-15 LAB — FERRITIN: Ferritin: 55.9 ng/mL (ref 10.0–291.0)

## 2020-10-15 LAB — FOLATE: Folate: >23.6 ng/mL (ref >5.9)

## 2020-10-15 LAB — HEMOGLOBIN A1C: Hgb A1c MFr Bld: 5.7 % (ref 4.6–6.5)

## 2020-10-15 LAB — IRON: Iron: 55 ug/dL (ref 42–145)

## 2020-10-15 MED ORDER — HYDROCORTISONE 2.5 % EX CREA: TOPICAL_CREAM | Freq: Two times a day (BID) | CUTANEOUS | 3 refills | Status: DC

## 2020-10-15 MED ORDER — ALPRAZOLAM 0.25 MG PO TABS: 0.2500 mg | ORAL_TABLET | Freq: Two times a day (BID) | ORAL | 3 refills | Status: DC | PRN

## 2020-10-15 NOTE — Patient Instructions (Signed)
Health Maintenance, Female Adopting a healthy lifestyle and getting preventive care are important in promoting health and wellness. Ask your health care provider about:  The right schedule for you to have regular tests and exams.  Things you can do on your own to prevent diseases and keep yourself healthy. What should I know about diet, weight, and exercise? Eat a healthy diet   Eat a diet that includes plenty of vegetables, fruits, low-fat dairy products, and lean protein.  Do not eat a lot of foods that are high in solid fats, added sugars, or sodium. Maintain a healthy weight Body mass index (BMI) is used to identify weight problems. It estimates body fat based on height and weight. Your health care provider can help determine your BMI and help you achieve or maintain a healthy weight. Get regular exercise Get regular exercise. This is one of the most important things you can do for your health. Most adults should:  Exercise for at least 150 minutes each week. The exercise should increase your heart rate and make you sweat (moderate-intensity exercise).  Do strengthening exercises at least twice a week. This is in addition to the moderate-intensity exercise.  Spend less time sitting. Even light physical activity can be beneficial. Watch cholesterol and blood lipids Have your blood tested for lipids and cholesterol at 65 years of age, then have this test every 5 years. Have your cholesterol levels checked more often if:  Your lipid or cholesterol levels are high.  You are older than 65 years of age.  You are at high risk for heart disease. What should I know about cancer screening? Depending on your health history and family history, you may need to have cancer screening at various ages. This may include screening for:  Breast cancer.  Cervical cancer.  Colorectal cancer.  Skin cancer.  Lung cancer. What should I know about heart disease, diabetes, and high blood  pressure? Blood pressure and heart disease  High blood pressure causes heart disease and increases the risk of stroke. This is more likely to develop in people who have high blood pressure readings, are of African descent, or are overweight.  Have your blood pressure checked: ? Every 3-5 years if you are 18-39 years of age. ? Every year if you are 40 years old or older. Diabetes Have regular diabetes screenings. This checks your fasting blood sugar level. Have the screening done:  Once every three years after age 40 if you are at a normal weight and have a low risk for diabetes.  More often and at a younger age if you are overweight or have a high risk for diabetes. What should I know about preventing infection? Hepatitis B If you have a higher risk for hepatitis B, you should be screened for this virus. Talk with your health care provider to find out if you are at risk for hepatitis B infection. Hepatitis C Testing is recommended for:  Everyone born from 1945 through 1965.  Anyone with known risk factors for hepatitis C. Sexually transmitted infections (STIs)  Get screened for STIs, including gonorrhea and chlamydia, if: ? You are sexually active and are younger than 65 years of age. ? You are older than 65 years of age and your health care provider tells you that you are at risk for this type of infection. ? Your sexual activity has changed since you were last screened, and you are at increased risk for chlamydia or gonorrhea. Ask your health care provider if   you are at risk.  Ask your health care provider about whether you are at high risk for HIV. Your health care provider may recommend a prescription medicine to help prevent HIV infection. If you choose to take medicine to prevent HIV, you should first get tested for HIV. You should then be tested every 3 months for as long as you are taking the medicine. Pregnancy  If you are about to stop having your period (premenopausal) and  you may become pregnant, seek counseling before you get pregnant.  Take 400 to 800 micrograms (mcg) of folic acid every day if you become pregnant.  Ask for birth control (contraception) if you want to prevent pregnancy. Osteoporosis and menopause Osteoporosis is a disease in which the bones lose minerals and strength with aging. This can result in bone fractures. If you are 65 years old or older, or if you are at risk for osteoporosis and fractures, ask your health care provider if you should:  Be screened for bone loss.  Take a calcium or vitamin D supplement to lower your risk of fractures.  Be given hormone replacement therapy (HRT) to treat symptoms of menopause. Follow these instructions at home: Lifestyle  Do not use any products that contain nicotine or tobacco, such as cigarettes, e-cigarettes, and chewing tobacco. If you need help quitting, ask your health care provider.  Do not use street drugs.  Do not share needles.  Ask your health care provider for help if you need support or information about quitting drugs. Alcohol use  Do not drink alcohol if: ? Your health care provider tells you not to drink. ? You are pregnant, may be pregnant, or are planning to become pregnant.  If you drink alcohol: ? Limit how much you use to 0-1 drink a day. ? Limit intake if you are breastfeeding.  Be aware of how much alcohol is in your drink. In the [place] one drink equals one 12 oz bottle of beer (355 mL), one 5 oz glass of wine (148 mL), or one 1 oz glass of hard liquor (44 mL). General instructions  Schedule regular health, dental, and eye exams.  Stay current with your vaccines.  Tell your health care provider if: ? You often feel depressed. ? You have ever been abused or do not feel safe at home. Summary  Adopting a healthy lifestyle and getting preventive care are important in promoting health and wellness.  Follow your health care provider's instructions about healthy  diet, exercising, and getting tested or screened for diseases.  Follow your health care provider's instructions on monitoring your cholesterol and blood pressure. This information is not intended to replace advice given to you by your health care provider. Make sure you discuss any questions you have with your health care provider. Document Revised: 10/19/2018 Document Reviewed: 10/19/2018 Elsevier Patient Education  2020 [employer]  

## 2020-10-15 NOTE — Progress Notes (Signed)
Subjective:  Patient ID: Latasha Wood, female    DOB: 12/17/1955  Age: 64 y.o. MRN: [id number]  CC: Anemia, Annual Exam, and Hypertension  This visit occurred during the SARS-CoV-2 public health emergency.  Safety protocols were in place, including screening questions prior to the visit, additional usage of staff PPE, and extensive cleaning of exam room while observing appropriate contact time as indicated for disinfecting solutions.    HPI Latasha Wood presents for a CPX.  1.  She complains of a 2-year history of intermittent, nonradiating low back pain.  Worse on the right than the left.  She denies lower extremity paresthesias.  She had been controlling the pain with meloxicam but recently became concerned about her kidney function so she has been controlling the pain with Tylenol.  2.  She tells me her blood pressure is well controlled.  She denies chest pain, shortness of breath, palpitations, edema, or fatigue.  3.  She complains of anxiety and requests a refill of Xanax.  She denies SI or HI.  She is not willing to take an SSRI.  4.  She has a history of a right thyroid nodule.  This is followed at [facility]  She said she had an ultrasound done there earlier this year.  The nodule does not bother her.  5.  She has a history of DM 2.  She has controlled her blood sugar with lifestyle modifications and has been able to lose weight.  She complains of polyuria but denies polydipsia or polyphagia.  She.  She said she gets up about 3 times a night to urinate and has urinary frequency during the day as well.  She denies dysuria or hematuria.  Outpatient Medications Prior to Visit  Medication Sig Dispense Refill  . cholecalciferol (VITAMIN D3) 25 MCG (1000 UNIT) tablet Take 1,000 Units by mouth daily.    Marland Kitchen loperamide (IMODIUM) 2 MG capsule Take 2 mg by mouth as needed for diarrhea or loose stools.    . Multiple Vitamin (MULTIVITAMIN WITH MINERALS) TABS tablet Take 1  tablet by mouth daily.    . vitamin B-12 (CYANOCOBALAMIN) 1000 MCG tablet Take 1,000 mcg by mouth daily.    Marland Kitchen ALPRAZolam (XANAX) 0.25 MG tablet Take 0.25 mg by mouth 2 (two) times daily as needed for anxiety.    . cephALEXin (KEFLEX) 500 MG capsule Take 1 capsule (500 mg total) by mouth 3 (three) times daily. 15 capsule 0  . hydrocortisone 2.5 % cream Apply topically 2 (two) times daily. 30 g 0  . predniSONE (STERAPRED UNI-PAK 21 TAB) 10 MG (21) TBPK tablet Taper as directed 21 tablet 0  . olmesartan-hydrochlorothiazide (BENICAR HCT) 40-12.5 MG tablet Take 1 tablet by mouth daily. (Patient taking differently: Take 0.5 tablets by mouth daily. ) 90 tablet 1   No facility-administered medications prior to visit.    ROS Review of Systems  Constitutional: Negative for appetite change, chills, diaphoresis, fatigue and fever.  HENT: Negative.  Negative for sore throat and trouble swallowing.   Eyes: Negative.   Respiratory: Negative for cough, chest tightness, shortness of breath and wheezing.   Cardiovascular: Negative for chest pain, palpitations and leg swelling.  Gastrointestinal: Negative for abdominal pain, constipation, diarrhea, nausea and vomiting.  Endocrine: Positive for polyuria. Negative for polydipsia and polyphagia.  Genitourinary: Positive for frequency. Negative for decreased urine volume, dysuria, flank pain, hematuria and urgency.  Musculoskeletal: Negative for arthralgias and myalgias.  Skin: Positive for rash. Negative for color  change and pallor.       itchy  Neurological: Negative.  Negative for dizziness, weakness, light-headedness and headaches.  Hematological: Negative for adenopathy. Does not bruise/bleed easily.  Psychiatric/Behavioral: Negative for behavioral problems, confusion, dysphoric mood, sleep disturbance and suicidal ideas. The patient is nervous/anxious.     Objective:  BP 118/76   Pulse 76   Temp 97.6 F (36.4 C) (Oral)   Resp 16   Ht 5' 4"  (1.626  m)   Wt 160 lb (72.6 kg)   SpO2 97%   BMI 27.46 kg/m   BP Readings from Last 3 Encounters:  10/15/20 118/76  03/20/20 121/88  01/17/20 126/77    Wt Readings from Last 3 Encounters:  10/15/20 160 lb (72.6 kg)  01/15/20 185 lb (83.9 kg)  01/03/20 188 lb (85.3 kg)    Physical Exam Vitals reviewed.  Constitutional:      Appearance: Normal appearance.  HENT:     Nose: Nose normal.     Mouth/Throat:     Mouth: Mucous membranes are moist.  Eyes:     General: No scleral icterus.    Conjunctiva/sclera: Conjunctivae normal.  Neck:     Thyroid: Thyroid mass (right nodule) present. No thyromegaly or thyroid tenderness.  Cardiovascular:     Rate and Rhythm: Normal rate and regular rhythm.     Pulses: Normal pulses.     Heart sounds: No murmur heard. No friction rub.  Pulmonary:     Effort: Pulmonary effort is normal.     Breath sounds: No stridor. No wheezing, rhonchi or rales.  Abdominal:     General: Abdomen is flat. Bowel sounds are normal. There is no distension.     Palpations: Abdomen is soft. There is no hepatomegaly, splenomegaly or mass.  Musculoskeletal:        General: Normal range of motion.     Cervical back: Normal and normal range of motion.     Thoracic back: Normal.     Lumbar back: No swelling, edema, deformity, signs of trauma, tenderness or bony tenderness. Normal range of motion. Negative right straight leg raise test and negative left straight leg raise test.     Right lower leg: No edema.     Left lower leg: No edema.  Lymphadenopathy:     Cervical: No cervical adenopathy.     Right cervical: No superficial cervical adenopathy.    Left cervical: No superficial cervical adenopathy.  Skin:    General: Skin is warm and dry.     Coloration: Skin is not pale.  Neurological:     General: No focal deficit present.     Mental Status: She is alert and oriented to person, place, and time. Mental status is at baseline.     Motor: No weakness.      Coordination: Coordination normal.     Gait: Gait normal.     Deep Tendon Reflexes: Reflexes normal.  Psychiatric:        Mood and Affect: Mood normal.        Behavior: Behavior normal.        Thought Content: Thought content normal.        Judgment: Judgment normal.     Lab Results  Component Value Date   WBC 7.4 10/15/2020   HGB 12.0 10/15/2020   HCT 35.4 (L) 10/15/2020   PLT 304.0 10/15/2020   GLUCOSE 83 10/15/2020   CHOL 215 (H) 10/15/2020   TRIG 82.0 10/15/2020   HDL 99.20 10/15/2020  LDLDIRECT 114.0 04/29/2017   LDLCALC 100 (H) 10/15/2020   ALT 13 10/15/2020   AST 19 10/15/2020   NA 137 10/15/2020   K 4.1 10/15/2020   CL 99 10/15/2020   CREATININE 1.06 10/15/2020   BUN 14 10/15/2020   CO2 31 10/15/2020   TSH 0.89 10/15/2020   INR 1.16 12/11/2015   HGBA1C 5.7 10/15/2020   MICROALBUR 1.1 03/23/2016    CT ABDOMEN PELVIS W CONTRAST  Result Date: 01/15/2020 CLINICAL DATA:  Abdominal pain and cramping. Concern for bowel obstruction. EXAM: CT ABDOMEN AND PELVIS WITH CONTRAST TECHNIQUE: Multidetector CT imaging of the abdomen and pelvis was performed using the standard protocol following bolus administration of intravenous contrast. CONTRAST:  154m OMNIPAQUE IOHEXOL 300 MG/ML  SOLN COMPARISON:  08/01/2018 FINDINGS: Lower chest: The lung bases are clear. The heart size is normal. Hepatobiliary: The liver is normal. Normal gallbladder.There is no biliary ductal dilation. Pancreas: Normal contours without ductal dilatation. No peripancreatic fluid collection. Spleen: No splenic laceration or hematoma. Adrenals/Urinary Tract: --Adrenal glands: No adrenal hemorrhage. --Right kidney/ureter: No hydronephrosis or perinephric hematoma. --Left kidney/ureter: No hydronephrosis or perinephric hematoma. --Urinary bladder: Unremarkable. Stomach/Bowel: --Stomach/Duodenum: The stomach is moderately distended. --Small bowel: There is evidence for a small bowel obstruction with multiple dilated  loops of small bowel measuring up to approximately 3.2 cm. There appears to be a transition point in the left lower quadrant (axial series 2, image 57). There is a right lower quadrant end ileostomy in place. --Colon: The patient is status post total colectomy. --Appendix: Surgically absent. Vascular/Lymphatic: Atherosclerotic calcification is present within the non-aneurysmal abdominal aorta, without hemodynamically significant stenosis. --No retroperitoneal lymphadenopathy. --No mesenteric lymphadenopathy. --No pelvic or inguinal lymphadenopathy. Reproductive: Status post hysterectomy. No adnexal mass. Other: There is a small amount of free fluid in the patient's abdomen and pelvis, likely reactive. The abdominal wall is normal. Musculoskeletal. No acute displaced fractures. IMPRESSION: 1. Small-bowel obstruction with a transition point in the left lower quadrant. 2. Status post total colectomy. 3. Right lower quadrant and ileostomy without evidence for a complication. 4. Small amount of free fluid in the patient's abdomen and pelvis, likely reactive. Aortic Atherosclerosis (ICD10-I70.0). Electronically Signed   By: CConstance HolsterM.D.   On: 01/15/2020 01:21   DG Abd Portable 1V-Small Bowel Obstruction Protocol-initial, 8 hr delay  Result Date: 01/15/2020 CLINICAL DATA:  8 hour delay for a small-bowel protocol. Contrast given at 04:15 today. EXAM: PORTABLE ABDOMEN - 1 VIEW COMPARISON:  Same day, 0304 hours and CT abdomen pelvis 01/15/2020. FINDINGS: Nasogastric tube terminates in the stomach with the side port in the region of the gastroesophageal junction. Oral contrast is seen in the stomach, duodenum and dilated proximal small bowel. Right lower quadrant ileostomy. No definite colonic oral contrast. Contrast is seen in the bladder. IMPRESSION: 1. Small bowel obstruction, as on CT performed earlier the same day. 2. Nasogastric tube terminates in the stomach with the side port at the gastroesophageal  junction. Electronically Signed   By: MLorin PicketM.D.   On: 01/15/2020 12:30   DG Abd Portable 1V-Small Bowel Protocol-Position Verification  Result Date: 01/15/2020 CLINICAL DATA:  64year old female NG tube placement. Small bowel obstruction. EXAM: PORTABLE ABDOMEN - 1 VIEW COMPARISON:  CT Abdomen and Pelvis earlier today. FINDINGS: Portable AP supine view at 0304 hours. Enteric tube placed with side hole at the level of the gastric fundus. Excreted IV contrast in the renal collecting systems. Negative lung bases. Paucity of bowel gas. Lower lumbar spine degeneration.  IMPRESSION: Enteric tube placed into the stomach with side hole at the level of the gastric fundus. Electronically Signed   By: Genevie Ann M.D.   On: 01/15/2020 03:14   No results found.  Assessment & Plan:   English was seen today for anemia, annual exam and hypertension.  Diagnoses and all orders for this visit:  GAD (generalized anxiety disorder) -     ALPRAZolam (XANAX) 0.25 MG tablet; Take 1 tablet (0.25 mg total) by mouth 2 (two) times daily as needed for anxiety.  Intrinsic eczema -     hydrocortisone 2.5 % cream; Apply topically 2 (two) times daily.  Essential hypertension- Her blood pressure is adequately well controlled. -     Basic metabolic panel; Future -     Basic metabolic panel  Deficiency anemia- Her H&H have improved.  Her vitamin levels are all normal. -     Vitamin B1; Future -     Folate; Future -     Ferritin; Future -     CBC with Differential/Platelet; Future -     Iron; Future -     Vitamin B12; Future -     Vitamin B1 -     Folate -     Ferritin -     CBC with Differential/Platelet -     Iron -     Vitamin B12  Prediabetes- Her blood sugars are adequately well controlled. -     Hemoglobin A1c; Future -     Basic metabolic panel; Future -     Hemoglobin A1c -     Basic metabolic panel  Right thyroid nodule- This is followed [facility]  She is euthyroid. -     TSH;  Future -     TSH  Hyperlipidemia LDL goal <70- Statin therapy is not indicated. -     Lipid panel; Future -     Lipid panel  Chronic bilateral low back pain without sciatica- Based on her symptoms, exam, and plain films she has degenerative disc disease.  Her symptoms are not significant enough to warrant treatment with a medication. -     Hepatic function panel; Future -     Hepatic function panel -     DG Lumbar Spine Complete; Future  Encounter for general adult medical examination with abnormal findings- Exam completed, labs reviewed, vaccines reviewed and updated, cancer screenings are up-to-date, patient education was given.  Urinary frequency- Her urine is negative for infection or glucosuria.  Will treat her for overactive bladder. -     Urinalysis, Routine w reflex microscopic; Future -     CULTURE, URINE COMPREHENSIVE; Future -     Urinalysis, Routine w reflex microscopic -     CULTURE, URINE COMPREHENSIVE  Elevated LFTs- She has protective antibodies against hepatitis A and B. -     Hepatitis A antibody, total; Future -     Hepatitis B core antibody, total; Future -     Hepatitis B surface antibody,quantitative; Future -     Hepatitis A antibody, total -     Hepatitis B core antibody, total -     Hepatitis B surface antibody,quantitative  Estrogen deficiency -     DG Bone Density; Future   I have discontinued Merilee C. Goodin's olmesartan-hydrochlorothiazide, predniSONE, and cephALEXin. I have also changed her ALPRAZolam. Additionally, I am having her start on Gemtesa. Lastly, I am having her maintain her cholecalciferol, multivitamin with minerals, vitamin B-12, loperamide, and hydrocortisone.  Meds  ordered this encounter  Medications  . hydrocortisone 2.5 % cream    Sig: Apply topically 2 (two) times daily.    Dispense:  30 g    Refill:  3  . ALPRAZolam (XANAX) 0.25 MG tablet    Sig: Take 1 tablet (0.25 mg total) by mouth 2 (two) times daily as needed for  anxiety.    Dispense:  60 tablet    Refill:  3  . Vibegron (GEMTESA) 75 MG TABS    Sig: Take 1 tablet by mouth daily.    Dispense:  90 tablet    Refill:  1   In addition to time spent on CPE, I spent 50 minutes in preparing to see the patient by review of recent labs, imaging and procedures, obtaining and reviewing separately obtained history, communicating with the patient and family or caregiver, ordering medications, tests or procedures, and documenting clinical information in the EHR including the differential Dx, treatment, and any further evaluation and other management of 1. GAD (generalized anxiety disorder) 2. Intrinsic eczema 3. Essential hypertension 4. Deficiency anemia 5. Prediabetes 6. Right thyroid nodule 7. Hyperlipidemia LDL goal <70 8. Chronic bilateral low back pain without sciatica 9. Urinary frequency 10. Elevated LFTs 11. Estrogen deficiency 12. OAB (overactive bladder)     Follow-up: Return in about 6 months (around 04/15/2021).  Scarlette Calico, MD

## 2020-10-18 LAB — HEPATITIS B SURFACE ANTIBODY, QUANTITATIVE: Hep B S AB Quant (Post): 12 m[IU]/mL (ref >=10)

## 2020-10-18 LAB — CULTURE, URINE COMPREHENSIVE: RESULT:: NO GROWTH

## 2020-10-18 LAB — HEPATITIS A ANTIBODY, TOTAL: Hepatitis A AB,Total: REACTIVE — AB

## 2020-10-18 LAB — HEPATITIS B CORE ANTIBODY, TOTAL: Hep B Core Total Ab: NONREACTIVE

## 2020-10-18 LAB — VITAMIN B1: Vitamin B1 (Thiamine): 13 nmol/L (ref 8–30)

## 2020-10-20 DIAGNOSIS — N3281 Overactive bladder: Secondary | ICD-10-CM | POA: Insufficient documentation

## 2020-10-20 MED ORDER — GEMTESA 75 MG PO TABS: 1.0000 | ORAL_TABLET | Freq: Every day | ORAL | 1 refills | Status: DC

## 2020-10-20 NOTE — Assessment & Plan Note (Signed)
I will treat this with a beta 3 agonist that does not elevate her blood pressure.

## 2020-10-21 DIAGNOSIS — H35341 Macular cyst, hole, or pseudohole, right eye: Secondary | ICD-10-CM | POA: Diagnosis not present

## 2020-10-22 ENCOUNTER — Encounter: Payer: Self-pay | Admitting: Internal Medicine

## 2020-10-30 DIAGNOSIS — Z20822 Contact with and (suspected) exposure to covid-19: Secondary | ICD-10-CM | POA: Diagnosis not present

## 2020-11-19 ENCOUNTER — Telehealth: Payer: Medicare Other

## 2020-11-19 NOTE — Chronic Care Management (AMB) (Deleted)
Chronic Care Management Pharmacy  Name: IKESHA SILLER  MRN: [id number] DOB: May 10, 1956   Chief Complaint/ HPI  Latasha Wood,  65 y.o. , female presents for their Follow-Up CCM visit with the clinical pharmacist via telephone due to COVID-19 Pandemic.  PCP : Janith Lima, MD Patient Care Team: Janith Lima, MD as PCP - General (Internal Medicine) Lafayette Dragon, MD (Inactive) (Gastroenterology) Brien Few, MD (Obstetrics and Gynecology) Kem Parkinson, MD (Ophthalmology) Leighton Ruff, MD as Consulting Physician (Colon and Rectal Surgery) Charlton Haws, [facility] as Pharmacist (Pharmacist)  Their chronic conditions include: Hypertension, Hyperlipidemia, GERD, Anxiety and Ulcerative colitis s/p colectomy w/ ileostomy  Her and a friend challenged each other to weight loss - she wants to get to 165, hovering around 168. Taking care of her grandchildren (age 67, 52) for a month, daughters are going to [place].   Office Visits: 10/15/20 Dr Ronnald Ramp OV: chronic f/u, stopped olmesartan-HCTZ due to controlled BP. Started Logan Bores for OAB. No statin due to prediabetes and low ASCVD risk.  09/14/19 Dr Ronnald Ramp OV: BP controlled, labs stable, continue ARB-thiazide. Started atorvastatin 10 mg for elevated ASCVD risk.  Consult Visit: 03/20/20 PA Cari Mayers (mobile clinic): pt was previously holding olmesartan-HCTZ due to hypotension. Given hydrocortisone cream for dermatitis  01/14/20 Hospital admission: SBO tx'd NPO, IV fluid, NG tube drainage. Discharged w/ liquid/soft diet for few days and f/u prn. No med changes.  01/03/20 Dr Joya Gaskins (pulmonary): hold olmesartan-HCTZ due to hypotension and concern for dehydration.  10/19/19 Dr Lucia Gaskins (ENT): thyroid nodule f/u. Schedule US-guided aspirate  Allergies  Allergen Reactions  . Betadine [Povidone Iodine]   . Latex Rash    Redness of skin  . Tape Rash    Redness of skin OK with hypofix tape and paper tape! NO transpore tape  ,adhesive tape!    Medications: Outpatient Encounter Medications as of 11/19/2020  Medication Sig  . ALPRAZolam (XANAX) 0.25 MG tablet Take 1 tablet (0.25 mg total) by mouth 2 (two) times daily as needed for anxiety.  . cholecalciferol (VITAMIN D3) 25 MCG (1000 UNIT) tablet Take 1,000 Units by mouth daily.  . hydrocortisone 2.5 % cream Apply topically 2 (two) times daily.  Marland Kitchen loperamide (IMODIUM) 2 MG capsule Take 2 mg by mouth as needed for diarrhea or loose stools.  . Multiple Vitamin (MULTIVITAMIN WITH MINERALS) TABS tablet Take 1 tablet by mouth daily.  . Vibegron (GEMTESA) 75 MG TABS Take 1 tablet by mouth daily.  . vitamin B-12 (CYANOCOBALAMIN) 1000 MCG tablet Take 1,000 mcg by mouth daily.   No facility-administered encounter medications on file as of 11/19/2020.   Lab Results  Component Value Date   CREATININE 1.06 10/15/2020   BUN 14 10/15/2020   GFR 55.09 (L) 10/15/2020   GFRNONAA 63 03/20/2020   GFRAA 73 03/20/2020   NA 137 10/15/2020   K 4.1 10/15/2020   CALCIUM 9.8 10/15/2020   CO2 31 10/15/2020   Wt Readings from Last 3 Encounters:  10/15/20 160 lb (72.6 kg)  01/15/20 185 lb (83.9 kg)  01/03/20 188 lb (85.3 kg)    Current Diagnosis/Assessment:    Goals Addressed   None     Hypertension   BP goal is:  <130/80  Office blood pressures are  BP Readings from Last 3 Encounters:  10/15/20 118/76  03/20/20 121/88  01/17/20 126/77   Patient checks BP at home daily Patient home BP readings are ranging: sometimes low 100s/60  Patient has failed these meds  in the past: olmesartan-Hctz Patient is currently controlled on the following medications:  . No medications  We discussed: pt takes 1/2 tab most days due to issues with hypotension with whole tab previously; she had stopped it for 3-4 weeks, and BP went up to 140/80s, so she restarted it at 1/2 tab  Plan  Continue current medications   Hyperlipidemia   LDL goal < 100 *prescribed statin in  09/2019, pt did not start taking it, she has lost weight ~20lbs since Feb, however LDL has not changed since Nov  Last lipids Lab Results  Component Value Date   CHOL 215 (H) 10/15/2020   HDL 99.20 10/15/2020   LDLCALC 100 (H) 10/15/2020   LDLDIRECT 114.0 04/29/2017   TRIG 82.0 10/15/2020   CHOLHDL 2 10/15/2020   Hepatic Function Latest Ref Rng & Units 10/15/2020 03/06/2020 01/14/2020  Total Protein 6.0 - 8.3 g/dL 6.9 7.0 8.2(H)  Albumin 3.5 - 5.2 g/dL 3.9 4.1 4.1  AST 0 - 37 U/L _0 ALT 0 - 35 U/L _1 Alk Phosphatase 39 - 117 U/L 69 88 90  Total Bilirubin 0.2 - 1.2 mg/dL 0.7 0.5 0.5  Bilirubin, Direct 0.0 - 0.3 mg/dL 0.1 - -    ASCVD risk 6.0% (pt is pre-diabetic)  Patient has failed these meds in past: atorvastatin 10 mg Patient is currently uncontrolled on the following medications:  . No medications  We discussed:  Pt did not start taking atorvastatin when it was prescribed last year; she had ASCVD re-calculated at mobile clinic in May and found she was below the threshold to treat. She worked on wt loss and exercise, but LDL did not change from Nov to May.  Discussed statin benefit/risks at length; as pt is technically below the ASCVD threshold to treat (<7.5%) the likely statin benefit is marginal; discussed newer research about very low LDL and reduced cardiovascular risk; pt will consider risk/benefit of starting a statin and may decide to start  Plan  Continue control with diet and exercise  Anxiety   GAD 7 : Generalized Anxiety Score 04/29/2017  Nervous, Anxious, on Edge 1  Control/stop worrying 0  Worry too much - different things 0  Trouble relaxing 1  Restless 1  Easily annoyed or irritable 0  Afraid - awful might happen 0  Total GAD 7 Score 3   Patient has failed these meds in past: n/a Patient is currently controlled on the following medications:  . Alprazolam 0.25 mg BID prn  We discussed:  Takes very infrequently; one rx usually lasts the  whole year.  Plan  Continue current medications  Health Maintenance   Patient is currently controlled on the following medications:  Marland Kitchen Multivitamin . Hydrocortisone 2.5% cream . Vitamin B12 ODT . Loperamide PRN  We discussed:  Patient is satisfied with current OTC regimen and denies issues  Plan  Continue current medications  Medication Management   Pt uses [facility] for all medications Uses pill box? No - prefers bottle Pt endorses 100% compliance  We discussed: Pt is satisfied with current pharmacy services  Plan  Continue current medication management strategy    Follow up: *** month phone visit  Charlene Brooke, PharmD, BCACP Clinical Pharmacist [facility] [phone]

## 2020-12-09 ENCOUNTER — Telehealth: Payer: Self-pay | Admitting: Pharmacist

## 2020-12-09 NOTE — Progress Notes (Signed)
° ° °  Chronic Care Management Pharmacy Assistant   Name: KIEU QUIGGLE  MRN: [id number] DOB: 02/21/1956  Reason for Encounter: Chart Review   PCP : Janith Lima, MD  Allergies:   Allergies  Allergen Reactions   Betadine [Povidone Iodine]    Latex Rash    Redness of skin   Tape Rash    Redness of skin OK with hypofix tape and paper tape! NO transpore tape ,adhesive tape!    Medications: Outpatient Encounter Medications as of 12/09/2020  Medication Sig   ALPRAZolam (XANAX) 0.25 MG tablet Take 1 tablet (0.25 mg total) by mouth 2 (two) times daily as needed for anxiety.   cholecalciferol (VITAMIN D3) 25 MCG (1000 UNIT) tablet Take 1,000 Units by mouth daily.   hydrocortisone 2.5 % cream Apply topically 2 (two) times daily.   loperamide (IMODIUM) 2 MG capsule Take 2 mg by mouth as needed for diarrhea or loose stools.   Multiple Vitamin (MULTIVITAMIN WITH MINERALS) TABS tablet Take 1 tablet by mouth daily.   Vibegron (GEMTESA) 75 MG TABS Take 1 tablet by mouth daily.   vitamin B-12 (CYANOCOBALAMIN) 1000 MCG tablet Take 1,000 mcg by mouth daily.   No facility-administered encounter medications on file as of 12/09/2020.    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 There were no medications changes made during this visit. Patient is on track with goals.   No gaps in adherence identified. Patient has follow up scheduled with pharmacy team. No further action required.    Wendy Poet, [place] [phone]

## 2021-01-31 ENCOUNTER — Telehealth: Payer: Self-pay | Admitting: Pharmacist

## 2021-01-31 NOTE — Progress Notes (Unsigned)
° ° °  Chronic Care Management Pharmacy Assistant   Name: Latasha Wood  MRN: [id number] DOB: [birth year]  Reason for Encounter: Disease State  General Adherence Call   Recent office visits:  10/15/20- Scarlette Calico, MD   Recent consult visits:  None noted Hospital visits:  None in previous 6 months  Medications: Outpatient Encounter Medications as of 01/31/2021  Medication Sig   ALPRAZolam (XANAX) 0.25 MG tablet Take 1 tablet (0.25 mg total) by mouth 2 (two) times daily as needed for anxiety.   cholecalciferol (VITAMIN D3) 25 MCG (1000 UNIT) tablet Take 1,000 Units by mouth daily.   hydrocortisone 2.5 % cream Apply topically 2 (two) times daily.   loperamide (IMODIUM) 2 MG capsule Take 2 mg by mouth as needed for diarrhea or loose stools.   Multiple Vitamin (MULTIVITAMIN WITH MINERALS) TABS tablet Take 1 tablet by mouth daily.   Vibegron (GEMTESA) 75 MG TABS Take 1 tablet by mouth daily.   vitamin B-12 (CYANOCOBALAMIN) 1000 MCG tablet Take 1,000 mcg by mouth daily.   No facility-administered encounter medications on file as of 01/31/2021.   Have you had any problems recently with your health?  Have you had any problems with your pharmacy?  What issues or side effects are you having with your medications?  What would you like me to pass along to [name],CPP for them to help you with?   What can we do to take care of you better?  [name] ,[place] Pharmacist Assistant [phone]

## 2021-02-13 ENCOUNTER — Telehealth: Payer: Self-pay | Admitting: Pharmacist

## 2021-02-18 NOTE — Progress Notes (Signed)
A general adherence call was made to Latasha Wood on three different occassions, left messages to return call.   [place] Pharmacist Assistant [phone]  Time spent:

## 2021-02-21 ENCOUNTER — Other Ambulatory Visit: Payer: Self-pay | Admitting: Internal Medicine

## 2021-02-21 DIAGNOSIS — Z1231 Encounter for screening mammogram for malignant neoplasm of breast: Secondary | ICD-10-CM

## 2021-03-07 NOTE — Progress Notes (Addendum)
    Chronic Care Management Pharmacy Assistant   Name: Latasha Wood  MRN: [id number] DOB: 1956/08/17   Reason for Encounter: Chart Review    Medications: Outpatient Encounter Medications as of 02/13/2021  Medication Sig   ALPRAZolam (XANAX) 0.25 MG tablet Take 1 tablet (0.25 mg total) by mouth 2 (two) times daily as needed for anxiety.   cholecalciferol (VITAMIN D3) 25 MCG (1000 UNIT) tablet Take 1,000 Units by mouth daily.   hydrocortisone 2.5 % cream Apply topically 2 (two) times daily.   loperamide (IMODIUM) 2 MG capsule Take 2 mg by mouth as needed for diarrhea or loose stools.   Multiple Vitamin (MULTIVITAMIN WITH MINERALS) TABS tablet Take 1 tablet by mouth daily.   Vibegron (GEMTESA) 75 MG TABS Take 1 tablet by mouth daily.   vitamin B-12 (CYANOCOBALAMIN) 1000 MCG tablet Take 1,000 mcg by mouth daily.   No facility-administered encounter medications on file as of 02/13/2021.   Reviewed chart for medication changes and adherence.   No gaps in adherence identified. Patient has follow up scheduled with pharmacy team. No further action required.  [place] Pharmacist Assistant [phone]

## 2021-03-15 DIAGNOSIS — Z20822 Contact with and (suspected) exposure to covid-19: Secondary | ICD-10-CM | POA: Diagnosis not present

## 2021-03-17 DIAGNOSIS — K5190 Ulcerative colitis, unspecified, without complications: Secondary | ICD-10-CM | POA: Diagnosis not present

## 2021-03-17 DIAGNOSIS — T8131XD Disruption of external operation (surgical) wound, not elsewhere classified, subsequent encounter: Secondary | ICD-10-CM | POA: Diagnosis not present

## 2021-03-17 DIAGNOSIS — Z932 Ileostomy status: Secondary | ICD-10-CM | POA: Diagnosis not present

## 2021-03-20 DIAGNOSIS — K5190 Ulcerative colitis, unspecified, without complications: Secondary | ICD-10-CM | POA: Diagnosis not present

## 2021-03-20 DIAGNOSIS — Z932 Ileostomy status: Secondary | ICD-10-CM | POA: Diagnosis not present

## 2021-03-20 DIAGNOSIS — T8131XD Disruption of external operation (surgical) wound, not elsewhere classified, subsequent encounter: Secondary | ICD-10-CM | POA: Diagnosis not present

## 2021-03-24 DIAGNOSIS — T8131XD Disruption of external operation (surgical) wound, not elsewhere classified, subsequent encounter: Secondary | ICD-10-CM | POA: Diagnosis not present

## 2021-03-24 DIAGNOSIS — Z932 Ileostomy status: Secondary | ICD-10-CM | POA: Diagnosis not present

## 2021-03-24 DIAGNOSIS — K5190 Ulcerative colitis, unspecified, without complications: Secondary | ICD-10-CM | POA: Diagnosis not present

## 2021-03-31 ENCOUNTER — Other Ambulatory Visit: Payer: Self-pay | Admitting: Internal Medicine

## 2021-04-02 ENCOUNTER — Other Ambulatory Visit: Payer: Self-pay | Admitting: Internal Medicine

## 2021-04-02 DIAGNOSIS — Z20822 Contact with and (suspected) exposure to covid-19: Secondary | ICD-10-CM | POA: Diagnosis not present

## 2021-04-14 ENCOUNTER — Ambulatory Visit
Admission: RE | Admit: 2021-04-14 | Discharge: 2021-04-14 | Disposition: A | Discharge status: Home / Self Care | Payer: Medicare Other | Source: Ambulatory Visit | Attending: Internal Medicine | Admitting: Internal Medicine

## 2021-04-14 ENCOUNTER — Other Ambulatory Visit: Payer: Self-pay

## 2021-04-14 DIAGNOSIS — Z1231 Encounter for screening mammogram for malignant neoplasm of breast: Secondary | ICD-10-CM

## 2021-04-16 ENCOUNTER — Other Ambulatory Visit: Payer: Self-pay

## 2021-04-16 ENCOUNTER — Encounter: Payer: Self-pay | Admitting: Internal Medicine

## 2021-04-16 ENCOUNTER — Ambulatory Visit (INDEPENDENT_AMBULATORY_CARE_PROVIDER_SITE_OTHER): Payer: Medicare Other | Admitting: Internal Medicine

## 2021-04-16 VITALS — BP 118/76 | HR 78 | Temp 98.3°F | Resp 16 | Ht 64.0 in | Wt 155.0 lb

## 2021-04-16 DIAGNOSIS — B54 Unspecified malaria: Secondary | ICD-10-CM | POA: Diagnosis not present

## 2021-04-16 DIAGNOSIS — E785 Hyperlipidemia, unspecified: Secondary | ICD-10-CM

## 2021-04-16 DIAGNOSIS — I10 Essential (primary) hypertension: Secondary | ICD-10-CM | POA: Diagnosis not present

## 2021-04-16 DIAGNOSIS — N3281 Overactive bladder: Secondary | ICD-10-CM

## 2021-04-16 DIAGNOSIS — D539 Nutritional anemia, unspecified: Secondary | ICD-10-CM

## 2021-04-16 DIAGNOSIS — L2084 Intrinsic (allergic) eczema: Secondary | ICD-10-CM

## 2021-04-16 DIAGNOSIS — R7303 Prediabetes: Secondary | ICD-10-CM | POA: Diagnosis not present

## 2021-04-16 DIAGNOSIS — F411 Generalized anxiety disorder: Secondary | ICD-10-CM | POA: Diagnosis not present

## 2021-04-16 DIAGNOSIS — N1831 Chronic kidney disease, stage 3a: Secondary | ICD-10-CM

## 2021-04-16 LAB — CBC WITH DIFFERENTIAL/PLATELET
Basophils Absolute: 0 10*3/uL (ref 0.0–0.1)
Basophils Relative: 0.5 % (ref 0.0–3.0)
Eosinophils Absolute: 0.2 10*3/uL (ref 0.0–0.7)
Eosinophils Relative: 2.4 % (ref 0.0–5.0)
HCT: 36.4 % (ref 36.0–46.0)
Hemoglobin: 12.2 g/dL (ref 12.0–15.0)
Lymphocytes Relative: 27 % (ref 12.0–46.0)
Lymphs Abs: 2.1 10*3/uL (ref 0.7–4.0)
MCHC: 33.6 g/dL (ref 30.0–36.0)
MCV: 86.4 fl (ref 78.0–100.0)
Monocytes Absolute: 0.5 10*3/uL (ref 0.1–1.0)
Monocytes Relative: 6 % (ref 3.0–12.0)
Neutro Abs: 4.9 10*3/uL (ref 1.4–7.7)
Neutrophils Relative %: 64.1 % (ref 43.0–77.0)
Platelets: 299 10*3/uL (ref 150.0–400.0)
RBC: 4.21 Mil/uL (ref 3.87–5.11)
RDW: 14 % (ref 11.5–15.5)
WBC: 7.7 10*3/uL (ref 4.0–10.5)

## 2021-04-16 LAB — TSH: TSH: 1.14 u[IU]/mL (ref 0.35–4.50)

## 2021-04-16 LAB — LIPID PANEL
Cholesterol: 227 mg/dL — ABNORMAL HIGH (ref 0–200)
HDL: 105.6 mg/dL (ref >39.00)
LDL Cholesterol: 96 mg/dL (ref 0–99)
NonHDL: 120.94
Total CHOL/HDL Ratio: 2
Triglycerides: 125 mg/dL (ref 0.0–149.0)
VLDL: 25 mg/dL (ref 0.0–40.0)

## 2021-04-16 LAB — FERRITIN: Ferritin: 59.6 ng/mL (ref 10.0–291.0)

## 2021-04-16 LAB — BASIC METABOLIC PANEL
BUN: 19 mg/dL (ref 6–23)
CO2: 30 mEq/L (ref 19–32)
Calcium: 9.3 mg/dL (ref 8.4–10.5)
Chloride: 102 mEq/L (ref 96–112)
Creatinine, Ser: 1.06 mg/dL (ref 0.40–1.20)
GFR: 54.9 mL/min — ABNORMAL LOW (ref >60.00)
Glucose, Bld: 86 mg/dL (ref 70–99)
Potassium: 3.9 mEq/L (ref 3.5–5.1)
Sodium: 137 mEq/L (ref 135–145)

## 2021-04-16 LAB — VITAMIN B12: Vitamin B-12: >1550 pg/mL — ABNORMAL HIGH (ref 211–911)

## 2021-04-16 LAB — IRON: Iron: 86 ug/dL (ref 42–145)

## 2021-04-16 LAB — HEMOGLOBIN A1C: Hgb A1c MFr Bld: 5.8 % (ref 4.6–6.5)

## 2021-04-16 LAB — FOLATE: Folate: >24.4 ng/mL (ref >5.9)

## 2021-04-16 MED ORDER — ALPRAZOLAM 0.25 MG PO TABS: 0.2500 mg | ORAL_TABLET | Freq: Two times a day (BID) | ORAL | 3 refills | Status: DC | PRN

## 2021-04-16 MED ORDER — INDAPAMIDE 1.25 MG PO TABS: 1.2500 mg | ORAL_TABLET | Freq: Every day | ORAL | 1 refills | Status: DC

## 2021-04-16 MED ORDER — HYDROCORTISONE 2.5 % EX CREA: TOPICAL_CREAM | Freq: Two times a day (BID) | CUTANEOUS | 3 refills | Status: DC

## 2021-04-16 MED ORDER — GEMTESA 75 MG PO TABS: 1.0000 | ORAL_TABLET | Freq: Every day | ORAL | 1 refills | Status: DC

## 2021-04-16 NOTE — Progress Notes (Signed)
Subjective:  Patient ID: Latasha Wood, female    DOB: 1956/08/21  Age: 65 y.o. MRN: [id number]  CC: Hypertension and Hyperlipidemia  This visit occurred during the SARS-CoV-2 public health emergency.  Safety protocols were in place, including screening questions prior to the visit, additional usage of staff PPE, and extensive cleaning of exam room while observing appropriate contact time as indicated for disinfecting solutions.    HPI Latasha Wood presents for f/up -  She traveled to [place] about 6 months ago.  While she was there she contracted Malaria.  She was treated but she does not know what the treatment was.  She returned about 3 months ago and feels like she still has malaria.  She complains of intermittent chills, night sweats, and a rash.  When I last saw her I felt like her blood pressure was overcontrolled so I asked her to stop taking the combination of the ARB and thiazide diuretic.  She continues to take the combination.  She denies dizziness, lightheadedness, dyspnea on exertion, chest pain, shortness of breath, or edema.  She tells me her blood pressure and blood sugar have been well controlled.  When I last saw her I told her that her vitamin B12 level was too high but she continues to take a B12 supplement.  Outpatient Medications Prior to Visit  Medication Sig Dispense Refill   cholecalciferol (VITAMIN D3) 25 MCG (1000 UNIT) tablet Take 1,000 Units by mouth daily.     loperamide (IMODIUM) 2 MG capsule Take 2 mg by mouth as needed for diarrhea or loose stools.     Multiple Vitamin (MULTIVITAMIN WITH MINERALS) TABS tablet Take 1 tablet by mouth daily.     ALPRAZolam (XANAX) 0.25 MG tablet Take 1 tablet (0.25 mg total) by mouth 2 (two) times daily as needed for anxiety. 60 tablet 3   hydrocortisone 2.5 % cream Apply topically 2 (two) times daily. 30 g 3   olmesartan-hydrochlorothiazide (BENICAR HCT) 40-12.5 MG tablet Take 1 tablet by mouth daily.     Vibegron  (GEMTESA) 75 MG TABS Take 1 tablet by mouth daily. 90 tablet 1   vitamin B-12 (CYANOCOBALAMIN) 1000 MCG tablet Take 1,000 mcg by mouth daily.     No facility-administered medications prior to visit.    ROS Review of Systems  Constitutional:  Positive for chills. Negative for fatigue and fever.  HENT: Negative.    Eyes: Negative.   Respiratory:  Negative for cough, chest tightness, shortness of breath and wheezing.   Cardiovascular:  Negative for chest pain, palpitations and leg swelling.  Gastrointestinal:  Negative for abdominal pain, constipation, diarrhea, nausea and vomiting.  Endocrine: Positive for polyuria.  Genitourinary:  Positive for frequency. Negative for difficulty urinating, flank pain and hematuria.  Musculoskeletal: Negative.   Skin:  Positive for rash. Negative for color change.  Neurological: Negative.  Negative for dizziness, weakness, light-headedness, numbness and headaches.  Hematological:  Negative for adenopathy. Does not bruise/bleed easily.  Psychiatric/Behavioral:  Negative for dysphoric mood. The patient is nervous/anxious.    Objective:  BP 118/76 (BP Location: Left Arm, Patient Position: Sitting, Cuff Size: Large)   Pulse 78   Temp 98.3 F (36.8 C) (Oral)   Resp 16   Ht 5' 4"  (1.626 m)   Wt 155 lb (70.3 kg)   SpO2 99%   BMI 26.61 kg/m   BP Readings from Last 3 Encounters:  04/16/21 118/76  10/15/20 118/76  03/20/20 121/88    Wt Readings from Last  3 Encounters:  04/16/21 155 lb (70.3 kg)  10/15/20 160 lb (72.6 kg)  01/15/20 185 lb (83.9 kg)    Physical Exam Vitals reviewed.  Constitutional:      Appearance: She is not ill-appearing.  HENT:     Nose: Nose normal.     Mouth/Throat:     Mouth: Mucous membranes are moist.  Eyes:     Conjunctiva/sclera: Conjunctivae normal.  Neck:     Thyroid: Thyroid mass and thyromegaly present. No thyroid tenderness.     Comments: Right T nodule - treated at [facility] Cardiovascular:     Rate and  Rhythm: Normal rate and regular rhythm.     Heart sounds: No murmur heard. Pulmonary:     Effort: Pulmonary effort is normal.     Breath sounds: No stridor. No wheezing, rhonchi or rales.  Abdominal:     General: Abdomen is flat.     Palpations: There is no mass.     Tenderness: There is no abdominal tenderness. There is no guarding.     Hernia: No hernia is present.  Musculoskeletal:        General: Normal range of motion.     Cervical back: Neck supple.     Right lower leg: No edema.     Left lower leg: No edema.  Lymphadenopathy:     Cervical: No cervical adenopathy.  Skin:    General: Skin is warm and dry.     Coloration: Skin is not pale.     Findings: Rash present. Rash is papular. Rash is not crusting, macular, nodular, purpuric, pustular, scaling, urticarial or vesicular.  Neurological:     General: No focal deficit present.     Mental Status: She is alert.  Psychiatric:        Mood and Affect: Mood normal.        Behavior: Behavior normal.    Lab Results  Component Value Date   WBC 7.7 04/16/2021   HGB 12.2 04/16/2021   HCT 36.4 04/16/2021   PLT 299.0 04/16/2021   GLUCOSE 86 04/16/2021   CHOL 227 (H) 04/16/2021   TRIG 125.0 04/16/2021   HDL 105.60 04/16/2021   LDLDIRECT 114.0 04/29/2017   LDLCALC 96 04/16/2021   ALT 13 10/15/2020   AST 19 10/15/2020   NA 137 04/16/2021   K 3.9 04/16/2021   CL 102 04/16/2021   CREATININE 1.06 04/16/2021   BUN 19 04/16/2021   CO2 30 04/16/2021   TSH 1.14 04/16/2021   INR 1.16 12/11/2015   HGBA1C 5.8 04/16/2021   MICROALBUR 1.1 03/23/2016    MM 3D SCREEN BREAST BILATERAL  Result Date: 04/15/2021 CLINICAL DATA:  Screening. EXAM: DIGITAL SCREENING BILATERAL MAMMOGRAM WITH TOMOSYNTHESIS AND CAD TECHNIQUE: Bilateral screening digital craniocaudal and mediolateral oblique mammograms were obtained. Bilateral screening digital breast tomosynthesis was performed. The images were evaluated with computer-aided detection.  COMPARISON:  Previous exam(s). ACR Breast Density Category b: There are scattered areas of fibroglandular density. FINDINGS: There are no findings suspicious for malignancy. The images were evaluated with computer-aided detection. IMPRESSION: No mammographic evidence of malignancy. A result letter of this screening mammogram will be mailed directly to the patient. RECOMMENDATION: Screening mammogram in one year. (Code:SM-B-01Y) BI-RADS CATEGORY  1: Negative. Electronically Signed   By: Ammie Ferrier M.D.   On: 04/15/2021 15:45    Assessment & Plan:   Latasha Wood was seen today for hypertension and hyperlipidemia.  Diagnoses and all orders for this visit:  Essential hypertension- Her blood pressure  is somewhat overcontrolled.  I recommended that she stop taking the ARB and to treat her blood pressure with indapamide. -     CBC with Differential/Platelet; Future -     Basic metabolic panel; Future -     TSH; Future -     TSH -     Basic metabolic panel -     CBC with Differential/Platelet -     indapamide (LOZOL) 1.25 MG tablet; Take 1 tablet (1.25 mg total) by mouth daily.  GAD (generalized anxiety disorder) -     ALPRAZolam (XANAX) 0.25 MG tablet; Take 1 tablet (0.25 mg total) by mouth 2 (two) times daily as needed for anxiety. -     TSH; Future -     TSH  Intrinsic eczema -     hydrocortisone 2.5 % cream; Apply topically 2 (two) times daily.  Deficiency anemia- Her H/H are normal now. -     CBC with Differential/Platelet; Future -     Vitamin B12; Future -     Iron; Future -     Ferritin; Future -     Folate; Future -     Vitamin B1; Future -     Vitamin B1 -     Folate -     Ferritin -     Iron -     Vitamin B12 -     CBC with Differential/Platelet  OAB (overactive bladder) -     Vibegron (GEMTESA) 75 MG TABS; Take 1 tablet by mouth daily.  Hyperlipidemia LDL goal <70- Statin therapy is not indicated. -     Lipid panel; Future -     TSH; Future -     TSH -     Lipid  panel  Prediabetes -     Hemoglobin A1c; Future -     Hemoglobin A1c  Malaria -     Ambulatory referral to Infectious Disease  Stage 3a chronic kidney disease ([place])- Her renal function is stable.  She will avoid nephrotoxic agents.  I have discontinued Latasha Wood's vitamin B-12, Gemtesa, olmesartan-hydrochlorothiazide, and Gemtesa. I am also having her start on indapamide and mirabegron ER. Additionally, I am having her maintain her cholecalciferol, multivitamin with minerals, loperamide, ALPRAZolam, and hydrocortisone.  Meds ordered this encounter  Medications   ALPRAZolam (XANAX) 0.25 MG tablet    Sig: Take 1 tablet (0.25 mg total) by mouth 2 (two) times daily as needed for anxiety.    Dispense:  60 tablet    Refill:  3   hydrocortisone 2.5 % cream    Sig: Apply topically 2 (two) times daily.    Dispense:  30 g    Refill:  3   DISCONTD: Vibegron (GEMTESA) 75 MG TABS    Sig: Take 1 tablet by mouth daily.    Dispense:  90 tablet    Refill:  1   indapamide (LOZOL) 1.25 MG tablet    Sig: Take 1 tablet (1.25 mg total) by mouth daily.    Dispense:  90 tablet    Refill:  1   mirabegron ER (MYRBETRIQ) 50 MG TB24 tablet    Sig: Take 1 tablet (50 mg total) by mouth daily.    Dispense:  90 tablet    Refill:  1     Follow-up: Return in about 6 months (around 10/16/2021).  Scarlette Calico, MD

## 2021-04-16 NOTE — Patient Instructions (Signed)

## 2021-04-18 DIAGNOSIS — N1831 Chronic kidney disease, stage 3a: Secondary | ICD-10-CM | POA: Insufficient documentation

## 2021-04-20 MED ORDER — MIRABEGRON ER 50 MG PO TB24: 50.0000 mg | ORAL_TABLET | Freq: Every day | ORAL | 1 refills | Status: DC

## 2021-04-21 LAB — VITAMIN B1: Vitamin B1 (Thiamine): 21 nmol/L (ref 8–30)

## 2021-04-28 ENCOUNTER — Other Ambulatory Visit: Payer: Self-pay

## 2021-04-28 ENCOUNTER — Ambulatory Visit: Payer: Medicare Other | Admitting: Internal Medicine

## 2021-04-28 ENCOUNTER — Encounter: Payer: Self-pay | Admitting: Internal Medicine

## 2021-04-28 VITALS — BP 115/82 | HR 74 | Wt 156.0 lb

## 2021-04-28 DIAGNOSIS — R509 Fever, unspecified: Secondary | ICD-10-CM | POA: Diagnosis not present

## 2021-04-28 DIAGNOSIS — B54 Unspecified malaria: Secondary | ICD-10-CM | POA: Diagnosis not present

## 2021-04-28 NOTE — Patient Instructions (Signed)
Please call clinic to come back sooner if your symptoms come back

## 2021-04-28 NOTE — Progress Notes (Signed)
Patient ID: Latasha Wood, female   DOB: [birth year], 65 y.o.   MRN: [id number]  HPI 65yo F with hx of UC s/p colectomy, last surgery 2017. now with ostomy. Went to [employer], [place] dec 25th - stayed 4 months. But thought initially for covid-19 (since household contact) but it was positive rapid ag test for malaria, microscopy + seen. Treated with Coartem  She was taking malarone - still got malaria -- but you didn't take further proph since it was expensive ( malarone too expensive)  Returned on March 28th but now having periodic chills and nausea and nightsweats  Felt poorly 2 weeks ago, sx are episodic  - went to visit her daughter who was teaching during [place].   Has had 4 doses of covid vaccine, had pret-ravel vaccine  No swimming in waters Drank bottled water  Outpatient Encounter Medications as of 04/28/2021  Medication Sig   ALPRAZolam (XANAX) 0.25 MG tablet Take 1 tablet (0.25 mg total) by mouth 2 (two) times daily as needed for anxiety.   cholecalciferol (VITAMIN D3) 25 MCG (1000 UNIT) tablet Take 1,000 Units by mouth daily.   indapamide (LOZOL) 1.25 MG tablet Take 1 tablet (1.25 mg total) by mouth daily.   Multiple Vitamin (MULTIVITAMIN WITH MINERALS) TABS tablet Take 1 tablet by mouth daily.   hydrocortisone 2.5 % cream Apply topically 2 (two) times daily.   loperamide (IMODIUM) 2 MG capsule Take 2 mg by mouth as needed for diarrhea or loose stools. (Patient not taking: Reported on 04/28/2021)   mirabegron ER (MYRBETRIQ) 50 MG TB24 tablet Take 1 tablet (50 mg total) by mouth daily. (Patient not taking: Reported on 04/28/2021)   No facility-administered encounter medications on file as of 04/28/2021.     Patient Active Problem List   Diagnosis Date Noted   Stage 3a chronic kidney disease ([place]) 04/18/2021   Malaria 04/16/2021   OAB (overactive bladder) 10/20/2020   Deficiency anemia 10/15/2020   Intrinsic eczema 10/15/2020   GAD (generalized anxiety disorder)  10/15/2020   Chronic bilateral low back pain without sciatica 10/15/2020   Encounter for general adult medical examination with abnormal findings 10/15/2020   Urinary frequency 10/15/2020   Estrogen deficiency 10/15/2020   Ileostomy in place [facility]) 01/15/2020   GERD without esophagitis 09/08/2017   Right thyroid nodule 08/17/2016   Arthropathy in ulcerative colitis ([place]) 03/23/2016   Ulcerative colitis ([place]) 09/25/2015   Prediabetes 09/05/2015   Hyperlipidemia LDL goal <70 04/17/2011   Essential hypertension 12/30/2007     Health Maintenance Due  Topic Date Due   URINE MICROALBUMIN  03/23/2017   OPHTHALMOLOGY EXAM  08/13/2017   COLONOSCOPY (Pts 45-15yr Insurance coverage will need to be confirmed)  04/08/2018   FOOT EXAM  09/13/2020   COVID-19 Vaccine (4 - Booster for Pfizer series) 12/17/2020   PNA vac Low Risk Adult (2 of 2 - PPSV23) 03/23/2021    Social History   Tobacco Use   Smoking status: Never   Smokeless tobacco: Never  Substance Use Topics   Alcohol use: Yes    Alcohol/week: 2.0 standard drinks    Types: 2 Standard drinks or equivalent per week    Comment: occ   Drug use: No   family history includes Breast cancer in her maternal aunt; Breast cancer (age of onset: 323 in her sister; Breast cancer (age of onset: 519 in her mother; Colon cancer in her paternal aunt; Diabetes in her brother, father, mother, and sister; Heart disease in her father.  Review of Systems  Constitutional: Negative for fever,but intermittent chills, diaphoresis, and intermitttent decreased activity change, but appetite unchange, fatigue and unexpected weight change.  HENT: Negative for congestion, sore throat, rhinorrhea, sneezing, trouble swallowing and sinus pressure.  Eyes: Negative for photophobia and visual disturbance.  Respiratory: Negative for cough, chest tightness, shortness of breath, wheezing and stridor.  Cardiovascular: Negative for chest pain, palpitations and leg swelling.   Gastrointestinal: Negative for nausea, vomiting, abdominal pain, diarrhea, constipation, blood in stool, abdominal distention and anal bleeding.  Genitourinary: Negative for dysuria, hematuria, flank pain and difficulty urinating.  Musculoskeletal: Negative for myalgias, back pain, joint swelling, arthralgias and gait problem.  Skin: Negative for color change, pallor, rash and wound.  Neurological: Negative for dizziness, tremors, weakness and light-headedness.  Hematological: Negative for adenopathy. Does not bruise/bleed easily.  Psychiatric/Behavioral: Negative for behavioral problems, confusion, sleep disturbance, dysphoric mood, decreased concentration and agitation.   Physical Exam   BP 115/82   Pulse 74   Wt 156 lb (70.8 kg)   BMI 26.78 kg/m   Physical Exam  Constitutional:  oriented to person, place, and time. appears well-developed and well-nourished. No distress.  HENT: /AT, PERRLA, no scleral icterus Mouth/Throat: Oropharynx is clear and moist. No oropharyngeal exudate.  Cardiovascular: Normal rate, regular rhythm and normal heart sounds. Exam reveals no gallop and no friction rub.  No murmur heard.  Pulmonary/Chest: Effort normal and breath sounds normal. No respiratory distress.  has no wheezes.  Neck = supple, no nuchal rigidity Abdominal: Soft. Bowel sounds are normal.  exhibits no distension. There is no tenderness.  Lymphadenopathy: no cervical adenopathy. No axillary adenopathy Neurological: alert and oriented to person, place, and time.  Skin: Skin is warm and dry. No rash noted. No erythema.  Psychiatric: a normal mood and affect.  behavior is normal.   Lab Results  Component Value Date   HEPBSAB NONREACTIVE 04/21/2013   No results found for: RPR, LABRPR  CBC Lab Results  Component Value Date   WBC 7.7 04/16/2021   RBC 4.21 04/16/2021   HGB 12.2 04/16/2021   HCT 36.4 04/16/2021   PLT 299.0 04/16/2021   MCV 86.4 04/16/2021   MCH 28.5 01/17/2020    MCHC 33.6 04/16/2021   RDW 14.0 04/16/2021   LYMPHSABS 2.1 04/16/2021   MONOABS 0.5 04/16/2021   EOSABS 0.2 04/16/2021    BMET Lab Results  Component Value Date   NA 137 04/16/2021   K 3.9 04/16/2021   CL 102 04/16/2021   CO2 30 04/16/2021   GLUCOSE 86 04/16/2021   BUN 19 04/16/2021   CREATININE 1.06 04/16/2021   CALCIUM 9.3 04/16/2021   GFRNONAA 63 03/20/2020   GFRAA 73 03/20/2020      Assessment and Plan Concern that she has other infectious disease from her trip to [place] from dec - through end of march. Previously treated for p.falciparum in January with coartem but did not take any further anti-malarials after there course of treatment. P.falciparum is the majority of malarial strains in [place] roughly 95-98% percent circulating strains. Potentitally <5% is p.malaraie or p.ovale.  Will do initial work up of cbc, bmp, inflammatory markers and also get malaria smears  If she is symptomatic, asked her to come promplty so that can determine further testing needed at time of her symptoms  Currently she is back to her baseline.  Recommend pre-travel counseling again when she returns to [place] later in the year  Spent 83mn discussing malaria and treatment options and prevention  Elzie Rings [facility] [phone]

## 2021-04-30 LAB — MALARIA SMEAR

## 2021-04-30 LAB — SEDIMENTATION RATE: Sed Rate: 17 mm/h (ref 0–30)

## 2021-04-30 LAB — C-REACTIVE PROTEIN: CRP: 1.9 mg/L (ref <8.0)

## 2021-05-22 DIAGNOSIS — E042 Nontoxic multinodular goiter: Secondary | ICD-10-CM | POA: Diagnosis not present

## 2021-05-22 DIAGNOSIS — E041 Nontoxic single thyroid nodule: Secondary | ICD-10-CM | POA: Diagnosis not present

## 2021-05-23 ENCOUNTER — Telehealth: Payer: Self-pay | Admitting: Pharmacist

## 2021-05-23 NOTE — Progress Notes (Signed)
Chronic Care Management Pharmacy Assistant   Name: Latasha Wood  MRN: [id number] DOB: 1956/02/10  Reason for Encounter: Disease State   Conditions to be addressed/monitored: HTN   Recent office visits:  04/16/21 Dr. Ronnald Ramp Internal Medicine (hypertension) d/c olmesartan and cyanocobalamin  Recent consult visits:  04/28/21 Dr. Carlyle Basques Infectious Disease (Malaria)  Hospital visits:  None in previous 6 months  Medications: Outpatient Encounter Medications as of 05/23/2021  Medication Sig   ALPRAZolam (XANAX) 0.25 MG tablet Take 1 tablet (0.25 mg total) by mouth 2 (two) times daily as needed for anxiety.   cholecalciferol (VITAMIN D3) 25 MCG (1000 UNIT) tablet Take 1,000 Units by mouth daily.   hydrocortisone 2.5 % cream Apply topically 2 (two) times daily.   indapamide (LOZOL) 1.25 MG tablet Take 1 tablet (1.25 mg total) by mouth daily.   loperamide (IMODIUM) 2 MG capsule Take 2 mg by mouth as needed for diarrhea or loose stools. (Patient not taking: Reported on 04/28/2021)   mirabegron ER (MYRBETRIQ) 50 MG TB24 tablet Take 1 tablet (50 mg total) by mouth daily. (Patient not taking: Reported on 04/28/2021)   Multiple Vitamin (MULTIVITAMIN WITH MINERALS) TABS tablet Take 1 tablet by mouth daily.   No facility-administered encounter medications on file as of 05/23/2021.    Pharmacist Review  Reviewed chart prior to disease state call. Spoke with patient regarding BP  Recent Office Vitals: BP Readings from Last 3 Encounters:  04/28/21 115/82  04/16/21 118/76  10/15/20 118/76   Pulse Readings from Last 3 Encounters:  04/28/21 74  04/16/21 78  10/15/20 76    Wt Readings from Last 3 Encounters:  04/28/21 156 lb (70.8 kg)  04/16/21 155 lb (70.3 kg)  10/15/20 160 lb (72.6 kg)     Kidney Function Lab Results  Component Value Date/Time   CREATININE 1.06 04/16/2021 10:53 AM   CREATININE 1.06 10/15/2020 10:14 AM   GFR 54.90 (L) 04/16/2021 10:53 AM   GFRNONAA 63  03/20/2020 10:16 AM   GFRAA 73 03/20/2020 10:16 AM    BMP Latest Ref Rng & Units 04/16/2021 10/15/2020 03/20/2020  Glucose 70 - 99 mg/dL 86 83 91  BUN 6 - 23 mg/dL 19 14 13   Creatinine 0.40 - 1.20 mg/dL 1.06 1.06 0.95  BUN/Creat Ratio 12 - 28 - - 14  Sodium 135 - 145 mEq/L 137 137 140  Potassium 3.5 - 5.1 mEq/L 3.9 4.1 4.4  Chloride 96 - 112 mEq/L 102 99 104  CO2 19 - 32 mEq/L 30 31 26   Calcium 8.4 - 10.5 mg/dL 9.3 9.8 9.8    Current antihypertensive regimen:  Indapamide 1.25 mg  How often are you checking your Blood Pressure? daily, patient states she checks several times a day  Current home BP readings: Patient has home readings 04/24/21-113/74        05/06/21-140/79 04/25/21-113/77        05/08/21-140/93 04/27/21-121/79        05/11/21-126/79 04/29/21-126/85        05/22/21-134/91  What recent interventions/DTPs have been made by any provider to improve Blood Pressure control since last CPP Visit: None ID  Any recent hospitalizations or ED visits since last visit with CPP? No  What diet changes have been made to improve Blood Pressure Control?  Patient states that she just got back from vacation and admits her diet was not very good, she has gained some weight  What exercise is being done to improve your Blood Pressure Control?  Patient  states that she does not really exercise but knows she needs to be more active  Adherence Review: Is the patient currently on ACE/ARB medication? No Does the patient have >5 day gap between last estimated fill dates? No   Star Rating Drugs: None ID  Ethelene Hal Clinical Pharmacist Assistant [phone]   Time spent:60

## 2021-05-26 DIAGNOSIS — Z20822 Contact with and (suspected) exposure to covid-19: Secondary | ICD-10-CM | POA: Diagnosis not present

## 2021-06-02 ENCOUNTER — Ambulatory Visit: Payer: Medicare Other | Admitting: Internal Medicine

## 2021-06-27 DIAGNOSIS — Z20822 Contact with and (suspected) exposure to covid-19: Secondary | ICD-10-CM | POA: Diagnosis not present

## 2021-07-28 DIAGNOSIS — Z932 Ileostomy status: Secondary | ICD-10-CM | POA: Diagnosis not present

## 2021-07-28 DIAGNOSIS — K5190 Ulcerative colitis, unspecified, without complications: Secondary | ICD-10-CM | POA: Diagnosis not present

## 2021-07-28 DIAGNOSIS — T8131XD Disruption of external operation (surgical) wound, not elsewhere classified, subsequent encounter: Secondary | ICD-10-CM | POA: Diagnosis not present

## 2021-08-06 ENCOUNTER — Other Ambulatory Visit: Payer: Self-pay

## 2021-08-07 ENCOUNTER — Encounter: Payer: Self-pay | Admitting: Internal Medicine

## 2021-08-07 ENCOUNTER — Ambulatory Visit (INDEPENDENT_AMBULATORY_CARE_PROVIDER_SITE_OTHER): Payer: Medicare Other | Admitting: Internal Medicine

## 2021-08-07 VITALS — BP 112/76 | HR 92 | Temp 98.1°F | Resp 16 | Ht 63.25 in | Wt 157.0 lb

## 2021-08-07 DIAGNOSIS — I959 Hypotension, unspecified: Secondary | ICD-10-CM | POA: Diagnosis not present

## 2021-08-07 DIAGNOSIS — M170 Bilateral primary osteoarthritis of knee: Secondary | ICD-10-CM | POA: Diagnosis not present

## 2021-08-07 DIAGNOSIS — I739 Peripheral vascular disease, unspecified: Secondary | ICD-10-CM | POA: Insufficient documentation

## 2021-08-07 DIAGNOSIS — R0789 Other chest pain: Secondary | ICD-10-CM | POA: Diagnosis not present

## 2021-08-07 DIAGNOSIS — N1831 Chronic kidney disease, stage 3a: Secondary | ICD-10-CM | POA: Diagnosis not present

## 2021-08-07 DIAGNOSIS — I10 Essential (primary) hypertension: Secondary | ICD-10-CM

## 2021-08-07 LAB — BASIC METABOLIC PANEL
BUN: 15 mg/dL (ref 6–23)
CO2: 28 mEq/L (ref 19–32)
Calcium: 9.8 mg/dL (ref 8.4–10.5)
Chloride: 99 mEq/L (ref 96–112)
Creatinine, Ser: 1.14 mg/dL (ref 0.40–1.20)
GFR: 50.2 mL/min — ABNORMAL LOW (ref >60.00)
Glucose, Bld: 75 mg/dL (ref 70–99)
Potassium: 4 mEq/L (ref 3.5–5.1)
Sodium: 136 mEq/L (ref 135–145)

## 2021-08-07 LAB — CBC WITH DIFFERENTIAL/PLATELET
Basophils Absolute: 0 10*3/uL (ref 0.0–0.1)
Basophils Relative: 0.4 % (ref 0.0–3.0)
Eosinophils Absolute: 0.1 10*3/uL (ref 0.0–0.7)
Eosinophils Relative: 1.8 % (ref 0.0–5.0)
HCT: 38.3 % (ref 36.0–46.0)
Hemoglobin: 12.8 g/dL (ref 12.0–15.0)
Lymphocytes Relative: 28.2 % (ref 12.0–46.0)
Lymphs Abs: 2.2 10*3/uL (ref 0.7–4.0)
MCHC: 33.5 g/dL (ref 30.0–36.0)
MCV: 86.6 fl (ref 78.0–100.0)
Monocytes Absolute: 0.4 10*3/uL (ref 0.1–1.0)
Monocytes Relative: 5.4 % (ref 3.0–12.0)
Neutro Abs: 4.9 10*3/uL (ref 1.4–7.7)
Neutrophils Relative %: 64.2 % (ref 43.0–77.0)
Platelets: 295 10*3/uL (ref 150.0–400.0)
RBC: 4.42 Mil/uL (ref 3.87–5.11)
RDW: 13.8 % (ref 11.5–15.5)
WBC: 7.7 10*3/uL (ref 4.0–10.5)

## 2021-08-07 LAB — CORTISOL: Cortisol, Plasma: 7.5 ug/dL

## 2021-08-07 LAB — BRAIN NATRIURETIC PEPTIDE: Pro B Natriuretic peptide (BNP): 14 pg/mL (ref 0.0–100.0)

## 2021-08-07 LAB — TROPONIN I (HIGH SENSITIVITY): High Sens Troponin I: 8 ng/L (ref 2–17)

## 2021-08-07 NOTE — Progress Notes (Signed)
Subjective:  Patient ID: Latasha Wood, female    DOB: [birth year]  Age: 65 y.o. MRN: [id number]  CC: Hypertension and Osteoarthritis  This visit occurred during the SARS-CoV-2 public health emergency.  Safety protocols were in place, including screening questions prior to the visit, additional usage of staff PPE, and extensive cleaning of exam room while observing appropriate contact time as indicated for disinfecting solutions.    HPI Latasha Wood presents for f/up -  She complains of a 2 month hx of dizziness, DOE, chest pressure, and her BP has consistently been < 130/80. She stopped taking the thiazide diuretic but has continued to take left over benicar. She complains of chronic knee pain and calf pain when walking.  Outpatient Medications Prior to Visit  Medication Sig Dispense Refill   ALPRAZolam (XANAX) 0.25 MG tablet Take 1 tablet (0.25 mg total) by mouth 2 (two) times daily as needed for anxiety. 60 tablet 3   cholecalciferol (VITAMIN D3) 25 MCG (1000 UNIT) tablet Take 1,000 Units by mouth daily.     hydrocortisone 2.5 % cream Apply topically 2 (two) times daily. 30 g 3   loperamide (IMODIUM) 2 MG capsule Take 2 mg by mouth as needed for diarrhea or loose stools.     mirabegron ER (MYRBETRIQ) 50 MG TB24 tablet Take 1 tablet (50 mg total) by mouth daily. 90 tablet 1   Multiple Vitamin (MULTIVITAMIN WITH MINERALS) TABS tablet Take 1 tablet by mouth daily.     indapamide (LOZOL) 1.25 MG tablet Take 1 tablet (1.25 mg total) by mouth daily. 90 tablet 1   No facility-administered medications prior to visit.    ROS Review of Systems  Constitutional:  Negative for appetite change, chills, diaphoresis, fatigue, fever and unexpected weight change.  HENT: Negative.    Eyes: Negative.   Respiratory:  Positive for shortness of breath. Negative for cough, chest tightness and wheezing.   Cardiovascular:  Negative for chest pain, palpitations and leg swelling.   Gastrointestinal:  Negative for abdominal pain, constipation, diarrhea, nausea and vomiting.  Genitourinary:  Negative for difficulty urinating, dysuria, hematuria and urgency.  Musculoskeletal:  Positive for arthralgias. Negative for back pain and myalgias.  Skin: Negative.   Neurological:  Positive for dizziness. Negative for weakness, light-headedness and headaches.  Hematological:  Negative for adenopathy. Does not bruise/bleed easily.  Psychiatric/Behavioral: Negative.     Objective:  BP 112/76 (BP Location: Right Arm, Patient Position: Sitting, Cuff Size: Normal)   Pulse 92   Temp 98.1 F (36.7 C) (Oral)   Resp 16   Ht 5' 3.25" (1.607 m)   Wt 157 lb (71.2 kg)   SpO2 97%   BMI 27.59 kg/m   BP Readings from Last 3 Encounters:  08/07/21 112/76  04/28/21 115/82  04/16/21 118/76    Wt Readings from Last 3 Encounters:  08/07/21 157 lb (71.2 kg)  04/28/21 156 lb (70.8 kg)  04/16/21 155 lb (70.3 kg)    Physical Exam Vitals reviewed.  Constitutional:      Appearance: She is not ill-appearing.  HENT:     Nose: Nose normal.     Mouth/Throat:     Mouth: Mucous membranes are moist.  Eyes:     General: No scleral icterus.    Conjunctiva/sclera: Conjunctivae normal.  Cardiovascular:     Pulses:          Dorsalis pedis pulses are 1+ on the right side and 1+ on the left side.  Posterior tibial pulses are 0 on the right side and 0 on the left side.     Heart sounds: Normal heart sounds, S1 normal and S2 normal. No murmur heard.   No gallop.     Comments: EKG- NSR, 78 bpm Normal EKG Pulmonary:     Effort: Pulmonary effort is normal.     Breath sounds: No stridor. No wheezing, rhonchi or rales.  Abdominal:     General: Abdomen is flat.     Palpations: There is no mass.     Tenderness: There is no abdominal tenderness. There is no guarding.     Hernia: No hernia is present.  Musculoskeletal:     Cervical back: Neck supple.     Right lower leg: No edema.     Left  lower leg: No edema.  Feet:     Right foot:     Skin integrity: Skin integrity normal.     Toenail Condition: Right toenails are normal.     Left foot:     Skin integrity: Skin integrity normal.     Toenail Condition: Left toenails are normal.  Skin:    General: Skin is warm and dry.     Coloration: Skin is not pale.  Neurological:     General: No focal deficit present.     Mental Status: She is alert. Mental status is at baseline.  Psychiatric:        Mood and Affect: Mood normal.    Lab Results  Component Value Date   WBC 7.7 08/07/2021   HGB 12.8 08/07/2021   HCT 38.3 08/07/2021   PLT 295.0 08/07/2021   GLUCOSE 75 08/07/2021   CHOL 227 (H) 04/16/2021   TRIG 125.0 04/16/2021   HDL 105.60 04/16/2021   LDLDIRECT 114.0 04/29/2017   LDLCALC 96 04/16/2021   ALT 13 10/15/2020   AST 19 10/15/2020   NA 136 08/07/2021   K 4.0 08/07/2021   CL 99 08/07/2021   CREATININE 1.14 08/07/2021   BUN 15 08/07/2021   CO2 28 08/07/2021   TSH 1.14 04/16/2021   INR 1.16 12/11/2015   HGBA1C 5.8 04/16/2021   MICROALBUR 1.1 03/23/2016    MM 3D SCREEN BREAST BILATERAL  Result Date: 04/15/2021 CLINICAL DATA:  Screening. EXAM: DIGITAL SCREENING BILATERAL MAMMOGRAM WITH TOMOSYNTHESIS AND CAD TECHNIQUE: Bilateral screening digital craniocaudal and mediolateral oblique mammograms were obtained. Bilateral screening digital breast tomosynthesis was performed. The images were evaluated with computer-aided detection. COMPARISON:  Previous exam(s). ACR Breast Density Category b: There are scattered areas of fibroglandular density. FINDINGS: There are no findings suspicious for malignancy. The images were evaluated with computer-aided detection. IMPRESSION: No mammographic evidence of malignancy. A result letter of this screening mammogram will be mailed directly to the patient. RECOMMENDATION: Screening mammogram in one year. (Code:SM-B-01Y) BI-RADS CATEGORY  1: Negative. Electronically Signed   By:  Ammie Ferrier M.D.   On: 04/15/2021 15:45    Assessment & Plan:   Latasha Wood was seen today for hypertension and osteoarthritis.  Diagnoses and all orders for this visit:  Pressure in chest- The work up is reassuring. I do not see any concerns her ischemia. Will order a CT ca+ score. -     Brain natriuretic peptide; Future -     Troponin I (High Sensitivity); Future -     D-dimer, quantitative; Future -     CBC with Differential/Platelet; Future -     Basic metabolic panel; Future -     Cortisol; Future -  Cortisol -     Basic metabolic panel -     CBC with Differential/Platelet -     D-dimer, quantitative -     Troponin I (High Sensitivity) -     Brain natriuretic peptide -     Cancel: EKG 12-Lead -     Cancel: EKG 12-Lead  Hypotension, unspecified hypotension type -     CBC with Differential/Platelet; Future -     Basic metabolic panel; Future -     Cortisol; Future -     Cortisol -     Basic metabolic panel -     CBC with Differential/Platelet  Essential hypertension- Her BP is over-controlled and her renal fxn has declined. I have asked not take an antihypertensive.  Claudication ([place]) -     VAS [place] ABI WITH/WO TBI; Future  Primary osteoarthritis of both knees -     Ambulatory referral to Sports Medicine  Stage 3a chronic kidney disease ([place])- I have asked her to avoid nephrotoxic agents.  I have discontinued Teran C. Chaplin's indapamide. I am also having her maintain her cholecalciferol, multivitamin with minerals, loperamide, ALPRAZolam, hydrocortisone, and mirabegron ER.  No orders of the defined types were placed in this encounter.    Follow-up: Return in about 3 months (around 11/06/2021).  Scarlette Calico, MD

## 2021-08-07 NOTE — Patient Instructions (Signed)

## 2021-08-08 LAB — D-DIMER, QUANTITATIVE: D-Dimer, Quant: 0.58 mcg/mL FEU — ABNORMAL HIGH (ref <0.50)

## 2021-08-11 ENCOUNTER — Encounter: Payer: Self-pay | Admitting: Internal Medicine

## 2021-08-12 ENCOUNTER — Other Ambulatory Visit: Payer: Self-pay | Admitting: Internal Medicine

## 2021-08-12 DIAGNOSIS — N1831 Chronic kidney disease, stage 3a: Secondary | ICD-10-CM

## 2021-08-20 ENCOUNTER — Ambulatory Visit: Payer: Medicare Other | Admitting: Sports Medicine

## 2021-08-21 ENCOUNTER — Telehealth: Payer: Self-pay

## 2021-08-21 ENCOUNTER — Other Ambulatory Visit: Payer: Self-pay

## 2021-08-21 ENCOUNTER — Ambulatory Visit (HOSPITAL_COMMUNITY)
Admission: RE | Admit: 2021-08-21 | Discharge: 2021-08-21 | Disposition: A | Discharge status: Home / Self Care | Payer: Medicare Other | Source: Ambulatory Visit | Attending: Internal Medicine | Admitting: Internal Medicine

## 2021-08-21 DIAGNOSIS — I739 Peripheral vascular disease, unspecified: Secondary | ICD-10-CM | POA: Diagnosis not present

## 2021-08-21 NOTE — Progress Notes (Signed)
    Chronic Care Management Pharmacy Assistant   Name: Latasha Wood  MRN: [id number] DOB: 1956-09-05   Reason for Encounter: Disease State   Conditions to be addressed/monitored: General   Recent office visits:  08/07/21 Janith Lima, MD-PCP (Pressure in chest) labs ordered, med changes:discontinued indapamide  Recent consult visits:  None ID  Hospital visits:  None since last coordination call  Medications: Outpatient Encounter Medications as of 08/21/2021  Medication Sig   ALPRAZolam (XANAX) 0.25 MG tablet Take 1 tablet (0.25 mg total) by mouth 2 (two) times daily as needed for anxiety.   cholecalciferol (VITAMIN D3) 25 MCG (1000 UNIT) tablet Take 1,000 Units by mouth daily.   hydrocortisone 2.5 % cream Apply topically 2 (two) times daily.   loperamide (IMODIUM) 2 MG capsule Take 2 mg by mouth as needed for diarrhea or loose stools.   mirabegron ER (MYRBETRIQ) 50 MG TB24 tablet Take 1 tablet (50 mg total) by mouth daily.   Multiple Vitamin (MULTIVITAMIN WITH MINERALS) TABS tablet Take 1 tablet by mouth daily.   No facility-administered encounter medications on file as of 08/21/2021.   Have you had any problems recently with your health?Patient stated that she was seen recently for sob and chest pressure but she has not had any other issues since then  Have you had any problems with your pharmacy?Patient states that she was taken off of indapamide because of declining kidney function  What issues or side effects are you having with your medications?Patient states she has not had any side effects from any medications  What would you like me to pass along to [facility] for them to help you with? Patient states that she does not have any concerns about her health or medications at this time  What can we do to take care of you better? Patient states not at this time  Care Gaps: Colonoscopy-04/09/15 Diabetic Foot  Exam-09/14/19 Mammogram-04/14/21 Ophthalmology-08/13/16 Dexa Scan - NA Annual Well Visit - NA Micro albumin-NA Hemoglobin A1c- 04/16/21  Star Rating Drugs: None ID  Ethelene Hal Clinical Pharmacist Assistant [phone]

## 2021-08-22 ENCOUNTER — Ambulatory Visit: Payer: Self-pay

## 2021-08-22 ENCOUNTER — Ambulatory Visit: Payer: Medicare Other | Admitting: Sports Medicine

## 2021-08-22 ENCOUNTER — Ambulatory Visit (INDEPENDENT_AMBULATORY_CARE_PROVIDER_SITE_OTHER): Payer: Medicare Other

## 2021-08-22 VITALS — BP 142/100 | HR 75 | Ht 63.25 in | Wt 163.8 lb

## 2021-08-22 DIAGNOSIS — M1711 Unilateral primary osteoarthritis, right knee: Secondary | ICD-10-CM | POA: Diagnosis not present

## 2021-08-22 DIAGNOSIS — M25562 Pain in left knee: Secondary | ICD-10-CM | POA: Diagnosis not present

## 2021-08-22 DIAGNOSIS — G8929 Other chronic pain: Secondary | ICD-10-CM

## 2021-08-22 DIAGNOSIS — M170 Bilateral primary osteoarthritis of knee: Secondary | ICD-10-CM

## 2021-08-22 DIAGNOSIS — M1712 Unilateral primary osteoarthritis, left knee: Secondary | ICD-10-CM | POA: Diagnosis not present

## 2021-08-22 DIAGNOSIS — M25561 Pain in right knee: Secondary | ICD-10-CM | POA: Diagnosis not present

## 2021-08-22 NOTE — Progress Notes (Signed)
Benito Mccreedy D.Rea Anselmo [place] Phone: [phone]   Assessment and Plan:     1. Chronic pain of both knees 2. Primary osteoarthritis of both knees --Acute flare of chronic disease, initial sports medicine visit - Likely acute flare of bilateral osteoarthritis based on HPI, physical exam, x-rays - Patient elected for bilateral CSI.  Tolerated well per procedure note below - Continue physical activity.  Recommended elliptical, stationary bike, or water aerobics - Start physical therapy for bilateral knee OA.  Referral sent - Tylenol as needed for pain control.  May take up to 3000 mg daily - X-rays obtained in clinic.  My interpretation: No acute fracture, bilaterally decreased joint space with cortical irregularities consistent with tricompartmental osteoarthritis worse in right knee compared to left worse than right patellofemoral compartment - DG Knee AP/LAT W/Sunrise Right; Future - DG Knee AP/LAT W/Sunrise Left; Future   Procedure: Knee Joint Injection Side: Bilateral Indication: Acute flare of bilateral knee OA  Risks explained and consent was given verbally. The site was cleaned with alcohol prep. A needle was introduced with an anterio-lateral approach. Injection given using 13m of 1% lidocaine without epinephrine and 120mof kenalog 4077ml. This was well tolerated and resulted in symptomatic relief.  Needle was removed, hemostasis achieved, and post injection instructions were explained.  Procedure was repeated on contralateral side in same fashion.  Pt was advised to call or return to clinic if these symptoms worsen or fail to improve as anticipated.  Pertinent previous records reviewed include PCP note   Follow Up: 6-week follow-up for reevaluation.  Could consider HA injections at that time if no improvement or worsening of symptoms   Subjective:   I, BriJudy Pimplem serving as a scribe for Dr.  BenGlennon Machief Complaint: bilateral knee pain   HPI:   08/22/21 Patient is a 65 74ar old female presenting with bilateral knee pain worse when walking. Patient has had pain in both knees for several years but in the last year it has been worse. Patient locates pain to front on knee cap and the back of her knee feels like a constant aching worse when laying. Patient is a walker and once she gets going the pain will eventually subside. Stopped the meloxicam since she found out her GFR has dropped.   Radiates: yes down to the shins  Swelling: sometimes Mechanical symptoms: yes  Numbness/tingling: tingling pain that starts lateral R leg above the knee down the leg  Weakness: rarely, but in the right leg  Aggravates: sitting to standing, going up and down steps  Treatments tried: brace, meloxicam, tylenol,    Relevant Historical Information: Hypertension, ulcerative colitis with history of resection  Additional pertinent review of systems negative.   Current Outpatient Medications:    ALPRAZolam (XANAX) 0.25 MG tablet, Take 1 tablet (0.25 mg total) by mouth 2 (two) times daily as needed for anxiety., Disp: 60 tablet, Rfl: 3   cholecalciferol (VITAMIN D3) 25 MCG (1000 UNIT) tablet, Take 1,000 Units by mouth daily., Disp: , Rfl:    hydrocortisone 2.5 % cream, Apply topically 2 (two) times daily., Disp: 30 g, Rfl: 3   loperamide (IMODIUM) 2 MG capsule, Take 2 mg by mouth as needed for diarrhea or loose stools., Disp: , Rfl:    mirabegron ER (MYRBETRIQ) 50 MG TB24 tablet, Take 1 tablet (50 mg total) by mouth daily., Disp: 90 tablet, Rfl: 1   Multiple Vitamin (MULTIVITAMIN  WITH MINERALS) TABS tablet, Take 1 tablet by mouth daily., Disp: , Rfl:    Objective:     Vitals:   08/22/21 1343  BP: (!) 142/100  Pulse: 75  SpO2: 99%  Weight: 163 lb 12.8 oz (74.3 kg)  Height: 5' 3.25" (1.607 m)      Body mass index is 28.79 kg/m.    Physical Exam:    General:  awake, alert  oriented, no acute distress nontoxic Skin: no suspicious lesions or rashes Neuro:sensation intact, no deficits, strength 5/5 with no deficits, no atrophy, normal muscle tone Psych: No signs of anxiety, depression or other mood disorder  Bilateral knee: No swelling No deformity Neg fluid wave, joint milking Bilateral crepitus, worse on right compared to left Bilateral ROM Flex 100, Ext 10 TTP medial joint line, lateral joint line NTTP over the quad tendon, medial fem condyle, lat fem condyle, patella, plica, patella tendon, tibial tuberostiy, fibular head, posterior fossa, pes anserine bursa, gerdy's tubercle Neg anterior and posterior drawer Neg lachman Neg sag sign Negative varus stress Negative valgus stress Negative McMurray Negative Thessaly  Gait normal    Electronically signed by:  Benito Mccreedy D.[facility] 2:34 PM 08/22/21

## 2021-08-22 NOTE — Patient Instructions (Addendum)
Good to see you  Injections given today Tylenol as needed for pain control.  May take up to 3000 mg daily Continue physical activity.  Recommended elliptical, stationary bike, or water aerobics Referral for physical therapy placed See me again in 6 weeks

## 2021-08-22 NOTE — Addendum Note (Signed)
Addended by: Hinda Kehr on: 08/22/2021 11:11 AM   Modules accepted: Orders

## 2021-09-24 ENCOUNTER — Other Ambulatory Visit: Payer: Self-pay | Admitting: Internal Medicine

## 2021-09-24 ENCOUNTER — Telehealth: Payer: Self-pay | Admitting: Internal Medicine

## 2021-09-24 DIAGNOSIS — I10 Essential (primary) hypertension: Secondary | ICD-10-CM

## 2021-09-24 MED ORDER — AMLODIPINE BESYLATE 5 MG PO TABS: 5.0000 mg | ORAL_TABLET | Freq: Every day | ORAL | 0 refills | Status: DC

## 2021-09-24 NOTE — Telephone Encounter (Signed)
Patient states she was taking off of her bp medication by provider  Patient states her bp reading for today was 163/97  Patient is requesting a call back to discuss alternative bp medications that can be prescribed by provider

## 2021-09-25 ENCOUNTER — Encounter: Payer: Self-pay | Admitting: Internal Medicine

## 2021-10-08 ENCOUNTER — Encounter: Payer: Self-pay | Admitting: Internal Medicine

## 2021-10-10 DIAGNOSIS — N1831 Chronic kidney disease, stage 3a: Secondary | ICD-10-CM | POA: Diagnosis not present

## 2021-10-10 DIAGNOSIS — I129 Hypertensive chronic kidney disease with stage 1 through stage 4 chronic kidney disease, or unspecified chronic kidney disease: Secondary | ICD-10-CM | POA: Diagnosis not present

## 2021-10-10 DIAGNOSIS — R7989 Other specified abnormal findings of blood chemistry: Secondary | ICD-10-CM | POA: Diagnosis not present

## 2021-10-14 ENCOUNTER — Telehealth: Payer: Self-pay

## 2021-10-14 NOTE — Chronic Care Management (AMB) (Signed)
    Chronic Care Management Pharmacy Assistant   Name: AMARYA KUEHL  MRN: [id number] DOB: 05-09-1956    Reason for Encounter: Disease State-General Assessment    Recent office visits:  None ID   Recent consult visits:  None ID  Hospital visits:  None since last coordination call  Medications: Outpatient Encounter Medications as of 10/14/2021  Medication Sig   ALPRAZolam (XANAX) 0.25 MG tablet Take 1 tablet (0.25 mg total) by mouth 2 (two) times daily as needed for anxiety.   amLODipine (NORVASC) 5 MG tablet Take 1 tablet (5 mg total) by mouth daily.   cholecalciferol (VITAMIN D3) 25 MCG (1000 UNIT) tablet Take 1,000 Units by mouth daily.   hydrocortisone 2.5 % cream Apply topically 2 (two) times daily.   loperamide (IMODIUM) 2 MG capsule Take 2 mg by mouth as needed for diarrhea or loose stools.   mirabegron ER (MYRBETRIQ) 50 MG TB24 tablet Take 1 tablet (50 mg total) by mouth daily.   Multiple Vitamin (MULTIVITAMIN WITH MINERALS) TABS tablet Take 1 tablet by mouth daily.   No facility-administered encounter medications on file as of 10/14/2021.   Reviewed chart for medication changes and drug therapy problems ahead of medication adherence call.  Attempted to contact patient x 3 for medication review and health check, unable to reach patient, left voicemails to return call.    Care Gaps: Colonoscopy-04/09/15 Diabetic Foot Exam-09/14/19 Mammogram-04/14/21 Ophthalmology-08/13/16 Dexa Scan - NA Annual Well Visit - NA Micro albumin-NA Hemoglobin A1c- 04/16/21  Star Rating Drugs: None ID  Ethelene Hal Clinical Pharmacist Assistant [phone]

## 2021-10-15 ENCOUNTER — Ambulatory Visit: Payer: Medicare Other | Admitting: Emergency Medicine

## 2021-10-23 DIAGNOSIS — I129 Hypertensive chronic kidney disease with stage 1 through stage 4 chronic kidney disease, or unspecified chronic kidney disease: Secondary | ICD-10-CM | POA: Diagnosis not present

## 2021-11-20 DIAGNOSIS — I129 Hypertensive chronic kidney disease with stage 1 through stage 4 chronic kidney disease, or unspecified chronic kidney disease: Secondary | ICD-10-CM | POA: Diagnosis not present

## 2021-11-20 DIAGNOSIS — N1831 Chronic kidney disease, stage 3a: Secondary | ICD-10-CM | POA: Diagnosis not present

## 2021-11-26 ENCOUNTER — Ambulatory Visit: Payer: Medicare Other | Admitting: Internal Medicine

## 2021-11-27 ENCOUNTER — Telehealth: Payer: Medicare Other

## 2021-12-30 ENCOUNTER — Encounter: Payer: Self-pay | Admitting: Internal Medicine

## 2021-12-30 ENCOUNTER — Other Ambulatory Visit: Payer: Self-pay

## 2021-12-30 ENCOUNTER — Ambulatory Visit (INDEPENDENT_AMBULATORY_CARE_PROVIDER_SITE_OTHER): Payer: Medicare Other | Admitting: Internal Medicine

## 2021-12-30 VITALS — BP 144/86 | HR 70 | Temp 98.3°F | Resp 16 | Ht 63.5 in | Wt 174.0 lb

## 2021-12-30 DIAGNOSIS — E785 Hyperlipidemia, unspecified: Secondary | ICD-10-CM

## 2021-12-30 DIAGNOSIS — N1831 Chronic kidney disease, stage 3a: Secondary | ICD-10-CM | POA: Diagnosis not present

## 2021-12-30 DIAGNOSIS — I10 Essential (primary) hypertension: Secondary | ICD-10-CM

## 2021-12-30 DIAGNOSIS — Z0001 Encounter for general adult medical examination with abnormal findings: Secondary | ICD-10-CM | POA: Diagnosis not present

## 2021-12-30 DIAGNOSIS — F411 Generalized anxiety disorder: Secondary | ICD-10-CM

## 2021-12-30 DIAGNOSIS — R7303 Prediabetes: Secondary | ICD-10-CM

## 2021-12-30 DIAGNOSIS — Z23 Encounter for immunization: Secondary | ICD-10-CM | POA: Diagnosis not present

## 2021-12-30 LAB — BASIC METABOLIC PANEL
BUN: 14 mg/dL (ref 6–23)
CO2: 30 mEq/L (ref 19–32)
Calcium: 9.4 mg/dL (ref 8.4–10.5)
Chloride: 102 mEq/L (ref 96–112)
Creatinine, Ser: 1.16 mg/dL (ref 0.40–1.20)
GFR: 49.03 mL/min — ABNORMAL LOW (ref >60.00)
Glucose, Bld: 89 mg/dL (ref 70–99)
Potassium: 4.1 mEq/L (ref 3.5–5.1)
Sodium: 137 mEq/L (ref 135–145)

## 2021-12-30 LAB — LIPID PANEL
Cholesterol: 241 mg/dL — ABNORMAL HIGH (ref 0–200)
HDL: 97.3 mg/dL (ref >39.00)
LDL Cholesterol: 115 mg/dL — ABNORMAL HIGH (ref 0–99)
NonHDL: 143.78
Total CHOL/HDL Ratio: 2
Triglycerides: 143 mg/dL (ref 0.0–149.0)
VLDL: 28.6 mg/dL (ref 0.0–40.0)

## 2021-12-30 LAB — HEMOGLOBIN A1C: Hgb A1c MFr Bld: 5.9 % (ref 4.6–6.5)

## 2021-12-30 MED ORDER — INDAPAMIDE 1.25 MG PO TABS
1.2500 mg | ORAL_TABLET | Freq: Every day | ORAL | 0 refills | Status: DC
Start: 2021-12-30 — End: 2022-03-27

## 2021-12-30 MED ORDER — ALPRAZOLAM 0.25 MG PO TABS: 0.2500 mg | ORAL_TABLET | Freq: Two times a day (BID) | ORAL | 3 refills | Status: DC | PRN

## 2021-12-30 NOTE — Patient Instructions (Signed)

## 2021-12-30 NOTE — Progress Notes (Signed)
Subjective:  Patient ID: Latasha Wood, female    DOB: 12-29-1955  Age: 66 y.o. MRN: [id number]  CC: Annual Exam and Hypertension  This visit occurred during the SARS-CoV-2 public health emergency.  Safety protocols were in place, including screening questions prior to the visit, additional usage of staff PPE, and extensive cleaning of exam room while observing appropriate contact time as indicated for disinfecting solutions.    HPI Latasha Wood presents for a CPX and f/up -   She complains that her blood pressure is not adequately well controlled.  She recently saw her kidney doctor and is taking 25 mg of losartan QD.  She tried to increase the dose of amlodipine to 10 mg but it caused leg edema.  She also informs me that about once a week she takes her husband's supply of meloxicam.  Based on her blood pressure log it looks like the blood pressure elevations may be related to meloxicam.  She has had a few blood pressures in the last 2 weeks that were above 140/90.  She has chronic, unchanged shortness of breath.  Outpatient Medications Prior to Visit  Medication Sig Dispense Refill   cholecalciferol (VITAMIN D3) 25 MCG (1000 UNIT) tablet Take 1,000 Units by mouth daily.     hydrocortisone 2.5 % cream Apply topically 2 (two) times daily. 30 g 3   loperamide (IMODIUM) 2 MG capsule Take 2 mg by mouth as needed for diarrhea or loose stools.     losartan (COZAAR) 25 MG tablet Take 25 mg by mouth at bedtime.     Multiple Vitamin (MULTIVITAMIN WITH MINERALS) TABS tablet Take 1 tablet by mouth daily.     ALPRAZolam (XANAX) 0.25 MG tablet Take 1 tablet (0.25 mg total) by mouth 2 (two) times daily as needed for anxiety. 60 tablet 3   amLODipine (NORVASC) 5 MG tablet Take 1 tablet (5 mg total) by mouth daily. 90 tablet 0   mirabegron ER (MYRBETRIQ) 50 MG TB24 tablet Take 1 tablet (50 mg total) by mouth daily. 90 tablet 1   No facility-administered medications prior to visit.     ROS Review of Systems  Constitutional:  Positive for unexpected weight change (wt gain). Negative for chills, diaphoresis and fatigue.  HENT: Negative.    Eyes:  Negative for visual disturbance.  Respiratory:  Positive for shortness of breath. Negative for cough and chest tightness.   Cardiovascular:  Negative for chest pain, palpitations and leg swelling.  Gastrointestinal:  Negative for abdominal pain, constipation, diarrhea, nausea and vomiting.  Endocrine: Negative.   Genitourinary: Negative.  Negative for decreased urine volume, difficulty urinating, dysuria, hematuria and urgency.  Musculoskeletal:  Positive for arthralgias. Negative for back pain and myalgias.  Skin: Negative.  Negative for color change.  Neurological:  Positive for headaches. Negative for dizziness, weakness and light-headedness.  Hematological:  Negative for adenopathy. Does not bruise/bleed easily.  Psychiatric/Behavioral:  Negative for decreased concentration, dysphoric mood, sleep disturbance and suicidal ideas. The patient is nervous/anxious.    Objective:  BP (!) 144/86 (BP Location: Left Arm, Patient Position: Sitting, Cuff Size: Large)    Pulse 70    Temp 98.3 F (36.8 C) (Oral)    Resp 16    Ht 5' 3.5" (1.613 m)    Wt 174 lb (78.9 kg)    SpO2 99%    BMI 30.34 kg/m   BP Readings from Last 3 Encounters:  12/30/21 (!) 144/86  08/22/21 (!) 142/100  08/07/21 112/76  Wt Readings from Last 3 Encounters:  12/30/21 174 lb (78.9 kg)  08/22/21 163 lb 12.8 oz (74.3 kg)  08/07/21 157 lb (71.2 kg)    Physical Exam Vitals reviewed.  Constitutional:      Appearance: Normal appearance. She is not ill-appearing.  HENT:     Nose: Nose normal.     Mouth/Throat:     Mouth: Mucous membranes are moist.  Eyes:     General: No scleral icterus.    Conjunctiva/sclera: Conjunctivae normal.  Cardiovascular:     Rate and Rhythm: Normal rate and regular rhythm.     Heart sounds: No murmur heard. Pulmonary:      Effort: Pulmonary effort is normal.     Breath sounds: No stridor. No wheezing, rhonchi or rales.  Abdominal:     General: Abdomen is flat.     Palpations: There is no mass.     Tenderness: There is no abdominal tenderness. There is no guarding.     Hernia: No hernia is present.  Musculoskeletal:        General: No swelling or deformity. Normal range of motion.     Cervical back: Neck supple.     Right lower leg: No edema.     Left lower leg: No edema.  Lymphadenopathy:     Cervical: No cervical adenopathy.  Skin:    General: Skin is warm and dry.  Neurological:     General: No focal deficit present.     Mental Status: She is alert. Mental status is at baseline.  Psychiatric:        Mood and Affect: Mood normal.        Behavior: Behavior normal.    Lab Results  Component Value Date   WBC 7.7 08/07/2021   HGB 12.8 08/07/2021   HCT 38.3 08/07/2021   PLT 295.0 08/07/2021   GLUCOSE 89 12/30/2021   CHOL 241 (H) 12/30/2021   TRIG 143.0 12/30/2021   HDL 97.30 12/30/2021   LDLDIRECT 114.0 04/29/2017   LDLCALC 115 (H) 12/30/2021   ALT 13 10/15/2020   AST 19 10/15/2020   NA 137 12/30/2021   K 4.1 12/30/2021   CL 102 12/30/2021   CREATININE 1.16 12/30/2021   BUN 14 12/30/2021   CO2 30 12/30/2021   TSH 1.14 04/16/2021   INR 1.16 12/11/2015   HGBA1C 5.9 12/30/2021   MICROALBUR 1.1 03/23/2016    VAS [place] ABI WITH/WO TBI  Result Date: 08/21/2021  LOWER EXTREMITY DOPPLER STUDY Patient Name:  Latasha Wood  Date of Exam:   08/21/2021 Medical Rec #: [id number]            Accession #:    [id number] Date of Birth: 1956/10/23             Patient Gender: F Patient Age:   20 years Exam Location:  [facility] Procedure:      VAS [place] ABI WITH/WO TBI Referring Phys: Scarlette Calico --------------------------------------------------------------------------------  Indications: Claudication. High Risk Factors: Hypertension, hyperlipidemia, no history of smoking.  Comparison  Study: No prior exam Performing Technologist: Alvia Grove RVT  Examination Guidelines: A complete evaluation includes at minimum, Doppler waveform signals and systolic blood pressure reading at the level of bilateral brachial, anterior tibial, and posterior tibial arteries, when vessel segments are accessible. Bilateral testing is considered an integral part of a complete examination. Photoelectric Plethysmograph (PPG) waveforms and toe systolic pressure readings are included as required and additional duplex testing as needed. Limited examinations for  reoccurring indications may be performed as noted.  ABI Findings: +---------+------------------+-----+---------+--------+  Right     Rt Pressure (mmHg) Index Waveform  Comment   +---------+------------------+-----+---------+--------+  Brachial  152                                          +---------+------------------+-----+---------+--------+  PTA       171                1.06  triphasic           +---------+------------------+-----+---------+--------+  DP        186                1.15  triphasic           +---------+------------------+-----+---------+--------+  Great Toe 139                0.86  Normal              +---------+------------------+-----+---------+--------+ +---------+------------------+-----+---------+-------+  Left      Lt Pressure (mmHg) Index Waveform  Comment  +---------+------------------+-----+---------+-------+  Brachial  162                                         +---------+------------------+-----+---------+-------+  PTA       164                1.01  triphasic          +---------+------------------+-----+---------+-------+  DP        169                1.04  triphasic          +---------+------------------+-----+---------+-------+  Great Toe 126                0.78  Normal             +---------+------------------+-----+---------+-------+ +-------+-----------+-----------+------------+------------+  ABI/TBI Today's ABI Today's TBI Previous  ABI Previous TBI  +-------+-----------+-----------+------------+------------+  Right   1.15        0.86                                   +-------+-----------+-----------+------------+------------+  Left    1.04        0.78                                   +-------+-----------+-----------+------------+------------+  Summary: Right: Resting right ankle-brachial index is within normal range. No evidence of significant right lower extremity arterial disease. The right toe-brachial index is normal. Left: Resting left ankle-brachial index is within normal range. No evidence of significant left lower extremity arterial disease. The left toe-brachial index is normal.  *See table(s) above for measurements and observations.  Electronically signed by Orlie Pollen on 08/21/2021 at 3:16:15 PM.    Final    [place] LIMITED JOINT SPACE STRUCTURES LOW LEFT(NO LINKED CHARGES)  Result Date: 08/26/2021 No Ultrasound performed   DG Knee AP/LAT W/Sunrise Left  Result Date: 08/25/2021 CLINICAL DATA:  Knee pain. EXAM: LEFT KNEE 3 VIEWS COMPARISON:  None. FINDINGS: There is no evidence for acute fracture or dislocation. There is mild lateral compartment  joint space narrowing. There are mild osteophytes in the medial, lateral patellofemoral compartments. No focal osseous lesions are identified. There is no significant joint effusion. Soft tissues are within normal limits. IMPRESSION: 1. No acute bony abnormality. 2. Mild tricompartmental osteoarthrosis. Electronically Signed   By: Ronney Asters M.D.   On: 08/25/2021 22:10   DG Knee AP/LAT W/Sunrise Right  Result Date: 08/25/2021 CLINICAL DATA:  Knee pain. EXAM: RIGHT KNEE 3 VIEWS COMPARISON:  None. FINDINGS: There is no significant joint effusion. There is tricompartmental osteophyte formation with mild patellofemoral and lateral compartment joint space narrowing. There is no acute fracture. No focal osseous lesion identified. Bony mineralization is within normal limits.  IMPRESSION: 1. No acute bony abnormality. 2. Moderate tricompartmental osteoarthrosis. Electronically Signed   By: Ronney Asters M.D.   On: 08/25/2021 22:06    Assessment & Plan:   Noelle was seen today for annual exam and hypertension.  Diagnoses and all orders for this visit:  Essential hypertension- Her blood pressure is not adequately well controlled.  Will add a thiazide diuretic. -     Basic metabolic panel; Future -     indapamide (LOZOL) 1.25 MG tablet; Take 1 tablet (1.25 mg total) by mouth daily. -     Basic metabolic panel  Stage 3a chronic kidney disease ([place])- Her renal function has improved.  Will try to get better control of her blood pressure. -     Basic metabolic panel; Future -     Basic metabolic panel  Hyperlipidemia LDL goal <70- Her ASCVD risk score is 29% so I recommended that she take a statin for CV risk reduction. -     Lipid panel; Future -     Lipid panel  Encounter for general adult medical examination with abnormal findings- Exam completed, labs reviewed, vaccines reviewed and updated, cancer screenings are up-to-date, patient education was given.  Prediabetes- Her A1c is 5.9%.  Medical therapy is not indicated. -     Hemoglobin A1c; Future -     Basic metabolic panel; Future -     Basic metabolic panel -     Hemoglobin A1c  GAD (generalized anxiety disorder) -     ALPRAZolam (XANAX) 0.25 MG tablet; Take 1 tablet (0.25 mg total) by mouth 2 (two) times daily as needed for anxiety.  Other orders -     Pneumococcal polysaccharide vaccine 23-valent greater than or equal to 2yo subcutaneous/IM   I have discontinued Kimala C. Presswood's mirabegron ER and amLODipine. I am also having her start on indapamide. Additionally, I am having her maintain her cholecalciferol, multivitamin with minerals, loperamide, hydrocortisone, losartan, and ALPRAZolam.  Meds ordered this encounter  Medications   ALPRAZolam (XANAX) 0.25 MG tablet    Sig: Take 1 tablet  (0.25 mg total) by mouth 2 (two) times daily as needed for anxiety.    Dispense:  60 tablet    Refill:  3   indapamide (LOZOL) 1.25 MG tablet    Sig: Take 1 tablet (1.25 mg total) by mouth daily.    Dispense:  90 tablet    Refill:  0     Follow-up: Return in about 3 months (around 03/29/2022).  Scarlette Calico, MD

## 2022-01-06 MED ORDER — ROSUVASTATIN CALCIUM 10 MG PO TABS: 10.0000 mg | ORAL_TABLET | Freq: Every day | ORAL | 1 refills | Status: DC

## 2022-01-26 DIAGNOSIS — Z20822 Contact with and (suspected) exposure to covid-19: Secondary | ICD-10-CM | POA: Diagnosis not present

## 2022-03-10 DIAGNOSIS — K5190 Ulcerative colitis, unspecified, without complications: Secondary | ICD-10-CM | POA: Diagnosis not present

## 2022-03-10 DIAGNOSIS — T8131XD Disruption of external operation (surgical) wound, not elsewhere classified, subsequent encounter: Secondary | ICD-10-CM | POA: Diagnosis not present

## 2022-03-10 DIAGNOSIS — Z932 Ileostomy status: Secondary | ICD-10-CM | POA: Diagnosis not present

## 2022-03-18 ENCOUNTER — Ambulatory Visit: Payer: Medicare Other | Admitting: Internal Medicine

## 2022-03-23 DIAGNOSIS — I129 Hypertensive chronic kidney disease with stage 1 through stage 4 chronic kidney disease, or unspecified chronic kidney disease: Secondary | ICD-10-CM | POA: Diagnosis not present

## 2022-03-23 DIAGNOSIS — R7989 Other specified abnormal findings of blood chemistry: Secondary | ICD-10-CM | POA: Diagnosis not present

## 2022-03-27 ENCOUNTER — Other Ambulatory Visit: Payer: Self-pay | Admitting: Internal Medicine

## 2022-03-27 DIAGNOSIS — I10 Essential (primary) hypertension: Secondary | ICD-10-CM

## 2022-04-01 ENCOUNTER — Ambulatory Visit: Payer: Medicare Other | Admitting: Internal Medicine

## 2022-04-15 ENCOUNTER — Ambulatory Visit: Payer: Medicare Other | Admitting: Sports Medicine

## 2022-04-20 NOTE — Progress Notes (Signed)
Latasha Wood.[place] Phone: [phone]   Assessment and Plan:     1. Chronic pain of both knees 2. Primary osteoarthritis of both knees -Chronic with exacerbation, subsequent visit - Recurrence of bilateral knee pain with patient receiving significant relief after prior bilateral intra-articular knee CSI on 08/22/2021.  Patient elected for repeat CSI today.  Tolerated well per note below  Procedure: Knee Joint Injection Side: Bilateral Indication: Acute flare of osteoarthritis  Risks explained and consent was given verbally. The site was cleaned with alcohol prep. A needle was introduced with an anterio-lateral approach. Injection given using 68m of 1% lidocaine without epinephrine and 149mof kenalog 4035ml. This was well tolerated and resulted in symptomatic relief.  Needle was removed, hemostasis achieved, and post injection instructions were explained.  Procedure was repeated on contralateral side.  pt was advised to call or return to clinic if these symptoms worsen or fail to improve as anticipated.    Pertinent previous records reviewed include none   Follow Up: 3 months for repeat CSI.  Patient can follow-up sooner if she has pain flare at which point we could get prior authorization and try HA injections   Subjective:   I, Latasha Wood, am serving as a scrEducation administratorr Doctor BenGlennon Machief Complaint: bilateral knee pain    HPI:    08/22/21 Patient is a [age]ar old female presenting with bilateral knee pain worse when walking. Patient has had pain in both knees for several years but in the last year it has been worse. Patient locates pain to front on knee cap and the back of her knee feels like a constant aching worse when laying. Patient is a walker and once she gets going the pain will eventually subside. Stopped the meloxicam since she found out her GFR has dropped.    Radiates: yes  down to the shins  Swelling: sometimes Mechanical symptoms: yes  Numbness/tingling: tingling pain that starts lateral R leg above the knee down the leg  Weakness: rarely, but in the right leg  Aggravates: sitting to standing, going up and down steps  Treatments tried: brace, meloxicam, tylenol,    04/21/2022 Patient states that her knee are hurting real bad wants a CSI    Relevant Historical Information: Hypertension, ulcerative colitis with history of resection   Additional pertinent review of systems negative.   Current Outpatient Medications:    ALPRAZolam (XANAX) 0.25 MG tablet, Take 1 tablet (0.25 mg total) by mouth 2 (two) times daily as needed for anxiety., Disp: 60 tablet, Rfl: 3   cholecalciferol (VITAMIN D3) 25 MCG (1000 UNIT) tablet, Take 1,000 Units by mouth daily., Disp: , Rfl:    hydrocortisone 2.5 % cream, Apply topically 2 (two) times daily., Disp: 30 g, Rfl: 3   indapamide (LOZOL) 1.25 MG tablet, TAKE 1 TABLET BY MOUTH DAILY., Disp: 90 tablet, Rfl: 0   loperamide (IMODIUM) 2 MG capsule, Take 2 mg by mouth as needed for diarrhea or loose stools., Disp: , Rfl:    losartan (COZAAR) 25 MG tablet, Take 25 mg by mouth at bedtime., Disp: , Rfl:    Multiple Vitamin (MULTIVITAMIN WITH MINERALS) TABS tablet, Take 1 tablet by mouth daily., Disp: , Rfl:    rosuvastatin (CRESTOR) 10 MG tablet, Take 1 tablet (10 mg total) by mouth daily., Disp: 90 tablet, Rfl: 1   Objective:     Vitals:   04/21/22 1137  BP: 130/76  Pulse: 85  SpO2: 99%  Weight: 177 lb (80.3 kg)  Height: 5' 3"  (1.6 m)      Body mass index is 31.35 kg/m.    Physical Exam:    General:  awake, alert oriented, no acute distress nontoxic Skin: no suspicious lesions or rashes Neuro:sensation intact, no deficits, strength 5/5 with no deficits, no atrophy, normal muscle tone Psych: No signs of anxiety, depression or other mood disorder   Bilateral knee: No swelling No deformity Neg fluid wave, joint  milking Bilateral crepitus, worse on right compared to left Bilateral ROM Flex 100, Ext 10 TTP medial joint line, lateral joint line NTTP over the quad tendon, medial fem condyle, lat fem condyle, patella, plica, patella tendon, tibial tuberostiy, fibular head, posterior fossa, pes anserine bursa, gerdy's tubercle Neg anterior and posterior drawer Neg lachman Neg sag sign Negative varus stress Negative valgus stress Negative McMurray Negative Thessaly   Gait normal      Electronically signed by:  Latasha Wood.[facility] 11:53 AM 04/21/22

## 2022-04-21 ENCOUNTER — Ambulatory Visit: Payer: Medicare Other | Admitting: Sports Medicine

## 2022-04-21 VITALS — BP 130/76 | HR 85 | Ht 63.0 in | Wt 177.0 lb

## 2022-04-21 DIAGNOSIS — M170 Bilateral primary osteoarthritis of knee: Secondary | ICD-10-CM | POA: Diagnosis not present

## 2022-04-21 DIAGNOSIS — G8929 Other chronic pain: Secondary | ICD-10-CM | POA: Diagnosis not present

## 2022-04-21 DIAGNOSIS — M25562 Pain in left knee: Secondary | ICD-10-CM | POA: Diagnosis not present

## 2022-04-21 DIAGNOSIS — M25561 Pain in right knee: Secondary | ICD-10-CM | POA: Diagnosis not present

## 2022-04-21 NOTE — Patient Instructions (Addendum)
Good to see you  3 month follow up can call before then and ask for HA injections if pain flares

## 2022-04-30 ENCOUNTER — Ambulatory Visit (INDEPENDENT_AMBULATORY_CARE_PROVIDER_SITE_OTHER): Payer: Medicare Other | Admitting: Internal Medicine

## 2022-04-30 ENCOUNTER — Encounter: Payer: Self-pay | Admitting: Internal Medicine

## 2022-04-30 VITALS — BP 138/84 | HR 68 | Temp 98.3°F | Resp 16 | Ht 63.0 in | Wt 174.0 lb

## 2022-04-30 DIAGNOSIS — E785 Hyperlipidemia, unspecified: Secondary | ICD-10-CM | POA: Diagnosis not present

## 2022-04-30 DIAGNOSIS — F411 Generalized anxiety disorder: Secondary | ICD-10-CM

## 2022-04-30 DIAGNOSIS — K51019 Ulcerative (chronic) pancolitis with unspecified complications: Secondary | ICD-10-CM

## 2022-04-30 DIAGNOSIS — T502X5A Adverse effect of carbonic-anhydrase inhibitors, benzothiadiazides and other diuretics, initial encounter: Secondary | ICD-10-CM | POA: Diagnosis not present

## 2022-04-30 DIAGNOSIS — E2839 Other primary ovarian failure: Secondary | ICD-10-CM

## 2022-04-30 DIAGNOSIS — E876 Hypokalemia: Secondary | ICD-10-CM

## 2022-04-30 DIAGNOSIS — R7989 Other specified abnormal findings of blood chemistry: Secondary | ICD-10-CM | POA: Diagnosis not present

## 2022-04-30 DIAGNOSIS — N1831 Chronic kidney disease, stage 3a: Secondary | ICD-10-CM

## 2022-04-30 DIAGNOSIS — Z1231 Encounter for screening mammogram for malignant neoplasm of breast: Secondary | ICD-10-CM

## 2022-04-30 DIAGNOSIS — I10 Essential (primary) hypertension: Secondary | ICD-10-CM

## 2022-04-30 LAB — LIPID PANEL
Cholesterol: 161 mg/dL (ref 0–200)
HDL: 101.8 mg/dL (ref >39.00)
LDL Cholesterol: 43 mg/dL (ref 0–99)
NonHDL: 59.01
Total CHOL/HDL Ratio: 2
Triglycerides: 82 mg/dL (ref 0.0–149.0)
VLDL: 16.4 mg/dL (ref 0.0–40.0)

## 2022-04-30 LAB — URINALYSIS, ROUTINE W REFLEX MICROSCOPIC
Bilirubin Urine: NEGATIVE
Hgb urine dipstick: NEGATIVE
Ketones, ur: NEGATIVE
Leukocytes,Ua: NEGATIVE
Nitrite: NEGATIVE
RBC / HPF: NONE SEEN (ref 0–?)
Specific Gravity, Urine: <=1.005 — AB (ref 1.000–1.030)
Total Protein, Urine: NEGATIVE
Urine Glucose: NEGATIVE
Urobilinogen, UA: 0.2 (ref 0.0–1.0)
WBC, UA: NONE SEEN (ref 0–?)
pH: 6 (ref 5.0–8.0)

## 2022-04-30 LAB — HEPATIC FUNCTION PANEL
ALT: 50 U/L — ABNORMAL HIGH (ref 0–35)
AST: 39 U/L — ABNORMAL HIGH (ref 0–37)
Albumin: 4 g/dL (ref 3.5–5.2)
Alkaline Phosphatase: 70 U/L (ref 39–117)
Bilirubin, Direct: 0.2 mg/dL (ref 0.0–0.3)
Total Bilirubin: 0.7 mg/dL (ref 0.2–1.2)
Total Protein: 7.4 g/dL (ref 6.0–8.3)

## 2022-04-30 LAB — BASIC METABOLIC PANEL
BUN: 16 mg/dL (ref 6–23)
CO2: 33 mEq/L — ABNORMAL HIGH (ref 19–32)
Calcium: 9.3 mg/dL (ref 8.4–10.5)
Chloride: 98 mEq/L (ref 96–112)
Creatinine, Ser: 1.01 mg/dL (ref 0.40–1.20)
GFR: 57.75 mL/min — ABNORMAL LOW (ref >60.00)
Glucose, Bld: 99 mg/dL (ref 70–99)
Potassium: 3.2 mEq/L — ABNORMAL LOW (ref 3.5–5.1)
Sodium: 137 mEq/L (ref 135–145)

## 2022-04-30 MED ORDER — ALPRAZOLAM 0.25 MG PO TABS: 0.2500 mg | ORAL_TABLET | Freq: Two times a day (BID) | ORAL | 3 refills | Status: DC | PRN

## 2022-04-30 MED ORDER — POTASSIUM CHLORIDE CRYS ER 15 MEQ PO TBCR: 15.0000 meq | EXTENDED_RELEASE_TABLET | Freq: Two times a day (BID) | ORAL | 0 refills | Status: DC

## 2022-04-30 NOTE — Patient Instructions (Signed)
Hypertension, Adult High blood pressure (hypertension) is when the force of blood pumping through the arteries is too strong. The arteries are the blood vessels that carry blood from the heart throughout the body. Hypertension forces the heart to work harder to pump blood and may cause arteries to become narrow or stiff. Untreated or uncontrolled hypertension can lead to a heart attack, heart failure, a stroke, kidney disease, and other problems. A blood pressure reading consists of a higher number over a lower number. Ideally, your blood pressure should be below 120/80. The first ("top") number is called the systolic pressure. It is a measure of the pressure in your arteries as your heart beats. The second ("bottom") number is called the diastolic pressure. It is a measure of the pressure in your arteries as the heart relaxes. What are the causes? The exact cause of this condition is not known. There are some conditions that result in high blood pressure. What increases the risk? Certain factors may make you more likely to develop high blood pressure. Some of these risk factors are under your control, including: Smoking. Not getting enough exercise or physical activity. Being overweight. Having too much fat, sugar, calories, or salt (sodium) in your diet. Drinking too much alcohol. Other risk factors include: Having a personal history of heart disease, diabetes, high cholesterol, or kidney disease. Stress. Having a family history of high blood pressure and high cholesterol. Having obstructive sleep apnea. Age. The risk increases with age. What are the signs or symptoms? High blood pressure may not cause symptoms. Very high blood pressure (hypertensive crisis) may cause: Headache. Fast or irregular heartbeats (palpitations). Shortness of breath. Nosebleed. Nausea and vomiting. Vision changes. Severe chest pain, dizziness, and seizures. How is this diagnosed? This condition is diagnosed by  measuring your blood pressure while you are seated, with your arm resting on a flat surface, your legs uncrossed, and your feet flat on the floor. The cuff of the blood pressure monitor will be placed directly against the skin of your upper arm at the level of your heart. Blood pressure should be measured at least twice using the same arm. Certain conditions can cause a difference in blood pressure between your right and left arms. If you have a high blood pressure reading during one visit or you have normal blood pressure with other risk factors, you may be asked to: Return on a different day to have your blood pressure checked again. Monitor your blood pressure at home for 1 week or longer. If you are diagnosed with hypertension, you may have other blood or imaging tests to help your health care provider understand your overall risk for other conditions. How is this treated? This condition is treated by making healthy lifestyle changes, such as eating healthy foods, exercising more, and reducing your alcohol intake. You may be referred for counseling on a healthy diet and physical activity. Your health care provider may prescribe medicine if lifestyle changes are not enough to get your blood pressure under control and if: Your systolic blood pressure is above 130. Your diastolic blood pressure is above 80. Your personal target blood pressure may vary depending on your medical conditions, your age, and other factors. Follow these instructions at home: Eating and drinking  Eat a diet that is high in fiber and potassium, and low in sodium, added sugar, and fat. An example of this eating plan is called the DASH diet. DASH stands for Dietary Approaches to Stop Hypertension. To eat this way: Eat   plenty of fresh fruits and vegetables. Try to fill one half of your plate at each meal with fruits and vegetables. Eat whole grains, such as whole-wheat pasta, brown rice, or whole-grain bread. Fill about one  fourth of your plate with whole grains. Eat or drink low-fat dairy products, such as skim milk or low-fat yogurt. Avoid fatty cuts of meat, processed or cured meats, and poultry with skin. Fill about one fourth of your plate with lean proteins, such as fish, chicken without skin, beans, eggs, or tofu. Avoid pre-made and processed foods. These tend to be higher in sodium, added sugar, and fat. Reduce your daily sodium intake. Many people with hypertension should eat less than 1,500 mg of sodium a day. Do not drink alcohol if: Your health care provider tells you not to drink. You are pregnant, may be pregnant, or are planning to become pregnant. If you drink alcohol: Limit how much you have to: 0-1 drink a day for women. 0-2 drinks a day for men. Know how much alcohol is in your drink. In the [place] one drink equals one 12 oz bottle of beer (355 mL), one 5 oz glass of wine (148 mL), or one 1 oz glass of hard liquor (44 mL). Lifestyle  Work with your health care provider to maintain a healthy body weight or to lose weight. Ask what an ideal weight is for you. Get at least 30 minutes of exercise that causes your heart to beat faster (aerobic exercise) most days of the week. Activities may include walking, swimming, or biking. Include exercise to strengthen your muscles (resistance exercise), such as Pilates or lifting weights, as part of your weekly exercise routine. Try to do these types of exercises for 30 minutes at least 3 days a week. Do not use any products that contain nicotine or tobacco. These products include cigarettes, chewing tobacco, and vaping devices, such as e-cigarettes. If you need help quitting, ask your health care provider. Monitor your blood pressure at home as told by your health care provider. Keep all follow-up visits. This is important. Medicines Take over-the-counter and prescription medicines only as told by your health care provider. Follow directions carefully. Blood  pressure medicines must be taken as prescribed. Do not skip doses of blood pressure medicine. Doing this puts you at risk for problems and can make the medicine less effective. Ask your health care provider about side effects or reactions to medicines that you should watch for. Contact a health care provider if you: Think you are having a reaction to a medicine you are taking. Have headaches that keep coming back (recurring). Feel dizzy. Have swelling in your ankles. Have trouble with your vision. Get help right away if you: Develop a severe headache or confusion. Have unusual weakness or numbness. Feel faint. Have severe pain in your chest or abdomen. Vomit repeatedly. Have trouble breathing. These symptoms may be an emergency. Get help right away. Call 911. Do not wait to see if the symptoms will go away. Do not drive yourself to the hospital. Summary Hypertension is when the force of blood pumping through your arteries is too strong. If this condition is not controlled, it may put you at risk for serious complications. Your personal target blood pressure may vary depending on your medical conditions, your age, and other factors. For most people, a normal blood pressure is less than 120/80. Hypertension is treated with lifestyle changes, medicines, or a combination of both. Lifestyle changes include losing weight, eating a healthy,   low-sodium diet, exercising more, and limiting alcohol. This information is not intended to replace advice given to you by your health care provider. Make sure you discuss any questions you have with your health care provider. Document Revised: 09/02/2021 Document Reviewed: 09/02/2021 Elsevier Patient Education  2023 [employer]  

## 2022-04-30 NOTE — Progress Notes (Unsigned)
Subjective:  Patient ID: Latasha Wood, female    DOB: [birth year]  Age: 66 y.o. MRN: [id number]  CC: No chief complaint on file.   HPI Latasha Wood presents for ***  Outpatient Medications Prior to Visit  Medication Sig Dispense Refill   cholecalciferol (VITAMIN D3) 25 MCG (1000 UNIT) tablet Take 1,000 Units by mouth daily.     hydrocortisone 2.5 % cream Apply topically 2 (two) times daily. 30 g 3   indapamide (LOZOL) 1.25 MG tablet TAKE 1 TABLET BY MOUTH DAILY. 90 tablet 0   loperamide (IMODIUM) 2 MG capsule Take 2 mg by mouth as needed for diarrhea or loose stools.     losartan (COZAAR) 25 MG tablet Take 25 mg by mouth at bedtime.     Multiple Vitamin (MULTIVITAMIN WITH MINERALS) TABS tablet Take 1 tablet by mouth daily.     rosuvastatin (CRESTOR) 10 MG tablet Take 1 tablet (10 mg total) by mouth daily. 90 tablet 1   ALPRAZolam (XANAX) 0.25 MG tablet Take 1 tablet (0.25 mg total) by mouth 2 (two) times daily as needed for anxiety. 60 tablet 3   No facility-administered medications prior to visit.    ROS Review of Systems  Objective:  BP 138/84 (BP Location: Left Arm, Patient Position: Sitting, Cuff Size: Large)   Pulse 68   Temp 98.3 F (36.8 C) (Oral)   Resp 16   Ht 5' 3"  (1.6 m)   Wt 174 lb (78.9 kg)   SpO2 97%   BMI 30.82 kg/m   BP Readings from Last 3 Encounters:  04/30/22 138/84  04/21/22 130/76  12/30/21 (!) 144/86    Wt Readings from Last 3 Encounters:  04/30/22 174 lb (78.9 kg)  04/21/22 177 lb (80.3 kg)  12/30/21 174 lb (78.9 kg)    Physical Exam  Lab Results  Component Value Date   WBC 7.7 08/07/2021   HGB 12.8 08/07/2021   HCT 38.3 08/07/2021   PLT 295.0 08/07/2021   GLUCOSE 99 04/30/2022   CHOL 161 04/30/2022   TRIG 82.0 04/30/2022   HDL 101.80 04/30/2022   LDLDIRECT 114.0 04/29/2017   LDLCALC 43 04/30/2022   ALT 50 (H) 04/30/2022   AST 39 (H) 04/30/2022   NA 137 04/30/2022   K 3.2 (L) 04/30/2022   CL 98 04/30/2022    CREATININE 1.01 04/30/2022   BUN 16 04/30/2022   CO2 33 (H) 04/30/2022   TSH 1.14 04/16/2021   INR 1.16 12/11/2015   HGBA1C 5.9 12/30/2021   MICROALBUR 1.1 03/23/2016    [place] LIMITED JOINT SPACE STRUCTURES LOW LEFT(NO LINKED CHARGES)  Result Date: 08/26/2021 No Ultrasound performed   DG Knee AP/LAT W/Sunrise Left  Result Date: 08/25/2021 CLINICAL DATA:  Knee pain. EXAM: LEFT KNEE 3 VIEWS COMPARISON:  None. FINDINGS: There is no evidence for acute fracture or dislocation. There is mild lateral compartment joint space narrowing. There are mild osteophytes in the medial, lateral patellofemoral compartments. No focal osseous lesions are identified. There is no significant joint effusion. Soft tissues are within normal limits. IMPRESSION: 1. No acute bony abnormality. 2. Mild tricompartmental osteoarthrosis. Electronically Signed   By: Ronney Asters M.D.   On: 08/25/2021 22:10   DG Knee AP/LAT W/Sunrise Right  Result Date: 08/25/2021 CLINICAL DATA:  Knee pain. EXAM: RIGHT KNEE 3 VIEWS COMPARISON:  None. FINDINGS: There is no significant joint effusion. There is tricompartmental osteophyte formation with mild patellofemoral and lateral compartment joint space narrowing. There is no acute fracture. No  focal osseous lesion identified. Bony mineralization is within normal limits. IMPRESSION: 1. No acute bony abnormality. 2. Moderate tricompartmental osteoarthrosis. Electronically Signed   By: Ronney Asters M.D.   On: 08/25/2021 22:06   VAS [place] ABI WITH/WO TBI  Result Date: 08/21/2021  LOWER EXTREMITY DOPPLER STUDY Patient Name:  Latasha Wood  Date of Exam:   08/21/2021 Medical Rec #: [id number]            Accession #:    [id number] Date of Birth: 02-06-1956             Patient Gender: F Patient Age:   68 years Exam Location:  [facility] Procedure:      VAS [place] ABI WITH/WO TBI Referring Phys: Scarlette Calico  --------------------------------------------------------------------------------  Indications: Claudication. High Risk Factors: Hypertension, hyperlipidemia, no history of smoking.  Comparison Study: No prior exam Performing Technologist: Alvia Grove RVT  Examination Guidelines: A complete evaluation includes at minimum, Doppler waveform signals and systolic blood pressure reading at the level of bilateral brachial, anterior tibial, and posterior tibial arteries, when vessel segments are accessible. Bilateral testing is considered an integral part of a complete examination. Photoelectric Plethysmograph (PPG) waveforms and toe systolic pressure readings are included as required and additional duplex testing as needed. Limited examinations for reoccurring indications may be performed as noted.  ABI Findings: +---------+------------------+-----+---------+--------+ Right    Rt Pressure (mmHg)IndexWaveform Comment  +---------+------------------+-----+---------+--------+ Brachial 152                                      +---------+------------------+-----+---------+--------+ PTA      171               1.06 triphasic         +---------+------------------+-----+---------+--------+ DP       186               1.15 triphasic         +---------+------------------+-----+---------+--------+ Great Toe139               0.86 Normal            +---------+------------------+-----+---------+--------+ +---------+------------------+-----+---------+-------+ Left     Lt Pressure (mmHg)IndexWaveform Comment +---------+------------------+-----+---------+-------+ Brachial 162                                     +---------+------------------+-----+---------+-------+ PTA      164               1.01 triphasic        +---------+------------------+-----+---------+-------+ DP       169               1.04 triphasic        +---------+------------------+-----+---------+-------+ Great Toe126                0.78 Normal           +---------+------------------+-----+---------+-------+ +-------+-----------+-----------+------------+------------+ ABI/TBIToday's ABIToday's TBIPrevious ABIPrevious TBI +-------+-----------+-----------+------------+------------+ Right  1.15       0.86                                +-------+-----------+-----------+------------+------------+ Left   1.04       0.78                                +-------+-----------+-----------+------------+------------+  Summary: Right: Resting right ankle-brachial index is within normal range. No evidence of significant right lower extremity arterial disease. The right toe-brachial index is normal. Left: Resting left ankle-brachial index is within normal range. No evidence of significant left lower extremity arterial disease. The left toe-brachial index is normal.  *See table(s) above for measurements and observations.  Electronically signed by Orlie Pollen on 08/21/2021 at 3:16:15 PM.    Final     Assessment & Plan:   Diagnoses and all orders for this visit:  Essential hypertension -     Basic metabolic panel; Future -     Hepatic function panel; Future -     Hepatic function panel -     Basic metabolic panel  GAD (generalized anxiety disorder) -     ALPRAZolam (XANAX) 0.25 MG tablet; Take 1 tablet (0.25 mg total) by mouth 2 (two) times daily as needed for anxiety.  Ulcerative pancolitis with complication (HCC) -     Hepatic function panel; Future -     Hepatic function panel  Stage 3a chronic kidney disease (HCC) -     Basic metabolic panel; Future -     Urinalysis, Routine w reflex microscopic; Future -     Urinalysis, Routine w reflex microscopic -     Basic metabolic panel  Hyperlipidemia LDL goal <70 -     Lipid panel; Future -     Lipid panel   I am having Latasha Wood maintain her cholecalciferol, multivitamin with minerals, loperamide, hydrocortisone, losartan, rosuvastatin,  indapamide, and ALPRAZolam.  Meds ordered this encounter  Medications   ALPRAZolam (XANAX) 0.25 MG tablet    Sig: Take 1 tablet (0.25 mg total) by mouth 2 (two) times daily as needed for anxiety.    Dispense:  60 tablet    Refill:  3     Follow-up: Return in about 6 months (around 10/30/2022).  Scarlette Calico, MD

## 2022-05-05 DIAGNOSIS — R7989 Other specified abnormal findings of blood chemistry: Secondary | ICD-10-CM | POA: Insufficient documentation

## 2022-05-05 DIAGNOSIS — Z1231 Encounter for screening mammogram for malignant neoplasm of breast: Secondary | ICD-10-CM | POA: Insufficient documentation

## 2022-05-06 ENCOUNTER — Ambulatory Visit
Admission: RE | Admit: 2022-05-06 | Discharge: 2022-05-06 | Disposition: A | Discharge status: Home / Self Care | Payer: Medicare Other | Source: Ambulatory Visit | Attending: Internal Medicine | Admitting: Internal Medicine

## 2022-05-06 DIAGNOSIS — R945 Abnormal results of liver function studies: Secondary | ICD-10-CM | POA: Diagnosis not present

## 2022-05-06 DIAGNOSIS — R7989 Other specified abnormal findings of blood chemistry: Secondary | ICD-10-CM

## 2022-05-07 ENCOUNTER — Ambulatory Visit
Admission: RE | Admit: 2022-05-07 | Discharge: 2022-05-07 | Disposition: A | Discharge status: Home / Self Care | Payer: Medicare Other | Source: Ambulatory Visit | Attending: Internal Medicine | Admitting: Internal Medicine

## 2022-05-07 DIAGNOSIS — Z1231 Encounter for screening mammogram for malignant neoplasm of breast: Secondary | ICD-10-CM | POA: Diagnosis not present

## 2022-06-25 DIAGNOSIS — E042 Nontoxic multinodular goiter: Secondary | ICD-10-CM | POA: Diagnosis not present

## 2022-06-25 DIAGNOSIS — E041 Nontoxic single thyroid nodule: Secondary | ICD-10-CM | POA: Diagnosis not present

## 2022-06-28 ENCOUNTER — Other Ambulatory Visit: Payer: Self-pay | Admitting: Internal Medicine

## 2022-06-28 DIAGNOSIS — E785 Hyperlipidemia, unspecified: Secondary | ICD-10-CM

## 2022-07-03 ENCOUNTER — Other Ambulatory Visit: Payer: Self-pay | Admitting: Internal Medicine

## 2022-07-03 DIAGNOSIS — I10 Essential (primary) hypertension: Secondary | ICD-10-CM

## 2022-09-18 LAB — HM DIABETES EYE EXAM

## 2022-09-25 DIAGNOSIS — H2512 Age-related nuclear cataract, left eye: Secondary | ICD-10-CM | POA: Diagnosis not present

## 2022-09-27 ENCOUNTER — Other Ambulatory Visit: Payer: Self-pay | Admitting: Internal Medicine

## 2022-09-27 DIAGNOSIS — E876 Hypokalemia: Secondary | ICD-10-CM

## 2022-09-27 DIAGNOSIS — I10 Essential (primary) hypertension: Secondary | ICD-10-CM

## 2022-10-01 ENCOUNTER — Other Ambulatory Visit: Payer: Self-pay | Admitting: Internal Medicine

## 2022-10-01 DIAGNOSIS — I10 Essential (primary) hypertension: Secondary | ICD-10-CM

## 2022-10-14 ENCOUNTER — Other Ambulatory Visit: Payer: Self-pay | Admitting: Internal Medicine

## 2022-10-14 DIAGNOSIS — I10 Essential (primary) hypertension: Secondary | ICD-10-CM

## 2022-10-15 ENCOUNTER — Ambulatory Visit
Admission: RE | Admit: 2022-10-15 | Discharge: 2022-10-15 | Disposition: A | Discharge status: Home / Self Care | Payer: Medicare Other | Source: Ambulatory Visit | Attending: Internal Medicine | Admitting: Internal Medicine

## 2022-10-15 ENCOUNTER — Encounter: Payer: Self-pay | Admitting: Internal Medicine

## 2022-10-15 DIAGNOSIS — Z780 Asymptomatic menopausal state: Secondary | ICD-10-CM | POA: Diagnosis not present

## 2022-10-15 DIAGNOSIS — E2839 Other primary ovarian failure: Secondary | ICD-10-CM

## 2022-10-16 DIAGNOSIS — H26491 Other secondary cataract, right eye: Secondary | ICD-10-CM | POA: Diagnosis not present

## 2022-10-20 ENCOUNTER — Ambulatory Visit (INDEPENDENT_AMBULATORY_CARE_PROVIDER_SITE_OTHER): Payer: Medicare Other

## 2022-10-20 ENCOUNTER — Ambulatory Visit (INDEPENDENT_AMBULATORY_CARE_PROVIDER_SITE_OTHER): Payer: Medicare Other | Admitting: Internal Medicine

## 2022-10-20 ENCOUNTER — Encounter: Payer: Self-pay | Admitting: Internal Medicine

## 2022-10-20 VITALS — BP 128/82 | HR 88 | Temp 98.6°F | Resp 16 | Ht 63.0 in | Wt 183.0 lb

## 2022-10-20 DIAGNOSIS — R7303 Prediabetes: Secondary | ICD-10-CM

## 2022-10-20 DIAGNOSIS — E876 Hypokalemia: Secondary | ICD-10-CM | POA: Diagnosis not present

## 2022-10-20 DIAGNOSIS — R7989 Other specified abnormal findings of blood chemistry: Secondary | ICD-10-CM | POA: Diagnosis not present

## 2022-10-20 DIAGNOSIS — R052 Subacute cough: Secondary | ICD-10-CM | POA: Diagnosis not present

## 2022-10-20 DIAGNOSIS — I10 Essential (primary) hypertension: Secondary | ICD-10-CM

## 2022-10-20 DIAGNOSIS — R059 Cough, unspecified: Secondary | ICD-10-CM | POA: Diagnosis not present

## 2022-10-20 DIAGNOSIS — N1831 Chronic kidney disease, stage 3a: Secondary | ICD-10-CM

## 2022-10-20 DIAGNOSIS — E041 Nontoxic single thyroid nodule: Secondary | ICD-10-CM | POA: Diagnosis not present

## 2022-10-20 DIAGNOSIS — T502X5A Adverse effect of carbonic-anhydrase inhibitors, benzothiadiazides and other diuretics, initial encounter: Secondary | ICD-10-CM | POA: Diagnosis not present

## 2022-10-20 LAB — URINALYSIS, ROUTINE W REFLEX MICROSCOPIC
Bilirubin Urine: NEGATIVE
Hgb urine dipstick: NEGATIVE
Ketones, ur: NEGATIVE
Leukocytes,Ua: NEGATIVE
Nitrite: NEGATIVE
RBC / HPF: NONE SEEN (ref 0–?)
Specific Gravity, Urine: 1.015 (ref 1.000–1.030)
Total Protein, Urine: NEGATIVE
Urine Glucose: NEGATIVE
Urobilinogen, UA: 0.2 (ref 0.0–1.0)
pH: 6 (ref 5.0–8.0)

## 2022-10-20 LAB — BASIC METABOLIC PANEL
BUN: 16 mg/dL (ref 6–23)
CO2: 32 mEq/L (ref 19–32)
Calcium: 9.6 mg/dL (ref 8.4–10.5)
Chloride: 98 mEq/L (ref 96–112)
Creatinine, Ser: 1.1 mg/dL (ref 0.40–1.20)
GFR: 51.96 mL/min — ABNORMAL LOW (ref >60.00)
Glucose, Bld: 98 mg/dL (ref 70–99)
Potassium: 3.2 mEq/L — ABNORMAL LOW (ref 3.5–5.1)
Sodium: 137 mEq/L (ref 135–145)

## 2022-10-20 LAB — CBC WITH DIFFERENTIAL/PLATELET
Basophils Absolute: 0 10*3/uL (ref 0.0–0.1)
Basophils Relative: 0.3 % (ref 0.0–3.0)
Eosinophils Absolute: 0.2 10*3/uL (ref 0.0–0.7)
Eosinophils Relative: 2.2 % (ref 0.0–5.0)
HCT: 38.4 % (ref 36.0–46.0)
Hemoglobin: 12.8 g/dL (ref 12.0–15.0)
Lymphocytes Relative: 26.9 % (ref 12.0–46.0)
Lymphs Abs: 2.3 10*3/uL (ref 0.7–4.0)
MCHC: 33.3 g/dL (ref 30.0–36.0)
MCV: 85.2 fl (ref 78.0–100.0)
Monocytes Absolute: 0.5 10*3/uL (ref 0.1–1.0)
Monocytes Relative: 5.5 % (ref 3.0–12.0)
Neutro Abs: 5.6 10*3/uL (ref 1.4–7.7)
Neutrophils Relative %: 65.1 % (ref 43.0–77.0)
Platelets: 350 10*3/uL (ref 150.0–400.0)
RBC: 4.51 Mil/uL (ref 3.87–5.11)
RDW: 13.8 % (ref 11.5–15.5)
WBC: 8.6 10*3/uL (ref 4.0–10.5)

## 2022-10-20 LAB — TSH: TSH: 1.44 u[IU]/mL (ref 0.35–5.50)

## 2022-10-20 LAB — HEPATIC FUNCTION PANEL
ALT: 16 U/L (ref 0–35)
AST: 20 U/L (ref 0–37)
Albumin: 4.2 g/dL (ref 3.5–5.2)
Alkaline Phosphatase: 77 U/L (ref 39–117)
Bilirubin, Direct: 0.1 mg/dL (ref 0.0–0.3)
Total Bilirubin: 0.6 mg/dL (ref 0.2–1.2)
Total Protein: 7.8 g/dL (ref 6.0–8.3)

## 2022-10-20 LAB — HEMOGLOBIN A1C: Hgb A1c MFr Bld: 6.3 % (ref 4.6–6.5)

## 2022-10-20 LAB — PROTIME-INR
INR: 0.9 ratio (ref 0.8–1.0)
Prothrombin Time: 10 s (ref 9.6–13.1)

## 2022-10-20 LAB — MAGNESIUM: Magnesium: 2 mg/dL (ref 1.5–2.5)

## 2022-10-20 MED ORDER — POTASSIUM CHLORIDE ER 10 MEQ PO TBCR: 10.0000 meq | EXTENDED_RELEASE_TABLET | Freq: Three times a day (TID) | ORAL | 0 refills | Status: DC

## 2022-10-20 NOTE — Progress Notes (Signed)
Subjective:  Patient ID: Latasha Wood, female    DOB: [birth year]  Age: 66 y.o. MRN: [id number]  CC: Cough   HPI Sashay C Bagheri presents for f/up -  She complains of a one month history or NP cough. She is not taking the K+ supplement because the pill is too large.  Outpatient Medications Prior to Visit  Medication Sig Dispense Refill   ALPRAZolam (XANAX) 0.25 MG tablet Take 1 tablet (0.25 mg total) by mouth 2 (two) times daily as needed for anxiety. 60 tablet 3   cholecalciferol (VITAMIN D3) 25 MCG (1000 UNIT) tablet Take 1,000 Units by mouth daily.     hydrocortisone 2.5 % cream Apply topically 2 (two) times daily. 30 g 3   indapamide (LOZOL) 1.25 MG tablet TAKE 1 TABLET BY MOUTH EVERY DAY 90 tablet 0   loperamide (IMODIUM) 2 MG capsule Take 2 mg by mouth as needed for diarrhea or loose stools.     losartan (COZAAR) 25 MG tablet Take 25 mg by mouth at bedtime.     Multiple Vitamin (MULTIVITAMIN WITH MINERALS) TABS tablet Take 1 tablet by mouth daily.     rosuvastatin (CRESTOR) 10 MG tablet TAKE 1 TABLET BY MOUTH EVERY DAY 90 tablet 1   Potassium Chloride ER (KLOR-CON M15) 15 MEQ TBCR TAKE 1 TABLET (15 MEQ TOTAL) BY MOUTH 2 (TWO) TIMES DAILY. 180 each 0   No facility-administered medications prior to visit.    ROS Review of Systems  Constitutional: Negative.  Negative for chills, diaphoresis, fatigue and fever.  HENT: Negative.    Eyes: Negative.   Respiratory:  Positive for cough. Negative for chest tightness, shortness of breath and wheezing.   Cardiovascular:  Negative for chest pain, palpitations and leg swelling.  Gastrointestinal:  Negative for abdominal pain, diarrhea and nausea.  Endocrine: Negative.   Genitourinary: Negative.  Negative for difficulty urinating.  Musculoskeletal: Negative.   Skin: Negative.   Allergic/Immunologic: Negative.   Neurological: Negative.  Negative for dizziness, weakness and light-headedness.  Hematological:  Negative for  adenopathy. Does not bruise/bleed easily.  Psychiatric/Behavioral: Negative.      Objective:  BP 128/82 (BP Location: Left Arm, Patient Position: Sitting, Cuff Size: Large)   Pulse 88   Temp 98.6 F (37 C) (Oral)   Resp 16   Ht 5' 3"  (1.6 m)   Wt 183 lb (83 kg)   SpO2 95%   BMI 32.42 kg/m   BP Readings from Last 3 Encounters:  10/20/22 128/82  04/30/22 138/84  04/21/22 130/76    Wt Readings from Last 3 Encounters:  10/20/22 183 lb (83 kg)  04/30/22 174 lb (78.9 kg)  04/21/22 177 lb (80.3 kg)    Physical Exam Vitals reviewed.  Constitutional:      Appearance: She is not ill-appearing.  HENT:     Mouth/Throat:     Mouth: Mucous membranes are moist.  Eyes:     General: No scleral icterus.    Conjunctiva/sclera: Conjunctivae normal.  Neck:     Thyroid: No thyroid mass, thyromegaly or thyroid tenderness.  Cardiovascular:     Rate and Rhythm: Normal rate and regular rhythm.     Heart sounds: No murmur heard. Pulmonary:     Effort: Pulmonary effort is normal.     Breath sounds: No stridor. No wheezing, rhonchi or rales.  Abdominal:     General: Abdomen is flat.     Palpations: There is no mass.     Tenderness: There is no  abdominal tenderness. There is no guarding.     Hernia: No hernia is present.  Musculoskeletal:        General: Normal range of motion.     Cervical back: Neck supple.     Right lower leg: No edema.     Left lower leg: No edema.  Lymphadenopathy:     Cervical: No cervical adenopathy.  Skin:    General: Skin is warm and dry.  Neurological:     General: No focal deficit present.     Mental Status: She is alert.  Psychiatric:        Mood and Affect: Mood normal.        Behavior: Behavior normal.     Lab Results  Component Value Date   WBC 8.6 10/20/2022   HGB 12.8 10/20/2022   HCT 38.4 10/20/2022   PLT 350.0 10/20/2022   GLUCOSE 98 10/20/2022   CHOL 161 04/30/2022   TRIG 82.0 04/30/2022   HDL 101.80 04/30/2022   LDLDIRECT 114.0  04/29/2017   LDLCALC 43 04/30/2022   ALT 16 10/20/2022   AST 20 10/20/2022   NA 137 10/20/2022   K 3.2 (L) 10/20/2022   CL 98 10/20/2022   CREATININE 1.10 10/20/2022   BUN 16 10/20/2022   CO2 32 10/20/2022   TSH 1.44 10/20/2022   INR 0.9 10/20/2022   HGBA1C 6.3 10/20/2022   MICROALBUR 1.1 03/23/2016    DG Bone Density  Result Date: 10/15/2022 EXAM: DUAL X-RAY ABSORPTIOMETRY (DXA) FOR BONE MINERAL DENSITY IMPRESSION: Referring Physician:  Janith Lima Your patient completed a bone mineral density test using GE Lunar iDXA system (analysis version: 16). Technologist: lmn PATIENT: Name: Latasha Wood Patient ID: [id number] Birth Date: 06-09-1956 Height: 63.8 in. Sex: Female Measured: 10/15/2022 Weight: 184.2 lbs. Indications: Estrogen Deficient, Hysterectomy, Postmenopausal, Ulcerative Colitis Fractures: NONE Treatments: Multivitamin, Vitamin D (E933.5) ASSESSMENT: The BMD measured at Femur Neck Left is 0.953 g/cm2 with a T-score of -0.6. This patient is considered normal according to [facility]) criteria. The quality of the exam is good. L3 was excluded due to degenerative changes. Site Region Measured Date Measured Age YA BMD Significant CHANGE T-score DualFemur Neck Left  9/15/2021    [age]         -0.5    0.953 g/cm2 AP Spine  L1-L4 (L3) 10[age]15/2021    [age]         0.5     1.245 g/cm2 DualFemur Total Mean 10/15/2022    [age]         -0.2    0.974 g/cm2 World Health Organization [facility]) criteria for post-menopausal, Caucasian Women: Normal       T-score at or above -1 SD Osteopenia   T-score between -1 and -2.5 SD Osteoporosis T-score at or below -2.5 SD RECOMMENDATION: 1. All patients should optimize calcium and vitamin D intake. 2. Consider FDA-approved medical therapies in postmenopausal women and men aged 3 years and older, based on the following: a. A hip or vertebral (clinical or morphometric) fracture. b. T-score = -2.5 at the femoral neck or spine after appropriate  evaluation to exclude secondary causes. c. Low bone mass (T-score between -1.0 and -2.5 at the femoral neck or spine) and a 10-year probability of a hip fracture = 3% or a 10-year probability of a major osteoporosis-related fracture = 20% based on the [place]-adapted WHO algorithm. d. Clinician judgment and/or patient preferences may indicate treatment for people with 10-year fracture probabilities above or below these  levels. FOLLOW-UP: Patients with diagnosis of osteoporosis or at high risk for fracture should have regular bone mineral density tests. Patients eligible for Medicare are allowed routine testing every 2 years. The testing frequency can be increased to one year for patients who have rapidly progressing disease, are receiving or discontinuing medical therapy to restore bone mass, or have additional risk factors. I have reviewed this study and agree with the findings. [facility] Electronically Signed   By: Ammie Ferrier M.D.   On: 10/15/2022 08:39    DG Chest 2 View  Result Date: 10/20/2022 CLINICAL DATA:  66 year old female with cough EXAM: CHEST - 2 VIEW COMPARISON:  12/11/2015 FINDINGS: Cardiomediastinal silhouette unchanged in size and contour. No evidence of central vascular congestion. No interlobular septal thickening. No pneumothorax or pleural effusion. Coarsened interstitial markings, with no confluent airspace disease. No acute displaced fracture. Degenerative changes of the spine. IMPRESSION: Negative for acute cardiopulmonary disease Electronically Signed   By: Corrie Mckusick D.O.   On: 10/20/2022 11:01   DG Bone Density  Result Date: 10/15/2022 EXAM: DUAL X-RAY ABSORPTIOMETRY (DXA) FOR BONE MINERAL DENSITY IMPRESSION: Referring Physician:  Janith Lima Your patient completed a bone mineral density test using GE Lunar iDXA system (analysis version: 16). Technologist: lmn PATIENT: Name: Sarabi, [place]ockwell Patient ID: [id number] Birth Date: 28-Oct-1956 Height: 63.8  in. Sex: Female Measured: 10/15/2022 Weight: 184.2 lbs. Indications: Estrogen Deficient, Hysterectomy, Postmenopausal, Ulcerative Colitis Fractures: NONE Treatments: Multivitamin, Vitamin D (E933.5) ASSESSMENT: The BMD measured at Femur Neck Left is 0.953 g/cm2 with a T-score of -0.6. This patient is considered normal according to [facility]) criteria. The quality of the exam is good. L3 was excluded due to degenerative changes. Site Region Measured Date Measured Age YA BMD Significant CHANGE T-score DualFemur Neck Left  10[age]15/2021    66.5         -0.6    0.953 g/cm2 AP Spine  L1-L4 (L3) 10/15/2022    [age]         0.5     1.245 g/cm2 DualFemur Total Mean 10/15/2022    67.4         -0.3    0.974 g/cm2 World Health Organization [facility]) criteria for post-menopausal, Caucasian Women: Normal       T-score at or above -1 SD Osteopenia   T-score between -1 and -2.5 SD Osteoporosis T-score at or below -2.5 SD RECOMMENDATION: 1. All patients should optimize calcium and vitamin D intake. 2. Consider FDA-approved medical therapies in postmenopausal women and men aged 79 years and older, based on the following: a. A hip or vertebral (clinical or morphometric) fracture. b. T-score = -2.5 at the femoral neck or spine after appropriate evaluation to exclude secondary causes. c. Low bone mass (T-score between -1.0 and -2.5 at the femoral neck or spine) and a 10-year probability of a hip fracture = 3% or a 10-year probability of a major osteoporosis-related fracture = 20% based on the [place]-adapted WHO algorithm. d. Clinician judgment and/or patient preferences may indicate treatment for people with 10-year fracture probabilities above or below these levels. FOLLOW-UP: Patients with diagnosis of osteoporosis or at high risk for fracture should have regular bone mineral density tests. Patients eligible for Medicare are allowed routine testing every 2 years. The testing frequency can be increased to one year for  patients who have rapidly progressing disease, are receiving or discontinuing medical therapy to restore bone mass, or have additional risk factors. I have reviewed this study  and agree with the findings. [facility] Electronically Signed   By: Ammie Ferrier M.D.   On: 10/15/2022 08:39     Assessment & Plan:   Taunja was seen today for cough.  Diagnoses and all orders for this visit:  Right thyroid nodule- She is euthyroid. -     TSH; Future -     TSH  Essential hypertension- Her BP is well controlled. I will treat the low K+. -     Basic metabolic panel; Future -     CBC with Differential/Platelet; Future -     Urinalysis, Routine w reflex microscopic; Future -     Urinalysis, Routine w reflex microscopic -     CBC with Differential/Platelet -     Basic metabolic panel -     potassium chloride (KLOR-CON 10) 10 MEQ tablet; Take 1 tablet (10 mEq total) by mouth 3 (three) times daily.  Stage 3a chronic kidney disease ([place])- Her renal function is stable. -     Basic metabolic panel; Future -     Urinalysis, Routine w reflex microscopic; Future -     Urinalysis, Routine w reflex microscopic -     Basic metabolic panel  Prediabetes -     Hemoglobin A1c; Future -     Hemoglobin A1c  Diuretic-induced hypokalemia -     Magnesium; Future -     Basic metabolic panel; Future -     Basic metabolic panel -     Magnesium -     potassium chloride (KLOR-CON 10) 10 MEQ tablet; Take 1 tablet (10 mEq total) by mouth 3 (three) times daily.  Elevated LFTs- LFTs are normal. -     Hepatic function panel; Future -     Protime-INR; Future -     Protime-INR -     Hepatic function panel  Subacute cough- Exam and CXR are reassuring. -     DG Chest 2 View; Future   I have discontinued Jenniger C. Cobin's Klor-Con M15. I am also having her start on potassium chloride. Additionally, I am having her maintain her cholecalciferol, multivitamin with minerals, loperamide,  hydrocortisone, losartan, ALPRAZolam, rosuvastatin, and indapamide.  Meds ordered this encounter  Medications   potassium chloride (KLOR-CON 10) 10 MEQ tablet    Sig: Take 1 tablet (10 mEq total) by mouth 3 (three) times daily.    Dispense:  270 tablet    Refill:  0     Follow-up: Return in about 3 months (around 01/19/2023).  Scarlette Calico, MD

## 2022-10-20 NOTE — Patient Instructions (Signed)

## 2022-11-03 DIAGNOSIS — H40003 Preglaucoma, unspecified, bilateral: Secondary | ICD-10-CM | POA: Diagnosis not present

## 2022-11-03 LAB — HM DIABETES EYE EXAM

## 2022-11-10 NOTE — Progress Notes (Unsigned)
    Latasha Mccreedy D.[place] Phone: [phone]   Assessment and Plan:     There are no diagnoses linked to this encounter.  ***   Pertinent previous records reviewed include ***   Follow Up: ***     Subjective:   I, Latasha Wood, am serving as a Education administrator for Doctor Glennon Mac   Chief Complaint: bilateral knee pain    HPI:    08/22/21 Patient is a 67 year old female presenting with bilateral knee pain worse when walking. Patient has had pain in both knees for several years but in the last year it has been worse. Patient locates pain to front on knee cap and the back of her knee feels like a constant aching worse when laying. Patient is a walker and once she gets going the pain will eventually subside. Stopped the meloxicam since she found out her GFR has dropped.    Radiates: yes down to the shins  Swelling: sometimes Mechanical symptoms: yes  Numbness/tingling: tingling pain that starts lateral R leg above the knee down the leg  Weakness: rarely, but in the right leg  Aggravates: sitting to standing, going up and down steps  Treatments tried: brace, meloxicam, tylenol,    04/21/2022 Patient states that her knee are hurting real bad wants a CSI    11/11/2022 Patient states   Relevant Historical Information: Hypertension, ulcerative colitis with history of resection  Additional pertinent review of systems negative.   Current Outpatient Medications:    ALPRAZolam (XANAX) 0.25 MG tablet, Take 1 tablet (0.25 mg total) by mouth 2 (two) times daily as needed for anxiety., Disp: 60 tablet, Rfl: 3   cholecalciferol (VITAMIN D3) 25 MCG (1000 UNIT) tablet, Take 1,000 Units by mouth daily., Disp: , Rfl:    hydrocortisone 2.5 % cream, Apply topically 2 (two) times daily., Disp: 30 g, Rfl: 3   indapamide (LOZOL) 1.25 MG tablet, TAKE 1 TABLET BY MOUTH EVERY DAY, Disp: 90 tablet, Rfl: 0   loperamide  (IMODIUM) 2 MG capsule, Take 2 mg by mouth as needed for diarrhea or loose stools., Disp: , Rfl:    losartan (COZAAR) 25 MG tablet, Take 25 mg by mouth at bedtime., Disp: , Rfl:    Multiple Vitamin (MULTIVITAMIN WITH MINERALS) TABS tablet, Take 1 tablet by mouth daily., Disp: , Rfl:    potassium chloride (KLOR-CON 10) 10 MEQ tablet, Take 1 tablet (10 mEq total) by mouth 3 (three) times daily., Disp: 270 tablet, Rfl: 0   rosuvastatin (CRESTOR) 10 MG tablet, TAKE 1 TABLET BY MOUTH EVERY DAY, Disp: 90 tablet, Rfl: 1   Objective:     There were no vitals filed for this visit.    There is no height or weight on file to calculate BMI.    Physical Exam:    ***   Electronically signed by:  Latasha Wood Sports [facility] 1:40 PM 11/10/22

## 2022-11-11 ENCOUNTER — Ambulatory Visit: Payer: Medicare Other | Admitting: Sports Medicine

## 2022-11-11 VITALS — BP 132/80 | HR 98 | Ht 63.0 in | Wt 190.0 lb

## 2022-11-11 DIAGNOSIS — G8929 Other chronic pain: Secondary | ICD-10-CM

## 2022-11-11 DIAGNOSIS — M170 Bilateral primary osteoarthritis of knee: Secondary | ICD-10-CM | POA: Diagnosis not present

## 2022-11-17 ENCOUNTER — Ambulatory Visit: Payer: Medicare Other | Admitting: Internal Medicine

## 2022-11-23 ENCOUNTER — Other Ambulatory Visit: Payer: Self-pay | Admitting: Internal Medicine

## 2022-11-23 DIAGNOSIS — F411 Generalized anxiety disorder: Secondary | ICD-10-CM

## 2022-12-22 ENCOUNTER — Other Ambulatory Visit: Payer: Self-pay | Admitting: Internal Medicine

## 2022-12-22 DIAGNOSIS — E785 Hyperlipidemia, unspecified: Secondary | ICD-10-CM

## 2023-02-02 ENCOUNTER — Telehealth: Payer: Self-pay | Admitting: Internal Medicine

## 2023-02-02 NOTE — Telephone Encounter (Signed)
Contacted Latasha Wood to schedule their annual wellness visit. Appointment made for 02/04/23.  Latasha Wood AWV direct phone # [phone]

## 2023-02-04 ENCOUNTER — Telehealth (INDEPENDENT_AMBULATORY_CARE_PROVIDER_SITE_OTHER): Payer: Medicare Other | Admitting: Family Medicine

## 2023-02-04 ENCOUNTER — Encounter: Payer: Self-pay | Admitting: Family Medicine

## 2023-02-04 DIAGNOSIS — Z0000 Encounter for general adult medical examination without abnormal findings: Secondary | ICD-10-CM

## 2023-02-04 NOTE — Progress Notes (Signed)
PATIENT CHECK-IN and HEALTH RISK ASSESSMENT QUESTIONNAIRE:  -completed by phone/video for upcoming Medicare Preventive Visit  Pre-Visit Check-in: 1)Vitals (height, wt, BP, etc) - record in vitals section for visit on day of visit 2)Review and Update Medications, Allergies PMH, Surgeries, Social history in Epic 3)Hospitalizations in the last year with date/reason? no  4)Review and Update Care Team (patient's specialists) in Epic 5) Complete PHQ9 in Epic  6) Complete Fall Screening in Epic 7)Review all Health Maintenance Due and order under PCP if not done.  8)Medicare Wellness Questionnaire: Answer theses question about your habits: Do you drink alcohol? Yes  If yes, how many drinks do you have a day?occasionally Have you ever smoked?no Quit date if applicable? N/A  How many packs a day do/did you smoke?  Do you use smokeless tobacco?no Do you use an illicit drugs?no Do you exercises? Yes  IF so, what type and how many days/minutes per week? Occasional walking 2-3x a week - was doing 2-3 miles, has done less this last month - but has been busy and active with grand kids Are you sexually active? Yes Number of partners?1 Eat lots of veggies, has a garden, wonders about best/nutrient dense foods as has hx of colectomy Typical breakfast-cereal, eggs, french toast Typical lunch-often skips lunch Typical dinner-Chicken, salads, potatoes Typical snacks: sweets-cookies rarely  Beverages: water mostly; coffee or iced tea  Answer theses question about you: Can you perform most household chores?yes Do you find it hard to follow a conversation in a noisy room?no Do you often ask people to speak up or repeat themselves?no Do you feel that you have a problem with memory?no Do you balance your checkbook and or bank acounts?yes Do you feel safe at home?yes Last dentist visit? February 2024 Do you need assistance with any of the following: Please note if so   Driving? no  Feeding  yourself?no  Getting from bed to chair? no  Getting to the toilet? no  Bathing or showering? no  Dressing yourself? no  Managing money?no   Climbing a flight of stairs-no  Preparing meals? no  Do you have Advanced Directives in place (Living Will, Healthcare Power or [place])? No - plans to do.   Last eye Exam and location? November 17,2023-[facility]   Do you currently use prescribed or non-prescribed narcotic or opioid pain medications?no  Do you have a history or close family history of breast, ovarian, tubal or peritoneal cancer or a family member with BRCA (breast cancer susceptibility 1 and 2) gene mutations? Mother and sister had breast cancer  Nurse/Assistant Credentials/time stamp: Luellen Pucker   ----------------------------------------------------------------------------------------------------------------------------------------------------------------------------------------------------------------------   MEDICARE ANNUAL PREVENTIVE VISIT WITH PROVIDER: (Welcome to Medicare, initial annual wellness or annual wellness exam)  Virtual Visit via Video Note  I connected with Latasha Wood on 02/04/23 by a video enabled telemedicine application and verified that I am speaking with the correct person using two identifiers.  Location patient: home Location provider:work or home office Persons participating in the virtual visit: patient, provider  Concerns and/or follow up today: reports stable.    See HM section in Epic for other details of completed HM.    ROS: negative for report of fevers, unintentional weight loss, vision changes, vision loss, hearing loss or change, chest pain, sob, hemoptysis, melena, hematochezia, hematuria, falls, bleeding or bruising, loc, thoughts of suicide or self harm, memory loss  Patient-completed extensive health risk assessment - reviewed and discussed with the patient: See Health Risk Assessment completed  with  patient prior to the visit either above or in recent phone note. This was reviewed in detailed with the patient today and appropriate recommendations, orders and referrals were placed as needed per Summary below and patient instructions.   Review of Medical History: -PMH, [place], Family History and current specialty and care providers reviewed and updated and listed below   Patient Care Team: Janith Lima, MD as PCP - General (Internal Medicine) Lafayette Dragon, MD (Inactive) (Gastroenterology) Brien Few, MD (Obstetrics and Gynecology) Kem Parkinson, MD (Ophthalmology) Leighton Ruff, MD as Consulting Physician (Colon and Rectal Surgery) Charlton Haws, [facility] as Pharmacist (Pharmacist)   Past Medical History:  Diagnosis Date   Anemia    Anxiety    Blood transfusion    Colitis, ulcerative ([place])    Colon polyps    Hyperlipidemia    Hypertension    Ileostomy present ([place])    Numbness and tingling    hands and feet bilat    Shortness of breath dyspnea    talking or walking    Type 2 diabetes mellitus with complication, without long-term current use of insulin ([place]) 09/05/2015   currently on no medications, (05/26/2016) pt denies diabetes.  States that she had one time high blood sugars d/t prednisone    Past Surgical History:  Procedure Laterality Date   ABDOMINAL HYSTERECTOMY  2000   ABDOMINAL SURGERY     BREAST CYST ASPIRATION     CATARACT EXTRACTION W/PHACO Right 10/20/2017   Procedure: CATARACT EXTRACTION PHACO AND INTRAOCULAR LENS PLACEMENT ([place]);  Surgeon: [name], MD;  Location: [facility] ORS;  Service: Ophthalmology;  Laterality: Right;  [place] 00:50.1 AP% 13.2 CDE 6.61 Fluid Pack lot # [id number]   COLON SURGERY     colostomy   cortisol shot     in left shoulder   DILATION AND CURETTAGE OF UTERUS     most likely after miscarriage   fibrocystic breast disease     q 6 month mammogram   gravida 6 para 2     all SAB   hyadiform mole     ILEO LOOP  DIVERSION N/A 09/25/2015   Procedure: ILEO LOOP COLOSTOMY;  Surgeon: Leighton Ruff, MD;  Location: [facility];  Service: General;  Laterality: N/A;   ILEOSTOMY CLOSURE N/A 10/08/2015   Procedure: ILEOSTOMY REVISION;  Surgeon: Leighton Ruff, MD;  Location: [facility];  Service: General;  Laterality: N/A;   ILEOSTOMY CLOSURE N/A 08/20/2016   Procedure: OPEN RELOCATION OF ILEOSTOMY;  Surgeon: Leighton Ruff, MD;  Location: [facility];  Service: General;  Laterality: N/A;   IR GENERIC HISTORICAL  09/01/2016   IR US GUIDE VASC ACCESS RIGHT 09/01/2016 Ascencion Dike, PA-C [facility]   IR GENERIC HISTORICAL  09/01/2016   IR FLUORO GUIDE CV LINE RIGHT 09/01/2016 Ascencion Dike, PA-C [facility]   KNEE ARTHROSCOPY W/ MENISCAL REPAIR     right knee ([place])   LAPAROSCOPIC SMALL BOWEL RESECTION N/A 09/25/2015   Procedure: LAPAROSCOPIC BOWEL RESECTION TIMES TWO;  Surgeon: Leighton Ruff, MD;  Location: [facility];  Service: General;  Laterality: N/A;   ROBOTIC ASSISTED LAPAROSCOPIC LYSIS OF ADHESION N/A 09/25/2015   Procedure: XI ROBOTIC ASSISTED LAPAROSCOPIC LYSIS OF ADHESION;  Surgeon: Leighton Ruff, MD;  Location: [facility];  Service: General;  Laterality: N/A;  90 minutes   Small bowel obstruction      Social History   Socioeconomic History   Marital status: Married    Spouse name: Not on file   Number of children:  2   Years of education: 40   Highest education level: Not on file  Occupational History   Occupation: customer relations    Employer: [employer]    Comment: [employer]  Tobacco Use   Smoking status: Never   Smokeless tobacco: Never  Substance and Sexual Activity   Alcohol use: Yes    Alcohol/week: 2.0 standard drinks of alcohol    Types: 2 Standard drinks or equivalent per week    Comment: occ   Drug use: No   Sexual activity: Yes    Partners: Male  Other Topics Concern   Not on file  Social History Narrative   HSG, [employer] - BA, [employer] for med tech.  Married '75. 2 dtrs - '79, '82; 1  grandchild. Marriage is in good health. No abuse issues.    Social Determinants of Health   Financial Resource Strain: Low Risk  (05/16/2020)   Overall Financial Resource Strain (CARDIA)    Difficulty of Paying Living Expenses: Not hard at all  Food Insecurity: Not on file  Transportation Needs: Not on file  Physical Activity: Not on file  Stress: Not on file  Social Connections: Not on file  Intimate Partner Violence: Not on file    Family History  Problem Relation Age of Onset   Diabetes Mother    Breast cancer Mother 53   Diabetes Father    Heart disease Father    Diabetes Sister    Diabetes Brother    Breast cancer Sister 75   Colon cancer Paternal Aunt    Breast cancer Maternal Aunt     Current Outpatient Medications on File Prior to Visit  Medication Sig Dispense Refill   ALPRAZolam (XANAX) 0.25 MG tablet TAKE 1 TABLET BY MOUTH 2 TIMES DAILY AS NEEDED FOR ANXIETY. 60 tablet 2   cholecalciferol (VITAMIN D3) 25 MCG (1000 UNIT) tablet Take 1,000 Units by mouth daily.     Cyanocobalamin (VITAMIN B-12 PO) Take 500 mcg by mouth daily.     hydrocortisone 2.5 % cream Apply topically 2 (two) times daily. 30 g 3   indapamide (LOZOL) 1.25 MG tablet TAKE 1 TABLET BY MOUTH EVERY DAY 90 tablet 0   loperamide (IMODIUM) 2 MG capsule Take 2 mg by mouth as needed for diarrhea or loose stools.     losartan (COZAAR) 25 MG tablet Take 25 mg by mouth at bedtime.     Multiple Vitamin (MULTIVITAMIN WITH MINERALS) TABS tablet Take 1 tablet by mouth daily.     potassium chloride (KLOR-CON 10) 10 MEQ tablet Take 1 tablet (10 mEq total) by mouth 3 (three) times daily. 270 tablet 0   rosuvastatin (CRESTOR) 10 MG tablet TAKE 1 TABLET BY MOUTH EVERY DAY 90 tablet 1   No current facility-administered medications on file prior to visit.    Allergies  Allergen Reactions   Betadine [Povidone Iodine]    Latex Rash    Redness of skin   Tape Rash    Redness of skin OK with hypofix tape and paper  tape! NO transpore tape ,adhesive tape!       Physical Exam There were no vitals filed for this visit. Estimated body mass index is 33.66 kg/m as calculated from the following:   Height as of 11/11/22: 5\' 3"  (1.6 m).   Weight as of 11/11/22: 190 lb (86.2 kg).  EKG (optional): deferred due to virtual visit  GENERAL: alert, oriented, no acute distress detected, full vision exam deferred due to pandemic and/or  virtual encounter  HEENT: atraumatic, conjunttiva clear, no obvious abnormalities on inspection of external nose and ears  NECK: normal movements of the head and neck  LUNGS: on inspection no signs of respiratory distress, breathing rate appears normal, no obvious gross SOB, gasping or wheezing  CV: no obvious cyanosis  MS: moves all visible extremities without noticeable abnormality  PSYCH/NEURO: pleasant and cooperative, no obvious depression or anxiety, speech and thought processing grossly intact, Cognitive function grossly intact  Flowsheet Row Video Visit from 02/04/2023 in [facility] [place]  PHQ-9 Total Score 0           02/04/2023   10:49 AM 12/30/2021   11:07 AM 08/07/2021    1:27 PM 01/03/2020    9:35 AM 09/14/2019    9:08 AM  Depression screen PHQ 2/9  Decreased Interest 0 0 0 1 0  Down, Depressed, Hopeless 0 0 0 1 0  PHQ - 2 Score 0 0 0 2 0  Altered sleeping 0   1 1  Tired, decreased energy 0   1 1  Change in appetite 0   1 0  Feeling bad or failure about yourself  0   0 0  Trouble concentrating 0   0 0  Moving slowly or fidgety/restless 0   0 0  Suicidal thoughts 0   0 0  PHQ-9 Score 0   5 2  Difficult doing work/chores     Not difficult at all       01/16/2020    9:00 PM 01/17/2020   10:00 AM 04/16/2021   10:09 AM 12/30/2021   11:07 AM 02/04/2023   10:49 AM  Fall Risk  Falls in the past year?   0 0 0  Was there an injury with Fall?    0 0  Fall Risk Category Calculator    0 0  Fall Risk Category (Retired)    Low    (RETIRED) Patient Fall Risk Level Moderate fall risk Moderate fall risk  Low fall risk   Patient at Risk for Falls Due to     No Fall Risks  Fall risk Follow up     Falls evaluation completed     SUMMARY AND PLAN:  Encounter for Medicare annual wellness exam  Discussed applicable health maintenance/preventive health measures and advised and referred or ordered per patient preferences:  Health Maintenance  Topic Date Due   Diabetic kidney evaluation - Urine ACR  03/23/2017 - reports she sees kidney specialist for renal checks   FOOT EXAM  09/13/2020 - plans to request at next visit with PCP   COVID-19 Vaccine (8 - 2023-24 season) 02/20/2023 (Originally 10/15/2022) - she had latest booster in the fall and plans to get yearly.   HEMOGLOBIN A1C  04/21/2023   Diabetic kidney evaluation - eGFR measurement  10/21/2023   OPHTHALMOLOGY EXAM  11/04/2023   Medicare Annual Wellness (AWV)  02/04/2024   MAMMOGRAM  05/07/2024   DTaP/Tdap/Td (3 - Td or Tdap) 08/02/2031   Pneumonia Vaccine 75+ Years old  Completed   INFLUENZA VACCINE  Completed   DEXA SCAN  Completed   Hepatitis C Screening  Completed   Zoster Vaccines- Shingrix  Completed   HPV VACCINES  Aged Out   COLONOSCOPY (Pts 45-46yrs Insurance coverage will need to be confirmed)  Discontinued  Education and counseling on the following was provided based on the above review of health and a plan/checklist for the patient, along with additional information discussed, was  provided for the patient in the patient instructions :  -Advised on importance of completing advanced directives, discussed, provided additional info in patient instructions -Provided counseling and plan for increased risk of falling if applicable per above screening. Balance exercises provided.  -Advised and counseled on maintaining healthy weight and healthy lifestyle - including the importance of a healthy diet, regular physical activity, social connections and stress  management. - A summary of a healthy diet was provided in the Patient Instructions. Discussed Nutrient dense whole foods, veggies, fruits, whole grains, protein sources advise chewing well and eating slowly. Advised to avoid processed foods as much as possible.   -Recommended regular exercise and discussed options. Discussed exercise guidelines and options for fitting in some exercise even when schedule is busy or can't get out walking or to the gym. -Advise yearly dental visits at minimum and regular eye exams   Follow up: see patient instructions     Patient Instructions  I really enjoyed getting to talk with you today! I am available on Tuesdays and Thursdays for virtual visits if you have any questions or concerns, or if I can be of any further assistance.   CHECKLIST FROM ANNUAL WELLNESS VISIT:  -Follow up (please call to schedule if not scheduled after visit):   -yearly for annual wellness visit with primary care office  Here is a list of your preventive care/health maintenance measures and the plan for each if any are due:  Health Maintenance  Topic Date Due   Diabetic kidney evaluation - Urine ACR  03/23/2017   FOOT EXAM  09/13/2020   COVID-19 Vaccine (8 - 2023-24 season) 02/20/2023 (Originally 10/15/2022)   HEMOGLOBIN A1C  04/21/2023   Diabetic kidney evaluation - eGFR measurement  10/21/2023   OPHTHALMOLOGY EXAM  11/04/2023   Medicare Annual Wellness (AWV)  02/04/2024   MAMMOGRAM  05/07/2024   DTaP/Tdap/Td (3 - Td or Tdap) 08/02/2031   Pneumonia Vaccine 65+ Years old  Completed   INFLUENZA VACCINE  Completed   DEXA SCAN  Completed   Hepatitis C Screening  Completed   Zoster Vaccines- Shingrix  Completed   HPV VACCINES  Aged Out   COLONOSCOPY (Pts 45-73yrs Insurance coverage will need to be confirmed)  Discontinued    -See a dentist at least yearly  -Get your eyes checked and then per your eye specialist's recommendations  -Other issues addressed today:  -I have  included below further information regarding a healthy whole foods based diet, physical activity guidelines for adults, stress management and opportunities for social connections. I hope you find this information useful.   -----------------------------------------------------------------------------------------------------------------------------------------------------------------------------------------------------------------------------------------------------------  NUTRITION: -eat real food: lots of colorful vegetables (half the plate) and fruits -5-7 servings of vegetables and fruits per day (fresh or steamed is best), exp. 2 servings of vegetables with lunch and dinner and 2 servings of fruit per day. Berries and greens such as kale and collards are great choices.  -consume on a regular basis: whole grains (make sure first ingredient on label contains the word "whole"), fresh fruits, fish, nuts, seeds, healthy oils (such as olive oil, avocado oil, grape seed oil) -may eat small amounts of dairy and lean meat on occasion, but avoid processed meats such as ham, bacon, lunch meat, etc. -drink water -try to avoid fast food and pre-packaged foods, processed meat -most experts advise limiting sodium to < 2300mg  per day, should limit further is any chronic conditions such as high blood pressure, heart disease, diabetes, etc. The American Heart Association advised that <  1500mg  is is ideal -try to avoid foods that contain any ingredients with names you do not recognize  -try to avoid sugar/sweets (except for the natural sugar that occurs in fresh fruit) -try to avoid sweet drinks -try to avoid white rice, white bread, pasta (unless whole grain), white or yellow potatoes  EXERCISE GUIDELINES FOR ADULTS: -if you wish to increase your physical activity, do so gradually and with the approval of your doctor -STOP and seek medical care immediately if you have any chest pain, chest discomfort or trouble  breathing when starting or increasing exercise  -move and stretch your body, legs, feet and arms when sitting for long periods  -Physical activity guidelines for optimal health in adults: -least 150 minutes per week of aerobic exercise (can talk, but not sing) once approved by your doctor, 20-30 minutes of sustained activity or two 10 minute episodes of sustained activity every day.  -resistance training at least 2 days per week if approved by your doctor  -balance exercises 3+ days per week:   Stand somewhere where you have something sturdy to hold onto if you lose balance.    1) lift up on toes, start with 5x per day and work up to 20x   2) stand and lift on leg straight out to the side so that foot is a few inches of the floor, start with 5x each side and work up to 20x each side   3) stand on one foot, start with 5 seconds each side and work up to 20 seconds on each side  If you need ideas or help with getting more active:  -Silver sneakers https://tools.silversneakers.com  -Walk with a Doc: http://stephens-thompson.biz/  -try to include resistance (weight lifting/strength building) and balance exercises twice per week: or the following link for ideas: ChessContest.fr  UpdateClothing.com.cy  STRESS MANAGEMENT: -can try meditating, or just sitting quietly with deep breathing while intentionally relaxing all parts of your body for 5 minutes daily -if you need further help with stress, anxiety or depression please follow up with your primary doctor or contact the wonderful folks at [place]: [place]: -options in [place] if you wish to engage in more social and exercise related activities:  -Silver sneakers https://tools.silversneakers.com  -Walk with a Doc: http://stephens-thompson.biz/  -Check out the [place] 50+ section on the [place]  of [employer] (hiking clubs, book clubs, cards and games, chess, exercise classes, aquatic classes and much more) - see the website for details: https://www.[place].gov/departments/parks-recreation/active-adults50  -YouTube has lots of exercise videos for different ages and abilities as well  -[place] (a variety of indoor and outdoor inperson activities for adults). [phone]. [address]  -Virtual Online Classes (a variety of topics): see seniorplanet.org or call 6094659686  -consider volunteering at a school, hospice center, church, senior center or elsewhere  ADVANCED HEALTHCARE DIRECTIVES:  Everyone should have advanced health care directives in place. This is so that you get the care you want, should you ever be in a situation where you are unable to make your own medical decisions.   From the [place] Advanced Directive Website: "[place] are legal documents in which you give written instructions about your health care if, in the future, you cannot speak for yourself.   A health care power of attorney allows you to name a person you trust to make your health care decisions if you cannot make them yourself. A declaration of a desire for a natural  death (or living will) is document, which states that you desire not to have your life prolonged by extraordinary measures if you have a terminal or incurable illness or if you are in a vegetative state. An advance instruction for mental health treatment makes a declaration of instructions, information and preferences regarding your mental health treatment. It also states that you are aware that the advance instruction authorizes a mental health treatment provider to act according to your wishes. It may also outline your consent or refusal of mental health treatment. A declaration of an anatomical gift allows anyone over the age of 52 to make a gift by will, organ donor card or other  document."   Please see the following website or an elder law attorney for forms, FAQs and for completion of advanced directives: [place] Secretary of [place] ([email or web address].no)  Or copy and paste the following to your web browser: PokerReunion.[email or web address].cy           Latasha Kern, DO

## 2023-02-04 NOTE — Patient Instructions (Addendum)
I really enjoyed getting to talk with you today! I am available on Tuesdays and Thursdays for virtual visits if you have any questions or concerns, or if I can be of any further assistance.   CHECKLIST FROM ANNUAL WELLNESS VISIT:  -Follow up (please call to schedule if not scheduled after visit):   -yearly for annual wellness visit with primary care office  Here is a list of your preventive care/health maintenance measures and the plan for each if any are due:  Health Maintenance  Topic Date Due   Diabetic kidney evaluation - Urine ACR  03/23/2017   FOOT EXAM  09/13/2020   COVID-19 Vaccine (8 - 2023-24 season) 02/20/2023 (Originally 10/15/2022)   HEMOGLOBIN A1C  04/21/2023   Diabetic kidney evaluation - eGFR measurement  10/21/2023   OPHTHALMOLOGY EXAM  11/04/2023   Medicare Annual Wellness (AWV)  02/04/2024   MAMMOGRAM  05/07/2024   DTaP/Tdap/Td (3 - Td or Tdap) 08/02/2031   Pneumonia Vaccine 39+ Years old  Completed   INFLUENZA VACCINE  Completed   DEXA SCAN  Completed   Hepatitis C Screening  Completed   Zoster Vaccines- Shingrix  Completed   HPV VACCINES  Aged Out   COLONOSCOPY (Pts 45-56yrs Insurance coverage will need to be confirmed)  Discontinued    -See a dentist at least yearly  -Get your eyes checked and then per your eye specialist's recommendations  -Other issues addressed today:  -I have included below further information regarding a healthy whole foods based diet, physical activity guidelines for adults, stress management and opportunities for social connections. I hope you find this information useful.   -----------------------------------------------------------------------------------------------------------------------------------------------------------------------------------------------------------------------------------------------------------  NUTRITION: -eat real food: lots of colorful vegetables (half the plate) and fruits -5-7 servings of  vegetables and fruits per day (fresh or steamed is best), exp. 2 servings of vegetables with lunch and dinner and 2 servings of fruit per day. Berries and greens such as kale and collards are great choices.  -consume on a regular basis: whole grains (make sure first ingredient on label contains the word "whole"), fresh fruits, fish, nuts, seeds, healthy oils (such as olive oil, avocado oil, grape seed oil) -may eat small amounts of dairy and lean meat on occasion, but avoid processed meats such as ham, bacon, lunch meat, etc. -drink water -try to avoid fast food and pre-packaged foods, processed meat -most experts advise limiting sodium to < 2300mg  per day, should limit further is any chronic conditions such as high blood pressure, heart disease, diabetes, etc. The American Heart Association advised that < 1500mg  is is ideal -try to avoid foods that contain any ingredients with names you do not recognize  -try to avoid sugar/sweets (except for the natural sugar that occurs in fresh fruit) -try to avoid sweet drinks -try to avoid white rice, white bread, pasta (unless whole grain), white or yellow potatoes  EXERCISE GUIDELINES FOR ADULTS: -if you wish to increase your physical activity, do so gradually and with the approval of your doctor -STOP and seek medical care immediately if you have any chest pain, chest discomfort or trouble breathing when starting or increasing exercise  -move and stretch your body, legs, feet and arms when sitting for long periods  -Physical activity guidelines for optimal health in adults: -least 150 minutes per week of aerobic exercise (can talk, but not sing) once approved by your doctor, 20-30 minutes of sustained activity or two 10 minute episodes of sustained activity every day.  -resistance training at least 2 days per  week if approved by your doctor  -balance exercises 3+ days per week:   Stand somewhere where you have something sturdy to hold onto if you lose  balance.    1) lift up on toes, start with 5x per day and work up to 20x   2) stand and lift on leg straight out to the side so that foot is a few inches of the floor, start with 5x each side and work up to 20x each side   3) stand on one foot, start with 5 seconds each side and work up to 20 seconds on each side  If you need ideas or help with getting more active:  -Silver sneakers https://tools.silversneakers.com  -Walk with a Doc: http://stephens-thompson.biz/  -try to include resistance (weight lifting/strength building) and balance exercises twice per week: or the following link for ideas: ChessContest.fr  UpdateClothing.com.cy  STRESS MANAGEMENT: -can try meditating, or just sitting quietly with deep breathing while intentionally relaxing all parts of your body for 5 minutes daily -if you need further help with stress, anxiety or depression please follow up with your primary doctor or contact the wonderful folks at [place]: [place]: -options in [place] if you wish to engage in more social and exercise related activities:  -Silver sneakers https://tools.silversneakers.com  -Walk with a Doc: http://stephens-thompson.biz/  -Check out the [place] 50+ section on the [place] [employer] (hiking clubs, book clubs, cards and games, chess, exercise classes, aquatic classes and much more) - see the website for details: https://www.[place].gov/departments/parks-recreation/active-adults50  -YouTube has lots of exercise videos for different ages and abilities as well  -[place] (a variety of indoor and outdoor inperson activities for adults). (514)696-5226. [address].  -Virtual Online Classes (a variety of topics): see seniorplanet.org or call [phone]  -consider volunteering at a school, hospice  center, church, senior center or elsewhere  ADVANCED HEALTHCARE DIRECTIVES:  Everyone should have advanced health care directives in place. This is so that you get the care you want, should you ever be in a situation where you are unable to make your own medical decisions.   From the [place] Advanced Directive Website: "[place] are legal documents in which you give written instructions about your health care if, in the future, you cannot speak for yourself.   A health care power of attorney allows you to name a person you trust to make your health care decisions if you cannot make them yourself. A declaration of a desire for a natural death (or living will) is document, which states that you desire not to have your life prolonged by extraordinary measures if you have a terminal or incurable illness or if you are in a vegetative state. An advance instruction for mental health treatment makes a declaration of instructions, information and preferences regarding your mental health treatment. It also states that you are aware that the advance instruction authorizes a mental health treatment provider to act according to your wishes. It may also outline your consent or refusal of mental health treatment. A declaration of an anatomical gift allows anyone over the age of 88 to make a gift by will, organ donor card or other document."   Please see the following website or an elder law attorney for forms, FAQs and for completion of advanced directives: [place] Secretary of [place] ([email or web address])  Or copy and paste the following to your web browser: [email or web address]

## 2023-03-09 DIAGNOSIS — Z932 Ileostomy status: Secondary | ICD-10-CM | POA: Diagnosis not present

## 2023-03-09 DIAGNOSIS — K5190 Ulcerative colitis, unspecified, without complications: Secondary | ICD-10-CM | POA: Diagnosis not present

## 2023-03-09 DIAGNOSIS — T8131XD Disruption of external operation (surgical) wound, not elsewhere classified, subsequent encounter: Secondary | ICD-10-CM | POA: Diagnosis not present

## 2023-03-11 ENCOUNTER — Other Ambulatory Visit: Payer: Self-pay | Admitting: Internal Medicine

## 2023-03-11 DIAGNOSIS — Z932 Ileostomy status: Secondary | ICD-10-CM | POA: Diagnosis not present

## 2023-03-11 DIAGNOSIS — T8131XD Disruption of external operation (surgical) wound, not elsewhere classified, subsequent encounter: Secondary | ICD-10-CM | POA: Diagnosis not present

## 2023-03-11 DIAGNOSIS — I10 Essential (primary) hypertension: Secondary | ICD-10-CM

## 2023-03-11 DIAGNOSIS — K5190 Ulcerative colitis, unspecified, without complications: Secondary | ICD-10-CM | POA: Diagnosis not present

## 2023-03-30 NOTE — Progress Notes (Unsigned)
    Aleen Sells D.[facility] [address] [place] [address] Phone: [phone]   Assessment and Plan:     There are no diagnoses linked to this encounter.  ***   Pertinent previous records reviewed include ***   Follow Up: ***     Subjective:   I, Miliyah Luper, am serving as a Neurosurgeon for Doctor Richardean Sale   Chief Complaint: bilateral knee pain    HPI:    08/22/21 Patient is a 67 year old female presenting with bilateral knee pain worse when walking. Patient has had pain in both knees for several years but in the last year it has been worse. Patient locates pain to front on knee cap and the back of her knee feels like a constant aching worse when laying. Patient is a walker and once she gets going the pain will eventually subside. Stopped the meloxicam since she found out her GFR has dropped.    Radiates: yes down to the shins  Swelling: sometimes Mechanical symptoms: yes  Numbness/tingling: tingling pain that starts lateral R leg above the knee down the leg  Weakness: rarely, but in the right leg  Aggravates: sitting to standing, going up and down steps  Treatments tried: brace, meloxicam, tylenol,    04/21/2022 Patient states that her knee are hurting real bad wants a CSI    11/11/2022 Patient states is ready for an injection today    03/31/2023 Patient states   Relevant Historical Information: Hypertension, ulcerative colitis with history of resection  Additional pertinent review of systems negative.   Current Outpatient Medications:    ALPRAZolam (XANAX) 0.25 MG tablet, TAKE 1 TABLET BY MOUTH 2 TIMES DAILY AS NEEDED FOR ANXIETY., Disp: 60 tablet, Rfl: 2   cholecalciferol (VITAMIN D3) 25 MCG (1000 UNIT) tablet, Take 1,000 Units by mouth daily., Disp: , Rfl:    Cyanocobalamin (VITAMIN B-12 PO), Take 500 mcg by mouth daily., Disp: , Rfl:    hydrocortisone 2.5 % cream, Apply topically 2 (two) times daily., Disp: 30 g,  Rfl: 3   indapamide (LOZOL) 1.25 MG tablet, TAKE 1 TABLET BY MOUTH EVERY DAY, Disp: 90 tablet, Rfl: 0   loperamide (IMODIUM) 2 MG capsule, Take 2 mg by mouth as needed for diarrhea or loose stools., Disp: , Rfl:    losartan (COZAAR) 25 MG tablet, Take 25 mg by mouth at bedtime., Disp: , Rfl:    Multiple Vitamin (MULTIVITAMIN WITH MINERALS) TABS tablet, Take 1 tablet by mouth daily., Disp: , Rfl:    potassium chloride (KLOR-CON 10) 10 MEQ tablet, Take 1 tablet (10 mEq total) by mouth 3 (three) times daily., Disp: 270 tablet, Rfl: 0   rosuvastatin (CRESTOR) 10 MG tablet, TAKE 1 TABLET BY MOUTH EVERY DAY, Disp: 90 tablet, Rfl: 1   Objective:     There were no vitals filed for this visit.    There is no height or weight on file to calculate BMI.    Physical Exam:    ***   Electronically signed by:  Aleen Sells D.[facility] 3:40 PM 03/30/23

## 2023-03-31 ENCOUNTER — Ambulatory Visit (INDEPENDENT_AMBULATORY_CARE_PROVIDER_SITE_OTHER): Payer: Medicare Other | Admitting: Internal Medicine

## 2023-03-31 ENCOUNTER — Ambulatory Visit: Payer: Medicare Other | Admitting: Sports Medicine

## 2023-03-31 ENCOUNTER — Encounter: Payer: Self-pay | Admitting: Internal Medicine

## 2023-03-31 VITALS — BP 138/88 | HR 98 | Ht 62.0 in | Wt 182.0 lb

## 2023-03-31 VITALS — BP 138/88 | HR 76 | Temp 98.3°F | Resp 16 | Ht 62.0 in | Wt 182.2 lb

## 2023-03-31 DIAGNOSIS — I10 Essential (primary) hypertension: Secondary | ICD-10-CM | POA: Diagnosis not present

## 2023-03-31 DIAGNOSIS — M170 Bilateral primary osteoarthritis of knee: Secondary | ICD-10-CM

## 2023-03-31 DIAGNOSIS — M25561 Pain in right knee: Secondary | ICD-10-CM

## 2023-03-31 DIAGNOSIS — R9431 Abnormal electrocardiogram [ECG] [EKG]: Secondary | ICD-10-CM | POA: Diagnosis not present

## 2023-03-31 DIAGNOSIS — R7303 Prediabetes: Secondary | ICD-10-CM

## 2023-03-31 DIAGNOSIS — E785 Hyperlipidemia, unspecified: Secondary | ICD-10-CM

## 2023-03-31 DIAGNOSIS — E876 Hypokalemia: Secondary | ICD-10-CM | POA: Diagnosis not present

## 2023-03-31 DIAGNOSIS — Z0001 Encounter for general adult medical examination with abnormal findings: Secondary | ICD-10-CM | POA: Diagnosis not present

## 2023-03-31 DIAGNOSIS — R0609 Other forms of dyspnea: Secondary | ICD-10-CM | POA: Diagnosis not present

## 2023-03-31 DIAGNOSIS — M25562 Pain in left knee: Secondary | ICD-10-CM | POA: Diagnosis not present

## 2023-03-31 DIAGNOSIS — G8929 Other chronic pain: Secondary | ICD-10-CM

## 2023-03-31 DIAGNOSIS — N1831 Chronic kidney disease, stage 3a: Secondary | ICD-10-CM | POA: Diagnosis not present

## 2023-03-31 DIAGNOSIS — T502X5A Adverse effect of carbonic-anhydrase inhibitors, benzothiadiazides and other diuretics, initial encounter: Secondary | ICD-10-CM | POA: Diagnosis not present

## 2023-03-31 LAB — CBC WITH DIFFERENTIAL/PLATELET
Basophils Absolute: 0 10*3/uL (ref 0.0–0.1)
Basophils Relative: 0.4 % (ref 0.0–3.0)
Eosinophils Absolute: 0.2 10*3/uL (ref 0.0–0.7)
Eosinophils Relative: 1.9 % (ref 0.0–5.0)
HCT: 36.5 % (ref 36.0–46.0)
Hemoglobin: 12.1 g/dL (ref 12.0–15.0)
Lymphocytes Relative: 28.5 % (ref 12.0–46.0)
Lymphs Abs: 2.5 10*3/uL (ref 0.7–4.0)
MCHC: 33 g/dL (ref 30.0–36.0)
MCV: 86 fl (ref 78.0–100.0)
Monocytes Absolute: 0.6 10*3/uL (ref 0.1–1.0)
Monocytes Relative: 6.7 % (ref 3.0–12.0)
Neutro Abs: 5.4 10*3/uL (ref 1.4–7.7)
Neutrophils Relative %: 62.5 % (ref 43.0–77.0)
Platelets: 302 10*3/uL (ref 150.0–400.0)
RBC: 4.25 Mil/uL (ref 3.87–5.11)
RDW: 14.1 % (ref 11.5–15.5)
WBC: 8.6 10*3/uL (ref 4.0–10.5)

## 2023-03-31 LAB — URINALYSIS, ROUTINE W REFLEX MICROSCOPIC
Bilirubin Urine: NEGATIVE
Hgb urine dipstick: NEGATIVE
Ketones, ur: NEGATIVE
Leukocytes,Ua: NEGATIVE
Nitrite: NEGATIVE
RBC / HPF: NONE SEEN (ref 0–?)
Specific Gravity, Urine: >=1.03 — AB (ref 1.000–1.030)
Total Protein, Urine: NEGATIVE
Urine Glucose: NEGATIVE
Urobilinogen, UA: 0.2 (ref 0.0–1.0)
pH: 6 (ref 5.0–8.0)

## 2023-03-31 LAB — BASIC METABOLIC PANEL
BUN: 14 mg/dL (ref 6–23)
CO2: 31 mEq/L (ref 19–32)
Calcium: 9.5 mg/dL (ref 8.4–10.5)
Chloride: 101 mEq/L (ref 96–112)
Creatinine, Ser: 1.08 mg/dL (ref 0.40–1.20)
GFR: 52.95 mL/min — ABNORMAL LOW (ref >60.00)
Glucose, Bld: 85 mg/dL (ref 70–99)
Potassium: 3.8 mEq/L (ref 3.5–5.1)
Sodium: 138 mEq/L (ref 135–145)

## 2023-03-31 LAB — MAGNESIUM: Magnesium: 1.8 mg/dL (ref 1.5–2.5)

## 2023-03-31 LAB — MICROALBUMIN / CREATININE URINE RATIO
Creatinine,U: 144.2 mg/dL
Microalb Creat Ratio: 1.3 mg/g (ref 0.0–30.0)
Microalb, Ur: 1.9 mg/dL (ref 0.0–1.9)

## 2023-03-31 LAB — HEMOGLOBIN A1C: Hgb A1c MFr Bld: 6 % (ref 4.6–6.5)

## 2023-03-31 LAB — TROPONIN I (HIGH SENSITIVITY): High Sens Troponin I: 3 ng/L (ref 2–17)

## 2023-03-31 LAB — BRAIN NATRIURETIC PEPTIDE: Pro B Natriuretic peptide (BNP): 51 pg/mL (ref 0.0–100.0)

## 2023-03-31 MED ORDER — MELOXICAM 15 MG PO TABS: 15.0000 mg | ORAL_TABLET | Freq: Every day | ORAL | 0 refills | Status: DC | PRN

## 2023-03-31 NOTE — Progress Notes (Signed)
Subjective:  Patient ID: Latasha Wood, female    DOB: 07/30/1956  Age: 67 y.o. MRN: [id number]  CC: Hypertension and Annual Exam   HPI [name] presents for a CPX and f/up ---  She complains of DOE but denies CP, diaphoresis, or edema.  Outpatient Medications Prior to Visit  Medication Sig Dispense Refill   ALPRAZolam (XANAX) 0.25 MG tablet TAKE 1 TABLET BY MOUTH 2 TIMES DAILY AS NEEDED FOR ANXIETY. 60 tablet 2   atovaquone-proguanil (MALARONE) 250-100 MG TABS tablet Take 1 tablet by mouth daily.     cholecalciferol (VITAMIN D3) 25 MCG (1000 UNIT) tablet Take 1,000 Units by mouth daily.     Cyanocobalamin (VITAMIN B-12 PO) Take 500 mcg by mouth daily.     hydrocortisone 2.5 % cream Apply topically 2 (two) times daily. 30 g 3   indapamide (LOZOL) 1.25 MG tablet TAKE 1 TABLET BY MOUTH EVERY DAY 90 tablet 0   loperamide (IMODIUM) 2 MG capsule Take 2 mg by mouth as needed for diarrhea or loose stools.     losartan (COZAAR) 25 MG tablet Take 25 mg by mouth at bedtime.     Multiple Vitamin (MULTIVITAMIN WITH MINERALS) TABS tablet Take 1 tablet by mouth daily.     potassium chloride (KLOR-CON 10) 10 MEQ tablet Take 1 tablet (10 mEq total) by mouth 3 (three) times daily. 270 tablet 0   rosuvastatin (CRESTOR) 10 MG tablet TAKE 1 TABLET BY MOUTH EVERY DAY 90 tablet 1   No facility-administered medications prior to visit.    ROS Review of Systems  Constitutional: Negative.  Negative for diaphoresis and fatigue.  HENT: Negative.    Eyes: Negative.   Respiratory:  Positive for shortness of breath. Negative for cough, chest tightness and wheezing.   Cardiovascular:  Negative for chest pain, palpitations and leg swelling.  Gastrointestinal:  Negative for abdominal pain, diarrhea, nausea and vomiting.  Endocrine: Negative.   Genitourinary: Negative.  Negative for difficulty urinating.  Musculoskeletal:  Negative for arthralgias and myalgias.  Skin: Negative.   Neurological:  Negative.  Negative for dizziness and weakness.  Hematological:  Negative for adenopathy. Does not bruise/bleed easily.  Psychiatric/Behavioral:  Negative for confusion, decreased concentration and sleep disturbance. The patient is nervous/anxious.     Objective:  BP 138/88 (BP Location: Left Arm, Patient Position: Sitting, Cuff Size: Normal)   Pulse 76   Temp 98.3 F (36.8 C) (Oral)   Resp 16   Ht 5\' 2"  (1.575 m)   Wt 182 lb 3.2 oz (82.6 kg)   SpO2 98%   BMI 33.32 kg/m   BP Readings from Last 3 Encounters:  03/31/23 138/88  03/31/23 138/88  11/11/22 132/80    Wt Readings from Last 3 Encounters:  03/31/23 182 lb (82.6 kg)  03/31/23 182 lb 3.2 oz (82.6 kg)  11/11/22 190 lb (86.2 kg)    Physical Exam Vitals reviewed.  Constitutional:      Appearance: She is not ill-appearing.  HENT:     Nose: Nose normal.     Mouth/Throat:     Mouth: Mucous membranes are moist.  Eyes:     General: No scleral icterus.    Conjunctiva/sclera: Conjunctivae normal.  Cardiovascular:     Rate and Rhythm: Normal rate and regular rhythm.     Heart sounds: Normal heart sounds, S1 normal and S2 normal. No murmur heard.    Comments: EKG- NSR, 62 bpm LAE, low voltage in the anterior leads and III/aVF No  LVH Pulmonary:     Effort: Pulmonary effort is normal.     Breath sounds: No stridor. No wheezing, rhonchi or rales.  Abdominal:     General: Abdomen is flat.     Palpations: There is no mass.     Tenderness: There is no abdominal tenderness. There is no guarding.     Hernia: No hernia is present.  Musculoskeletal:        General: No swelling.     Cervical back: Neck supple.     Right lower leg: No edema.     Left lower leg: No edema.  Lymphadenopathy:     Cervical: No cervical adenopathy.  Skin:    General: Skin is warm and dry.  Neurological:     General: No focal deficit present.     Mental Status: She is alert. Mental status is at baseline.  Psychiatric:        Mood and Affect:  Mood normal.        Behavior: Behavior normal.        Thought Content: Thought content normal.        Judgment: Judgment normal.     Lab Results  Component Value Date   WBC 8.6 03/31/2023   HGB 12.1 03/31/2023   HCT 36.5 03/31/2023   PLT 302.0 03/31/2023   GLUCOSE 85 03/31/2023   CHOL 161 04/30/2022   TRIG 82.0 04/30/2022   HDL 101.80 04/30/2022   LDLDIRECT 114.0 04/29/2017   LDLCALC 43 04/30/2022   ALT 16 10/20/2022   AST 20 10/20/2022   NA 138 03/31/2023   K 3.8 03/31/2023   CL 101 03/31/2023   CREATININE 1.08 03/31/2023   BUN 14 03/31/2023   CO2 31 03/31/2023   TSH 1.44 10/20/2022   INR 0.9 10/20/2022   HGBA1C 6.0 03/31/2023   MICROALBUR 1.9 03/31/2023    DG Bone Density  Result Date: 10/15/2022 EXAM: DUAL X-RAY ABSORPTIOMETRY (DXA) FOR BONE MINERAL DENSITY IMPRESSION: Referring Physician:  Etta Grandchild Your patient completed a bone mineral density test using GE Lunar iDXA system (analysis version: 16). Technologist: lmn PATIENT: Name: Latasha, Wood Patient ID: [id number] Birth Date: 1956/08/31 Height: 63.8 in. Sex: Female Measured: 10/15/2022 Weight: 184.2 lbs. Indications: Estrogen Deficient, Hysterectomy, Postmenopausal, Ulcerative Colitis Fractures: NONE Treatments: Multivitamin, Vitamin D (E933.5) ASSESSMENT: The BMD measured at Femur Neck Left is 0.953 g/cm2 with a T-score of -0.6. This patient is considered normal according to World Health Organization [facility]) criteria. The quality of the exam is good. L3 was excluded due to degenerative changes. Site Region Measured Date Measured Age YA BMD Significant CHANGE T-score DualFemur Neck Left  9/15/20[age]         -0.5    0.953 g/cm2 AP Spine  L1-L4 (L3) 9/15/20[age]         0.5     1.245 g/cm2 DualFemur Total Mean 10[age]15/2021    [age]         -0.2    0.974 g/cm2 World Health Organization [facility]) criteria for post-menopausal, Caucasian Women: Normal       T-score at or above -1 SD Osteopenia   T-score between -1  and -2.5 SD Osteoporosis T-score at or below -2.5 SD RECOMMENDATION: 1. All patients should optimize calcium and vitamin D intake. 2. Consider FDA-approved medical therapies in postmenopausal women and men aged 1 years and older, based on the following: a. A hip or vertebral (clinical or morphometric) fracture. b. T-score = -  2.5 at the femoral neck or spine after appropriate evaluation to exclude secondary causes. c. Low bone mass (T-score between -1.0 and -2.5 at the femoral neck or spine) and a 10-year probability of a hip fracture = 3% or a 10-year probability of a major osteoporosis-related fracture = 20% based on the [place]-adapted WHO algorithm. d. Clinician judgment and/or patient preferences may indicate treatment for people with 10-year fracture probabilities above or below these levels. FOLLOW-UP: Patients with diagnosis of osteoporosis or at high risk for fracture should have regular bone mineral density tests. Patients eligible for Medicare are allowed routine testing every 2 years. The testing frequency can be increased to one year for patients who have rapidly progressing disease, are receiving or discontinuing medical therapy to restore bone mass, or have additional risk factors. I have reviewed this study and agree with the findings. [facility] Electronically Signed   By: Frederico Hamman M.D.   On: 10/15/2022 08:39    Assessment & Plan:   Diuretic-induced hypokalemia -     Basic metabolic panel; Future -     Magnesium; Future  Essential hypertension - Her BP is well controlled. -     Basic metabolic panel; Future -     Magnesium; Future -     CBC with Differential/Platelet; Future  Prediabetes -     Hemoglobin A1c; Future  Stage 3a chronic kidney disease (HCC)- Her renal function is stable. -     Microalbumin / creatinine urine ratio; Future -     Urinalysis, Routine w reflex microscopic; Future -     Basic metabolic panel; Future  DOE (dyspnea on exertion)- Labs  are reassuring.  I recommended that she undergo a coronary calcium score to screen for CAD and an echocardiogram to evaluate for wall motion abnormalities and CHF. -     EKG 12-Lead -     Troponin I (High Sensitivity); Future -     Brain natriuretic peptide; Future -     CBC with Differential/Platelet; Future -     ECHOCARDIOGRAM COMPLETE; Future -     CT CARDIAC SCORING (SELF PAY ONLY); Future  Encounter for general adult medical examination with abnormal findings- Exam completed, labs reviewed, vaccines are UTD, cancer screenings are up-to-date, patient education was given.  Abnormal electrocardiogram (ECG) (EKG) -     ECHOCARDIOGRAM COMPLETE; Future -     CT CARDIAC SCORING (SELF PAY ONLY); Future  Hyperlipidemia LDL goal <70- LDL goal achieved. Doing well on the statin      Follow-up: Return in about 6 months (around 10/01/2023).  Sanda Linger, MD

## 2023-03-31 NOTE — Patient Instructions (Signed)

## 2023-03-31 NOTE — Patient Instructions (Signed)
Good to see you  Have a great trip  As needed follow up

## 2023-04-06 ENCOUNTER — Telehealth (HOSPITAL_BASED_OUTPATIENT_CLINIC_OR_DEPARTMENT_OTHER): Payer: Self-pay | Admitting: *Deleted

## 2023-04-06 NOTE — Telephone Encounter (Signed)
Left message for patient to call and schedule the Echocardiogram ordered by Dr. Thomas Jones 

## 2023-04-09 NOTE — Telephone Encounter (Signed)
Left message for patient to call and schedule the Echocardiogram ordered bu Dr. Sanda Linger

## 2023-05-12 ENCOUNTER — Other Ambulatory Visit: Payer: Self-pay | Admitting: Sports Medicine

## 2023-06-08 ENCOUNTER — Other Ambulatory Visit: Payer: Self-pay | Admitting: Internal Medicine

## 2023-06-08 DIAGNOSIS — I10 Essential (primary) hypertension: Secondary | ICD-10-CM

## 2023-06-16 ENCOUNTER — Other Ambulatory Visit: Payer: Self-pay | Admitting: Internal Medicine

## 2023-06-16 DIAGNOSIS — E785 Hyperlipidemia, unspecified: Secondary | ICD-10-CM

## 2023-07-05 ENCOUNTER — Encounter: Payer: Self-pay | Admitting: Internal Medicine

## 2023-07-05 DIAGNOSIS — E785 Hyperlipidemia, unspecified: Secondary | ICD-10-CM

## 2023-07-05 DIAGNOSIS — I10 Essential (primary) hypertension: Secondary | ICD-10-CM

## 2023-07-05 MED ORDER — ROSUVASTATIN CALCIUM 10 MG PO TABS
10.0000 mg | ORAL_TABLET | Freq: Every day | ORAL | 0 refills | Status: DC
Start: 2023-07-05 — End: 2023-07-14

## 2023-07-05 MED ORDER — INDAPAMIDE 1.25 MG PO TABS
1.2500 mg | ORAL_TABLET | Freq: Every day | ORAL | 0 refills | Status: DC
Start: 2023-07-05 — End: 2023-07-14

## 2023-07-14 ENCOUNTER — Encounter: Payer: Self-pay | Admitting: Internal Medicine

## 2023-07-14 ENCOUNTER — Other Ambulatory Visit: Payer: Self-pay | Admitting: Sports Medicine

## 2023-07-14 ENCOUNTER — Other Ambulatory Visit: Payer: Self-pay | Admitting: Internal Medicine

## 2023-07-14 DIAGNOSIS — I10 Essential (primary) hypertension: Secondary | ICD-10-CM

## 2023-07-14 DIAGNOSIS — E785 Hyperlipidemia, unspecified: Secondary | ICD-10-CM

## 2023-07-15 ENCOUNTER — Other Ambulatory Visit: Payer: Self-pay | Admitting: Internal Medicine

## 2023-07-15 DIAGNOSIS — F411 Generalized anxiety disorder: Secondary | ICD-10-CM

## 2023-07-15 DIAGNOSIS — L2084 Intrinsic (allergic) eczema: Secondary | ICD-10-CM

## 2023-07-15 MED ORDER — HYDROCORTISONE 2.5 % EX CREA
TOPICAL_CREAM | Freq: Two times a day (BID) | CUTANEOUS | 3 refills | Status: DC
Start: 2023-07-15 — End: 2024-03-10

## 2023-07-15 MED ORDER — ALPRAZOLAM 0.25 MG PO TABS: 0.2500 mg | ORAL_TABLET | Freq: Two times a day (BID) | ORAL | 2 refills | Status: DC | PRN

## 2023-10-18 ENCOUNTER — Other Ambulatory Visit: Payer: Self-pay | Admitting: Internal Medicine

## 2023-10-18 DIAGNOSIS — Z1231 Encounter for screening mammogram for malignant neoplasm of breast: Secondary | ICD-10-CM

## 2023-10-26 ENCOUNTER — Other Ambulatory Visit (HOSPITAL_BASED_OUTPATIENT_CLINIC_OR_DEPARTMENT_OTHER): Payer: Medicare Other

## 2023-11-27 ENCOUNTER — Other Ambulatory Visit: Payer: Self-pay | Admitting: Internal Medicine

## 2023-11-27 DIAGNOSIS — F411 Generalized anxiety disorder: Secondary | ICD-10-CM

## 2023-12-04 ENCOUNTER — Encounter: Payer: Self-pay | Admitting: Internal Medicine

## 2023-12-06 ENCOUNTER — Other Ambulatory Visit: Payer: Self-pay

## 2023-12-06 DIAGNOSIS — F411 Generalized anxiety disorder: Secondary | ICD-10-CM

## 2023-12-11 ENCOUNTER — Other Ambulatory Visit: Payer: Self-pay | Admitting: Internal Medicine

## 2023-12-11 DIAGNOSIS — F411 Generalized anxiety disorder: Secondary | ICD-10-CM

## 2023-12-14 ENCOUNTER — Other Ambulatory Visit: Payer: Self-pay | Admitting: Internal Medicine

## 2023-12-14 DIAGNOSIS — I10 Essential (primary) hypertension: Secondary | ICD-10-CM

## 2023-12-14 DIAGNOSIS — E785 Hyperlipidemia, unspecified: Secondary | ICD-10-CM

## 2024-02-14 DIAGNOSIS — H40003 Preglaucoma, unspecified, bilateral: Secondary | ICD-10-CM | POA: Diagnosis not present

## 2024-02-15 ENCOUNTER — Ambulatory Visit
Admission: RE | Admit: 2024-02-15 | Discharge: 2024-02-15 | Disposition: A | Discharge status: Home / Self Care | Payer: Medicare Other | Source: Ambulatory Visit | Attending: Internal Medicine | Admitting: Internal Medicine

## 2024-02-15 DIAGNOSIS — Z1231 Encounter for screening mammogram for malignant neoplasm of breast: Secondary | ICD-10-CM | POA: Diagnosis not present

## 2024-02-23 DIAGNOSIS — H40003 Preglaucoma, unspecified, bilateral: Secondary | ICD-10-CM | POA: Diagnosis not present

## 2024-02-23 DIAGNOSIS — R7303 Prediabetes: Secondary | ICD-10-CM | POA: Diagnosis not present

## 2024-02-23 DIAGNOSIS — Z961 Presence of intraocular lens: Secondary | ICD-10-CM | POA: Diagnosis not present

## 2024-02-23 DIAGNOSIS — H40059 Ocular hypertension, unspecified eye: Secondary | ICD-10-CM | POA: Diagnosis not present

## 2024-02-23 LAB — HM DIABETES EYE EXAM

## 2024-02-24 ENCOUNTER — Encounter: Payer: Self-pay | Admitting: Internal Medicine

## 2024-02-29 DIAGNOSIS — H40003 Preglaucoma, unspecified, bilateral: Secondary | ICD-10-CM | POA: Diagnosis not present

## 2024-03-02 DIAGNOSIS — E041 Nontoxic single thyroid nodule: Secondary | ICD-10-CM | POA: Diagnosis not present

## 2024-03-06 ENCOUNTER — Ambulatory Visit: Admitting: Internal Medicine

## 2024-03-06 ENCOUNTER — Encounter: Payer: Self-pay | Admitting: Internal Medicine

## 2024-03-06 VITALS — BP 148/86 | HR 76 | Temp 98.6°F | Resp 16 | Ht 63.0 in | Wt 166.0 lb

## 2024-03-06 DIAGNOSIS — L2084 Intrinsic (allergic) eczema: Secondary | ICD-10-CM | POA: Diagnosis not present

## 2024-03-06 DIAGNOSIS — K51019 Ulcerative (chronic) pancolitis with unspecified complications: Secondary | ICD-10-CM

## 2024-03-06 DIAGNOSIS — N1831 Chronic kidney disease, stage 3a: Secondary | ICD-10-CM

## 2024-03-06 DIAGNOSIS — I10 Essential (primary) hypertension: Secondary | ICD-10-CM

## 2024-03-06 DIAGNOSIS — M0760 Enteropathic arthropathies, unspecified site: Secondary | ICD-10-CM | POA: Diagnosis not present

## 2024-03-06 DIAGNOSIS — E876 Hypokalemia: Secondary | ICD-10-CM | POA: Diagnosis not present

## 2024-03-06 DIAGNOSIS — F411 Generalized anxiety disorder: Secondary | ICD-10-CM

## 2024-03-06 DIAGNOSIS — T502X5A Adverse effect of carbonic-anhydrase inhibitors, benzothiadiazides and other diuretics, initial encounter: Secondary | ICD-10-CM | POA: Diagnosis not present

## 2024-03-06 DIAGNOSIS — Z932 Ileostomy status: Secondary | ICD-10-CM

## 2024-03-06 DIAGNOSIS — E785 Hyperlipidemia, unspecified: Secondary | ICD-10-CM

## 2024-03-06 DIAGNOSIS — K51918 Ulcerative colitis, unspecified with other complication: Secondary | ICD-10-CM

## 2024-03-06 DIAGNOSIS — R748 Abnormal levels of other serum enzymes: Secondary | ICD-10-CM

## 2024-03-06 DIAGNOSIS — R10816 Epigastric abdominal tenderness: Secondary | ICD-10-CM | POA: Insufficient documentation

## 2024-03-06 DIAGNOSIS — R7303 Prediabetes: Secondary | ICD-10-CM

## 2024-03-06 DIAGNOSIS — R0609 Other forms of dyspnea: Secondary | ICD-10-CM

## 2024-03-06 DIAGNOSIS — H9311 Tinnitus, right ear: Secondary | ICD-10-CM

## 2024-03-06 DIAGNOSIS — E041 Nontoxic single thyroid nodule: Secondary | ICD-10-CM

## 2024-03-06 DIAGNOSIS — R072 Precordial pain: Secondary | ICD-10-CM

## 2024-03-06 LAB — MICROALBUMIN / CREATININE URINE RATIO
Creatinine,U: 89 mg/dL
Microalb Creat Ratio: UNDETERMINED mg/g (ref 0.0–30.0)
Microalb, Ur: 0.7 mg/dL (ref 0.0–1.9)

## 2024-03-06 LAB — BASIC METABOLIC PANEL WITH GFR
BUN: 16 mg/dL (ref 6–23)
CO2: 31 meq/L (ref 19–32)
Calcium: 9.6 mg/dL (ref 8.4–10.5)
Chloride: 99 meq/L (ref 96–112)
Creatinine, Ser: 1.12 mg/dL (ref 0.40–1.20)
GFR: 50.36 mL/min — ABNORMAL LOW (ref >60.00)
Glucose, Bld: 96 mg/dL (ref 70–99)
Potassium: 3.9 meq/L (ref 3.5–5.1)
Sodium: 137 meq/L (ref 135–145)

## 2024-03-06 LAB — URINALYSIS, ROUTINE W REFLEX MICROSCOPIC
Bilirubin Urine: NEGATIVE
Hgb urine dipstick: NEGATIVE
Ketones, ur: NEGATIVE
Leukocytes,Ua: NEGATIVE
Nitrite: NEGATIVE
RBC / HPF: NONE SEEN (ref 0–?)
Specific Gravity, Urine: 1.015 (ref 1.000–1.030)
Total Protein, Urine: NEGATIVE
Urine Glucose: NEGATIVE
Urobilinogen, UA: 0.2 (ref 0.0–1.0)
WBC, UA: NONE SEEN (ref 0–?)
pH: 6 (ref 5.0–8.0)

## 2024-03-06 LAB — HEPATIC FUNCTION PANEL
ALT: 30 U/L (ref 0–35)
AST: 32 U/L (ref 0–37)
Albumin: 4 g/dL (ref 3.5–5.2)
Alkaline Phosphatase: 69 U/L (ref 39–117)
Bilirubin, Direct: 0.2 mg/dL (ref 0.0–0.3)
Total Bilirubin: 0.7 mg/dL (ref 0.2–1.2)
Total Protein: 7.3 g/dL (ref 6.0–8.3)

## 2024-03-06 LAB — CBC WITH DIFFERENTIAL/PLATELET
Basophils Absolute: 0 10*3/uL (ref 0.0–0.1)
Basophils Relative: 0.6 % (ref 0.0–3.0)
Eosinophils Absolute: 0.2 10*3/uL (ref 0.0–0.7)
Eosinophils Relative: 2.9 % (ref 0.0–5.0)
HCT: 36.5 % (ref 36.0–46.0)
Hemoglobin: 12.1 g/dL (ref 12.0–15.0)
Lymphocytes Relative: 25.3 % (ref 12.0–46.0)
Lymphs Abs: 1.9 10*3/uL (ref 0.7–4.0)
MCHC: 33.1 g/dL (ref 30.0–36.0)
MCV: 87.4 fl (ref 78.0–100.0)
Monocytes Absolute: 0.5 10*3/uL (ref 0.1–1.0)
Monocytes Relative: 6.9 % (ref 3.0–12.0)
Neutro Abs: 4.8 10*3/uL (ref 1.4–7.7)
Neutrophils Relative %: 64.3 % (ref 43.0–77.0)
Platelets: 305 10*3/uL (ref 150.0–400.0)
RBC: 4.17 Mil/uL (ref 3.87–5.11)
RDW: 13.7 % (ref 11.5–15.5)
WBC: 7.5 10*3/uL (ref 4.0–10.5)

## 2024-03-06 LAB — LIPID PANEL
Cholesterol: 171 mg/dL (ref 0–200)
HDL: 110 mg/dL (ref >39.00)
LDL Cholesterol: 48 mg/dL (ref 0–99)
NonHDL: 61.12
Total CHOL/HDL Ratio: 2
Triglycerides: 68 mg/dL (ref 0.0–149.0)
VLDL: 13.6 mg/dL (ref 0.0–40.0)

## 2024-03-06 LAB — HEMOGLOBIN A1C: Hgb A1c MFr Bld: 5.9 % (ref 4.6–6.5)

## 2024-03-06 LAB — LIPASE: Lipase: 50 U/L (ref 11.0–59.0)

## 2024-03-06 LAB — TSH: TSH: 1 u[IU]/mL (ref 0.35–5.50)

## 2024-03-06 LAB — CK: Total CK: 271 U/L — ABNORMAL HIGH (ref 7–177)

## 2024-03-06 MED ORDER — AMLODIPINE BESYLATE 5 MG PO TABS: 5.0000 mg | ORAL_TABLET | Freq: Every day | ORAL | 0 refills | Status: DC

## 2024-03-06 MED ORDER — EMPAGLIFLOZIN 10 MG PO TABS: 10.0000 mg | ORAL_TABLET | Freq: Every day | ORAL | 1 refills | Status: DC

## 2024-03-06 MED ORDER — ALPRAZOLAM 0.25 MG PO TABS
0.2500 mg | ORAL_TABLET | Freq: Two times a day (BID) | ORAL | 0 refills | Status: DC | PRN
Start: 2024-03-06 — End: 2024-07-06

## 2024-03-06 NOTE — Progress Notes (Signed)
 Subjective:  Patient ID: Latasha Wood, female    DOB: [birth year]  Age: 68 y.o. MRN: [id number]  CC: Hypertension, Hyperlipidemia, and Diabetes   HPI Latasha Wood presents for f/up ---  Discussed the use of AI scribe software for clinical note transcription with the patient, who gave verbal consent to proceed.  History of Present Illness   Latasha Wood is a 68 year old female with hypertension and prediabetes who presents for follow-up on her chronic conditions and medication management.  She has been experiencing difficulty in obtaining her regular medications due to frequent travel, including trips to [place] , [place] , and [place]. This has led her to take her medications every other day to avoid running out, resulting in headaches and elevated blood pressure. She also experiences chest pain and shortness of breath. She was unable to complete a scheduled echocardiogram due to travel commitments.  She has lost weight while in [place] due to dietary changes but has since gained four pounds upon returning to the [place] . No symptoms of ulcerative colitis, but she reports upper abdominal pain and discomfort, possibly related to acid reflux, for the past two to three months.  She has a right thyroid  nodule with no recent growth observed by her ENT, and no biopsy was recommended unless discomfort arises. She experiences drainage and congestion, with a family history of throat cancer. She reports a sore throat, nasal congestion, and slight phlegm without sinus pain or blood. She has noticed a loss of hearing, particularly in her right ear, which was previously diagnosed in 2017.  She experiences anxiety, which she attributes to constant pain from various conditions, including arthritis-like symptoms in her knees and muscles. She has been taking Xanax  occasionally, with the last dose taken yesterday.  She reports skin changes on her breasts, described as pigment  variations and flaking patches, but her mammogram was normal.  She requires a prescription for her colostomy supplies due to changes in her insurance provider's requirements.       Outpatient Medications Prior to Visit  Medication Sig Dispense Refill   atovaquone-proguanil (MALARONE) 250-100 MG TABS tablet Take 1 tablet by mouth daily.     cholecalciferol (VITAMIN D3) 25 MCG (1000 UNIT) tablet Take 1,000 Units by mouth daily.     Cyanocobalamin  (VITAMIN B-12 PO) Take 500 mcg by mouth daily.     hydrocortisone  2.5 % cream Apply topically 2 (two) times daily. 30 g 3   indapamide  (LOZOL ) 1.25 MG tablet TAKE 1 TABLET BY MOUTH DAILY 100 tablet 0   loperamide  (IMODIUM ) 2 MG capsule Take 2 mg by mouth as needed for diarrhea or loose stools.     losartan (COZAAR) 25 MG tablet Take 25 mg by mouth at bedtime.     meloxicam  (MOBIC ) 15 MG tablet Take 1 tablet (15 mg total) by mouth daily as needed for pain. 60 tablet 0   Multiple Vitamin (MULTIVITAMIN WITH MINERALS) TABS tablet Take 1 tablet by mouth daily.     potassium chloride  (KLOR-CON  10) 10 MEQ tablet Take 1 tablet (10 mEq total) by mouth 3 (three) times daily. 270 tablet 0   ALPRAZolam  (XANAX ) 0.25 MG tablet Take 1 tablet (0.25 mg total) by mouth 2 (two) times daily as needed for anxiety. 60 tablet 2   rosuvastatin  (CRESTOR ) 10 MG tablet TAKE 1 TABLET BY MOUTH DAILY 100 tablet 0   No facility-administered medications prior to visit.    ROS Review of Systems  Constitutional:  Positive for unexpected  weight change (wt loss). Negative for appetite change, chills, diaphoresis and fatigue.  HENT: Negative.    Eyes: Negative.   Respiratory:  Positive for shortness of breath. Negative for cough, choking, chest tightness and wheezing.   Cardiovascular:  Positive for chest pain. Negative for palpitations and leg swelling.  Gastrointestinal:  Positive for abdominal pain. Negative for blood in stool, constipation, diarrhea, nausea and vomiting.   Endocrine: Negative.   Genitourinary: Negative.  Negative for difficulty urinating.  Musculoskeletal:  Positive for arthralgias and myalgias. Negative for back pain, joint swelling and neck pain.  Skin: Negative.   Neurological: Negative.  Negative for dizziness, weakness, light-headedness and headaches.  Hematological:  Negative for adenopathy. Does not bruise/bleed easily.  Psychiatric/Behavioral:  Positive for confusion and decreased concentration. Negative for dysphoric mood, sleep disturbance and suicidal ideas. The patient is nervous/anxious.     Objective:  BP (!) 148/86 (BP Location: Left Arm, Patient Position: Sitting, Cuff Size: Normal)   Pulse 76   Temp 98.6 F (37 C) (Temporal)   Resp 16   Ht 5\' 3"  (1.6 m)   Wt 166 lb (75.3 kg)   SpO2 99%   BMI 29.41 kg/m   BP Readings from Last 3 Encounters:  03/06/24 (!) 148/86  03/31/23 138/88  03/31/23 138/88    Wt Readings from Last 3 Encounters:  03/06/24 166 lb (75.3 kg)  03/31/23 182 lb (82.6 kg)  03/31/23 182 lb 3.2 oz (82.6 kg)    Physical Exam Vitals reviewed.  Constitutional:      Appearance: Normal appearance.  HENT:     Mouth/Throat:     Mouth: Mucous membranes are moist.  Eyes:     General: No scleral icterus.    Conjunctiva/sclera: Conjunctivae normal.  Neck:     Thyroid : Thyromegaly present. No thyroid  mass or thyroid  tenderness.  Cardiovascular:     Rate and Rhythm: Normal rate and regular rhythm.     Heart sounds: No murmur heard.    No friction rub. No gallop.     Comments: EKG----  NSR, 61 bpm No LVH, Q waves, or ST/T wave changes  Pulmonary:     Effort: Pulmonary effort is normal.     Breath sounds: No stridor. No wheezing, rhonchi or rales.  Abdominal:     General: Bowel sounds are normal. There is no distension.     Palpations: There is no hepatomegaly, splenomegaly or mass.     Tenderness: There is abdominal tenderness in the right upper quadrant and epigastric area. There is no  guarding.     Hernia: No hernia is present.  Musculoskeletal:        General: Normal range of motion.     Cervical back: Neck supple.     Right lower leg: No edema.     Left lower leg: No edema.  Lymphadenopathy:     Cervical: No cervical adenopathy.  Skin:    General: Skin is warm and dry.  Neurological:     General: No focal deficit present.     Mental Status: She is alert. Mental status is at baseline.  Psychiatric:        Mood and Affect: Mood normal.        Behavior: Behavior normal.     Lab Results  Component Value Date   WBC 7.5 03/06/2024   HGB 12.1 03/06/2024   HCT 36.5 03/06/2024   PLT 305.0 03/06/2024   GLUCOSE 96 03/06/2024   CHOL 171 03/06/2024   TRIG 68.0 03/06/2024  HDL 110.00 03/06/2024   LDLDIRECT 114.0 04/29/2017   LDLCALC 48 03/06/2024   ALT 30 03/06/2024   AST 32 03/06/2024   NA 137 03/06/2024   K 3.9 03/06/2024   CL 99 03/06/2024   CREATININE 1.12 03/06/2024   BUN 16 03/06/2024   CO2 31 03/06/2024   TSH 1.00 03/06/2024   INR 0.9 10/20/2022   HGBA1C 5.9 03/06/2024   MICROALBUR 0.7 03/06/2024    MM 3D SCREENING MAMMOGRAM BILATERAL BREAST Result Date: 02/17/2024 CLINICAL DATA:  Screening. EXAM: DIGITAL SCREENING BILATERAL MAMMOGRAM WITH TOMOSYNTHESIS AND CAD TECHNIQUE: Bilateral screening digital craniocaudal and mediolateral oblique mammograms were obtained. Bilateral screening digital breast tomosynthesis was performed. The images were evaluated with computer-aided detection. COMPARISON:  Previous exam(s). ACR Breast Density Category b: There are scattered areas of fibroglandular density. FINDINGS: There are no findings suspicious for malignancy. IMPRESSION: No mammographic evidence of malignancy. A result letter of this screening mammogram will be mailed directly to the patient. RECOMMENDATION: Screening mammogram in one year. (Code:SM-B-01Y) BI-RADS CATEGORY  1: Negative. Electronically Signed   By: Rinda Cheers M.D.   On: 02/17/2024 11:38     Assessment & Plan:   Essential hypertension- BP is not at goal. Will start a CCB -     TSH; Future -     Urinalysis, Routine w reflex microscopic; Future -     CBC with Differential/Platelet; Future -     EKG 12-Lead -     amLODIPine  Besylate; Take 1 tablet (5 mg total) by mouth daily.  Dispense: 90 tablet; Refill: 0 -     AMB Referral [facility] Care Management  Hyperlipidemia LDL goal <70- CK is elevated will hold the statin. -     TSH; Future -     Hepatic function panel; Future -     Lipid panel; Future -     CK; Future  Prediabetes -     Hemoglobin A1c; Future -     Basic metabolic panel with GFR; Future -     HM Diabetes Foot Exam  Stage 3a chronic kidney disease (HCC)- Will avoid nephrotoxic agents  -     Microalbumin / creatinine urine ratio; Future -     Urinalysis, Routine w reflex microscopic; Future -     Basic metabolic panel with GFR; Future -     Empagliflozin ; Take 1 tablet (10 mg total) by mouth daily before breakfast.  Dispense: 90 tablet; Refill: 1 -     AMB Referral [facility] Care Management  Ulcerative pancolitis with complication (HCC) -     Hepatic function panel; Future -     Basic metabolic panel with GFR; Future -     CBC with Differential/Platelet; Future -     Ambulatory referral to Gastroenterology -     Amb Referral to Ostomy Clinic  Epigastric abdominal tenderness without rebound tenderness -     Lipase; Future  Ileostomy in place [facility]) -     Ambulatory referral to Gastroenterology -     Amb Referral to Ostomy Clinic  Tinnitus of right ear -     Ambulatory referral to ENT  GAD (generalized anxiety disorder) -     ALPRAZolam ; Take 1 tablet (0.25 mg total) by mouth 2 (two) times daily as needed for anxiety.  Dispense: 180 tablet; Refill: 0  Arthropathy in ulcerative colitis with other complication (HCC) -     CK; Future -     Ambulatory referral to Rheumatology  Right thyroid  nodule -  Ambulatory referral to ENT  DOE (dyspnea on  exertion) -     CT CORONARY MORPH W/CTA COR W/SCORE W/CA W/CM &/OR WO/CM; Future  Precordial pain -     CT CORONARY MORPH W/CTA COR W/SCORE W/CA W/CM &/OR WO/CM; Future  Abnormal CK -     Ambulatory referral to Rheumatology  Other orders -     EKG     Follow-up: Return in about 3 months (around 06/05/2024).  Sandra Crouch, MD

## 2024-03-06 NOTE — Patient Instructions (Signed)
 Hypertension, Adult High blood pressure (hypertension) is when the force of blood pumping through the arteries is too strong. The arteries are the blood vessels that carry blood from the heart throughout the body. Hypertension forces the heart to work harder to pump blood and may cause arteries to become narrow or stiff. Untreated or uncontrolled hypertension can lead to a heart attack, heart failure, a stroke, kidney disease, and other problems. A blood pressure reading consists of a higher number over a lower number. Ideally, your blood pressure should be below 120/80. The first ("top") number is called the systolic pressure. It is a measure of the pressure in your arteries as your heart beats. The second ("bottom") number is called the diastolic pressure. It is a measure of the pressure in your arteries as the heart relaxes. What are the causes? The exact cause of this condition is not known. There are some conditions that result in high blood pressure. What increases the risk? Certain factors may make you more likely to develop high blood pressure. Some of these risk factors are under your control, including: Smoking. Not getting enough exercise or physical activity. Being overweight. Having too much fat, sugar, calories, or salt (sodium) in your diet. Drinking too much alcohol. Other risk factors include: Having a personal history of heart disease, diabetes, high cholesterol, or kidney disease. Stress. Having a family history of high blood pressure and high cholesterol. Having obstructive sleep apnea. Age. The risk increases with age. What are the signs or symptoms? High blood pressure may not cause symptoms. Very high blood pressure (hypertensive crisis) may cause: Headache. Fast or irregular heartbeats (palpitations). Shortness of breath. Nosebleed. Nausea and vomiting. Vision changes. Severe chest pain, dizziness, and seizures. How is this diagnosed? This condition is diagnosed by  measuring your blood pressure while you are seated, with your arm resting on a flat surface, your legs uncrossed, and your feet flat on the floor. The cuff of the blood pressure monitor will be placed directly against the skin of your upper arm at the level of your heart. Blood pressure should be measured at least twice using the same arm. Certain conditions can cause a difference in blood pressure between your right and left arms. If you have a high blood pressure reading during one visit or you have normal blood pressure with other risk factors, you may be asked to: Return on a different day to have your blood pressure checked again. Monitor your blood pressure at home for 1 week or longer. If you are diagnosed with hypertension, you may have other blood or imaging tests to help your health care provider understand your overall risk for other conditions. How is this treated? This condition is treated by making healthy lifestyle changes, such as eating healthy foods, exercising more, and reducing your alcohol intake. You may be referred for counseling on a healthy diet and physical activity. Your health care provider may prescribe medicine if lifestyle changes are not enough to get your blood pressure under control and if: Your systolic blood pressure is above 130. Your diastolic blood pressure is above 80. Your personal target blood pressure may vary depending on your medical conditions, your age, and other factors. Follow these instructions at home: Eating and drinking  Eat a diet that is high in fiber and potassium, and low in sodium, added sugar, and fat. An example of this eating plan is called the DASH diet. DASH stands for Dietary Approaches to Stop Hypertension. To eat this way: Eat  plenty of fresh fruits and vegetables. Try to fill one half of your plate at each meal with fruits and vegetables. Eat whole grains, such as whole-wheat pasta, brown rice, or whole-grain bread. Fill about one  fourth of your plate with whole grains. Eat or drink low-fat dairy products, such as skim milk or low-fat yogurt. Avoid fatty cuts of meat, processed or cured meats, and poultry with skin. Fill about one fourth of your plate with lean proteins, such as fish, chicken without skin, beans, eggs, or tofu. Avoid pre-made and processed foods. These tend to be higher in sodium, added sugar, and fat. Reduce your daily sodium intake. Many people with hypertension should eat less than 1,500 mg of sodium a day. Do not drink alcohol if: Your health care provider tells you not to drink. You are pregnant, may be pregnant, or are planning to become pregnant. If you drink alcohol: Limit how much you have to: 0-1 drink a day for women. 0-2 drinks a day for men. Know how much alcohol is in your drink. In the [place] one drink equals one 12 oz bottle of beer (355 mL), one 5 oz glass of wine (148 mL), or one 1 oz glass of hard liquor (44 mL). Lifestyle  Work with your health care provider to maintain a healthy body weight or to lose weight. Ask what an ideal weight is for you. Get at least 30 minutes of exercise that causes your heart to beat faster (aerobic exercise) most days of the week. Activities may include walking, swimming, or biking. Include exercise to strengthen your muscles (resistance exercise), such as Pilates or lifting weights, as part of your weekly exercise routine. Try to do these types of exercises for 30 minutes at least 3 days a week. Do not use any products that contain nicotine or tobacco. These products include cigarettes, chewing tobacco, and vaping devices, such as e-cigarettes. If you need help quitting, ask your health care provider. Monitor your blood pressure at home as told by your health care provider. Keep all follow-up visits. This is important. Medicines Take over-the-counter and prescription medicines only as told by your health care provider. Follow directions carefully. Blood  pressure medicines must be taken as prescribed. Do not skip doses of blood pressure medicine. Doing this puts you at risk for problems and can make the medicine less effective. Ask your health care provider about side effects or reactions to medicines that you should watch for. Contact a health care provider if you: Think you are having a reaction to a medicine you are taking. Have headaches that keep coming back (recurring). Feel dizzy. Have swelling in your ankles. Have trouble with your vision. Get help right away if you: Develop a severe headache or confusion. Have unusual weakness or numbness. Feel faint. Have severe pain in your chest or abdomen. Vomit repeatedly. Have trouble breathing. These symptoms may be an emergency. Get help right away. Call 911. Do not wait to see if the symptoms will go away. Do not drive yourself to the hospital. Summary Hypertension is when the force of blood pumping through your arteries is too strong. If this condition is not controlled, it may put you at risk for serious complications. Your personal target blood pressure may vary depending on your medical conditions, your age, and other factors. For most people, a normal blood pressure is less than 120/80. Hypertension is treated with lifestyle changes, medicines, or a combination of both. Lifestyle changes include losing weight, eating a healthy,  low-sodium diet, exercising more, and limiting alcohol. This information is not intended to replace advice given to you by your health care provider. Make sure you discuss any questions you have with your health care provider. Document Revised: 09/02/2021 Document Reviewed: 09/02/2021 Elsevier Patient Education  2024 [employer].

## 2024-03-07 ENCOUNTER — Other Ambulatory Visit: Payer: Self-pay | Admitting: Internal Medicine

## 2024-03-07 ENCOUNTER — Telehealth: Payer: Self-pay | Admitting: Internal Medicine

## 2024-03-07 DIAGNOSIS — L2084 Intrinsic (allergic) eczema: Secondary | ICD-10-CM

## 2024-03-07 NOTE — Telephone Encounter (Signed)
 Copied from CRM [id number]. Topic: General - Other >> Mar 07, 2024  9:09 AM Dorthula Gavel H wrote: Reason for CRM: Luis from [facility] is requesting face to face notes and prescriptions for medical supplies from provider for this pt. Fax is [phone]

## 2024-03-09 ENCOUNTER — Telehealth: Payer: Self-pay | Admitting: *Deleted

## 2024-03-09 ENCOUNTER — Encounter: Payer: Self-pay | Admitting: Physician Assistant

## 2024-03-09 ENCOUNTER — Other Ambulatory Visit: Payer: Self-pay | Admitting: Internal Medicine

## 2024-03-09 DIAGNOSIS — T502X5A Adverse effect of carbonic-anhydrase inhibitors, benzothiadiazides and other diuretics, initial encounter: Secondary | ICD-10-CM

## 2024-03-09 DIAGNOSIS — I10 Essential (primary) hypertension: Secondary | ICD-10-CM

## 2024-03-09 NOTE — Progress Notes (Signed)
 Care Guide Pharmacy Note  03/09/2024 Name: [name] MRN: [id number] DOB: [birth year]  Referred By: Arcadio Knuckles, MD Reason for referral: Complex Care Management (Outreach to schedule referral with pharmacist )   Latasha Wood is a 68 y.o. year old female who is a primary care patient of Arcadio Knuckles, MD.  [name] was referred to the pharmacist for assistance related to: HTN  Successful contact was made with the patient to discuss pharmacy services including being ready for the pharmacist to call at least 5 minutes before the scheduled appointment time and to have medication bottles and any blood pressure readings ready for review. The patient agreed to meet with the pharmacist via telephone visit on 03/24/2024  Kandis Ormond, CMA [place]  [facility], [facility] Guide Direct Dial: [phone]  Fax: [phone] Website: [email or web address]

## 2024-03-09 NOTE — Telephone Encounter (Signed)
 Copied from CRM [id number]. Topic: Clinical - Medication Refill >> Mar 09, 2024  3:05 PM Freya Jesus wrote: Most Recent Primary Care Visit:  Provider: Arcadio Knuckles  Department: [facility] [place]  Visit Type: OFFICE VISIT  Date: 03/31/2023  Medication: potassium chloride  (KLOR-CON  10) 10 MEQ tablet [[id number]] losartan (COZAAR) 25 MG tablet [[id number]] indapamide  (LOZOL ) 1.25 MG tablet [[id number]]  Has the patient contacted their pharmacy? Yes (Agent: If no, request that the patient contact the pharmacy for the refill. If patient does not wish to contact the pharmacy document the reason why and proceed with request.) (Agent: If yes, when and what did the pharmacy advise?)  Is this the correct pharmacy for this prescription? Yes If no, delete pharmacy and type the correct one.  This is the patient's preferred pharmacy:   [facility] [place], [place] - [address] [place] [address] Phone: [phone] Fax: [phone]   Has the prescription been filled recently? No  Is the patient out of the medication? Yes  Has the patient been seen for an appointment in the last year OR does the patient have an upcoming appointment? Yes  Can we respond through MyChart? Yes  Agent: Please be advised that Rx refills may take up to 3 business days. We ask that you follow-up with your pharmacy.

## 2024-03-10 ENCOUNTER — Other Ambulatory Visit: Payer: Self-pay | Admitting: Internal Medicine

## 2024-03-10 MED ORDER — AMLODIPINE BESYLATE 5 MG PO TABS: 5.0000 mg | ORAL_TABLET | Freq: Every day | ORAL | 0 refills | Status: DC

## 2024-03-10 MED ORDER — POTASSIUM CHLORIDE ER 10 MEQ PO TBCR: 10.0000 meq | EXTENDED_RELEASE_TABLET | Freq: Three times a day (TID) | ORAL | 0 refills | Status: DC

## 2024-03-10 MED ORDER — HYDROCORTISONE 2.5 % EX CREA: TOPICAL_CREAM | Freq: Two times a day (BID) | CUTANEOUS | 3 refills | Status: DC

## 2024-03-10 NOTE — Telephone Encounter (Signed)
 Can you write the script for her medications?

## 2024-03-10 NOTE — Addendum Note (Signed)
 Addended by: Arcadio Knuckles on: 03/10/2024 10:04 AM   Modules accepted: Orders

## 2024-03-11 NOTE — Addendum Note (Signed)
 Addended by: Arcadio Knuckles on: 03/11/2024 10:49 AM   Modules accepted: Orders

## 2024-03-11 NOTE — Telephone Encounter (Signed)
The order is on your desk.

## 2024-03-13 ENCOUNTER — Ambulatory Visit (HOSPITAL_COMMUNITY)
Admission: RE | Admit: 2024-03-13 | Discharge: 2024-03-13 | Disposition: A | Discharge status: Home / Self Care | Source: Ambulatory Visit | Attending: *Deleted | Admitting: *Deleted

## 2024-03-13 DIAGNOSIS — Z432 Encounter for attention to ileostomy: Secondary | ICD-10-CM | POA: Diagnosis not present

## 2024-03-13 DIAGNOSIS — K5190 Ulcerative colitis, unspecified, without complications: Secondary | ICD-10-CM | POA: Insufficient documentation

## 2024-03-13 DIAGNOSIS — L24B3 Irritant contact dermatitis related to fecal or urinary stoma or fistula: Secondary | ICD-10-CM | POA: Insufficient documentation

## 2024-03-13 MED ORDER — INDAPAMIDE 1.25 MG PO TABS: 1.2500 mg | ORAL_TABLET | Freq: Every day | ORAL | 0 refills | Status: DC

## 2024-03-13 MED ORDER — LOSARTAN POTASSIUM 25 MG PO TABS: 25.0000 mg | ORAL_TABLET | Freq: Every day | ORAL | 0 refills | Status: DC

## 2024-03-13 NOTE — Progress Notes (Signed)
 [facility]   Reason for visit:  RLQ ileostomy, getting established with ostomy clinic for ongoing care and supplies. Uses [facility] managed care HPI:  Ulcerative colitis and RLQ ileostomy.   Past Medical History:  Diagnosis Date   Anemia    Anxiety    Blood transfusion    Colitis, ulcerative (HCC)    Colon polyps    Hyperlipidemia    Hypertension    Ileostomy present (HCC)    Numbness and tingling    hands and feet bilat    Shortness of breath dyspnea    talking or walking    Type 2 diabetes mellitus with complication, without long-term current use of insulin  (HCC) 09/05/2015   currently on no medications, (05/26/2016) pt denies diabetes.  States that she had one time high blood sugars d/t prednisone    Family History  Problem Relation Age of Onset   Diabetes Mother    Breast cancer Mother 39   Diabetes Father    Heart disease Father    Diabetes Sister    Diabetes Brother    Breast cancer Sister 33   Colon cancer Paternal Aunt    Breast cancer Maternal Aunt    Allergies  Allergen Reactions   Betadine  [Povidone Iodine ]    Latex Rash    Redness of skin   Tape Rash    Redness of skin OK with hypofix tape and paper tape! NO transpore tape ,adhesive tape!   Current Outpatient Medications  Medication Sig Dispense Refill Last Dose/Taking   ALPRAZolam  (XANAX ) 0.25 MG tablet Take 1 tablet (0.25 mg total) by mouth 2 (two) times daily as needed for anxiety. 180 tablet 0    amLODipine  (NORVASC ) 5 MG tablet Take 1 tablet (5 mg total) by mouth daily. 90 tablet 0    atovaquone-proguanil (MALARONE) 250-100 MG TABS tablet Take 1 tablet by mouth daily.      cholecalciferol (VITAMIN D3) 25 MCG (1000 UNIT) tablet Take 1,000 Units by mouth daily.      Cyanocobalamin  (VITAMIN B-12 PO) Take 500 mcg by mouth daily.      empagliflozin  (JARDIANCE ) 10 MG TABS tablet Take 1 tablet (10 mg total) by mouth daily before breakfast. 90 tablet 1    hydrocortisone  2.5 % cream  Apply topically 2 (two) times daily. 30 g 3    indapamide  (LOZOL ) 1.25 MG tablet Take 1 tablet (1.25 mg total) by mouth daily. 100 tablet 0    loperamide  (IMODIUM ) 2 MG capsule Take 2 mg by mouth as needed for diarrhea or loose stools.      losartan  (COZAAR ) 25 MG tablet Take 1 tablet (25 mg total) by mouth at bedtime. 30 tablet 0    meloxicam  (MOBIC ) 15 MG tablet Take 1 tablet (15 mg total) by mouth daily as needed for pain. 60 tablet 0    Multiple Vitamin (MULTIVITAMIN WITH MINERALS) TABS tablet Take 1 tablet by mouth daily.      potassium chloride  (KLOR-CON  10) 10 MEQ tablet Take 1 tablet (10 mEq total) by mouth 3 (three) times daily. 270 tablet 0    No current facility-administered medications for this encounter.   ROS  Review of Systems  Gastrointestinal:  Positive for abdominal pain.       Ulcerative colitis  Skin:  Positive for rash (peristomal irritation).  All other systems reviewed and are negative.  Vital signs:  BP 125/84 (BP Location: Right Arm)   Pulse 90   Temp 98.9 F (37.2 C) (Oral)  Resp 18   SpO2 97%  Exam:  Physical Exam Vitals reviewed.  HENT:     Mouth/Throat:     Mouth: Mucous membranes are moist.  Cardiovascular:     Rate and Rhythm: Regular rhythm.  Abdominal:     Palpations: Abdomen is soft.  Musculoskeletal:        General: Normal range of motion.  Skin:    General: Skin is warm and dry.     Findings: Erythema and rash present.  Neurological:     General: No focal deficit present.     Mental Status: She is alert.  Psychiatric:        Mood and Affect: Mood normal.        Behavior: Behavior normal.     Stoma type/location:  RLQ ileostomy Stomal assessment/size:  20 mm  budded pink and moist  some bleeding noted. Peristomal assessment:  bleeding from stoma  barrier opening is presized and too small.  Treatment options for stomal/peristomal skin: skin prep, barrier ring and 2 piece coloplast convex pouch with belt.  Uses FLonase steroid spray  when skin is red and irritated Output: liquid brown stool Ostomy pouching: 2pc. Convex coloplast   will size up from presized 18 mm to cut to fit that accommodates up to 20 mm and still fits the same pouches.    Education provided:  We perform pouch change.  I discuss the importance of warming pouch after application for secure fit     Impression/dx  Ileostomy Discussion  Warm pouch after application.  WIll size up to larger barrier but will work with same pouch.  Plan  Send office notes and new prescription to [facility]      Visit time: 65 minutes.   Branda Cain FNP-BC

## 2024-03-13 NOTE — Telephone Encounter (Signed)
 Thank you so much! I will discard of the order on my desk and let you take care of this. Happy Monday!

## 2024-03-14 DIAGNOSIS — H40059 Ocular hypertension, unspecified eye: Secondary | ICD-10-CM | POA: Diagnosis not present

## 2024-03-14 DIAGNOSIS — H40003 Preglaucoma, unspecified, bilateral: Secondary | ICD-10-CM | POA: Diagnosis not present

## 2024-03-15 ENCOUNTER — Encounter: Payer: Medicare Other | Admitting: Internal Medicine

## 2024-03-17 ENCOUNTER — Other Ambulatory Visit (HOSPITAL_COMMUNITY): Payer: Self-pay | Admitting: *Deleted

## 2024-03-17 ENCOUNTER — Encounter (HOSPITAL_COMMUNITY): Payer: Self-pay

## 2024-03-17 MED ORDER — METOPROLOL TARTRATE 100 MG PO TABS: ORAL_TABLET | ORAL | 0 refills | Status: DC

## 2024-03-21 ENCOUNTER — Ambulatory Visit (HOSPITAL_COMMUNITY)
Admission: RE | Admit: 2024-03-21 | Discharge: 2024-03-21 | Disposition: A | Discharge status: Home / Self Care | Source: Ambulatory Visit | Attending: Internal Medicine | Admitting: Internal Medicine

## 2024-03-21 DIAGNOSIS — R0609 Other forms of dyspnea: Secondary | ICD-10-CM | POA: Insufficient documentation

## 2024-03-21 DIAGNOSIS — R072 Precordial pain: Secondary | ICD-10-CM | POA: Diagnosis not present

## 2024-03-21 DIAGNOSIS — R079 Chest pain, unspecified: Secondary | ICD-10-CM | POA: Diagnosis not present

## 2024-03-21 MED ORDER — IOHEXOL 350 MG/ML SOLN: 100.0000 mL | Freq: Once | INTRAVENOUS | Status: DC | PRN

## 2024-03-21 MED ORDER — NITROGLYCERIN 0.4 MG SL SUBL
0.8000 mg | SUBLINGUAL_TABLET | Freq: Once | SUBLINGUAL | Status: AC
  Administered 2024-03-21: 0.8 mg via SUBLINGUAL

## 2024-03-21 MED ORDER — IOHEXOL 350 MG/ML SOLN
100.0000 mL | Freq: Once | INTRAVENOUS | Status: AC | PRN
  Administered 2024-03-21: 100 mL via INTRAVENOUS

## 2024-03-22 ENCOUNTER — Ambulatory Visit: Payer: Self-pay | Admitting: Internal Medicine

## 2024-03-22 DIAGNOSIS — Z432 Encounter for attention to ileostomy: Secondary | ICD-10-CM | POA: Insufficient documentation

## 2024-03-22 DIAGNOSIS — L24B3 Irritant contact dermatitis related to fecal or urinary stoma or fistula: Secondary | ICD-10-CM | POA: Insufficient documentation

## 2024-03-22 NOTE — Discharge Instructions (Signed)
 Orders sent to [facility] for ongoing supplies.

## 2024-03-24 ENCOUNTER — Other Ambulatory Visit: Admitting: Pharmacist

## 2024-03-24 DIAGNOSIS — I10 Essential (primary) hypertension: Secondary | ICD-10-CM

## 2024-03-24 DIAGNOSIS — N1831 Chronic kidney disease, stage 3a: Secondary | ICD-10-CM

## 2024-03-24 NOTE — Progress Notes (Signed)
 03/24/2024 Name: Latasha Wood MRN: [id number] DOB: 1956/03/03  Chief Complaint  Patient presents with   Hypertension   Medication Management   Chronic Kidney Disease    Latasha Wood is a 68 y.o. year old female who presented for a telephone visit.   They were referred to the pharmacist by their PCP for assistance in managing hypertension and CKD.   Subjective:  Care Team: Primary Care Provider: Arcadio Knuckles, MD ; Next Scheduled Visit: none scheduled    Medication Access/Adherence  Current Pharmacy:  [id number]le, [place] - [address] AT [facility] [address] [place] [address] Phone: [phone] Fax: [phone]  [facility] - [place], [place] - [address] [place] [address] Phone: [phone] Fax: [phone]   Patient reports affordability concerns with their medications: Yes  Patient reports access/transportation concerns to their pharmacy: No  Patient reports adherence concerns with their medications:  Yes    Pt notes she travels a lot and will be gone for long periods. Has had difficulty getting refills on her regular medications due to needing a follow up while she is out of town.  *Confirmed she did stop rosuvastatin  (held by PCP due to elevated CK) *Of note, she did have the cardiac CT done. Asked about the results. She requests if an echo can be ordered because she did report SOB and some chest pain to PCP at last visit. He had previously ordered an echo last year which she was unable to schedule due to travel  Hypertension:  Current medications: losartan  25 mg daily, indapamide  1.25 mg daily *Did not start amlodipine  - thought it was sent in error/did not think she needed it since she had not been taking BP medication daily due to traveling/running low on supplies  Patient has a validated, automated, upper arm home BP  cuff Current blood pressure readings: none recent  Patient denies hypotensive s/sx including dizziness, lightheadedness.  Patient reports hypertensive symptoms including chest pain, shortness of breath   CKD: Current medications: losartan  25 mg daily *Did not start Jardiance  due to high cost and not knowing why it was prescribed   Objective:  Lab Results  Component Value Date   HGBA1C 5.9 03/06/2024    Lab Results  Component Value Date   CREATININE 1.12 03/06/2024   BUN 16 03/06/2024   NA 137 03/06/2024   K 3.9 03/06/2024   CL 99 03/06/2024   CO2 31 03/06/2024    Lab Results  Component Value Date   CHOL 171 03/06/2024   HDL 110.00 03/06/2024   LDLCALC 48 03/06/2024   LDLDIRECT 114.0 04/29/2017   TRIG 68.0 03/06/2024   CHOLHDL 2 03/06/2024    Medications Reviewed Today     Reviewed by Dion Frankel, RPH (Pharmacist) on 03/24/24 at 1557  Med List Status: <None>   Medication Order Taking? Sig Documenting Provider Last Dose Status Informant  ALPRAZolam  (XANAX ) 0.25 MG tablet [id number]  Take 1 tablet (0.25 mg total) by mouth 2 (two) times daily as needed for anxiety. Arcadio Knuckles, MD  Active   amLODipine  (NORVASC ) 5 MG tablet [id number] No Take 1 tablet (5 mg total) by mouth daily.  Patient not taking: Reported on 03/24/2024   Arcadio Knuckles, MD Not Taking Active   atovaquone-proguanil (MALARONE) 250-100 MG TABS tablet [id number]  Take 1 tablet by mouth daily. [provider]  Active  cholecalciferol (VITAMIN D3) 25 MCG (1000 UNIT) tablet [id number]  Take 1,000 Units by mouth daily. [provider]  Active Self  Cyanocobalamin  (VITAMIN B-12 PO) [id number]  Take 500 mcg by mouth daily. [provider]  Active   empagliflozin  (JARDIANCE ) 10 MG TABS tablet [id number] No Take 1 tablet (10 mg total) by mouth daily before breakfast.  Patient not taking: Reported on 03/24/2024   Arcadio Knuckles, MD Not Taking Active   hydrocortisone  2.5 % cream  [id number]  Apply topically 2 (two) times daily. Arcadio Knuckles, MD  Active   indapamide  (LOZOL ) 1.25 MG tablet [id number] Yes Take 1 tablet (1.25 mg total) by mouth daily. Arcadio Knuckles, MD Taking Active   loperamide  (IMODIUM ) 2 MG capsule [id number]  Take 2 mg by mouth as needed for diarrhea or loose stools. [provider]  Active   losartan  (COZAAR ) 25 MG tablet [id number] Yes Take 1 tablet (25 mg total) by mouth at bedtime. Arcadio Knuckles, MD Taking Active   meloxicam  (MOBIC ) 15 MG tablet [id number]  Take 1 tablet (15 mg total) by mouth daily as needed for pain. Ulysees Gander, DO  Active   Multiple Vitamin (MULTIVITAMIN WITH MINERALS) TABS tablet [id number]  Take 1 tablet by mouth daily. [provider]  Active Self  potassium chloride  (KLOR-CON  10) 10 MEQ tablet [id number]  Take 1 tablet (10 mEq total) by mouth 3 (three) times daily. Arcadio Knuckles, MD  Active               Assessment/Plan:   Hypertension: - Currently controlled, BP goal <130/80 - Reviewed appropriate blood pressure monitoring technique and reviewed goal blood pressure. Recommended to check home blood pressure and heart rate daily, at least 2 hours after taking BP medication - Recommend to continue current regimen. Will f/u in 2 weeks for BP readings and determine if med adjustment is needed.   CKD: MACR >30 currently, GFR 50 Recommend continuing on losartan  alone and increasing to max dose tolerated Waiting on updated BP readings before making any adjustments  SOB/DOE: CT Scan showed CAC score of 0, no stenosis or plaque on imaging Will ask ask PCP regarding ordering echo  Follow Up Plan: 5/30  [name], PharmD, BCPS, CPP Clinical Pharmacist Practitioner [facility] at [facility] [phone]

## 2024-03-24 NOTE — Patient Instructions (Addendum)
  It was a pleasure speaking with you today!  Check your blood pressure daily for 2 weeks. I will call on 5/30 to review your readings with you.  Feel free to call with any questions or concerns!  [name], PharmD, BCPS, CPP Clinical Pharmacist Practitioner [place] [facility] at [facility] [phone]

## 2024-03-28 DIAGNOSIS — H40003 Preglaucoma, unspecified, bilateral: Secondary | ICD-10-CM | POA: Diagnosis not present

## 2024-03-28 DIAGNOSIS — H40059 Ocular hypertension, unspecified eye: Secondary | ICD-10-CM | POA: Diagnosis not present

## 2024-04-04 ENCOUNTER — Other Ambulatory Visit (HOSPITAL_COMMUNITY): Payer: Self-pay | Admitting: Nurse Practitioner

## 2024-04-04 ENCOUNTER — Other Ambulatory Visit: Payer: Self-pay | Admitting: Internal Medicine

## 2024-04-04 ENCOUNTER — Encounter: Payer: Self-pay | Admitting: Pharmacist

## 2024-04-04 DIAGNOSIS — R0609 Other forms of dyspnea: Secondary | ICD-10-CM | POA: Insufficient documentation

## 2024-04-04 DIAGNOSIS — Z433 Encounter for attention to colostomy: Secondary | ICD-10-CM

## 2024-04-04 DIAGNOSIS — L24B3 Irritant contact dermatitis related to fecal or urinary stoma or fistula: Secondary | ICD-10-CM

## 2024-04-07 ENCOUNTER — Other Ambulatory Visit (INDEPENDENT_AMBULATORY_CARE_PROVIDER_SITE_OTHER): Admitting: Pharmacist

## 2024-04-07 VITALS — BP 157/91

## 2024-04-07 DIAGNOSIS — I10 Essential (primary) hypertension: Secondary | ICD-10-CM

## 2024-04-07 DIAGNOSIS — N1831 Chronic kidney disease, stage 3a: Secondary | ICD-10-CM

## 2024-04-07 MED ORDER — LOSARTAN POTASSIUM 25 MG PO TABS
50.0000 mg | ORAL_TABLET | Freq: Every day | ORAL | Status: DC
Start: 2024-04-07 — End: 2024-05-11

## 2024-04-07 NOTE — Progress Notes (Signed)
 04/07/2024 Name: Latasha Wood MRN: [id number] DOB: 08/19/1956  Chief Complaint  Patient presents with   Hypertension   Medication Management   Chronic Kidney Disease    Latasha Wood is a 68 y.o. year old female who presented for a telephone visit.   They were referred to the pharmacist by their PCP for assistance in managing hypertension and CKD.   Subjective:  Care Team: Primary Care Provider: Arcadio Knuckles, MD ; Next Scheduled Visit: none scheduled   Medication Access/Adherence  Current Pharmacy:  [id number], [place] - [address] AT [facility] [address] [place] [address] Phone: [phone] Fax: [phone]  [facility] Delivery - [place], [place] - [address] [place]  [address] Phone: [phone] Fax: [phone]   Patient reports affordability concerns with their medications: Yes  Patient reports access/transportation concerns to their pharmacy: No  Patient reports adherence concerns with their medications:  Yes    Pt notes she travels a lot and will be gone for long periods. Has had difficulty getting refills on her regular medications due to needing a follow up while she is out of town.  Hypertension:  Current medications: losartan  25 mg daily, indapamide  1.25 mg daily *Did not start amlodipine  - thought it was sent in error/did not think she needed it since she had not been taking BP medication daily due to traveling/running low on supplies  Patient has a validated, automated, upper arm home BP cuff Current blood pressure readings: ~140/80-82 BP during call: 157/91  Patient denies hypotensive s/sx including dizziness, lightheadedness.  Patient reports hypertensive symptoms including chest pain, shortness of breath  *PCP sent cardio referral 5/27- pt informed  CKD: Current medications: losartan  25 mg daily *Did not start  Jardiance  due to high cost and not knowing why it was prescribed   Objective:  Lab Results  Component Value Date   HGBA1C 5.9 03/06/2024    Lab Results  Component Value Date   CREATININE 1.12 03/06/2024   BUN 16 03/06/2024   NA 137 03/06/2024   K 3.9 03/06/2024   CL 99 03/06/2024   CO2 31 03/06/2024    Lab Results  Component Value Date   CHOL 171 03/06/2024   HDL 110.00 03/06/2024   LDLCALC 48 03/06/2024   LDLDIRECT 114.0 04/29/2017   TRIG 68.0 03/06/2024   CHOLHDL 2 03/06/2024    Medications Reviewed Today     Reviewed by Dion Frankel, RPH (Pharmacist) on 04/07/24 at [phone]  Med List Status: <None>   Medication Order Taking? Sig Documenting Provider Last Dose Status Informant  ALPRAZolam  (XANAX ) 0.25 MG tablet [id number]  Take 1 tablet (0.25 mg total) by mouth 2 (two) times daily as needed for anxiety. Arcadio Knuckles, MD  Active   amLODipine  (NORVASC ) 5 MG tablet [id number] No Take 1 tablet (5 mg total) by mouth daily.  Patient not taking: Reported on 04/07/2024   Arcadio Knuckles, MD Not Taking Active   atovaquone-proguanil (MALARONE) 250-100 MG TABS tablet [id number]  Take 1 tablet by mouth daily. [provider]  Active   cholecalciferol (VITAMIN D3) 25 MCG (1000 UNIT) tablet [id number]  Take 1,000 Units by mouth daily. [provider]  Active Self  Cyanocobalamin  (VITAMIN B-12 PO) [id number]  Take 500 mcg by mouth daily. [provider]  Active   empagliflozin  (JARDIANCE ) 10 MG TABS tablet [id number] No Take 1 tablet (10  mg total) by mouth daily before breakfast.  Patient not taking: Reported on 04/07/2024   Arcadio Knuckles, MD Not Taking Active   hydrocortisone  2.5 % cream [id number]  Apply topically 2 (two) times daily. Arcadio Knuckles, MD  Active   indapamide  (LOZOL ) 1.25 MG tablet [id number] Yes Take 1 tablet (1.25 mg total) by mouth daily. Arcadio Knuckles, MD Taking Active   loperamide  (IMODIUM ) 2 MG capsule [id number]  Take 2 mg by mouth  as needed for diarrhea or loose stools. [provider]  Active   losartan  (COZAAR ) 25 MG tablet [id number] Yes Take 1 tablet (25 mg total) by mouth at bedtime. Arcadio Knuckles, MD Taking Active   meloxicam  (MOBIC ) 15 MG tablet [id number]  Take 1 tablet (15 mg total) by mouth daily as needed for pain. Ulysees Gander, DO  Active   Multiple Vitamin (MULTIVITAMIN WITH MINERALS) TABS tablet [id number]  Take 1 tablet by mouth daily. [provider]  Active Self  potassium chloride  (KLOR-CON  10) 10 MEQ tablet [id number]  Take 1 tablet (10 mEq total) by mouth 3 (three) times daily. Arcadio Knuckles, MD  Active               Assessment/Plan:   Hypertension: - Currently uncontrolled, BP goal <130/80 - Reviewed appropriate blood pressure monitoring technique and reviewed goal blood pressure. Recommended to check home blood pressure and heart rate daily, at least 2 hours after taking BP medication - Recommend to continue indapamide , increase losartan  to 25 mg 2 tablets daily  CKD: MACR >30 currently, GFR 50 Recommend continuing on losartan  alone and increasing to max dose tolerated   Follow Up Plan: 6/10  [name], PharmD, BCPS, CPP Clinical Pharmacist Practitioner [place] [facility] at [facility] [phone]

## 2024-04-07 NOTE — Patient Instructions (Signed)
 It was a pleasure speaking with you today!  Increase your losartan  to 25 mg 2 tablets daily and continue monitoring your blood pressure. I will call back on 6/10.  Feel free to call with any questions or concerns!  [name], PharmD, BCPS, CPP Clinical Pharmacist Practitioner [place] [facility] at [facility] [phone]

## 2024-04-10 NOTE — Progress Notes (Deleted)
    Ben Jackson D.[facility] [address] [place] [address] Phone: [phone]   Assessment and Plan:     There are no diagnoses linked to this encounter.  ***   Pertinent previous records reviewed include ***    Follow Up: ***     Subjective:   I, Kaien Pezzullo, am serving as a Neurosurgeon for Doctor Ulysees Gander   Chief Complaint: bilateral knee pain    HPI:    08/22/21 Patient is a 68 year old female presenting with bilateral knee pain worse when walking. Patient has had pain in both knees for several years but in the last year it has been worse. Patient locates pain to front on knee cap and the back of her knee feels like a constant aching worse when laying. Patient is a walker and once she gets going the pain will eventually subside. Stopped the meloxicam  since she found out her GFR has dropped.    Radiates: yes down to the shins  Swelling: sometimes Mechanical symptoms: yes  Numbness/tingling: tingling pain that starts lateral R leg above the knee down the leg  Weakness: rarely, but in the right leg  Aggravates: sitting to standing, going up and down steps  Treatments tried: brace, meloxicam , tylenol ,    04/21/2022 Patient states that her knee are hurting real bad wants a CSI    11/11/2022 Patient states is ready for an injection today    03/31/2023 Patient states would like bilateral injection    04/11/2024 Patient states   Relevant Historical Information: Hypertension, ulcerative colitis with history of resection  Additional pertinent review of systems negative.   Current Outpatient Medications:    ALPRAZolam  (XANAX ) 0.25 MG tablet, Take 1 tablet (0.25 mg total) by mouth 2 (two) times daily as needed for anxiety., Disp: 180 tablet, Rfl: 0   atovaquone-proguanil (MALARONE) 250-100 MG TABS tablet, Take 1 tablet by mouth daily., Disp: , Rfl:    cholecalciferol (VITAMIN D3) 25 MCG (1000 UNIT) tablet, Take 1,000 Units by  mouth daily., Disp: , Rfl:    Cyanocobalamin  (VITAMIN B-12 PO), Take 500 mcg by mouth daily., Disp: , Rfl:    hydrocortisone  2.5 % cream, Apply topically 2 (two) times daily., Disp: 30 g, Rfl: 3   indapamide  (LOZOL ) 1.25 MG tablet, Take 1 tablet (1.25 mg total) by mouth daily., Disp: 100 tablet, Rfl: 0   loperamide  (IMODIUM ) 2 MG capsule, Take 2 mg by mouth as needed for diarrhea or loose stools., Disp: , Rfl:    losartan  (COZAAR ) 25 MG tablet, Take 2 tablets (50 mg total) by mouth at bedtime., Disp: , Rfl:    meloxicam  (MOBIC ) 15 MG tablet, Take 1 tablet (15 mg total) by mouth daily as needed for pain., Disp: 60 tablet, Rfl: 0   Multiple Vitamin (MULTIVITAMIN WITH MINERALS) TABS tablet, Take 1 tablet by mouth daily., Disp: , Rfl:    potassium chloride  (KLOR-CON  10) 10 MEQ tablet, Take 1 tablet (10 mEq total) by mouth 3 (three) times daily., Disp: 270 tablet, Rfl: 0   Objective:     There were no vitals filed for this visit.    There is no height or weight on file to calculate BMI.    Physical Exam:    ***   Electronically signed by:  Marshall Skeeter D.[facility] 12:33 PM 04/10/24

## 2024-04-11 ENCOUNTER — Ambulatory Visit: Admitting: Sports Medicine

## 2024-04-14 ENCOUNTER — Ambulatory Visit

## 2024-04-14 VITALS — Ht 63.5 in | Wt 166.0 lb

## 2024-04-14 DIAGNOSIS — Z0000 Encounter for general adult medical examination without abnormal findings: Secondary | ICD-10-CM

## 2024-04-14 NOTE — Progress Notes (Signed)
 Subjective:   Latasha Wood is a 68 y.o. who presents for a Medicare Wellness preventive visit.  As a reminder, Annual Wellness Visits don't include a physical exam, and some assessments may be limited, especially if this visit is performed virtually. We may recommend an in-person follow-up visit with your provider if needed.  Visit Complete: Virtual I connected with  Latasha Wood on 04/14/24 by a audio enabled telemedicine application and verified that I am speaking with the correct person using two identifiers.  Patient Location: Home  Provider Location: Office/Clinic  I discussed the limitations of evaluation and management by telemedicine. The patient expressed understanding and agreed to proceed.  Vital Signs: Because this visit was a virtual/telehealth visit, some criteria may be missing or patient reported. Any vitals not documented were not able to be obtained and vitals that have been documented are patient reported.  VideoDeclined- This patient declined Librarian, academic. Therefore the visit was completed with audio only.  Persons Participating in Visit: Patient.  AWV Questionnaire: Yes: Patient Medicare AWV questionnaire was completed by the patient on 04/14/2024; I have confirmed that all information answered by patient is correct and no changes since this date.        Objective:     Today's Vitals   04/14/24 1313  Weight: 166 lb (75.3 kg)  Height: 5' 3.5" (1.613 m)   Body mass index is 28.94 kg/m.     04/14/2024    1:12 PM 01/14/2020    9:33 PM 08/01/2018    2:14 PM 10/20/2017    8:10 AM 08/20/2016    1:45 PM 08/20/2016    8:00 AM 08/12/2016   11:51 AM  Advanced Directives  Does Patient Have a Medical Advance Directive? Yes No No No No No No  Type of Estate agent of [place];Living will        Copy of Healthcare Power of Attorney in Chart? No - copy requested        Would patient like information  on creating a medical advance directive?  No - Patient declined  No - Patient declined No - patient declined information No - patient declined information No - patient declined information    Current Medications (verified) Outpatient Encounter Medications as of 04/14/2024  Medication Sig   ALPRAZolam  (XANAX ) 0.25 MG tablet Take 1 tablet (0.25 mg total) by mouth 2 (two) times daily as needed for anxiety.   atovaquone-proguanil (MALARONE) 250-100 MG TABS tablet Take 1 tablet by mouth daily.   cholecalciferol (VITAMIN D3) 25 MCG (1000 UNIT) tablet Take 1,000 Units by mouth daily.   Cyanocobalamin  (VITAMIN B-12 PO) Take 500 mcg by mouth daily.   hydrocortisone  2.5 % cream Apply topically 2 (two) times daily.   indapamide  (LOZOL ) 1.25 MG tablet Take 1 tablet (1.25 mg total) by mouth daily.   loperamide  (IMODIUM ) 2 MG capsule Take 2 mg by mouth as needed for diarrhea or loose stools.   losartan  (COZAAR ) 25 MG tablet Take 2 tablets (50 mg total) by mouth at bedtime.   meloxicam  (MOBIC ) 15 MG tablet Take 1 tablet (15 mg total) by mouth daily as needed for pain.   Multiple Vitamin (MULTIVITAMIN WITH MINERALS) TABS tablet Take 1 tablet by mouth daily.   potassium chloride  (KLOR-CON  10) 10 MEQ tablet Take 1 tablet (10 mEq total) by mouth 3 (three) times daily.   No facility-administered encounter medications on file as of 04/14/2024.    Allergies (verified) Betadine  [povidone iodine ],  Latex, and Tape   History: Past Medical History:  Diagnosis Date   Anemia    Anxiety    Blood transfusion    Colitis, ulcerative (HCC)    Colon polyps    Hyperlipidemia    Hypertension    Ileostomy present (HCC)    Numbness and tingling    hands and feet bilat    Shortness of breath dyspnea    talking or walking    Type 2 diabetes mellitus with complication, without long-term current use of insulin  (HCC) 09/05/2015   currently on no medications, (05/26/2016) pt denies diabetes.  States that she had one time high  blood sugars d/t prednisone    Past Surgical History:  Procedure Laterality Date   ABDOMINAL HYSTERECTOMY  2000   ABDOMINAL SURGERY     BREAST CYST ASPIRATION     CATARACT EXTRACTION W/PHACO Right 10/20/2017   Procedure: CATARACT EXTRACTION PHACO AND INTRAOCULAR LENS PLACEMENT (IOC);  Surgeon: Rosa College, MD;  Location: [facility] ORS;  Service: Ophthalmology;  Laterality: Right;  US  00:50.1 AP% 13.2 CDE 6.61 Fluid Pack lot # [id number]   COLON SURGERY     colostomy   cortisol shot     in left shoulder   DILATION AND CURETTAGE OF UTERUS     most likely after miscarriage   fibrocystic breast disease     q 6 month mammogram   gravida 6 para 2     all SAB   hyadiform mole     ILEO LOOP DIVERSION N/A 09/25/2015   Procedure: ILEO LOOP COLOSTOMY;  Surgeon: Joyce Nixon, MD;  Location: [facility];  Service: General;  Laterality: N/A;   ILEOSTOMY CLOSURE N/A 10/08/2015   Procedure: ILEOSTOMY REVISION;  Surgeon: Joyce Nixon, MD;  Location: [facility];  Service: General;  Laterality: N/A;   ILEOSTOMY CLOSURE N/A 08/20/2016   Procedure: OPEN RELOCATION OF ILEOSTOMY;  Surgeon: Joyce Nixon, MD;  Location: [facility];  Service: General;  Laterality: N/A;   IR GENERIC HISTORICAL  09/01/2016   IR US  GUIDE VASC ACCESS RIGHT 09/01/2016 Prudence Brown, PA-C [facility]   IR GENERIC HISTORICAL  09/01/2016   IR FLUORO GUIDE CV LINE RIGHT 09/01/2016 Prudence Brown, PA-C [facility]   KNEE ARTHROSCOPY W/ MENISCAL REPAIR     right knee ([place])   LAPAROSCOPIC SMALL BOWEL RESECTION N/A 09/25/2015   Procedure: LAPAROSCOPIC BOWEL RESECTION TIMES TWO;  Surgeon: Joyce Nixon, MD;  Location: [facility];  Service: General;  Laterality: N/A;   ROBOTIC ASSISTED LAPAROSCOPIC LYSIS OF ADHESION N/A 09/25/2015   Procedure: XI ROBOTIC ASSISTED LAPAROSCOPIC LYSIS OF ADHESION;  Surgeon: Joyce Nixon, MD;  Location: [facility];  Service: General;  Laterality: N/A;  90 minutes   Small bowel obstruction     Family History   Problem Relation Age of Onset   Diabetes Mother    Breast cancer Mother 37   Diabetes Father    Heart disease Father    Diabetes Sister    Diabetes Brother    Breast cancer Sister 64   Colon cancer Paternal Aunt    Breast cancer Maternal Aunt    Social History   Socioeconomic History   Marital status: Married    Spouse name: Not on file   Number of children: 2   Years of education: 18   Highest education level: Professional school degree (e.g., MD, DDS, DVM, JD)  Occupational History   Occupation: customer relations    Employer: [employer]    Comment: [employer]  Tobacco Use  Smoking status: Never   Smokeless tobacco: Never  Substance and Sexual Activity   Alcohol use: Yes    Alcohol/week: 2.0 standard drinks of alcohol    Types: 2 Standard drinks or equivalent per week    Comment: occ   Drug use: No   Sexual activity: Yes    Partners: Male  Other Topics Concern   Not on file  Social History Narrative   HSG, [employer] - BA, [employer] for med tech.  Married '75. 2 dtrs - '79, '82; 1 grandchild. Marriage is in good health. No abuse issues.    Social Drivers of Corporate investment banker Strain: Low Risk  (04/14/2024)   Overall Financial Resource Strain (CARDIA)    Difficulty of Paying Living Expenses: Not hard at all  Food Insecurity: No Food Insecurity (04/14/2024)   Hunger Vital Sign    Worried About Running Out of Food in the Last Year: Never true    Ran Out of Food in the Last Year: Never true  Transportation Needs: No Transportation Needs (04/14/2024)   PRAPARE - Administrator, Civil Service (Medical): No    Lack of Transportation (Non-Medical): No  Physical Activity: Inactive (04/14/2024)   Exercise Vital Sign    Days of Exercise per Week: 0 days    Minutes of Exercise per Session: 0 min  Stress: No Stress Concern Present (04/14/2024)   [employer] of Occupational Health - Occupational Stress Questionnaire    Feeling of Stress : Not at all   Social Connections: Moderately Isolated (04/14/2024)   Social Connection and Isolation Panel [NHANES]    Frequency of Communication with Friends and Family: More than three times a week    Frequency of Social Gatherings with Friends and Family: More than three times a week    Attends Religious Services: Never    Database administrator or Organizations: No    Attends Engineer, structural: Never    Marital Status: Married    Tobacco Counseling Counseling given: No    Clinical Intake:  Pre-visit preparation completed: Yes  Pain : No/denies pain     BMI - recorded: 28.94 Nutritional Status: BMI 25 -29 Overweight Nutritional Risks: None Diabetes: No  Lab Results  Component Value Date   HGBA1C 5.9 03/06/2024   HGBA1C 6.0 03/31/2023   HGBA1C 6.3 10/20/2022     How often do you need to have someone help you when you read instructions, pamphlets, or other written materials from your doctor or pharmacy?: 1 - Never  Interpreter Needed?: No  Information entered by :: Kandy Orris, CMA   Activities of Daily Living     04/14/2024    1:00 PM  In your present state of health, do you have any difficulty performing the following activities:  Hearing? 0  Vision? 0  Difficulty concentrating or making decisions? 0  Walking or climbing stairs? 0  Dressing or bathing? 0  Doing errands, shopping? 0  Preparing Food and eating ? N  Using the Toilet? N  In the past six months, have you accidently leaked urine? N  Do you have problems with loss of bowel control? N  Managing your Medications? N  Managing your Finances? N  Housekeeping or managing your Housekeeping? N    Patient Care Team: Arcadio Knuckles, MD as PCP - General (Internal Medicine) Pietro Bridegroom, MD (Inactive) (Gastroenterology) Meriam Stamp, MD (Obstetrics and Gynecology) Gregorio Lease, MD (Ophthalmology) Joyce Nixon, MD as Consulting Physician (Colon  and Rectal Surgery) Dion Frankel, [facility]  (Pharmacist) Trudi Fus, MD as Consulting Physician (Ophthalmology)  I have updated your Care Teams any recent Medical Services you may have received from other providers in the past year.     Assessment:    This is a routine wellness examination for Latasha Wood.  Hearing/Vision screen Hearing Screening - Comments:: Denies hearing difficulties   Vision Screening - Comments:: Wears rx glasses - up to date with routine eye exams with [facility]   Goals Addressed               This Visit's Progress     Patient Stated (pt-stated)        Patient stated she doesn't want to be obese and watch diet.  Exercise more.       Depression Screen     04/14/2024    1:19 PM 03/06/2024    9:14 AM 03/31/2023    2:08 PM 02/04/2023   10:49 AM 12/30/2021   11:07 AM 08/07/2021    1:27 PM 01/03/2020    9:35 AM  PHQ 2/9 Scores  PHQ - 2 Score 0 0 0 0 0 0 2  PHQ- 9 Score 0   0   5    Fall Risk     04/14/2024    1:19 PM 04/14/2024    1:00 PM 03/06/2024    9:13 AM 03/31/2023    2:08 PM 02/04/2023   10:49 AM  Fall Risk   Falls in the past year? 0 0 1 0 0  Number falls in past yr: 0 0 1 0 0  Injury with Fall? 0 0 0 0 0  Risk for fall due to : No Fall Risks  No Fall Risks No Fall Risks No Fall Risks  Follow up Falls evaluation completed;Falls prevention discussed  Falls evaluation completed Falls evaluation completed Falls evaluation completed    MEDICARE RISK AT HOME:  Medicare Risk at Home Any stairs in or around the home?: Yes If so, are there any without handrails?: No Home free of loose throw rugs in walkways, pet beds, electrical cords, etc?: Yes Adequate lighting in your home to reduce risk of falls?: Yes Life alert?: No Use of a cane, walker or w/c?: No Grab bars in the bathroom?: No Shower chair or bench in shower?: No Elevated toilet seat or a handicapped toilet?: Yes  TIMED UP AND GO:  Was the test performed?  No  Cognitive Function: 6CIT completed         04/14/2024    1:33 PM  6CIT Screen  What Year? 0 points  What month? 0 points  What time? 0 points  Count back from 20 0 points  Months in reverse 0 points  Repeat phrase 0 points  Total Score 0 points    Immunizations Immunization History  Administered Date(s) Administered   Fluad Quad(high Dose 65+) 08/20/2022   Hepatitis B 05/01/2013   Hepatitis B, PED/ADOLESCENT 06/01/2013, 10/31/2013   Influenza Whole 08/19/2012   Influenza,inj,Quad PF,6+ Mos 09/07/2017, 09/14/2019   Influenza-Unspecified 08/09/2016, 08/14/2020, 08/01/2021   Moderna Covid-19 Vaccine Bivalent Booster 54yrs & up 08/20/2022   PFIZER(Purple Top)SARS-COV-2 Vaccination 01/09/2020, 02/09/2020, 08/16/2020   PPD Test 07/31/2011, 10/19/2012, 11/07/2012, 12/06/2013   Pfizer Covid-19 Vaccine Bivalent Booster 31yrs & up 02/25/2021, 08/01/2021, 03/25/2022   Pneumococcal Conjugate-13 05/28/2015   Pneumococcal Polysaccharide-23 03/23/2016, 12/30/2021   Tdap 09/05/2012, 08/01/2021   Zoster Recombinant(Shingrix) 10/16/2019, 12/19/2019    Screening Tests Health Maintenance  Topic  Date Due   COVID-19 Vaccine (8 - 2024-25 season) 07/11/2023   INFLUENZA VACCINE  06/09/2024   HEMOGLOBIN A1C  09/05/2024   OPHTHALMOLOGY EXAM  02/22/2025   Diabetic kidney evaluation - eGFR measurement  03/06/2025   Diabetic kidney evaluation - Urine ACR  03/06/2025   FOOT EXAM  03/06/2025   Medicare Annual Wellness (AWV)  04/14/2025   MAMMOGRAM  02/14/2026   DTaP/Tdap/Td (3 - Td or Tdap) 08/02/2031   Pneumonia Vaccine 28+ Years old  Completed   DEXA SCAN  Completed   Hepatitis C Screening  Completed   Zoster Vaccines- Shingrix  Completed   HPV VACCINES  Aged Out   Meningococcal B Vaccine  Aged Out   Colonoscopy  Discontinued    Health Maintenance  Health Maintenance Due  Topic Date Due   COVID-19 Vaccine (8 - 2024-25 season) 07/11/2023   Health Maintenance Items Addressed:04/14/2024   Additional Screening:  Vision Screening:  Recommended annual ophthalmology exams for early detection of glaucoma and other disorders of the eye. Pt stated has seen Dr Donalda Fruit of [facility] in 2025.  Dental Screening: Recommended annual dental exams for proper oral hygiene  Community Resource Referral / Chronic Care Management: CRR required this visit?  No   CCM required this visit?  No   Plan:    I have personally reviewed and noted the following in the patient's chart:   Medical and social history Use of alcohol, tobacco or illicit drugs  Current medications and supplements including opioid prescriptions. Patient is not currently taking opioid prescriptions. Functional ability and status Nutritional status Physical activity Advanced directives List of other physicians Hospitalizations, surgeries, and ER visits in previous 12 months Vitals Screenings to include cognitive, depression, and falls Referrals and appointments  In addition, I have reviewed and discussed with patient certain preventive protocols, quality metrics, and best practice recommendations. A written personalized care plan for preventive services as well as general preventive health recommendations were provided to patient.   Patria Bookbinder, CMA   04/14/2024   After Visit Summary: Due to this being a video visit, the patient will review information via MyChart.   Notes: Nothing significant to report at this time.

## 2024-04-14 NOTE — Patient Instructions (Signed)
 Latasha Wood , Thank you for taking time out of your busy schedule to complete your Annual Wellness Visit with me. I enjoyed our conversation and look forward to speaking with you again next year. I, as well as your care team,  appreciate your ongoing commitment to your health goals. Please review the following plan we discussed and let me know if I can assist you in the future. Your Game plan/ To Do List   Follow up Visits: Next Medicare AWV with our clinical staff: 04/18/2025   Have you seen your provider in the last 6 months (3 months if uncontrolled diabetes)? Yes Next Office Visit with your provider: 03/08/2025  Clinician Recommendations:  Aim for 30 minutes of exercise or brisk walking, 6-8 glasses of water, and 5 servings of fruits and vegetables each day.       This is a list of the screening recommended for you and due dates:  Health Maintenance  Topic Date Due   COVID-19 Vaccine (8 - 2024-25 season) 07/11/2023   Flu Shot  06/09/2024   Hemoglobin A1C  09/05/2024   Eye exam for diabetics  02/22/2025   Yearly kidney function blood test for diabetes  03/06/2025   Yearly kidney health urinalysis for diabetes  03/06/2025   Complete foot exam   03/06/2025   Medicare Annual Wellness Visit  04/14/2025   Mammogram  02/14/2026   DTaP/Tdap/Td vaccine (3 - Td or Tdap) 08/02/2031   Pneumonia Vaccine  Completed   DEXA scan (bone density measurement)  Completed   Hepatitis C Screening  Completed   Zoster (Shingles) Vaccine  Completed   HPV Vaccine  Aged Out   Meningitis B Vaccine  Aged Out   Colon Cancer Screening  Discontinued    Advanced directives: (Declined) Advance directive discussed with you today. Even though you declined this today, please call our office should you change your mind, and we can give you the proper paperwork for you to fill out. Advance Care Planning is important because it:  [x]  Makes sure you receive the medical care that is consistent with your values, goals,  and preferences  [x]  It provides guidance to your family and loved ones and reduces their decisional burden about whether or not they are making the right decisions based on your wishes.  Follow the link provided in your after visit summary or read over the paperwork we have mailed to you to help you started getting your Advance Directives in place. If you need assistance in completing these, please reach out to us  so that we can help you!

## 2024-04-18 ENCOUNTER — Other Ambulatory Visit (INDEPENDENT_AMBULATORY_CARE_PROVIDER_SITE_OTHER): Admitting: Pharmacist

## 2024-04-18 VITALS — BP 139/77

## 2024-04-18 DIAGNOSIS — I10 Essential (primary) hypertension: Secondary | ICD-10-CM

## 2024-04-18 NOTE — Patient Instructions (Signed)
 It was a pleasure speaking with you today!  Continue your current regimen. Come for in office blood pressure check and updated lab work on 6/17 at 10:30 AM.  Feel free to call with any questions or concerns!  [name], PharmD, BCPS, CPP Clinical Pharmacist Practitioner [facility] [phone]

## 2024-04-18 NOTE — Progress Notes (Signed)
 04/18/2024 Name: Latasha Wood MRN: [id number] DOB: [birth year]  Chief Complaint  Patient presents with   Hypertension   Medication Management    Latasha Wood is a 68 y.o. year old female who presented for a telephone visit.   They were referred to the pharmacist by their PCP for assistance in managing hypertension and CKD.   Subjective:  Care Team: Primary Care Provider: Arcadio Knuckles, MD ; Next Scheduled Visit: none scheduled   Medication Access/Adherence  Current Pharmacy:  [id number] [place], [place] - [address] AT [facility] [address] [place] [address] Phone: [phone] Fax: [phone]  [facility] [place] - [address] [place] [address] Phone: [phone] Fax: [phone]   Patient reports affordability concerns with their medications: Yes  Patient reports access/transportation concerns to their pharmacy: No  Patient reports adherence concerns with their medications:  Yes    Pt notes she travels a lot and will be gone for long periods. Has had difficulty getting refills on her regular medications due to needing a follow up while she is out of town.  Hypertension:  Current medications: losartan  25 mg 2 tablets daily, indapamide  1.25 mg daily *Did not start amlodipine  - thought it was sent in error/did not think she needed it since she had not been taking BP medication daily due to traveling/running low on supplies  Patient has a validated, automated, upper arm home BP cuff Current blood pressure readings: 139/77 during call  Prior to losartan  increase: 157/91  Patient denies hypotensive s/sx including dizziness, lightheadedness.  Patient reports hypertensive symptoms including chest pain, headache, shortness of breath She notes she does feel better but not 100%  *PCP sent cardio referral 5/27- pt informed, has not  heard from them to schedule yet  CKD: Current medications: losartan  25 mg daily *Did not start Jardiance  due to high cost and not knowing why it was prescribed   Objective:  Lab Results  Component Value Date   HGBA1C 5.9 03/06/2024    Lab Results  Component Value Date   CREATININE 1.12 03/06/2024   BUN 16 03/06/2024   NA 137 03/06/2024   K 3.9 03/06/2024   CL 99 03/06/2024   CO2 31 03/06/2024    Lab Results  Component Value Date   CHOL 171 03/06/2024   HDL 110.00 03/06/2024   LDLCALC 48 03/06/2024   LDLDIRECT 114.0 04/29/2017   TRIG 68.0 03/06/2024   CHOLHDL 2 03/06/2024    Medications Reviewed Today     Reviewed by Dion Frankel, RPH (Pharmacist) on 04/18/24 at 1011  Med List Status: <None>   Medication Order Taking? Sig Documenting Provider Last Dose Status Informant  ALPRAZolam  (XANAX ) 0.25 MG tablet [id number]  Take 1 tablet (0.25 mg total) by mouth 2 (two) times daily as needed for anxiety. Arcadio Knuckles, MD  Active   atovaquone-proguanil (MALARONE) 250-100 MG TABS tablet [id number]  Take 1 tablet by mouth daily. [provider]  Active   cholecalciferol (VITAMIN D3) 25 MCG (1000 UNIT) tablet [id number]  Take 1,000 Units by mouth daily. [provider]  Active Self  Cyanocobalamin  (VITAMIN B-12 PO) [id number]  Take 500 mcg by mouth daily. [provider]  Active   hydrocortisone  2.5 % cream [id number]  Apply topically 2 (two) times daily. Arcadio Knuckles, MD  Active   indapamide  (LOZOL ) 1.25 MG tablet [id number] Yes  Take 1 tablet (1.25 mg total) by mouth daily. Arcadio Knuckles, MD Taking Active   loperamide  (IMODIUM ) 2 MG capsule [id number]  Take 2 mg by mouth as needed for diarrhea or loose stools. [provider]  Active   losartan  (COZAAR ) 25 MG tablet [id number] Yes Take 2 tablets (50 mg total) by mouth at bedtime. Arcadio Knuckles, MD Taking Active   meloxicam  (MOBIC ) 15 MG tablet [id number]  Take 1 tablet (15 mg total) by  mouth daily as needed for pain. Ulysees Gander, DO  Active   Multiple Vitamin (MULTIVITAMIN WITH MINERALS) TABS tablet [id number]  Take 1 tablet by mouth daily. [provider]  Active Self  potassium chloride  (KLOR-CON  10) 10 MEQ tablet [id number]  Take 1 tablet (10 mEq total) by mouth 3 (three) times daily. Arcadio Knuckles, MD  Active               Assessment/Plan:   Hypertension: - Currently uncontrolled, BP goal <130/80 - Reviewed appropriate blood pressure monitoring technique and reviewed goal blood pressure. Recommended to check home blood pressure and heart rate daily, at least 2 hours after taking BP medication - Recommend to continue indapamide  and  losartan  to 25 mg 2 tablets daily - She will come next week for in office BP check and to get updated BMP  CKD: MACR >30 currently, GFR 50 Recommend continuing on losartan  alone and increasing to max dose tolerated   Follow Up Plan: 6/17  [name], PharmD, BCPS, CPP Clinical Pharmacist Practitioner [facility] [phone]

## 2024-04-19 ENCOUNTER — Encounter: Payer: Self-pay | Admitting: Cardiology

## 2024-04-19 ENCOUNTER — Ambulatory Visit: Attending: Cardiology | Admitting: Cardiology

## 2024-04-19 VITALS — BP 132/76 | HR 78 | Ht 63.5 in | Wt 163.0 lb

## 2024-04-19 DIAGNOSIS — R011 Cardiac murmur, unspecified: Secondary | ICD-10-CM | POA: Diagnosis not present

## 2024-04-19 DIAGNOSIS — R0609 Other forms of dyspnea: Secondary | ICD-10-CM

## 2024-04-19 DIAGNOSIS — I10 Essential (primary) hypertension: Secondary | ICD-10-CM

## 2024-04-19 NOTE — Progress Notes (Signed)
 Cardiology Office Note:    Date:  04/19/2024   ID:  Latasha Wood, DOB Nov 29, 1955, MRN [id number]  PCP:  Arcadio Knuckles, MD  Cardiologist:  Nelia Balzarine, MD   Referring MD: Arcadio Knuckles, MD    ASSESSMENT:    1. Murmur   2. Essential hypertension   3. DOE (dyspnea on exertion)    PLAN:    In order of problems listed above:  Primary prevention stressed with the patient.  Importance of compliance with diet medication stressed and patient verbalized standing. I reassured the patient about findings of the CT scan and from a cardiovascular standpoint they are excellent with calcium  score of 0 and no documented plaque on the report Dyspnea on exertion: Significant and we will do a D-dimer today to help us  assess to see if there is any issue with thromboembolism. Cardiac murmur: Echocardiogram will be done to assess murmur heard on auscultation. Patient will be seen in follow-up appointment in 6 months or earlier if the patient has any concerns.    Medication Adjustments/Labs and Tests Ordered: Current medicines are reviewed at length with the patient today.  Concerns regarding medicines are outlined above.  Orders Placed This Encounter  Procedures   Comprehensive metabolic panel with GFR   D-dimer, quantitative   ECHOCARDIOGRAM COMPLETE   No orders of the defined types were placed in this encounter.    History of Present Illness:    Latasha Wood is a 68 y.o. female who is being seen today for the evaluation of dyspnea on exertion at the request of Arcadio Knuckles, MD. patient is a pleasant 68 year old female.  She has past medical history that is not much significant.  Patient mentions to me that she travels abroad significantly and has come back after several months of being abroad.  She is planning to go back again.  She was evaluated for chest pain and calcium  score was 0 and text results were excellent.  She mentions to me that she has shortness of breath  on exertion and is getting worse over it.  In the past several months.  No orthopnea or PND.  At the time of my evaluation, the patient is alert awake oriented and in no distress.  Past Medical History:  Diagnosis Date   Anemia    Anxiety    Blood transfusion    Colitis, ulcerative (HCC)    Colon polyps    Hyperlipidemia    Hypertension    Ileostomy present (HCC)    Numbness and tingling    hands and feet bilat    Shortness of breath dyspnea    talking or walking    Type 2 diabetes mellitus with complication, without long-term current use of insulin  (HCC) 09/05/2015   currently on no medications, (05/26/2016) pt denies diabetes.  States that she had one time high blood sugars d/t prednisone     Past Surgical History:  Procedure Laterality Date   ABDOMINAL HYSTERECTOMY  2000   ABDOMINAL SURGERY     BREAST CYST ASPIRATION     CATARACT EXTRACTION W/PHACO Right 10/20/2017   Procedure: CATARACT EXTRACTION PHACO AND INTRAOCULAR LENS PLACEMENT (IOC);  Surgeon: Rosa College, MD;  Location: [facility] ORS;  Service: Ophthalmology;  Laterality: Right;  US  00:50.1 AP% 13.2 CDE 6.61 Fluid Pack lot # [id number]   COLON SURGERY     colostomy   cortisol shot     in left shoulder   DILATION AND CURETTAGE OF UTERUS  most likely after miscarriage   fibrocystic breast disease     q 6 month mammogram   gravida 6 para 2     all SAB   hyadiform mole     ILEO LOOP DIVERSION N/A 09/25/2015   Procedure: ILEO LOOP COLOSTOMY;  Surgeon: Joyce Nixon, MD;  Location: [facility];  Service: General;  Laterality: N/A;   ILEOSTOMY CLOSURE N/A 10/08/2015   Procedure: ILEOSTOMY REVISION;  Surgeon: Joyce Nixon, MD;  Location: [facility];  Service: General;  Laterality: N/A;   ILEOSTOMY CLOSURE N/A 08/20/2016   Procedure: OPEN RELOCATION OF ILEOSTOMY;  Surgeon: Joyce Nixon, MD;  Location: [facility];  Service: General;  Laterality: N/A;   IR GENERIC HISTORICAL  09/01/2016   IR US  GUIDE VASC ACCESS RIGHT 09/01/2016  Prudence Brown, PA-C [facility]   IR GENERIC HISTORICAL  09/01/2016   IR FLUORO GUIDE CV LINE RIGHT 09/01/2016 Prudence Brown, PA-C [facility]   KNEE ARTHROSCOPY W/ MENISCAL REPAIR     right knee ([place])   LAPAROSCOPIC SMALL BOWEL RESECTION N/A 09/25/2015   Procedure: LAPAROSCOPIC BOWEL RESECTION TIMES TWO;  Surgeon: Joyce Nixon, MD;  Location: [facility];  Service: General;  Laterality: N/A;   ROBOTIC ASSISTED LAPAROSCOPIC LYSIS OF ADHESION N/A 09/25/2015   Procedure: XI ROBOTIC ASSISTED LAPAROSCOPIC LYSIS OF ADHESION;  Surgeon: Joyce Nixon, MD;  Location: [facility];  Service: General;  Laterality: N/A;  90 minutes   Small bowel obstruction      Current Medications: Current Meds  Medication Sig   ALPRAZolam  (XANAX ) 0.25 MG tablet Take 1 tablet (0.25 mg total) by mouth 2 (two) times daily as needed for anxiety.   atovaquone-proguanil (MALARONE) 250-100 MG TABS tablet Take 1 tablet by mouth daily.   cholecalciferol (VITAMIN D3) 25 MCG (1000 UNIT) tablet Take 1,000 Units by mouth daily.   Cyanocobalamin  (VITAMIN B-12 PO) Take 500 mcg by mouth daily.   hydrocortisone  2.5 % cream Apply topically 2 (two) times daily.   indapamide  (LOZOL ) 1.25 MG tablet Take 1 tablet (1.25 mg total) by mouth daily.   loperamide  (IMODIUM ) 2 MG capsule Take 2 mg by mouth as needed for diarrhea or loose stools.   losartan  (COZAAR ) 25 MG tablet Take 2 tablets (50 mg total) by mouth at bedtime.   meloxicam  (MOBIC ) 15 MG tablet Take 1 tablet (15 mg total) by mouth daily as needed for pain.   Multiple Vitamin (MULTIVITAMIN WITH MINERALS) TABS tablet Take 1 tablet by mouth daily.   potassium chloride  (KLOR-CON  10) 10 MEQ tablet Take 1 tablet (10 mEq total) by mouth 3 (three) times daily.     Allergies:   Betadine  [povidone iodine ], Latex, and Tape   Social History   Socioeconomic History   Marital status: Married    Spouse name: Not on file   Number of children: 2   Years of education: 34   Highest  education level: Professional school degree (e.g., MD, DDS, DVM, JD)  Occupational History   Occupation: customer relations    Employer: [employer]    Comment: [employer]  Tobacco Use   Smoking status: Never   Smokeless tobacco: Never  Substance and Sexual Activity   Alcohol use: Yes    Alcohol/week: 2.0 standard drinks of alcohol    Types: 2 Standard drinks or equivalent per week    Comment: occ   Drug use: No   Sexual activity: Yes    Partners: Male  Other Topics Concern   Not on file  Social History Narrative  HSG, [employer] - BA, [employer] for med tech.  Married '75. 2 dtrs - '79, '82; 1 grandchild. Marriage is in good health. No abuse issues.    Social Drivers of Corporate investment banker Strain: Low Risk  (04/14/2024)   Overall Financial Resource Strain (CARDIA)    Difficulty of Paying Living Expenses: Not hard at all  Food Insecurity: No Food Insecurity (04/14/2024)   Hunger Vital Sign    Worried About Running Out of Food in the Last Year: Never true    Ran Out of Food in the Last Year: Never true  Transportation Needs: No Transportation Needs (04/14/2024)   PRAPARE - Administrator, Civil Service (Medical): No    Lack of Transportation (Non-Medical): No  Physical Activity: Inactive (04/14/2024)   Exercise Vital Sign    Days of Exercise per Week: 0 days    Minutes of Exercise per Session: 0 min  Stress: No Stress Concern Present (04/14/2024)   [employer] of Occupational Health - Occupational Stress Questionnaire    Feeling of Stress : Not at all  Social Connections: Moderately Isolated (04/14/2024)   Social Connection and Isolation Panel [NHANES]    Frequency of Communication with Friends and Family: More than three times a week    Frequency of Social Gatherings with Friends and Family: More than three times a week    Attends Religious Services: Never    Database administrator or Organizations: No    Attends Engineer, structural: Never    Marital  Status: Married     Family History: The patient's family history includes Breast cancer in her maternal aunt; Breast cancer (age of onset: 54) in her sister; Breast cancer (age of onset: 24) in her mother; Colon cancer in her paternal aunt; Diabetes in her brother, father, mother, and sister; Heart disease in her father.  ROS:   Please see the history of present illness.    All other systems reviewed and are negative.  EKGs/Labs/Other Studies Reviewed:    The following studies were reviewed today: Prior EKG was within normal limits        Recent Labs: 03/06/2024: ALT 30; BUN 16; Creatinine, Ser 1.12; Hemoglobin 12.1; Platelets 305.0; Potassium 3.9; Sodium 137; TSH 1.00  Recent Lipid Panel    Component Value Date/Time   CHOL 171 03/06/2024 1020   CHOL 209 (H) 03/20/2020 1016   TRIG 68.0 03/06/2024 1020   HDL 110.00 03/06/2024 1020   HDL 91 03/20/2020 1016   CHOLHDL 2 03/06/2024 1020   VLDL 13.6 03/06/2024 1020   LDLCALC 48 03/06/2024 1020   LDLCALC 101 (H) 03/20/2020 1016   LDLDIRECT 114.0 04/29/2017 1012    Physical Exam:    VS:  BP 132/76   Pulse 78   Ht 5' 3.5 (1.613 m)   Wt 163 lb (73.9 kg)   SpO2 97%   BMI 28.42 kg/m     Wt Readings from Last 3 Encounters:  04/19/24 163 lb (73.9 kg)  04/14/24 166 lb (75.3 kg)  03/06/24 166 lb (75.3 kg)     GEN: Patient is in no acute distress HEENT: Normal NECK: No JVD; No carotid bruits LYMPHATICS: No lymphadenopathy CARDIAC: S1 S2 regular, 2/6 systolic murmur at the apex. RESPIRATORY:  Clear to auscultation without rales, wheezing or rhonchi  ABDOMEN: Soft, non-tender, non-distended MUSCULOSKELETAL:  No edema; No deformity  SKIN: Warm and dry NEUROLOGIC:  Alert and oriented x 3 PSYCHIATRIC:  Normal affect  Signed, Nelia Balzarine, MD  04/19/2024 3:10 PM    [facility]

## 2024-04-19 NOTE — Patient Instructions (Addendum)
 Medication Instructions:  Your physician recommends that you continue on your current medications as directed. Please refer to the Current Medication list given to you today.  *If you need a refill on your cardiac medications before your next appointment, please call your pharmacy*  Lab Work: None ordered.  You may go to any [facility] Location for your lab work:  [facility] - [address] ([facility]) - [phone] [address]  [place] - [address]   [place]  - [address]   [place] - [address] - [address] [employer] [place]  - [address] [place] - [address] ([facility]   If you have labs (blood work) drawn today and your tests are completely normal, you will receive your results only by: [employer] (if you have MyChart)  If you have any lab test that is abnormal or we need to change your treatment, we will call you or send a MyChart message to review the results.  Testing/Procedures: echocardiogram   Follow-Up: At [facility], you and your health needs are our priority.  As part of our continuing mission to provide you with exceptional heart care, we have created designated Provider Care Teams.  These Care Teams include your primary Cardiologist (physician) and Advanced Practice Providers (APPs -  Physician Assistants and Nurse Practitioners) who all work together to provide you with the care you need, when you need it.  Your next appointment:   1 year(s)  The format for your next appointment:   In Person  Provider:   Hillis Lu, MD

## 2024-04-20 ENCOUNTER — Ambulatory Visit: Payer: Self-pay | Admitting: Cardiology

## 2024-04-20 ENCOUNTER — Encounter (HOSPITAL_COMMUNITY): Payer: Self-pay

## 2024-04-20 ENCOUNTER — Observation Stay (HOSPITAL_COMMUNITY)
Admission: EM | Admit: 2024-04-20 | Discharge: 2024-04-22 | Disposition: A | Discharge status: Home / Self Care | Attending: Emergency Medicine | Admitting: Emergency Medicine

## 2024-04-20 ENCOUNTER — Emergency Department (HOSPITAL_COMMUNITY)

## 2024-04-20 ENCOUNTER — Other Ambulatory Visit: Payer: Self-pay

## 2024-04-20 DIAGNOSIS — D649 Anemia, unspecified: Secondary | ICD-10-CM | POA: Diagnosis not present

## 2024-04-20 DIAGNOSIS — I82412 Acute embolism and thrombosis of left femoral vein: Secondary | ICD-10-CM | POA: Diagnosis not present

## 2024-04-20 DIAGNOSIS — E041 Nontoxic single thyroid nodule: Secondary | ICD-10-CM | POA: Diagnosis not present

## 2024-04-20 DIAGNOSIS — Z79899 Other long term (current) drug therapy: Secondary | ICD-10-CM | POA: Insufficient documentation

## 2024-04-20 DIAGNOSIS — F419 Anxiety disorder, unspecified: Secondary | ICD-10-CM | POA: Diagnosis not present

## 2024-04-20 DIAGNOSIS — N183 Chronic kidney disease, stage 3 unspecified: Secondary | ICD-10-CM | POA: Diagnosis not present

## 2024-04-20 DIAGNOSIS — Z91040 Latex allergy status: Secondary | ICD-10-CM | POA: Insufficient documentation

## 2024-04-20 DIAGNOSIS — R0602 Shortness of breath: Secondary | ICD-10-CM | POA: Insufficient documentation

## 2024-04-20 DIAGNOSIS — J9811 Atelectasis: Secondary | ICD-10-CM | POA: Diagnosis not present

## 2024-04-20 DIAGNOSIS — I82503 Chronic embolism and thrombosis of unspecified deep veins of lower extremity, bilateral: Secondary | ICD-10-CM | POA: Diagnosis not present

## 2024-04-20 DIAGNOSIS — R0789 Other chest pain: Secondary | ICD-10-CM | POA: Diagnosis not present

## 2024-04-20 DIAGNOSIS — E876 Hypokalemia: Secondary | ICD-10-CM | POA: Diagnosis not present

## 2024-04-20 DIAGNOSIS — R079 Chest pain, unspecified: Secondary | ICD-10-CM

## 2024-04-20 DIAGNOSIS — E1122 Type 2 diabetes mellitus with diabetic chronic kidney disease: Secondary | ICD-10-CM | POA: Diagnosis not present

## 2024-04-20 DIAGNOSIS — E785 Hyperlipidemia, unspecified: Secondary | ICD-10-CM | POA: Diagnosis not present

## 2024-04-20 DIAGNOSIS — Z932 Ileostomy status: Secondary | ICD-10-CM | POA: Diagnosis not present

## 2024-04-20 DIAGNOSIS — I129 Hypertensive chronic kidney disease with stage 1 through stage 4 chronic kidney disease, or unspecified chronic kidney disease: Secondary | ICD-10-CM | POA: Diagnosis not present

## 2024-04-20 DIAGNOSIS — Z794 Long term (current) use of insulin: Secondary | ICD-10-CM | POA: Insufficient documentation

## 2024-04-20 DIAGNOSIS — I2699 Other pulmonary embolism without acute cor pulmonale: Principal | ICD-10-CM | POA: Insufficient documentation

## 2024-04-20 DIAGNOSIS — I10 Essential (primary) hypertension: Secondary | ICD-10-CM

## 2024-04-20 LAB — COMPREHENSIVE METABOLIC PANEL WITH GFR
ALT: 13 IU/L (ref 0–32)
ALT: 13 U/L (ref 0–44)
AST: 20 IU/L (ref 0–40)
AST: 20 U/L (ref 15–41)
Albumin: 4.1 g/dL (ref 3.9–4.9)
Albumin: 3 g/dL — ABNORMAL LOW (ref 3.5–5.0)
Alkaline Phosphatase: 107 IU/L (ref 44–121)
Alkaline Phosphatase: 71 U/L (ref 38–126)
Anion gap: 11 (ref 5–15)
BUN/Creatinine Ratio: 21 (ref 12–28)
BUN: 29 mg/dL — ABNORMAL HIGH (ref 8–27)
BUN: 32 mg/dL — ABNORMAL HIGH (ref 8–23)
Bilirubin Total: 0.4 mg/dL (ref 0.0–1.2)
CO2: 22 mmol/L (ref 20–29)
CO2: 23 mmol/L (ref 22–32)
Calcium: 9.7 mg/dL (ref 8.7–10.3)
Calcium: 8.5 mg/dL — ABNORMAL LOW (ref 8.9–10.3)
Chloride: 99 mmol/L (ref 96–106)
Chloride: 104 mmol/L (ref 98–111)
Creatinine, Ser: 1.37 mg/dL — ABNORMAL HIGH (ref 0.57–1.00)
Creatinine, Ser: 1 mg/dL (ref 0.44–1.00)
GFR, Estimated: >60 mL/min (ref >60)
Globulin, Total: 3.8 g/dL (ref 1.5–4.5)
Glucose, Bld: 104 mg/dL — ABNORMAL HIGH (ref 70–99)
Glucose: 75 mg/dL (ref 70–99)
Potassium: 3.6 mmol/L (ref 3.5–5.2)
Potassium: 3.3 mmol/L — ABNORMAL LOW (ref 3.5–5.1)
Sodium: 138 mmol/L (ref 134–144)
Sodium: 138 mmol/L (ref 135–145)
Total Bilirubin: 0.5 mg/dL (ref 0.0–1.2)
Total Protein: 7.9 g/dL (ref 6.0–8.5)
Total Protein: 6.8 g/dL (ref 6.5–8.1)
eGFR: 42 mL/min/{1.73_m2} — ABNORMAL LOW (ref >59)

## 2024-04-20 LAB — CBC WITH DIFFERENTIAL/PLATELET
Abs Immature Granulocytes: 0.02 10*3/uL (ref 0.00–0.07)
Basophils Absolute: 0 10*3/uL (ref 0.0–0.1)
Basophils Relative: 0 %
Eosinophils Absolute: 0.2 10*3/uL (ref 0.0–0.5)
Eosinophils Relative: 2 %
HCT: 34.4 % — ABNORMAL LOW (ref 36.0–46.0)
Hemoglobin: 11.4 g/dL — ABNORMAL LOW (ref 12.0–15.0)
Immature Granulocytes: 0 %
Lymphocytes Relative: 25 %
Lymphs Abs: 2.2 10*3/uL (ref 0.7–4.0)
MCH: 29.4 pg (ref 26.0–34.0)
MCHC: 33.1 g/dL (ref 30.0–36.0)
MCV: 88.7 fL (ref 80.0–100.0)
Monocytes Absolute: 0.7 10*3/uL (ref 0.1–1.0)
Monocytes Relative: 8 %
Neutro Abs: 5.7 10*3/uL (ref 1.7–7.7)
Neutrophils Relative %: 65 %
Platelets: 355 10*3/uL (ref 150–400)
RBC: 3.88 MIL/uL (ref 3.87–5.11)
RDW: 12.5 % (ref 11.5–15.5)
WBC: 8.9 10*3/uL (ref 4.0–10.5)
nRBC: 0 % (ref 0.0–0.2)

## 2024-04-20 LAB — TROPONIN I (HIGH SENSITIVITY): Troponin I (High Sensitivity): 2 ng/L (ref <18)

## 2024-04-20 LAB — D-DIMER, QUANTITATIVE
D-DIMER: 3.91 mg{FEU}/L — ABNORMAL HIGH (ref 0.00–0.49)
D-Dimer, Quant: 2.51 ug{FEU}/mL — ABNORMAL HIGH (ref 0.00–0.50)

## 2024-04-20 MED ORDER — IOHEXOL 350 MG/ML SOLN
75.0000 mL | Freq: Once | INTRAVENOUS | Status: AC | PRN
  Administered 2024-04-20: 75 mL via INTRAVENOUS

## 2024-04-20 MED ORDER — IOHEXOL 350 MG/ML SOLN: 75.0000 mL | Freq: Once | INTRAVENOUS | Status: DC | PRN

## 2024-04-20 NOTE — ED Triage Notes (Signed)
 Pt started becoming short of breath a few months ago. Had bloodwork done yesterday, d-dimer came back elevated at 3.91. pt appears soboe, no pain reported, no leg swelling reported

## 2024-04-20 NOTE — Telephone Encounter (Signed)
 Patient would like a call back to further discuss results.

## 2024-04-20 NOTE — Telephone Encounter (Signed)
 Pt is currently at the hospital. Pt had called back for the results and saw them in her MyChart.

## 2024-04-20 NOTE — Telephone Encounter (Signed)
 Pt will go to the ED for evaluation as recommended.

## 2024-04-20 NOTE — ED Provider Notes (Signed)
 [place] EMERGENCY DEPARTMENT AT [facility] Provider Note   CSN: [id number] Arrival date & time: 04/20/24  1505     Patient presents with: Shortness of Breath   Latasha Wood is a 68 y.o. female with past medical history of HLD, HTN, UC, GERD, OAB, CKD stage III presents emergency department for evaluation of chest pain, shortness of breath that has been present for the past few months but worsening over the past 3 weeks.  Chest pain is constant and worsens with deep breath and walking.  Reports significant decrease in pain distance since onset of symptoms.  Denies congestion, productive cough  Also has BLE pain for the past few months.    Was evaluated by cardiology yesterday who recommended ED evaluation for elevated dimer.  Last echo was in 2016.  Cardiology scheduled echo in July     Shortness of Breath      Prior to Admission medications   Medication Sig Start Date End Date Taking? Authorizing Provider  ALPRAZolam  (XANAX ) 0.25 MG tablet Take 1 tablet (0.25 mg total) by mouth 2 (two) times daily as needed for anxiety. 03/06/24  Yes Arcadio Knuckles, MD  cholecalciferol (VITAMIN D3) 25 MCG (1000 UNIT) tablet Take 1,000 Units by mouth daily.   Yes [provider]  hydrocortisone  2.5 % cream Apply topically 2 (two) times daily. Patient taking differently: Apply 1 Application topically 2 (two) times daily as needed. 03/10/24  Yes Arcadio Knuckles, MD  indapamide  (LOZOL ) 1.25 MG tablet Take 1 tablet (1.25 mg total) by mouth daily. 03/13/24  Yes Arcadio Knuckles, MD  losartan  (COZAAR ) 25 MG tablet Take 2 tablets (50 mg total) by mouth at bedtime. 04/07/24  Yes Arcadio Knuckles, MD  Multiple Vitamin (MULTIVITAMIN WITH MINERALS) TABS tablet Take 1 tablet by mouth daily.   Yes [provider]  potassium chloride  (KLOR-CON  10) 10 MEQ tablet Take 1 tablet (10 mEq total) by mouth 3 (three) times daily. Patient taking differently: Take 10 mEq by mouth 2 (two)  times daily. 03/10/24  Yes Arcadio Knuckles, MD  meloxicam  (MOBIC ) 15 MG tablet Take 1 tablet (15 mg total) by mouth daily as needed for pain. Patient not taking: Reported on 04/21/2024 03/31/23   Ulysees Gander, DO    Allergies: Betadine  [povidone iodine ], Latex, and Tape    Review of Systems  Respiratory:  Positive for shortness of breath.     Updated Vital Signs BP 118/73   Pulse 76   Temp 98.3 F (36.8 C)   Resp 18   SpO2 100%   Physical Exam Vitals and nursing note reviewed.  Constitutional:      General: She is not in acute distress.    Appearance: Normal appearance.  HENT:     Head: Normocephalic and atraumatic.   Eyes:     Conjunctiva/sclera: Conjunctivae normal.    Cardiovascular:     Rate and Rhythm: Normal rate.     Pulses:          Dorsalis pedis pulses are 2+ on the right side and 2+ on the left side.  Pulmonary:     Effort: Pulmonary effort is normal. No respiratory distress.     Comments: Mild shortness of breath with talking.  No current respiratory distress.  Lung sounds CTAB Abdominal:     General: The ostomy site is clean.     Comments: Ostomy bag in place   Musculoskeletal:     Comments: No gross swelling nor tenderness  to BLE.  Celine Collard' sign negative.  However, patient complains of BLE pain from behind knee down to calves   Skin:    Coloration: Skin is not jaundiced or pale.   Neurological:     Mental Status: She is alert. Mental status is at baseline.     (all labs ordered are listed, but only abnormal results are displayed) Labs Reviewed  D-DIMER, QUANTITATIVE - Abnormal; Notable for the following components:      Result Value   D-Dimer, Quant 2.51 (*)    All other components within normal limits  CBC WITH DIFFERENTIAL/PLATELET - Abnormal; Notable for the following components:   Hemoglobin 11.4 (*)    HCT 34.4 (*)    All other components within normal limits  COMPREHENSIVE METABOLIC PANEL WITH GFR - Abnormal; Notable for the following  components:   Potassium 3.3 (*)    Glucose, Bld 104 (*)    BUN 32 (*)    Calcium  8.5 (*)    Albumin  3.0 (*)    All other components within normal limits  CBC  TROPONIN I (HIGH SENSITIVITY)  TROPONIN I (HIGH SENSITIVITY)    EKG: None  Radiology: CT Angio Chest PE W and/or Wo Contrast Result Date: 04/20/2024 CLINICAL DATA:  Shortness of breath EXAM: CT ANGIOGRAPHY CHEST WITH CONTRAST TECHNIQUE: Multidetector CT imaging of the chest was performed using the standard protocol during bolus administration of intravenous contrast. Multiplanar CT image reconstructions and MIPs were obtained to evaluate the vascular anatomy. RADIATION DOSE REDUCTION: This exam was performed according to the departmental dose-optimization program which includes automated exposure control, adjustment of the mA and/or kV according to patient size and/or use of iterative reconstruction technique. CONTRAST:  75mL OMNIPAQUE  IOHEXOL  350 MG/ML SOLN COMPARISON:  Chest x-ray 10/20/2022, CT chest 06/24/2011 FINDINGS: Cardiovascular: Satisfactory opacification of the pulmonary arteries to the segmental level. Positive for acute bilateral pulmonary emboli. Small volume thrombus within right middle lobe segmental vessels. Positive for thrombus in right lower lobar, segmental and subsegmental vessels. Small volume thrombus within left upper lobe segmental and subsegmental vessels. Positive for thrombus within left lower lobar, segmental and subsegmental vessels. No evidence for right heart strain. RV LV ratio is 0.82. Nonaneurysmal aorta.  Normal cardiac size.  No pericardial effusion Mediastinum/Nodes: Patent trachea. Right thyroid  mass measuring 4 cm. Esophagus within normal limits. No suspicious lymph nodes Lungs/Pleura: No pleural effusion or pneumothorax. Dependent atelectasis Upper Abdomen: No acute finding in the upper abdomen Musculoskeletal: No acute osseous abnormality. Multilevel degenerative change Review of the MIP images  confirms the above findings. IMPRESSION: 1. Positive for acute bilateral pulmonary emboli. No CT evidence for right heart strain. 2. 4 cm right thyroid  mass. Recommend thyroid  US  (ref: J Am Coll Radiol. 2015 Feb;12(2): 143-50).This should be performed on a nonemergent basis. Critical Value/emergent results were called by telephone at the time of interpretation on 04/20/2024 at 11:55 pm to provider Dr. Alayne Hubert, Who verbally acknowledged these results. Electronically Signed   By: Esmeralda Hedge M.D.   On: 04/20/2024 23:56     Procedures   Medications Ordered in the ED  heparin  bolus via infusion 4,000 Units (has no administration in time range)  heparin  ADULT infusion 100 units/mL (25000 units/250mL) (has no administration in time range)  iohexol  (OMNIPAQUE ) 350 MG/ML injection 75 mL (75 mLs Intravenous Contrast Given 04/20/24 2313)  Medical Decision Making Amount and/or Complexity of Data Reviewed Labs: ordered. Radiology: ordered.  Risk Prescription drug management. Decision regarding hospitalization.   Patient presents to the ED for concern of CP, SHOB, this involves an extensive number of treatment options, and is a complaint that carries with it a high risk of complications and morbidity.  The differential diagnosis includes ACS, PE, pneumonia, fluid overload, COPD exacerbation, asthma, hypoxia   Co morbidities that complicate the patient evaluation  HPI   Additional history obtained:  Additional history obtained from Nursing   External records from outside source obtained and reviewed including triage note, cardiology note from yesterday   Lab Tests:  I Ordered, and personally interpreted labs.  The pertinent results include:   Hgb 11.4 Potassium 3.3 CBG 104 BUN 32 (close 29 2 days ago) Dimer 2.51   Imaging Studies ordered:  I ordered imaging studies including CTPE  I independently visualized and interpreted imaging which showed  bilateral PE wo rh strain I agree with the radiologist interpretation   Cardiac Monitoring:  The patient was maintained on a cardiac monitor.     Medicines ordered and prescription drug management:  I ordered medication including heparin   for PE  Reevaluation of the patient after these medicines showed that the patient improved I have reviewed the patients home medicines and have made adjustments as needed    Critical Interventions:  Heparin  administration for bilateral PEs   Consultations Obtained:  I requested consultation with hospitalist,  and discussed lab and imaging findings as well as pertinent plan - Dr. Andy Bannister accepts patient for admission   Problem List / ED Course:  Bilateral PE wo RH strain Maintain oxygen saturation without supplementation No right heart strain per CT normal EKG.  Troponin negative x 2 Placed pharmacy consult for heparin  Think patient would benefit from echo, DVT US  during admission Will have hospitalist admit   Reevaluation:  After the interventions noted above, I reevaluated the patient and found that they have :stayed the same    Dispostion:  After consideration of the diagnostic results and the patients response to treatment, I feel that the patent would benefit from admission for IV heparin .   l discussed ED workup, disposition with patient expresses understanding of the plan  Dr. Gordon Latus individually assessed patient, reviewed ED workup and agrees with plan  Final diagnoses:  Acute pulmonary embolism without acute cor pulmonale, unspecified pulmonary embolism type (HCC)  Shortness of breath  Chest pain, unspecified type    ED Discharge Orders     None          Royann Cords, PA 04/21/24 1478    Arvilla Birmingham, MD 04/21/24 1558

## 2024-04-20 NOTE — ED Notes (Signed)
 Lab said the light green hemolyzed.  Please recollect.

## 2024-04-21 ENCOUNTER — Other Ambulatory Visit (HOSPITAL_COMMUNITY): Payer: Self-pay

## 2024-04-21 ENCOUNTER — Telehealth (HOSPITAL_COMMUNITY): Payer: Self-pay | Admitting: Pharmacy Technician

## 2024-04-21 ENCOUNTER — Inpatient Hospital Stay (HOSPITAL_BASED_OUTPATIENT_CLINIC_OR_DEPARTMENT_OTHER)

## 2024-04-21 DIAGNOSIS — I2699 Other pulmonary embolism without acute cor pulmonale: Secondary | ICD-10-CM | POA: Diagnosis not present

## 2024-04-21 LAB — COMPREHENSIVE METABOLIC PANEL WITH GFR
ALT: 13 U/L (ref 0–44)
AST: 18 U/L (ref 15–41)
Albumin: 3.2 g/dL — ABNORMAL LOW (ref 3.5–5.0)
Alkaline Phosphatase: 80 U/L (ref 38–126)
Anion gap: 11 (ref 5–15)
BUN: 29 mg/dL — ABNORMAL HIGH (ref 8–23)
CO2: 24 mmol/L (ref 22–32)
Calcium: 8.8 mg/dL — ABNORMAL LOW (ref 8.9–10.3)
Chloride: 99 mmol/L (ref 98–111)
Creatinine, Ser: 1.13 mg/dL — ABNORMAL HIGH (ref 0.44–1.00)
GFR, Estimated: 53 mL/min — ABNORMAL LOW (ref >60)
Glucose, Bld: 131 mg/dL — ABNORMAL HIGH (ref 70–99)
Potassium: 2.8 mmol/L — ABNORMAL LOW (ref 3.5–5.1)
Sodium: 134 mmol/L — ABNORMAL LOW (ref 135–145)
Total Bilirubin: 0.5 mg/dL (ref 0.0–1.2)
Total Protein: 6.9 g/dL (ref 6.5–8.1)

## 2024-04-21 LAB — RETICULOCYTES
Immature Retic Fract: 9.7 % (ref 2.3–15.9)
RBC.: 4.11 MIL/uL (ref 3.87–5.11)
Retic Count, Absolute: 54.7 10*3/uL (ref 19.0–186.0)
Retic Ct Pct: 1.3 % (ref 0.4–3.1)

## 2024-04-21 LAB — FOLATE: Folate: 16.4 ng/mL (ref >5.9)

## 2024-04-21 LAB — GLUCOSE, CAPILLARY
Glucose-Capillary: 91 mg/dL (ref 70–99)
Glucose-Capillary: 86 mg/dL (ref 70–99)

## 2024-04-21 LAB — CBC
HCT: 34.3 % — ABNORMAL LOW (ref 36.0–46.0)
Hemoglobin: 11.2 g/dL — ABNORMAL LOW (ref 12.0–15.0)
MCH: 29.2 pg (ref 26.0–34.0)
MCHC: 32.7 g/dL (ref 30.0–36.0)
MCV: 89.3 fL (ref 80.0–100.0)
Platelets: 323 10*3/uL (ref 150–400)
RBC: 3.84 MIL/uL — ABNORMAL LOW (ref 3.87–5.11)
RDW: 12.4 % (ref 11.5–15.5)
WBC: 9.1 10*3/uL (ref 4.0–10.5)
nRBC: 0 % (ref 0.0–0.2)

## 2024-04-21 LAB — IRON AND TIBC
Iron: 81 ug/dL (ref 28–170)
Saturation Ratios: 23 % (ref 10.4–31.8)
TIBC: 357 ug/dL (ref 250–450)
UIBC: 276 ug/dL

## 2024-04-21 LAB — CBG MONITORING, ED
Glucose-Capillary: 86 mg/dL (ref 70–99)
Glucose-Capillary: 102 mg/dL — ABNORMAL HIGH (ref 70–99)

## 2024-04-21 LAB — VITAMIN B12: Vitamin B-12: 429 pg/mL (ref 180–914)

## 2024-04-21 LAB — FERRITIN: Ferritin: 79 ng/mL (ref 11–307)

## 2024-04-21 LAB — HIV ANTIBODY (ROUTINE TESTING W REFLEX): HIV Screen 4th Generation wRfx: NONREACTIVE

## 2024-04-21 LAB — HEPARIN LEVEL (UNFRACTIONATED)
Heparin Unfractionated: 1.02 [IU]/mL — ABNORMAL HIGH (ref 0.30–0.70)
Heparin Unfractionated: 0.59 [IU]/mL (ref 0.30–0.70)

## 2024-04-21 LAB — TROPONIN I (HIGH SENSITIVITY): Troponin I (High Sensitivity): <2 ng/L (ref <18)

## 2024-04-21 LAB — HEMOGLOBIN A1C
Hgb A1c MFr Bld: 5.6 % (ref 4.8–5.6)
Mean Plasma Glucose: 114 mg/dL

## 2024-04-21 LAB — ANTITHROMBIN III: AntiThromb III Func: 102 % (ref 75–120)

## 2024-04-21 MED ORDER — ACETAMINOPHEN 650 MG RE SUPP: 650.0000 mg | Freq: Four times a day (QID) | RECTAL | Status: DC | PRN

## 2024-04-21 MED ORDER — HEPARIN BOLUS VIA INFUSION
4000.0000 [IU] | Freq: Once | INTRAVENOUS | Status: AC
  Administered 2024-04-21: 4000 [IU] via INTRAVENOUS
  Filled 2024-04-21: qty 4000

## 2024-04-21 MED ORDER — POTASSIUM CHLORIDE ER 10 MEQ PO TBCR
10.0000 meq | EXTENDED_RELEASE_TABLET | Freq: Two times a day (BID) | ORAL | Status: DC
  Administered 2024-04-21 – 2024-04-22 (×4): 10 meq via ORAL
  Filled 2024-04-21 (×7): qty 1

## 2024-04-21 MED ORDER — ALBUTEROL SULFATE (2.5 MG/3ML) 0.083% IN NEBU: 2.5000 mg | INHALATION_SOLUTION | RESPIRATORY_TRACT | Status: DC | PRN

## 2024-04-21 MED ORDER — ACETAMINOPHEN 325 MG PO TABS
650.0000 mg | ORAL_TABLET | Freq: Four times a day (QID) | ORAL | Status: DC | PRN
Start: 2024-04-21 — End: 2024-04-22

## 2024-04-21 MED ORDER — INDAPAMIDE 1.25 MG PO TABS
1.2500 mg | ORAL_TABLET | Freq: Every day | ORAL | Status: DC
  Administered 2024-04-21 – 2024-04-22 (×2): 1.25 mg via ORAL
  Filled 2024-04-21 (×2): qty 1

## 2024-04-21 MED ORDER — ONDANSETRON HCL 4 MG PO TABS: 4.0000 mg | ORAL_TABLET | Freq: Four times a day (QID) | ORAL | Status: DC | PRN

## 2024-04-21 MED ORDER — ALPRAZOLAM 0.25 MG PO TABS: 0.2500 mg | ORAL_TABLET | Freq: Two times a day (BID) | ORAL | Status: DC | PRN

## 2024-04-21 MED ORDER — HEPARIN SODIUM (PORCINE) 5000 UNIT/ML IJ SOLN
5000.0000 [IU] | Freq: Three times a day (TID) | INTRAMUSCULAR | Status: DC
Start: 2024-04-21 — End: 2024-04-21

## 2024-04-21 MED ORDER — ONDANSETRON HCL 4 MG/2ML IJ SOLN: 4.0000 mg | Freq: Four times a day (QID) | INTRAMUSCULAR | Status: DC | PRN

## 2024-04-21 MED ORDER — HEPARIN (PORCINE) 25000 UT/250ML-% IV SOLN
1200.0000 [IU]/h | INTRAVENOUS | Status: DC
  Administered 2024-04-21: 1200 [IU]/h via INTRAVENOUS
  Filled 2024-04-21: qty 250

## 2024-04-21 MED ORDER — LOSARTAN POTASSIUM 50 MG PO TABS
50.0000 mg | ORAL_TABLET | Freq: Every day | ORAL | Status: DC
  Administered 2024-04-21 (×2): 50 mg via ORAL
  Filled 2024-04-21 (×2): qty 1

## 2024-04-21 MED ORDER — HEPARIN (PORCINE) 25000 UT/250ML-% IV SOLN
1000.0000 [IU]/h | INTRAVENOUS | Status: DC
  Administered 2024-04-21: 1000 [IU]/h via INTRAVENOUS
  Filled 2024-04-21: qty 250

## 2024-04-21 MED ORDER — INSULIN ASPART 100 UNIT/ML IJ SOLN
0.0000 [IU] | Freq: Three times a day (TID) | INTRAMUSCULAR | Status: DC
  Filled 2024-04-21: qty 0.06

## 2024-04-21 NOTE — Progress Notes (Signed)
 PHARMACY - ANTICOAGULATION CONSULT NOTE  Pharmacy Consult for heparin  Indication: pulmonary embolus  Allergies  Allergen Reactions   Betadine  [Povidone Iodine ]    Latex Rash    Redness of skin   Tape Rash    Redness of skin OK with hypofix tape and paper tape! NO transpore tape ,adhesive tape!    Patient Measurements:    Vital Signs: Temp: 97.7 F (36.5 C) (06/13 1130) Temp Source: Oral (06/13 1130) BP: 122/69 (06/13 1130) Pulse Rate: 61 (06/13 1130)  Labs: Recent Labs    04/19/24 1518 04/20/24 1543 04/20/24 1737 04/20/24 2131 04/20/24 2340 04/21/24 0458 04/21/24 0730  HGB  --  11.4*  --   --   --  11.2*  --   HCT  --  34.4*  --   --   --  34.3*  --   PLT  --  355  --   --   --  323  --   HEPARINUNFRC  --   --   --   --   --   --  1.02*  CREATININE 1.37*  --  1.00  --   --  1.13*  --   TROPONINIHS  --   --   --  2 <2  --   --     Estimated Creatinine Clearance: 45.8 mL/min (A) (by C-G formula based on SCr of 1.13 mg/dL (H)).   Medical History: Past Medical History:  Diagnosis Date   Anemia    Anxiety    Blood transfusion    Colitis, ulcerative (HCC)    Colon polyps    Hyperlipidemia    Hypertension    Ileostomy present (HCC)    Numbness and tingling    hands and feet bilat    Shortness of breath dyspnea    talking or walking    Type 2 diabetes mellitus with complication, without long-term current use of insulin  (HCC) 09/05/2015   currently on no medications, (05/26/2016) pt denies diabetes.  States that she had one time high blood sugars d/t prednisone      Assessment:  68 y.o. female with past medical history of HLD, HTN, UC, GERD, OAB, CKD stage III presents to ED for evaluation of chest pain, shortness of breath , pharmacy to dose heparin  for PE, no prior AC noted  6/12 DDimer elevated at 2.51  Today, 04/21/24 HGb 11.2, plts 323 - stable 07:30 Heparin  level = 1.02, supra-therapeutic, with IV heparin  infusing at  1200 units/hr No bleeding or  heparin  related issues reported by nurse   Goal of Therapy:  Heparin  level 0.3-0.7 units/ml Monitor platelets by anticoagulation protocol: Yes   Plan:  Hold IV Heparin  infusion for one hour then re-start IV heparin  at a decreased rate of 1000 units/hr continuous infusion 6 hour Heparin  level following re-start of infusion Monitor daily heparin  level, CBC, signs/symptoms of bleeding    Thank you for allowing pharmacy to be a part of this patient's care.  Alfredo Inch, PharmD, BCPS Clinical Pharmacist [place] 04/21/2024 12:24 PM

## 2024-04-21 NOTE — ED Notes (Signed)
 Admitting MD at [facility].

## 2024-04-21 NOTE — Care Management Obs Status (Signed)
 MEDICARE OBSERVATION STATUS NOTIFICATION   Patient Details  Name: Latasha Wood MRN: [id number] Date of Birth: [birth year]   Medicare Observation Status Notification Given:  Yes    Jabier Martens, LCSW 04/21/2024, 12:02 PM

## 2024-04-21 NOTE — ED Notes (Signed)
 Received call from lab requesting more blood samples/ tubes

## 2024-04-21 NOTE — ED Notes (Signed)
 Pt alert, NAD, calm, interactive, resps e/u, steady gait to b/r. Heparin  infusing.

## 2024-04-21 NOTE — Progress Notes (Signed)
 Same day note  Latasha Wood is a 68 y.o. female with medical history significant for anxiety, Anemia, Ulcerative Colitis s/p ileostomy, HLD, HTN, SOB, DMII who presented  to ED with history of progressive shortness of breath and dyspnea over the last few months. She was referred to cardiology, was was evaluated and found to have  + d-dimer and was referred to ED for further evaluation.  Patient has been having pain in her bilateral lower extremities and has history of frequent travel back to some discomfort.  In the ED, vitals were stable.  Hemoglobin of 11.4.  D-dimer was elevated at 2.5.  Potassium was low at 3.3.  EKG showed normal sinus rhythm.  CTA of the chest showed bilateral pulmonary embolism with no cardiac strain.  Patient was put on heparin  drip and was admitted hospital for further evaluation and treatment.   Patient seen and examined at bedside.  Patient was admitted to the hospital for shortness of breath dyspnea on exertion.  At the time of my evaluation, patient complains of feeling a little better with breathing.  Was able to go to the bathroom once  Physical examination reveals right calf tenderness.  Obese built not in distress.  Laboratory data and imaging was reviewed  Assessment and Plan.  Acute bilateral pulmonary embolism. On heparin  drip.  Transition to oral anticoagulation.  Bilateral venous Doppler pending.  Check 2D echocardiogram.  Hypercoagulable workup was sent by the admitting provider.   Hypokalemia. Potassium of 2.8 today.  Will aggressively replenish.   Anxiety -resume home regimen    Anemia Iron studies have been sent.  Will follow.  Latest hemoglobin 11.2.   History of ulcerative colitis status post ileostomy.  History of hyperlipidemia No medication list reviewed in MAR.   History of hypertension Continue Cozaar .   Diabetes mellitus type 2 Continue sliding scale insulin  Accu-Cheks diabetic diet.  Check hemoglobin A1c  No  Charge  Signed,  Lindwood Rhody, MD [facility]

## 2024-04-21 NOTE — ED Notes (Signed)
 Patient reports feeling dizzy and lightheaded after moving around the room.

## 2024-04-21 NOTE — Care Management CC44 (Signed)
 Condition Code 44 Documentation Completed  Patient Details  Name: Latasha Wood MRN: [id number] Date of Birth: 1956/07/25   Condition Code 44 given:  Yes Patient signature on Condition Code 44 notice:  Yes Documentation of 2 MD's agreement:  Yes Code 44 added to claim:  Yes    Jabier Martens, LCSW 04/21/2024, 12:02 PM

## 2024-04-21 NOTE — Progress Notes (Signed)
   04/21/24 1432  TOC Brief Assessment  Insurance and Status Reviewed  Patient has primary care physician Yes  Home environment has been reviewed Home w/ spouse  Prior level of function: Independent  Prior/Current Home Services No current home services  Social Drivers of Health Review SDOH reviewed no interventions necessary  Readmission risk has been reviewed Yes  Transition of care needs transition of care needs identified, TOC will continue to follow

## 2024-04-21 NOTE — Progress Notes (Signed)
 VASCULAR LAB    Bilateral lower extremity venous duplex has been performed.  See CV proc for preliminary results.   Jaaziel Peatross, RVT 04/21/2024, 9:08 AM

## 2024-04-21 NOTE — ED Notes (Signed)
 Ambulated pt while monitoring spO2; levels went to 100% but did not drop lower than 94%, notified RN

## 2024-04-21 NOTE — ED Notes (Signed)
 3 lavenders, 3 blue tubes sent to lab

## 2024-04-21 NOTE — Progress Notes (Signed)
 PHARMACY - ANTICOAGULATION CONSULT NOTE  Pharmacy Consult for heparin  Indication: pulmonary embolus  Allergies  Allergen Reactions   Betadine  [Povidone Iodine ]    Latex Rash    Redness of skin   Tape Rash    Redness of skin OK with hypofix tape and paper tape! NO transpore tape ,adhesive tape!    Patient Measurements:    Vital Signs: Temp: 98.7 F (37.1 C) (06/12 1928) Temp Source: Oral (06/12 1521) BP: 112/66 (06/12 2330) Pulse Rate: 77 (06/12 2330)  Labs: Recent Labs    04/19/24 1518 04/20/24 1543 04/20/24 1737 04/20/24 2131 04/20/24 2340  HGB  --  11.4*  --   --   --   HCT  --  34.4*  --   --   --   PLT  --  355  --   --   --   CREATININE 1.37*  --  1.00  --   --   TROPONINIHS  --   --   --  2 <2    Estimated Creatinine Clearance: 51.7 mL/min (by C-G formula based on SCr of 1 mg/dL).   Medical History: Past Medical History:  Diagnosis Date   Anemia    Anxiety    Blood transfusion    Colitis, ulcerative (HCC)    Colon polyps    Hyperlipidemia    Hypertension    Ileostomy present (HCC)    Numbness and tingling    hands and feet bilat    Shortness of breath dyspnea    talking or walking    Type 2 diabetes mellitus with complication, without long-term current use of insulin  (HCC) 09/05/2015   currently on no medications, (05/26/2016) pt denies diabetes.  States that she had one time high blood sugars d/t prednisone      Assessment:  68 y.o. female with past medical history of HLD, HTN, UC, GERD, OAB, CKD stage III presents to ED for evaluation of chest pain, shortness of breath , pharmacy to dose heparin  for PE, no prior AC noted  HGb 11.4, plts 355, DDimer 2.51  Goal of Therapy:  Heparin  level 0.3-0.7 units/ml Monitor platelets by anticoagulation protocol: Yes   Plan:  Heparin  bolus 4000 units x1 Start heparin  drip at 1200 units/hr Heparin  level in 6 hours Daily CBC   Beau Bound RPh 04/21/2024, 12:17 AM

## 2024-04-21 NOTE — ED Provider Notes (Incomplete)
 [place] EMERGENCY DEPARTMENT AT [facility] Provider Note   CSN: [id number] Arrival date & time: 04/20/24  1505     Patient presents with: Shortness of Breath   Latasha Wood is a 68 y.o. female  Cardiology called patient d/t elevated dimer  Shob with exertion short distances for  few months but worsening over past three weeks. Bilateral lle pain. CP worsens with deep breath and walking for last mo. Chest pressure constant with worsening  Congested and productive cough for past sox mo. Feels similar to allergies  Osteomy f/u on 16th   {Add pertinent medical, surgical, social history, OB history to WUJ:81191}  Shortness of Breath      Prior to Admission medications   Medication Sig Start Date End Date Taking? Authorizing Provider  ALPRAZolam  (XANAX ) 0.25 MG tablet Take 1 tablet (0.25 mg total) by mouth 2 (two) times daily as needed for anxiety. 03/06/24   Arcadio Knuckles, MD  atovaquone-proguanil (MALARONE) 250-100 MG TABS tablet Take 1 tablet by mouth daily. 03/30/23   [provider]  cholecalciferol (VITAMIN D3) 25 MCG (1000 UNIT) tablet Take 1,000 Units by mouth daily.    [provider]  Cyanocobalamin  (VITAMIN B-12 PO) Take 500 mcg by mouth daily.    [provider]  hydrocortisone  2.5 % cream Apply topically 2 (two) times daily. 03/10/24   Arcadio Knuckles, MD  indapamide  (LOZOL ) 1.25 MG tablet Take 1 tablet (1.25 mg total) by mouth daily. 03/13/24   Arcadio Knuckles, MD  loperamide  (IMODIUM ) 2 MG capsule Take 2 mg by mouth as needed for diarrhea or loose stools.    [provider]  losartan  (COZAAR ) 25 MG tablet Take 2 tablets (50 mg total) by mouth at bedtime. 04/07/24   Arcadio Knuckles, MD  meloxicam  (MOBIC ) 15 MG tablet Take 1 tablet (15 mg total) by mouth daily as needed for pain. 03/31/23   Ulysees Gander, DO  Multiple Vitamin (MULTIVITAMIN WITH MINERALS) TABS tablet Take 1 tablet by mouth daily.    [provider]  potassium chloride  (KLOR-CON  10) 10 MEQ tablet Take 1 tablet (10 mEq total) by mouth 3 (three) times daily. 03/10/24   Arcadio Knuckles, MD    Allergies: Betadine  [povidone iodine ], Latex, and Tape    Review of Systems  Respiratory:  Positive for shortness of breath.     Updated Vital Signs BP 116/83 (BP Location: Right Arm)   Pulse 80   Temp 98.7 F (37.1 C)   Resp 20   SpO2 100%   Physical Exam Vitals and nursing note reviewed.  Constitutional:      General: She is not in acute distress.    Appearance: Normal appearance.  HENT:     Head: Normocephalic and atraumatic.   Eyes:     Conjunctiva/sclera: Conjunctivae normal.    Cardiovascular:     Rate and Rhythm: Normal rate.  Pulmonary:     Effort: Pulmonary effort is normal. No respiratory distress.  Abdominal:     General: The ostomy site is clean.     Comments: Ostomy bag in place   Skin:    Coloration: Skin is not jaundiced or pale.   Neurological:     Mental Status: She is alert. Mental status is at baseline.     (all labs ordered are listed, but only abnormal results are displayed) Labs Reviewed  D-DIMER, QUANTITATIVE - Abnormal; Notable for the following components:      Result Value  D-Dimer, Quant 2.51 (*)    All other components within normal limits  CBC WITH DIFFERENTIAL/PLATELET - Abnormal; Notable for the following components:   Hemoglobin 11.4 (*)    HCT 34.4 (*)    All other components within normal limits  COMPREHENSIVE METABOLIC PANEL WITH GFR - Abnormal; Notable for the following components:   Potassium 3.3 (*)    Glucose, Bld 104 (*)    BUN 32 (*)    Calcium  8.5 (*)    Albumin  3.0 (*)    All other components within normal limits    EKG: None  Radiology: No results found.  {Document cardiac monitor, telemetry assessment procedure when appropriate:32947} Procedures   Medications Ordered in the ED - No data to display    {Click here for ABCD2, HEART and other  calculators REFRESH Note before signing:1}                              Medical Decision Making Amount and/or Complexity of Data Reviewed Radiology: ordered.  Risk Prescription drug management.   ***  {Document critical care time when appropriate  Document review of labs and clinical decision tools ie CHADS2VASC2, etc  Document your independent review of radiology images and any outside records  Document your discussion with family members, caretakers and with consultants  Document social determinants of health affecting pt's care  Document your decision making why or why not admission, treatments were needed:32947:::1}   Final diagnoses:  None    ED Discharge Orders     None

## 2024-04-21 NOTE — Plan of Care (Signed)

## 2024-04-21 NOTE — Progress Notes (Signed)
 PHARMACY - ANTICOAGULATION CONSULT NOTE  Pharmacy Consult for heparin  Indication: pulmonary embolus  Allergies  Allergen Reactions   Betadine  [Povidone Iodine ]    Latex Rash    Redness of skin   Tape Rash    Redness of skin OK with hypofix tape and paper tape! NO transpore tape ,adhesive tape!    Patient Measurements:    Vital Signs: Temp: 98 F (36.7 C) (06/13 1341) Temp Source: Oral (06/13 1341) BP: 97/67 (06/13 1341) Pulse Rate: 71 (06/13 1341)  Labs: Recent Labs    04/19/24 1518 04/20/24 1543 04/20/24 1737 04/20/24 2131 04/20/24 2340 04/21/24 0458 04/21/24 0730 04/21/24 1925  HGB  --  11.4*  --   --   --  11.2*  --   --   HCT  --  34.4*  --   --   --  34.3*  --   --   PLT  --  355  --   --   --  323  --   --   HEPARINUNFRC  --   --   --   --   --   --  1.02* 0.59  CREATININE 1.37*  --  1.00  --   --  1.13*  --   --   TROPONINIHS  --   --   --  2 <2  --   --   --     Estimated Creatinine Clearance: 45.8 mL/min (A) (by C-G formula based on SCr of 1.13 mg/dL (H)).   Medical History: Past Medical History:  Diagnosis Date   Anemia    Anxiety    Blood transfusion    Colitis, ulcerative (HCC)    Colon polyps    Hyperlipidemia    Hypertension    Ileostomy present (HCC)    Numbness and tingling    hands and feet bilat    Pulmonary embolism (HCC)    Shortness of breath dyspnea    talking or walking    Type 2 diabetes mellitus with complication, without long-term current use of insulin  (HCC) 09/05/2015   currently on no medications, (05/26/2016) pt denies diabetes.  States that she had one time high blood sugars d/t prednisone      Assessment:  68 y.o. female with past medical history of HLD, HTN, UC, GERD, OAB, CKD stage III presents to ED for evaluation of chest pain, shortness of breath , pharmacy to dose heparin  for PE, no prior AC noted  6/12 DDimer elevated at 2.51  Today, 04/21/24 HGb 11.2, plts 323 - stable 07:30 Heparin  level = 1.02,  supra-therapeutic, with IV heparin  infusing at 1200 units/hr  This evening, 19:25 Heparin  level = 0.59, therapeutic, with IV heparin  infusing at 1000 units/hr No bleeding or heparin  related issues reported by nurse   Goal of Therapy:  Heparin  level 0.3-0.7 units/ml Monitor platelets by anticoagulation protocol: Yes   Plan:  Continue IV heparin  at 1000 units/hr continuous infusion 6 hour confirmatory Heparin  level Monitor daily heparin  level, CBC, signs/symptoms of bleeding    Thank you for allowing pharmacy to be a part of this patient's care.  Alfredo Inch, PharmD, BCPS Clinical Pharmacist [place] 04/21/2024 8:10 PM

## 2024-04-21 NOTE — H&P (Signed)
 History and Physical    Latasha Wood:[id number] DOB: May 16, 1956 DOA: 04/20/2024  PCP: Arcadio Knuckles, MD  Patient coming from: home  I have personally briefly reviewed patient's old medical records in [facility]  Chief Complaint: sob/doe x few months , recent eval note + d-dimer 3.91   HPI: Latasha Wood is a 68 y.o. female with medical history significant of  Anxiety, Anemia, Ulcerative Colitis s/p ileostomy, HLD, HTN, SOB, DMII Who presents to ED with history of progressive sob/doe over the last few months, for which she was referred to cardiology.  Patient was evaluated and found to have  + d-dimer and was referred to ED for further evaluation. ON further questioning patient admitted to pain in b/l lower extremities and history of frequent travel. Patient also noted chest pain which she describes as pressure. She states he has had this for some time but more noticeable over the last 2-3 weeks. Patient notes no fever, but has had mild congestion, and progressive doe and fatigue.  ED Course:  In ED patient was found to have b/l PE without heart strain  Patient was then placed on heparin  drip and slated for admission   Afeb, BP 121/82, rr 18 shr 90 sat 99% on ra  Wbc 8.9, hgb 11.4, plt 355 D-dimer 2.51 Na 138, K 3.3, cl 104, cr 1 CE2 CTPE IMPRESSION: 1. Positive for acute bilateral pulmonary emboli. No CT evidence for right heart strain. 2. 4 cm right thyroid  mass. Recommend thyroid  US  (ref: J Am Coll Radiol. 2015 Feb;12(2): 143-50).This should be performed on a nonemergent basis.   EKG NSR  Review of Systems: As per HPI otherwise 10 point review of systems negative.   Past Medical History:  Diagnosis Date   Anemia    Anxiety    Blood transfusion    Colitis, ulcerative (HCC)    Colon polyps    Hyperlipidemia    Hypertension    Ileostomy present (HCC)    Numbness and tingling    hands and feet bilat    Shortness of breath dyspnea    talking or  walking    Type 2 diabetes mellitus with complication, without long-term current use of insulin  (HCC) 09/05/2015   currently on no medications, (05/26/2016) pt denies diabetes.  States that she had one time high blood sugars d/t prednisone     Past Surgical History:  Procedure Laterality Date   ABDOMINAL HYSTERECTOMY  2000   ABDOMINAL SURGERY     BREAST CYST ASPIRATION     CATARACT EXTRACTION W/PHACO Right 10/20/2017   Procedure: CATARACT EXTRACTION PHACO AND INTRAOCULAR LENS PLACEMENT (IOC);  Surgeon: Rosa College, MD;  Location: [facility] ORS;  Service: Ophthalmology;  Laterality: Right;  US  00:50.1 AP% 13.2 CDE 6.61 Fluid Pack lot # [id number]   COLON SURGERY     colostomy   cortisol shot     in left shoulder   DILATION AND CURETTAGE OF UTERUS     most likely after miscarriage   fibrocystic breast disease     q 6 month mammogram   gravida 6 para 2     all SAB   hyadiform mole     ILEO LOOP DIVERSION N/A 09/25/2015   Procedure: ILEO LOOP COLOSTOMY;  Surgeon: Joyce Nixon, MD;  Location: [facility];  Service: General;  Laterality: N/A;   ILEOSTOMY CLOSURE N/A 10/08/2015   Procedure: ILEOSTOMY REVISION;  Surgeon: Joyce Nixon, MD;  Location: [facility];  Service: General;  Laterality: N/A;  ILEOSTOMY CLOSURE N/A 08/20/2016   Procedure: OPEN RELOCATION OF ILEOSTOMY;  Surgeon: Joyce Nixon, MD;  Location: [facility];  Service: General;  Laterality: N/A;   IR GENERIC HISTORICAL  09/01/2016   IR US  GUIDE VASC ACCESS RIGHT 09/01/2016 Prudence Brown, PA-C [facility]   IR GENERIC HISTORICAL  09/01/2016   IR FLUORO GUIDE CV LINE RIGHT 09/01/2016 Prudence Brown, PA-C [facility]   KNEE ARTHROSCOPY W/ MENISCAL REPAIR     right knee ([place]orf)   LAPAROSCOPIC SMALL BOWEL RESECTION N/A 09/25/2015   Procedure: LAPAROSCOPIC BOWEL RESECTION TIMES TWO;  Surgeon: Joyce Nixon, MD;  Location: [facility];  Service: General;  Laterality: N/A;   ROBOTIC ASSISTED LAPAROSCOPIC LYSIS OF ADHESION N/A 09/25/2015    Procedure: XI ROBOTIC ASSISTED LAPAROSCOPIC LYSIS OF ADHESION;  Surgeon: Joyce Nixon, MD;  Location: [facility];  Service: General;  Laterality: N/A;  90 minutes   Small bowel obstruction       reports that she has never smoked. She has never used smokeless tobacco. She reports current alcohol use of about 2.0 standard drinks of alcohol per week. She reports that she does not use drugs.  Allergies  Allergen Reactions   Betadine  [Povidone Iodine ]    Latex Rash    Redness of skin   Tape Rash    Redness of skin OK with hypofix tape and paper tape! NO transpore tape ,adhesive tape!    Family History  Problem Relation Age of Onset   Diabetes Mother    Breast cancer Mother 64   Diabetes Father    Heart disease Father    Diabetes Sister    Diabetes Brother    Breast cancer Sister 54   Colon cancer Paternal Aunt    Breast cancer Maternal Aunt     Prior to Admission medications   Medication Sig Start Date End Date Taking? Authorizing Provider  ALPRAZolam  (XANAX ) 0.25 MG tablet Take 1 tablet (0.25 mg total) by mouth 2 (two) times daily as needed for anxiety. 03/06/24  Yes Arcadio Knuckles, MD  cholecalciferol (VITAMIN D3) 25 MCG (1000 UNIT) tablet Take 1,000 Units by mouth daily.   Yes [provider]  hydrocortisone  2.5 % cream Apply topically 2 (two) times daily. Patient taking differently: Apply 1 Application topically 2 (two) times daily as needed. 03/10/24  Yes Arcadio Knuckles, MD  indapamide  (LOZOL ) 1.25 MG tablet Take 1 tablet (1.25 mg total) by mouth daily. 03/13/24  Yes Arcadio Knuckles, MD  losartan  (COZAAR ) 25 MG tablet Take 2 tablets (50 mg total) by mouth at bedtime. 04/07/24  Yes Arcadio Knuckles, MD  Multiple Vitamin (MULTIVITAMIN WITH MINERALS) TABS tablet Take 1 tablet by mouth daily.   Yes [provider]  potassium chloride  (KLOR-CON  10) 10 MEQ tablet Take 1 tablet (10 mEq total) by mouth 3 (three) times daily. Patient taking differently: Take 10 mEq by mouth 2  (two) times daily. 03/10/24  Yes Arcadio Knuckles, MD  meloxicam  (MOBIC ) 15 MG tablet Take 1 tablet (15 mg total) by mouth daily as needed for pain. Patient not taking: Reported on 04/21/2024 03/31/23   Ulysees Gander, DO    Physical Exam: Vitals:   04/20/24 2148 04/20/24 2159 04/20/24 2330 04/21/24 0035  BP: 110/62  112/66 118/73  Pulse:  82 77 76  Resp:   18 18  Temp:    98.3 F (36.8 C)  TempSrc:      SpO2:  99% 100% 100%    Constitutional: NAD, calm, comfortable  Vitals:   04/20/24 2148 04/20/24 2159 04/20/24 2330 04/21/24 0035  BP: 110/62  112/66 118/73  Pulse:  82 77 76  Resp:   18 18  Temp:    98.3 F (36.8 C)  TempSrc:      SpO2:  99% 100% 100%   Eyes: PERRL, lids and conjunctivae normal ENMT: Mucous membranes are moist. Posterior pharynx clear of any exudate or lesions.Normal dentition.  Neck: normal, supple, no masses, no thyromegaly Respiratory: clear to auscultation bilaterally, no wheezing, no crackles. Normal respiratory effort. No accessory muscle use.  Cardiovascular: Regular rate and rhythm, no murmurs / rubs / gallops. No extremity edema. 2+ pedal pulses.  Abdomen: no tenderness, no masses palpated. No hepatosplenomegaly. Bowel sounds positive.  Musculoskeletal: no clubbing / cyanosis. No joint deformity upper and lower extremities. Good ROM, no contractures. Normal muscle tone.  Skin: no rashes, lesions, ulcers. No induration Neurologic: CN 2-12 grossly intact. Sensation intact, Strength 5/5 in all 4.  Psychiatric: Normal judgment and insight. Alert and oriented x 3. Normal mood.    Labs on Admission: I have personally reviewed following labs and imaging studies  CBC: Recent Labs  Lab 04/20/24 1543  WBC 8.9  NEUTROABS 5.7  HGB 11.4*  HCT 34.4*  MCV 88.7  PLT 355   Basic Metabolic Panel: Recent Labs  Lab 04/19/24 1518 04/20/24 1737  NA 138 138  K 3.6 3.3*  CL 99 104  CO2 22 23  GLUCOSE 75 104*  BUN 29* 32*  CREATININE 1.37* 1.00  CALCIUM   9.7 8.5*   GFR: Estimated Creatinine Clearance: 51.7 mL/min (by C-G formula based on SCr of 1 mg/dL). Liver Function Tests: Recent Labs  Lab 04/19/24 1518 04/20/24 1737  AST 20 20  ALT 13 13  ALKPHOS 107 71  BILITOT 0.4 0.5  PROT 7.9 6.8  ALBUMIN  4.1 3.0*   No results for input(s): LIPASE, AMYLASE in the last 168 hours. No results for input(s): AMMONIA in the last 168 hours. Coagulation Profile: No results for input(s): INR, PROTIME in the last 168 hours. Cardiac Enzymes: No results for input(s): CKTOTAL, CKMB, CKMBINDEX, TROPONINI in the last 168 hours. BNP (last 3 results) No results for input(s): PROBNP in the last 8760 hours. HbA1C: No results for input(s): HGBA1C in the last 72 hours. CBG: No results for input(s): GLUCAP in the last 168 hours. Lipid Profile: No results for input(s): CHOL, HDL, LDLCALC, TRIG, CHOLHDL, LDLDIRECT in the last 72 hours. Thyroid  Function Tests: No results for input(s): TSH, T4TOTAL, FREET4, T3FREE, THYROIDAB in the last 72 hours. Anemia Panel: No results for input(s): VITAMINB12, FOLATE, FERRITIN, TIBC, IRON, RETICCTPCT in the last 72 hours. Urine analysis:    Component Value Date/Time   COLORURINE YELLOW 03/06/2024 1020   APPEARANCEUR CLEAR 03/06/2024 1020   LABSPEC 1.015 03/06/2024 1020   PHURINE 6.0 03/06/2024 1020   GLUCOSEU NEGATIVE 03/06/2024 1020   HGBUR NEGATIVE 03/06/2024 1020   BILIRUBINUR NEGATIVE 03/06/2024 1020   KETONESUR NEGATIVE 03/06/2024 1020   PROTEINUR NEGATIVE 01/15/2020 0100   UROBILINOGEN 0.2 03/06/2024 1020   NITRITE NEGATIVE 03/06/2024 1020   LEUKOCYTESUR NEGATIVE 03/06/2024 1020    Radiological Exams on Admission: CT Angio Chest PE W and/or Wo Contrast Result Date: 04/20/2024 CLINICAL DATA:  Shortness of breath EXAM: CT ANGIOGRAPHY CHEST WITH CONTRAST TECHNIQUE: Multidetector CT imaging of the chest was performed using the standard protocol during  bolus administration of intravenous contrast. Multiplanar CT image reconstructions and MIPs were obtained to evaluate the vascular anatomy. RADIATION  DOSE REDUCTION: This exam was performed according to the departmental dose-optimization program which includes automated exposure control, adjustment of the mA and/or kV according to patient size and/or use of iterative reconstruction technique. CONTRAST:  75mL OMNIPAQUE  IOHEXOL  350 MG/ML SOLN COMPARISON:  Chest x-ray 10/20/2022, CT chest 06/24/2011 FINDINGS: Cardiovascular: Satisfactory opacification of the pulmonary arteries to the segmental level. Positive for acute bilateral pulmonary emboli. Small volume thrombus within right middle lobe segmental vessels. Positive for thrombus in right lower lobar, segmental and subsegmental vessels. Small volume thrombus within left upper lobe segmental and subsegmental vessels. Positive for thrombus within left lower lobar, segmental and subsegmental vessels. No evidence for right heart strain. RV LV ratio is 0.82. Nonaneurysmal aorta.  Normal cardiac size.  No pericardial effusion Mediastinum/Nodes: Patent trachea. Right thyroid  mass measuring 4 cm. Esophagus within normal limits. No suspicious lymph nodes Lungs/Pleura: No pleural effusion or pneumothorax. Dependent atelectasis Upper Abdomen: No acute finding in the upper abdomen Musculoskeletal: No acute osseous abnormality. Multilevel degenerative change Review of the MIP images confirms the above findings. IMPRESSION: 1. Positive for acute bilateral pulmonary emboli. No CT evidence for right heart strain. 2. 4 cm right thyroid  mass. Recommend thyroid  US  (ref: J Am Coll Radiol. 2015 Feb;12(2): 143-50).This should be performed on a nonemergent basis. Critical Value/emergent results were called by telephone at the time of interpretation on 04/20/2024 at 11:55 pm to provider Dr. Alayne Hubert, Who verbally acknowledged these results. Electronically Signed   By: Esmeralda Hedge M.D.   On:  04/20/2024 23:56    EKG: Independently reviewed.   Assessment/Plan  Positive for acute bilateral pulmonary emboli -admit to med tele  - continue with heparin  drip  -echo in am  -transition to oral anticoagulation in am if no contraindication  - b/l venous dopplers pending  - f/u on protein C and S levels     Anxiety -resume home regimen   Anemia -monitor labs  -check iron studies   Ulcerative Colitis  -s/p ileostomy   HLD -resume home regimen   HTN - resume cozaar ,   DMII -iss/fs  DVT prophylaxis: heparin  drip Code Status: full/ as discussed per patient wishes in event of cardiac arrest  Family Communication: none at bedside Disposition Plan: full/ as discussed per patient wishes in event of cardiac arrest  Consults called: n/a Admission status: med tele   Sabas Cradle MD [facility]   If 7PM-7AM, please contact night-coverage [email or web address] Password [facility]  04/21/2024, 12:49 AM

## 2024-04-21 NOTE — Telephone Encounter (Signed)
 Patient Product/process development scientist completed.    The patient is insured through [facility]. Patient has Medicare and is not eligible for a copay card, but may be able to apply for patient assistance or Medicare RX Payment Plan (Patient Must reach out to their plan, if eligible for payment plan), if available.    Ran test claim for Eliquis 5 mg and the current 30 day co-pay is $302.00 due to a $255.00 deductible.  Will be $47.00 once deductible is met.  Ran test claim for Xarelto 20 mg and the current 30 day co-pay is $302.00 due to a $255.00 deductible.  Will be $47.00 once deductible is met.  This test claim was processed through [facility]- copay amounts may vary at other pharmacies due to pharmacy/plan contracts, or as the patient moves through the different stages of their insurance plan.     Roland Earl, CPHT Pharmacy Technician III Certified Patient Advocate [facility] Patient Advocate Team Direct Number: [phone]  Fax: [phone]

## 2024-04-21 NOTE — Hospital Course (Addendum)
 Same day note  Latasha Wood is a 68 y.o. female with medical history significant for anxiety, Anemia, Ulcerative Colitis s/p ileostomy, HLD, HTN, SOB, DMII who presented  to ED with history of progressive shortness of breath and dyspnea over the last few months. She was referred to cardiology, was was evaluated and found to have  + d-dimer and was referred to ED for further evaluation.  Patient has been having pain in her bilateral lower extremities and has history of frequent travel back to some discomfort.  In the ED, vitals were stable.  Hemoglobin of 11.4.  D-dimer was elevated at 2.5.  Potassium was low at 3.3.  EKG showed normal sinus rhythm.  CTA of the chest showed bilateral pulmonary embolism with no cardiac strain.  Patient was put on heparin  drip and was admitted hospital for further evaluation and treatment.   Patient seen and examined at bedside.  Patient was admitted to the hospital for shortness of breath dyspnea on exertion.  At the time of my evaluation, patient complains of feeling a little better with breathing.  Was able to go to the bathroom once  Physical examination reveals right calf tenderness.  Obese built not in distress.  Laboratory data and imaging was reviewed  Assessment and Plan.  Acute bilateral pulmonary embolism. On heparin  drip.  Transition to oral anticoagulation.  Bilateral venous Doppler pending.  Check 2D echocardiogram.  Hypercoagulable workup was sent by the admitting provider.   Hypokalemia. Potassium of 2.8 today.  Will aggressively replenish.   Anxiety -resume home regimen    Anemia Iron studies have been sent.  Will follow.  Latest hemoglobin 11.2.   History of ulcerative colitis status post ileostomy.  History of hyperlipidemia No medication list reviewed in MAR.   History of hypertension Continue Cozaar .   Diabetes mellitus type 2 Continue sliding scale insulin  Accu-Cheks diabetic diet.  Check hemoglobin A1c  No  Charge  Signed,  Lindwood Rhody, MD [facility]

## 2024-04-22 ENCOUNTER — Encounter (HOSPITAL_COMMUNITY): Payer: Self-pay | Admitting: Internal Medicine

## 2024-04-22 ENCOUNTER — Observation Stay (HOSPITAL_BASED_OUTPATIENT_CLINIC_OR_DEPARTMENT_OTHER)

## 2024-04-22 DIAGNOSIS — I2609 Other pulmonary embolism with acute cor pulmonale: Secondary | ICD-10-CM | POA: Diagnosis not present

## 2024-04-22 DIAGNOSIS — I2699 Other pulmonary embolism without acute cor pulmonale: Secondary | ICD-10-CM | POA: Diagnosis not present

## 2024-04-22 DIAGNOSIS — R0602 Shortness of breath: Secondary | ICD-10-CM | POA: Diagnosis not present

## 2024-04-22 LAB — ECHOCARDIOGRAM COMPLETE
AR max vel: 2.2 cm2
AV Area VTI: 2.21 cm2
AV Area mean vel: 2.11 cm2
AV Mean grad: 4 mmHg
AV Peak grad: 8.8 mmHg
Ao pk vel: 1.48 m/s
Area-P 1/2: 2.5 cm2
Height: 63.504 in
S' Lateral: 1.6 cm
Weight: 2608 [oz_av]

## 2024-04-22 LAB — BASIC METABOLIC PANEL WITH GFR
Anion gap: 11 (ref 5–15)
BUN: 26 mg/dL — ABNORMAL HIGH (ref 8–23)
CO2: 23 mmol/L (ref 22–32)
Calcium: 9.1 mg/dL (ref 8.9–10.3)
Chloride: 104 mmol/L (ref 98–111)
Creatinine, Ser: 1.2 mg/dL — ABNORMAL HIGH (ref 0.44–1.00)
GFR, Estimated: 49 mL/min — ABNORMAL LOW (ref >60)
Glucose, Bld: 86 mg/dL (ref 70–99)
Potassium: 3.6 mmol/L (ref 3.5–5.1)
Sodium: 138 mmol/L (ref 135–145)

## 2024-04-22 LAB — HEPARIN LEVEL (UNFRACTIONATED): Heparin Unfractionated: 0.66 [IU]/mL (ref 0.30–0.70)

## 2024-04-22 LAB — CBC
HCT: 37.5 % (ref 36.0–46.0)
Hemoglobin: 12 g/dL (ref 12.0–15.0)
MCH: 28.8 pg (ref 26.0–34.0)
MCHC: 32 g/dL (ref 30.0–36.0)
MCV: 90.1 fL (ref 80.0–100.0)
Platelets: 366 10*3/uL (ref 150–400)
RBC: 4.16 MIL/uL (ref 3.87–5.11)
RDW: 12.3 % (ref 11.5–15.5)
WBC: 8 10*3/uL (ref 4.0–10.5)
nRBC: 0 % (ref 0.0–0.2)

## 2024-04-22 LAB — BETA-2-GLYCOPROTEIN I ABS, IGG/M/A
Beta-2 Glyco I IgG: 15 GPI IgG units (ref 0–20)
Beta-2-Glycoprotein I IgA: <9 GPI IgA units (ref 0–25)
Beta-2-Glycoprotein I IgM: <9 GPI IgM units (ref 0–32)

## 2024-04-22 LAB — HOMOCYSTEINE: Homocysteine: 11.2 umol/L (ref 0.0–17.2)

## 2024-04-22 LAB — GLUCOSE, CAPILLARY
Glucose-Capillary: 90 mg/dL (ref 70–99)
Glucose-Capillary: 132 mg/dL — ABNORMAL HIGH (ref 70–99)

## 2024-04-22 MED ORDER — APIXABAN 5 MG PO TABS: ORAL_TABLET | ORAL | 3 refills | Status: DC

## 2024-04-22 MED ORDER — POTASSIUM CHLORIDE ER 10 MEQ PO TBCR: 10.0000 meq | EXTENDED_RELEASE_TABLET | Freq: Two times a day (BID) | ORAL | Status: DC

## 2024-04-22 MED ORDER — APIXABAN 5 MG PO TABS: 5.0000 mg | ORAL_TABLET | Freq: Two times a day (BID) | ORAL | Status: DC

## 2024-04-22 MED ORDER — APIXABAN 5 MG PO TABS
10.0000 mg | ORAL_TABLET | Freq: Two times a day (BID) | ORAL | Status: DC
  Administered 2024-04-22: 10 mg via ORAL
  Filled 2024-04-22: qty 2

## 2024-04-22 NOTE — Progress Notes (Signed)
 PHARMACY - ANTICOAGULATION CONSULT NOTE  Pharmacy Consult for heparin  Indication: pulmonary embolus  Allergies  Allergen Reactions   Betadine  [Povidone Iodine ]    Latex Rash    Redness of skin   Tape Rash    Redness of skin OK with hypofix tape and paper tape! NO transpore tape ,adhesive tape!    Patient Measurements:    Vital Signs: Temp: 98.3 F (36.8 C) (06/14 0025) BP: 114/64 (06/14 0025) Pulse Rate: 79 (06/14 0025)  Labs: Recent Labs    04/19/24 1518 04/20/24 1543 04/20/24 1543 04/20/24 1737 04/20/24 2131 04/20/24 2340 04/21/24 0458 04/21/24 0730 04/21/24 1925 04/22/24 0249  HGB  --  11.4*   < >  --   --   --  11.2*  --   --  12.0  HCT  --  34.4*  --   --   --   --  34.3*  --   --  37.5  PLT  --  355  --   --   --   --  323  --   --  366  HEPARINUNFRC  --   --   --   --   --   --   --  1.02* 0.59 0.66  CREATININE 1.37*  --   --  1.00  --   --  1.13*  --   --   --   TROPONINIHS  --   --   --   --  2 <2  --   --   --   --    < > = values in this interval not displayed.    Estimated Creatinine Clearance: 45.8 mL/min (A) (by C-G formula based on SCr of 1.13 mg/dL (H)).   Medical History: Past Medical History:  Diagnosis Date   Anemia    Anxiety    Blood transfusion    Colitis, ulcerative (HCC)    Colon polyps    Hyperlipidemia    Hypertension    Ileostomy present (HCC)    Numbness and tingling    hands and feet bilat    Pulmonary embolism (HCC)    Shortness of breath dyspnea    talking or walking    Type 2 diabetes mellitus with complication, without long-term current use of insulin  (HCC) 09/05/2015   currently on no medications, (05/26/2016) pt denies diabetes.  States that she had one time high blood sugars d/t prednisone      Assessment:  68 y.o. female with past medical history of HLD, HTN, UC, GERD, OAB, CKD stage III presents to ED for evaluation of chest pain, shortness of breath , pharmacy to dose heparin  for PE, no prior AC noted  6/12  DDimer elevated at 2.51  Today, 04/22/24 HL 0.66 therapeutic on 1000 units/hr CBC WNL No bleeding reported   Goal of Therapy:  Heparin  level 0.3-0.7 units/ml Monitor platelets by anticoagulation protocol: Yes   Plan:  Continue IV heparin  at 1000 units/hr continuous infusion Monitor daily heparin  level, CBC, signs/symptoms of bleeding   Thank you for allowing pharmacy to be a part of this patient's care.  Beau Bound RPh 04/22/2024, 3:25 AM

## 2024-04-22 NOTE — Progress Notes (Signed)
 Echocardiogram 2D Echocardiogram has been performed.  Candida Vetter N Avari Gelles,RDCS 04/22/2024, 11:41 AM

## 2024-04-22 NOTE — Plan of Care (Signed)

## 2024-04-22 NOTE — Plan of Care (Signed)
 VSS. No c/o pain. BG 86. Heparin  gtt infusing at 10ml/hr. No evidence of bleeding. No acute events overnight.  Problem: Education: Goal: Ability to describe self-care measures that may prevent or decrease complications (Diabetes Survival Skills Education) will improve Outcome: Progressing   Problem: Metabolic: Goal: Ability to maintain appropriate glucose levels will improve Outcome: Progressing   Problem: Nutritional: Goal: Maintenance of adequate nutrition will improve Outcome: Progressing   Problem: Education: Goal: Knowledge of General Education information will improve Description: Including pain rating scale, medication(s)/side effects and non-pharmacologic comfort measures Outcome: Progressing   Problem: Clinical Measurements: Goal: Ability to maintain clinical measurements within normal limits will improve Outcome: Progressing Goal: Will remain free from infection Outcome: Progressing Goal: Cardiovascular complication will be avoided Outcome: Progressing   Problem: Safety: Goal: Ability to remain free from injury will improve Outcome: Progressing

## 2024-04-22 NOTE — Discharge Summary (Signed)
 Physician Discharge Summary  Latasha Wood NUU:[id number] DOB: September 01, 1956 DOA: 04/20/2024  PCP: Arcadio Knuckles, MD  Admit date: 04/20/2024 Discharge date: 04/23/2024  Admitted From: Home  Discharge disposition: Home   Recommendations for Outpatient Follow-Up:   Follow up with your primary care provider in one week.  Check CBC, BMP, magnesium  in the next visit Patient has been started on Eliquis for bilateral lower extremity DVT and pulmonary embolism.  Please reassess in future for duration of anticoagulation given multiple site DVT/possible referral to hematology. Hyper coagulation workup was also sent from admission is still pending at the time of discharge.    Discharge Diagnosis:   Principal Problem:   PE (pulmonary thromboembolism) (HCC) Active Problems:   Pulmonary emboli (HCC)   Discharge Condition: Improved.  Diet recommendation: Low sodium, heart healthy.    Wound care: None.  Code status: Full.   History of Present Illness:   Latasha Wood is a 68 y.o. female with medical history significant for anxiety, Anemia, Ulcerative Colitis s/p ileostomy, HLD, HTN, SOB, DMII who presented  to ED with history of progressive shortness of breath and dyspnea over the last few months. She was referred to cardiology, was was evaluated and found to have  + d-dimer and was referred to ED for further evaluation.  Patient has been having pain in her bilateral lower extremities and has history of frequent travel back to some discomfort.  In the ED, vitals were stable.  Hemoglobin of 11.4.  D-dimer was elevated at 2.5.  Potassium was low at 3.3.  EKG showed normal sinus rhythm.  CTA of the chest showed bilateral pulmonary embolism with no cardiac strain.  Patient was put on heparin  drip and was admitted hospital for further evaluation and treatment.   Hospital Course:   Following conditions were addressed during hospitalization as listed below,  Acute bilateral pulmonary  embolism. Bilateral lower extremity DVT. Patient was initially started on heparin  drip.  Bilateral venous Doppler showed acute DVT involving the right peroneal vein and acute DVT involving left proximal femoral and profunda vein..  2D echocardiogram showed preserved LV function without cardiac strain.Aaron Aas  Hypercoagulable workup was sent by the admitting provider which is still pending.   Hypokalemia.  Potassium 3.6 today after replacement.   Anxiety Continue Xanax  from home   Anemia  Latest hemoglobin 11.2.  Folic acid  within normal range, Ferritin of 70, iron of 81, vitamin B12 was 429   History of ulcerative colitis status post ileostomy. Stable  History of hyperlipidemia No medication list reviewed in MAR.   History of hypertension Continue Cozaar .   Diabetes mellitus type 2 Patient denies.  Not on medication.  Diet controlled.  Disposition.  At this time, patient is stable for disposition home with outpatient PCP and hematology follow-up  Medical Consultants:   None.  Procedures:    None Subjective:   Today, patient was seen and examined at bedside.  Denies any chest pain, shortness of breath or dyspnea.  Was able to ambulate without any issues.  Discharge Exam:   Vitals:   04/22/24 0513 04/22/24 0514  BP: 99/63 101/63  Pulse:  75  Resp: 15   Temp: 98.2 F (36.8 C)   SpO2:  99%   Vitals:   04/21/24 2050 04/22/24 0025 04/22/24 0513 04/22/24 0514  BP: 113/75 114/64 99/63 101/63  Pulse: 64 79  75  Resp: 18 18 15    Temp: 97.7 F (36.5 C) 98.3 F (36.8 C) 98.2 F (36.8 C)  TempSrc:      SpO2: 99% 98%  99%  Weight:    73.9 kg  Height:    5' 3.5 (1.613 m)   Body mass index is 28.42 kg/m.  General: Alert awake, not in obvious distress HENT: pupils equally reacting to light,  No scleral pallor or icterus noted. Oral mucosa is moist.  Chest:  Clear breath sounds.  No crackles or wheezes.  CVS: S1 &S2 heard. No murmur.  Regular rate and rhythm. Abdomen:  Soft, nontender, nondistended.  Bowel sounds are heard.   Extremities: No cyanosis, clubbing or edema.  Peripheral pulses are palpable. Psych: Alert, awake and oriented, normal mood CNS:  No cranial nerve deficits.  Power equal in all extremities.   Skin: Warm and dry.  No rashes noted.  The results of significant diagnostics from this hospitalization (including imaging, microbiology, ancillary and laboratory) are listed below for reference.     Diagnostic Studies:   VAS US  LOWER EXTREMITY VENOUS (DVT) Result Date: 04/22/2024  Lower Venous DVT Study Patient Name:  Latasha Wood  Date of Exam:   04/21/2024 Medical Rec #: [id number]            Accession #:    [id number] Date of Birth: 1956-08-28             Patient Gender: F Patient Age:   37 years Exam Location:  [facility] Procedure:      VAS US  LOWER EXTREMITY VENOUS (DVT) Referring Phys: Amador Bad Kaylee Trivett --------------------------------------------------------------------------------  Indications: Pulmonary embolism.  Performing Technologist: Carleene Chase RVS  Examination Guidelines: A complete evaluation includes B-mode imaging, spectral Doppler, color Doppler, and power Doppler as needed of all accessible portions of each vessel. Bilateral testing is considered an integral part of a complete examination. Limited examinations for reoccurring indications may be performed as noted. The reflux portion of the exam is performed with the patient in reverse Trendelenburg.  +---------+---------------+---------+-----------+----------+--------------+ RIGHT    CompressibilityPhasicitySpontaneityPropertiesThrombus Aging +---------+---------------+---------+-----------+----------+--------------+ CFV      Full           Yes      Yes                                 +---------+---------------+---------+-----------+----------+--------------+ SFJ      Full                                                         +---------+---------------+---------+-----------+----------+--------------+ FV Prox  Full                                                        +---------+---------------+---------+-----------+----------+--------------+ FV Mid   Full                                                        +---------+---------------+---------+-----------+----------+--------------+ FV DistalFull                                                        +---------+---------------+---------+-----------+----------+--------------+  PFV      Full                                                        +---------+---------------+---------+-----------+----------+--------------+ POP      Full           Yes      Yes                                 +---------+---------------+---------+-----------+----------+--------------+ PTV      Full                                                        +---------+---------------+---------+-----------+----------+--------------+ PERO     None                                         Acute          +---------+---------------+---------+-----------+----------+--------------+   +---------+---------------+---------+-----------+----------+--------------+ LEFT     CompressibilityPhasicitySpontaneityPropertiesThrombus Aging +---------+---------------+---------+-----------+----------+--------------+ CFV      Full           Yes      Yes                                 +---------+---------------+---------+-----------+----------+--------------+ SFJ      Full                                                        +---------+---------------+---------+-----------+----------+--------------+ FV Prox  Partial                                      Acute          +---------+---------------+---------+-----------+----------+--------------+ FV Mid   Full                                                         +---------+---------------+---------+-----------+----------+--------------+ FV DistalFull                                         Acute          +---------+---------------+---------+-----------+----------+--------------+ PFV      Partial                                                     +---------+---------------+---------+-----------+----------+--------------+ POP  Full           Yes      Yes                                 +---------+---------------+---------+-----------+----------+--------------+ PTV      Full                                                        +---------+---------------+---------+-----------+----------+--------------+ PERO     Full                                                        +---------+---------------+---------+-----------+----------+--------------+     Summary: RIGHT: - Findings consistent with acute deep vein thrombosis involving the right peroneal veins.  - No cystic structure found in the popliteal fossa.  LEFT: - Findings consistent with acute, non occlusive DVT noted in the left proximal femoral vein and profunda vein.  - No cystic structure found in the popliteal fossa.  *See table(s) above for measurements and observations. Electronically signed by Genny Kid MD on 04/22/2024 at 10:57:40 AM.    Final    CT Angio Chest PE W and/or Wo Contrast Result Date: 04/20/2024 CLINICAL DATA:  Shortness of breath EXAM: CT ANGIOGRAPHY CHEST WITH CONTRAST TECHNIQUE: Multidetector CT imaging of the chest was performed using the standard protocol during bolus administration of intravenous contrast. Multiplanar CT image reconstructions and MIPs were obtained to evaluate the vascular anatomy. RADIATION DOSE REDUCTION: This exam was performed according to the departmental dose-optimization program which includes automated exposure control, adjustment of the mA and/or kV according to patient size and/or use of iterative reconstruction technique.  CONTRAST:  75mL OMNIPAQUE  IOHEXOL  350 MG/ML SOLN COMPARISON:  Chest x-ray 10/20/2022, CT chest 06/24/2011 FINDINGS: Cardiovascular: Satisfactory opacification of the pulmonary arteries to the segmental level. Positive for acute bilateral pulmonary emboli. Small volume thrombus within right middle lobe segmental vessels. Positive for thrombus in right lower lobar, segmental and subsegmental vessels. Small volume thrombus within left upper lobe segmental and subsegmental vessels. Positive for thrombus within left lower lobar, segmental and subsegmental vessels. No evidence for right heart strain. RV LV ratio is 0.82. Nonaneurysmal aorta.  Normal cardiac size.  No pericardial effusion Mediastinum/Nodes: Patent trachea. Right thyroid  mass measuring 4 cm. Esophagus within normal limits. No suspicious lymph nodes Lungs/Pleura: No pleural effusion or pneumothorax. Dependent atelectasis Upper Abdomen: No acute finding in the upper abdomen Musculoskeletal: No acute osseous abnormality. Multilevel degenerative change Review of the MIP images confirms the above findings. IMPRESSION: 1. Positive for acute bilateral pulmonary emboli. No CT evidence for right heart strain. 2. 4 cm right thyroid  mass. Recommend thyroid  US  (ref: J Am Coll Radiol. 2015 Feb;12(2): 143-50).This should be performed on a nonemergent basis. Critical Value/emergent results were called by telephone at the time of interpretation on 04/20/2024 at 11:55 pm to provider Dr. Alayne Hubert, Who verbally acknowledged these results. Electronically Signed   By: Esmeralda Hedge M.D.   On: 04/20/2024 23:56     Labs:   Basic Metabolic Panel: Recent Labs  Lab 04/19/24 1518 04/20/24 1737 04/21/24 0458 04/22/24 1610  NA 138 138 134* 138  K 3.6 3.3* 2.8* 3.6  CL 99 104 99 104  CO2 22 23 24 23   GLUCOSE 75 104* 131* 86  BUN 29* 32* 29* 26*  CREATININE 1.37* 1.00 1.13* 1.20*  CALCIUM  9.7 8.5* 8.8* 9.1   GFR Estimated Creatinine Clearance: 43.1 mL/min (A) (by C-G  formula based on SCr of 1.2 mg/dL (H)). Liver Function Tests: Recent Labs  Lab 04/19/24 1518 04/20/24 1737 04/21/24 0458  AST 20 20 18   ALT 13 13 13   ALKPHOS 107 71 80  BILITOT 0.4 0.5 0.5  PROT 7.9 6.8 6.9  ALBUMIN  4.1 3.0* 3.2*   No results for input(s): LIPASE, AMYLASE in the last 168 hours. No results for input(s): AMMONIA in the last 168 hours. Coagulation profile No results for input(s): INR, PROTIME in the last 168 hours.  CBC: Recent Labs  Lab 04/20/24 1543 04/21/24 0458 04/22/24 0249  WBC 8.9 9.1 8.0  NEUTROABS 5.7  --   --   HGB 11.4* 11.2* 12.0  HCT 34.4* 34.3* 37.5  MCV 88.7 89.3 90.1  PLT 355 323 366   Cardiac Enzymes: No results for input(s): CKTOTAL, CKMB, CKMBINDEX, TROPONINI in the last 168 hours. BNP: Invalid input(s): POCBNP CBG: Recent Labs  Lab 04/21/24 1259 04/21/24 1647 04/21/24 2046 04/22/24 0802 04/22/24 1224  GLUCAP 102* 91 86 90 132*   D-Dimer Recent Labs    04/20/24 1543  DDIMER 2.51*   Hgb A1c Recent Labs    04/21/24 0458  HGBA1C 5.6   Lipid Profile No results for input(s): CHOL, HDL, LDLCALC, TRIG, CHOLHDL, LDLDIRECT in the last 72 hours. Thyroid  function studies No results for input(s): TSH, T4TOTAL, T3FREE, THYROIDAB in the last 72 hours.  Invalid input(s): FREET3 Anemia work up Recent Labs    04/21/24 0658 04/21/24 1925  VITAMINB12  --  429  FOLATE  --  16.4  FERRITIN  --  79  TIBC  --  357  IRON  --  81  RETICCTPCT 1.3  --    Microbiology No results found for this or any previous visit (from the past 240 hours).   Discharge Instructions:   Discharge Instructions     Diet - low sodium heart healthy   Complete by: As directed    Discharge instructions   Complete by: As directed    Follow-up with your primary care provider in 1 week.  Discuss about getting a hematology referral as outpatient to check for hypercoagulable conditions.  Continue blood thinners  for at least 3 months and seek medical attention for worsening symptoms or any bleeding   Increase activity slowly   Complete by: As directed       Allergies as of 04/22/2024       Reactions   Betadine  [povidone Iodine ]    Latex Rash   Redness of skin   Tape Rash   Redness of skin OK with hypofix tape and paper tape! NO transpore tape ,adhesive tape!        Medication List     STOP taking these medications    meloxicam  15 MG tablet Commonly known as: MOBIC        TAKE these medications    ALPRAZolam  0.25 MG tablet Commonly known as: XANAX  Take 1 tablet (0.25 mg total) by mouth 2 (two) times daily as needed for anxiety.   apixaban 5 MG Tabs tablet Commonly known as: ELIQUIS Take 2 tablets (10 mg total) by mouth 2 (two) times daily for 7  days, THEN 1 tablet (5 mg total) 2 (two) times daily. Start taking on: April 22, 2024   cholecalciferol 25 MCG (1000 UNIT) tablet Commonly known as: VITAMIN D3 Take 1,000 Units by mouth daily.   hydrocortisone  2.5 % cream Apply topically 2 (two) times daily. What changed:  how much to take when to take this reasons to take this   indapamide  1.25 MG tablet Commonly known as: LOZOL  Take 1 tablet (1.25 mg total) by mouth daily.   losartan  25 MG tablet Commonly known as: COZAAR  Take 2 tablets (50 mg total) by mouth at bedtime.   multivitamin with minerals Tabs tablet Take 1 tablet by mouth daily.   potassium chloride  10 MEQ tablet Commonly known as: Klor-Con  10 Take 1 tablet (10 mEq total) by mouth 2 (two) times daily.        Follow-up Information     Arcadio Knuckles, MD Follow up.   Specialty: Internal Medicine Contact information: [address] [place] [address] [phone]                  Time coordinating discharge: 39 minutes  Signed:  Kirkland Figg  [facility] 04/23/2024, 7:20 AM

## 2024-04-24 ENCOUNTER — Encounter: Payer: Self-pay | Admitting: Internal Medicine

## 2024-04-24 ENCOUNTER — Telehealth: Payer: Self-pay

## 2024-04-24 ENCOUNTER — Ambulatory Visit: Admitting: Physician Assistant

## 2024-04-24 ENCOUNTER — Encounter: Payer: Self-pay | Admitting: Physician Assistant

## 2024-04-24 ENCOUNTER — Encounter: Payer: Self-pay | Admitting: Cardiology

## 2024-04-24 VITALS — BP 100/60 | HR 88 | Ht 63.5 in | Wt 165.0 lb

## 2024-04-24 DIAGNOSIS — R1084 Generalized abdominal pain: Secondary | ICD-10-CM

## 2024-04-24 DIAGNOSIS — B3789 Other sites of candidiasis: Secondary | ICD-10-CM

## 2024-04-24 DIAGNOSIS — Z8719 Personal history of other diseases of the digestive system: Secondary | ICD-10-CM

## 2024-04-24 DIAGNOSIS — K5100 Ulcerative (chronic) pancolitis without complications: Secondary | ICD-10-CM

## 2024-04-24 LAB — PROTEIN C, TOTAL: Protein C, Total: 131 % (ref 60–150)

## 2024-04-24 LAB — CARDIOLIPIN ANTIBODIES, IGG, IGM, IGA
Anticardiolipin IgA: <9 U/mL (ref 0–11)
Anticardiolipin IgG: <9 GPL U/mL (ref 0–14)
Anticardiolipin IgM: 9 [MPL'U]/mL (ref 0–12)

## 2024-04-24 MED ORDER — NYSTATIN 100000 UNIT/GM EX CREA: 1.0000 | TOPICAL_CREAM | Freq: Two times a day (BID) | CUTANEOUS | 1 refills | Status: AC

## 2024-04-24 NOTE — Progress Notes (Signed)
 Brigitte Canard, PA-C [address] [place], [place]  [address] Phone: [phone]   Gastroenterology Consultation  Referring Provider:     Arcadio Knuckles, MD Primary Care Physician:  Arcadio Knuckles, MD Primary Gastroenterologist:  Brigitte Canard, PA-C / Alvester Johnson, MD  Reason for Consultation:     Abdominal Pain, ulcerative colitis        HPI:   Latasha Wood is a 68 y.o. y/o female referred for consultation & management  by Arcadio Knuckles, MD.    Current Symptoms:  She has a Rash and Itching in the Gluteal Cleft and around the anus.  It is moist.  She has noticed mild bright red blood on the tissue when wiping.   She tried hydrocortisone  cream which helped her rectal itching.  She has skin Irritation, and mild blood around her ostomy.  She saw [facility] in the past few weeks who checked her ostomy.  She has mild intermittent abdominal cramping and discomfort.  Denies weight loss.  She just returned from [place] in April and was there 10 months. She is planning to travel back to [place] 05/23/24. She and her husband are building a vacation home in [place]. She is trying to get all her medical appointments done before she returns to [place].  She last saw Dr. General Kenner in 2017 in our office.  Previous patient Dr. Grandville Lax.  History of left-sided ulcerative colitis with some pancolonic activity on colonoscopy May/2016.  Originally diagnosed with ulcerative colitis at age 78.  She had been on Lialda , , and Humira  in the past but stopped Humira  when she was found to have antibodies to it and ultimately lost response. She had a prior history of multiple courses of steroids.   In November 2016 patient elected to have colectomy because she was concerned about long-term potential risk of biologic therapy.  She had colectomy in December, but was readmitted following necrosis of the ileostomy.  She required laparotomy.  Postop day 8 developed low output fistula from  abdominal wall.  February 2017 was readmitted to the hospital for dehydration and high ostomy output.  Was seen at ostomy clinic in [place] for dermatitis complications.  05/2016 last EGD by Dr. General Kenner: Benign appearing plaque at the GE junction.  Otherwise normal esophagus, and stomach.  Nodules in the duodenum consistent with benign ectopic gastric mucosa.  Biopsies showed chronic gastritis, otherwise normal.  No dysplasia.  03/2015 last Colonoscopy (on Humira ): Pan ulcerative colitis in the entire colon.  Mucosa was bleeding, friable, edematous.  No polyps.  Biopsy showed chronic mildly active colitis.  No dysplasia.  PMH: History of pulmonary embolism,DVTs, currently on Eliquis, thyroid  mass, diabetes, hypertension, GERD, history of UC, OAB, stage III CKD, hypokalemia, anemia, chronic pain.    She was recently admitted to [facility] 04/20/2024 until 04/23/2024 for bilateral lower extremity DVTs and pulmonary embolism.  Was started on Eliquis.  Referred to hematology for further workup.  Was also found to have a 4 cm right thyroid  mass.  04/22/2024 labs: Normal hemoglobin 12.0, WBC 8.0, MCV 90, platelet 366, BUN 26, creatinine 1.20, potassium 3.6.  04/2022 RUQ ultrasound: Normal.  No gallstones.  Normal liver.  CBD 1.5 mm.  Past Medical History:  Diagnosis Date   Anemia    Anxiety    Arthritis    Blood transfusion    Bowel obstruction (HCC)    Colitis, ulcerative (HCC)    Colon polyps  DVT (deep venous thrombosis) (HCC)    Hyperlipidemia    Hypertension    Ileostomy present (HCC)    Kidney disease    Numbness and tingling    hands and feet bilat    Pulmonary embolism (HCC)    Shortness of breath dyspnea    talking or walking    Type 2 diabetes mellitus with complication, without long-term current use of insulin  (HCC) 09/05/2015   currently on no medications, (05/26/2016) pt denies diabetes.  States that she had one time high blood sugars d/t prednisone     Past Surgical  History:  Procedure Laterality Date   ABDOMINAL HYSTERECTOMY  2000   ABDOMINAL SURGERY     BREAST CYST ASPIRATION     CATARACT EXTRACTION W/PHACO Right 10/20/2017   Procedure: CATARACT EXTRACTION PHACO AND INTRAOCULAR LENS PLACEMENT (IOC);  Surgeon: Rosa College, MD;  Location: [facility] ORS;  Service: Ophthalmology;  Laterality: Right;  US  00:50.1 AP% 13.2 CDE 6.61 Fluid Pack lot # [id number]   COLON SURGERY     colostomy   cortisol shot     in left shoulder   DILATION AND CURETTAGE OF UTERUS     most likely after miscarriage   fibrocystic breast disease     q 6 month mammogram   gravida 6 para 2     all SAB   hyadiform mole     ILEO LOOP DIVERSION N/A 09/25/2015   Procedure: ILEO LOOP COLOSTOMY;  Surgeon: Joyce Nixon, MD;  Location: [facility] ORS;  Service: General;  Laterality: N/A;   ILEOSTOMY CLOSURE N/A 10/08/2015   Procedure: ILEOSTOMY REVISION;  Surgeon: Joyce Nixon, MD;  Location: [facility] ORS;  Service: General;  Laterality: N/A;   ILEOSTOMY CLOSURE N/A 08/20/2016   Procedure: OPEN RELOCATION OF ILEOSTOMY;  Surgeon: Joyce Nixon, MD;  Location: [facility] ORS;  Service: General;  Laterality: N/A;   IR GENERIC HISTORICAL  09/01/2016   IR US  GUIDE VASC ACCESS RIGHT 09/01/2016 Prudence Brown, PA-C [facility]   IR GENERIC HISTORICAL  09/01/2016   IR FLUORO GUIDE CV LINE RIGHT 09/01/2016 Prudence Brown, PA-C [facility]   KNEE ARTHROSCOPY W/ MENISCAL REPAIR     right knee ([place]orf)   LAPAROSCOPIC SMALL BOWEL RESECTION N/A 09/25/2015   Procedure: LAPAROSCOPIC BOWEL RESECTION TIMES TWO;  Surgeon: Joyce Nixon, MD;  Location: [facility];  Service: General;  Laterality: N/A;   ROBOTIC ASSISTED LAPAROSCOPIC LYSIS OF ADHESION N/A 09/25/2015   Procedure: XI ROBOTIC ASSISTED LAPAROSCOPIC LYSIS OF ADHESION;  Surgeon: Joyce Nixon, MD;  Location: [facility];  Service: General;  Laterality: N/A;  90 minutes   Small bowel obstruction      Prior to Admission medications   Medication Sig Start Date End  Date Taking? Authorizing Provider  ALPRAZolam  (XANAX ) 0.25 MG tablet Take 1 tablet (0.25 mg total) by mouth 2 (two) times daily as needed for anxiety. 03/06/24   Arcadio Knuckles, MD  apixaban (ELIQUIS) 5 MG TABS tablet Take 2 tablets (10 mg total) by mouth 2 (two) times daily for 7 days, THEN 1 tablet (5 mg total) 2 (two) times daily. 04/22/24 05/29/24  Pokhrel, Laxman, MD  cholecalciferol (VITAMIN D3) 25 MCG (1000 UNIT) tablet Take 1,000 Units by mouth daily.    [provider]  hydrocortisone  2.5 % cream Apply topically 2 (two) times daily. Patient taking differently: Apply 1 Application topically 2 (two) times daily as needed. 03/10/24   Arcadio Knuckles, MD  indapamide  (LOZOL ) 1.25 MG tablet Take 1 tablet (1.25 mg  total) by mouth daily. 03/13/24   Arcadio Knuckles, MD  losartan  (COZAAR ) 25 MG tablet Take 2 tablets (50 mg total) by mouth at bedtime. 04/07/24   Arcadio Knuckles, MD  Multiple Vitamin (MULTIVITAMIN WITH MINERALS) TABS tablet Take 1 tablet by mouth daily.    [provider]  potassium chloride  (KLOR-CON  10) 10 MEQ tablet Take 1 tablet (10 mEq total) by mouth 2 (two) times daily. 04/22/24   Pokhrel, Amador Bad, MD    Family History  Problem Relation Age of Onset   Diabetes Mother    Breast cancer Mother 19   Diabetes Father    Heart disease Father    HIV Father    Diabetes Sister    Breast cancer Sister 35   Diverticulosis Sister    Throat cancer Sister    Diabetes Sister    Irritable bowel syndrome Sister    Diabetes Brother    Diverticulosis Brother    Diabetes Maternal Grandmother    Hyperlipidemia Daughter    Breast cancer Maternal Aunt    Colon cancer Paternal Aunt      Social History   Tobacco Use   Smoking status: Never   Smokeless tobacco: Never  Vaping Use   Vaping status: Never Used  Substance Use Topics   Alcohol use: Yes    Alcohol/week: 2.0 standard drinks of alcohol    Types: 2 Standard drinks or equivalent per week    Comment: occ   Drug  use: No    Allergies as of 04/24/2024 - Review Complete 04/24/2024  Allergen Reaction Noted   Betadine  [povidone iodine ]  10/12/2017   Latex Rash 08/12/2016   Tape Rash 08/12/2016    Review of Systems:    All systems reviewed and negative except where noted in HPI.   Physical Exam:  BP 100/60 (BP Location: Left Arm, Patient Position: Sitting, Cuff Size: Normal)   Pulse 88   Ht 5' 3.5 (1.613 m) Comment: height measured without shoes  Wt 165 lb (74.8 kg)   BMI 28.77 kg/m  No LMP recorded. Patient has had a hysterectomy.  General:   Alert,  Well-developed, well-nourished, pleasant and cooperative in NAD Lungs:  Respirations even and unlabored.  Clear throughout to auscultation.   No wheezes, crackles, or rhonchi. No acute distress. Heart:  Regular rate and rhythm; no murmurs, clicks, rubs, or gallops. Abdomen:  Normal bowel sounds.  No bruits.  Soft, and non-distended without masses, hepatosplenomegaly or hernias noted.  Ostomy bag is in place in the right upper abdomen.  No skin rashes visible.  No Tenderness.  No guarding or rebound tenderness.    Rectal: There is erythematous [place] moist white rash extending up the gluteal cleft and around the anus consistent with candidiasis.  2 excoriations in the gluteal cleft, consistent with skin fissures.  No hemorrhoids, fistulas, or rectal masses.  Anus has been sutured closed.  Unable to do internal exam.  No visible blood at the anus. Neurologic:  Alert and oriented x3;  grossly normal neurologically. Psych:  Alert and cooperative. Normal mood and affect.  Imaging Studies: ECHOCARDIOGRAM COMPLETE Result Date: 04/22/2024    ECHOCARDIOGRAM REPORT   Patient Name:   Latasha Wood Date of Exam: 04/22/2024 Medical Rec #:  [id number]           Height:       63.5 in Accession #:    [id number]          Weight:  163.0 lb Date of Birth:  Jun 12, 1956            BSA:          1.783 m Patient Age:    68 years            BP:           101/63 mmHg  Patient Gender: F                   HR:           72 bpm. Exam Location:  Inpatient Procedure: 2D Echo, 3D Echo, Cardiac Doppler, Color Doppler and Strain Analysis            (Both Spectral and Color Flow Doppler were utilized during            procedure). Indications:    Pulmonary Embolus  History:        Patient has prior history of Echocardiogram examinations, most                 recent 08/01/2015. Pulmonary Embolus, Signs/Symptoms:Shortness of                 Breath; Risk Factors:Dyslipidemia and Diabetes.  Sonographer:    Travis Friedman RDCS Referring Phys: [phone] [facility] POKHREL IMPRESSIONS  1. Left ventricular ejection fraction, by estimation, is 65 to 70%. Left ventricular ejection fraction by 3D volume is 66 %. The left ventricle has normal function. The left ventricle has no regional wall motion abnormalities. Left ventricular diastolic  parameters are consistent with Grade I diastolic dysfunction (impaired relaxation). The average left ventricular global longitudinal strain is -19.4 %. The global longitudinal strain is normal.  2. Right ventricular systolic function is normal. The right ventricular size is normal.  3. The mitral valve is normal in structure. No evidence of mitral valve regurgitation. No evidence of mitral stenosis.  4. The aortic valve was not well visualized. Aortic valve regurgitation is not visualized.  5. The inferior vena cava is normal in size with greater than 50% respiratory variability, suggesting right atrial pressure of 3 mmHg. Comparison(s): Unable to view 2016 study for comparison. FINDINGS  Left Ventricle: Left ventricular ejection fraction, by estimation, is 65 to 70%. Left ventricular ejection fraction by 3D volume is 66 %. The left ventricle has normal function. The left ventricle has no regional wall motion abnormalities. The average left ventricular global longitudinal strain is -19.4 %. Strain was performed and the global longitudinal strain is normal. The left ventricular  internal cavity size was normal in size. There is no left ventricular hypertrophy. Left ventricular diastolic parameters are consistent with Grade I diastolic dysfunction (impaired relaxation). Right Ventricle: The right ventricular size is normal. No increase in right ventricular wall thickness. Right ventricular systolic function is normal. Left Atrium: Left atrial size was normal in size. Right Atrium: Right atrial size was normal in size. Pericardium: Trivial pericardial effusion is present. Mitral Valve: The mitral valve is normal in structure. No evidence of mitral valve regurgitation. No evidence of mitral valve stenosis. Tricuspid Valve: The tricuspid valve is normal in structure. Tricuspid valve regurgitation is trivial. No evidence of tricuspid stenosis. Aortic Valve: The aortic valve was not well visualized. Aortic valve regurgitation is not visualized. Aortic valve mean gradient measures 4.0 mmHg. Aortic valve peak gradient measures 8.8 mmHg. Aortic valve area, by VTI measures 2.21 cm. Pulmonic Valve: The pulmonic valve was normal in structure. Pulmonic valve regurgitation is not visualized. No evidence of  pulmonic stenosis. Aorta: The aortic root and ascending aorta are structurally normal, with no evidence of dilitation. Venous: The inferior vena cava is normal in size with greater than 50% respiratory variability, suggesting right atrial pressure of 3 mmHg. IAS/Shunts: The atrial septum is grossly normal. Additional Comments: 3D was performed not requiring image post processing on an independent workstation and was normal.  LEFT VENTRICLE PLAX 2D LVIDd:         3.90 cm         Diastology LVIDs:         1.60 cm         LV e' medial:    8.05 cm/s LV PW:         1.00 cm         LV E/e' medial:  8.8 LV IVS:        0.70 cm         LV e' lateral:   8.81 cm/s LVOT diam:     1.80 cm         LV E/e' lateral: 8.0 LV SV:         63 LV SV Index:   35              2D Longitudinal LVOT Area:     2.54 cm         Strain                                2D Strain GLS   -19.4 %                                Avg:                                 3D Volume EF                                LV 3D EF:    Left                                             ventricul                                             ar                                             ejection                                             fraction  by 3D                                             volume is                                             66 %.                                 3D Volume EF:                                3D EF:        66 %                                LV EDV:       87 ml                                LV ESV:       30 ml                                LV SV:        57 ml RIGHT VENTRICLE             IVC RV Basal diam:  3.40 cm     IVC diam: 1.10 cm RV S prime:     15.80 cm/s TAPSE (M-mode): 2.6 cm LEFT ATRIUM             Index        RIGHT ATRIUM           Index LA diam:        2.70 cm 1.51 cm/m   RA Area:     16.20 cm LA Vol (A2C):   32.2 ml 18.06 ml/m  RA Volume:   36.70 ml  20.58 ml/m LA Vol (A4C):   31.6 ml 17.72 ml/m LA Biplane Vol: 31.9 ml 17.89 ml/m  AORTIC VALVE AV Area (Vmax):    2.20 cm AV Area (Vmean):   2.11 cm AV Area (VTI):     2.21 cm AV Vmax:           148.00 cm/s AV Vmean:          93.500 cm/s AV VTI:            0.285 m AV Peak Grad:      8.8 mmHg AV Mean Grad:      4.0 mmHg LVOT Vmax:         128.00 cm/s LVOT Vmean:        77.700 cm/s LVOT VTI:          0.248 m LVOT/AV VTI ratio: 0.87  AORTA Ao Root diam: 2.80 cm Ao Asc diam:  2.80 cm MITRAL VALVE               TRICUSPID VALVE MV Area (PHT): 2.50 cm    TR Peak  grad:   19.4 mmHg MV Decel Time: 304 msec    TR Vmax:        220.00 cm/s MV E velocity: 70.60 cm/s MV A velocity: 94.60 cm/s  SHUNTS MV E/A ratio:  0.75        Systemic VTI:  0.25 m                            Systemic Diam: 1.80 cm Gloriann Larger MD  Electronically signed by Gloriann Larger MD Signature Date/Time: 04/22/2024/1:19:43 PM    Final    VAS US  LOWER EXTREMITY VENOUS (DVT) Result Date: 04/22/2024  Lower Venous DVT Study Patient Name:  BELVA KOZIEL  Date of Exam:   04/21/2024 Medical Rec #: [id number]            Accession #:    [id number] Date of Birth: Jun 06, 1956             Patient Gender: F Patient Age:   48 years Exam Location:  [facility] Procedure:      VAS US  LOWER EXTREMITY VENOUS (DVT) Referring Phys: Amador Bad POKHREL --------------------------------------------------------------------------------  Indications: Pulmonary embolism.  Performing Technologist: Carleene Chase RVS  Examination Guidelines: A complete evaluation includes B-mode imaging, spectral Doppler, color Doppler, and power Doppler as needed of all accessible portions of each vessel. Bilateral testing is considered an integral part of a complete examination. Limited examinations for reoccurring indications may be performed as noted. The reflux portion of the exam is performed with the patient in reverse Trendelenburg.  +---------+---------------+---------+-----------+----------+--------------+ RIGHT    CompressibilityPhasicitySpontaneityPropertiesThrombus Aging +---------+---------------+---------+-----------+----------+--------------+ CFV      Full           Yes      Yes                                 +---------+---------------+---------+-----------+----------+--------------+ SFJ      Full                                                        +---------+---------------+---------+-----------+----------+--------------+ FV Prox  Full                                                        +---------+---------------+---------+-----------+----------+--------------+ FV Mid   Full                                                        +---------+---------------+---------+-----------+----------+--------------+ FV DistalFull                                                         +---------+---------------+---------+-----------+----------+--------------+ PFV      Full                                                        +---------+---------------+---------+-----------+----------+--------------+  POP      Full           Yes      Yes                                 +---------+---------------+---------+-----------+----------+--------------+ PTV      Full                                                        +---------+---------------+---------+-----------+----------+--------------+ PERO     None                                         Acute          +---------+---------------+---------+-----------+----------+--------------+   +---------+---------------+---------+-----------+----------+--------------+ LEFT     CompressibilityPhasicitySpontaneityPropertiesThrombus Aging +---------+---------------+---------+-----------+----------+--------------+ CFV      Full           Yes      Yes                                 +---------+---------------+---------+-----------+----------+--------------+ SFJ      Full                                                        +---------+---------------+---------+-----------+----------+--------------+ FV Prox  Partial                                      Acute          +---------+---------------+---------+-----------+----------+--------------+ FV Mid   Full                                                        +---------+---------------+---------+-----------+----------+--------------+ FV DistalFull                                         Acute          +---------+---------------+---------+-----------+----------+--------------+ PFV      Partial                                                     +---------+---------------+---------+-----------+----------+--------------+ POP      Full           Yes      Yes                                  +---------+---------------+---------+-----------+----------+--------------+ PTV      Full                                                        +---------+---------------+---------+-----------+----------+--------------+  PERO     Full                                                        +---------+---------------+---------+-----------+----------+--------------+     Summary: RIGHT: - Findings consistent with acute deep vein thrombosis involving the right peroneal veins.  - No cystic structure found in the popliteal fossa.  LEFT: - Findings consistent with acute, non occlusive DVT noted in the left proximal femoral vein and profunda vein.  - No cystic structure found in the popliteal fossa.  *See table(s) above for measurements and observations. Electronically signed by Genny Kid MD on 04/22/2024 at 10:57:40 AM.    Final    CT Angio Chest PE W and/or Wo Contrast Result Date: 04/20/2024 CLINICAL DATA:  Shortness of breath EXAM: CT ANGIOGRAPHY CHEST WITH CONTRAST TECHNIQUE: Multidetector CT imaging of the chest was performed using the standard protocol during bolus administration of intravenous contrast. Multiplanar CT image reconstructions and MIPs were obtained to evaluate the vascular anatomy. RADIATION DOSE REDUCTION: This exam was performed according to the departmental dose-optimization program which includes automated exposure control, adjustment of the mA and/or kV according to patient size and/or use of iterative reconstruction technique. CONTRAST:  75mL OMNIPAQUE  IOHEXOL  350 MG/ML SOLN COMPARISON:  Chest x-ray 10/20/2022, CT chest 06/24/2011 FINDINGS: Cardiovascular: Satisfactory opacification of the pulmonary arteries to the segmental level. Positive for acute bilateral pulmonary emboli. Small volume thrombus within right middle lobe segmental vessels. Positive for thrombus in right lower lobar, segmental and subsegmental vessels. Small volume thrombus within left upper lobe segmental  and subsegmental vessels. Positive for thrombus within left lower lobar, segmental and subsegmental vessels. No evidence for right heart strain. RV LV ratio is 0.82. Nonaneurysmal aorta.  Normal cardiac size.  No pericardial effusion Mediastinum/Nodes: Patent trachea. Right thyroid  mass measuring 4 cm. Esophagus within normal limits. No suspicious lymph nodes Lungs/Pleura: No pleural effusion or pneumothorax. Dependent atelectasis Upper Abdomen: No acute finding in the upper abdomen Musculoskeletal: No acute osseous abnormality. Multilevel degenerative change Review of the MIP images confirms the above findings. IMPRESSION: 1. Positive for acute bilateral pulmonary emboli. No CT evidence for right heart strain. 2. 4 cm right thyroid  mass. Recommend thyroid  US  (ref: J Am Coll Radiol. 2015 Feb;12(2): 143-50).This should be performed on a nonemergent basis. Critical Value/emergent results were called by telephone at the time of interpretation on 04/20/2024 at 11:55 pm to provider Dr. Alayne Hubert, Who verbally acknowledged these results. Electronically Signed   By: Esmeralda Hedge M.D.   On: 04/20/2024 23:56    Labs: CBC    Component Value Date/Time   WBC 8.0 04/22/2024 0249   RBC 4.16 04/22/2024 0249   HGB 12.0 04/22/2024 0249   HGB 11.8 07/31/2015 1001   HCT 37.5 04/22/2024 0249   HCT 36.5 07/31/2015 1001   PLT 366 04/22/2024 0249   PLT 330 07/31/2015 1001   MCV 90.1 04/22/2024 0249   MCV 90 07/31/2015 1001   MCH 28.8 04/22/2024 0249   MCHC 32.0 04/22/2024 0249   RDW 12.3 04/22/2024 0249   RDW 14.5 07/31/2015 1001   LYMPHSABS 2.2 04/20/2024 1543   LYMPHSABS 3.5 (H) 07/31/2015 1001   MONOABS 0.7 04/20/2024 1543   EOSABS 0.2 04/20/2024 1543   EOSABS 0.1 07/31/2015 1001   BASOSABS 0.0 04/20/2024 1543   BASOSABS 0.0  07/31/2015 1001    CMP     Component Value Date/Time   NA 138 04/22/2024 0850   NA 138 04/19/2024 1518   K 3.6 04/22/2024 0850   CL 104 04/22/2024 0850   CO2 23 04/22/2024 0850    GLUCOSE 86 04/22/2024 0850   BUN 26 (H) 04/22/2024 0850   BUN 29 (H) 04/19/2024 1518   CREATININE 1.20 (H) 04/22/2024 0850   CALCIUM  9.1 04/22/2024 0850   PROT 6.9 04/21/2024 0458   PROT 7.9 04/19/2024 1518   ALBUMIN  3.2 (L) 04/21/2024 0458   ALBUMIN  4.1 04/19/2024 1518   AST 18 04/21/2024 0458   ALT 13 04/21/2024 0458   ALKPHOS 80 04/21/2024 0458   BILITOT 0.5 04/21/2024 0458   BILITOT 0.4 04/19/2024 1518   GFRNONAA 49 (L) 04/22/2024 0850   GFRAA 73 03/20/2020 1016    Assessment and Plan:   MIAYA LAFONTANT is a 68 y.o. y/o female has been referred for:  History of Ulcerative Colitis (Dx Age 21), Failed multiple treatments; S/P Colectomy with Ileostomy  2.  Abdominal Pain - Mild generalized discomfort. - CT Abd / Pelvis Enterography  3.  Peri-Anal /Skin Candidiasis in the Gluteal Cleft with Fissure - Rx Nystatin Cream; Apply BID until rash resolves; Disp: 60g, 1 RF. - Keep the skin clean and dry.  4.  Ostomy Irritation - Continue F/U with [facility]  3.  Pulmonary Emboli; DVTs s/p Recent Hospital Admission 04/20/2024 until 04/23/2024 Just Started on Eliquis. - I encouraged patient to keep follow-up appointment with hematology that was recommended.  Follow up based on CT and lab results.  Also follow-up based on symptoms.  Brigitte Canard, PA-C

## 2024-04-24 NOTE — Telephone Encounter (Signed)
 Copied from CRM [id number]. Topic: Appointments - Scheduling Inquiry for Clinic >> Apr 24, 2024  1:51 PM Mesmerise C wrote: Reason for CRM: Patient has appointment tomorrow and just got released from hospital Saturday and inquiring if she still needs to show up since they ran tests and would like to doctor to take a look at them to determine if she needs to show. Patient would like a callback at [phone] and be advised if she still needs to show for her appointment.

## 2024-04-24 NOTE — Patient Instructions (Addendum)
 You have been scheduled for a CT Enterography scan of the abdomen and pelvis at [facility], 1st floor Radiology. You are scheduled on Friday, 05-05-24 at 10:30 am. You should arrive at  9:30 am for registration and to drink the oral contrast.   1) Do not eat anything after 6:30 am (4 hours prior to your test)   If you have any questions regarding your exam or if you need to reschedule, you may call [facility] at [phone] between the hours of 8:00 am and 5:00 pm, Monday-Friday.  ______________________________________________________  We have sent the following medications to your pharmacy for you to pick up at your convenience: Mycostatin cream: apply 2 times a day until rash is gone.  Please follow up as needed.  Thank you for entrusting me with your care and for choosing [facility], [place] Can[place], [place]   If your blood pressure at your visit was 140/90 or greater, please contact your primary care physician to follow up on this. ______________________________________________________  If you are age 55 or older, your body mass index should be between 23-30. Your Body mass index is 28.77 kg/m. If this is out of the aforementioned range listed, please consider follow up with your Primary Care Provider.  If you are age 34 or younger, your body mass index should be between 19-25. Your Body mass index is 28.77 kg/m. If this is out of the aformentioned range listed, please consider follow up with your Primary Care Provider.  ________________________________________________________  The [facility] providers would like to encourage you to use MYCHART to communicate with providers for non-urgent requests or questions.  Due to long hold times on the telephone, sending your provider a message by [facility] may be a faster and more efficient way to get a response.  Please allow 48 business hours for a response.  Please remember that this is for non-urgent requests.   _______________________________________________________  Due to recent changes in healthcare laws, you may see the results of your imaging and laboratory studies on MyChart before your provider has had a chance to review them.  We understand that in some cases there may be results that are confusing or concerning to you. Not all laboratory results come back in the same time frame and the provider may be waiting for multiple results in order to interpret others.  Please give us  48 hours in order for your provider to thoroughly review all the results before contacting the office for clarification of your results.

## 2024-04-24 NOTE — Transitions of Care (Post Inpatient/ED Visit) (Signed)
   04/24/2024  Name: Latasha Wood MRN: [id number] DOB: 1955/11/20  Today's TOC FU Call Status: Today's TOC FU Call Status:: Successful TOC FU Call Completed TOC FU Call Complete Date: 04/22/24 Patient's Name and Date of Birth confirmed.  Transition Care Management Follow-up Telephone Call Date of Discharge: 04/22/24 Discharge Facility: [facility]) Type of Discharge: Inpatient Admission Primary Inpatient Discharge Diagnosis:: PE Any questions or concerns?: No  Items Reviewed: Did you receive and understand the discharge instructions provided?: Yes Medications obtained,verified, and reconciled?: Yes (Medications Reviewed) Any new allergies since your discharge?: No Dietary orders reviewed?: Yes Do you have support at home?: Yes People in Home [RPT]: spouse  Medications Reviewed Today: Medications Reviewed Today     Reviewed by Darrall Ellison, LPN (Licensed Practical Nurse) on 04/24/24 at 1240  Med List Status: <None>   Medication Order Taking? Sig Documenting Provider Last Dose Status Informant  ALPRAZolam  (XANAX ) 0.25 MG tablet [id number] Yes Take 1 tablet (0.25 mg total) by mouth 2 (two) times daily as needed for anxiety. Arcadio Knuckles, MD  Active Self  apixaban (ELIQUIS) 5 MG TABS tablet [id number] Yes Take 2 tablets (10 mg total) by mouth 2 (two) times daily for 7 days, THEN 1 tablet (5 mg total) 2 (two) times daily. Pokhrel, Laxman, MD  Active   cholecalciferol (VITAMIN D3) 25 MCG (1000 UNIT) tablet [id number] Yes Take 1,000 Units by mouth daily. [provider]  Active Self  hydrocortisone  2.5 % cream [id number] Yes Apply topically 2 (two) times daily.  Patient taking differently: Apply 1 Application topically 2 (two) times daily as needed.   Arcadio Knuckles, MD  Active Self  indapamide  (LOZOL ) 1.25 MG tablet [id number] Yes Take 1 tablet (1.25 mg total) by mouth daily. Arcadio Knuckles, MD  Active Self  losartan  (COZAAR ) 25 MG tablet [id number] Yes Take 2  tablets (50 mg total) by mouth at bedtime. Arcadio Knuckles, MD  Active Self  Multiple Vitamin (MULTIVITAMIN WITH MINERALS) TABS tablet [id number] Yes Take 1 tablet by mouth daily. [provider]  Active Self  nystatin cream (MYCOSTATIN) [id number] Yes Apply 1 Application topically 2 (two) times daily. Brigitte Canard, PA-C  Active   potassium chloride  (KLOR-CON  10) 10 MEQ tablet [id number] Yes Take 1 tablet (10 mEq total) by mouth 2 (two) times daily. Pokhrel, Amador Bad, MD  Active             Home Care and Equipment/Supplies: Were Home Health Services Ordered?: NA Any new equipment or medical supplies ordered?: NA  Functional Questionnaire: Do you need assistance with bathing/showering or dressing?: No Do you need assistance with meal preparation?: No Do you need assistance with eating?: No Do you have difficulty maintaining continence: No Do you need assistance with getting out of bed/getting out of a chair/moving?: No Do you have difficulty managing or taking your medications?: No  Follow up appointments reviewed: PCP Follow-up appointment confirmed?: Yes Date of PCP follow-up appointment?: 05/03/24 Follow-up Provider: [facility] Follow-up appointment confirmed?: No Reason Specialist Follow-Up Not Confirmed: [facility] Calling Clinician Notified Provider Practice of Needed Appointment Do you need transportation to your follow-up appointment?: No Do you understand care options if your condition(s) worsen?: Yes-patient verbalized understanding    SIGNATURE Darrall Ellison, LPN [facility] Nurse Health Advisor Direct Dial [phone]

## 2024-04-24 NOTE — Progress Notes (Signed)
 Agree with assessment and plan as outlined.

## 2024-04-25 ENCOUNTER — Ambulatory Visit

## 2024-04-25 ENCOUNTER — Other Ambulatory Visit: Payer: Self-pay | Admitting: Internal Medicine

## 2024-04-25 DIAGNOSIS — D6861 Antiphospholipid syndrome: Secondary | ICD-10-CM | POA: Insufficient documentation

## 2024-04-25 DIAGNOSIS — I2699 Other pulmonary embolism without acute cor pulmonale: Secondary | ICD-10-CM

## 2024-04-25 LAB — DRVVT MIX: dRVVT Mix: 46.5 s — ABNORMAL HIGH (ref 0.0–40.4)

## 2024-04-25 LAB — LUPUS ANTICOAGULANT PANEL
DRVVT: 58.1 s — ABNORMAL HIGH (ref 0.0–47.0)
PTT Lupus Anticoagulant: 54.9 s — ABNORMAL HIGH (ref 0.0–43.5)

## 2024-04-25 LAB — PTT-LA MIX: PTT-LA Mix: 40.7 s — ABNORMAL HIGH (ref 0.0–40.5)

## 2024-04-25 LAB — DRVVT CONFIRM: dRVVT Confirm: 1.3 ratio — ABNORMAL HIGH (ref 0.8–1.2)

## 2024-04-25 LAB — HEXAGONAL PHASE PHOSPHOLIPID: Hexagonal Phase Phospholipid: 12 s — ABNORMAL HIGH (ref 0–11)

## 2024-04-26 LAB — FACTOR 5 LEIDEN

## 2024-04-26 NOTE — Telephone Encounter (Signed)
HFU scheduled

## 2024-04-27 LAB — PROTHROMBIN GENE MUTATION

## 2024-04-29 LAB — PROTEIN S ACTIVITY: Protein S Activity: 66 % (ref 63–140)

## 2024-04-29 LAB — PROTEIN S, TOTAL: Protein S Ag, Total: 94 % (ref 60–150)

## 2024-04-29 LAB — PROTEIN C ACTIVITY: Protein C Activity: 127 % (ref 73–180)

## 2024-05-02 ENCOUNTER — Other Ambulatory Visit: Payer: Self-pay

## 2024-05-02 ENCOUNTER — Telehealth: Payer: Self-pay | Admitting: Physician Assistant

## 2024-05-02 ENCOUNTER — Inpatient Hospital Stay

## 2024-05-02 ENCOUNTER — Encounter (HOSPITAL_COMMUNITY): Payer: Self-pay

## 2024-05-02 ENCOUNTER — Inpatient Hospital Stay: Admitting: Physician Assistant

## 2024-05-02 ENCOUNTER — Encounter (HOSPITAL_COMMUNITY): Payer: Self-pay | Admitting: Internal Medicine

## 2024-05-02 ENCOUNTER — Inpatient Hospital Stay (HOSPITAL_COMMUNITY)
Admission: AD | Admit: 2024-05-02 | Discharge: 2024-05-05 | DRG: 377 | Disposition: A | Discharge status: Home / Self Care | Source: Ambulatory Visit | Attending: Internal Medicine | Admitting: Internal Medicine

## 2024-05-02 VITALS — BP 107/62 | HR 88 | Temp 97.3°F | Resp 15 | Wt 168.0 lb

## 2024-05-02 DIAGNOSIS — Z803 Family history of malignant neoplasm of breast: Secondary | ICD-10-CM

## 2024-05-02 DIAGNOSIS — Z91040 Latex allergy status: Secondary | ICD-10-CM

## 2024-05-02 DIAGNOSIS — D509 Iron deficiency anemia, unspecified: Secondary | ICD-10-CM

## 2024-05-02 DIAGNOSIS — Z86711 Personal history of pulmonary embolism: Secondary | ICD-10-CM

## 2024-05-02 DIAGNOSIS — D62 Acute posthemorrhagic anemia: Secondary | ICD-10-CM | POA: Diagnosis not present

## 2024-05-02 DIAGNOSIS — K922 Gastrointestinal hemorrhage, unspecified: Secondary | ICD-10-CM | POA: Diagnosis present

## 2024-05-02 DIAGNOSIS — Z888 Allergy status to other drugs, medicaments and biological substances status: Secondary | ICD-10-CM

## 2024-05-02 DIAGNOSIS — Z8719 Personal history of other diseases of the digestive system: Secondary | ICD-10-CM | POA: Diagnosis not present

## 2024-05-02 DIAGNOSIS — Z86718 Personal history of other venous thrombosis and embolism: Secondary | ICD-10-CM | POA: Diagnosis not present

## 2024-05-02 DIAGNOSIS — Z800 Family history of malignant neoplasm of digestive organs: Secondary | ICD-10-CM | POA: Diagnosis not present

## 2024-05-02 DIAGNOSIS — E1122 Type 2 diabetes mellitus with diabetic chronic kidney disease: Secondary | ICD-10-CM | POA: Diagnosis not present

## 2024-05-02 DIAGNOSIS — Z860100 Personal history of colon polyps, unspecified: Secondary | ICD-10-CM | POA: Diagnosis not present

## 2024-05-02 DIAGNOSIS — E785 Hyperlipidemia, unspecified: Secondary | ICD-10-CM | POA: Diagnosis not present

## 2024-05-02 DIAGNOSIS — I10 Essential (primary) hypertension: Secondary | ICD-10-CM | POA: Diagnosis present

## 2024-05-02 DIAGNOSIS — I82403 Acute embolism and thrombosis of unspecified deep veins of lower extremity, bilateral: Secondary | ICD-10-CM

## 2024-05-02 DIAGNOSIS — N1831 Chronic kidney disease, stage 3a: Secondary | ICD-10-CM | POA: Diagnosis not present

## 2024-05-02 DIAGNOSIS — I82412 Acute embolism and thrombosis of left femoral vein: Secondary | ICD-10-CM | POA: Diagnosis not present

## 2024-05-02 DIAGNOSIS — Z808 Family history of malignant neoplasm of other organs or systems: Secondary | ICD-10-CM | POA: Diagnosis not present

## 2024-05-02 DIAGNOSIS — I82451 Acute embolism and thrombosis of right peroneal vein: Secondary | ICD-10-CM | POA: Diagnosis not present

## 2024-05-02 DIAGNOSIS — E876 Hypokalemia: Secondary | ICD-10-CM | POA: Diagnosis present

## 2024-05-02 DIAGNOSIS — Z9049 Acquired absence of other specified parts of digestive tract: Secondary | ICD-10-CM

## 2024-05-02 DIAGNOSIS — K921 Melena: Secondary | ICD-10-CM | POA: Diagnosis not present

## 2024-05-02 DIAGNOSIS — Z8249 Family history of ischemic heart disease and other diseases of the circulatory system: Secondary | ICD-10-CM | POA: Diagnosis not present

## 2024-05-02 DIAGNOSIS — Z7901 Long term (current) use of anticoagulants: Secondary | ICD-10-CM | POA: Insufficient documentation

## 2024-05-02 DIAGNOSIS — E079 Disorder of thyroid, unspecified: Secondary | ICD-10-CM | POA: Diagnosis present

## 2024-05-02 DIAGNOSIS — K222 Esophageal obstruction: Secondary | ICD-10-CM | POA: Diagnosis not present

## 2024-05-02 DIAGNOSIS — Z932 Ileostomy status: Secondary | ICD-10-CM

## 2024-05-02 DIAGNOSIS — E118 Type 2 diabetes mellitus with unspecified complications: Secondary | ICD-10-CM | POA: Diagnosis present

## 2024-05-02 DIAGNOSIS — K51919 Ulcerative colitis, unspecified with unspecified complications: Secondary | ICD-10-CM

## 2024-05-02 DIAGNOSIS — I2699 Other pulmonary embolism without acute cor pulmonale: Secondary | ICD-10-CM | POA: Diagnosis present

## 2024-05-02 DIAGNOSIS — Z833 Family history of diabetes mellitus: Secondary | ICD-10-CM

## 2024-05-02 DIAGNOSIS — E860 Dehydration: Secondary | ICD-10-CM | POA: Diagnosis present

## 2024-05-02 DIAGNOSIS — K449 Diaphragmatic hernia without obstruction or gangrene: Secondary | ICD-10-CM | POA: Diagnosis not present

## 2024-05-02 DIAGNOSIS — F411 Generalized anxiety disorder: Secondary | ICD-10-CM | POA: Diagnosis present

## 2024-05-02 DIAGNOSIS — D649 Anemia, unspecified: Principal | ICD-10-CM

## 2024-05-02 DIAGNOSIS — Z79899 Other long term (current) drug therapy: Secondary | ICD-10-CM | POA: Insufficient documentation

## 2024-05-02 DIAGNOSIS — I129 Hypertensive chronic kidney disease with stage 1 through stage 4 chronic kidney disease, or unspecified chronic kidney disease: Secondary | ICD-10-CM | POA: Diagnosis not present

## 2024-05-02 LAB — CBC WITH DIFFERENTIAL/PLATELET
Abs Immature Granulocytes: 0.04 10*3/uL (ref 0.00–0.07)
Basophils Absolute: 0 10*3/uL (ref 0.0–0.1)
Basophils Relative: 0 %
Eosinophils Absolute: 0.2 10*3/uL (ref 0.0–0.5)
Eosinophils Relative: 2 %
HCT: 22.2 % — ABNORMAL LOW (ref 36.0–46.0)
Hemoglobin: 7 g/dL — ABNORMAL LOW (ref 12.0–15.0)
Immature Granulocytes: 0 %
Lymphocytes Relative: 29 %
Lymphs Abs: 2.9 10*3/uL (ref 0.7–4.0)
MCH: 29.2 pg (ref 26.0–34.0)
MCHC: 31.5 g/dL (ref 30.0–36.0)
MCV: 92.5 fL (ref 80.0–100.0)
Monocytes Absolute: 0.6 10*3/uL (ref 0.1–1.0)
Monocytes Relative: 6 %
Neutro Abs: 6.2 10*3/uL (ref 1.7–7.7)
Neutrophils Relative %: 63 %
Platelets: 356 10*3/uL (ref 150–400)
RBC: 2.4 MIL/uL — ABNORMAL LOW (ref 3.87–5.11)
RDW: 13.5 % (ref 11.5–15.5)
WBC: 9.9 10*3/uL (ref 4.0–10.5)
nRBC: 0.3 % — ABNORMAL HIGH (ref 0.0–0.2)

## 2024-05-02 LAB — RETIC PANEL
Immature Retic Fract: 29.3 % — ABNORMAL HIGH (ref 2.3–15.9)
RBC.: 2.39 MIL/uL — ABNORMAL LOW (ref 3.87–5.11)
Retic Count, Absolute: 110.2 10*3/uL (ref 19.0–186.0)
Retic Ct Pct: 4.6 % — ABNORMAL HIGH (ref 0.4–3.1)
Reticulocyte Hemoglobin: 34.8 pg (ref >27.9)

## 2024-05-02 LAB — COMPREHENSIVE METABOLIC PANEL WITH GFR
ALT: 15 U/L (ref 0–44)
AST: 21 U/L (ref 15–41)
Albumin: 3.2 g/dL — ABNORMAL LOW (ref 3.5–5.0)
Alkaline Phosphatase: 64 U/L (ref 38–126)
Anion gap: 10 (ref 5–15)
BUN: 24 mg/dL — ABNORMAL HIGH (ref 8–23)
CO2: 28 mmol/L (ref 22–32)
Calcium: 9 mg/dL (ref 8.9–10.3)
Chloride: 98 mmol/L (ref 98–111)
Creatinine, Ser: 1.17 mg/dL — ABNORMAL HIGH (ref 0.44–1.00)
GFR, Estimated: 51 mL/min — ABNORMAL LOW (ref >60)
Glucose, Bld: 89 mg/dL (ref 70–99)
Potassium: 2.7 mmol/L — CL (ref 3.5–5.1)
Sodium: 136 mmol/L (ref 135–145)
Total Bilirubin: 0.6 mg/dL (ref 0.0–1.2)
Total Protein: 6.5 g/dL (ref 6.5–8.1)

## 2024-05-02 LAB — APTT: aPTT: 29 s (ref 24–36)

## 2024-05-02 LAB — CBC WITH DIFFERENTIAL (CANCER CENTER ONLY)
Abs Immature Granulocytes: 0.04 10*3/uL (ref 0.00–0.07)
Basophils Absolute: 0 10*3/uL (ref 0.0–0.1)
Basophils Relative: 0 %
Eosinophils Absolute: 0.2 10*3/uL (ref 0.0–0.5)
Eosinophils Relative: 2 %
HCT: 22 % — ABNORMAL LOW (ref 36.0–46.0)
Hemoglobin: 7.5 g/dL — ABNORMAL LOW (ref 12.0–15.0)
Immature Granulocytes: 0 %
Lymphocytes Relative: 26 %
Lymphs Abs: 2.4 10*3/uL (ref 0.7–4.0)
MCH: 29.3 pg (ref 26.0–34.0)
MCHC: 34.1 g/dL (ref 30.0–36.0)
MCV: 85.9 fL (ref 80.0–100.0)
Monocytes Absolute: 0.6 10*3/uL (ref 0.1–1.0)
Monocytes Relative: 6 %
Neutro Abs: 6.2 10*3/uL (ref 1.7–7.7)
Neutrophils Relative %: 66 %
Platelet Count: 379 10*3/uL (ref 150–400)
RBC: 2.56 MIL/uL — ABNORMAL LOW (ref 3.87–5.11)
RDW: 13.4 % (ref 11.5–15.5)
WBC Count: 9.5 10*3/uL (ref 4.0–10.5)
nRBC: 0.2 % (ref 0.0–0.2)

## 2024-05-02 LAB — CMP (CANCER CENTER ONLY)
ALT: 10 U/L (ref 0–44)
AST: 15 U/L (ref 15–41)
Albumin: 3.6 g/dL (ref 3.5–5.0)
Alkaline Phosphatase: 61 U/L (ref 38–126)
Anion gap: 6 (ref 5–15)
BUN: 22 mg/dL (ref 8–23)
CO2: 30 mmol/L (ref 22–32)
Calcium: 9.2 mg/dL (ref 8.9–10.3)
Chloride: 103 mmol/L (ref 98–111)
Creatinine: 1.08 mg/dL — ABNORMAL HIGH (ref 0.44–1.00)
GFR, Estimated: 56 mL/min — ABNORMAL LOW (ref >60)
Glucose, Bld: 106 mg/dL — ABNORMAL HIGH (ref 70–99)
Potassium: 3.2 mmol/L — ABNORMAL LOW (ref 3.5–5.1)
Sodium: 139 mmol/L (ref 135–145)
Total Bilirubin: 0.4 mg/dL (ref 0.0–1.2)
Total Protein: 6.7 g/dL (ref 6.5–8.1)

## 2024-05-02 LAB — SAMPLE TO BLOOD BANK

## 2024-05-02 LAB — HEPARIN LEVEL (UNFRACTIONATED): Heparin Unfractionated: >1.1 [IU]/mL — ABNORMAL HIGH (ref 0.30–0.70)

## 2024-05-02 LAB — LACTATE DEHYDROGENASE: LDH: 157 U/L (ref 98–192)

## 2024-05-02 LAB — PROTIME-INR
INR: 1.1 (ref 0.8–1.2)
Prothrombin Time: 14.6 s (ref 11.4–15.2)

## 2024-05-02 LAB — MAGNESIUM: Magnesium: 2.2 mg/dL (ref 1.7–2.4)

## 2024-05-02 LAB — FOLATE: Folate: 18.2 ng/mL (ref >5.9)

## 2024-05-02 LAB — PHOSPHORUS: Phosphorus: 4.2 mg/dL (ref 2.5–4.6)

## 2024-05-02 LAB — IRON AND TIBC
Iron: 41 ug/dL (ref 28–170)
Saturation Ratios: 11 % (ref 10.4–31.8)
TIBC: 378 ug/dL (ref 250–450)
UIBC: 337 ug/dL

## 2024-05-02 LAB — FERRITIN: Ferritin: 30 ng/mL (ref 11–307)

## 2024-05-02 LAB — VITAMIN B12: Vitamin B-12: 397 pg/mL (ref 180–914)

## 2024-05-02 MED ORDER — ONDANSETRON HCL 4 MG/2ML IJ SOLN: 4.0000 mg | Freq: Four times a day (QID) | INTRAMUSCULAR | Status: DC | PRN

## 2024-05-02 MED ORDER — POTASSIUM CHLORIDE 20 MEQ PO PACK
40.0000 meq | PACK | Freq: Two times a day (BID) | ORAL | Status: DC
  Administered 2024-05-02 – 2024-05-05 (×5): 40 meq via ORAL
  Filled 2024-05-02 (×6): qty 2

## 2024-05-02 MED ORDER — POLYETHYLENE GLYCOL 3350 17 G PO PACK
117.0000 g | PACK | Freq: Once | ORAL | Status: AC
  Administered 2024-05-02: 117 g via ORAL
  Filled 2024-05-02: qty 7

## 2024-05-02 MED ORDER — HEPARIN (PORCINE) 25000 UT/250ML-% IV SOLN
950.0000 [IU]/h | INTRAVENOUS | Status: DC
  Administered 2024-05-02: 950 [IU]/h via INTRAVENOUS
  Filled 2024-05-02: qty 250

## 2024-05-02 MED ORDER — ACETAMINOPHEN 325 MG PO TABS: 650.0000 mg | ORAL_TABLET | Freq: Four times a day (QID) | ORAL | Status: DC | PRN

## 2024-05-02 MED ORDER — PANTOPRAZOLE SODIUM 40 MG IV SOLR
40.0000 mg | Freq: Two times a day (BID) | INTRAVENOUS | Status: DC
  Administered 2024-05-02 – 2024-05-05 (×6): 40 mg via INTRAVENOUS
  Filled 2024-05-02 (×6): qty 10

## 2024-05-02 MED ORDER — LOSARTAN POTASSIUM 50 MG PO TABS
50.0000 mg | ORAL_TABLET | Freq: Every day | ORAL | Status: DC
  Administered 2024-05-02: 50 mg via ORAL
  Filled 2024-05-02 (×2): qty 1

## 2024-05-02 MED ORDER — ALPRAZOLAM 0.25 MG PO TABS
0.2500 mg | ORAL_TABLET | Freq: Two times a day (BID) | ORAL | Status: DC | PRN
  Administered 2024-05-02 – 2024-05-04 (×2): 0.25 mg via ORAL
  Filled 2024-05-02 (×2): qty 1

## 2024-05-02 MED ORDER — ACETAMINOPHEN 650 MG RE SUPP: 650.0000 mg | Freq: Four times a day (QID) | RECTAL | Status: DC | PRN

## 2024-05-02 MED ORDER — ONDANSETRON HCL 4 MG PO TABS: 4.0000 mg | ORAL_TABLET | Freq: Four times a day (QID) | ORAL | Status: DC | PRN

## 2024-05-02 MED ORDER — APIXABAN 5 MG PO TABS: 5.0000 mg | ORAL_TABLET | Freq: Two times a day (BID) | ORAL | 0 refills | Status: AC

## 2024-05-02 MED ORDER — POTASSIUM CHLORIDE 10 MEQ/100ML IV SOLN
10.0000 meq | INTRAVENOUS | Status: AC
  Administered 2024-05-02 – 2024-05-03 (×2): 10 meq via INTRAVENOUS
  Filled 2024-05-02 (×2): qty 100

## 2024-05-02 NOTE — Care Plan (Signed)
 Plan of Care Note for accepted transfer   Patient: Latasha Wood MRN: [id number]   DOA: (Not on file)  Facility requesting transfer: Direct admission from home Requesting Provider: Dr. Federico Reason for transfer: Anemia Facility course: Patient is a 68 year old female with history of ulcerative colitis status post colectomy with ostomy, prediabetes, hypertension, hyperlipidemia noted to have been seen at the hematology/oncology clinic for evaluation of recently diagnosed bilateral PE and lower extremity DVT.  Patient noted to have had some improvement with her shortness of breath after being started on anticoagulation.  Routine labs obtained with a comprehensive metabolic profile with a potassium of 3.2 otherwise within normal limits.  CBC done with a hemoglobin of 7.5 otherwise within normal limits.  Last hemoglobin noted at 12.0 on 04/22/2024.  Patient denied any overt blood from ostomy site per hematology/oncology.  Hematology oncology requesting admission for workup for anemia.  Hematology oncology spoke with GI who in agreement and agreed to consult when patient has arrived.  Plan of care: The patient is accepted for admission to Telemetry unit, at [facility]..  Patient will need anemia workup including an anemia panel, hemolysis workup including LDH haptoglobin LFTs.  Hematology/oncology recommending discontinuation of Eliquis  to washout the system for GI evaluation and to place patient in the interim on IV heparin . Will need formal GI consultation once patient arrives.  Patient's primary gastroenterologist is Dr. Leigh with [facility].  Author: Toribio Hummer, MD 05/02/2024  Check [email or web address] for on-call coverage.  Nursing staff, Please call [facility] Admits & Consults System-Wide number on Amion as soon as patient's arrival, so appropriate admitting provider can evaluate the pt.

## 2024-05-02 NOTE — H&P (Signed)
 History and Physical    Latasha Wood FMW:[id number] DOB: Mar 21, 1956 DOA: 05/02/2024  PCP: Joshua Debby CROME, MD   Chief Complaint:  anemia  HPI: Latasha Wood is a 68 y.o. female with medical history significant of  Anxiety, Anemia, Ulcerative Colitis s/p ileostomy, HLD, HTN, SOB, DMII who presented to emergency department due to acute anemia.  Patient has baseline hemoglobin around 12 and dropped to 7.5.  Patient denied overt GI bleeding and saw hematology/oncology who recommended she be admitted to the hospital for further workup.  On evaluation of her ileostomy she had evidence of melena.  She states that her stools in her ostomy have been darker since she underwent initiation of Eliquis  for her PE.  Initial lab workup showed no evidence of iron deficiency or hemolysis.  She was admitted for further evaluation.  GI was consulted for likely EGD/ileoscopy.   Review of Systems: Review of Systems  Constitutional: Negative.   HENT: Negative.    Eyes: Negative.   Respiratory: Negative.    Cardiovascular: Negative.   Gastrointestinal:  Positive for blood in stool and melena.  Genitourinary: Negative.   Musculoskeletal: Negative.   Skin: Negative.   Neurological: Negative.   Endo/Heme/Allergies: Negative.   Psychiatric/Behavioral: Negative.       As per HPI otherwise 10 point review of systems negative.   Allergies  Allergen Reactions   Betadine  [Povidone Iodine ] Rash and Other (See Comments)    The skin is very sensitive   Latex Rash and Other (See Comments)    Redness of skin   Tape Rash and Other (See Comments)    Redness of skin OK with hypofix tape and paper tape! NO transpore tape adhesive tape!    Past Medical History:  Diagnosis Date   Anemia    Anxiety    Arthritis    Blood transfusion    Bowel obstruction (HCC)    Colitis, ulcerative (HCC)    Colon polyps    DVT (deep venous thrombosis) (HCC)    Hyperlipidemia    Hypertension    Ileostomy present  (HCC)    Kidney disease    Numbness and tingling    hands and feet bilat    Pulmonary embolism (HCC)    Shortness of breath dyspnea    talking or walking    Type 2 diabetes mellitus with complication, without long-term current use of insulin  (HCC) 09/05/2015   currently on no medications, (05/26/2016) pt denies diabetes.  States that she had one time high blood sugars d/t prednisone     Past Surgical History:  Procedure Laterality Date   ABDOMINAL HYSTERECTOMY  2000   ABDOMINAL SURGERY     BREAST CYST ASPIRATION     CATARACT EXTRACTION W/PHACO Right 10/20/2017   Procedure: CATARACT EXTRACTION PHACO AND INTRAOCULAR LENS PLACEMENT (IOC);  Surgeon: Myrna Adine Anes, MD;  Location: [facility] ORS;  Service: Ophthalmology;  Laterality: Right;  US  00:50.1 AP% 13.2 CDE 6.61 Fluid Pack lot # [id number]   COLON SURGERY     colostomy   cortisol shot     in left shoulder   DILATION AND CURETTAGE OF UTERUS     most likely after miscarriage   fibrocystic breast disease     q 6 month mammogram   gravida 6 para 2     all SAB   hyadiform mole     ILEO LOOP DIVERSION N/A 09/25/2015   Procedure: ILEO LOOP COLOSTOMY;  Surgeon: Bernarda Debby, MD;  Location: [facility] ORS;  Service: General;  Laterality: N/A;   ILEOSTOMY CLOSURE N/A 10/08/2015   Procedure: ILEOSTOMY REVISION;  Surgeon: Bernarda Ned, MD;  Location: [facility] ORS;  Service: General;  Laterality: N/A;   ILEOSTOMY CLOSURE N/A 08/20/2016   Procedure: OPEN RELOCATION OF ILEOSTOMY;  Surgeon: Bernarda Ned, MD;  Location: [facility] ORS;  Service: General;  Laterality: N/A;   IR GENERIC HISTORICAL  09/01/2016   IR US  GUIDE VASC ACCESS RIGHT 09/01/2016 Franky Rusk, PA-C [facility]   IR GENERIC HISTORICAL  09/01/2016   IR FLUORO GUIDE CV LINE RIGHT 09/01/2016 Franky Rusk, PA-C [facility]   KNEE ARTHROSCOPY W/ MENISCAL REPAIR     right knee ([place])   LAPAROSCOPIC SMALL BOWEL RESECTION N/A 09/25/2015   Procedure: LAPAROSCOPIC BOWEL RESECTION TIMES TWO;   Surgeon: Bernarda Ned, MD;  Location: [facility];  Service: General;  Laterality: N/A;   ROBOTIC ASSISTED LAPAROSCOPIC LYSIS OF ADHESION N/A 09/25/2015   Procedure: XI ROBOTIC ASSISTED LAPAROSCOPIC LYSIS OF ADHESION;  Surgeon: Bernarda Ned, MD;  Location: [facility] ORS;  Service: General;  Laterality: N/A;  90 minutes   Small bowel obstruction       reports that she has never smoked. She has never used smokeless tobacco. She reports current alcohol use of about 2.0 standard drinks of alcohol per week. She reports that she does not use drugs.  Family History  Problem Relation Age of Onset   Diabetes Mother    Breast cancer Mother 8   Diabetes Father    Heart disease Father    HIV Father    Diabetes Sister    Breast cancer Sister 51   Diverticulosis Sister    Throat cancer Sister    Diabetes Sister    Irritable bowel syndrome Sister    Diabetes Brother    Diverticulosis Brother    Diabetes Maternal Grandmother    Hyperlipidemia Daughter    Breast cancer Maternal Aunt    Colon cancer Paternal Aunt     Prior to Admission medications   Medication Sig Start Date End Date Taking? Authorizing Provider  ALPRAZolam  (XANAX ) 0.25 MG tablet Take 1 tablet (0.25 mg total) by mouth 2 (two) times daily as needed for anxiety. 03/06/24  Yes Joshua Ned CROME, MD  apixaban  (ELIQUIS ) 5 MG TABS tablet Take 1 tablet (5 mg total) by mouth 2 (two) times daily. 05/02/24  Yes Thayil, Irene T, PA-C  cholecalciferol (VITAMIN D3) 25 MCG (1000 UNIT) tablet Take 1,000 Units by mouth daily.   Yes [provider]  hydrocortisone  2.5 % cream Apply topically 2 (two) times daily. Patient taking differently: Apply 1 Application topically 2 (two) times daily as needed (for irritation). 03/10/24  Yes Joshua Ned CROME, MD  indapamide  (LOZOL ) 1.25 MG tablet Take 1 tablet (1.25 mg total) by mouth daily. 03/13/24  Yes Joshua Ned CROME, MD  losartan  (COZAAR ) 25 MG tablet Take 2 tablets (50 mg total) by mouth at bedtime. 04/07/24  Yes  Joshua Ned CROME, MD  Multiple Vitamin (MULTIVITAMIN WITH MINERALS) TABS tablet Take 1 tablet by mouth daily with breakfast.   Yes [provider]  nystatin cream (MYCOSTATIN) Apply 1 Application topically 2 (two) times daily. Patient taking differently: Apply 1 Application topically See admin instructions. Apply to affected area 2 times a day 04/24/24  Yes [place], PA-C  potassium chloride  (KLOR-CON  10) 10 MEQ tablet Take 1 tablet (10 mEq total) by mouth 2 (two) times daily. Patient not taking: Reported on 05/02/2024 04/22/24   Sonjia Held, MD    Physical Exam:  Vitals:   05/02/24 1749 05/02/24 1852  BP: 115/70   Pulse: 79   Resp: 16   Temp: 98.6 F (37 C)   SpO2: 100%   Weight:  76.2 kg  Height:  5' 3 (1.6 m)   Physical Exam Constitutional:      Appearance: She is normal weight.  HENT:     Head: Normocephalic.     Nose: Nose normal.     Mouth/Throat:     Mouth: Mucous membranes are moist.     Pharynx: Oropharynx is clear.   Eyes:     Extraocular Movements: Extraocular movements intact.     Pupils: Pupils are equal, round, and reactive to light.    Cardiovascular:     Rate and Rhythm: Normal rate and regular rhythm.     Pulses: Normal pulses.     Heart sounds: Normal heart sounds.  Pulmonary:     Effort: Pulmonary effort is normal.     Breath sounds: Normal breath sounds.  Abdominal:     General: Abdomen is flat.   Musculoskeletal:        General: Normal range of motion.     Cervical back: Normal range of motion.   Skin:    General: Skin is warm.     Capillary Refill: Capillary refill takes less than 2 seconds.   Neurological:     General: No focal deficit present.     Mental Status: She is alert.   Psychiatric:        Mood and Affect: Mood normal.       Labs on Admission: I have personally reviewed the patients's labs and imaging studies.  Assessment/Plan Principal Problem:   Anemia Active Problems:   Acute deep vein thrombosis  (DVT) of both lower extremities (HCC)   # Acute blood loss anemia: Possible GI source # Ulcerative colitis status post colectomy - Patient was initially started on Eliquis  for PE and since has noticed dark ostomy output - Melanic output in ostomy appliance Plan: Start twice daily PPI Trend hemoglobin Consult GI Start bowel prep NPO Midnight for possible bidirectional endoscopy  #PE on eliquis - hold hep gtt  #Generalized anxiety- continue xanxa  #hypokalemia- replete K   Admission status: Observation Telemetry  Certification: The appropriate patient status for this patient is OBSERVATION. Observation status is judged to be reasonable and necessary in order to provide the required intensity of service to ensure the patient's safety. The patient's presenting symptoms, physical exam findings, and initial radiographic and laboratory data in the context of their medical condition is felt to place them at decreased risk for further clinical deterioration. Furthermore, it is anticipated that the patient will be medically stable for discharge from the hospital within 2 midnights of admission.     Lamar Dess MD [facility] If 7PM-7AM, please contact night-coverage [email or web address]  05/02/2024, 10:06 PM

## 2024-05-02 NOTE — Progress Notes (Signed)
   05/02/24 2025  Provider Notification  Provider Name/Title E.Kathrene, NP  Date Provider Notified 05/02/24  Time Provider Notified 2010  Method of Notification Page (secure chat)  Notification Reason Critical Result  Test performed and critical result Potassium 2.7  Date Critical Result Received 05/02/24  Time Critical Result Received 2005  Provider response No new orders  Date of Provider Response 05/02/24  Time of Provider Response 2011   New orders placed for K replacement, see MAR.

## 2024-05-02 NOTE — Progress Notes (Unsigned)
 [facility] [phone]   [phone]  INITIAL CONSULT NOTE  Patient Care Team: Joshua Debby CROME, MD as PCP - General (Internal Medicine) Obie Princella HERO, MD (Inactive) (Gastroenterology) Gorge Ade, MD (Obstetrics and Gynecology) Rik Channel, MD (Ophthalmology) Debby Hila, MD as Consulting Physician (Colon and Rectal Surgery) Merceda Lela SAUNDERS, [facility] (Pharmacist) Enola Feliciano Hugger, MD as Consulting Physician (Ophthalmology)  Hematological/Oncological History 04/20/2024: Presented to cardiologist with progressive shortness of breath and dyspnea over the last few months. Labs showed positive D-dimer and was referred to ED for further evaluation.  04/20/2024-04/23/2024:  04/20/2024: CTA chest: Acute bilateral pulmonary emboli without evidence of right heart strain.  04/21/2024: LE Ultrasound: Acute DVT involving the right peroneal veins and left proximal femoral vein and left profunda vein.  Patient was started on heparin  drip and discharged on Eliquis  therapy Hypercoagulable workup was negative.  05/02/2024: Establish care with [facility]  CHIEF COMPLAINTS/PURPOSE OF CONSULTATION:  Bilateral Lower Extremity DVT Bilateral Pulmonary Emboli  HISTORY OF PRESENTING ILLNESS:  Latasha Wood 68 y.o. female with medical history significant for ulcerative colitis s/p colectomy, prediabetes, hypertension and hyperlipidemia presents to the hematology clinic for evaluation of recently diagnosed bilateral PE and lower extremity DVT. She is unaccompanied for this visit.   On exam today, Ms. Isham reports that her symptoms of shortness of breath have improved after starting anticoagulation. She is tolerating Eliquis  therapy without any overt signs of bleeding including hematochezia, melena, gum bleeding or hematuria. She is otherwise feeling well without any fevers, chills, sweats, chest pain, cough, headaches or dizziness. She has no other  complaints. Rest of the ROS is below.   MEDICAL HISTORY:  Past Medical History:  Diagnosis Date   Anemia    Anxiety    Arthritis    Blood transfusion    Bowel obstruction (HCC)    Colitis, ulcerative (HCC)    Colon polyps    DVT (deep venous thrombosis) (HCC)    Hyperlipidemia    Hypertension    Ileostomy present (HCC)    Kidney disease    Numbness and tingling    hands and feet bilat    Pulmonary embolism (HCC)    Shortness of breath dyspnea    talking or walking    Type 2 diabetes mellitus with complication, without long-term current use of insulin  (HCC) 09/05/2015   currently on no medications, (05/26/2016) pt denies diabetes.  States that she had one time high blood sugars d/t prednisone     SURGICAL HISTORY: Past Surgical History:  Procedure Laterality Date   ABDOMINAL HYSTERECTOMY  2000   ABDOMINAL SURGERY     BREAST CYST ASPIRATION     CATARACT EXTRACTION W/PHACO Right 10/20/2017   Procedure: CATARACT EXTRACTION PHACO AND INTRAOCULAR LENS PLACEMENT (IOC);  Surgeon: Myrna Adine Anes, MD;  Location: [facility] ORS;  Service: Ophthalmology;  Laterality: Right;  US  00:50.1 AP% 13.2 CDE 6.61 Fluid Pack lot # [id number]   COLON SURGERY     colostomy   cortisol shot     in left shoulder   DILATION AND CURETTAGE OF UTERUS     most likely after miscarriage   fibrocystic breast disease     q 6 month mammogram   gravida 6 para 2     all SAB   hyadiform mole     ILEO LOOP DIVERSION N/A 09/25/2015   Procedure: ILEO LOOP COLOSTOMY;  Surgeon: Hila Debby, MD;  Location: [facility];  Service: General;  Laterality: N/A;   ILEOSTOMY CLOSURE  N/A 10/08/2015   Procedure: ILEOSTOMY REVISION;  Surgeon: Bernarda Ned, MD;  Location: [facility];  Service: General;  Laterality: N/A;   ILEOSTOMY CLOSURE N/A 08/20/2016   Procedure: OPEN RELOCATION OF ILEOSTOMY;  Surgeon: Bernarda Ned, MD;  Location: [facility];  Service: General;  Laterality: N/A;   IR GENERIC HISTORICAL  09/01/2016   IR US  GUIDE  VASC ACCESS RIGHT 09/01/2016 Franky Rusk, PA-C [facility]   IR GENERIC HISTORICAL  09/01/2016   IR FLUORO GUIDE CV LINE RIGHT 09/01/2016 Franky Rusk, PA-C [facility]   KNEE ARTHROSCOPY W/ MENISCAL REPAIR     right knee (Daldorf)   LAPAROSCOPIC SMALL BOWEL RESECTION N/A 09/25/2015   Procedure: LAPAROSCOPIC BOWEL RESECTION TIMES TWO;  Surgeon: Bernarda Ned, MD;  Location: [facility];  Service: General;  Laterality: N/A;   ROBOTIC ASSISTED LAPAROSCOPIC LYSIS OF ADHESION N/A 09/25/2015   Procedure: XI ROBOTIC ASSISTED LAPAROSCOPIC LYSIS OF ADHESION;  Surgeon: Bernarda Ned, MD;  Location: [facility];  Service: General;  Laterality: N/A;  90 minutes   Small bowel obstruction      SOCIAL HISTORY: Social History   Socioeconomic History   Marital status: Married    Spouse name: Not on file   Number of children: 2   Years of education: 18   Highest education level: Bachelor's degree (e.g., BA, AB, BS)  Occupational History   Occupation: Dispensing optician: [employer]    Comment: [employer]  Tobacco Use   Smoking status: Never   Smokeless tobacco: Never  Vaping Use   Vaping status: Never Used  Substance and Sexual Activity   Alcohol use: Yes    Alcohol/week: 2.0 standard drinks of alcohol    Types: 2 Standard drinks or equivalent per week    Comment: occ   Drug use: No   Sexual activity: Yes    Partners: Male  Other Topics Concern   Not on file  Social History Narrative   HSG, [employer] - BA, [employer] for med tech.  Married '75. 2 dtrs - '79, '82; 1 grandchild. Marriage is in good health. No abuse issues.    Social Drivers of Corporate investment banker Strain: Low Risk  (04/21/2024)   Overall Financial Resource Strain (CARDIA)    Difficulty of Paying Living Expenses: Not very hard  Food Insecurity: No Food Insecurity (05/02/2024)   Hunger Vital Sign    Worried About Running Out of Food in the Last Year: Never true    Ran Out of Food in the Last Year: Never true   Transportation Needs: No Transportation Needs (05/02/2024)   PRAPARE - Administrator, Civil Service (Medical): No    Lack of Transportation (Non-Medical): No  Physical Activity: Insufficiently Active (04/21/2024)   Exercise Vital Sign    Days of Exercise per Week: 2 days    Minutes of Exercise per Session: 30 min  Stress: No Stress Concern Present (04/21/2024)   [employer] of Occupational Health - Occupational Stress Questionnaire    Feeling of Stress: Not at all  Social Connections: Socially Integrated (04/21/2024)   Social Connection and Isolation Panel    Frequency of Communication with Friends and Family: More than three times a week    Frequency of Social Gatherings with Friends and Family: Once a week    Attends Religious Services: More than 4 times per year    Active Member of [employer] or Organizations: Yes    Attends Banker Meetings: More than 4  times per year    Marital Status: Married  Recent Concern: Social Connections - Moderately Isolated (04/14/2024)   Social Connection and Isolation Panel    Frequency of Communication with Friends and Family: More than three times a week    Frequency of Social Gatherings with Friends and Family: More than three times a week    Attends Religious Services: Never    Database administrator or Organizations: No    Attends Banker Meetings: Never    Marital Status: Married  Catering manager Violence: Not At Risk (05/02/2024)   Humiliation, Afraid, Rape, and Kick questionnaire    Fear of Current or Ex-Partner: No    Emotionally Abused: No    Physically Abused: No    Sexually Abused: No    FAMILY HISTORY: Family History  Problem Relation Age of Onset   Diabetes Mother    Breast cancer Mother 38   Diabetes Father    Heart disease Father    HIV Father    Diabetes Sister    Breast cancer Sister 69   Diverticulosis Sister    Throat cancer Sister    Diabetes Sister    Irritable bowel syndrome  Sister    Diabetes Brother    Diverticulosis Brother    Diabetes Maternal Grandmother    Hyperlipidemia Daughter    Breast cancer Maternal Aunt    Colon cancer Paternal Aunt     ALLERGIES:  is allergic to betadine  [povidone iodine ], latex, and tape.  MEDICATIONS:  Current Outpatient Medications  Medication Sig Dispense Refill   ALPRAZolam  (XANAX ) 0.25 MG tablet Take 1 tablet (0.25 mg total) by mouth 2 (two) times daily as needed for anxiety. 180 tablet 0   apixaban  (ELIQUIS ) 5 MG TABS tablet Take 2 tablets (10 mg total) by mouth 2 (two) times daily for 7 days, THEN 1 tablet (5 mg total) 2 (two) times daily. 88 tablet 3   cholecalciferol (VITAMIN D3) 25 MCG (1000 UNIT) tablet Take 1,000 Units by mouth daily.     hydrocortisone  2.5 % cream Apply topically 2 (two) times daily. (Patient taking differently: Apply 1 Application topically 2 (two) times daily as needed.) 30 g 3   indapamide  (LOZOL ) 1.25 MG tablet Take 1 tablet (1.25 mg total) by mouth daily. 100 tablet 0   losartan  (COZAAR ) 25 MG tablet Take 2 tablets (50 mg total) by mouth at bedtime.     Multiple Vitamin (MULTIVITAMIN WITH MINERALS) TABS tablet Take 1 tablet by mouth daily.     nystatin cream (MYCOSTATIN) Apply 1 Application topically 2 (two) times daily. 60 g 1   potassium chloride  (KLOR-CON  10) 10 MEQ tablet Take 1 tablet (10 mEq total) by mouth 2 (two) times daily.     No current facility-administered medications for this visit.    REVIEW OF SYSTEMS:   Constitutional: ( - ) fevers, ( - )  chills , ( - ) night sweats Eyes: ( - ) blurriness of vision, ( - ) double vision, ( - ) watery eyes Ears, nose, mouth, throat, and face: ( - ) mucositis, ( - ) sore throat Respiratory: ( - ) cough, ( - ) dyspnea, ( - ) wheezes Cardiovascular: ( - ) palpitation, ( - ) chest discomfort, ( - ) lower extremity swelling Gastrointestinal:  ( - ) nausea, ( - ) heartburn, ( - ) change in bowel habits Skin: ( - ) abnormal skin  rashes Lymphatics: ( - ) new lymphadenopathy, ( - ) easy bruising  Neurological: ( - ) numbness, ( - ) tingling, ( - ) new weaknesses Behavioral/Psych: ( - ) mood change, ( - ) new changes  All other systems were reviewed with the patient and are negative.  PHYSICAL EXAMINATION: ECOG PERFORMANCE STATUS: 0 - Asymptomatic  Vitals:   05/02/24 0831  BP: 107/62  Pulse: 88  Resp: 15  Temp: (!) 97.3 F (36.3 C)  SpO2: 99%   Filed Weights   05/02/24 0831  Weight: 168 lb (76.2 kg)    GENERAL: well appearing female in NAD  SKIN: skin color, texture, turgor are normal, no rashes or significant lesions EYES: conjunctiva are pink and non-injected, sclera clear  LUNGS: clear to auscultation and percussion with normal breathing effort HEART: regular rate & rhythm and no murmurs and no lower extremity edema Musculoskeletal: no cyanosis of digits and no clubbing  PSYCH: alert & oriented x 3, fluent speech NEURO: no focal motor/sensory deficits  LABORATORY DATA:  I have reviewed the data as listed    Latest Ref Rng & Units 04/22/2024    2:49 AM 04/21/2024    4:58 AM 04/20/2024    3:43 PM  CBC  WBC 4.0 - 10.5 K/uL 8.0  9.1  8.9   Hemoglobin 12.0 - 15.0 g/dL 87.9  88.7  88.5   Hematocrit 36.0 - 46.0 % 37.5  34.3  34.4   Platelets 150 - 400 K/uL 366  323  355        Latest Ref Rng & Units 04/22/2024    8:50 AM 04/21/2024    4:58 AM 04/20/2024    5:37 PM  CMP  Glucose 70 - 99 mg/dL 86  868  895   BUN 8 - 23 mg/dL 26  29  32   Creatinine 0.44 - 1.00 mg/dL 8.79  8.86  8.99   Sodium 135 - 145 mmol/L 138  134  138   Potassium 3.5 - 5.1 mmol/L 3.6  2.8  3.3   Chloride 98 - 111 mmol/L 104  99  104   CO2 22 - 32 mmol/L 23  24  23    Calcium  8.9 - 10.3 mg/dL 9.1  8.8  8.5   Total Protein 6.5 - 8.1 g/dL  6.9  6.8   Total Bilirubin 0.0 - 1.2 mg/dL  0.5  0.5   Alkaline Phos 38 - 126 U/L  80  71   AST 15 - 41 U/L  18  20   ALT 0 - 44 U/L  13  13    RADIOGRAPHIC STUDIES: I have personally  reviewed the radiological images as listed and agreed with the findings in the report. ECHOCARDIOGRAM COMPLETE Result Date: 04/22/2024    ECHOCARDIOGRAM REPORT   Patient Name:   Latasha Wood Date of Exam: 04/22/2024 Medical Rec #:  [id number]           Height:       63.5 in Accession #:    [id number]          Weight:       163.0 lb Date of Birth:  1956-11-07            BSA:          1.783 m Patient Age:    68 years            BP:           101/63 mmHg Patient Gender: F  HR:           72 bpm. Exam Location:  Inpatient Procedure: 2D Echo, 3D Echo, Cardiac Doppler, Color Doppler and Strain Analysis            (Both Spectral and Color Flow Doppler were utilized during            procedure). Indications:    Pulmonary Embolus  History:        Patient has prior history of Echocardiogram examinations, most                 recent 08/01/2015. Pulmonary Embolus, Signs/Symptoms:Shortness of                 Breath; Risk Factors:Dyslipidemia and Diabetes.  Sonographer:    Logan Shove RDCS Referring Phys: [phone] [facility] POKHREL IMPRESSIONS  1. Left ventricular ejection fraction, by estimation, is 65 to 70%. Left ventricular ejection fraction by 3D volume is 66 %. The left ventricle has normal function. The left ventricle has no regional wall motion abnormalities. Left ventricular diastolic  parameters are consistent with Grade I diastolic dysfunction (impaired relaxation). The average left ventricular global longitudinal strain is -19.4 %. The global longitudinal strain is normal.  2. Right ventricular systolic function is normal. The right ventricular size is normal.  3. The mitral valve is normal in structure. No evidence of mitral valve regurgitation. No evidence of mitral stenosis.  4. The aortic valve was not well visualized. Aortic valve regurgitation is not visualized.  5. The inferior vena cava is normal in size with greater than 50% respiratory variability, suggesting right atrial pressure of 3 mmHg.  Comparison(s): Unable to view 2016 study for comparison. FINDINGS  Left Ventricle: Left ventricular ejection fraction, by estimation, is 65 to 70%. Left ventricular ejection fraction by 3D volume is 66 %. The left ventricle has normal function. The left ventricle has no regional wall motion abnormalities. The average left ventricular global longitudinal strain is -19.4 %. Strain was performed and the global longitudinal strain is normal. The left ventricular internal cavity size was normal in size. There is no left ventricular hypertrophy. Left ventricular diastolic parameters are consistent with Grade I diastolic dysfunction (impaired relaxation). Right Ventricle: The right ventricular size is normal. No increase in right ventricular wall thickness. Right ventricular systolic function is normal. Left Atrium: Left atrial size was normal in size. Right Atrium: Right atrial size was normal in size. Pericardium: Trivial pericardial effusion is present. Mitral Valve: The mitral valve is normal in structure. No evidence of mitral valve regurgitation. No evidence of mitral valve stenosis. Tricuspid Valve: The tricuspid valve is normal in structure. Tricuspid valve regurgitation is trivial. No evidence of tricuspid stenosis. Aortic Valve: The aortic valve was not well visualized. Aortic valve regurgitation is not visualized. Aortic valve mean gradient measures 4.0 mmHg. Aortic valve peak gradient measures 8.8 mmHg. Aortic valve area, by VTI measures 2.21 cm. Pulmonic Valve: The pulmonic valve was normal in structure. Pulmonic valve regurgitation is not visualized. No evidence of pulmonic stenosis. Aorta: The aortic root and ascending aorta are structurally normal, with no evidence of dilitation. Venous: The inferior vena cava is normal in size with greater than 50% respiratory variability, suggesting right atrial pressure of 3 mmHg. IAS/Shunts: The atrial septum is grossly normal. Additional Comments: 3D was performed not  requiring image post processing on an independent workstation and was normal.  LEFT VENTRICLE PLAX 2D LVIDd:         3.90 cm  Diastology LVIDs:         1.60 cm         LV e' medial:    8.05 cm/s LV PW:         1.00 cm         LV E/e' medial:  8.8 LV IVS:        0.70 cm         LV e' lateral:   8.81 cm/s LVOT diam:     1.80 cm         LV E/e' lateral: 8.0 LV SV:         63 LV SV Index:   35              2D Longitudinal LVOT Area:     2.54 cm        Strain                                2D Strain GLS   -19.4 %                                Avg:                                 3D Volume EF                                LV 3D EF:    Left                                             ventricul                                             ar                                             ejection                                             fraction                                             by 3D                                             volume is  66 %.                                 3D Volume EF:                                3D EF:        66 %                                LV EDV:       87 ml                                LV ESV:       30 ml                                LV SV:        57 ml RIGHT VENTRICLE             IVC RV Basal diam:  3.40 cm     IVC diam: 1.10 cm RV S prime:     15.80 cm/s TAPSE (M-mode): 2.6 cm LEFT ATRIUM             Index        RIGHT ATRIUM           Index LA diam:        2.70 cm 1.51 cm/m   RA Area:     16.20 cm LA Vol (A2C):   32.2 ml 18.06 ml/m  RA Volume:   36.70 ml  20.58 ml/m LA Vol (A4C):   31.6 ml 17.72 ml/m LA Biplane Vol: 31.9 ml 17.89 ml/m  AORTIC VALVE AV Area (Vmax):    2.20 cm AV Area (Vmean):   2.11 cm AV Area (VTI):     2.21 cm AV Vmax:           148.00 cm/s AV Vmean:          93.500 cm/s AV VTI:            0.285 m AV Peak Grad:      8.8 mmHg AV Mean Grad:      4.0 mmHg LVOT Vmax:         128.00 cm/s LVOT Vmean:         77.700 cm/s LVOT VTI:          0.248 m LVOT/AV VTI ratio: 0.87  AORTA Ao Root diam: 2.80 cm Ao Asc diam:  2.80 cm MITRAL VALVE               TRICUSPID VALVE MV Area (PHT): 2.50 cm    TR Peak grad:   19.4 mmHg MV Decel Time: 304 msec    TR Vmax:        220.00 cm/s MV E velocity: 70.60 cm/s MV A velocity: 94.60 cm/s  SHUNTS MV E/A ratio:  0.75        Systemic VTI:  0.25 m                            Systemic Diam: 1.80 cm Stanly Leavens MD Electronically signed by Stanly  Chandrasekhar MD Signature Date/Time: 04/22/2024/1:19:43 PM    Final    VAS US  LOWER EXTREMITY VENOUS (DVT) Result Date: 04/22/2024  Lower Venous DVT Study Patient Name:  DANNA SEWELL  Date of Exam:   04/21/2024 Medical Rec #: [id number]            Accession #:    [id number] Date of Birth: 01-27-1956             Patient Gender: F Patient Age:   16 years Exam Location:  [facility] Procedure:      VAS US  LOWER EXTREMITY VENOUS (DVT) Referring Phys: VERNAL POKHREL --------------------------------------------------------------------------------  Indications: Pulmonary embolism.  Performing Technologist: Alberta Lis RVS  Examination Guidelines: A complete evaluation includes B-mode imaging, spectral Doppler, color Doppler, and power Doppler as needed of all accessible portions of each vessel. Bilateral testing is considered an integral part of a complete examination. Limited examinations for reoccurring indications may be performed as noted. The reflux portion of the exam is performed with the patient in reverse Trendelenburg.  +---------+---------------+---------+-----------+----------+--------------+ RIGHT    CompressibilityPhasicitySpontaneityPropertiesThrombus Aging +---------+---------------+---------+-----------+----------+--------------+ CFV      Full           Yes      Yes                                 +---------+---------------+---------+-----------+----------+--------------+ SFJ      Full                                                         +---------+---------------+---------+-----------+----------+--------------+ FV Prox  Full                                                        +---------+---------------+---------+-----------+----------+--------------+ FV Mid   Full                                                        +---------+---------------+---------+-----------+----------+--------------+ FV DistalFull                                                        +---------+---------------+---------+-----------+----------+--------------+ PFV      Full                                                        +---------+---------------+---------+-----------+----------+--------------+ POP      Full           Yes      Yes                                 +---------+---------------+---------+-----------+----------+--------------+  PTV      Full                                                        +---------+---------------+---------+-----------+----------+--------------+ PERO     None                                         Acute          +---------+---------------+---------+-----------+----------+--------------+   +---------+---------------+---------+-----------+----------+--------------+ LEFT     CompressibilityPhasicitySpontaneityPropertiesThrombus Aging +---------+---------------+---------+-----------+----------+--------------+ CFV      Full           Yes      Yes                                 +---------+---------------+---------+-----------+----------+--------------+ SFJ      Full                                                        +---------+---------------+---------+-----------+----------+--------------+ FV Prox  Partial                                      Acute          +---------+---------------+---------+-----------+----------+--------------+ FV Mid   Full                                                         +---------+---------------+---------+-----------+----------+--------------+ FV DistalFull                                         Acute          +---------+---------------+---------+-----------+----------+--------------+ PFV      Partial                                                     +---------+---------------+---------+-----------+----------+--------------+ POP      Full           Yes      Yes                                 +---------+---------------+---------+-----------+----------+--------------+ PTV      Full                                                        +---------+---------------+---------+-----------+----------+--------------+ PERO  Full                                                        +---------+---------------+---------+-----------+----------+--------------+     Summary: RIGHT: - Findings consistent with acute deep vein thrombosis involving the right peroneal veins.  - No cystic structure found in the popliteal fossa.  LEFT: - Findings consistent with acute, non occlusive DVT noted in the left proximal femoral vein and profunda vein.  - No cystic structure found in the popliteal fossa.  *See table(s) above for measurements and observations. Electronically signed by Gaile New MD on 04/22/2024 at 10:57:40 AM.    Final    CT Angio Chest PE W and/or Wo Contrast Result Date: 04/20/2024 CLINICAL DATA:  Shortness of breath EXAM: CT ANGIOGRAPHY CHEST WITH CONTRAST TECHNIQUE: Multidetector CT imaging of the chest was performed using the standard protocol during bolus administration of intravenous contrast. Multiplanar CT image reconstructions and MIPs were obtained to evaluate the vascular anatomy. RADIATION DOSE REDUCTION: This exam was performed according to the departmental dose-optimization program which includes automated exposure control, adjustment of the mA and/or kV according to patient size and/or use of iterative reconstruction technique.  CONTRAST:  75mL OMNIPAQUE  IOHEXOL  350 MG/ML SOLN COMPARISON:  Chest x-ray 10/20/2022, CT chest 06/24/2011 FINDINGS: Cardiovascular: Satisfactory opacification of the pulmonary arteries to the segmental level. Positive for acute bilateral pulmonary emboli. Small volume thrombus within right middle lobe segmental vessels. Positive for thrombus in right lower lobar, segmental and subsegmental vessels. Small volume thrombus within left upper lobe segmental and subsegmental vessels. Positive for thrombus within left lower lobar, segmental and subsegmental vessels. No evidence for right heart strain. RV LV ratio is 0.82. Nonaneurysmal aorta.  Normal cardiac size.  No pericardial effusion Mediastinum/Nodes: Patent trachea. Right thyroid  mass measuring 4 cm. Esophagus within normal limits. No suspicious lymph nodes Lungs/Pleura: No pleural effusion or pneumothorax. Dependent atelectasis Upper Abdomen: No acute finding in the upper abdomen Musculoskeletal: No acute osseous abnormality. Multilevel degenerative change Review of the MIP images confirms the above findings. IMPRESSION: 1. Positive for acute bilateral pulmonary emboli. No CT evidence for right heart strain. 2. 4 cm right thyroid  mass. Recommend thyroid  US  (ref: J Am Coll Radiol. 2015 Feb;12(2): 143-50).This should be performed on a nonemergent basis. Critical Value/emergent results were called by telephone at the time of interpretation on 04/20/2024 at 11:55 pm to provider Dr. Ilah, Who verbally acknowledged these results. Electronically Signed   By: Luke Bun M.D.   On: 04/20/2024 23:56    ASSESSMENT & PLAN KYLAH MARESH is a 68 y.o. female who presents for a consultation for recent history of bilateral PE and lower extremity DVT.   A provoked venous thromboembolism (VTE) is one that has a clear inciting factor or event. Provoking factors include prolonged travel/immobility, surgery (particular abdominal or orthropedic), trauma,  and pregnancy/  estrogen containing birth control. After a detailed history and review of the records there is no clear provoking factor for this patient's VTE.  Patients with unprovoked VTEs have up to 25% recurrence after 5 years and 36% at 10 years, with 4% of these clots being fatal ([phone]). Therefore the formal recommendation for unprovoked VTE's is lifelong anticoagulation, as the cause may not be transient or reversible. We recommend 6 months or full strength anticoagulation with a re-evaluation  after that time.  The patient's will then have a choice of maintenance dose DOAC (preferred, recommended), 81mg  ASA PO daily (non-preferred), or no further anticoagulation (not recommended).   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  --will order baseline CMP and CBC to assure labs are adequate for DOAC therapy --recommend the patient continue eliquis  5mg  BID --patient denies any bleeding, bruising, or dark stools on this medication. It is well tolerated. No difficulties accessing/affording the medication --RTC in 6 months' time with strict return precautions for overt signs of bleeding. Will consider if patient is eligible for maintenance dosage at that time.   #Right thyroid  mass: --Under the care of [facility]NT team, last seen by Dr. Murry in April 2025.  --FNA of right thyroid  mass from January 2021 was consistent with benign follicular nodule  No orders of the defined types were placed in this encounter.   All questions were answered. The patient knows to call the clinic with any problems, questions or concerns.  I have spent a total of 60 minutes minutes of face-to-face and non-face-to-face time, preparing to see the patient, obtaining and/or reviewing separately obtained history, performing a medically appropriate examination, counseling and educating the  patient, ordering medications/tests/procedures, referring and communicating with other health care professionals, documenting clinical information in the electronic health record, independently interpreting results and communicating results to the patient, and care coordination.   [name] Police, PA-C Department of Hematology/Oncology [facility] at [facility] Phone: [phone]  Patient was seen with Dr. Federico  I have read the above note and personally examined the patient. I agree with the assessment and plan as noted above.  Briefly Mrs. Tessie Ordaz is a 67 year old female who presents for evaluation of bilateral pulmonary emboli.  At this time it appears the bilateral pulmonary emboli were unprovoked.  She is currently on Eliquis  therapy and tolerating it well with no overt signs of bleeding, bruising, or dark stools.  She does have a history of ulcerative colitis with ostomy in place.  She notes no bleeding from the ostomy site.  Of note she did have a right thyroid  mass noted on the scan with the reports evaluation showing a benign follicular nodule.  We will plan to see the patient back in 6 months time to reevaluate.  Given the unprovoked nature of her blood clot would recommend indefinite anticoagulation.  The patient voiced understanding of our findings and plan moving forward.  ADDENDUM: Patient was noted to have a hemoglobin of 7.5 during our lab draw.  This is a precipitous drop from her prior hemoglobin which was within normal limits 10 days ago.  As such we recommend admission to the hospital for consideration of GI workup.   Norleen IVAR Federico, MD Department of Hematology/Oncology [facility] at [facility] Phone: [phone] Pager: [phone] Email: [email or web address]

## 2024-05-02 NOTE — Progress Notes (Addendum)
 PHARMACY - ANTICOAGULATION CONSULT NOTE  Pharmacy Consult for IV heparin  Indication: VTE; PTA DOAC on hold  Allergies  Allergen Reactions   Betadine  [Povidone Iodine ] Rash and Other (See Comments)    The skin is very sensitive   Latex Rash and Other (See Comments)    Redness of skin   Tape Rash and Other (See Comments)    Redness of skin OK with hypofix tape and paper tape! NO transpore tape adhesive tape!    Patient Measurements: Height: 5' 3 (160 cm) Weight: 76.2 kg (168 lb) IBW/kg (Calculated) : 52.4 HEPARIN  DW (KG): 68.7  Vital Signs: Temp: 98.6 F (37 C) (06/24 1749) Temp Source: Temporal (06/24 0831) BP: 115/70 (06/24 1749) Pulse Rate: 79 (06/24 1749)  Labs: Recent Labs    05/02/24 0950  HGB 7.5*  HCT 22.0*  PLT 379  CREATININE 1.08*    Estimated Creatinine Clearance: 48 mL/min (A) (by C-G formula based on SCr of 1.08 mg/dL (H)).   Medical History: Past Medical History:  Diagnosis Date   Anemia    Anxiety    Arthritis    Blood transfusion    Bowel obstruction (HCC)    Colitis, ulcerative (HCC)    Colon polyps    DVT (deep venous thrombosis) (HCC)    Hyperlipidemia    Hypertension    Ileostomy present (HCC)    Kidney disease    Numbness and tingling    hands and feet bilat    Pulmonary embolism (HCC)    Shortness of breath dyspnea    talking or walking    Type 2 diabetes mellitus with complication, without long-term current use of insulin  (HCC) 09/05/2015   currently on no medications, (05/26/2016) pt denies diabetes.  States that she had one time high blood sugars d/t prednisone     Medications:  Medications Prior to Admission  Medication Sig Dispense Refill Last Dose/Taking   ALPRAZolam  (XANAX ) 0.25 MG tablet Take 1 tablet (0.25 mg total) by mouth 2 (two) times daily as needed for anxiety. 180 tablet 0 Unknown   apixaban  (ELIQUIS ) 5 MG TABS tablet Take 1 tablet (5 mg total) by mouth 2 (two) times daily. 360 tablet 0 05/02/2024    cholecalciferol (VITAMIN D3) 25 MCG (1000 UNIT) tablet Take 1,000 Units by mouth daily.   05/02/2024 Morning   hydrocortisone  2.5 % cream Apply topically 2 (two) times daily. (Patient taking differently: Apply 1 Application topically 2 (two) times daily as needed.) 30 g 3 Unknown   indapamide  (LOZOL ) 1.25 MG tablet Take 1 tablet (1.25 mg total) by mouth daily. 100 tablet 0 05/02/2024 Morning   Multiple Vitamin (MULTIVITAMIN WITH MINERALS) TABS tablet Take 1 tablet by mouth daily with breakfast.   Unknown   nystatin cream (MYCOSTATIN) Apply 1 Application topically 2 (two) times daily. (Patient taking differently: Apply 1 Application topically See admin instructions. Apply to affected area 2 times a day) 60 g 1 05/01/2024 Evening   losartan  (COZAAR ) 25 MG tablet Take 2 tablets (50 mg total) by mouth at bedtime.      potassium chloride  (KLOR-CON  10) 10 MEQ tablet Take 1 tablet (10 mEq total) by mouth 2 (two) times daily. (Patient not taking: Reported on 05/02/2024)   Not Taking   Scheduled:   losartan   50 mg Oral QHS   PRN: acetaminophen  **OR** acetaminophen , ALPRAZolam , ondansetron  **OR** ondansetron  (ZOFRAN ) IV  Assessment: 41 yoF with PMH of UC s/p colectomy/colostomy, HTN/HLD, recently diagnosed bilat PE/DVT on Eliquis  PTA, sent to ED by [facility] for  anemia. Was being seen by HemOnc for coag workup and noted to have worsening Hgb, thought possibly d/t recurrent GI bleeding related to UC or ostomy. Pharmacy to dose IV heparin  while Eliquis  on hold.   Baseline INR, aPTT: pending Prior anticoagulation: [name]; LD 6/24 @ 0730  Significant events:  Today, 05/02/2024: CBC: Hgb low (w/u pending); Plt stable WNL SCr c/w baseline of ~1.0 No bleeding or infusion issues per nursing  Goal of Therapy: Heparin  level 0.3-0.7 units/ml aPTT 66-102 seconds Monitor platelets by anticoagulation protocol: Yes  Plan: Heparin  950 units/hr IV infusion to start at 8pm tonight Check aPTT level 6-8 hrs after  start Anticipate elevated heparin  (anti-Xa) levels d/t recent Eliquis  Monitor/adjust heparin  per aPTT until heparin  levels correlate w/ aPTT Daily CBC, daily heparin  level once stable Monitor for signs of bleeding or thrombosis  Bard Jeans, PharmD, BCPS [phone] 05/02/2024, 7:01 PM

## 2024-05-02 NOTE — Telephone Encounter (Signed)
 I called and spoke to Latasha Wood to review labs from today. There was marked decline in her Hgb level from 12.0 (04/22/2024) to 7.5 today. Due to concern for GI bleeding in the setting of anticoagulation, recommendation is direct admission for further evaluation. Patient is having some dizziness but is stable otherwise.   Patient expressed understanding of the plan provided. She will wait to hear once hospital bed is available. She is aware to head to the ER if she has worsening symptoms such as shortness of breath, dizziness, etc.

## 2024-05-03 ENCOUNTER — Encounter (HOSPITAL_COMMUNITY)
Admission: AD | Disposition: A | Discharge status: Home / Self Care | Payer: Self-pay | Source: Ambulatory Visit | Attending: Internal Medicine

## 2024-05-03 ENCOUNTER — Telehealth: Payer: Self-pay | Admitting: Physician Assistant

## 2024-05-03 ENCOUNTER — Encounter (HOSPITAL_COMMUNITY): Payer: Self-pay | Admitting: Internal Medicine

## 2024-05-03 ENCOUNTER — Inpatient Hospital Stay (HOSPITAL_COMMUNITY): Admitting: Anesthesiology

## 2024-05-03 ENCOUNTER — Inpatient Hospital Stay: Admitting: Internal Medicine

## 2024-05-03 DIAGNOSIS — R188 Other ascites: Secondary | ICD-10-CM | POA: Diagnosis not present

## 2024-05-03 DIAGNOSIS — Z86711 Personal history of pulmonary embolism: Secondary | ICD-10-CM | POA: Diagnosis not present

## 2024-05-03 DIAGNOSIS — K921 Melena: Secondary | ICD-10-CM | POA: Diagnosis not present

## 2024-05-03 DIAGNOSIS — Z932 Ileostomy status: Secondary | ICD-10-CM | POA: Diagnosis not present

## 2024-05-03 DIAGNOSIS — Z833 Family history of diabetes mellitus: Secondary | ICD-10-CM | POA: Diagnosis not present

## 2024-05-03 DIAGNOSIS — I82412 Acute embolism and thrombosis of left femoral vein: Secondary | ICD-10-CM | POA: Diagnosis present

## 2024-05-03 DIAGNOSIS — I129 Hypertensive chronic kidney disease with stage 1 through stage 4 chronic kidney disease, or unspecified chronic kidney disease: Secondary | ICD-10-CM | POA: Diagnosis not present

## 2024-05-03 DIAGNOSIS — D62 Acute posthemorrhagic anemia: Secondary | ICD-10-CM | POA: Diagnosis not present

## 2024-05-03 DIAGNOSIS — I10 Essential (primary) hypertension: Secondary | ICD-10-CM | POA: Diagnosis present

## 2024-05-03 DIAGNOSIS — Z7901 Long term (current) use of anticoagulants: Secondary | ICD-10-CM | POA: Diagnosis not present

## 2024-05-03 DIAGNOSIS — I2699 Other pulmonary embolism without acute cor pulmonale: Secondary | ICD-10-CM

## 2024-05-03 DIAGNOSIS — N1831 Chronic kidney disease, stage 3a: Secondary | ICD-10-CM

## 2024-05-03 DIAGNOSIS — E876 Hypokalemia: Secondary | ICD-10-CM | POA: Diagnosis present

## 2024-05-03 DIAGNOSIS — Z8719 Personal history of other diseases of the digestive system: Secondary | ICD-10-CM | POA: Diagnosis not present

## 2024-05-03 DIAGNOSIS — Z86718 Personal history of other venous thrombosis and embolism: Secondary | ICD-10-CM | POA: Diagnosis not present

## 2024-05-03 DIAGNOSIS — N281 Cyst of kidney, acquired: Secondary | ICD-10-CM | POA: Diagnosis not present

## 2024-05-03 DIAGNOSIS — F411 Generalized anxiety disorder: Secondary | ICD-10-CM | POA: Diagnosis present

## 2024-05-03 DIAGNOSIS — Z8249 Family history of ischemic heart disease and other diseases of the circulatory system: Secondary | ICD-10-CM | POA: Diagnosis not present

## 2024-05-03 DIAGNOSIS — D649 Anemia, unspecified: Secondary | ICD-10-CM | POA: Diagnosis present

## 2024-05-03 DIAGNOSIS — Z803 Family history of malignant neoplasm of breast: Secondary | ICD-10-CM | POA: Diagnosis not present

## 2024-05-03 DIAGNOSIS — Z860100 Personal history of colon polyps, unspecified: Secondary | ICD-10-CM | POA: Diagnosis not present

## 2024-05-03 DIAGNOSIS — E785 Hyperlipidemia, unspecified: Secondary | ICD-10-CM | POA: Diagnosis present

## 2024-05-03 DIAGNOSIS — I82451 Acute embolism and thrombosis of right peroneal vein: Secondary | ICD-10-CM | POA: Diagnosis present

## 2024-05-03 DIAGNOSIS — Z800 Family history of malignant neoplasm of digestive organs: Secondary | ICD-10-CM | POA: Diagnosis not present

## 2024-05-03 DIAGNOSIS — Z79899 Other long term (current) drug therapy: Secondary | ICD-10-CM | POA: Diagnosis not present

## 2024-05-03 DIAGNOSIS — K449 Diaphragmatic hernia without obstruction or gangrene: Secondary | ICD-10-CM

## 2024-05-03 DIAGNOSIS — E1122 Type 2 diabetes mellitus with diabetic chronic kidney disease: Secondary | ICD-10-CM

## 2024-05-03 DIAGNOSIS — E860 Dehydration: Secondary | ICD-10-CM | POA: Diagnosis present

## 2024-05-03 DIAGNOSIS — D509 Iron deficiency anemia, unspecified: Secondary | ICD-10-CM | POA: Diagnosis not present

## 2024-05-03 DIAGNOSIS — K222 Esophageal obstruction: Secondary | ICD-10-CM | POA: Diagnosis not present

## 2024-05-03 DIAGNOSIS — K30 Functional dyspepsia: Secondary | ICD-10-CM | POA: Diagnosis not present

## 2024-05-03 DIAGNOSIS — K922 Gastrointestinal hemorrhage, unspecified: Secondary | ICD-10-CM | POA: Diagnosis present

## 2024-05-03 DIAGNOSIS — E118 Type 2 diabetes mellitus with unspecified complications: Secondary | ICD-10-CM | POA: Diagnosis present

## 2024-05-03 DIAGNOSIS — Z808 Family history of malignant neoplasm of other organs or systems: Secondary | ICD-10-CM | POA: Diagnosis not present

## 2024-05-03 HISTORY — PX: ESOPHAGOGASTRODUODENOSCOPY: SHX5428

## 2024-05-03 LAB — PREPARE RBC (CROSSMATCH)

## 2024-05-03 LAB — CBC
HCT: 21.2 % — ABNORMAL LOW (ref 36.0–46.0)
HCT: 25.4 % — ABNORMAL LOW (ref 36.0–46.0)
HCT: 28.2 % — ABNORMAL LOW (ref 36.0–46.0)
Hemoglobin: 6.8 g/dL — CL (ref 12.0–15.0)
Hemoglobin: 8.3 g/dL — ABNORMAL LOW (ref 12.0–15.0)
Hemoglobin: 9.2 g/dL — ABNORMAL LOW (ref 12.0–15.0)
MCH: 29.7 pg (ref 26.0–34.0)
MCH: 29.1 pg (ref 26.0–34.0)
MCH: 29 pg (ref 26.0–34.0)
MCHC: 32.1 g/dL (ref 30.0–36.0)
MCHC: 32.7 g/dL (ref 30.0–36.0)
MCHC: 32.6 g/dL (ref 30.0–36.0)
MCV: 92.6 fL (ref 80.0–100.0)
MCV: 89.1 fL (ref 80.0–100.0)
MCV: 89 fL (ref 80.0–100.0)
Platelets: 333 10*3/uL (ref 150–400)
Platelets: 327 10*3/uL (ref 150–400)
Platelets: 349 10*3/uL (ref 150–400)
RBC: 2.29 MIL/uL — ABNORMAL LOW (ref 3.87–5.11)
RBC: 2.85 MIL/uL — ABNORMAL LOW (ref 3.87–5.11)
RBC: 3.17 MIL/uL — ABNORMAL LOW (ref 3.87–5.11)
RDW: 13.7 % (ref 11.5–15.5)
RDW: 14 % (ref 11.5–15.5)
RDW: 14.3 % (ref 11.5–15.5)
WBC: 7.9 10*3/uL (ref 4.0–10.5)
WBC: 7 10*3/uL (ref 4.0–10.5)
WBC: 9.5 10*3/uL (ref 4.0–10.5)
nRBC: 0.4 % — ABNORMAL HIGH (ref 0.0–0.2)
nRBC: 0.3 % — ABNORMAL HIGH (ref 0.0–0.2)
nRBC: 0.2 % (ref 0.0–0.2)

## 2024-05-03 LAB — BASIC METABOLIC PANEL WITH GFR
Anion gap: 10 (ref 5–15)
BUN: 20 mg/dL (ref 8–23)
CO2: 27 mmol/L (ref 22–32)
Calcium: 9.1 mg/dL (ref 8.9–10.3)
Chloride: 99 mmol/L (ref 98–111)
Creatinine, Ser: 0.93 mg/dL (ref 0.44–1.00)
GFR, Estimated: >60 mL/min (ref >60)
Glucose, Bld: 104 mg/dL — ABNORMAL HIGH (ref 70–99)
Potassium: 3.5 mmol/L (ref 3.5–5.1)
Sodium: 136 mmol/L (ref 135–145)

## 2024-05-03 SURGERY — EGD (ESOPHAGOGASTRODUODENOSCOPY)
Anesthesia: Monitor Anesthesia Care

## 2024-05-03 MED ORDER — ONDANSETRON HCL 4 MG/2ML IJ SOLN
INTRAMUSCULAR | Status: DC | PRN
  Administered 2024-05-03: 4 mg via INTRAVENOUS

## 2024-05-03 MED ORDER — HEPARIN (PORCINE) 25000 UT/250ML-% IV SOLN: 950.0000 [IU]/h | INTRAVENOUS | Status: DC

## 2024-05-03 MED ORDER — DEXAMETHASONE SODIUM PHOSPHATE 10 MG/ML IJ SOLN
INTRAMUSCULAR | Status: DC | PRN
  Administered 2024-05-03: 8 mg via INTRAVENOUS

## 2024-05-03 MED ORDER — LIDOCAINE 2% (20 MG/ML) 5 ML SYRINGE
INTRAMUSCULAR | Status: DC | PRN
  Administered 2024-05-03: 70 mg via INTRAVENOUS

## 2024-05-03 MED ORDER — SODIUM CHLORIDE 0.9% IV SOLUTION: Freq: Once | INTRAVENOUS | Status: AC

## 2024-05-03 MED ORDER — PHENYLEPHRINE 80 MCG/ML (10ML) SYRINGE FOR IV PUSH (FOR BLOOD PRESSURE SUPPORT)
PREFILLED_SYRINGE | INTRAVENOUS | Status: DC | PRN
  Administered 2024-05-03: 80 ug via INTRAVENOUS
  Administered 2024-05-03 (×2): 160 ug via INTRAVENOUS

## 2024-05-03 MED ORDER — SODIUM CHLORIDE 0.9 % IV SOLN: INTRAVENOUS | Status: DC | PRN

## 2024-05-03 MED ORDER — PROPOFOL 500 MG/50ML IV EMUL
INTRAVENOUS | Status: AC
  Filled 2024-05-03: qty 50

## 2024-05-03 MED ORDER — PROPOFOL 500 MG/50ML IV EMUL
INTRAVENOUS | Status: DC | PRN
  Administered 2024-05-03: 30 mg via INTRAVENOUS
  Administered 2024-05-03: 130 ug/kg/min via INTRAVENOUS
  Administered 2024-05-03: 20 mg via INTRAVENOUS
  Administered 2024-05-03: 50 mg via INTRAVENOUS
  Administered 2024-05-03: 100 mg via INTRAVENOUS

## 2024-05-03 MED ORDER — HEPARIN (PORCINE) 25000 UT/250ML-% IV SOLN
950.0000 [IU]/h | INTRAVENOUS | Status: DC
  Administered 2024-05-03: 950 [IU]/h via INTRAVENOUS
  Filled 2024-05-03: qty 250

## 2024-05-03 NOTE — Plan of Care (Signed)

## 2024-05-03 NOTE — Anesthesia Procedure Notes (Signed)
 Procedure Name: MAC Date/Time: 05/03/2024 4:30 PM  Performed by: Landy Chip HERO, CRNAPre-anesthesia Checklist: Patient identified, Emergency Drugs available, Suction available, Patient being monitored and Timeout performed Patient Re-evaluated:Patient Re-evaluated prior to induction Oxygen Delivery Method: Simple face mask Preoxygenation: Pre-oxygenation with 100% oxygen Induction Type: IV induction Placement Confirmation: positive ETCO2 Dental Injury: Teeth and Oropharynx as per pre-operative assessment

## 2024-05-03 NOTE — Plan of Care (Signed)
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
## 2024-05-03 NOTE — Progress Notes (Signed)
   05/03/24 0630  Provider Notification  Provider Name/Title E.Kathrene, NP  Date Provider Notified 05/03/24  Time Provider Notified [phone]  Method of Notification Page (secure chat)  Notification Reason Critical Result  Test performed and critical result Hemoglobin 6.8  Date Critical Result Received 05/03/24  Time Critical Result Received 0626  Provider response Other (Comment) (awaiting orders)  Date of Provider Response 05/03/24  Time of Provider Response [phone]

## 2024-05-03 NOTE — Op Note (Addendum)
 [facility] Patient Name: Latasha Wood Procedure Date: 05/03/2024 MRN: [id number] Attending MD: Lynnie Bring , MD, [id number] Date of Birth: 09-13-1956 CSN: [id number] Age: 68 Admit Type: Inpatient Procedure:                Upper GI endoscopy Indications:              Melena and ileostomy with decreasing hemoglobin                            from 12 to 6.8 s/p 1U to 8.3. Providers:                Lynnie Bring, MD, Olam Riedel, RN, Farris Southgate,                            Technician Referring MD:             Norleen IVAR [facility] Medicines:                Monitored Anesthesia Care Complications:            No immediate complications. Estimated Blood Loss:     Estimated blood loss: none. Procedure:                Pre-Anesthesia Assessment:                           - Prior to the procedure, a History and Physical                            was performed, and patient medications and                            allergies were reviewed. The patient's tolerance of                            previous anesthesia was also reviewed. The risks                            and benefits of the procedure and the sedation                            options and risks were discussed with the patient.                            All questions were answered, and informed consent                            was obtained. Prior Anticoagulants: The patient has                            taken Eliquis  (apixaban ), last dose was 1 day prior                            to procedure. ASA Grade Assessment: III - A patient  with severe systemic disease. After reviewing the                            risks and benefits, the patient was deemed in                            satisfactory condition to undergo the procedure.                           After obtaining informed consent, the endoscope was                            passed under direct vision. Throughout the                             procedure, the patient's blood pressure, pulse, and                            oxygen saturations were monitored continuously. The                            [id number] ([id number]) Olympus endoscope was introduced                            through the mouth, and advanced to the second part                            of duodenum. The upper GI endoscopy was                            accomplished without difficulty. The patient                            tolerated the procedure well. Scope In: Scope Out: Findings:      The examined esophagus was normal.      A non-obstructing and mild Schatzki ring was found at the       gastroesophageal junction, 38 cm from the incisors.      A small hiatal hernia was present.      The entire examined stomach was normal. A well-healed scar was noted in       the antrum with mild surrounding erythema. No active bleeding. No       biopsies were obtained to avoid complicating clinical picture      The examined duodenum was normal. Prominent Brunner's glands in duodenal       bulb were noted. Impression:               - Small hiatal hernia.                           - Mild Schatzki ring.                           - Small hiatal hernia.                           -  No UGI bleed                           - No specimens collected. Moderate Sedation:      Not Applicable - Patient had care per Anesthesia. Recommendation:           - Return patient to hospital ward for ongoing care.                           - CTA                           - Continue Protonix  40 mg p.o. daily for now.                           - Clear liquid diet.                           - Trend CBC                           - Can restart heparin  if needed.                           - Consider IVC filter                           - Follow HP stool antigen results                           - The findings and recommendations were discussed                            with the patient's  family. Procedure Code(s):        --- Professional ---                           [phone], Esophagogastroduodenoscopy, flexible,                            transoral; diagnostic, including collection of                            specimen(s) by brushing or washing, when performed                            (separate procedure) Diagnosis Code(s):        --- Professional ---                           K22.2, Esophageal obstruction                           K44.9, Diaphragmatic hernia without obstruction or                            gangrene  K92.1, Melena (includes Hematochezia) CPT copyright 2022 American Medical Association. All rights reserved. The codes documented in this report are preliminary and upon coder review may  be revised to meet current compliance requirements. Lynnie Bring, MD 05/03/2024 4:54:53 PM This report has been signed electronically. Number of Addenda: 0

## 2024-05-03 NOTE — Progress Notes (Signed)
 Called pharmacy and spoke with [facility] and verified aPTT lab order. Okay to start heparin  gtt without aPTT being collected. Notified lab of stat aPTT lab draw order. Lab tech verbalized understanding. See MAR.

## 2024-05-03 NOTE — Telephone Encounter (Signed)
 Scheduled appointments per 6/24 los. Talked with the patients spouse and he is aware of the made appointments for the patient.

## 2024-05-03 NOTE — Transfer of Care (Signed)
 Immediate Anesthesia Transfer of Care Note  Patient: Latasha Wood  Procedure(s) Performed: EGD (ESOPHAGOGASTRODUODENOSCOPY)  Patient Location: PACU  Anesthesia Type:MAC  Level of Consciousness: awake, alert , and oriented  Airway & Oxygen Therapy: Patient Spontanous Breathing and Patient connected to face mask oxygen  Post-op Assessment: Report given to RN and Post -op Vital signs reviewed and stable  Post vital signs: Reviewed and stable  Last Vitals:  Vitals Value Taken Time  BP 89/46 05/03/24 17:10  Temp    Pulse 69 05/03/24 17:10  Resp 13 05/03/24 17:10  SpO2 98 % 05/03/24 17:10  Vitals shown include unfiled device data.  Last Pain:  Vitals:   05/03/24 1707  TempSrc:   PainSc: 0-No pain         Complications: No notable events documented.

## 2024-05-03 NOTE — Progress Notes (Addendum)
 PHARMACY - ANTICOAGULATION CONSULT NOTE  Pharmacy Consult for IV heparin  Indication: VTE; PTA DOAC on hold  Allergies  Allergen Reactions   Betadine  [Povidone Iodine ] Rash and Other (See Comments)    The skin is very sensitive   Latex Rash and Other (See Comments)    Redness of skin   Tape Rash and Other (See Comments)    Redness of skin OK with hypofix tape and paper tape! NO transpore tape adhesive tape!    Patient Measurements: Height: 5' 3 (160 cm) Weight: 76.2 kg (168 lb) IBW/kg (Calculated) : 52.4 HEPARIN  DW (KG): 68.7  Vital Signs: Temp: 97.7 F (36.5 C) (06/25 1707) Temp Source: Temporal (06/25 1707) BP: 103/62 (06/25 1720) Pulse Rate: 65 (06/25 1720)  Labs: Recent Labs    05/02/24 0950 05/02/24 0950 05/02/24 1931 05/03/24 0045 05/03/24 0543 05/03/24 1419  HGB 7.5*   < > 7.0*  --  6.8* 8.3*  HCT 22.0*  --  22.2*  --  21.2* 25.4*  PLT 379  --  356  --  333 327  APTT  --   --  29  --   --   --   LABPROT  --   --  14.6  --   --   --   INR  --   --  1.1  --   --   --   HEPARINUNFRC  --   --  >1.10*  --   --   --   CREATININE 1.08*  --  1.17* 0.93  --   --    < > = values in this interval not displayed.    Estimated Creatinine Clearance: 55.8 mL/min (by C-G formula based on SCr of 0.93 mg/dL).   Medical History: Past Medical History:  Diagnosis Date   Anemia    Anxiety    Arthritis    Blood transfusion    Bowel obstruction (HCC)    Colitis, ulcerative (HCC)    Colon polyps    DVT (deep venous thrombosis) (HCC)    Hyperlipidemia    Hypertension    Ileostomy present (HCC)    Kidney disease    Numbness and tingling    hands and feet bilat    Pulmonary embolism (HCC)    Shortness of breath dyspnea    talking or walking    Type 2 diabetes mellitus with complication, without long-term current use of insulin  (HCC) 09/05/2015   currently on no medications, (05/26/2016) pt denies diabetes.  States that she had one time high blood sugars d/t  prednisone     Medications:  Medications Prior to Admission  Medication Sig Dispense Refill Last Dose/Taking   ALPRAZolam  (XANAX ) 0.25 MG tablet Take 1 tablet (0.25 mg total) by mouth 2 (two) times daily as needed for anxiety. 180 tablet 0 Unknown   apixaban  (ELIQUIS ) 5 MG TABS tablet Take 1 tablet (5 mg total) by mouth 2 (two) times daily. 360 tablet 0 05/02/2024 at  7:30 AM   cholecalciferol (VITAMIN D3) 25 MCG (1000 UNIT) tablet Take 1,000 Units by mouth daily.   05/02/2024 Morning   hydrocortisone  2.5 % cream Apply topically 2 (two) times daily. (Patient taking differently: Apply 1 Application topically 2 (two) times daily as needed (for irritation).) 30 g 3 Unknown   indapamide  (LOZOL ) 1.25 MG tablet Take 1 tablet (1.25 mg total) by mouth daily. 100 tablet 0 05/02/2024 Morning   losartan  (COZAAR ) 25 MG tablet Take 2 tablets (50 mg total) by mouth at bedtime.  05/01/2024 Bedtime   Multiple Vitamin (MULTIVITAMIN WITH MINERALS) TABS tablet Take 1 tablet by mouth daily with breakfast.   Unknown   nystatin cream (MYCOSTATIN) Apply 1 Application topically 2 (two) times daily. (Patient taking differently: Apply 1 Application topically See admin instructions. Apply to affected area 2 times a day) 60 g 1 05/01/2024 Evening   potassium chloride  (KLOR-CON  10) 10 MEQ tablet Take 1 tablet (10 mEq total) by mouth 2 (two) times daily. (Patient not taking: Reported on 05/02/2024)   Not Taking   Scheduled:   losartan   50 mg Oral QHS   pantoprazole  (PROTONIX ) IV  40 mg Intravenous Q12H   potassium chloride   40 mEq Oral BID   PRN: acetaminophen  **OR** acetaminophen , ALPRAZolam , ondansetron  **OR** ondansetron  (ZOFRAN ) IV  Assessment: [name] with PMH of UC s/p colectomy/colostomy, HTN/HLD, recently diagnosed bilat PE/DVT on Eliquis  PTA, sent to ED by [facility] for anemia. Was being seen by HemOnc for coag workup and noted to have worsening Hgb, thought possibly d/t recurrent GI bleeding related to UC or ostomy.  Pharmacy to dose IV heparin  while Eliquis  on hold.   Prior anticoagulation: [name]; LD 6/24 @ 0730  Today, 05/03/2024: CBC: Hgb up to 8.3; Plt stable WNL SCr c/w baseline of ~1.0  Significant events: 6/25 EGD No biopsy obtain No UGI bleed Ok with GI to start IV heparin  with no delay following procedure  Goal of Therapy: Heparin  level 0.3-0.7 units/ml aPTT 66-102 seconds Monitor platelets by anticoagulation protocol: Yes  Plan: Restart Heparin  950 units/hr IV infusion Check aPTT level 6-8 hrs after start Anticipate elevated heparin  (anti-Xa) levels d/t recent Eliquis  Monitor/adjust heparin  per aPTT until heparin  levels correlate w/ aPTT Daily CBC, daily heparin  level once stable Monitor for signs of bleeding or thrombosis    Thank you for allowing pharmacy to be a part of this patient's care.  Eleanor EMERSON Agent, PharmD, BCPS Clinical Pharmacist [facility] 05/03/2024 5:34 PM

## 2024-05-03 NOTE — Progress Notes (Addendum)
 PROGRESS NOTE    Latasha Wood  FMW:[id number] DOB: 1956/09/29 DOA: 05/02/2024 PCP: Joshua Debby CROME, MD   Brief Narrative:  Latasha Wood is a 68 y.o. female with medical history significant of  Anxiety, Anemia, Ulcerative Colitis s/p ileostomy, HLD, HTN, SOB, DMII who presented to emergency department due to acute anemia without overt GI bleeding.  Admission was recommended by hematology/oncology.  Hospitalist called for admission.   Assessment & Plan:   Principal Problem:   Anemia Active Problems:   Acute deep vein thrombosis (DVT) of both lower extremities (HCC)   Acute anemia, rule out GI bleed -Hemoglobin 6.8, 1 unit PRBC transfusion pending - Concern for GI source given dark ostomy output since initiating Eliquis  for recently diagnosed PE - GI following, appreciate insight recommendations, upper endoscopy pending later today - Continue PPI  - N.p.o. for procedure -resume diet once cleared by GI  Hypokalemia - Likely secondary to GI losses given above ostomy and increased output, replete as appropriate   Subacute PE, stable  - Diagnosed at recent hospitalization, Eliquis  on hold  - Heparin  drip on hold as well given above pending endoscopy  Thyroid  mass, unspecified, right - Right sided - noted previously nontoxic single nodule - follow up outpatient as scheduled - Unclear if this will need new imaging/uptake study   Generalized anxiety- continue xanxa  DVT prophylaxis: SCDs Start: 05/02/24 1811 Code Status:   Code Status: Full Code Family Communication: Husband and daughter at bedside  Status is: Inpatient  Dispo: The patient is from: Home              Anticipated d/c is to: Home              Anticipated d/c date is: 24 to 48 hours              Patient currently not medically stable for discharge  Consultants:  GI  Procedures:  Upper endoscopy  Antimicrobials:  Peri-procedure  Subjective: No acute issues or events overnight denies nausea  vomiting headache fevers chills chest pain or shortness of breath  Objective: Vitals:   05/02/24 1852 05/02/24 2207 05/03/24 0148 05/03/24 0524  BP:  106/68 104/64 102/63  Pulse:  65 68 73  Resp:  18 18 18   Temp:  98.1 F (36.7 C) 97.8 F (36.6 C) 97.8 F (36.6 C)  SpO2:  100% 98% 99%  Weight: 76.2 kg     Height: 5' 3 (1.6 m)       Intake/Output Summary (Last 24 hours) at 05/03/2024 0741 Last data filed at 05/02/2024 2330 Gross per 24 hour  Intake --  Output 550 ml  Net -550 ml   Filed Weights   05/02/24 1852  Weight: 76.2 kg    Examination:  General:  Pleasantly resting in bed, No acute distress. HEENT:  Normocephalic atraumatic.  Sclerae nonicteric, noninjected.  Extraocular movements intact bilaterally. Neck:  Without mass or deformity.  Trachea is midline. Lungs:  Clear to auscultate bilaterally without rhonchi, wheeze, or rales. Heart:  Regular rate and rhythm.  Without murmurs, rubs, or gallops. Abdomen:  Soft, nontender, ostomy noted, empty. Extremities: Without cyanosis, clubbing, edema, or obvious deformity. Skin:  Warm and dry, no erythema.   Data Reviewed: I have personally reviewed following labs and imaging studies  CBC: Recent Labs  Lab 05/02/24 0950 05/02/24 1931 05/03/24 0543  WBC 9.5 9.9 7.9  NEUTROABS 6.2 6.2  --   HGB 7.5* 7.0* 6.8*  HCT 22.0* 22.2* 21.2*  MCV 85.9 92.5 92.6  PLT 379 356 333   Basic Metabolic Panel: Recent Labs  Lab 05/02/24 0950 05/02/24 1931 05/03/24 0045  NA 139 136 136  K 3.2* 2.7* 3.5  CL 103 98 99  CO2 30 28 27   GLUCOSE 106* 89 104*  BUN 22 24* 20  CREATININE 1.08* 1.17* 0.93  CALCIUM  9.2 9.0 9.1  MG  --  2.2  --   PHOS  --  4.2  --    GFR: Estimated Creatinine Clearance: 55.8 mL/min (by C-G formula based on SCr of 0.93 mg/dL). Liver Function Tests: Recent Labs  Lab 05/02/24 0950 05/02/24 1931  AST 15 21  ALT 10 15  ALKPHOS 61 64  BILITOT 0.4 0.6  PROT 6.7 6.5  ALBUMIN  3.6 3.2*   No results  for input(s): LIPASE, AMYLASE in the last 168 hours. No results for input(s): AMMONIA in the last 168 hours. Coagulation Profile: Recent Labs  Lab 05/02/24 1931  INR 1.1   Anemia Panel: Recent Labs    05/02/24 1931  VITAMINB12 397  FOLATE 18.2  FERRITIN 30  TIBC 378  IRON 41  RETICCTPCT 4.6*   Sepsis Labs: No results for input(s): PROCALCITON, LATICACIDVEN in the last 168 hours.  No results found for this or any previous visit (from the past 240 hours).   Radiology Studies: No results found.  Scheduled Meds:  sodium chloride    Intravenous Once   losartan   50 mg Oral QHS   pantoprazole  (PROTONIX ) IV  40 mg Intravenous Q12H   potassium chloride   40 mEq Oral BID   Continuous Infusions:   LOS: 1 day   Time spent:  Elsie JAYSON Montclair, DO [facility]  If 7PM-7AM, please contact night-coverage [email or web address]  05/03/2024, 7:41 AM

## 2024-05-03 NOTE — Anesthesia Preprocedure Evaluation (Addendum)
 Anesthesia Evaluation  Patient identified by MRN, date of birth, ID band Patient awake    Reviewed: Allergy & Precautions, NPO status , Patient's Chart, lab work & pertinent test results  Airway Mallampati: II  TM Distance: >3 FB Neck ROM: Full    Dental  (+) Dental Advisory Given, Chipped,    Pulmonary shortness of breath, PE   Pulmonary exam normal breath sounds clear to auscultation       Cardiovascular hypertension, Pt. on medications + DVT  Normal cardiovascular exam Rhythm:Regular Rate:Normal  TTE 2025 1. Left ventricular ejection fraction, by estimation, is 65 to 70%. Left  ventricular ejection fraction by 3D volume is 66 %. The left ventricle has  normal function. The left ventricle has no regional wall motion  abnormalities. Left ventricular diastolic   parameters are consistent with Grade I diastolic dysfunction (impaired  relaxation). The average left ventricular global longitudinal strain is  -19.4 %. The global longitudinal strain is normal.   2. Right ventricular systolic function is normal. The right ventricular  size is normal.   3. The mitral valve is normal in structure. No evidence of mitral valve  regurgitation. No evidence of mitral stenosis.   4. The aortic valve was not well visualized. Aortic valve regurgitation  is not visualized.   5. The inferior vena cava is normal in size with greater than 50%  respiratory variability, suggesting right atrial pressure of 3 mmHg.     Neuro/Psych  PSYCHIATRIC DISORDERS Anxiety     negative neurological ROS     GI/Hepatic Neg liver ROS, PUD,GERD  ,,UC   Endo/Other  diabetes, Type 2    Renal/GU Renal InsufficiencyRenal disease  negative genitourinary   Musculoskeletal  (+) Arthritis ,    Abdominal   Peds  Hematology  (+) Blood dyscrasia (eliquis ), anemia   Anesthesia Other Findings 68 y.o. female with medical history significant of anxiety, anemia,  ulcerative Colitis s/p total colectomy and ileostomy in 2016, HLD, HTN, SOB, DMII, history of small bowel obstructions, and recent PEs/DVTs started on Eliquis  (just admitted for this 6/12-6/14) who presented to emergency department due to acute anemia  Reproductive/Obstetrics                             Anesthesia Physical Anesthesia Plan  ASA: 3  Anesthesia Plan: MAC   Post-op Pain Management:    Induction: Intravenous  PONV Risk Score and Plan: Propofol  infusion and Treatment may vary due to age or medical condition  Airway Management Planned: Natural Airway  Additional Equipment:   Intra-op Plan:   Post-operative Plan:   Informed Consent: I have reviewed the patients History and Physical, chart, labs and discussed the procedure including the risks, benefits and alternatives for the proposed anesthesia with the patient or authorized representative who has indicated his/her understanding and acceptance.     Dental advisory given  Plan Discussed with: CRNA  Anesthesia Plan Comments:        Anesthesia Quick Evaluation

## 2024-05-04 ENCOUNTER — Encounter (HOSPITAL_COMMUNITY): Payer: Self-pay | Admitting: Internal Medicine

## 2024-05-04 ENCOUNTER — Inpatient Hospital Stay (HOSPITAL_COMMUNITY)

## 2024-05-04 DIAGNOSIS — I2699 Other pulmonary embolism without acute cor pulmonale: Secondary | ICD-10-CM | POA: Diagnosis not present

## 2024-05-04 DIAGNOSIS — K921 Melena: Secondary | ICD-10-CM

## 2024-05-04 DIAGNOSIS — K922 Gastrointestinal hemorrhage, unspecified: Secondary | ICD-10-CM | POA: Diagnosis not present

## 2024-05-04 LAB — APTT
aPTT: 66 s — ABNORMAL HIGH (ref 24–36)
aPTT: 136 s — ABNORMAL HIGH (ref 24–36)
aPTT: 115 s — ABNORMAL HIGH (ref 24–36)

## 2024-05-04 LAB — TYPE AND SCREEN
ABO/RH(D): B POS
Antibody Screen: NEGATIVE
Unit division: 0

## 2024-05-04 LAB — CBC
HCT: 29.1 % — ABNORMAL LOW (ref 36.0–46.0)
HCT: 27 % — ABNORMAL LOW (ref 36.0–46.0)
Hemoglobin: 9.4 g/dL — ABNORMAL LOW (ref 12.0–15.0)
Hemoglobin: 8.9 g/dL — ABNORMAL LOW (ref 12.0–15.0)
MCH: 29.4 pg (ref 26.0–34.0)
MCH: 29.5 pg (ref 26.0–34.0)
MCHC: 32.3 g/dL (ref 30.0–36.0)
MCHC: 33 g/dL (ref 30.0–36.0)
MCV: 90.9 fL (ref 80.0–100.0)
MCV: 89.4 fL (ref 80.0–100.0)
Platelets: 353 10*3/uL (ref 150–400)
Platelets: 372 10*3/uL (ref 150–400)
RBC: 3.2 MIL/uL — ABNORMAL LOW (ref 3.87–5.11)
RBC: 3.02 MIL/uL — ABNORMAL LOW (ref 3.87–5.11)
RDW: 14.5 % (ref 11.5–15.5)
RDW: 14.4 % (ref 11.5–15.5)
WBC: 9.6 10*3/uL (ref 4.0–10.5)
WBC: 15.4 10*3/uL — ABNORMAL HIGH (ref 4.0–10.5)
nRBC: 0 % (ref 0.0–0.2)
nRBC: 0.1 % (ref 0.0–0.2)

## 2024-05-04 LAB — BPAM RBC
Blood Product Expiration Date: 202507222359
ISSUE DATE / TIME: 202506251002
Unit Type and Rh: 1700

## 2024-05-04 LAB — HEPARIN LEVEL (UNFRACTIONATED)
Heparin Unfractionated: 0.69 [IU]/mL (ref 0.30–0.70)
Heparin Unfractionated: >1.1 [IU]/mL — ABNORMAL HIGH (ref 0.30–0.70)
Heparin Unfractionated: 1 [IU]/mL — ABNORMAL HIGH (ref 0.30–0.70)

## 2024-05-04 MED ORDER — HEPARIN (PORCINE) 25000 UT/250ML-% IV SOLN
700.0000 [IU]/h | INTRAVENOUS | Status: DC
  Administered 2024-05-04: 700 [IU]/h via INTRAVENOUS
  Filled 2024-05-04: qty 250

## 2024-05-04 MED ORDER — IOHEXOL 350 MG/ML SOLN
100.0000 mL | Freq: Once | INTRAVENOUS | Status: AC | PRN
  Administered 2024-05-04: 100 mL via INTRAVENOUS

## 2024-05-04 NOTE — Progress Notes (Addendum)
 PROGRESS NOTE    Latasha Wood  FMW:[id number] DOB: 12-02-1955 DOA: 05/02/2024 PCP: Joshua Debby CROME, MD   Brief Narrative:  Latasha Wood is a 68 y.o. female with medical history significant of  Anxiety, Anemia, Ulcerative Colitis s/p ileostomy, HLD, HTN, SOB, DMII who presented to emergency department due to acute anemia without overt GI bleeding.  Admission was recommended by hematology/oncology.  Hospitalist called for admission.   Assessment & Plan:   Principal Problem:   Anemia Active Problems:   Acute deep vein thrombosis (DVT) of both lower extremities (HCC)   Acute GI bleeding   Acute anemia, rule out GI bleed - Hemoglobin 6.8 at intake, 1 unit PRBC transfusion completed - Upper endoscopy unremarkable for stigmata of bleeding - Transition from heparin  drip to Eliquis  in the next 24 hours - Low threshold to repeat abdominal imaging to evaluate for possible IR target if bleeding resumes - High risk for capsule endoscopy/lower endoscopy given her complicated GI history  Hypokalemia - Likely secondary to GI losses given above ostomy and increased output, replete as appropriate   Subacute PE, stable  - Diagnosed at recent hospitalization, transition from heparin  drip to Eliquis  as above - Considered unprovoked at this time, patient does have notable thyroid  nodule/mass that needs outpatient imaging and workup, pending these findings this may be considered provoked if evaluation was revealing of neoplastic process  Thyroid  mass, unspecified, right - Right sided - noted previously nontoxic single nodule - follow up outpatient as scheduled - Unclear if this will need new imaging/uptake study/biopsy   Generalized anxiety- continue xanax   DVT prophylaxis: SCDs Start: 05/02/24 1811 Code Status:   Code Status: Full Code Family Communication: Husband and daughter at bedside  Status is: Inpatient  Dispo: The patient is from: Home              Anticipated d/c is to:  Home              Anticipated d/c date is: 24 to 48 hours              Patient currently not medically stable for discharge  Consultants:  GI  Procedures:  Upper endoscopy  Antimicrobials:  Peri-procedure  Subjective: No acute issues or events overnight denies nausea vomiting headache fevers chills chest pain or shortness of breath  Objective: Vitals:   05/03/24 1811 05/03/24 1958 05/03/24 2139 05/04/24 0502  BP: 106/68 102/71 100/63 110/68  Pulse: (!) 58 63 69 61  Resp: 16 15  16   Temp: 98.3 F (36.8 C) 98.3 F (36.8 C)  (!) 97.5 F (36.4 C)  TempSrc:  Oral  Axillary  SpO2: 100% 99%  99%  Weight:      Height:        Intake/Output Summary (Last 24 hours) at 05/04/2024 0700 Last data filed at 05/03/2024 2000 Gross per 24 hour  Intake 1145 ml  Output --  Net 1145 ml   Filed Weights   05/02/24 1852  Weight: 76.2 kg    Examination:  General:  Pleasantly resting in bed, No acute distress. HEENT:  Normocephalic atraumatic.  Sclerae nonicteric, noninjected.  Extraocular movements intact bilaterally. Neck:  Without mass or deformity.  Trachea is midline. Lungs:  Clear to auscultate bilaterally without rhonchi, wheeze, or rales. Heart:  Regular rate and rhythm.  Without murmurs, rubs, or gallops. Abdomen:  Soft, nontender, ostomy noted, empty. Extremities: Without cyanosis, clubbing, edema, or obvious deformity. Skin:  Warm and dry, no erythema.   Data  Reviewed: I have personally reviewed following labs and imaging studies  CBC: Recent Labs  Lab 05/02/24 0950 05/02/24 1931 05/03/24 0543 05/03/24 1419 05/03/24 2156 05/04/24 0138  WBC 9.5 9.9 7.9 7.0 9.5 9.6  NEUTROABS 6.2 6.2  --   --   --   --   HGB 7.5* 7.0* 6.8* 8.3* 9.2* 9.4*  HCT 22.0* 22.2* 21.2* 25.4* 28.2* 29.1*  MCV 85.9 92.5 92.6 89.1 89.0 90.9  PLT 379 356 333 327 349 353   Basic Metabolic Panel: Recent Labs  Lab 05/02/24 0950 05/02/24 1931 05/03/24 0045  NA 139 136 136  K 3.2* 2.7* 3.5   CL 103 98 99  CO2 30 28 27   GLUCOSE 106* 89 104*  BUN 22 24* 20  CREATININE 1.08* 1.17* 0.93  CALCIUM  9.2 9.0 9.1  MG  --  2.2  --   PHOS  --  4.2  --    GFR: Estimated Creatinine Clearance: 55.8 mL/min (by C-G formula based on SCr of 0.93 mg/dL).  Liver Function Tests: Recent Labs  Lab 05/02/24 0950 05/02/24 1931  AST 15 21  ALT 10 15  ALKPHOS 61 64  BILITOT 0.4 0.6  PROT 6.7 6.5  ALBUMIN  3.6 3.2*   Coagulation Profile: Recent Labs  Lab 05/02/24 1931  INR 1.1   Anemia Panel: Recent Labs    05/02/24 1931  VITAMINB12 397  FOLATE 18.2  FERRITIN 30  TIBC 378  IRON 41  RETICCTPCT 4.6*   No results found for this or any previous visit (from the past 240 hours).   Radiology Studies: CT ANGIO GI BLEED Result Date: 05/04/2024 CLINICAL DATA:  Remote total colectomy for ulcerative colitis, presents with lower GI bleed. Currently taking heparin  due 2 recently diagnosed PE on a CTA chest from 04/20/2024. EXAM: CTA ABDOMEN AND PELVIS WITHOUT AND WITH CONTRAST TECHNIQUE: Multidetector CT imaging of the abdomen and pelvis was performed using the standard protocol during bolus administration of intravenous contrast. Multiplanar reconstructed images and MIPs were obtained and reviewed to evaluate the vascular anatomy. RADIATION DOSE REDUCTION: This exam was performed according to the departmental dose-optimization program which includes automated exposure control, adjustment of the mA and/or kV according to patient size and/or use of iterative reconstruction technique. CONTRAST:  OMNIPAQUE  IOHEXOL  350 MG/ML SOLN COMPARISON:  CTA chest 04/20/2024, CTs of abdomen and pelvis with IV contrast 01/15/2020 and 08/01/2018. FINDINGS: VASCULAR Aorta: Normal caliber aorta without aneurysm, dissection, vasculitis or significant stenosis. There are mild-to-moderate patchy calcific plaques. Celiac: Patent without evidence of aneurysm, dissection, vasculitis or significant stenosis. SMA: Patent  without evidence of aneurysm, dissection, vasculitis or significant stenosis. Renals: Both renal arteries are patent without evidence of aneurysm, dissection, vasculitis, fibromuscular dysplasia or significant stenosis. IMA: Patent without evidence of aneurysm, dissection, vasculitis or significant stenosis. Inflow: Patent without evidence of aneurysm, dissection, vasculitis or significant stenosis. There are mild patchy nonstenosing calcifications in the internal iliac arteries. The common iliac and external iliac arteries are plaque free. Proximal Outflow: Bilateral common femoral and visualized portions of the superficial and profunda femoral arteries are patent without evidence of aneurysm, dissection, vasculitis or significant stenosis. Veins: Patent. Review of the MIP images confirms the above findings. NON-VASCULAR Lower chest: Still seen is partially occlusive thrombus 2 right lower lobe infrahilar segmental arteries, but it is no longer seen in the downstream small vessels below this. Thrombus is also no longer seen in the left lower lobe medial basal subsegmental arteries. The heart is not enlarged. There is  mild posterior atelectasis in the lower lobes. Chronic elevation right hemidiaphragm. Lung bases are clear of infiltrates. Hepatobiliary: Subcentimeter chronic cyst in segment 3. Otherwise unremarkable liver. Unremarkable gallbladder and bile ducts. Pancreas: No abnormality. Spleen: No abnormality. Adrenals/Urinary Tract: 2.1 cm Bosniak 1 posterior upper right renal cyst, Hounsfield density is 19. Stable subcentimeter Bosniak 2 cyst in the superior pole left kidney is too small to characterize. There is no adrenal mass. There is no renal mass enhancement, stones or hydronephrosis. Unremarkable bladder. Stomach/Bowel: There is no appreciable arterial or venous phase contrast extravasation into the bowel. Postsurgical changes of a total colectomy with right lower quadrant end ileostomy are again noted.  There is no evidence of a small-bowel obstruction. A surgically widened segment in the pelvis above the bladder is unchanged in appearance. The stomach is contracted with no focal abnormality. No focal mesenteric inflammatory changes are seen. Lymphatic: No lymphadenopathy is seen. Reproductive: Status post hysterectomy. No adnexal masses. Other: Small volume of pelvic ascites is chronically noted. A small low anterior pelvic wall midline hernia containing a short nonobstructed small bowel loop is again noted. There is no incarcerated hernia. There is no free hemorrhage or free air. Musculoskeletal: There are degenerative changes of the lumbar spine and hips. Advanced facet hypertrophy L4-5 and L5-S1, degenerative discs lower thoracic spine. No acute or other significant osseous findings. IMPRESSION: 1. No evidence of arterial or venous phase contrast extravasation into the bowel to suggest active GI bleeding. 2. Aortic and iliac atherosclerosis. 3. Persistent partially occlusive thrombus in 2 right lower lobe infrahilar segmental arteries, but no longer seen in the downstream small vessels below this. 4. Non-vascular. No acute CT findings in the abdomen or pelvis. 5. Stable small volume of pelvic ascites. 6. Stable small low anterior pelvic wall midline hernia containing a short nonobstructed small bowel loop. 7. Bosniak 1 and 2 renal cysts. 8. Degenerative changes of the spine and hips. Aortic Atherosclerosis (ICD10-I70.0). Electronically Signed   By: Francis Quam M.D.   On: 05/04/2024 03:56   Scheduled Meds:  losartan   50 mg Oral QHS   pantoprazole  (PROTONIX ) IV  40 mg Intravenous Q12H   potassium chloride   40 mEq Oral BID   Continuous Infusions:  heparin  950 Units/hr (05/03/24 1918)    LOS: 2 days   Time spent: 55 min  Elsie JAYSON Montclair, DO [facility]  If 7PM-7AM, please contact night-coverage [email or web address]  05/04/2024, 7:00 AM

## 2024-05-04 NOTE — Consult Note (Signed)
 WOC Nurse ostomy consult note Stoma type/location: RLQ ileostomy Stomal assessment/size: 1 budded pink and moist  bleeding has been ongoing.  Was increased while on Eliquis .  Was seen in clinic last month and recommended sizing up to next size pouch as she was using a presized pouch that was too small, resulting in trauma to stoma.  Peristomal assessment: occasional irritation and redness.  Uses stoma powder and skin pre, 2 piece convex coloplast pouches (presized)  This is not on [facility] formulary.  I will include formulary similar products, but patient is independent with ostomy care and may choose to bring her supplies from home.  Treatment options for stomal/peristomal skin: barrier ring and 2 piece convex pouch with belt Output liquid brown stool Ostomy pouching: 2pc. Coloplast convex system with belt  Education provided: No changes in pouching, ensure that barrier opening is cut large enough to avoid trauma to stoma.  Enrolled patient in [employer] DC program: NA  uses coloplast pouches Will not follow at this time.  Please re-consult if needed.  Darice Cooley MSN, RN, FNP-BC CWON Wound, Ostomy, Continence Nurse Outpatient [facility] [phone] Pager [phone]

## 2024-05-04 NOTE — Progress Notes (Signed)
   05/04/24 1312  TOC Brief Assessment  Insurance and Status Reviewed  Patient has primary care physician Yes  Home environment has been reviewed Home w/ spouse  Prior level of function: Independent  Prior/Current Home Services No current home services  Social Drivers of Health Review SDOH reviewed no interventions necessary  Readmission risk has been reviewed Yes  Transition of care needs no transition of care needs at this time

## 2024-05-04 NOTE — Progress Notes (Addendum)
 [facility] Progress Note  CC:  Anemia/drop in Hgb and black stool   Subjective:  No new complaints.  Feels fine.  Yellow-light brown liquid stool in ostomy.   Objective:  Vital signs in last 24 hours: Temp:  [97.5 F (36.4 C)-98.4 F (36.9 C)] 97.5 F (36.4 C) (06/26 0502) Pulse Rate:  [58-80] 61 (06/26 0502) Resp:  [11-18] 16 (06/26 0502) BP: (92-126)/(51-80) 110/68 (06/26 0502) SpO2:  [99 %-100 %] 99 % (06/26 0502) Last BM Date : 05/03/24 General:  Alert, Well-developed, in NAD Heart:  Regular rate and rhythm; no murmurs Pulm:  CTAB.  No W/R/R. Abdomen:  Soft, non-distended.  BS present.  Non-tender.  Ostomy noted on the right abdomen with yellow/light brown liquid stool. Neurologic:  Alert and oriented x 4;  grossly normal neurologically. Psych:  Alert and cooperative. Normal mood and affect.  Intake/Output from previous day: 06/25 0701 - 06/26 0700 In: 1145 [P.O.:240; I.V.:500; Blood:405] Out: -   Lab Results: Recent Labs    05/03/24 1419 05/03/24 2156 05/04/24 0138  WBC 7.0 9.5 9.6  HGB 8.3* 9.2* 9.4*  HCT 25.4* 28.2* 29.1*  PLT 327 349 353   BMET Recent Labs    05/02/24 0950 05/02/24 1931 05/03/24 0045  NA 139 136 136  K 3.2* 2.7* 3.5  CL 103 98 99  CO2 30 28 27   GLUCOSE 106* 89 104*  BUN 22 24* 20  CREATININE 1.08* 1.17* 0.93  CALCIUM  9.2 9.0 9.1   LFT Recent Labs    05/02/24 1931  PROT 6.5  ALBUMIN  3.2*  AST 21  ALT 15  ALKPHOS 64  BILITOT 0.6   PT/INR Recent Labs    05/02/24 1931  LABPROT 14.6  INR 1.1   CT ANGIO GI BLEED Result Date: 05/04/2024 CLINICAL DATA:  Remote total colectomy for ulcerative colitis, presents with lower GI bleed. Currently taking heparin  due 2 recently diagnosed PE on a CTA chest from 04/20/2024. EXAM: CTA ABDOMEN AND PELVIS WITHOUT AND WITH CONTRAST TECHNIQUE: Multidetector CT imaging of the abdomen and pelvis was performed using the standard protocol during bolus administration of  intravenous contrast. Multiplanar reconstructed images and MIPs were obtained and reviewed to evaluate the vascular anatomy. RADIATION DOSE REDUCTION: This exam was performed according to the departmental dose-optimization program which includes automated exposure control, adjustment of the mA and/or kV according to patient size and/or use of iterative reconstruction technique. CONTRAST:  OMNIPAQUE  IOHEXOL  350 MG/ML SOLN COMPARISON:  CTA chest 04/20/2024, CTs of abdomen and pelvis with IV contrast 01/15/2020 and 08/01/2018. FINDINGS: VASCULAR Aorta: Normal caliber aorta without aneurysm, dissection, vasculitis or significant stenosis. There are mild-to-moderate patchy calcific plaques. Celiac: Patent without evidence of aneurysm, dissection, vasculitis or significant stenosis. SMA: Patent without evidence of aneurysm, dissection, vasculitis or significant stenosis. Renals: Both renal arteries are patent without evidence of aneurysm, dissection, vasculitis, fibromuscular dysplasia or significant stenosis. IMA: Patent without evidence of aneurysm, dissection, vasculitis or significant stenosis. Inflow: Patent without evidence of aneurysm, dissection, vasculitis or significant stenosis. There are mild patchy nonstenosing calcifications in the internal iliac arteries. The common iliac and external iliac arteries are plaque free. Proximal Outflow: Bilateral common femoral and visualized portions of the superficial and profunda femoral arteries are patent without evidence of aneurysm, dissection, vasculitis or significant stenosis. Veins: Patent. Review of the MIP images confirms the above findings. NON-VASCULAR Lower chest: Still seen is partially occlusive thrombus 2 right lower lobe infrahilar segmental arteries, but it is no longer  seen in the downstream small vessels below this. Thrombus is also no longer seen in the left lower lobe medial basal subsegmental arteries. The heart is not enlarged. There is mild  posterior atelectasis in the lower lobes. Chronic elevation right hemidiaphragm. Lung bases are clear of infiltrates. Hepatobiliary: Subcentimeter chronic cyst in segment 3. Otherwise unremarkable liver. Unremarkable gallbladder and bile ducts. Pancreas: No abnormality. Spleen: No abnormality. Adrenals/Urinary Tract: 2.1 cm Bosniak 1 posterior upper right renal cyst, Hounsfield density is 19. Stable subcentimeter Bosniak 2 cyst in the superior pole left kidney is too small to characterize. There is no adrenal mass. There is no renal mass enhancement, stones or hydronephrosis. Unremarkable bladder. Stomach/Bowel: There is no appreciable arterial or venous phase contrast extravasation into the bowel. Postsurgical changes of a total colectomy with right lower quadrant end ileostomy are again noted. There is no evidence of a small-bowel obstruction. A surgically widened segment in the pelvis above the bladder is unchanged in appearance. The stomach is contracted with no focal abnormality. No focal mesenteric inflammatory changes are seen. Lymphatic: No lymphadenopathy is seen. Reproductive: Status post hysterectomy. No adnexal masses. Other: Small volume of pelvic ascites is chronically noted. A small low anterior pelvic wall midline hernia containing a short nonobstructed small bowel loop is again noted. There is no incarcerated hernia. There is no free hemorrhage or free air. Musculoskeletal: There are degenerative changes of the lumbar spine and hips. Advanced facet hypertrophy L4-5 and L5-S1, degenerative discs lower thoracic spine. No acute or other significant osseous findings. IMPRESSION: 1. No evidence of arterial or venous phase contrast extravasation into the bowel to suggest active GI bleeding. 2. Aortic and iliac atherosclerosis. 3. Persistent partially occlusive thrombus in 2 right lower lobe infrahilar segmental arteries, but no longer seen in the downstream small vessels below this. 4. Non-vascular. No  acute CT findings in the abdomen or pelvis. 5. Stable small volume of pelvic ascites. 6. Stable small low anterior pelvic wall midline hernia containing a short nonobstructed small bowel loop. 7. Bosniak 1 and 2 renal cysts. 8. Degenerative changes of the spine and hips. Aortic Atherosclerosis (ICD10-I70.0). Electronically Signed   By: Francis Quam M.D.   On: 05/04/2024 03:56   Assessment / Plan: *Acute drop in Hgb and black stool:  Hgb drop from 12 grams on 6/14 to 7.0 grams yesterday and now 6.8 grams this AM so received one unit of PRBCs with increase in Hgb to 9.4 grams today.  EGD on 6/25 with no sign of bleeding.  CTA with no sign of bleeding.  Stool now yellow liquid in ostomy bag. *Pulmonary Emboli; DVTs s/p Recent Hospital Admission 04/20/2024 until 04/23/2024 Just Started on Eliquis  which is now on hold. *History of Ulcerative Colitis (Dx Age 68), Failed multiple treatments; S/P Colectomy with Ileostomy in 2016.  Is scheduled for a CTE on Friday as outpatient. *History of small bowel obstructions  -Restart heparin  and see how she does. -Monitor for bleeding and monitor blood counts.  They want to know if they can have a printed order for CBC to be checked for when they go back to [place]. -Follow-up stool for Hpylori.  If positive then will treat. -Advance to soft diet today. -Wound care consult placed for complaints of intermittent bleeding from ostomy site.    LOS: 2 days   Harlene BIRCH. Zehr  05/04/2024, 8:47 AM     Attending physician's note   I have taken history, reviewed the chart and examined the patient. I performed a  substantive portion of this encounter, including complete performance of at least one of the key components, in conjunction with the APP. I agree with the Advanced Practitioner's note, impression and recommendations.   No further bleeding on heparin . Neg EGD, CTA. Hb stable.  Ideally would proceed with VCE. However, I am concerned about capsule retention due to  H/O SBO. We also discussed patency capsule prior to actual capsule. Since, she feels better and there is no bleeding, she would like to hold off currently.  Appreciate ostomy nurse/wound care consult.  Plan: Advance diet Trend CBC If any further bleeding in future, rpt CTA followed by patency capsule/VCE. Continue Protonix  40 mg p.o. every day #90. Follow results of H. pylori stool antigen. If +, will treat. Can transition to Eliquis  in AM FU GI as outpt Will sign off for now.   Anselm Bring, MD [facility] [phone]

## 2024-05-04 NOTE — Progress Notes (Signed)
 PHARMACY - ANTICOAGULATION CONSULT NOTE  Pharmacy Consult for IV heparin  Indication: VTE; PTA DOAC on hold  Allergies  Allergen Reactions   Betadine  [Povidone Iodine ] Rash and Other (See Comments)    The skin is very sensitive   Latex Rash and Other (See Comments)    Redness of skin   Tape Rash and Other (See Comments)    Redness of skin OK with hypofix tape and paper tape! NO transpore tape adhesive tape!    Patient Measurements: Height: 5' 3 (160 cm) Weight: 76.2 kg (168 lb) IBW/kg (Calculated) : 52.4 HEPARIN  DW (KG): 68.7  Vital Signs: Temp: 99 F (37.2 C) (06/26 2122) Temp Source: Oral (06/26 2122) BP: 111/60 (06/26 2122) Pulse Rate: 80 (06/26 2122)  Labs: Recent Labs    05/02/24 0950 05/02/24 0950 05/02/24 1931 05/03/24 0045 05/03/24 0543 05/03/24 2156 05/04/24 0138 05/04/24 0957 05/04/24 1349 05/04/24 2125  HGB 7.5*  --  7.0*  --    < > 9.2* 9.4*  --  8.9*  --   HCT 22.0*  --  22.2*  --    < > 28.2* 29.1*  --  27.0*  --   PLT 379  --  356  --    < > 349 353  --  372  --   APTT  --    < > 29  --   --   --  66* 136*  --  115*  LABPROT  --   --  14.6  --   --   --   --   --   --   --   INR  --   --  1.1  --   --   --   --   --   --   --   HEPARINUNFRC  --    < > >1.10*  --   --   --  0.69 >1.10*  --  1.00*  CREATININE 1.08*  --  1.17* 0.93  --   --   --   --   --   --    < > = values in this interval not displayed.    Estimated Creatinine Clearance: 55.8 mL/min (by C-G formula based on SCr of 0.93 mg/dL).   Medical History: Past Medical History:  Diagnosis Date   Anemia    Anxiety    Arthritis    Blood transfusion    Bowel obstruction (HCC)    Colitis, ulcerative (HCC)    Colon polyps    DVT (deep venous thrombosis) (HCC)    Hyperlipidemia    Hypertension    Ileostomy present (HCC)    Kidney disease    Numbness and tingling    hands and feet bilat    Pulmonary embolism (HCC)    Shortness of breath dyspnea    talking or walking    Type 2  diabetes mellitus with complication, without long-term current use of insulin  (HCC) 09/05/2015   currently on no medications, (05/26/2016) pt denies diabetes.  States that she had one time high blood sugars d/t prednisone     Medications:  Medications Prior to Admission  Medication Sig Dispense Refill Last Dose/Taking   ALPRAZolam  (XANAX ) 0.25 MG tablet Take 1 tablet (0.25 mg total) by mouth 2 (two) times daily as needed for anxiety. 180 tablet 0 Unknown   apixaban  (ELIQUIS ) 5 MG TABS tablet Take 1 tablet (5 mg total) by mouth 2 (two) times daily. 360 tablet 0 05/02/2024 at  7:30 AM  cholecalciferol (VITAMIN D3) 25 MCG (1000 UNIT) tablet Take 1,000 Units by mouth daily.   05/02/2024 Morning   hydrocortisone  2.5 % cream Apply topically 2 (two) times daily. (Patient taking differently: Apply 1 Application topically 2 (two) times daily as needed (for irritation).) 30 g 3 Unknown   indapamide  (LOZOL ) 1.25 MG tablet Take 1 tablet (1.25 mg total) by mouth daily. 100 tablet 0 05/02/2024 Morning   losartan  (COZAAR ) 25 MG tablet Take 2 tablets (50 mg total) by mouth at bedtime.   05/01/2024 Bedtime   Multiple Vitamin (MULTIVITAMIN WITH MINERALS) TABS tablet Take 1 tablet by mouth daily with breakfast.   Unknown   nystatin cream (MYCOSTATIN) Apply 1 Application topically 2 (two) times daily. (Patient taking differently: Apply 1 Application topically See admin instructions. Apply to affected area 2 times a day) 60 g 1 05/01/2024 Evening   potassium chloride  (KLOR-CON  10) 10 MEQ tablet Take 1 tablet (10 mEq total) by mouth 2 (two) times daily. (Patient not taking: Reported on 05/02/2024)   Not Taking   Scheduled:   losartan   50 mg Oral QHS   pantoprazole  (PROTONIX ) IV  40 mg Intravenous Q12H   potassium chloride   40 mEq Oral BID   PRN: acetaminophen  **OR** acetaminophen , ALPRAZolam , ondansetron  **OR** ondansetron  (ZOFRAN ) IV  Assessment: 81 yoF with PMH of UC s/p colectomy/colostomy, HTN/HLD, recently  diagnosed bilat PE/DVT on Eliquis  PTA, sent to ED by [facility] for anemia. Was being seen by HemOnc for coag workup and noted to have worsening Hgb, thought possibly d/t recurrent GI bleeding related to UC or ostomy. Pharmacy to dose IV heparin  while Eliquis  on hold.   Prior anticoagulation: [name]; LD 6/24 @ 0730   Significant events: 6/25 EGD No biopsy obtain No UGI bleed Ok with GI to start IV heparin  with no delay following procedure  Today, 05/04/2024: aPTT 136 - supratherapeutic on heparin  950 units/hr HL >1.10  - supratherapeutic but may also be influenced by recent Eliquis  administration  Confirmed with phlebotomy and RN that heparin  level was drawn from the opposite arm that heparin  is infusing  Hgb 9.4, plts 353 - stable No bleeding reported   2115 aPTT 115 supra-therapeutic on 800 units/hr HL 1.0  Goal of Therapy: Heparin  level 0.3-0.7 units/ml aPTT 66-102 seconds Monitor platelets by anticoagulation protocol: Yes  Plan: Decrease heparin  drip to 700 units/hr Check aPTT and heparin  level 6hrs after heparin  rate adjustment Anticipate elevated heparin  (anti-Xa) levels d/t recent Eliquis  Monitor/adjust heparin  per aPTT until heparin  levels correlate w/ aPTT Daily CBC, daily heparin  level once stable Monitor for signs of bleeding or thrombosis Per MD, likely transition back to Eliquis  in next 24hrs   Leeroy Mace RPh 05/04/2024, 10:38 PM

## 2024-05-04 NOTE — Progress Notes (Signed)
 PHARMACY - ANTICOAGULATION CONSULT NOTE  Pharmacy Consult for IV heparin  Indication: VTE; PTA DOAC on hold  Allergies  Allergen Reactions   Betadine  [Povidone Iodine ] Rash and Other (See Comments)    The skin is very sensitive   Latex Rash and Other (See Comments)    Redness of skin   Tape Rash and Other (See Comments)    Redness of skin OK with hypofix tape and paper tape! NO transpore tape adhesive tape!    Patient Measurements: Height: 5' 3 (160 cm) Weight: 76.2 kg (168 lb) IBW/kg (Calculated) : 52.4 HEPARIN  DW (KG): 68.7  Vital Signs: Temp: 97.5 F (36.4 C) (06/26 0502) Temp Source: Axillary (06/26 0502) BP: 110/68 (06/26 0502) Pulse Rate: 61 (06/26 0502)  Labs: Recent Labs    05/02/24 0950 05/02/24 1931 05/03/24 0045 05/03/24 0543 05/03/24 1419 05/03/24 2156 05/04/24 0138 05/04/24 0957  HGB 7.5* 7.0*  --    < > 8.3* 9.2* 9.4*  --   HCT 22.0* 22.2*  --    < > 25.4* 28.2* 29.1*  --   PLT 379 356  --    < > 327 349 353  --   APTT  --  29  --   --   --   --  66* 136*  LABPROT  --  14.6  --   --   --   --   --   --   INR  --  1.1  --   --   --   --   --   --   HEPARINUNFRC  --  >1.10*  --   --   --   --  0.69 >1.10*  CREATININE 1.08* 1.17* 0.93  --   --   --   --   --    < > = values in this interval not displayed.    Estimated Creatinine Clearance: 55.8 mL/min (by C-G formula based on SCr of 0.93 mg/dL).   Medical History: Past Medical History:  Diagnosis Date   Anemia    Anxiety    Arthritis    Blood transfusion    Bowel obstruction (HCC)    Colitis, ulcerative (HCC)    Colon polyps    DVT (deep venous thrombosis) (HCC)    Hyperlipidemia    Hypertension    Ileostomy present (HCC)    Kidney disease    Numbness and tingling    hands and feet bilat    Pulmonary embolism (HCC)    Shortness of breath dyspnea    talking or walking    Type 2 diabetes mellitus with complication, without long-term current use of insulin  (HCC) 09/05/2015    currently on no medications, (05/26/2016) pt denies diabetes.  States that she had one time high blood sugars d/t prednisone     Medications:  Medications Prior to Admission  Medication Sig Dispense Refill Last Dose/Taking   ALPRAZolam  (XANAX ) 0.25 MG tablet Take 1 tablet (0.25 mg total) by mouth 2 (two) times daily as needed for anxiety. 180 tablet 0 Unknown   apixaban  (ELIQUIS ) 5 MG TABS tablet Take 1 tablet (5 mg total) by mouth 2 (two) times daily. 360 tablet 0 05/02/2024 at  7:30 AM   cholecalciferol (VITAMIN D3) 25 MCG (1000 UNIT) tablet Take 1,000 Units by mouth daily.   05/02/2024 Morning   hydrocortisone  2.5 % cream Apply topically 2 (two) times daily. (Patient taking differently: Apply 1 Application topically 2 (two) times daily as needed (for irritation).) 30 g 3  Unknown   indapamide  (LOZOL ) 1.25 MG tablet Take 1 tablet (1.25 mg total) by mouth daily. 100 tablet 0 05/02/2024 Morning   losartan  (COZAAR ) 25 MG tablet Take 2 tablets (50 mg total) by mouth at bedtime.   05/01/2024 Bedtime   Multiple Vitamin (MULTIVITAMIN WITH MINERALS) TABS tablet Take 1 tablet by mouth daily with breakfast.   Unknown   nystatin cream (MYCOSTATIN) Apply 1 Application topically 2 (two) times daily. (Patient taking differently: Apply 1 Application topically See admin instructions. Apply to affected area 2 times a day) 60 g 1 05/01/2024 Evening   potassium chloride  (KLOR-CON  10) 10 MEQ tablet Take 1 tablet (10 mEq total) by mouth 2 (two) times daily. (Patient not taking: Reported on 05/02/2024)   Not Taking   Scheduled:   losartan   50 mg Oral QHS   pantoprazole  (PROTONIX ) IV  40 mg Intravenous Q12H   potassium chloride   40 mEq Oral BID   PRN: acetaminophen  **OR** acetaminophen , ALPRAZolam , ondansetron  **OR** ondansetron  (ZOFRAN ) IV  Assessment: 12 yoF with PMH of UC s/p colectomy/colostomy, HTN/HLD, recently diagnosed bilat PE/DVT on Eliquis  PTA, sent to ED by [facility] for anemia. Was being seen by HemOnc for coag  workup and noted to have worsening Hgb, thought possibly d/t recurrent GI bleeding related to UC or ostomy. Pharmacy to dose IV heparin  while Eliquis  on hold.   Prior anticoagulation: [name]; LD 6/24 @ 0730   Significant events: 6/25 EGD No biopsy obtain No UGI bleed Ok with GI to start IV heparin  with no delay following procedure  Today, 05/04/2024: aPTT 136 - supratherapeutic on heparin  950 units/hr HL >1.10  - supratherapeutic but may also be influenced by recent Eliquis  administration  Confirmed with phlebotomy and RN that heparin  level was drawn from the opposite arm that heparin  is infusing  Hgb 9.4, plts 353 - stable No bleeding reported  Goal of Therapy: Heparin  level 0.3-0.7 units/ml aPTT 66-102 seconds Monitor platelets by anticoagulation protocol: Yes  Plan: Hold heparin  gtt x1 hour then restart at a reduced rate of 800 units/hr Check aPTT and heparin  level 6hrs after heparin  rate adjustment Anticipate elevated heparin  (anti-Xa) levels d/t recent Eliquis  Monitor/adjust heparin  per aPTT until heparin  levels correlate w/ aPTT Daily CBC, daily heparin  level once stable Monitor for signs of bleeding or thrombosis Per MD, likely transition back to Eliquis  in next 24hrs    Lacinda Moats, PharmD Clinical Pharmacist  6/26/202512:05 PM

## 2024-05-04 NOTE — Progress Notes (Signed)
 PHARMACY - ANTICOAGULATION CONSULT NOTE  Pharmacy Consult for IV heparin  Indication: VTE; PTA DOAC on hold  Allergies  Allergen Reactions   Betadine  [Povidone Iodine ] Rash and Other (See Comments)    The skin is very sensitive   Latex Rash and Other (See Comments)    Redness of skin   Tape Rash and Other (See Comments)    Redness of skin OK with hypofix tape and paper tape! NO transpore tape adhesive tape!    Patient Measurements: Height: 5' 3 (160 cm) Weight: 76.2 kg (168 lb) IBW/kg (Calculated) : 52.4 HEPARIN  DW (KG): 68.7  Vital Signs: Temp: 98.3 F (36.8 C) (06/25 1958) Temp Source: Oral (06/25 1958) BP: 100/63 (06/25 2139) Pulse Rate: 69 (06/25 2139)  Labs: Recent Labs    05/02/24 0950 05/02/24 1931 05/03/24 0045 05/03/24 0543 05/03/24 1419 05/03/24 2156 05/04/24 0138  HGB 7.5* 7.0*  --    < > 8.3* 9.2* 9.4*  HCT 22.0* 22.2*  --    < > 25.4* 28.2* 29.1*  PLT 379 356  --    < > 327 349 353  APTT  --  29  --   --   --   --  66*  LABPROT  --  14.6  --   --   --   --   --   INR  --  1.1  --   --   --   --   --   HEPARINUNFRC  --  >1.10*  --   --   --   --  0.69  CREATININE 1.08* 1.17* 0.93  --   --   --   --    < > = values in this interval not displayed.    Estimated Creatinine Clearance: 55.8 mL/min (by C-G formula based on SCr of 0.93 mg/dL).   Medical History: Past Medical History:  Diagnosis Date   Anemia    Anxiety    Arthritis    Blood transfusion    Bowel obstruction (HCC)    Colitis, ulcerative (HCC)    Colon polyps    DVT (deep venous thrombosis) (HCC)    Hyperlipidemia    Hypertension    Ileostomy present (HCC)    Kidney disease    Numbness and tingling    hands and feet bilat    Pulmonary embolism (HCC)    Shortness of breath dyspnea    talking or walking    Type 2 diabetes mellitus with complication, without long-term current use of insulin  (HCC) 09/05/2015   currently on no medications, (05/26/2016) pt denies diabetes.  States  that she had one time high blood sugars d/t prednisone     Medications:  Medications Prior to Admission  Medication Sig Dispense Refill Last Dose/Taking   ALPRAZolam  (XANAX ) 0.25 MG tablet Take 1 tablet (0.25 mg total) by mouth 2 (two) times daily as needed for anxiety. 180 tablet 0 Unknown   apixaban  (ELIQUIS ) 5 MG TABS tablet Take 1 tablet (5 mg total) by mouth 2 (two) times daily. 360 tablet 0 05/02/2024 at  7:30 AM   cholecalciferol (VITAMIN D3) 25 MCG (1000 UNIT) tablet Take 1,000 Units by mouth daily.   05/02/2024 Morning   hydrocortisone  2.5 % cream Apply topically 2 (two) times daily. (Patient taking differently: Apply 1 Application topically 2 (two) times daily as needed (for irritation).) 30 g 3 Unknown   indapamide  (LOZOL ) 1.25 MG tablet Take 1 tablet (1.25 mg total) by mouth daily. 100 tablet 0 05/02/2024 Morning  losartan  (COZAAR ) 25 MG tablet Take 2 tablets (50 mg total) by mouth at bedtime.   05/01/2024 Bedtime   Multiple Vitamin (MULTIVITAMIN WITH MINERALS) TABS tablet Take 1 tablet by mouth daily with breakfast.   Unknown   nystatin cream (MYCOSTATIN) Apply 1 Application topically 2 (two) times daily. (Patient taking differently: Apply 1 Application topically See admin instructions. Apply to affected area 2 times a day) 60 g 1 05/01/2024 Evening   potassium chloride  (KLOR-CON  10) 10 MEQ tablet Take 1 tablet (10 mEq total) by mouth 2 (two) times daily. (Patient not taking: Reported on 05/02/2024)   Not Taking   Scheduled:   losartan   50 mg Oral QHS   pantoprazole  (PROTONIX ) IV  40 mg Intravenous Q12H   potassium chloride   40 mEq Oral BID   PRN: acetaminophen  **OR** acetaminophen , ALPRAZolam , ondansetron  **OR** ondansetron  (ZOFRAN ) IV  Assessment: 22 yoF with PMH of UC s/p colectomy/colostomy, HTN/HLD, recently diagnosed bilat PE/DVT on Eliquis  PTA, sent to ED by [facility] for anemia. Was being seen by HemOnc for coag workup and noted to have worsening Hgb, thought possibly d/t recurrent  GI bleeding related to UC or ostomy. Pharmacy to dose IV heparin  while Eliquis  on hold.   Prior anticoagulation: [name]; LD 6/24 @ 0730   Significant events: 6/25 EGD No biopsy obtain No UGI bleed Ok with GI to start IV heparin  with no delay following procedure  05/04/2024 aPTT 66 therapeutic on 950 units/hr HL 0.69 still elevated Hgb 9.4, plts 353 No bleeding reported   Goal of Therapy: Heparin  level 0.3-0.7 units/ml aPTT 66-102 seconds Monitor platelets by anticoagulation protocol: Yes  Plan: continue Heparin  950 units/hr IV infusion Check aPTT level  in 6-8 hrs Anticipate elevated heparin  (anti-Xa) levels d/t recent Eliquis  Monitor/adjust heparin  per aPTT until heparin  levels correlate w/ aPTT Daily CBC, daily heparin  level once stable Monitor for signs of bleeding or thrombosis    Leeroy Mace RPh 05/04/2024, 2:55 AM

## 2024-05-04 NOTE — Consult Note (Addendum)
 WOC Nurse re- consult Note: Refer to WOC consult note eatlier today; this was performed remotely by Darice Slocumb, who is familiar with the patient from the outpatient ostomy clinic.   Requested to see the patient in person by the primary team  She has already performed a pouch change earlier this morning.  She states the stoma bleeds slightly at times during pouch changes.  There is no blood in the pouch or to the stoma area when assessed.  Pouch is intact with good seal and mod amt tan liquid stool, no bloody stool at this time.  Pt has her own supplies at the bedside and is very well-informed regarding ostomy care.  No further role for WOC team.  Please re-consult if further assistance is needed.  Thank-you,  Stephane Fought MSN, RN, CWOCN, [place], CNS [phone]

## 2024-05-04 NOTE — Anesthesia Postprocedure Evaluation (Signed)
 Anesthesia Post Note  Patient: [name]  Procedure(s) Performed: EGD (ESOPHAGOGASTRODUODENOSCOPY)     Patient location during evaluation: Endoscopy Anesthesia Type: MAC Level of consciousness: awake and alert Pain management: pain level controlled Vital Signs Assessment: post-procedure vital signs reviewed and stable Respiratory status: spontaneous breathing, nonlabored ventilation, respiratory function stable and patient connected to nasal cannula oxygen Cardiovascular status: blood pressure returned to baseline and stable Postop Assessment: no apparent nausea or vomiting Anesthetic complications: no  No notable events documented.  Last Vitals:  Vitals:   05/03/24 2139 05/04/24 0502  BP: 100/63 110/68  Pulse: 69 61  Resp:  16  Temp:  (!) 36.4 C  SpO2:  99%    Last Pain:  Vitals:   05/04/24 0502  TempSrc: Axillary  PainSc:    Pain Goal:                   Mercia Dowe L Cataleyah Colborn

## 2024-05-05 ENCOUNTER — Ambulatory Visit (HOSPITAL_COMMUNITY)

## 2024-05-05 DIAGNOSIS — K922 Gastrointestinal hemorrhage, unspecified: Secondary | ICD-10-CM | POA: Diagnosis not present

## 2024-05-05 DIAGNOSIS — K921 Melena: Principal | ICD-10-CM

## 2024-05-05 DIAGNOSIS — I2699 Other pulmonary embolism without acute cor pulmonale: Secondary | ICD-10-CM | POA: Diagnosis not present

## 2024-05-05 DIAGNOSIS — D509 Iron deficiency anemia, unspecified: Secondary | ICD-10-CM | POA: Diagnosis not present

## 2024-05-05 LAB — APTT
aPTT: 146 s — ABNORMAL HIGH (ref 24–36)
aPTT: 91 s — ABNORMAL HIGH (ref 24–36)

## 2024-05-05 LAB — CBC
HCT: 25.2 % — ABNORMAL LOW (ref 36.0–46.0)
Hemoglobin: 8 g/dL — ABNORMAL LOW (ref 12.0–15.0)
MCH: 29.4 pg (ref 26.0–34.0)
MCHC: 31.7 g/dL (ref 30.0–36.0)
MCV: 92.6 fL (ref 80.0–100.0)
Platelets: 332 10*3/uL (ref 150–400)
RBC: 2.72 MIL/uL — ABNORMAL LOW (ref 3.87–5.11)
RDW: 14.7 % (ref 11.5–15.5)
WBC: 12.7 10*3/uL — ABNORMAL HIGH (ref 4.0–10.5)
nRBC: 0 % (ref 0.0–0.2)

## 2024-05-05 LAB — HEPARIN LEVEL (UNFRACTIONATED): Heparin Unfractionated: >1.1 [IU]/mL — ABNORMAL HIGH (ref 0.30–0.70)

## 2024-05-05 LAB — HEMOGLOBIN AND HEMATOCRIT, BLOOD
HCT: 29.1 % — ABNORMAL LOW (ref 36.0–46.0)
Hemoglobin: 9.3 g/dL — ABNORMAL LOW (ref 12.0–15.0)

## 2024-05-05 LAB — H. PYLORI ANTIGEN, STOOL: H. Pylori Stool Ag, Eia: NEGATIVE

## 2024-05-05 MED ORDER — PANTOPRAZOLE SODIUM 40 MG PO TBEC: 40.0000 mg | DELAYED_RELEASE_TABLET | Freq: Every day | ORAL | 0 refills | Status: DC

## 2024-05-05 MED ORDER — HEPARIN (PORCINE) 25000 UT/250ML-% IV SOLN: 550.0000 [IU]/h | INTRAVENOUS | Status: DC

## 2024-05-05 MED ORDER — APIXABAN 5 MG PO TABS
5.0000 mg | ORAL_TABLET | Freq: Two times a day (BID) | ORAL | Status: DC
  Administered 2024-05-05: 5 mg via ORAL
  Filled 2024-05-05: qty 1

## 2024-05-05 MED ORDER — POTASSIUM CHLORIDE ER 10 MEQ PO TBCR: 10.0000 meq | EXTENDED_RELEASE_TABLET | Freq: Every day | ORAL | 0 refills | Status: DC

## 2024-05-05 NOTE — Plan of Care (Signed)

## 2024-05-05 NOTE — Progress Notes (Signed)
 PHARMACY - ANTICOAGULATION CONSULT NOTE  Pharmacy Consult for IV heparin  >> Eliquis  Indication: VTE; PTA DOAC on hold  Allergies  Allergen Reactions   Betadine  [Povidone Iodine ] Rash and Other (See Comments)    The skin is very sensitive   Latex Rash and Other (See Comments)    Redness of skin   Tape Rash and Other (See Comments)    Redness of skin OK with hypofix tape and paper tape! NO transpore tape adhesive tape!    Patient Measurements: Height: 5' 3 (160 cm) Weight: 76.2 kg (168 lb) IBW/kg (Calculated) : 52.4 HEPARIN  DW (KG): 68.7  Vital Signs:    Labs: Recent Labs    05/02/24 1931 05/03/24 0045 05/03/24 0543 05/04/24 0138 05/04/24 0957 05/04/24 1349 05/04/24 2125 05/05/24 0538 05/05/24 1627  HGB 7.0*  --    < > 9.4*  --  8.9*  --  8.0* 9.3*  HCT 22.2*  --    < > 29.1*  --  27.0*  --  25.2* 29.1*  PLT 356  --    < > 353  --  372  --  332  --   APTT 29  --   --  66* 136*  --  115* 146* 91*  LABPROT 14.6  --   --   --   --   --   --   --   --   INR 1.1  --   --   --   --   --   --   --   --   HEPARINUNFRC >1.10*  --   --  0.69 >1.10*  --  1.00* >1.10*  --   CREATININE 1.17* 0.93  --   --   --   --   --   --   --    < > = values in this interval not displayed.    Estimated Creatinine Clearance: 55.8 mL/min (by C-G formula based on SCr of 0.93 mg/dL).   Medical History: Past Medical History:  Diagnosis Date   Anemia    Anxiety    Arthritis    Blood transfusion    Bowel obstruction (HCC)    Colitis, ulcerative (HCC)    Colon polyps    DVT (deep venous thrombosis) (HCC)    Hyperlipidemia    Hypertension    Ileostomy present (HCC)    Kidney disease    Numbness and tingling    hands and feet bilat    Pulmonary embolism (HCC)    Shortness of breath dyspnea    talking or walking    Type 2 diabetes mellitus with complication, without long-term current use of insulin  (HCC) 09/05/2015   currently on no medications, (05/26/2016) pt denies diabetes.   States that she had one time high blood sugars d/t prednisone     Medications:  Medications Prior to Admission  Medication Sig Dispense Refill Last Dose/Taking   ALPRAZolam  (XANAX ) 0.25 MG tablet Take 1 tablet (0.25 mg total) by mouth 2 (two) times daily as needed for anxiety. 180 tablet 0 Unknown   apixaban  (ELIQUIS ) 5 MG TABS tablet Take 1 tablet (5 mg total) by mouth 2 (two) times daily. 360 tablet 0 05/02/2024 at  7:30 AM   cholecalciferol (VITAMIN D3) 25 MCG (1000 UNIT) tablet Take 1,000 Units by mouth daily.   05/02/2024 Morning   hydrocortisone  2.5 % cream Apply topically 2 (two) times daily. (Patient taking differently: Apply 1 Application topically 2 (two) times daily as needed (for irritation).)  30 g 3 Unknown   indapamide  (LOZOL ) 1.25 MG tablet Take 1 tablet (1.25 mg total) by mouth daily. 100 tablet 0 05/02/2024 Morning   losartan  (COZAAR ) 25 MG tablet Take 2 tablets (50 mg total) by mouth at bedtime.   05/01/2024 Bedtime   Multiple Vitamin (MULTIVITAMIN WITH MINERALS) TABS tablet Take 1 tablet by mouth daily with breakfast.   Unknown   nystatin cream (MYCOSTATIN) Apply 1 Application topically 2 (two) times daily. (Patient taking differently: Apply 1 Application topically See admin instructions. Apply to affected area 2 times a day) 60 g 1 05/01/2024 Evening   potassium chloride  (KLOR-CON  10) 10 MEQ tablet Take 1 tablet (10 mEq total) by mouth 2 (two) times daily. (Patient not taking: Reported on 05/02/2024)   Not Taking   Scheduled:   apixaban   5 mg Oral BID   losartan   50 mg Oral QHS   pantoprazole  (PROTONIX ) IV  40 mg Intravenous Q12H   potassium chloride   40 mEq Oral BID   PRN: acetaminophen  **OR** acetaminophen , ALPRAZolam , ondansetron  **OR** ondansetron  (ZOFRAN ) IV  Assessment: 89 yoF with PMH of UC s/p colectomy/colostomy, HTN/HLD, recently diagnosed bilat PE/DVT on Eliquis  PTA, sent to ED by [facility] for anemia. Was being seen by HemOnc for coag workup and noted to have worsening  Hgb, thought possibly d/t recurrent GI bleeding related to UC or ostomy. Pharmacy to dose IV heparin  while Eliquis  on hold.   Prior anticoagulation: [name]; LD 6/24 @ 0730   Significant events: 6/25 EGD No biopsy obtain No UGI bleed Ok with GI to start IV heparin  with no delay following procedure  Today, 05/05/2024: aPTT 91 seconds - therapeutic on heparin  550 units/hr Hgb improved to 9.3.  Pltc wnl No bleeding reported Per MD, switch back to PTA Eliquis   Goal of Therapy: Heparin  level 0.3-0.7 units/ml aPTT 66-102 seconds Monitor platelets by anticoagulation protocol: Yes  Plan: Stop heparin  Start Eliquis  5mg  BID this evening once heparin  gtt is stopped  Continue to monitor for s/sx of bleeding    Lacinda Moats, PharmD Clinical Pharmacist  6/27/20255:51 PM

## 2024-05-05 NOTE — Progress Notes (Signed)
 PHARMACY - ANTICOAGULATION CONSULT NOTE  Pharmacy Consult for IV heparin  Indication: VTE; PTA DOAC on hold  Allergies  Allergen Reactions   Betadine  [Povidone Iodine ] Rash and Other (See Comments)    The skin is very sensitive   Latex Rash and Other (See Comments)    Redness of skin   Tape Rash and Other (See Comments)    Redness of skin OK with hypofix tape and paper tape! NO transpore tape adhesive tape!    Patient Measurements: Height: 5' 3 (160 cm) Weight: 76.2 kg (168 lb) IBW/kg (Calculated) : 52.4 HEPARIN  DW (KG): 68.7  Vital Signs: Temp: 98 F (36.7 C) (06/27 0527) Temp Source: Oral (06/27 0527) BP: 109/71 (06/27 0527) Pulse Rate: 59 (06/27 0527)  Labs: Recent Labs    05/02/24 0950 05/02/24 0950 05/02/24 1931 05/03/24 0045 05/03/24 0543 05/04/24 0138 05/04/24 0957 05/04/24 1349 05/04/24 2125 05/05/24 0538  HGB 7.5*  --  7.0*  --    < > 9.4*  --  8.9*  --  8.0*  HCT 22.0*  --  22.2*  --    < > 29.1*  --  27.0*  --  25.2*  PLT 379  --  356  --    < > 353  --  372  --  332  APTT  --    < > 29  --   --  66* 136*  --  115* 146*  LABPROT  --   --  14.6  --   --   --   --   --   --   --   INR  --   --  1.1  --   --   --   --   --   --   --   HEPARINUNFRC  --    < > >1.10*  --   --  0.69 >1.10*  --  1.00* >1.10*  CREATININE 1.08*  --  1.17* 0.93  --   --   --   --   --   --    < > = values in this interval not displayed.    Estimated Creatinine Clearance: 55.8 mL/min (by C-G formula based on SCr of 0.93 mg/dL).   Medical History: Past Medical History:  Diagnosis Date   Anemia    Anxiety    Arthritis    Blood transfusion    Bowel obstruction (HCC)    Colitis, ulcerative (HCC)    Colon polyps    DVT (deep venous thrombosis) (HCC)    Hyperlipidemia    Hypertension    Ileostomy present (HCC)    Kidney disease    Numbness and tingling    hands and feet bilat    Pulmonary embolism (HCC)    Shortness of breath dyspnea    talking or walking    Type  2 diabetes mellitus with complication, without long-term current use of insulin  (HCC) 09/05/2015   currently on no medications, (05/26/2016) pt denies diabetes.  States that she had one time high blood sugars d/t prednisone     Medications:  Medications Prior to Admission  Medication Sig Dispense Refill Last Dose/Taking   ALPRAZolam  (XANAX ) 0.25 MG tablet Take 1 tablet (0.25 mg total) by mouth 2 (two) times daily as needed for anxiety. 180 tablet 0 Unknown   apixaban  (ELIQUIS ) 5 MG TABS tablet Take 1 tablet (5 mg total) by mouth 2 (two) times daily. 360 tablet 0 05/02/2024 at  7:30 AM   cholecalciferol (VITAMIN  D3) 25 MCG (1000 UNIT) tablet Take 1,000 Units by mouth daily.   05/02/2024 Morning   hydrocortisone  2.5 % cream Apply topically 2 (two) times daily. (Patient taking differently: Apply 1 Application topically 2 (two) times daily as needed (for irritation).) 30 g 3 Unknown   indapamide  (LOZOL ) 1.25 MG tablet Take 1 tablet (1.25 mg total) by mouth daily. 100 tablet 0 05/02/2024 Morning   losartan  (COZAAR ) 25 MG tablet Take 2 tablets (50 mg total) by mouth at bedtime.   05/01/2024 Bedtime   Multiple Vitamin (MULTIVITAMIN WITH MINERALS) TABS tablet Take 1 tablet by mouth daily with breakfast.   Unknown   nystatin cream (MYCOSTATIN) Apply 1 Application topically 2 (two) times daily. (Patient taking differently: Apply 1 Application topically See admin instructions. Apply to affected area 2 times a day) 60 g 1 05/01/2024 Evening   potassium chloride  (KLOR-CON  10) 10 MEQ tablet Take 1 tablet (10 mEq total) by mouth 2 (two) times daily. (Patient not taking: Reported on 05/02/2024)   Not Taking   Scheduled:   losartan   50 mg Oral QHS   pantoprazole  (PROTONIX ) IV  40 mg Intravenous Q12H   potassium chloride   40 mEq Oral BID   PRN: acetaminophen  **OR** acetaminophen , ALPRAZolam , ondansetron  **OR** ondansetron  (ZOFRAN ) IV  Assessment: 69 yoF with PMH of UC s/p colectomy/colostomy, HTN/HLD, recently  diagnosed bilat PE/DVT on Eliquis  PTA, sent to ED by [facility] for anemia. Was being seen by HemOnc for coag workup and noted to have worsening Hgb, thought possibly d/t recurrent GI bleeding related to UC or ostomy. Pharmacy to dose IV heparin  while Eliquis  on hold.   Prior anticoagulation: [name]; LD 6/24 @ 0730   Significant events: 6/25 EGD No biopsy obtain No UGI bleed Ok with GI to start IV heparin  with no delay following procedure  Today, 05/05/2024: aPTT 146 - supratherapeutic on heparin  700 units/hr (rate confirmed by RN) HL >1.10  - supratherapeutic but may be influenced by recent Eliquis  administration  Hgb 8.0 - falling.  Pltc wnl No bleeding reported   Goal of Therapy: Heparin  level 0.3-0.7 units/ml aPTT 66-102 seconds Monitor platelets by anticoagulation protocol: Yes  Plan: Hold heparin  drip x 1 hour, then restart at 550 units/hr Recheck aPTT at 4 pm Anticipate elevated heparin  (anti-Xa) levels d/t recent Eliquis  Monitor/adjust heparin  per aPTT until heparin  levels correlate w/ aPTT Daily CBC Daily heparin  level once stable Monitor for signs of bleeding or thrombosis Await MD assessment and orders for potential transition back to oral anticoagulation when clinically appropriate  Darina Garret, PharmD, BCPS Clinical Pharmacist 05/05/2024  8:21 AM

## 2024-05-05 NOTE — Progress Notes (Signed)
 Mobility Specialist - Progress Note   05/05/24 0934  Mobility  Activity Ambulated with assistance in hallway  Level of Assistance Modified independent, requires aide device or extra time  Assistive Device Other (Comment) (IV Pole)  Distance Ambulated (ft) 480 ft  Activity Response Tolerated well  Mobility Referral Yes  Mobility visit 1 Mobility  Mobility Specialist Start Time (ACUTE ONLY) [id number]  Mobility Specialist Stop Time (ACUTE ONLY) 0934  Mobility Specialist Time Calculation (min) (ACUTE ONLY) 11 min   Pt received in bed and agreeable to mobility. No complaints during session. Pt to EOB after session with all needs met. RN in room.   [facility]

## 2024-05-05 NOTE — Discharge Summary (Signed)
 Physician Discharge Summary  Latasha Wood FMW:[id number] DOB: 08-Oct-1956 DOA: 05/02/2024  PCP: Joshua Debby CROME, MD  Admit date: 05/02/2024 Discharge date: 05/05/2024  Admitted From: Home Disposition:  Home  Recommendations for Outpatient Follow-up:  Follow up with PCP in 1-2 weeks Follow up with heme/onc as scheduled  Home Health: None Equipment/Devices: None  Discharge Condition:Stable  CODE STATUS:Full  Diet recommendation: As tolerated low salt low fat diet    Brief/Interim Summary: Latasha Wood is a 68 y.o. female with medical history significant of  Anxiety, Anemia, Ulcerative Colitis s/p ileostomy, HLD, HTN, SOB, DMII who presented to emergency department due to acute anemia without overt GI bleeding.  Admission was recommended by hematology/oncology.  Hospitalist called for admission.     Discharge Diagnoses:  Principal Problem:   Anemia Active Problems:   Acute deep vein thrombosis (DVT) of both lower extremities (HCC)   Acute GI bleeding   Melena  Acute anemia, unspecified Cannot rule out GI bleed - Hemoglobin 6.8 at intake, 1 unit PRBC transfusion completed - Upper endoscopy unremarkable for stigmata of bleeding - Off heparin  drip - on eliquis  - Hgb remains stable over past 24h   Hypokalemia - Continue low dose potassium - recheck labs in the next 1-2 weeks   Subacute PE, stable  - Currently considered unprovoked at this time, patient does have notable thyroid  nodule/mass that needs outpatient imaging and workup, pending these findings this may be considered provoked if evaluation was revealing of neoplastic process - Continue eliquis    Thyroid  mass, unspecified, right - Right sided - noted previously nontoxic single nodule - follow up outpatient as scheduled - Unclear if this will need new imaging/uptake study/biopsy   Generalized anxiety- continue xanax , no changes  Discharge Instructions  Discharge Instructions     Call MD for:   difficulty breathing, headache or visual disturbances   Complete by: As directed    Call MD for:  extreme fatigue   Complete by: As directed    Call MD for:  hives   Complete by: As directed    Call MD for:  persistant dizziness or light-headedness   Complete by: As directed    Call MD for:  persistant nausea and vomiting   Complete by: As directed    Call MD for:  severe uncontrolled pain   Complete by: As directed    Call MD for:  temperature >100.4   Complete by: As directed    Diet - low sodium heart healthy   Complete by: As directed    Increase activity slowly   Complete by: As directed       Allergies as of 05/05/2024       Reactions   Betadine  [povidone Iodine ] Rash, Other (See Comments)   The skin is very sensitive   Latex Rash, Other (See Comments)   Redness of skin   Tape Rash, Other (See Comments)   Redness of skin OK with hypofix tape and paper tape! NO transpore tape adhesive tape!        Medication List     TAKE these medications    ALPRAZolam  0.25 MG tablet Commonly known as: XANAX  Take 1 tablet (0.25 mg total) by mouth 2 (two) times daily as needed for anxiety.   apixaban  5 MG Tabs tablet Commonly known as: ELIQUIS  Take 1 tablet (5 mg total) by mouth 2 (two) times daily.   cholecalciferol 25 MCG (1000 UNIT) tablet Commonly known as: VITAMIN D3 Take 1,000 Units by mouth daily.  hydrocortisone  2.5 % cream Apply topically 2 (two) times daily. What changed:  how much to take when to take this reasons to take this   indapamide  1.25 MG tablet Commonly known as: LOZOL  Take 1 tablet (1.25 mg total) by mouth daily.   losartan  25 MG tablet Commonly known as: COZAAR  Take 2 tablets (50 mg total) by mouth at bedtime.   multivitamin with minerals Tabs tablet Take 1 tablet by mouth daily with breakfast.   nystatin cream Commonly known as: MYCOSTATIN Apply 1 Application topically 2 (two) times daily. What changed:  when to take  this additional instructions   pantoprazole  40 MG tablet Commonly known as: Protonix  Take 1 tablet (40 mg total) by mouth daily.   potassium chloride  10 MEQ tablet Commonly known as: Klor-Con  10 Take 1 tablet (10 mEq total) by mouth daily. What changed: when to take this        Allergies  Allergen Reactions   Betadine  [Povidone Iodine ] Rash and Other (See Comments)    The skin is very sensitive   Latex Rash and Other (See Comments)    Redness of skin   Tape Rash and Other (See Comments)    Redness of skin OK with hypofix tape and paper tape! NO transpore tape adhesive tape!    Consultations: GI   Procedures/Studies: CT ANGIO GI BLEED Result Date: 05/04/2024 CLINICAL DATA:  Remote total colectomy for ulcerative colitis, presents with lower GI bleed. Currently taking heparin  due 2 recently diagnosed PE on a CTA chest from 04/20/2024. EXAM: CTA ABDOMEN AND PELVIS WITHOUT AND WITH CONTRAST TECHNIQUE: Multidetector CT imaging of the abdomen and pelvis was performed using the standard protocol during bolus administration of intravenous contrast. Multiplanar reconstructed images and MIPs were obtained and reviewed to evaluate the vascular anatomy. RADIATION DOSE REDUCTION: This exam was performed according to the departmental dose-optimization program which includes automated exposure control, adjustment of the mA and/or kV according to patient size and/or use of iterative reconstruction technique. CONTRAST:  OMNIPAQUE  IOHEXOL  350 MG/ML SOLN COMPARISON:  CTA chest 04/20/2024, CTs of abdomen and pelvis with IV contrast 01/15/2020 and 08/01/2018. FINDINGS: VASCULAR Aorta: Normal caliber aorta without aneurysm, dissection, vasculitis or significant stenosis. There are mild-to-moderate patchy calcific plaques. Celiac: Patent without evidence of aneurysm, dissection, vasculitis or significant stenosis. SMA: Patent without evidence of aneurysm, dissection, vasculitis or significant  stenosis. Renals: Both renal arteries are patent without evidence of aneurysm, dissection, vasculitis, fibromuscular dysplasia or significant stenosis. IMA: Patent without evidence of aneurysm, dissection, vasculitis or significant stenosis. Inflow: Patent without evidence of aneurysm, dissection, vasculitis or significant stenosis. There are mild patchy nonstenosing calcifications in the internal iliac arteries. The common iliac and external iliac arteries are plaque free. Proximal Outflow: Bilateral common femoral and visualized portions of the superficial and profunda femoral arteries are patent without evidence of aneurysm, dissection, vasculitis or significant stenosis. Veins: Patent. Review of the MIP images confirms the above findings. NON-VASCULAR Lower chest: Still seen is partially occlusive thrombus 2 right lower lobe infrahilar segmental arteries, but it is no longer seen in the downstream small vessels below this. Thrombus is also no longer seen in the left lower lobe medial basal subsegmental arteries. The heart is not enlarged. There is mild posterior atelectasis in the lower lobes. Chronic elevation right hemidiaphragm. Lung bases are clear of infiltrates. Hepatobiliary: Subcentimeter chronic cyst in segment 3. Otherwise unremarkable liver. Unremarkable gallbladder and bile ducts. Pancreas: No abnormality. Spleen: No abnormality. Adrenals/Urinary Tract: 2.1 cm Bosniak 1 posterior  upper right renal cyst, Hounsfield density is 19. Stable subcentimeter Bosniak 2 cyst in the superior pole left kidney is too small to characterize. There is no adrenal mass. There is no renal mass enhancement, stones or hydronephrosis. Unremarkable bladder. Stomach/Bowel: There is no appreciable arterial or venous phase contrast extravasation into the bowel. Postsurgical changes of a total colectomy with right lower quadrant end ileostomy are again noted. There is no evidence of a small-bowel obstruction. A surgically  widened segment in the pelvis above the bladder is unchanged in appearance. The stomach is contracted with no focal abnormality. No focal mesenteric inflammatory changes are seen. Lymphatic: No lymphadenopathy is seen. Reproductive: Status post hysterectomy. No adnexal masses. Other: Small volume of pelvic ascites is chronically noted. A small low anterior pelvic wall midline hernia containing a short nonobstructed small bowel loop is again noted. There is no incarcerated hernia. There is no free hemorrhage or free air. Musculoskeletal: There are degenerative changes of the lumbar spine and hips. Advanced facet hypertrophy L4-5 and L5-S1, degenerative discs lower thoracic spine. No acute or other significant osseous findings. IMPRESSION: 1. No evidence of arterial or venous phase contrast extravasation into the bowel to suggest active GI bleeding. 2. Aortic and iliac atherosclerosis. 3. Persistent partially occlusive thrombus in 2 right lower lobe infrahilar segmental arteries, but no longer seen in the downstream small vessels below this. 4. Non-vascular. No acute CT findings in the abdomen or pelvis. 5. Stable small volume of pelvic ascites. 6. Stable small low anterior pelvic wall midline hernia containing a short nonobstructed small bowel loop. 7. Bosniak 1 and 2 renal cysts. 8. Degenerative changes of the spine and hips. Aortic Atherosclerosis (ICD10-I70.0). Electronically Signed   By: Francis Quam M.D.   On: 05/04/2024 03:56   ECHOCARDIOGRAM COMPLETE Result Date: 04/22/2024    ECHOCARDIOGRAM REPORT   Patient Name:   JANESA DOCKERY Date of Exam: 04/22/2024 Medical Rec #:  [id number]           Height:       63.5 in Accession #:    [id number]          Weight:       163.0 lb Date of Birth:  11-18-1955            BSA:          1.783 m Patient Age:    68 years            BP:           101/63 mmHg Patient Gender: F                   HR:           72 bpm. Exam Location:  Inpatient Procedure: 2D Echo, 3D Echo,  Cardiac Doppler, Color Doppler and Strain Analysis            (Both Spectral and Color Flow Doppler were utilized during            procedure). Indications:    Pulmonary Embolus  History:        Patient has prior history of Echocardiogram examinations, most                 recent 08/01/2015. Pulmonary Embolus, Signs/Symptoms:Shortness of                 Breath; Risk Factors:Dyslipidemia and Diabetes.  Sonographer:    Logan Shove RDCS Referring Phys: [phone] [facility] POKHREL IMPRESSIONS  1. Left  ventricular ejection fraction, by estimation, is 65 to 70%. Left ventricular ejection fraction by 3D volume is 66 %. The left ventricle has normal function. The left ventricle has no regional wall motion abnormalities. Left ventricular diastolic  parameters are consistent with Grade I diastolic dysfunction (impaired relaxation). The average left ventricular global longitudinal strain is -19.4 %. The global longitudinal strain is normal.  2. Right ventricular systolic function is normal. The right ventricular size is normal.  3. The mitral valve is normal in structure. No evidence of mitral valve regurgitation. No evidence of mitral stenosis.  4. The aortic valve was not well visualized. Aortic valve regurgitation is not visualized.  5. The inferior vena cava is normal in size with greater than 50% respiratory variability, suggesting right atrial pressure of 3 mmHg. Comparison(s): Unable to view 2016 study for comparison. FINDINGS  Left Ventricle: Left ventricular ejection fraction, by estimation, is 65 to 70%. Left ventricular ejection fraction by 3D volume is 66 %. The left ventricle has normal function. The left ventricle has no regional wall motion abnormalities. The average left ventricular global longitudinal strain is -19.4 %. Strain was performed and the global longitudinal strain is normal. The left ventricular internal cavity size was normal in size. There is no left ventricular hypertrophy. Left ventricular diastolic  parameters are consistent with Grade I diastolic dysfunction (impaired relaxation). Right Ventricle: The right ventricular size is normal. No increase in right ventricular wall thickness. Right ventricular systolic function is normal. Left Atrium: Left atrial size was normal in size. Right Atrium: Right atrial size was normal in size. Pericardium: Trivial pericardial effusion is present. Mitral Valve: The mitral valve is normal in structure. No evidence of mitral valve regurgitation. No evidence of mitral valve stenosis. Tricuspid Valve: The tricuspid valve is normal in structure. Tricuspid valve regurgitation is trivial. No evidence of tricuspid stenosis. Aortic Valve: The aortic valve was not well visualized. Aortic valve regurgitation is not visualized. Aortic valve mean gradient measures 4.0 mmHg. Aortic valve peak gradient measures 8.8 mmHg. Aortic valve area, by VTI measures 2.21 cm. Pulmonic Valve: The pulmonic valve was normal in structure. Pulmonic valve regurgitation is not visualized. No evidence of pulmonic stenosis. Aorta: The aortic root and ascending aorta are structurally normal, with no evidence of dilitation. Venous: The inferior vena cava is normal in size with greater than 50% respiratory variability, suggesting right atrial pressure of 3 mmHg. IAS/Shunts: The atrial septum is grossly normal. Additional Comments: 3D was performed not requiring image post processing on an independent workstation and was normal.  LEFT VENTRICLE PLAX 2D LVIDd:         3.90 cm         Diastology LVIDs:         1.60 cm         LV e' medial:    8.05 cm/s LV PW:         1.00 cm         LV E/e' medial:  8.8 LV IVS:        0.70 cm         LV e' lateral:   8.81 cm/s LVOT diam:     1.80 cm         LV E/e' lateral: 8.0 LV SV:         63 LV SV Index:   35              2D Longitudinal LVOT Area:     2.54 cm  Strain                                2D Strain GLS   -19.4 %                                Avg:                                  3D Volume EF                                LV 3D EF:    Left                                             ventricul                                             ar                                             ejection                                             fraction                                             by 3D                                             volume is                                             66 %.                                 3D Volume EF:                                3D EF:        66 %                                LV EDV:       87 ml  LV ESV:       30 ml                                LV SV:        57 ml RIGHT VENTRICLE             IVC RV Basal diam:  3.40 cm     IVC diam: 1.10 cm RV S prime:     15.80 cm/s TAPSE (M-mode): 2.6 cm LEFT ATRIUM             Index        RIGHT ATRIUM           Index LA diam:        2.70 cm 1.51 cm/m   RA Area:     16.20 cm LA Vol (A2C):   32.2 ml 18.06 ml/m  RA Volume:   36.70 ml  20.58 ml/m LA Vol (A4C):   31.6 ml 17.72 ml/m LA Biplane Vol: 31.9 ml 17.89 ml/m  AORTIC VALVE AV Area (Vmax):    2.20 cm AV Area (Vmean):   2.11 cm AV Area (VTI):     2.21 cm AV Vmax:           148.00 cm/s AV Vmean:          93.500 cm/s AV VTI:            0.285 m AV Peak Grad:      8.8 mmHg AV Mean Grad:      4.0 mmHg LVOT Vmax:         128.00 cm/s LVOT Vmean:        77.700 cm/s LVOT VTI:          0.248 m LVOT/AV VTI ratio: 0.87  AORTA Ao Root diam: 2.80 cm Ao Asc diam:  2.80 cm MITRAL VALVE               TRICUSPID VALVE MV Area (PHT): 2.50 cm    TR Peak grad:   19.4 mmHg MV Decel Time: 304 msec    TR Vmax:        220.00 cm/s MV E velocity: 70.60 cm/s MV A velocity: 94.60 cm/s  SHUNTS MV E/A ratio:  0.75        Systemic VTI:  0.25 m                            Systemic Diam: 1.80 cm Stanly Leavens MD Electronically signed by Stanly Leavens MD Signature Date/Time: 04/22/2024/1:19:43 PM    Final    VAS US  LOWER  EXTREMITY VENOUS (DVT) Result Date: 04/22/2024  Lower Venous DVT Study Patient Name:  JAQUITA BESSIRE  Date of Exam:   04/21/2024 Medical Rec #: [id number]            Accession #:    [id number] Date of Birth: May 13, 1956             Patient Gender: F Patient Age:   65 years Exam Location:  [facility] Procedure:      VAS US  LOWER EXTREMITY VENOUS (DVT) Referring Phys: VERNAL POKHREL --------------------------------------------------------------------------------  Indications: Pulmonary embolism.  Performing Technologist: Alberta Lis RVS  Examination Guidelines: A complete evaluation includes B-mode imaging, spectral Doppler, color Doppler, and power Doppler as needed of all accessible portions of each vessel. Bilateral testing is considered an integral part  of a complete examination. Limited examinations for reoccurring indications may be performed as noted. The reflux portion of the exam is performed with the patient in reverse Trendelenburg.  +---------+---------------+---------+-----------+----------+--------------+ RIGHT    CompressibilityPhasicitySpontaneityPropertiesThrombus Aging +---------+---------------+---------+-----------+----------+--------------+ CFV      Full           Yes      Yes                                 +---------+---------------+---------+-----------+----------+--------------+ SFJ      Full                                                        +---------+---------------+---------+-----------+----------+--------------+ FV Prox  Full                                                        +---------+---------------+---------+-----------+----------+--------------+ FV Mid   Full                                                        +---------+---------------+---------+-----------+----------+--------------+ FV DistalFull                                                         +---------+---------------+---------+-----------+----------+--------------+ PFV      Full                                                        +---------+---------------+---------+-----------+----------+--------------+ POP      Full           Yes      Yes                                 +---------+---------------+---------+-----------+----------+--------------+ PTV      Full                                                        +---------+---------------+---------+-----------+----------+--------------+ PERO     None                                         Acute          +---------+---------------+---------+-----------+----------+--------------+   +---------+---------------+---------+-----------+----------+--------------+ LEFT     CompressibilityPhasicitySpontaneityPropertiesThrombus Aging +---------+---------------+---------+-----------+----------+--------------+ CFV      Full  Yes      Yes                                 +---------+---------------+---------+-----------+----------+--------------+ SFJ      Full                                                        +---------+---------------+---------+-----------+----------+--------------+ FV Prox  Partial                                      Acute          +---------+---------------+---------+-----------+----------+--------------+ FV Mid   Full                                                        +---------+---------------+---------+-----------+----------+--------------+ FV DistalFull                                         Acute          +---------+---------------+---------+-----------+----------+--------------+ PFV      Partial                                                     +---------+---------------+---------+-----------+----------+--------------+ POP      Full           Yes      Yes                                  +---------+---------------+---------+-----------+----------+--------------+ PTV      Full                                                        +---------+---------------+---------+-----------+----------+--------------+ PERO     Full                                                        +---------+---------------+---------+-----------+----------+--------------+     Summary: RIGHT: - Findings consistent with acute deep vein thrombosis involving the right peroneal veins.  - No cystic structure found in the popliteal fossa.  LEFT: - Findings consistent with acute, non occlusive DVT noted in the left proximal femoral vein and profunda vein.  - No cystic structure found in the popliteal fossa.  *See table(s) above for measurements and observations. Electronically signed by Gaile New MD on 04/22/2024 at 10:57:40 AM.    Final    CT Angio Chest  PE W and/or Wo Contrast Result Date: 04/20/2024 CLINICAL DATA:  Shortness of breath EXAM: CT ANGIOGRAPHY CHEST WITH CONTRAST TECHNIQUE: Multidetector CT imaging of the chest was performed using the standard protocol during bolus administration of intravenous contrast. Multiplanar CT image reconstructions and MIPs were obtained to evaluate the vascular anatomy. RADIATION DOSE REDUCTION: This exam was performed according to the departmental dose-optimization program which includes automated exposure control, adjustment of the mA and/or kV according to patient size and/or use of iterative reconstruction technique. CONTRAST:  75mL OMNIPAQUE  IOHEXOL  350 MG/ML SOLN COMPARISON:  Chest x-ray 10/20/2022, CT chest 06/24/2011 FINDINGS: Cardiovascular: Satisfactory opacification of the pulmonary arteries to the segmental level. Positive for acute bilateral pulmonary emboli. Small volume thrombus within right middle lobe segmental vessels. Positive for thrombus in right lower lobar, segmental and subsegmental vessels. Small volume thrombus within left upper lobe segmental  and subsegmental vessels. Positive for thrombus within left lower lobar, segmental and subsegmental vessels. No evidence for right heart strain. RV LV ratio is 0.82. Nonaneurysmal aorta.  Normal cardiac size.  No pericardial effusion Mediastinum/Nodes: Patent trachea. Right thyroid  mass measuring 4 cm. Esophagus within normal limits. No suspicious lymph nodes Lungs/Pleura: No pleural effusion or pneumothorax. Dependent atelectasis Upper Abdomen: No acute finding in the upper abdomen Musculoskeletal: No acute osseous abnormality. Multilevel degenerative change Review of the MIP images confirms the above findings. IMPRESSION: 1. Positive for acute bilateral pulmonary emboli. No CT evidence for right heart strain. 2. 4 cm right thyroid  mass. Recommend thyroid  US  (ref: J Am Coll Radiol. 2015 Feb;12(2): 143-50).This should be performed on a nonemergent basis. Critical Value/emergent results were called by telephone at the time of interpretation on 04/20/2024 at 11:55 pm to provider Dr. Ilah, Who verbally acknowledged these results. Electronically Signed   By: Luke Bun M.D.   On: 04/20/2024 23:56     Subjective: No acute issues/events overnight   Discharge Exam: Vitals:   05/04/24 2122 05/05/24 0527  BP: 111/60 109/71  Pulse: 80 (!) 59  Resp: 20 20  Temp: 99 F (37.2 C) 98 F (36.7 C)  SpO2: 98% 99%   Vitals:   05/04/24 0502 05/04/24 1424 05/04/24 2122 05/05/24 0527  BP: 110/68 108/60 111/60 109/71  Pulse: 61 93 80 (!) 59  Resp: 16 (!) 22 20 20   Temp: (!) 97.5 F (36.4 C) 99 F (37.2 C) 99 F (37.2 C) 98 F (36.7 C)  TempSrc: Axillary Oral Oral Oral  SpO2: 99% 100% 98% 99%  Weight:      Height:        General: Pt is alert, awake, not in acute distress Cardiovascular: RRR, S1/S2 +, no rubs, no gallops Respiratory: CTA bilaterally, no wheezing, no rhonchi Abdominal: Soft, NT, ND, bowel sounds + Extremities: no edema, no cyanosis    The results of significant diagnostics from  this hospitalization (including imaging, microbiology, ancillary and laboratory) are listed below for reference.     Microbiology: No results found for this or any previous visit (from the past 240 hours).   Labs: BNP (last 3 results) No results for input(s): BNP in the last 8760 hours. Basic Metabolic Panel: Recent Labs  Lab 05/02/24 0950 05/02/24 1931 05/03/24 0045  NA 139 136 136  K 3.2* 2.7* 3.5  CL 103 98 99  CO2 30 28 27   GLUCOSE 106* 89 104*  BUN 22 24* 20  CREATININE 1.08* 1.17* 0.93  CALCIUM  9.2 9.0 9.1  MG  --  2.2  --   PHOS  --  4.2  --    Liver Function Tests: Recent Labs  Lab 05/02/24 0950 05/02/24 1931  AST 15 21  ALT 10 15  ALKPHOS 61 64  BILITOT 0.4 0.6  PROT 6.7 6.5  ALBUMIN  3.6 3.2*   No results for input(s): LIPASE, AMYLASE in the last 168 hours. No results for input(s): AMMONIA in the last 168 hours. CBC: Recent Labs  Lab 05/02/24 0950 05/02/24 1931 05/03/24 0543 05/03/24 1419 05/03/24 2156 05/04/24 0138 05/04/24 1349 05/05/24 0538 05/05/24 1627  WBC 9.5 9.9   < > 7.0 9.5 9.6 15.4* 12.7*  --   NEUTROABS 6.2 6.2  --   --   --   --   --   --   --   HGB 7.5* 7.0*   < > 8.3* 9.2* 9.4* 8.9* 8.0* 9.3*  HCT 22.0* 22.2*   < > 25.4* 28.2* 29.1* 27.0* 25.2* 29.1*  MCV 85.9 92.5   < > 89.1 89.0 90.9 89.4 92.6  --   PLT 379 356   < > 327 349 353 372 332  --    < > = values in this interval not displayed.   Cardiac Enzymes: No results for input(s): CKTOTAL, CKMB, CKMBINDEX, TROPONINI in the last 168 hours. BNP: Invalid input(s): POCBNP CBG: No results for input(s): GLUCAP in the last 168 hours. D-Dimer No results for input(s): DDIMER in the last 72 hours. Hgb A1c No results for input(s): HGBA1C in the last 72 hours. Lipid Profile No results for input(s): CHOL, HDL, LDLCALC, TRIG, CHOLHDL, LDLDIRECT in the last 72 hours. Thyroid  function studies No results for input(s): TSH, T4TOTAL, T3FREE,  THYROIDAB in the last 72 hours.  Invalid input(s): FREET3 Anemia work up Recent Labs    05/02/24 1931  VITAMINB12 397  FOLATE 18.2  FERRITIN 30  TIBC 378  IRON 41  RETICCTPCT 4.6*   Urinalysis    Component Value Date/Time   COLORURINE YELLOW 03/06/2024 1020   APPEARANCEUR CLEAR 03/06/2024 1020   LABSPEC 1.015 03/06/2024 1020   PHURINE 6.0 03/06/2024 1020   GLUCOSEU NEGATIVE 03/06/2024 1020   HGBUR NEGATIVE 03/06/2024 1020   BILIRUBINUR NEGATIVE 03/06/2024 1020   KETONESUR NEGATIVE 03/06/2024 1020   PROTEINUR NEGATIVE 01/15/2020 0100   UROBILINOGEN 0.2 03/06/2024 1020   NITRITE NEGATIVE 03/06/2024 1020   LEUKOCYTESUR NEGATIVE 03/06/2024 1020   Sepsis Labs Recent Labs  Lab 05/03/24 2156 05/04/24 0138 05/04/24 1349 05/05/24 0538  WBC 9.5 9.6 15.4* 12.7*   Microbiology No results found for this or any previous visit (from the past 240 hours).   Time coordinating discharge: Over 30 minutes  SIGNED:   Elsie JAYSON [place], DO [facility] 05/05/2024, 5:56 PM Pager   If 7PM-7AM, please contact night-coverage [email or web address]

## 2024-05-07 ENCOUNTER — Other Ambulatory Visit: Payer: Self-pay | Admitting: Internal Medicine

## 2024-05-07 DIAGNOSIS — I10 Essential (primary) hypertension: Secondary | ICD-10-CM

## 2024-05-08 ENCOUNTER — Telehealth: Payer: Self-pay

## 2024-05-08 NOTE — Transitions of Care (Post Inpatient/ED Visit) (Unsigned)
   05/08/2024  Name: Latasha Wood MRN: [id number] DOB: 04/09/1956  Today's TOC FU Call Status: Today's TOC FU Call Status:: Unsuccessful Call (1st Attempt) Unsuccessful Call (1st Attempt) Date: 05/08/24  Attempted to reach the patient regarding the most recent Inpatient/ED visit.  Follow Up Plan: Additional outreach attempts will be made to reach the patient to complete the Transitions of Care (Post Inpatient/ED visit) call.   Signature Julian Lemmings, LPN [facility] Nurse Health Advisor Direct Dial [phone]

## 2024-05-08 NOTE — Telephone Encounter (Signed)
 Copied from CRM [id number]. Topic: General - Other >> May 08, 2024 12:49 PM Robinson H wrote: Reason for CRM: Patient is returning call to [place], please reach back out thanks.  Kyrie [phone]

## 2024-05-09 ENCOUNTER — Encounter: Payer: Self-pay | Admitting: Internal Medicine

## 2024-05-10 NOTE — Transitions of Care (Post Inpatient/ED Visit) (Unsigned)
 05/10/2024  Name: Latasha Wood MRN: [id number] DOB: 17-Sep-1956  Today's TOC FU Call Status: Today's TOC FU Call Status:: Successful TOC FU Call Completed Unsuccessful Call (1st Attempt) Date: 05/08/24 [facility] FU Call Complete Date: 05/10/24 Patient's Name and Date of Birth confirmed.  Transition Care Management Follow-up Telephone Call Date of Discharge: 05/05/24 Discharge Facility: [facility]) Type of Discharge: Inpatient Admission Primary Inpatient Discharge Diagnosis:: GI bleed How have you been since you were released from the hospital?: Better Any questions or concerns?: No  Items Reviewed: Did you receive and understand the discharge instructions provided?: Yes Medications obtained,verified, and reconciled?: Yes (Medications Reviewed) Any new allergies since your discharge?: No Dietary orders reviewed?: Yes People in Home [RPT]: spouse  Medications Reviewed Today: Medications Reviewed Today     Reviewed by Emmitt Pan, LPN (Licensed Practical Nurse) on 05/10/24 at 1451  Med List Status: <None>   Medication Order Taking? Sig Documenting Provider Last Dose Status Informant  ALPRAZolam  (XANAX ) 0.25 MG tablet [id number] Yes Take 1 tablet (0.25 mg total) by mouth 2 (two) times daily as needed for anxiety. Joshua Debby CROME, MD  Active Self  apixaban  (ELIQUIS ) 5 MG TABS tablet [id number] Yes Take 1 tablet (5 mg total) by mouth 2 (two) times daily. Thayil, Irene T, PA-C  Active Self  cholecalciferol (VITAMIN D3) 25 MCG (1000 UNIT) tablet [id number] Yes Take 1,000 Units by mouth daily. [provider]  Active Self  hydrocortisone  2.5 % cream [id number] Yes Apply topically 2 (two) times daily.  Patient taking differently: Apply 1 Application topically 2 (two) times daily as needed (for irritation).   Joshua Debby CROME, MD  Active Self  indapamide  (LOZOL ) 1.25 MG tablet [id number] Yes Take 1 tablet (1.25 mg total) by mouth daily. Joshua Debby CROME, MD  Active Self   losartan  (COZAAR ) 25 MG tablet [id number] Yes Take 2 tablets (50 mg total) by mouth at bedtime. Joshua Debby CROME, MD  Active Self  Multiple Vitamin (MULTIVITAMIN WITH MINERALS) TABS tablet [id number] Yes Take 1 tablet by mouth daily with breakfast. [provider]  Active Self  nystatin cream (MYCOSTATIN) [id number] Yes Apply 1 Application topically 2 (two) times daily.  Patient taking differently: Apply 1 Application topically See admin instructions. Apply to affected area 2 times a day   [place], PA-C  Active Self  pantoprazole  (PROTONIX ) 40 MG tablet [id number] Yes Take 1 tablet (40 mg total) by mouth daily. Lue Elsie JAYSON, MD  Active   potassium chloride  (KLOR-CON  10) 10 MEQ tablet [id number] Yes Take 1 tablet (10 mEq total) by mouth daily. Lue Elsie JAYSON, MD  Active             Home Care and Equipment/Supplies: Were Home Health Services Ordered?: NA Any new equipment or medical supplies ordered?: NA  Functional Questionnaire: Do you need assistance with bathing/showering or dressing?: No Do you need assistance with meal preparation?: No Do you need assistance with eating?: No Do you have difficulty maintaining continence: No Do you need assistance with getting out of bed/getting out of a chair/moving?: No Do you have difficulty managing or taking your medications?: No  Follow up appointments reviewed: PCP Follow-up appointment confirmed?: No (no avail appt , sent message to staff to schedule) MD Provider Line Number:[phone] Given: No Specialist Hospital Follow-up appointment confirmed?: Yes Date of Specialist follow-up appointment?: 05/11/24 Follow-Up Specialty Provider:: cardio Do you need transportation to your follow-up appointment?: No Do you understand care options if your condition(s) worsen?: Yes-patient verbalized  understanding    SIGNATURE Julian Lemmings, LPN [facility] Nurse Health Advisor Direct Dial [phone]

## 2024-05-11 ENCOUNTER — Encounter: Payer: Self-pay | Admitting: Cardiology

## 2024-05-11 ENCOUNTER — Other Ambulatory Visit (HOSPITAL_BASED_OUTPATIENT_CLINIC_OR_DEPARTMENT_OTHER)

## 2024-05-11 ENCOUNTER — Ambulatory Visit: Attending: Cardiology | Admitting: Cardiology

## 2024-05-11 ENCOUNTER — Encounter: Payer: Self-pay | Admitting: Physician Assistant

## 2024-05-11 VITALS — BP 100/70 | HR 72 | Ht 63.0 in | Wt 167.0 lb

## 2024-05-11 DIAGNOSIS — I2699 Other pulmonary embolism without acute cor pulmonale: Secondary | ICD-10-CM

## 2024-05-11 DIAGNOSIS — I82413 Acute embolism and thrombosis of femoral vein, bilateral: Secondary | ICD-10-CM | POA: Diagnosis not present

## 2024-05-11 DIAGNOSIS — I700 Atherosclerosis of aorta: Secondary | ICD-10-CM | POA: Insufficient documentation

## 2024-05-11 DIAGNOSIS — I10 Essential (primary) hypertension: Secondary | ICD-10-CM | POA: Diagnosis not present

## 2024-05-11 NOTE — Progress Notes (Signed)
 Cardiology Office Note:    Date:  05/11/2024   ID:  Latasha Wood, DOB 25-Jul-1956, MRN [id number]  PCP:  Joshua Debby CROME, MD  Cardiologist:  Jennifer JONELLE Crape, MD   Referring MD: Joshua Debby CROME, MD    ASSESSMENT:    1. PE (pulmonary thromboembolism) (HCC)   2. Essential hypertension   3. Acute deep vein thrombosis (DVT) of femoral vein of both lower extremities (HCC)   4. Aortic atherosclerosis (HCC)    PLAN:    In order of problems listed above:  Primary prevention stressed with the patient.  Importance of compliance with diet medication stressed and patient verbalized standing. History of DVT and pulmonary embolism: I discussed this with the patient at extensive length and questions were answered to her satisfaction.  This will be followed by primary care and hematology. She has history of essential hypertension but blood pressure is borderline today.  I told her to keep off her low-dose blood pressure medication and keep a track of blood pressures and follow-up with primary care for this. Aortic atherosclerosis: Patient needs to be on statin therapy and again this will be followed by primary care.  She wishes to start this in the next 3 to 4 weeks. Patient will be seen in follow-up appointment in 9 months or earlier if the patient has any concerns.    Medication Adjustments/Labs and Tests Ordered: Current medicines are reviewed at length with the patient today.  Concerns regarding medicines are outlined above.  No orders of the defined types were placed in this encounter.  No orders of the defined types were placed in this encounter.    Chief Complaint  Patient presents with   Follow-up     History of Present Illness:    Latasha Wood is a 68 y.o. female.  Patient was evaluated by me for dyspnea on exertion.  D-dimer was positive and we sent her for DVT and PE scanning and this was positive.  Patient is now on anticoagulation.  She denies any problems at this  time and takes care of activities of daily living.  No chest pain orthopnea or PND.  No dark stools or any such issues.  At the time of my evaluation, the patient is alert awake oriented and in no distress.  She has aortic atherosclerosis but coronary calcium  score is 0. She Past Medical History:  Diagnosis Date   Anemia    Anxiety    Arthritis    Blood transfusion    Bowel obstruction (HCC)    Colitis, ulcerative (HCC)    Colon polyps    DVT (deep venous thrombosis) (HCC)    Hyperlipidemia    Hypertension    Ileostomy present (HCC)    Kidney disease    Numbness and tingling    hands and feet bilat    Pulmonary embolism (HCC)    Shortness of breath dyspnea    talking or walking    Type 2 diabetes mellitus with complication, without long-term current use of insulin  (HCC) 09/05/2015   currently on no medications, (05/26/2016) pt denies diabetes.  States that she had one time high blood sugars d/t prednisone     Past Surgical History:  Procedure Laterality Date   ABDOMINAL HYSTERECTOMY  2000   ABDOMINAL SURGERY     BREAST CYST ASPIRATION     CATARACT EXTRACTION W/PHACO Right 10/20/2017   Procedure: CATARACT EXTRACTION PHACO AND INTRAOCULAR LENS PLACEMENT (IOC);  Surgeon: Myrna Adine Anes, MD;  Location: [facility];  Service: Ophthalmology;  Laterality: Right;  US  00:50.1 AP% 13.2 CDE 6.61 Fluid Pack lot # [id number]   COLON SURGERY     colostomy   cortisol shot     in left shoulder   DILATION AND CURETTAGE OF UTERUS     most likely after miscarriage   ESOPHAGOGASTRODUODENOSCOPY N/A 05/03/2024   Procedure: EGD (ESOPHAGOGASTRODUODENOSCOPY);  Surgeon: Charlanne Groom, MD;  Location: [facility];  Service: Gastroenterology;  Laterality: N/A;   fibrocystic breast disease     q 6 month mammogram   gravida 6 para 2     all SAB   hyadiform mole     ILEO LOOP DIVERSION N/A 09/25/2015   Procedure: ILEO LOOP COLOSTOMY;  Surgeon: Bernarda Ned, MD;  Location: [facility];  Service: General;   Laterality: N/A;   ILEOSTOMY CLOSURE N/A 10/08/2015   Procedure: ILEOSTOMY REVISION;  Surgeon: Bernarda Ned, MD;  Location: [facility];  Service: General;  Laterality: N/A;   ILEOSTOMY CLOSURE N/A 08/20/2016   Procedure: OPEN RELOCATION OF ILEOSTOMY;  Surgeon: Bernarda Ned, MD;  Location: [facility];  Service: General;  Laterality: N/A;   IR GENERIC HISTORICAL  09/01/2016   IR US  GUIDE VASC ACCESS RIGHT 09/01/2016 Franky Rusk, PA-C [facility]   IR GENERIC HISTORICAL  09/01/2016   IR FLUORO GUIDE CV LINE RIGHT 09/01/2016 Franky Rusk, PA-C [facility]   KNEE ARTHROSCOPY W/ MENISCAL REPAIR     right knee ([place])   LAPAROSCOPIC SMALL BOWEL RESECTION N/A 09/25/2015   Procedure: LAPAROSCOPIC BOWEL RESECTION TIMES TWO;  Surgeon: Bernarda Ned, MD;  Location: [facility];  Service: General;  Laterality: N/A;   ROBOTIC ASSISTED LAPAROSCOPIC LYSIS OF ADHESION N/A 09/25/2015   Procedure: XI ROBOTIC ASSISTED LAPAROSCOPIC LYSIS OF ADHESION;  Surgeon: Bernarda Ned, MD;  Location: [facility];  Service: General;  Laterality: N/A;  90 minutes   Small bowel obstruction      Current Medications: Current Meds  Medication Sig   ALPRAZolam  (XANAX ) 0.25 MG tablet Take 1 tablet (0.25 mg total) by mouth 2 (two) times daily as needed for anxiety.   apixaban  (ELIQUIS ) 5 MG TABS tablet Take 1 tablet (5 mg total) by mouth 2 (two) times daily.   cholecalciferol (VITAMIN D3) 25 MCG (1000 UNIT) tablet Take 1,000 Units by mouth daily.   hydrocortisone  2.5 % cream Apply topically 2 (two) times daily.   indapamide  (LOZOL ) 1.25 MG tablet Take 1 tablet (1.25 mg total) by mouth daily.   Multiple Vitamin (MULTIVITAMIN WITH MINERALS) TABS tablet Take 1 tablet by mouth daily with breakfast.   nystatin cream (MYCOSTATIN) Apply 1 Application topically 2 (two) times daily.   pantoprazole  (PROTONIX ) 40 MG tablet Take 1 tablet (40 mg total) by mouth daily.   potassium chloride  (KLOR-CON  10) 10 MEQ tablet Take 1 tablet (10 mEq total) by  mouth daily.   [DISCONTINUED] losartan  (COZAAR ) 25 MG tablet Take 2 tablets (50 mg total) by mouth at bedtime.     Allergies:   Betadine  [povidone iodine ], Latex, and Tape   Social History   Socioeconomic History   Marital status: Married    Spouse name: Not on file   Number of children: 2   Years of education: 18   Highest education level: Bachelor's degree (e.g., BA, AB, BS)  Occupational History   Occupation: customer relations    Employer: [employer]    Comment: [employer]  Tobacco Use   Smoking status: Never    Passive exposure: Never   Smokeless tobacco: Never  Vaping  Use   Vaping status: Never Used  Substance and Sexual Activity   Alcohol use: Yes    Alcohol/week: 2.0 standard drinks of alcohol    Types: 2 Standard drinks or equivalent per week    Comment: occ   Drug use: No   Sexual activity: Yes    Partners: Male  Other Topics Concern   Not on file  Social History Narrative   HSG, [employer] - BA, [employer] for med tech.  Married '75. 2 dtrs - '79, '82; 1 grandchild. Marriage is in good health. No abuse issues.    Social Drivers of Corporate investment banker Strain: Low Risk  (04/21/2024)   Overall Financial Resource Strain (CARDIA)    Difficulty of Paying Living Expenses: Not very hard  Food Insecurity: No Food Insecurity (05/02/2024)   Hunger Vital Sign    Worried About Running Out of Food in the Last Year: Never true    Ran Out of Food in the Last Year: Never true  Transportation Needs: No Transportation Needs (05/02/2024)   PRAPARE - Administrator, Civil Service (Medical): No    Lack of Transportation (Non-Medical): No  Physical Activity: Insufficiently Active (04/21/2024)   Exercise Vital Sign    Days of Exercise per Week: 2 days    Minutes of Exercise per Session: 30 min  Stress: No Stress Concern Present (04/21/2024)   [employer] of Occupational Health - Occupational Stress Questionnaire    Feeling of Stress: Not at all  Social  Connections: Socially Integrated (05/02/2024)   Social Connection and Isolation Panel    Frequency of Communication with Friends and Family: More than three times a week    Frequency of Social Gatherings with Friends and Family: Once a week    Attends Religious Services: More than 4 times per year    Active Member of [employer] or Organizations: Yes    Attends Engineer, structural: More than 4 times per year    Marital Status: Married  Recent Concern: Social Connections - Moderately Isolated (04/14/2024)   Social Connection and Isolation Panel    Frequency of Communication with Friends and Family: More than three times a week    Frequency of Social Gatherings with Friends and Family: More than three times a week    Attends Religious Services: Never    Database administrator or Organizations: No    Attends Engineer, structural: Never    Marital Status: Married     Family History: The patient's family history includes Breast cancer in her maternal aunt; Breast cancer (age of onset: 63) in her sister; Breast cancer (age of onset: 12) in her mother; Colon cancer in her paternal aunt; Diabetes in her brother, father, maternal grandmother, mother, sister, and sister; Diverticulosis in her brother and sister; HIV in her father; Heart disease in her father; Hyperlipidemia in her daughter; Irritable bowel syndrome in her sister; Throat cancer in her sister.  ROS:   Please see the history of present illness.    All other systems reviewed and are negative.  EKGs/Labs/Other Studies Reviewed:    The following studies were reviewed today: I discussed my findings with the patient at length   Recent Labs: 03/06/2024: TSH 1.00 05/02/2024: ALT 15; Magnesium  2.2 05/03/2024: BUN 20; Creatinine, Ser 0.93; Potassium 3.5; Sodium 136 05/05/2024: Hemoglobin 9.3; Platelets 332  Recent Lipid Panel    Component Value Date/Time   CHOL 171 03/06/2024 1020   CHOL 209 (H) 03/20/2020  1016   TRIG 68.0  03/06/2024 1020   HDL 110.00 03/06/2024 1020   HDL 91 03/20/2020 1016   CHOLHDL 2 03/06/2024 1020   VLDL 13.6 03/06/2024 1020   LDLCALC 48 03/06/2024 1020   LDLCALC 101 (H) 03/20/2020 1016   LDLDIRECT 114.0 04/29/2017 1012    Physical Exam:    VS:  BP 100/70   Pulse 72   Ht 5' 3 (1.6 m)   Wt 167 lb (75.8 kg)   SpO2 97%   BMI 29.58 kg/m     Wt Readings from Last 3 Encounters:  05/11/24 167 lb (75.8 kg)  05/02/24 168 lb (76.2 kg)  05/02/24 168 lb (76.2 kg)     GEN: Patient is in no acute distress HEENT: Normal NECK: No JVD; No carotid bruits LYMPHATICS: No lymphadenopathy CARDIAC: Hear sounds regular, 2/6 systolic murmur at the apex. RESPIRATORY:  Clear to auscultation without rales, wheezing or rhonchi  ABDOMEN: Soft, non-tender, non-distended MUSCULOSKELETAL:  No edema; No deformity  SKIN: Warm and dry NEUROLOGIC:  Alert and oriented x 3 PSYCHIATRIC:  Normal affect   Signed, Jennifer JONELLE Crape, MD  05/11/2024 8:46 AM    [facility]

## 2024-05-11 NOTE — Patient Instructions (Signed)
 Medication Instructions:  STOP Losartan    *If you need a refill on your cardiac medications before your next appointment, please call your pharmacy*  Lab Work: None ordered today. If you have labs (blood work) drawn today and your tests are completely normal, you will receive your results only by: MyChart Message (if you have MyChart) OR A paper copy in the mail If you have any lab test that is abnormal or we need to change your treatment, we will call you to review the results.  Testing/Procedures: None ordered today.  Follow-Up: At [facility], you and your health needs are our priority.  As part of our continuing mission to provide you with exceptional heart care, our providers are all part of one team.  This team includes your primary Cardiologist (physician) and Advanced Practice Providers or APPs (Physician Assistants and Nurse Practitioners) who all work together to provide you with the care you need, when you need it.  Your next appointment:   9 month(s)  Provider:   Jennifer Crape, MD

## 2024-05-13 ENCOUNTER — Other Ambulatory Visit: Payer: Self-pay | Admitting: Internal Medicine

## 2024-05-13 DIAGNOSIS — I10 Essential (primary) hypertension: Secondary | ICD-10-CM

## 2024-05-13 DIAGNOSIS — E876 Hypokalemia: Secondary | ICD-10-CM

## 2024-05-15 ENCOUNTER — Other Ambulatory Visit: Payer: Self-pay | Admitting: Internal Medicine

## 2024-05-15 ENCOUNTER — Other Ambulatory Visit: Payer: Self-pay | Admitting: *Deleted

## 2024-05-15 ENCOUNTER — Telehealth: Payer: Self-pay | Admitting: Hematology and Oncology

## 2024-05-15 ENCOUNTER — Ambulatory Visit: Admitting: Sports Medicine

## 2024-05-15 VITALS — BP 126/82 | HR 78 | Ht 63.0 in | Wt 170.0 lb

## 2024-05-15 DIAGNOSIS — M25562 Pain in left knee: Secondary | ICD-10-CM | POA: Diagnosis not present

## 2024-05-15 DIAGNOSIS — L2084 Intrinsic (allergic) eczema: Secondary | ICD-10-CM

## 2024-05-15 DIAGNOSIS — G8929 Other chronic pain: Secondary | ICD-10-CM

## 2024-05-15 DIAGNOSIS — M170 Bilateral primary osteoarthritis of knee: Secondary | ICD-10-CM | POA: Diagnosis not present

## 2024-05-15 DIAGNOSIS — M25561 Pain in right knee: Secondary | ICD-10-CM

## 2024-05-15 DIAGNOSIS — I10 Essential (primary) hypertension: Secondary | ICD-10-CM

## 2024-05-15 NOTE — Telephone Encounter (Signed)
 Scheduled appointment per patients request via incoming call. Talked with the patient and she is aware of the made appointment.

## 2024-05-15 NOTE — Patient Instructions (Signed)
Knee HEP  As needed follow up

## 2024-05-15 NOTE — Progress Notes (Signed)
 Ben Kaylana Fenstermacher D.[facility] [address] [place] [address] Phone: [phone]   Assessment and Plan:     1. Primary osteoarthritis of both knees 2. Chronic pain of both knees  -Chronic with exacerbation, subsequent visit - Consistent with recurrence of bilateral knee osteoarthritis - Patient has received about 6 months relief from intra-articular CSI in the past.  Elected for repeat CSI today.  Tolerated well per note below.  Patient has increased bleeding risk due to chronic anticoagulation on Eliquis  - Start HEP for knees to decrease pain flares - Use Tylenol  500 to 1000 mg tablets 2-3 times a day for day-to-day pain relief  Procedure: Knee Joint Injection Side: Bilateral Indication: Flare of osteoarthritis  Risks explained and consent was given verbally. The site was cleaned with alcohol prep. A needle was introduced with an anterio-lateral approach. Injection given using 2mL of 1% lidocaine  without epinephrine  and 1mL of kenalog 40mg /ml. This was well tolerated and resulted in symptomatic relief.  Needle was removed, hemostasis achieved, and post injection instructions were explained.  Procedure was repeated on contralateral side.  Pt was advised to call or return to clinic if these symptoms worsen or fail to improve as anticipated.   15 additional minutes spent for educating Therapeutic Home Exercise Program.  This included exercises focusing on stretching, strengthening, with focus on eccentric aspects.   Long term goals include an improvement in range of motion, strength, endurance as well as avoiding reinjury. Patient's frequency would include in 1-2 times a day, 3-5 times a week for a duration of 6-12 weeks. Proper technique shown and discussed handout in great detail with ATC.  All questions were discussed and answered.    Pertinent previous records reviewed include none  Follow Up: As needed if no improvement or worsening of symptoms.   Could consider repeat CSI versus alternative injections.  Could consider physical therapy   Subjective:   I, Moenique Parris, am serving as a Neurosurgeon for Doctor Morene Mace   Chief Complaint: bilateral knee pain    HPI:    08/22/21 Patient is a 68 year old female presenting with bilateral knee pain worse when walking. Patient has had pain in both knees for several years but in the last year it has been worse. Patient locates pain to front on knee cap and the back of her knee feels like a constant aching worse when laying. Patient is a walker and once she gets going the pain will eventually subside. Stopped the meloxicam  since she found out her GFR has dropped.    Radiates: yes down to the shins  Swelling: sometimes Mechanical symptoms: yes  Numbness/tingling: tingling pain that starts lateral R leg above the knee down the leg  Weakness: rarely, but in the right leg  Aggravates: sitting to standing, going up and down steps  Treatments tried: brace, meloxicam , tylenol ,    04/21/2022 Patient states that her knee are hurting real bad wants a CSI    11/11/2022 Patient states is ready for an injection today    03/31/2023 Patient states would like bilateral injection    05/15/2024 Patient states she is ready for injections   Relevant Historical Information: Hypertension, ulcerative colitis with history of resection Additional pertinent review of systems negative.   Current Outpatient Medications:    ALPRAZolam  (XANAX ) 0.25 MG tablet, Take 1 tablet (0.25 mg total) by mouth 2 (two) times daily as needed for anxiety., Disp: 180 tablet, Rfl: 0   apixaban  (ELIQUIS )  5 MG TABS tablet, Take 1 tablet (5 mg total) by mouth 2 (two) times daily., Disp: 360 tablet, Rfl: 0   cholecalciferol (VITAMIN D3) 25 MCG (1000 UNIT) tablet, Take 1,000 Units by mouth daily., Disp: , Rfl:    hydrocortisone  2.5 % cream, Apply topically 2 (two) times daily., Disp: 30 g, Rfl: 3   indapamide  (LOZOL ) 1.25 MG tablet,  Take 1 tablet (1.25 mg total) by mouth daily., Disp: 100 tablet, Rfl: 0   Multiple Vitamin (MULTIVITAMIN WITH MINERALS) TABS tablet, Take 1 tablet by mouth daily with breakfast., Disp: , Rfl:    nystatin  cream (MYCOSTATIN ), Apply 1 Application topically 2 (two) times daily., Disp: 60 g, Rfl: 1   pantoprazole  (PROTONIX ) 40 MG tablet, Take 1 tablet (40 mg total) by mouth daily., Disp: 30 tablet, Rfl: 0   potassium chloride  (KLOR-CON  10) 10 MEQ tablet, Take 1 tablet (10 mEq total) by mouth daily., Disp: 30 tablet, Rfl: 0   Objective:     Vitals:   05/15/24 0830  BP: 126/82  Pulse: 78  SpO2: 98%  Weight: 170 lb (77.1 kg)  Height: 5' 3 (1.6 m)      Body mass index is 30.11 kg/m.    Physical Exam:    General:  awake, alert oriented, no acute distress nontoxic Skin: no suspicious lesions or rashes Neuro:sensation intact, no deficits, strength 5/5 with no deficits, no atrophy, normal muscle tone Psych: No signs of anxiety, depression or other mood disorder   Bilateral knee: No swelling No deformity Neg fluid wave, joint milking Bilateral crepitus, worse on right compared to left Bilateral ROM Flex 100, Ext 10 TTP medial joint line, lateral joint line NTTP over the quad tendon, medial fem condyle, lat fem condyle, patella, plica, patella tendon, tibial tuberostiy, fibular head, posterior fossa, pes anserine bursa, gerdy's tubercle Neg anterior and posterior drawer Neg lachman Neg sag sign Negative varus stress Negative valgus stress Negative McMurray Negative Thessaly   Gait normal     Electronically signed by:  Odis Mace D.[facility] 8:57 AM 05/15/24

## 2024-05-16 ENCOUNTER — Other Ambulatory Visit: Payer: Self-pay

## 2024-05-16 DIAGNOSIS — D649 Anemia, unspecified: Secondary | ICD-10-CM | POA: Diagnosis not present

## 2024-05-16 DIAGNOSIS — E041 Nontoxic single thyroid nodule: Secondary | ICD-10-CM | POA: Diagnosis not present

## 2024-05-16 MED ORDER — PANTOPRAZOLE SODIUM 40 MG PO TBEC
40.0000 mg | DELAYED_RELEASE_TABLET | Freq: Every day | ORAL | 3 refills | Status: AC
Start: 2024-05-16 — End: ?

## 2024-05-18 NOTE — Telephone Encounter (Signed)
 Patient has been scheduled for an earlier appointment and she has been made aware.

## 2024-05-19 DIAGNOSIS — E041 Nontoxic single thyroid nodule: Secondary | ICD-10-CM | POA: Diagnosis not present

## 2024-05-22 ENCOUNTER — Ambulatory Visit (INDEPENDENT_AMBULATORY_CARE_PROVIDER_SITE_OTHER): Admitting: Internal Medicine

## 2024-05-22 ENCOUNTER — Encounter: Payer: Self-pay | Admitting: Internal Medicine

## 2024-05-22 VITALS — BP 126/80 | HR 77 | Temp 98.5°F | Ht 63.0 in | Wt 165.8 lb

## 2024-05-22 DIAGNOSIS — I10 Essential (primary) hypertension: Secondary | ICD-10-CM | POA: Diagnosis not present

## 2024-05-22 DIAGNOSIS — D509 Iron deficiency anemia, unspecified: Secondary | ICD-10-CM | POA: Diagnosis not present

## 2024-05-22 MED ORDER — LOSARTAN POTASSIUM 50 MG PO TABS: 50.0000 mg | ORAL_TABLET | Freq: Every day | ORAL | 1 refills | Status: DC

## 2024-05-22 NOTE — Patient Instructions (Signed)
 Hypertension, Adult High blood pressure (hypertension) is when the force of blood pumping through the arteries is too strong. The arteries are the blood vessels that carry blood from the heart throughout the body. Hypertension forces the heart to work harder to pump blood and may cause arteries to become narrow or stiff. Untreated or uncontrolled hypertension can lead to a heart attack, heart failure, a stroke, kidney disease, and other problems. A blood pressure reading consists of a higher number over a lower number. Ideally, your blood pressure should be below 120/80. The first ("top") number is called the systolic pressure. It is a measure of the pressure in your arteries as your heart beats. The second ("bottom") number is called the diastolic pressure. It is a measure of the pressure in your arteries as the heart relaxes. What are the causes? The exact cause of this condition is not known. There are some conditions that result in high blood pressure. What increases the risk? Certain factors may make you more likely to develop high blood pressure. Some of these risk factors are under your control, including: Smoking. Not getting enough exercise or physical activity. Being overweight. Having too much fat, sugar, calories, or salt (sodium) in your diet. Drinking too much alcohol. Other risk factors include: Having a personal history of heart disease, diabetes, high cholesterol, or kidney disease. Stress. Having a family history of high blood pressure and high cholesterol. Having obstructive sleep apnea. Age. The risk increases with age. What are the signs or symptoms? High blood pressure may not cause symptoms. Very high blood pressure (hypertensive crisis) may cause: Headache. Fast or irregular heartbeats (palpitations). Shortness of breath. Nosebleed. Nausea and vomiting. Vision changes. Severe chest pain, dizziness, and seizures. How is this diagnosed? This condition is diagnosed by  measuring your blood pressure while you are seated, with your arm resting on a flat surface, your legs uncrossed, and your feet flat on the floor. The cuff of the blood pressure monitor will be placed directly against the skin of your upper arm at the level of your heart. Blood pressure should be measured at least twice using the same arm. Certain conditions can cause a difference in blood pressure between your right and left arms. If you have a high blood pressure reading during one visit or you have normal blood pressure with other risk factors, you may be asked to: Return on a different day to have your blood pressure checked again. Monitor your blood pressure at home for 1 week or longer. If you are diagnosed with hypertension, you may have other blood or imaging tests to help your health care provider understand your overall risk for other conditions. How is this treated? This condition is treated by making healthy lifestyle changes, such as eating healthy foods, exercising more, and reducing your alcohol intake. You may be referred for counseling on a healthy diet and physical activity. Your health care provider may prescribe medicine if lifestyle changes are not enough to get your blood pressure under control and if: Your systolic blood pressure is above 130. Your diastolic blood pressure is above 80. Your personal target blood pressure may vary depending on your medical conditions, your age, and other factors. Follow these instructions at home: Eating and drinking  Eat a diet that is high in fiber and potassium, and low in sodium, added sugar, and fat. An example of this eating plan is called the DASH diet. DASH stands for Dietary Approaches to Stop Hypertension. To eat this way: Eat  plenty of fresh fruits and vegetables. Try to fill one half of your plate at each meal with fruits and vegetables. Eat whole grains, such as whole-wheat pasta, brown rice, or whole-grain bread. Fill about one  fourth of your plate with whole grains. Eat or drink low-fat dairy products, such as skim milk or low-fat yogurt. Avoid fatty cuts of meat, processed or cured meats, and poultry with skin. Fill about one fourth of your plate with lean proteins, such as fish, chicken without skin, beans, eggs, or tofu. Avoid pre-made and processed foods. These tend to be higher in sodium, added sugar, and fat. Reduce your daily sodium intake. Many people with hypertension should eat less than 1,500 mg of sodium a day. Do not drink alcohol if: Your health care provider tells you not to drink. You are pregnant, may be pregnant, or are planning to become pregnant. If you drink alcohol: Limit how much you have to: 0-1 drink a day for women. 0-2 drinks a day for men. Know how much alcohol is in your drink. In the [place] one drink equals one 12 oz bottle of beer (355 mL), one 5 oz glass of wine (148 mL), or one 1 oz glass of hard liquor (44 mL). Lifestyle  Work with your health care provider to maintain a healthy body weight or to lose weight. Ask what an ideal weight is for you. Get at least 30 minutes of exercise that causes your heart to beat faster (aerobic exercise) most days of the week. Activities may include walking, swimming, or biking. Include exercise to strengthen your muscles (resistance exercise), such as Pilates or lifting weights, as part of your weekly exercise routine. Try to do these types of exercises for 30 minutes at least 3 days a week. Do not use any products that contain nicotine or tobacco. These products include cigarettes, chewing tobacco, and vaping devices, such as e-cigarettes. If you need help quitting, ask your health care provider. Monitor your blood pressure at home as told by your health care provider. Keep all follow-up visits. This is important. Medicines Take over-the-counter and prescription medicines only as told by your health care provider. Follow directions carefully. Blood  pressure medicines must be taken as prescribed. Do not skip doses of blood pressure medicine. Doing this puts you at risk for problems and can make the medicine less effective. Ask your health care provider about side effects or reactions to medicines that you should watch for. Contact a health care provider if you: Think you are having a reaction to a medicine you are taking. Have headaches that keep coming back (recurring). Feel dizzy. Have swelling in your ankles. Have trouble with your vision. Get help right away if you: Develop a severe headache or confusion. Have unusual weakness or numbness. Feel faint. Have severe pain in your chest or abdomen. Vomit repeatedly. Have trouble breathing. These symptoms may be an emergency. Get help right away. Call 911. Do not wait to see if the symptoms will go away. Do not drive yourself to the hospital. Summary Hypertension is when the force of blood pumping through your arteries is too strong. If this condition is not controlled, it may put you at risk for serious complications. Your personal target blood pressure may vary depending on your medical conditions, your age, and other factors. For most people, a normal blood pressure is less than 120/80. Hypertension is treated with lifestyle changes, medicines, or a combination of both. Lifestyle changes include losing weight, eating a healthy,  low-sodium diet, exercising more, and limiting alcohol. This information is not intended to replace advice given to you by your health care provider. Make sure you discuss any questions you have with your health care provider. Document Revised: 09/02/2021 Document Reviewed: 09/02/2021 Elsevier Patient Education  2024 [employer].

## 2024-05-22 NOTE — Progress Notes (Unsigned)
 Subjective:  Patient ID: Latasha Wood, female    DOB: February 06, 1956  Age: 68 y.o. MRN: [id number]  CC: Anemia   HPI Latasha Wood presents for f/up -----  She has a history of blood clots in both legs and chest, initially presenting with shortness of breath. A D-dimer test was elevated, and a CT scan confirmed the presence of clots. She was started on Eliquis  and later required a blood transfusion due to low hemoglobin. Despite the transfusion, her hemoglobin remains low, and she continues to monitor it closely.  She experiences shortness of breath and congestion since the onset of the blood clots, along with a persistent cough and occasional, non-constant chest pain. She has been evaluated by an ENT for a nodule monitored since 2017, with a biopsy scheduled soon.  She experiences bleeding issues, particularly when changing her stoma. The bleeding is less severe when she does not take Eliquis  immediately before changing the stoma. She is scheduled to see a GI specialist to investigate the source of bleeding.  Her blood pressure management has been adjusted recently. She increased her losartan  dose to 50 mg, but after stopping it briefly, her blood pressure increased, so she resumed the medication. She is currently taking 50 mg of losartan  and 1.25 mg of inderal.  She is not taking an iron supplement currently but is considering it to address her low hemoglobin levels. She is concerned about the potential causes of her blood clots, including cancer or infection.  Outpatient Medications Prior to Visit  Medication Sig Dispense Refill   ALPRAZolam  (XANAX ) 0.25 MG tablet Take 1 tablet (0.25 mg total) by mouth 2 (two) times daily as needed for anxiety. 180 tablet 0   apixaban  (ELIQUIS ) 5 MG TABS tablet Take 1 tablet (5 mg total) by mouth 2 (two) times daily. 360 tablet 0   cholecalciferol (VITAMIN D3) 25 MCG (1000 UNIT) tablet Take 1,000 Units by mouth daily.     hydrocortisone  2.5 %  cream APPLY TOPICALLY TWICE DAILY 120 g 0   indapamide  (LOZOL ) 1.25 MG tablet TAKE 1 TABLET BY MOUTH DAILY 100 tablet 2   Multiple Vitamin (MULTIVITAMIN WITH MINERALS) TABS tablet Take 1 tablet by mouth daily with breakfast.     nystatin  cream (MYCOSTATIN ) Apply 1 Application topically 2 (two) times daily. 60 g 1   pantoprazole  (PROTONIX ) 40 MG tablet Take 1 tablet (40 mg total) by mouth daily. 90 tablet 3   potassium chloride  (KLOR-CON ) 10 MEQ tablet TAKE 1 TABLET BY MOUTH 3 TIMES  DAILY 270 tablet 0   amLODipine  (NORVASC ) 5 MG tablet TAKE 1 TABLET BY MOUTH DAILY 90 tablet 3   losartan  (COZAAR ) 25 MG tablet Take 25 mg by mouth daily. (Patient taking differently: Take 50 mg by mouth daily.)     pantoprazole  (PROTONIX ) 40 MG tablet Take 1 tablet (40 mg total) by mouth daily. 30 tablet 0   No facility-administered medications prior to visit.    ROS Review of Systems  Objective:  BP 126/80 (BP Location: Left Arm, Patient Position: Sitting, Cuff Size: Normal)   Pulse 77   Temp 98.5 F (36.9 C) (Oral)   Ht 5' 3 (1.6 m)   Wt 165 lb 12.8 oz (75.2 kg)   SpO2 98%   BMI 29.37 kg/m   BP Readings from Last 3 Encounters:  05/22/24 126/80  05/15/24 126/82  05/11/24 100/70    Wt Readings from Last 3 Encounters:  05/22/24 165 lb 12.8 oz (75.2 kg)  05/15/24  170 lb (77.1 kg)  05/11/24 167 lb (75.8 kg)    Physical Exam  Lab Results  Component Value Date   WBC 12.7 (H) 05/05/2024   HGB 9.3 (L) 05/05/2024   HCT 29.1 (L) 05/05/2024   PLT 332 05/05/2024   GLUCOSE 104 (H) 05/03/2024   CHOL 171 03/06/2024   TRIG 68.0 03/06/2024   HDL 110.00 03/06/2024   LDLDIRECT 114.0 04/29/2017   LDLCALC 48 03/06/2024   ALT 15 05/02/2024   AST 21 05/02/2024   NA 136 05/03/2024   K 3.5 05/03/2024   CL 99 05/03/2024   CREATININE 0.93 05/03/2024   BUN 20 05/03/2024   CO2 27 05/03/2024   TSH 1.00 03/06/2024   INR 1.1 05/02/2024   HGBA1C 5.6 04/21/2024   MICROALBUR 0.7 03/06/2024    No results  found.  Assessment & Plan:  Iron deficiency anemia, unspecified iron deficiency anemia type -     CBC with Differential/Platelet; Future  Essential hypertension -     Losartan  Potassium; Take 1 tablet (50 mg total) by mouth daily.  Dispense: 90 tablet; Refill: 1     Follow-up: No follow-ups on file.  Debby Molt, MD

## 2024-05-23 ENCOUNTER — Inpatient Hospital Stay

## 2024-05-23 ENCOUNTER — Inpatient Hospital Stay: Attending: Hematology and Oncology | Admitting: Hematology and Oncology

## 2024-05-23 VITALS — BP 117/76 | HR 80 | Temp 97.7°F | Resp 18 | Wt 165.8 lb

## 2024-05-23 DIAGNOSIS — Z79899 Other long term (current) drug therapy: Secondary | ICD-10-CM | POA: Diagnosis not present

## 2024-05-23 DIAGNOSIS — Z86718 Personal history of other venous thrombosis and embolism: Secondary | ICD-10-CM | POA: Insufficient documentation

## 2024-05-23 DIAGNOSIS — Z86711 Personal history of pulmonary embolism: Secondary | ICD-10-CM | POA: Insufficient documentation

## 2024-05-23 DIAGNOSIS — I2699 Other pulmonary embolism without acute cor pulmonale: Secondary | ICD-10-CM

## 2024-05-23 DIAGNOSIS — Z7901 Long term (current) use of anticoagulants: Secondary | ICD-10-CM | POA: Diagnosis not present

## 2024-05-23 DIAGNOSIS — D649 Anemia, unspecified: Secondary | ICD-10-CM | POA: Diagnosis not present

## 2024-05-23 LAB — CMP (CANCER CENTER ONLY)
ALT: 10 U/L (ref 0–44)
AST: 14 U/L — ABNORMAL LOW (ref 15–41)
Albumin: 3.9 g/dL (ref 3.5–5.0)
Alkaline Phosphatase: 69 U/L (ref 38–126)
Anion gap: 5 (ref 5–15)
BUN: 22 mg/dL (ref 8–23)
CO2: 33 mmol/L — ABNORMAL HIGH (ref 22–32)
Calcium: 9.5 mg/dL (ref 8.9–10.3)
Chloride: 101 mmol/L (ref 98–111)
Creatinine: 1.22 mg/dL — ABNORMAL HIGH (ref 0.44–1.00)
GFR, Estimated: 48 mL/min — ABNORMAL LOW (ref >60)
Glucose, Bld: 91 mg/dL (ref 70–99)
Potassium: 3.7 mmol/L (ref 3.5–5.1)
Sodium: 139 mmol/L (ref 135–145)
Total Bilirubin: 0.6 mg/dL (ref 0.0–1.2)
Total Protein: 7.4 g/dL (ref 6.5–8.1)

## 2024-05-23 LAB — CBC WITH DIFFERENTIAL (CANCER CENTER ONLY)
Abs Immature Granulocytes: 0.04 K/uL (ref 0.00–0.07)
Basophils Absolute: 0 K/uL (ref 0.0–0.1)
Basophils Relative: 0 %
Eosinophils Absolute: 0.2 K/uL (ref 0.0–0.5)
Eosinophils Relative: 2 %
HCT: 32.6 % — ABNORMAL LOW (ref 36.0–46.0)
Hemoglobin: 10.7 g/dL — ABNORMAL LOW (ref 12.0–15.0)
Immature Granulocytes: 0 %
Lymphocytes Relative: 23 %
Lymphs Abs: 2.2 K/uL (ref 0.7–4.0)
MCH: 28.2 pg (ref 26.0–34.0)
MCHC: 32.8 g/dL (ref 30.0–36.0)
MCV: 86 fL (ref 80.0–100.0)
Monocytes Absolute: 0.6 K/uL (ref 0.1–1.0)
Monocytes Relative: 6 %
Neutro Abs: 6.4 K/uL (ref 1.7–7.7)
Neutrophils Relative %: 69 %
Platelet Count: 364 K/uL (ref 150–400)
RBC: 3.79 MIL/uL — ABNORMAL LOW (ref 3.87–5.11)
RDW: 13.5 % (ref 11.5–15.5)
WBC Count: 9.5 K/uL (ref 4.0–10.5)
nRBC: 0 % (ref 0.0–0.2)

## 2024-05-23 LAB — SAMPLE TO BLOOD BANK

## 2024-05-23 LAB — IRON AND IRON BINDING CAPACITY (CC-WL,HP ONLY)
Iron: 59 ug/dL (ref 28–170)
Saturation Ratios: 12 % (ref 10.4–31.8)
TIBC: 510 ug/dL — ABNORMAL HIGH (ref 250–450)
UIBC: 451 ug/dL — ABNORMAL HIGH (ref 148–442)

## 2024-05-23 LAB — RETIC PANEL
Immature Retic Fract: 11.6 % (ref 2.3–15.9)
RBC.: 3.68 MIL/uL — ABNORMAL LOW (ref 3.87–5.11)
Retic Count, Absolute: 79.5 K/uL (ref 19.0–186.0)
Retic Ct Pct: 2.2 % (ref 0.4–3.1)
Reticulocyte Hemoglobin: 32.1 pg (ref >27.9)

## 2024-05-23 LAB — FERRITIN: Ferritin: 26 ng/mL (ref 11–307)

## 2024-05-23 NOTE — Progress Notes (Signed)
 [facility] [phone]   [phone]  PROGRESS NOTE  Patient Care Team: Joshua Debby CROME, MD as PCP - General (Internal Medicine) Obie Princella HERO, MD (Inactive) (Gastroenterology) Gorge Ade, MD (Obstetrics and Gynecology) Rik Channel, MD (Ophthalmology) Debby Hila, MD as Consulting Physician (Colon and Rectal Surgery) Merceda Lela SAUNDERS, [facility] (Pharmacist) Enola Feliciano Hugger, MD as Consulting Physician (Ophthalmology)  Hematological/Oncological History # Bilateral Lower Extremity DVT # Bilateral Pulmonary Emboli 04/20/2024: Presented to cardiologist with progressive shortness of breath and dyspnea over the last few months. Labs showed positive D-dimer and was referred to ED for further evaluation.  04/20/2024-04/23/2024:  04/20/2024: CTA chest: Acute bilateral pulmonary emboli without evidence of right heart strain.  04/21/2024: LE Ultrasound: Acute DVT involving the right peroneal veins and left proximal femoral vein and left profunda vein.  Patient was started on heparin  drip and discharged on Eliquis  therapy Hypercoagulable workup was negative.  05/02/2024: Establish care with [facility]   Interval History:  Latasha Wood 68 y.o. female with medical history significant for DVT/PET who presents for a follow up visit. The patient's last visit was on 05/02/2024. In the interim since the last visit she was hospitalized for severe anemia following our last visit.  On exam today Latasha Wood reports she has been well overall in the interim since our last visit.  She reports that she has restarted her blood thinner upon discharge.  She notes that she was seen by her primary doctor yesterday but no labs were drawn because she knew she would get labs drawn today.  She reports that she has had no overt evidence of GI bleeding other than some blood leakage around the site of her ostomy.  She reports that none was found on endoscopy  either.  She reports that she does have consistent issues with sore throat and congestion.  She was having tarry black stools prior to her admission but has not been having any since.  She reports that she does have some occasional bouts of shortness of breath and dizziness but overall is able to perform her day-to-day functions without any difficulty.  She otherwise denies any fevers, chills, sweats.  A full 10 point ROS is otherwise negative.  MEDICAL HISTORY:  Past Medical History:  Diagnosis Date   Anemia    Anxiety    Arthritis    Blood transfusion    Bowel obstruction (HCC)    Colitis, ulcerative (HCC)    Colon polyps    DVT (deep venous thrombosis) (HCC)    Hyperlipidemia    Hypertension    Ileostomy present (HCC)    Kidney disease    Numbness and tingling    hands and feet bilat    Pulmonary embolism (HCC)    Shortness of breath dyspnea    talking or walking    Type 2 diabetes mellitus with complication, without long-term current use of insulin  (HCC) 09/05/2015   currently on no medications, (05/26/2016) pt denies diabetes.  States that she had one time high blood sugars d/t prednisone     SURGICAL HISTORY: Past Surgical History:  Procedure Laterality Date   ABDOMINAL HYSTERECTOMY  2000   ABDOMINAL SURGERY     BREAST CYST ASPIRATION     CATARACT EXTRACTION W/PHACO Right 10/20/2017   Procedure: CATARACT EXTRACTION PHACO AND INTRAOCULAR LENS PLACEMENT (IOC);  Surgeon: Myrna Adine Anes, MD;  Location: [facility];  Service: Ophthalmology;  Laterality: Right;  US  00:50.1 AP% 13.2 CDE 6.61 Fluid Pack lot # [id number]  COLON SURGERY     colostomy   cortisol shot     in left shoulder   DILATION AND CURETTAGE OF UTERUS     most likely after miscarriage   ESOPHAGOGASTRODUODENOSCOPY N/A 05/03/2024   Procedure: EGD (ESOPHAGOGASTRODUODENOSCOPY);  Surgeon: Charlanne Groom, MD;  Location: [facility];  Service: Gastroenterology;  Laterality: N/A;   fibrocystic breast disease     q 6  month mammogram   gravida 6 para 2     all SAB   hyadiform mole     ILEO LOOP DIVERSION N/A 09/25/2015   Procedure: ILEO LOOP COLOSTOMY;  Surgeon: Bernarda Ned, MD;  Location: [facility];  Service: General;  Laterality: N/A;   ILEOSTOMY CLOSURE N/A 10/08/2015   Procedure: ILEOSTOMY REVISION;  Surgeon: Bernarda Ned, MD;  Location: [facility];  Service: General;  Laterality: N/A;   ILEOSTOMY CLOSURE N/A 08/20/2016   Procedure: OPEN RELOCATION OF ILEOSTOMY;  Surgeon: Bernarda Ned, MD;  Location: [facility];  Service: General;  Laterality: N/A;   IR GENERIC HISTORICAL  09/01/2016   IR US  GUIDE VASC ACCESS RIGHT 09/01/2016 Franky Rusk, PA-C [facility]   IR GENERIC HISTORICAL  09/01/2016   IR FLUORO GUIDE CV LINE RIGHT 09/01/2016 Franky Rusk, PA-C [facility]   KNEE ARTHROSCOPY W/ MENISCAL REPAIR     right knee (Daldorf)   LAPAROSCOPIC SMALL BOWEL RESECTION N/A 09/25/2015   Procedure: LAPAROSCOPIC BOWEL RESECTION TIMES TWO;  Surgeon: Bernarda Ned, MD;  Location: [facility];  Service: General;  Laterality: N/A;   ROBOTIC ASSISTED LAPAROSCOPIC LYSIS OF ADHESION N/A 09/25/2015   Procedure: XI ROBOTIC ASSISTED LAPAROSCOPIC LYSIS OF ADHESION;  Surgeon: Bernarda Ned, MD;  Location: [facility];  Service: General;  Laterality: N/A;  90 minutes   Small bowel obstruction      SOCIAL HISTORY: Social History   Socioeconomic History   Marital status: Married    Spouse name: Not on file   Number of children: 2   Years of education: 18   Highest education level: Bachelor's degree (e.g., BA, AB, BS)  Occupational History   Occupation: Dispensing optician: [employer]    Comment: [employer]  Tobacco Use   Smoking status: Never    Passive exposure: Never   Smokeless tobacco: Never  Vaping Use   Vaping status: Never Used  Substance and Sexual Activity   Alcohol use: Yes    Alcohol/week: 2.0 standard drinks of alcohol    Types: 2 Standard drinks or equivalent per week    Comment: occ   Drug use:  No   Sexual activity: Yes    Partners: Male  Other Topics Concern   Not on file  Social History Narrative   HSG, [employer] - BA, [employer] for med tech.  Married '75. 2 dtrs - '79, '82; 1 grandchild. Marriage is in good health. No abuse issues.    Social Drivers of Corporate investment banker Strain: Low Risk  (04/21/2024)   Overall Financial Resource Strain (CARDIA)    Difficulty of Paying Living Expenses: Not very hard  Food Insecurity: No Food Insecurity (05/02/2024)   Hunger Vital Sign    Worried About Running Out of Food in the Last Year: Never true    Ran Out of Food in the Last Year: Never true  Transportation Needs: No Transportation Needs (05/02/2024)   PRAPARE - Administrator, Civil Service (Medical): No    Lack of Transportation (Non-Medical): No  Physical Activity: Insufficiently Active (04/21/2024)  Exercise Vital Sign    Days of Exercise per Week: 2 days    Minutes of Exercise per Session: 30 min  Stress: No Stress Concern Present (04/21/2024)   [employer] of Occupational Health - Occupational Stress Questionnaire    Feeling of Stress: Not at all  Social Connections: Socially Integrated (05/02/2024)   Social Connection and Isolation Panel    Frequency of Communication with Friends and Family: More than three times a week    Frequency of Social Gatherings with Friends and Family: Once a week    Attends Religious Services: More than 4 times per year    Active Member of [employer] or Organizations: Yes    Attends Engineer, structural: More than 4 times per year    Marital Status: Married  Recent Concern: Social Connections - Moderately Isolated (04/14/2024)   Social Connection and Isolation Panel    Frequency of Communication with Friends and Family: More than three times a week    Frequency of Social Gatherings with Friends and Family: More than three times a week    Attends Religious Services: Never    Database administrator or Organizations: No     Attends Banker Meetings: Never    Marital Status: Married  Catering manager Violence: Unknown (05/03/2024)   Humiliation, Afraid, Rape, and Kick questionnaire    Fear of Current or Ex-Partner: Not on file    Emotionally Abused: Not on file    Physically Abused: Not on file    Sexually Abused: No    FAMILY HISTORY: Family History  Problem Relation Age of Onset   Diabetes Mother    Breast cancer Mother 41   Diabetes Father    Heart disease Father    HIV Father    Diabetes Sister    Breast cancer Sister 51   Diverticulosis Sister    Throat cancer Sister    Diabetes Sister    Irritable bowel syndrome Sister    Diabetes Brother    Diverticulosis Brother    Diabetes Maternal Grandmother    Hyperlipidemia Daughter    Breast cancer Maternal Aunt    Colon cancer Paternal Aunt     ALLERGIES:  is allergic to betadine  [povidone iodine ], latex, and tape.  MEDICATIONS:  Current Outpatient Medications  Medication Sig Dispense Refill   ALPRAZolam  (XANAX ) 0.25 MG tablet Take 1 tablet (0.25 mg total) by mouth 2 (two) times daily as needed for anxiety. 180 tablet 0   apixaban  (ELIQUIS ) 5 MG TABS tablet Take 1 tablet (5 mg total) by mouth 2 (two) times daily. 360 tablet 0   cholecalciferol (VITAMIN D3) 25 MCG (1000 UNIT) tablet Take 1,000 Units by mouth daily.     hydrocortisone  2.5 % cream APPLY TOPICALLY TWICE DAILY 120 g 0   indapamide  (LOZOL ) 1.25 MG tablet TAKE 1 TABLET BY MOUTH DAILY 100 tablet 2   losartan  (COZAAR ) 50 MG tablet Take 1 tablet (50 mg total) by mouth daily. 90 tablet 1   Multiple Vitamin (MULTIVITAMIN WITH MINERALS) TABS tablet Take 1 tablet by mouth daily with breakfast.     nystatin  cream (MYCOSTATIN ) Apply 1 Application topically 2 (two) times daily. 60 g 1   pantoprazole  (PROTONIX ) 40 MG tablet Take 1 tablet (40 mg total) by mouth daily. 90 tablet 3   potassium chloride  (KLOR-CON ) 10 MEQ tablet TAKE 1 TABLET BY MOUTH 3 TIMES  DAILY 270 tablet 0    No current facility-administered medications for this visit.  REVIEW OF SYSTEMS:   Constitutional: ( - ) fevers, ( - )  chills , ( - ) night sweats Eyes: ( - ) blurriness of vision, ( - ) double vision, ( - ) watery eyes Ears, nose, mouth, throat, and face: ( - ) mucositis, ( - ) sore throat Respiratory: ( - ) cough, ( - ) dyspnea, ( - ) wheezes Cardiovascular: ( - ) palpitation, ( - ) chest discomfort, ( - ) lower extremity swelling Gastrointestinal:  ( - ) nausea, ( - ) heartburn, ( - ) change in bowel habits Skin: ( - ) abnormal skin rashes Lymphatics: ( - ) new lymphadenopathy, ( - ) easy bruising Neurological: ( - ) numbness, ( - ) tingling, ( - ) new weaknesses Behavioral/Psych: ( - ) mood change, ( - ) new changes  All other systems were reviewed with the patient and are negative.  PHYSICAL EXAMINATION: Vitals:   05/23/24 1020  BP: 117/76  Pulse: 80  Resp: 18  Temp: 97.7 F (36.5 C)  SpO2: 97%   Filed Weights   05/23/24 1020  Weight: 165 lb 12.8 oz (75.2 kg)    GENERAL: Well-appearing elderly African-American female, alert, no distress and comfortable SKIN: skin color, texture, turgor are normal, no rashes or significant lesions EYES: conjunctiva are pink and non-injected, sclera clear LUNGS: clear to auscultation and percussion with normal breathing effort HEART: regular rate & rhythm and no murmurs and no lower extremity edema Musculoskeletal: no cyanosis of digits and no clubbing  PSYCH: alert & oriented x 3, fluent speech NEURO: no focal motor/sensory deficits  LABORATORY DATA:  I have reviewed the data as listed    Latest Ref Rng & Units 05/23/2024   11:04 AM 05/05/2024    4:27 PM 05/05/2024    5:38 AM  CBC  WBC 4.0 - 10.5 K/uL 9.5   12.7   Hemoglobin 12.0 - 15.0 g/dL 89.2  9.3  8.0   Hematocrit 36.0 - 46.0 % 32.6  29.1  25.2   Platelets 150 - 400 K/uL 364   332        Latest Ref Rng & Units 05/23/2024   11:04 AM 05/03/2024   12:45 AM 05/02/2024     7:31 PM  CMP  Glucose 70 - 99 mg/dL 91  895  89   BUN 8 - 23 mg/dL 22  20  24    Creatinine 0.44 - 1.00 mg/dL 8.77  9.06  8.82   Sodium 135 - 145 mmol/L 139  136  136   Potassium 3.5 - 5.1 mmol/L 3.7  3.5  2.7   Chloride 98 - 111 mmol/L 101  99  98   CO2 22 - 32 mmol/L 33  27  28   Calcium  8.9 - 10.3 mg/dL 9.5  9.1  9.0   Total Protein 6.5 - 8.1 g/dL 7.4   6.5   Total Bilirubin 0.0 - 1.2 mg/dL 0.6   0.6   Alkaline Phos 38 - 126 U/L 69   64   AST 15 - 41 U/L 14   21   ALT 0 - 44 U/L 10   15     RADIOGRAPHIC STUDIES: CT ANGIO GI BLEED Result Date: 05/04/2024 CLINICAL DATA:  Remote total colectomy for ulcerative colitis, presents with lower GI bleed. Currently taking heparin  due 2 recently diagnosed PE on a CTA chest from 04/20/2024. EXAM: CTA ABDOMEN AND PELVIS WITHOUT AND WITH CONTRAST TECHNIQUE: Multidetector CT imaging of the abdomen and pelvis  was performed using the standard protocol during bolus administration of intravenous contrast. Multiplanar reconstructed images and MIPs were obtained and reviewed to evaluate the vascular anatomy. RADIATION DOSE REDUCTION: This exam was performed according to the departmental dose-optimization program which includes automated exposure control, adjustment of the mA and/or kV according to patient size and/or use of iterative reconstruction technique. CONTRAST:  OMNIPAQUE  IOHEXOL  350 MG/ML SOLN COMPARISON:  CTA chest 04/20/2024, CTs of abdomen and pelvis with IV contrast 01/15/2020 and 08/01/2018. FINDINGS: VASCULAR Aorta: Normal caliber aorta without aneurysm, dissection, vasculitis or significant stenosis. There are mild-to-moderate patchy calcific plaques. Celiac: Patent without evidence of aneurysm, dissection, vasculitis or significant stenosis. SMA: Patent without evidence of aneurysm, dissection, vasculitis or significant stenosis. Renals: Both renal arteries are patent without evidence of aneurysm, dissection, vasculitis, fibromuscular  dysplasia or significant stenosis. IMA: Patent without evidence of aneurysm, dissection, vasculitis or significant stenosis. Inflow: Patent without evidence of aneurysm, dissection, vasculitis or significant stenosis. There are mild patchy nonstenosing calcifications in the internal iliac arteries. The common iliac and external iliac arteries are plaque free. Proximal Outflow: Bilateral common femoral and visualized portions of the superficial and profunda femoral arteries are patent without evidence of aneurysm, dissection, vasculitis or significant stenosis. Veins: Patent. Review of the MIP images confirms the above findings. NON-VASCULAR Lower chest: Still seen is partially occlusive thrombus 2 right lower lobe infrahilar segmental arteries, but it is no longer seen in the downstream small vessels below this. Thrombus is also no longer seen in the left lower lobe medial basal subsegmental arteries. The heart is not enlarged. There is mild posterior atelectasis in the lower lobes. Chronic elevation right hemidiaphragm. Lung bases are clear of infiltrates. Hepatobiliary: Subcentimeter chronic cyst in segment 3. Otherwise unremarkable liver. Unremarkable gallbladder and bile ducts. Pancreas: No abnormality. Spleen: No abnormality. Adrenals/Urinary Tract: 2.1 cm Bosniak 1 posterior upper right renal cyst, Hounsfield density is 19. Stable subcentimeter Bosniak 2 cyst in the superior pole left kidney is too small to characterize. There is no adrenal mass. There is no renal mass enhancement, stones or hydronephrosis. Unremarkable bladder. Stomach/Bowel: There is no appreciable arterial or venous phase contrast extravasation into the bowel. Postsurgical changes of a total colectomy with right lower quadrant end ileostomy are again noted. There is no evidence of a small-bowel obstruction. A surgically widened segment in the pelvis above the bladder is unchanged in appearance. The stomach is contracted with no focal  abnormality. No focal mesenteric inflammatory changes are seen. Lymphatic: No lymphadenopathy is seen. Reproductive: Status post hysterectomy. No adnexal masses. Other: Small volume of pelvic ascites is chronically noted. A small low anterior pelvic wall midline hernia containing a short nonobstructed small bowel loop is again noted. There is no incarcerated hernia. There is no free hemorrhage or free air. Musculoskeletal: There are degenerative changes of the lumbar spine and hips. Advanced facet hypertrophy L4-5 and L5-S1, degenerative discs lower thoracic spine. No acute or other significant osseous findings. IMPRESSION: 1. No evidence of arterial or venous phase contrast extravasation into the bowel to suggest active GI bleeding. 2. Aortic and iliac atherosclerosis. 3. Persistent partially occlusive thrombus in 2 right lower lobe infrahilar segmental arteries, but no longer seen in the downstream small vessels below this. 4. Non-vascular. No acute CT findings in the abdomen or pelvis. 5. Stable small volume of pelvic ascites. 6. Stable small low anterior pelvic wall midline hernia containing a short nonobstructed small bowel loop. 7. Bosniak 1 and 2 renal cysts. 8. Degenerative changes of  the spine and hips. Aortic Atherosclerosis (ICD10-I70.0). Electronically Signed   By: Francis Quam M.D.   On: 05/04/2024 03:56    ASSESSMENT & PLAN Latasha Wood 68 y.o. female with medical history significant for DVT/PET who presents for a follow up visit.  A provoked venous thromboembolism (VTE) is one that has a clear inciting factor or event. Provoking factors include prolonged travel/immobility, surgery (particular abdominal or orthropedic), trauma,  and pregnancy/ estrogen containing birth control. After a detailed history and review of the records there is no clear provoking factor for this patient's VTE.  Patients with unprovoked VTEs have up to 25% recurrence after 5 years and 36% at 10 years, with 4% of  these clots being fatal (BMJ [phone]). Therefore the formal recommendation for unprovoked VTE's is lifelong anticoagulation, as the cause may not be transient or reversible. We recommend 6 months or full strength anticoagulation with a re-evaluation after that time.  The patient's will then have a choice of maintenance dose DOAC (preferred, recommended), 81mg  ASA PO daily (non-preferred), or no further anticoagulation (not recommended).    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  --recommend the patient continue eliquis  5mg  BID --Labs today show white blood cell 9.5, hemoglobin 10.7, MCV 86, platelets 364.  Iron studies pending. --RTC in 3 months' time with strict return precautions for overt signs of bleeding.  Interval monthly labs due to recent drop in hemoglobin  # Acute Anemia # Suspected GI Bleeding -- Patient was admitted due to dark tarry stools and precipitous drop in hemoglobin.  Endoscopic evaluation did not reveal source of bleeding. --Today we will order iron studies to assure patient has adequate levels of iron stores. --Strict return precautions for any signs or symptoms concerning for worsening bleed. --Will perform monthly labs to continue monitoring closely.   #Right thyroid  mass: --Under the care of [facility] ENT team, last seen by Dr. Murry in April 2025.  --FNA of right thyroid  mass from January 2021 was consistent with benign follicular nodule   No orders of the defined types were placed in this encounter.  Orders Placed This Encounter  Procedures   CBC with Differential (Cancer Center Only)    Standing Status:   Future    Number of Occurrences:   1    Expiration Date:   05/23/2025   CMP (Cancer Center only)    Standing Status:   Future    Number of Occurrences:   1    Expiration Date:   05/23/2025   Ferritin    Standing  Status:   Future    Number of Occurrences:   1    Expiration Date:   05/23/2025   Iron and Iron Binding Capacity ([facility] only)    Standing Status:   Future    Number of Occurrences:   1    Expiration Date:   05/23/2025   Retic Panel    Standing Status:   Future    Number of Occurrences:   1    Expiration Date:   05/23/2025   Sample to Blood Bank    Standing Status:   Future    Number of Occurrences:   1    Expiration Date:   05/23/2025    All questions were answered. The patient knows to call the clinic with any problems, questions or concerns.  A total of more than 30 minutes were  spent on this encounter with face-to-face time and non-face-to-face time, including preparing to see the patient, ordering tests and/or medications, counseling the patient and coordination of care as outlined above.   Norleen IVAR Kidney, MD Department of Hematology/Oncology [facility] Phone: [phone] Pager: [phone] Email: [email or web address]  05/23/2024 12:57 PM

## 2024-05-29 ENCOUNTER — Inpatient Hospital Stay: Admitting: Internal Medicine

## 2024-05-30 DIAGNOSIS — E041 Nontoxic single thyroid nodule: Secondary | ICD-10-CM | POA: Diagnosis not present

## 2024-06-01 ENCOUNTER — Other Ambulatory Visit: Payer: Self-pay | Admitting: *Deleted

## 2024-06-01 DIAGNOSIS — L821 Other seborrheic keratosis: Secondary | ICD-10-CM | POA: Diagnosis not present

## 2024-06-01 DIAGNOSIS — L309 Dermatitis, unspecified: Secondary | ICD-10-CM | POA: Diagnosis not present

## 2024-06-01 DIAGNOSIS — D485 Neoplasm of uncertain behavior of skin: Secondary | ICD-10-CM | POA: Diagnosis not present

## 2024-06-05 ENCOUNTER — Other Ambulatory Visit: Payer: Self-pay | Admitting: *Deleted

## 2024-06-08 ENCOUNTER — Ambulatory Visit: Admitting: Gastroenterology

## 2024-06-08 ENCOUNTER — Encounter: Payer: Self-pay | Admitting: Gastroenterology

## 2024-06-08 ENCOUNTER — Other Ambulatory Visit (INDEPENDENT_AMBULATORY_CARE_PROVIDER_SITE_OTHER)

## 2024-06-08 ENCOUNTER — Ambulatory Visit: Payer: Self-pay | Admitting: Gastroenterology

## 2024-06-08 VITALS — BP 122/72 | HR 79 | Ht 63.0 in | Wt 169.0 lb

## 2024-06-08 DIAGNOSIS — D649 Anemia, unspecified: Secondary | ICD-10-CM | POA: Diagnosis not present

## 2024-06-08 DIAGNOSIS — Z7901 Long term (current) use of anticoagulants: Secondary | ICD-10-CM | POA: Diagnosis not present

## 2024-06-08 DIAGNOSIS — K921 Melena: Secondary | ICD-10-CM

## 2024-06-08 DIAGNOSIS — R21 Rash and other nonspecific skin eruption: Secondary | ICD-10-CM

## 2024-06-08 DIAGNOSIS — D509 Iron deficiency anemia, unspecified: Secondary | ICD-10-CM

## 2024-06-08 DIAGNOSIS — Z932 Ileostomy status: Secondary | ICD-10-CM

## 2024-06-08 LAB — HEMOGLOBIN: Hemoglobin: 10.9 g/dL — ABNORMAL LOW (ref 12.0–15.0)

## 2024-06-08 NOTE — Progress Notes (Signed)
 HPI :  68 year old female here for a follow-up visit for gluteal cleft rash, anemia/GI bleed.  Recall she has a history of ulcerative pancolitis since age 29.  Failed multiple therapies, ultimately chose to have an elective colectomy in November 2016.  She developed complications postoperatively with a fistula, had high ostomy output, eventually improved with time.  Recall she also has a history of small bowel obstruction.  She was recently seen in the office by Ellouise Console in June for gluteal cleft rash.  She was thought to have candidiasis and treated with nystatin  cream.  Unfortunately shortly after that visit within a week, she was admitted to the [facility] for worsening anemia in the setting of dark stools concerning for melena.  She was admitted from June 12 through June 15 initially for DVTs and pulmonary embolism, started on Eliquis .  She was admitted from June 24 to June 27 for anemia, hemoglobin 6.8 with dark stool concerning for melena.  She underwent an EGD with Dr. Charlanne which was unremarkable.  She had a CTA which was also unremarkable for cause of anemia.  Bleeding stopped on its own.  She has continued Eliquis  since that time and is here for follow-up.  She denies any obvious recurrent melena.  She has dark brown stool that she passes through the ostomy.  She has had some bright red blood noted around the ostomy when she changes it at times.  She has a hard time seeing where it is coming from around the ostomy or if it is from her distal small bowel.  She shows me pictures of this today.  She has not seen ostomy nurse for some time.  She knowledges she did not bring her ostomy supplies today and if she takes her bag off she will not be able to get back on  There was question of doing a capsule endoscopy during inpatient stay however there was concern for her history of bowel obstruction, she has a loop of bowel within the hernia in the abdominal wall, that was not pursued at the  time.  Her hemoglobin improved status post transfusion of 9.3 upon discharge on 627.  Her last hemoglobin was checked on 715 it was 10.7.  She feels that she is doing better.  In regards to her blood clot she follows with Dr. Federico of hematology.  Unclear etiology for this.  No evidence of malignancy to have found.  She had a thyroid  nodule biopsy that was negative.  Otherwise, her main concern is that she continues to have itching and discomfort in her gluteal cleft and perianal region.  It is very pruritic.  She states the cream she was given did not really resolve it.  She asked me to take a look at that today.  Prior workup:  05/2016 last EGD by Dr. Leigh: Benign appearing plaque at the GE junction.  Otherwise normal esophagus, and stomach.  Nodules in the duodenum consistent with benign ectopic gastric mucosa.  Biopsies showed chronic gastritis, otherwise normal.  No dysplasia.   03/2015 last Colonoscopy (on Humira ): Pan ulcerative colitis in the entire colon.  Mucosa was bleeding, friable, edematous.  No polyps.  Biopsy showed chronic mildly active colitis.  No dysplasia.     EGD on 6/25 with no cause for bleeding.   CTA w6/26/25 with no cause for bleeding.      Past Medical History:  Diagnosis Date   Anemia    Anxiety    Arthritis  Blood transfusion    Bowel obstruction (HCC)    Colitis, ulcerative (HCC)    Colon polyps    DVT (deep venous thrombosis) (HCC)    Hyperlipidemia    Hypertension    Ileostomy present (HCC)    Kidney disease    Numbness and tingling    hands and feet bilat    Pulmonary embolism (HCC)    Shortness of breath dyspnea    talking or walking    Type 2 diabetes mellitus with complication, without long-term current use of insulin  (HCC) 09/05/2015   currently on no medications, (05/26/2016) pt denies diabetes.  States that she had one time high blood sugars d/t prednisone      Past Surgical History:  Procedure Laterality Date   ABDOMINAL  HYSTERECTOMY  2000   ABDOMINAL SURGERY     BREAST CYST ASPIRATION     CATARACT EXTRACTION W/PHACO Right 10/20/2017   Procedure: CATARACT EXTRACTION PHACO AND INTRAOCULAR LENS PLACEMENT (IOC);  Surgeon: Myrna Adine Anes, MD;  Location: [facility];  Service: Ophthalmology;  Laterality: Right;  US  00:50.1 AP% 13.2 CDE 6.61 Fluid Pack lot # [id number]   COLON SURGERY     colostomy   cortisol shot     in left shoulder   DILATION AND CURETTAGE OF UTERUS     most likely after miscarriage   ESOPHAGOGASTRODUODENOSCOPY N/A 05/03/2024   Procedure: EGD (ESOPHAGOGASTRODUODENOSCOPY);  Surgeon: Charlanne Groom, MD;  Location: [facility];  Service: Gastroenterology;  Laterality: N/A;   fibrocystic breast disease     q 6 month mammogram   gravida 6 para 2     all SAB   hyadiform mole     ILEO LOOP DIVERSION N/A 09/25/2015   Procedure: ILEO LOOP COLOSTOMY;  Surgeon: Bernarda Ned, MD;  Location: [facility];  Service: General;  Laterality: N/A;   ILEOSTOMY CLOSURE N/A 10/08/2015   Procedure: ILEOSTOMY REVISION;  Surgeon: Bernarda Ned, MD;  Location: [facility];  Service: General;  Laterality: N/A;   ILEOSTOMY CLOSURE N/A 08/20/2016   Procedure: OPEN RELOCATION OF ILEOSTOMY;  Surgeon: Bernarda Ned, MD;  Location: [facility];  Service: General;  Laterality: N/A;   IR GENERIC HISTORICAL  09/01/2016   IR US  GUIDE VASC ACCESS RIGHT 09/01/2016 Franky Rusk, PA-C [facility]   IR GENERIC HISTORICAL  09/01/2016   IR FLUORO GUIDE CV LINE RIGHT 09/01/2016 Franky Rusk, PA-C [facility]   KNEE ARTHROSCOPY W/ MENISCAL REPAIR     right knee ([place]orf)   LAPAROSCOPIC SMALL BOWEL RESECTION N/A 09/25/2015   Procedure: LAPAROSCOPIC BOWEL RESECTION TIMES TWO;  Surgeon: Bernarda Ned, MD;  Location: [facility];  Service: General;  Laterality: N/A;   ROBOTIC ASSISTED LAPAROSCOPIC LYSIS OF ADHESION N/A 09/25/2015   Procedure: XI ROBOTIC ASSISTED LAPAROSCOPIC LYSIS OF ADHESION;  Surgeon: Bernarda Ned, MD;  Location: [facility];   Service: General;  Laterality: N/A;  90 minutes   Small bowel obstruction     Family History  Problem Relation Age of Onset   Diabetes Mother    Breast cancer Mother 40   Diabetes Father    Heart disease Father    HIV Father    Diabetes Sister    Breast cancer Sister 54   Diverticulosis Sister    Throat cancer Sister    Diabetes Sister    Irritable bowel syndrome Sister    Diabetes Brother    Diverticulosis Brother    Diabetes Maternal Grandmother    Hyperlipidemia Daughter    Breast cancer Maternal Aunt  Colon cancer Paternal Aunt    Social History   Tobacco Use   Smoking status: Never    Passive exposure: Never   Smokeless tobacco: Never  Vaping Use   Vaping status: Never Used  Substance Use Topics   Alcohol use: Yes    Alcohol/week: 2.0 standard drinks of alcohol    Types: 2 Standard drinks or equivalent per week    Comment: occ   Drug use: No   Current Outpatient Medications  Medication Sig Dispense Refill   ALPRAZolam  (XANAX ) 0.25 MG tablet Take 1 tablet (0.25 mg total) by mouth 2 (two) times daily as needed for anxiety. 180 tablet 0   apixaban  (ELIQUIS ) 5 MG TABS tablet Take 1 tablet (5 mg total) by mouth 2 (two) times daily. 360 tablet 0   augmented betamethasone dipropionate (DIPROLENE-AF) 0.05 % cream SMARTSIG:sparingly Topical Twice Daily PRN     cholecalciferol (VITAMIN D3) 25 MCG (1000 UNIT) tablet Take 1,000 Units by mouth daily.     hydrocortisone  2.5 % cream APPLY TOPICALLY TWICE DAILY 120 g 0   indapamide  (LOZOL ) 1.25 MG tablet TAKE 1 TABLET BY MOUTH DAILY 100 tablet 2   losartan  (COZAAR ) 50 MG tablet Take 1 tablet (50 mg total) by mouth daily. 90 tablet 1   Multiple Vitamin (MULTIVITAMIN WITH MINERALS) TABS tablet Take 1 tablet by mouth daily with breakfast.     nystatin  cream (MYCOSTATIN ) Apply 1 Application topically 2 (two) times daily. 60 g 1   pantoprazole  (PROTONIX ) 40 MG tablet Take 1 tablet (40 mg total) by mouth daily. 90 tablet 3    potassium chloride  (KLOR-CON ) 10 MEQ tablet TAKE 1 TABLET BY MOUTH 3 TIMES  DAILY 270 tablet 0   No current facility-administered medications for this visit.   Allergies  Allergen Reactions   Betadine  [Povidone Iodine ] Rash and Other (See Comments)    The skin is very sensitive   Latex Rash and Other (See Comments)    Redness of skin   Tape Rash and Other (See Comments)    Redness of skin OK with hypofix tape and paper tape! NO transpore tape adhesive tape!     Review of Systems: All systems reviewed and negative except where noted in HPI.   Labs per HPI as above  Physical Exam: BP 122/72   Pulse 79   Ht 5' 3 (1.6 m)   Wt 169 lb (76.7 kg)   BMI 29.94 kg/m  Constitutional: Pleasant,well-developed, female in no acute distress. Abdominal: Soft, nondistended, Ileostomy - no obvious pathology but patient did not bring supplies to take off ostomy bag, but can see through it and appears normal Perianal exam - CMA Madison Favre standby - small skin tag, DRE not possible due to stricture / suturing post operatively. Gluteal cleft with linear skin breakdown (photo taken - in media section, I have not been able to upload it into this note) Neurological: Alert and oriented to person place and time. Psychiatric: Normal mood and affect. Behavior is normal.   ASSESSMENT: 68 y.o. female here for assessment of the following  1. Anemia, unspecified type   2. Ileostomy in place [facility])   3. Melena   4. Rash and nonspecific skin eruption   5. Anticoagulated    History of ulcerative colitis status post colectomy with ileostomy in place.  History of small bowel obstruction per report.  Recently admitted for worsening anemia in the setting of dark stools and anticoagulated state for PE.  EGD negative, CTA negative.  Her  bleeding resolved without intervention, she remains anticoagulated.  Hemoglobin has been improving.  Since her hospital stay she has not had any further melena but has had some red  blood at times from the ostomy, she is not sure if that is due to irritation at the ostomy site itself or more proximal.  She did not want to take off her ostomy bag today as she did not have supplies to place it back on.  From what I could see through looking through the bag I could not see any obvious stigmata for bleeding.  Discussed options for her.  She is not a great candidate for capsule endoscopy given her history of obstructions.  We could consider either ileoscopy or an enterography study.  I think given the recent fresh blood she has had near her ostomy that ileoscopy is reasonable if she is willing.  We discussed what this is, I think we can get her through a limited ileoscopy awake and get her a good exam, while on Eliquis  as she cannot stop that anytime soon.  She is agreeable to this.  If negative we can consider an enterography study or a patency capsule to see if she would pass it, prior to considering capsule endoscopy.  She would prefer to do enterography study between these options.  In the interim, we will refer her to the ostomy nurse to assist her with care for the ostomy and if any irritation superficially contributing to the bleeding that that can be addressed.  Will also check a hemoglobin today to make sure continued improvement in her anemia.  Otherwise, she has a gluteal cleft dermatitis or skin breakdown.  I do not see pictures from the prior exam that was remarkable for candidiasis.  It does not appear to be consistent with candidiasis today.  I took a picture of it but having a hard time uploading it into this note, it is in the media section of her chart.  Recommend some topical barrier cream such as Desitin applied a few times daily for a week and see how she does.  If continued symptoms then she needs to contact her dermatologist for further evaluation of this area.  PLAN: - ileoscopy at the [facility] - drink 1/2 miralax  prep morning of the procedure, to be done on eliquis  and  may be awake for it. She will have a ride in case anesthesia is used - pending ileoscopy results, consider MRE if negative - refer to ostomy nurse - lab for Hgb today - apply desitin to perianal area BID to TID - if no improvement contact dermatologist for further evaluation  Marcey Naval, MD [facility]

## 2024-06-08 NOTE — Patient Instructions (Addendum)
 You have been scheduled for a Ileoscopy on 8-14 with Dr. Leigh. Please follow written instructions given to you at your visit today.   If you use inhalers (even only as needed), please bring them with you on the day of your procedure.  DO NOT TAKE 7 DAYS PRIOR TO TEST- Trulicity (dulaglutide) Ozempic, Wegovy (semaglutide) Mounjaro (tirzepatide) Bydureon Bcise (exanatide extended release)  DO NOT TAKE 1 DAY PRIOR TO YOUR TEST Rybelsus (semaglutide) Adlyxin (lixisenatide) Victoza (liraglutide) Byetta (exanatide) ___________________________________________________________________________  Please stay on Eliquis  - Do not stop for the procedure.  Please purchase the following medications over the counter and take as directed: Desitin - use on your perianal area two to three times a day.  If not improved, please contact Dermatology.  We are referring you to the [facility].  They will contact you directly to schedule an appointment.  It may take a week or more before you hear from them.  Please feel free to contact us  if you have not heard from them within 2 weeks and we will follow up on the referral.   Please go to the lab in the basement of our building to have lab work done as you leave today. Hit B for basement when you get on the elevator.  When the doors open the lab is on your left.  We will call you with the results. Thank you.  Thank you for entrusting me with your care and for choosing [facility], Dr. Elspeth Leigh    _______________________________________________________  If your blood pressure at your visit was 140/90 or greater, please contact your primary care physician to follow up on this.  _______________________________________________________  If you are age 36 or older, your body mass index should be between 23-30. Your Body mass index is 29.94 kg/m. If this is out of the aforementioned range listed, please consider follow up with your  Primary Care Provider.  If you are age 49 or younger, your body mass index should be between 19-25. Your Body mass index is 29.94 kg/m. If this is out of the aformentioned range listed, please consider follow up with your Primary Care Provider.   ________________________________________________________  The [facility] providers would like to encourage you to use MYCHART to communicate with providers for non-urgent requests or questions.  Due to long hold times on the telephone, sending your provider a message by [facility] may be a faster and more efficient way to get a response.  Please allow 48 business hours for a response.  Please remember that this is for non-urgent requests.  _______________________________________________________  [facility] is using a team-based approach to care.  Your team is made up of your doctor and two to three APPS. Our APPS (Nurse Practitioners and Physician Assistants) work with your physician to ensure care continuity for you. They are fully qualified to address your health concerns and develop a treatment plan. They communicate directly with your gastroenterologist to care for you. Seeing the Advanced Practice Practitioners on your physician's team can help you by facilitating care more promptly, often allowing for earlier appointments, access to diagnostic testing, procedures, and other specialty referrals.

## 2024-06-12 ENCOUNTER — Ambulatory Visit (HOSPITAL_COMMUNITY)
Admission: RE | Admit: 2024-06-12 | Discharge: 2024-06-12 | Disposition: A | Discharge status: Home / Self Care | Source: Ambulatory Visit | Attending: Nurse Practitioner | Admitting: Nurse Practitioner

## 2024-06-12 DIAGNOSIS — L24B3 Irritant contact dermatitis related to fecal or urinary stoma or fistula: Secondary | ICD-10-CM | POA: Diagnosis not present

## 2024-06-12 DIAGNOSIS — K9411 Enterostomy hemorrhage: Secondary | ICD-10-CM | POA: Diagnosis not present

## 2024-06-12 NOTE — Discharge Instructions (Signed)
 Sizing up to 1 inch  Item # [id number]   pre sized convex light High output pouch item# [id number] Barrier ring  belt

## 2024-06-12 NOTE — Progress Notes (Signed)
 [facility]   Reason for visit:  RLQ ileostomy HPI:  Ulcerative colitis with end ileostomy Past Medical History:  Diagnosis Date   Anemia    Anxiety    Arthritis    Blood transfusion    Bowel obstruction (HCC)    Colitis, ulcerative (HCC)    Colon polyps    DVT (deep venous thrombosis) (HCC)    Hyperlipidemia    Hypertension    Ileostomy present (HCC)    Kidney disease    Numbness and tingling    hands and feet bilat    Pulmonary embolism (HCC)    Shortness of breath dyspnea    talking or walking    Type 2 diabetes mellitus with complication, without long-term current use of insulin  (HCC) 09/05/2015   currently on no medications, (05/26/2016) pt denies diabetes.  States that she had one time high blood sugars d/t prednisone    Family History  Problem Relation Age of Onset   Diabetes Mother    Breast cancer Mother 45   Diabetes Father    Heart disease Father    HIV Father    Diabetes Sister    Breast cancer Sister 27   Diverticulosis Sister    Throat cancer Sister    Diabetes Sister    Irritable bowel syndrome Sister    Diabetes Brother    Diverticulosis Brother    Diabetes Maternal Grandmother    Hyperlipidemia Daughter    Breast cancer Maternal Aunt    Colon cancer Paternal Aunt    Allergies  Allergen Reactions   Betadine  [Povidone Iodine ] Rash and Other (See Comments)    The skin is very sensitive   Latex Rash and Other (See Comments)    Redness of skin   Tape Rash and Other (See Comments)    Redness of skin OK with hypofix tape and paper tape! NO transpore tape adhesive tape!   Current Outpatient Medications  Medication Sig Dispense Refill Last Dose/Taking   ALPRAZolam  (XANAX ) 0.25 MG tablet Take 1 tablet (0.25 mg total) by mouth 2 (two) times daily as needed for anxiety. 180 tablet 0    apixaban  (ELIQUIS ) 5 MG TABS tablet Take 1 tablet (5 mg total) by mouth 2 (two) times daily. 360 tablet 0    augmented betamethasone dipropionate  (DIPROLENE-AF) 0.05 % cream SMARTSIG:sparingly Topical Twice Daily PRN      cholecalciferol (VITAMIN D3) 25 MCG (1000 UNIT) tablet Take 1,000 Units by mouth daily.      hydrocortisone  2.5 % cream APPLY TOPICALLY TWICE DAILY 120 g 0    indapamide  (LOZOL ) 1.25 MG tablet TAKE 1 TABLET BY MOUTH DAILY 100 tablet 2    losartan  (COZAAR ) 50 MG tablet Take 1 tablet (50 mg total) by mouth daily. 90 tablet 1    Multiple Vitamin (MULTIVITAMIN WITH MINERALS) TABS tablet Take 1 tablet by mouth daily with breakfast.      nystatin  cream (MYCOSTATIN ) Apply 1 Application topically 2 (two) times daily. 60 g 1    pantoprazole  (PROTONIX ) 40 MG tablet Take 1 tablet (40 mg total) by mouth daily. 90 tablet 3    potassium chloride  (KLOR-CON ) 10 MEQ tablet TAKE 1 TABLET BY MOUTH 3 TIMES  DAILY 270 tablet 0    No current facility-administered medications for this encounter.   ROS  Review of Systems  Respiratory:         Hx pulmonary embolism  Gastrointestinal:        Ulcerative colitis End ileostomy  Skin:  Positive for color change.  Neurological:  Positive for numbness.  Psychiatric/Behavioral: Negative.     Vital signs:  BP 128/80 (BP Location: Right Arm)   Pulse 75   Temp 98.1 F (36.7 C) (Oral)   Resp 18   SpO2 98%  Exam:  Physical Exam Vitals reviewed.  Cardiovascular:     Rate and Rhythm: Normal rate.  Pulmonary:     Effort: Pulmonary effort is normal.  Musculoskeletal:        General: Normal range of motion.  Skin:    General: Skin is warm and dry.     Findings: Erythema present.  Neurological:     Mental Status: She is alert and oriented to person, place, and time. Mental status is at baseline.  Psychiatric:        Mood and Affect: Mood normal.     Stoma type/location:  RLQ ileostomy Stomal assessment/size:  slightly less than 1   Peristomal assessment:  intact Treatment options for stomal/peristomal skin: barrier ring and ostomy belt Output: liquid brown stool  Ostomy pouching:  2pc. Flexible convex Education provided:  Patient concerned with bleeding stoma.  Her pouch opening (presized) is too small.  This same issue was noted while inpatient in June.  She has continued to use the same size barrier. She uses a high output pouch.  I will fax her updated orders to [facility].  ITEM # [id number] 1 inch presized barrier ITem # [id number] high output pouch Barrier ring and coloplast Mio belt.     Impression/dx  Stomal complications, bleeding Irritant contact dermatitis Discussion  Will update orders with Syn[facility] Patient understands that as stoma is fluctuating in size and moving in and out with digestion, it is rubbing barrier and causing trauma and bleeding.  Plan  Update synapse     Visit time: 45 minutes.   Darice Cooley FNP-BC

## 2024-06-13 ENCOUNTER — Other Ambulatory Visit (HOSPITAL_COMMUNITY): Payer: Self-pay | Admitting: Nurse Practitioner

## 2024-06-13 DIAGNOSIS — L24B3 Irritant contact dermatitis related to fecal or urinary stoma or fistula: Secondary | ICD-10-CM

## 2024-06-13 DIAGNOSIS — R198 Other specified symptoms and signs involving the digestive system and abdomen: Secondary | ICD-10-CM

## 2024-06-19 ENCOUNTER — Other Ambulatory Visit: Payer: Self-pay | Admitting: Physician Assistant

## 2024-06-19 DIAGNOSIS — I2699 Other pulmonary embolism without acute cor pulmonale: Secondary | ICD-10-CM

## 2024-06-19 DIAGNOSIS — Z9189 Other specified personal risk factors, not elsewhere classified: Secondary | ICD-10-CM | POA: Insufficient documentation

## 2024-06-19 DIAGNOSIS — R198 Other specified symptoms and signs involving the digestive system and abdomen: Secondary | ICD-10-CM | POA: Insufficient documentation

## 2024-06-19 DIAGNOSIS — Z86718 Personal history of other venous thrombosis and embolism: Secondary | ICD-10-CM

## 2024-06-20 ENCOUNTER — Inpatient Hospital Stay: Attending: Hematology and Oncology

## 2024-06-20 DIAGNOSIS — Z86718 Personal history of other venous thrombosis and embolism: Secondary | ICD-10-CM

## 2024-06-20 DIAGNOSIS — I2699 Other pulmonary embolism without acute cor pulmonale: Secondary | ICD-10-CM

## 2024-06-20 LAB — IRON AND IRON BINDING CAPACITY (CC-WL,HP ONLY)
Iron: 45 ug/dL (ref 28–170)
Saturation Ratios: 9 % — ABNORMAL LOW (ref 10.4–31.8)
TIBC: 515 ug/dL — ABNORMAL HIGH (ref 250–450)
UIBC: 470 ug/dL — ABNORMAL HIGH (ref 148–442)

## 2024-06-20 LAB — CBC WITH DIFFERENTIAL (CANCER CENTER ONLY)
Abs Immature Granulocytes: 0.02 K/uL (ref 0.00–0.07)
Basophils Absolute: 0 K/uL (ref 0.0–0.1)
Basophils Relative: 0 %
Eosinophils Absolute: 0.2 K/uL (ref 0.0–0.5)
Eosinophils Relative: 2 %
HCT: 34.5 % — ABNORMAL LOW (ref 36.0–46.0)
Hemoglobin: 11.4 g/dL — ABNORMAL LOW (ref 12.0–15.0)
Immature Granulocytes: 0 %
Lymphocytes Relative: 25 %
Lymphs Abs: 1.7 K/uL (ref 0.7–4.0)
MCH: 27.7 pg (ref 26.0–34.0)
MCHC: 33 g/dL (ref 30.0–36.0)
MCV: 83.7 fL (ref 80.0–100.0)
Monocytes Absolute: 0.5 K/uL (ref 0.1–1.0)
Monocytes Relative: 8 %
Neutro Abs: 4.4 K/uL (ref 1.7–7.7)
Neutrophils Relative %: 65 %
Platelet Count: 346 K/uL (ref 150–400)
RBC: 4.12 MIL/uL (ref 3.87–5.11)
RDW: 13 % (ref 11.5–15.5)
WBC Count: 6.8 K/uL (ref 4.0–10.5)
nRBC: 0 % (ref 0.0–0.2)

## 2024-06-20 LAB — CMP (CANCER CENTER ONLY)
ALT: 9 U/L (ref 0–44)
AST: 15 U/L (ref 15–41)
Albumin: 3.9 g/dL (ref 3.5–5.0)
Alkaline Phosphatase: 86 U/L (ref 38–126)
Anion gap: 7 (ref 5–15)
BUN: 19 mg/dL (ref 8–23)
CO2: 28 mmol/L (ref 22–32)
Calcium: 9.2 mg/dL (ref 8.9–10.3)
Chloride: 103 mmol/L (ref 98–111)
Creatinine: 1.2 mg/dL — ABNORMAL HIGH (ref 0.44–1.00)
GFR, Estimated: 49 mL/min — ABNORMAL LOW (ref >60)
Glucose, Bld: 90 mg/dL (ref 70–99)
Potassium: 3.8 mmol/L (ref 3.5–5.1)
Sodium: 138 mmol/L (ref 135–145)
Total Bilirubin: 0.4 mg/dL (ref 0.0–1.2)
Total Protein: 7.4 g/dL (ref 6.5–8.1)

## 2024-06-20 LAB — FERRITIN: Ferritin: 15 ng/mL (ref 11–307)

## 2024-06-21 ENCOUNTER — Ambulatory Visit: Payer: Self-pay

## 2024-06-21 ENCOUNTER — Other Ambulatory Visit: Payer: Self-pay | Admitting: Physician Assistant

## 2024-06-21 ENCOUNTER — Telehealth: Payer: Self-pay

## 2024-06-21 DIAGNOSIS — D508 Other iron deficiency anemias: Secondary | ICD-10-CM

## 2024-06-21 DIAGNOSIS — D509 Iron deficiency anemia, unspecified: Secondary | ICD-10-CM | POA: Insufficient documentation

## 2024-06-21 NOTE — Telephone Encounter (Signed)
 [name], patient will be scheduled as soon as possible.  Auth Submission: NO AUTH NEEDED Site of care: Site of care: [facility] Payer: [employer] medicare Medication & CPT/J Code(s) submitted: Feraheme  (ferumoxytol ) [id number] Diagnosis Code:  Route of submission (phone, fax, portal):  Phone # Fax # Auth type: Buy/Bill PB Units/visits requested: 510mg  x 2 doses Reference number:  Approval from: 06/21/24 to 10/21/24

## 2024-06-21 NOTE — Telephone Encounter (Signed)
-----   Message from [name] [employer] [employer] sent at 06/21/2024  9:56 AM EDT ----- Can you let patient know her hemoglobin has improved but still below normal. In addition, iron levels are still low.   Recommend IV iron to boost her levels. We will arrange at [employer].   ----- Message ----- From: Rebecka, Lab [facility] [place] Sent: 06/20/2024  10:04 AM EDT To: [name] [employer], PA-C

## 2024-06-21 NOTE — Telephone Encounter (Signed)
 Pt advised and agreed to this plan of care.  She is aware that [facility] will call her to set up her appt after insurance approval.

## 2024-06-22 ENCOUNTER — Encounter: Payer: Self-pay | Admitting: Gastroenterology

## 2024-06-22 ENCOUNTER — Ambulatory Visit: Admitting: Gastroenterology

## 2024-06-22 VITALS — BP 129/77 | HR 62 | Temp 97.7°F | Resp 16 | Ht 63.0 in | Wt 169.0 lb

## 2024-06-22 DIAGNOSIS — Z932 Ileostomy status: Secondary | ICD-10-CM | POA: Diagnosis not present

## 2024-06-22 DIAGNOSIS — Z7901 Long term (current) use of anticoagulants: Secondary | ICD-10-CM | POA: Diagnosis not present

## 2024-06-22 DIAGNOSIS — D62 Acute posthemorrhagic anemia: Secondary | ICD-10-CM | POA: Diagnosis not present

## 2024-06-22 DIAGNOSIS — D649 Anemia, unspecified: Secondary | ICD-10-CM | POA: Diagnosis not present

## 2024-06-22 MED ORDER — SODIUM CHLORIDE 0.9 % IV SOLN: 500.0000 mL | INTRAVENOUS | Status: DC

## 2024-06-22 NOTE — Op Note (Signed)
 [facility] Patient Name: Latasha Wood Procedure Date: 06/22/2024 3:30 PM MRN: [id number] Endoscopist: Latasha Wood , MD, [id number] Age: 68 Referring MD:  Date of Birth: 1956/03/28 Gender: Female Account #: [id number] Procedure:                Ileoscopy Indications:              Acute post hemorrhagic anemia, Melena while on                            Eliquis  - negative EGD. no recurrent bleeding, Hgb                            improving. Has had some superficial bleeding she                            thinks from the ostomy. Capsule not performed given                            history of obstruction, loop of bowel within hernia                            on imaging. Medicines:                Monitored Anesthesia Care Procedure:                Pre-Anesthesia Assessment:                           - Prior to the procedure, a History and Physical                            was performed, and patient medications and                            allergies were reviewed. The patient's tolerance of                            previous anesthesia was also reviewed. The risks                            and benefits of the procedure and the sedation                            options and risks were discussed with the patient.                            All questions were answered, and informed consent                            was obtained. Prior Anticoagulants: The patient has                            taken Eliquis  (apixaban ), last dose was day of  procedure. ASA Grade Assessment: II - A patient                            with mild systemic disease. After reviewing the                            risks and benefits, the patient was deemed in                            satisfactory condition to undergo the procedure.                           After I obtained informed consent, the scope was                            passed under direct vision.  Throughout the                            procedure, the patient's blood pressure, pulse, and                            oxygen saturations were monitored continuously. The                            Olympus Scope [id number] was introduced through the                            ileostomy and advanced to the the terminal ileum.                            After I obtained informed consent, the scope was                            passed under direct vision. Throughout the                            procedure, the patient's blood pressure, pulse, and                            oxygen saturations were monitored continuously.The                            ileoscopy was performed without difficulty. The                            patient tolerated the procedure well. The quality                            of the bowel preparation was good. Scope In: 3:39:38 PM Scope Out: 3:43:14 PM Total Procedure Duration: 0 hours 3 minutes 36 seconds  Findings:                 The ileostomy appeared normal. No superficial  breakdown appreciated. The examined ileum appeared                            normal - no inflammation, no cause for bleeding                            noted. Complications:            No immediate complications. Estimated blood loss:                            None. Estimated Blood Loss:     Estimated blood loss: none. Impression:               - The examined portion of the ileum was normal.                           - Normal ileostomy.                           No cause for bleeding at this time. Recommendation:           - Discharge patient to home.                           - Resume previous diet.                           - Continue present medications including Eliquis .                           - Follow up with ostomy nurse as previously                            discussed (she previously had superficial                            irritation at the  ostomy with bleeding)                           - trend Hgb, monitor for recurrent bleeding                           - Consideration for MRE pending her course. Will                            discuss with her. Could also consider patency                            capsule followed by capsule endoscopy if passed, if                            recurrent bleeding without clear cause otherwise Latasha Wood. Latasha Lanting, MD 06/22/2024 3:50:55 PM This report has been signed electronically.

## 2024-06-22 NOTE — Progress Notes (Signed)
 Sedate, gd SR, tolerated procedure well, VSS, report to RN

## 2024-06-22 NOTE — Progress Notes (Signed)
 History and Physical Interval Note: See office note 06/08/24 - no interval changes. History of GIB, melena, negative EGD. History of colectomy for UC, s/p ileostomy, has had some recurrent bleeding recently. Not a good candidate for capsule study given history of obstructions / loop of bowel within hernia sac. Ileoscopy to further evaluate. Exam to be done ON Eliquis , recent history of DVT / PE on anticoagulation. She is otherwise feeling well. Has some superficial bleeding, no recurrent melena.  06/22/2024 3:27 PM  Latasha Wood  has presented today for endoscopic procedure(s), with the diagnosis of  Encounter Diagnoses  Name Primary?   Anemia, unspecified type Yes   Ileostomy in place [facility])   .  The various methods of evaluation and treatment have been discussed with the patient and/or family. After consideration of risks, benefits and other options for treatment, the patient has consented to  the endoscopic procedure(s).   The patient's history has been reviewed, patient examined, no change in status, stable for surgery.  I have reviewed the patient's chart and labs.  Questions were answered to the patient's satisfaction.    Marcey Naval, MD [facility]

## 2024-06-22 NOTE — Patient Instructions (Signed)
 Resume previous diet. Continue present medications including Eliquis . Follow up with ostomy nurse as previously discussed   YOU HAD AN ENDOSCOPIC PROCEDURE TODAY AT THE [facility]:   Refer to the procedure report that was given to you for any specific questions about what was found during the examination.  If the procedure report does not answer your questions, please call your gastroenterologist to clarify.  If you requested that your care partner not be given the details of your procedure findings, then the procedure report has been included in a sealed envelope for you to review at your convenience later.  YOU SHOULD EXPECT: Some feelings of bloating in the abdomen. Passage of more gas than usual.  Walking can help get rid of the air that was put into your GI tract during the procedure and reduce the bloating. If you had a lower endoscopy (such as a colonoscopy or flexible sigmoidoscopy) you may notice spotting of blood in your stool or on the toilet paper. If you underwent a bowel prep for your procedure, you may not have a normal bowel movement for a few days.  Please Note:  You might notice some irritation and congestion in your nose or some drainage.  This is from the oxygen used during your procedure.  There is no need for concern and it should clear up in a day or so.  SYMPTOMS TO REPORT IMMEDIATELY:  Following lower endoscopy (colonoscopy or flexible sigmoidoscopy):  Excessive amounts of blood in the stool  Significant tenderness or worsening of abdominal pains  Swelling of the abdomen that is new, acute  Fever of 100F or higher  For urgent or emergent issues, a gastroenterologist can be reached at any hour by calling [phone]. Do not use MyChart messaging for urgent concerns.    DIET:  We do recommend a small meal at first, but then you may proceed to your regular diet.  Drink plenty of fluids but you should avoid alcoholic beverages for 24 hours.  ACTIVITY:   You should plan to take it easy for the rest of today and you should NOT DRIVE or use heavy machinery until tomorrow (because of the sedation medicines used during the test).    FOLLOW UP: Our staff will call the number listed on your records the next business day following your procedure.  We will call around 7:15- 8:00 am to check on you and address any questions or concerns that you may have regarding the information given to you following your procedure. If we do not reach you, we will leave a message.     If any biopsies were taken you will be contacted by phone or by letter within the next 1-3 weeks.  Please call us  at [phone] if you have not heard about the biopsies in 3 weeks.    SIGNATURES/CONFIDENTIALITY: You and/or your care partner have signed paperwork which will be entered into your electronic medical record.  These signatures attest to the fact that that the information above on your After Visit Summary has been reviewed and is understood.  Full responsibility of the confidentiality of this discharge information lies with you and/or your care-partner.

## 2024-06-23 ENCOUNTER — Encounter (HOSPITAL_COMMUNITY): Payer: Self-pay | Admitting: Anesthesiology

## 2024-06-23 ENCOUNTER — Inpatient Hospital Stay (HOSPITAL_COMMUNITY)

## 2024-06-23 ENCOUNTER — Encounter (HOSPITAL_COMMUNITY): Payer: Self-pay

## 2024-06-23 ENCOUNTER — Inpatient Hospital Stay (HOSPITAL_COMMUNITY)
Admission: EM | Admit: 2024-06-23 | Discharge: 2024-06-29 | DRG: 377 | Disposition: A | Discharge status: Home / Self Care | Attending: Internal Medicine | Admitting: Internal Medicine

## 2024-06-23 ENCOUNTER — Other Ambulatory Visit: Payer: Self-pay

## 2024-06-23 ENCOUNTER — Telehealth: Payer: Self-pay | Admitting: Gastroenterology

## 2024-06-23 ENCOUNTER — Telehealth: Payer: Self-pay

## 2024-06-23 ENCOUNTER — Emergency Department (HOSPITAL_COMMUNITY)

## 2024-06-23 ENCOUNTER — Encounter: Payer: Self-pay | Admitting: Gastroenterology

## 2024-06-23 DIAGNOSIS — K9289 Other specified diseases of the digestive system: Secondary | ICD-10-CM | POA: Diagnosis not present

## 2024-06-23 DIAGNOSIS — K922 Gastrointestinal hemorrhage, unspecified: Principal | ICD-10-CM

## 2024-06-23 DIAGNOSIS — T183XXA Foreign body in small intestine, initial encounter: Secondary | ICD-10-CM | POA: Diagnosis not present

## 2024-06-23 DIAGNOSIS — Z881 Allergy status to other antibiotic agents status: Secondary | ICD-10-CM

## 2024-06-23 DIAGNOSIS — E785 Hyperlipidemia, unspecified: Secondary | ICD-10-CM | POA: Diagnosis present

## 2024-06-23 DIAGNOSIS — I10 Essential (primary) hypertension: Secondary | ICD-10-CM | POA: Diagnosis not present

## 2024-06-23 DIAGNOSIS — Z86718 Personal history of other venous thrombosis and embolism: Secondary | ICD-10-CM | POA: Diagnosis not present

## 2024-06-23 DIAGNOSIS — Z803 Family history of malignant neoplasm of breast: Secondary | ICD-10-CM

## 2024-06-23 DIAGNOSIS — I2699 Other pulmonary embolism without acute cor pulmonale: Secondary | ICD-10-CM | POA: Diagnosis present

## 2024-06-23 DIAGNOSIS — T189XXA Foreign body of alimentary tract, part unspecified, initial encounter: Secondary | ICD-10-CM | POA: Diagnosis not present

## 2024-06-23 DIAGNOSIS — D62 Acute posthemorrhagic anemia: Secondary | ICD-10-CM | POA: Diagnosis not present

## 2024-06-23 DIAGNOSIS — I2694 Multiple subsegmental pulmonary emboli without acute cor pulmonale: Secondary | ICD-10-CM | POA: Diagnosis not present

## 2024-06-23 DIAGNOSIS — Z800 Family history of malignant neoplasm of digestive organs: Secondary | ICD-10-CM | POA: Diagnosis not present

## 2024-06-23 DIAGNOSIS — K625 Hemorrhage of anus and rectum: Secondary | ICD-10-CM | POA: Diagnosis present

## 2024-06-23 DIAGNOSIS — K9189 Other postprocedural complications and disorders of digestive system: Secondary | ICD-10-CM | POA: Diagnosis not present

## 2024-06-23 DIAGNOSIS — R935 Abnormal findings on diagnostic imaging of other abdominal regions, including retroperitoneum: Secondary | ICD-10-CM | POA: Diagnosis not present

## 2024-06-23 DIAGNOSIS — Z91040 Latex allergy status: Secondary | ICD-10-CM | POA: Diagnosis not present

## 2024-06-23 DIAGNOSIS — Z8249 Family history of ischemic heart disease and other diseases of the circulatory system: Secondary | ICD-10-CM | POA: Diagnosis not present

## 2024-06-23 DIAGNOSIS — E1122 Type 2 diabetes mellitus with diabetic chronic kidney disease: Secondary | ICD-10-CM | POA: Diagnosis not present

## 2024-06-23 DIAGNOSIS — Z833 Family history of diabetes mellitus: Secondary | ICD-10-CM

## 2024-06-23 DIAGNOSIS — Z932 Ileostomy status: Secondary | ICD-10-CM | POA: Diagnosis not present

## 2024-06-23 DIAGNOSIS — Z91048 Other nonmedicinal substance allergy status: Secondary | ICD-10-CM

## 2024-06-23 DIAGNOSIS — Z9049 Acquired absence of other specified parts of digestive tract: Secondary | ICD-10-CM

## 2024-06-23 DIAGNOSIS — D6832 Hemorrhagic disorder due to extrinsic circulating anticoagulants: Secondary | ICD-10-CM | POA: Diagnosis present

## 2024-06-23 DIAGNOSIS — D509 Iron deficiency anemia, unspecified: Secondary | ICD-10-CM | POA: Diagnosis not present

## 2024-06-23 DIAGNOSIS — F419 Anxiety disorder, unspecified: Secondary | ICD-10-CM | POA: Diagnosis present

## 2024-06-23 DIAGNOSIS — E876 Hypokalemia: Secondary | ICD-10-CM | POA: Diagnosis not present

## 2024-06-23 DIAGNOSIS — M1990 Unspecified osteoarthritis, unspecified site: Secondary | ICD-10-CM | POA: Diagnosis present

## 2024-06-23 DIAGNOSIS — I824Y9 Acute embolism and thrombosis of unspecified deep veins of unspecified proximal lower extremity: Secondary | ICD-10-CM

## 2024-06-23 DIAGNOSIS — K9411 Enterostomy hemorrhage: Secondary | ICD-10-CM | POA: Diagnosis not present

## 2024-06-23 DIAGNOSIS — Z808 Family history of malignant neoplasm of other organs or systems: Secondary | ICD-10-CM | POA: Diagnosis not present

## 2024-06-23 DIAGNOSIS — K921 Melena: Secondary | ICD-10-CM | POA: Diagnosis not present

## 2024-06-23 DIAGNOSIS — Z888 Allergy status to other drugs, medicaments and biological substances status: Secondary | ICD-10-CM

## 2024-06-23 DIAGNOSIS — I129 Hypertensive chronic kidney disease with stage 1 through stage 4 chronic kidney disease, or unspecified chronic kidney disease: Secondary | ICD-10-CM | POA: Diagnosis not present

## 2024-06-23 DIAGNOSIS — Z883 Allergy status to other anti-infective agents status: Secondary | ICD-10-CM

## 2024-06-23 DIAGNOSIS — K439 Ventral hernia without obstruction or gangrene: Secondary | ICD-10-CM | POA: Diagnosis not present

## 2024-06-23 DIAGNOSIS — N179 Acute kidney failure, unspecified: Secondary | ICD-10-CM | POA: Diagnosis not present

## 2024-06-23 DIAGNOSIS — R578 Other shock: Secondary | ICD-10-CM | POA: Diagnosis not present

## 2024-06-23 DIAGNOSIS — Z7901 Long term (current) use of anticoagulants: Secondary | ICD-10-CM | POA: Diagnosis not present

## 2024-06-23 DIAGNOSIS — E878 Other disorders of electrolyte and fluid balance, not elsewhere classified: Secondary | ICD-10-CM | POA: Diagnosis not present

## 2024-06-23 DIAGNOSIS — T182XXA Foreign body in stomach, initial encounter: Secondary | ICD-10-CM | POA: Diagnosis not present

## 2024-06-23 DIAGNOSIS — D649 Anemia, unspecified: Secondary | ICD-10-CM

## 2024-06-23 DIAGNOSIS — Z860100 Personal history of colon polyps, unspecified: Secondary | ICD-10-CM

## 2024-06-23 DIAGNOSIS — Z79899 Other long term (current) drug therapy: Secondary | ICD-10-CM

## 2024-06-23 DIAGNOSIS — Z933 Colostomy status: Secondary | ICD-10-CM | POA: Diagnosis not present

## 2024-06-23 DIAGNOSIS — J9811 Atelectasis: Secondary | ICD-10-CM | POA: Diagnosis not present

## 2024-06-23 DIAGNOSIS — K219 Gastro-esophageal reflux disease without esophagitis: Secondary | ICD-10-CM | POA: Diagnosis present

## 2024-06-23 DIAGNOSIS — E049 Nontoxic goiter, unspecified: Secondary | ICD-10-CM | POA: Diagnosis not present

## 2024-06-23 DIAGNOSIS — Z90710 Acquired absence of both cervix and uterus: Secondary | ICD-10-CM

## 2024-06-23 DIAGNOSIS — K5190 Ulcerative colitis, unspecified, without complications: Secondary | ICD-10-CM | POA: Diagnosis present

## 2024-06-23 DIAGNOSIS — Z86711 Personal history of pulmonary embolism: Secondary | ICD-10-CM | POA: Diagnosis not present

## 2024-06-23 DIAGNOSIS — N1831 Chronic kidney disease, stage 3a: Secondary | ICD-10-CM | POA: Diagnosis not present

## 2024-06-23 DIAGNOSIS — I700 Atherosclerosis of aorta: Secondary | ICD-10-CM | POA: Diagnosis not present

## 2024-06-23 DIAGNOSIS — T45515A Adverse effect of anticoagulants, initial encounter: Secondary | ICD-10-CM | POA: Diagnosis not present

## 2024-06-23 DIAGNOSIS — N281 Cyst of kidney, acquired: Secondary | ICD-10-CM | POA: Diagnosis not present

## 2024-06-23 HISTORY — PX: IR US GUIDE VASC ACCESS RIGHT: IMG2390

## 2024-06-23 HISTORY — PX: IR ANGIOGRAM VISCERAL SELECTIVE: IMG657

## 2024-06-23 HISTORY — PX: IR ANGIOGRAM SELECTIVE EACH ADDITIONAL VESSEL: IMG667

## 2024-06-23 LAB — CBC
HCT: 31.4 % — ABNORMAL LOW (ref 36.0–46.0)
Hemoglobin: 10.2 g/dL — ABNORMAL LOW (ref 12.0–15.0)
MCH: 27.3 pg (ref 26.0–34.0)
MCHC: 32.5 g/dL (ref 30.0–36.0)
MCV: 84.2 fL (ref 80.0–100.0)
Platelets: 366 K/uL (ref 150–400)
RBC: 3.73 MIL/uL — ABNORMAL LOW (ref 3.87–5.11)
RDW: 13 % (ref 11.5–15.5)
WBC: 8.3 K/uL (ref 4.0–10.5)
nRBC: 0 % (ref 0.0–0.2)

## 2024-06-23 LAB — RETICULOCYTES
Immature Retic Fract: 9.8 % (ref 2.3–15.9)
RBC.: 4.15 MIL/uL (ref 3.87–5.11)
Retic Count, Absolute: 58.5 K/uL (ref 19.0–186.0)
Retic Ct Pct: 1.4 % (ref 0.4–3.1)

## 2024-06-23 LAB — COMPREHENSIVE METABOLIC PANEL WITH GFR
ALT: 11 U/L (ref 0–44)
AST: 20 U/L (ref 15–41)
Albumin: 3.2 g/dL — ABNORMAL LOW (ref 3.5–5.0)
Alkaline Phosphatase: 79 U/L (ref 38–126)
Anion gap: 10 (ref 5–15)
BUN: 26 mg/dL — ABNORMAL HIGH (ref 8–23)
CO2: 26 mmol/L (ref 22–32)
Calcium: 9.4 mg/dL (ref 8.9–10.3)
Chloride: 102 mmol/L (ref 98–111)
Creatinine, Ser: 1.06 mg/dL — ABNORMAL HIGH (ref 0.44–1.00)
GFR, Estimated: 57 mL/min — ABNORMAL LOW (ref >60)
Glucose, Bld: 104 mg/dL — ABNORMAL HIGH (ref 70–99)
Potassium: 3.2 mmol/L — ABNORMAL LOW (ref 3.5–5.1)
Sodium: 138 mmol/L (ref 135–145)
Total Bilirubin: 0.6 mg/dL (ref 0.0–1.2)
Total Protein: 7.2 g/dL (ref 6.5–8.1)

## 2024-06-23 LAB — IRON AND TIBC
Iron: 212 ug/dL — ABNORMAL HIGH (ref 28–170)
Saturation Ratios: 50 % — ABNORMAL HIGH (ref 10.4–31.8)
TIBC: 426 ug/dL (ref 250–450)
UIBC: 214 ug/dL

## 2024-06-23 LAB — TROPONIN I (HIGH SENSITIVITY): Troponin I (High Sensitivity): 4 ng/L (ref <18)

## 2024-06-23 LAB — FOLATE: Folate: 20.8 ng/mL (ref >5.9)

## 2024-06-23 LAB — LACTIC ACID, PLASMA
Lactic Acid, Venous: 1.3 mmol/L (ref 0.5–1.9)
Lactic Acid, Venous: 1 mmol/L (ref 0.5–1.9)

## 2024-06-23 LAB — HEMOGLOBIN AND HEMATOCRIT, BLOOD
HCT: 27.6 % — ABNORMAL LOW (ref 36.0–46.0)
HCT: 35.3 % — ABNORMAL LOW (ref 36.0–46.0)
Hemoglobin: 9 g/dL — ABNORMAL LOW (ref 12.0–15.0)
Hemoglobin: 10.9 g/dL — ABNORMAL LOW (ref 12.0–15.0)

## 2024-06-23 LAB — VITAMIN B12: Vitamin B-12: 328 pg/mL (ref 180–914)

## 2024-06-23 LAB — FERRITIN: Ferritin: 10 ng/mL — ABNORMAL LOW (ref 11–307)

## 2024-06-23 LAB — PREPARE RBC (CROSSMATCH)

## 2024-06-23 MED ORDER — CALCIUM GLUCONATE 10 % IV SOLN: 2.0000 g | Freq: Once | INTRAVENOUS | Status: DC

## 2024-06-23 MED ORDER — POTASSIUM CHLORIDE 10 MEQ/100ML IV SOLN
10.0000 meq | INTRAVENOUS | Status: AC
  Administered 2024-06-23 – 2024-06-24 (×4): 10 meq via INTRAVENOUS
  Filled 2024-06-23 (×3): qty 100

## 2024-06-23 MED ORDER — IOHEXOL 300 MG/ML  SOLN
100.0000 mL | Freq: Once | INTRAMUSCULAR | Status: AC | PRN
  Administered 2024-06-23: 30 mL via INTRA_ARTERIAL

## 2024-06-23 MED ORDER — MUPIROCIN 2 % EX OINT
1.0000 | TOPICAL_OINTMENT | Freq: Two times a day (BID) | CUTANEOUS | Status: DC
  Administered 2024-06-23: 1 via NASAL

## 2024-06-23 MED ORDER — ORAL CARE MOUTH RINSE
15.0000 mL | OROMUCOSAL | Status: DC | PRN
Start: 2024-06-23 — End: 2024-06-29

## 2024-06-23 MED ORDER — CHLORHEXIDINE GLUCONATE CLOTH 2 % EX PADS: 6.0000 | MEDICATED_PAD | Freq: Every day | CUTANEOUS | Status: DC

## 2024-06-23 MED ORDER — LIDOCAINE-EPINEPHRINE 1 %-1:100000 IJ SOLN
INTRAMUSCULAR | Status: AC
  Filled 2024-06-23: qty 1

## 2024-06-23 MED ORDER — IOHEXOL 300 MG/ML  SOLN
100.0000 mL | Freq: Once | INTRAMUSCULAR | Status: AC | PRN
  Administered 2024-06-23: 10 mL via INTRA_ARTERIAL

## 2024-06-23 MED ORDER — PROTHROMBIN COMPLEX CONC HUMAN 500 UNITS IV KIT
1626.0000 [IU] | PACK | Status: AC
  Administered 2024-06-23: 1626 [IU] via INTRAVENOUS
  Filled 2024-06-23: qty 1076

## 2024-06-23 MED ORDER — ALPRAZOLAM 0.25 MG PO TABS
0.2500 mg | ORAL_TABLET | Freq: Two times a day (BID) | ORAL | Status: DC | PRN
  Administered 2024-06-23 – 2024-06-27 (×4): 0.25 mg via ORAL
  Filled 2024-06-23 (×4): qty 1

## 2024-06-23 MED ORDER — VITAMIN K1 10 MG/ML IJ SOLN: 10.0000 mg | INTRAVENOUS | Status: DC

## 2024-06-23 MED ORDER — CHLORHEXIDINE GLUCONATE CLOTH 2 % EX PADS
6.0000 | MEDICATED_PAD | Freq: Every day | CUTANEOUS | Status: DC
  Administered 2024-06-24: 6 via TOPICAL

## 2024-06-23 MED ORDER — FENTANYL CITRATE (PF) 100 MCG/2ML IJ SOLN
INTRAMUSCULAR | Status: AC | PRN
  Administered 2024-06-23 (×2): 50 ug via INTRAVENOUS

## 2024-06-23 MED ORDER — POLYETHYLENE GLYCOL 3350 17 GM/SCOOP PO POWD
119.0000 g | Freq: Once | ORAL | Status: AC
  Administered 2024-06-24: 119 g via ORAL
  Filled 2024-06-23: qty 119

## 2024-06-23 MED ORDER — SODIUM CHLORIDE 0.9% IV SOLUTION: Freq: Once | INTRAVENOUS | Status: AC

## 2024-06-23 MED ORDER — LIDOCAINE-EPINEPHRINE 1 %-1:100000 IJ SOLN
20.0000 mL | Freq: Once | INTRAMUSCULAR | Status: AC
  Administered 2024-06-23: 10 mL via INTRADERMAL

## 2024-06-23 MED ORDER — SODIUM CHLORIDE 0.9 % IV SOLN: INTRAVENOUS | Status: DC

## 2024-06-23 MED ORDER — IOHEXOL 350 MG/ML SOLN: 100.0000 mL | Freq: Once | INTRAVENOUS | Status: DC | PRN

## 2024-06-23 MED ORDER — TRANEXAMIC ACID-NACL 1000-0.7 MG/100ML-% IV SOLN
1000.0000 mg | INTRAVENOUS | Status: AC
  Administered 2024-06-23: 1000 mg via INTRAVENOUS
  Filled 2024-06-23: qty 100

## 2024-06-23 MED ORDER — IOHEXOL 350 MG/ML SOLN
80.0000 mL | Freq: Once | INTRAVENOUS | Status: AC | PRN
  Administered 2024-06-23: 80 mL via INTRAVENOUS

## 2024-06-23 MED ORDER — FENTANYL CITRATE (PF) 100 MCG/2ML IJ SOLN
INTRAMUSCULAR | Status: AC
Start: 2024-06-23 — End: 2024-06-23
  Filled 2024-06-23: qty 2

## 2024-06-23 MED ORDER — ONDANSETRON HCL 4 MG/2ML IJ SOLN
4.0000 mg | Freq: Four times a day (QID) | INTRAMUSCULAR | Status: DC | PRN
  Administered 2024-06-23: 4 mg via INTRAVENOUS
  Filled 2024-06-23: qty 2

## 2024-06-23 MED ORDER — FENTANYL CITRATE (PF) 100 MCG/2ML IJ SOLN
INTRAMUSCULAR | Status: AC
  Filled 2024-06-23: qty 2

## 2024-06-23 MED ORDER — IOHEXOL 300 MG/ML  SOLN
50.0000 mL | Freq: Once | INTRAMUSCULAR | Status: AC | PRN
  Administered 2024-06-23: 10 mL

## 2024-06-23 MED ORDER — CALCIUM GLUCONATE-NACL 2-0.675 GM/100ML-% IV SOLN
2.0000 g | Freq: Once | INTRAVENOUS | Status: AC
  Administered 2024-06-23: 2000 mg via INTRAVENOUS
  Filled 2024-06-23: qty 100

## 2024-06-23 NOTE — Consult Note (Signed)
 Chief Complaint:  Melena through ileostomy w/ concern for GI bleed  Procedure: Angiogram with possible embolization   Referring Provider(s): Alan Coombs, PA-C  Supervising Physician: Hughes Simmonds  Patient Status: [facility] - In-pt  History of Present Illness: Latasha Wood is a 68 y.o. female with a history of UC s/p elective colectomy in 2016 complicated by fistula, high ostomy output, SBO, and GI bleed. Also with a history of DVT on Eliquis . She presented to the ED earlier today with concerns of bloody stool in her ileostomy bag. Reportedly underwent ileoscopy yesterday with no complication. However, this morning started to notice bloody stool, including dark tarry stool mixed with bright red blood. Last dose of Eliquis  last night. CTA this morning with probable accumulation of high density material in the dilated segment of bowel in pelvis near surgical anastomosis concerning for possible focus of gastrointestinal bleeding. She was initially hemodynamically stable in the ED with H/H of 10.2/31.4. Unfortunately, she later experienced a syncopal event and was found to be hypotensive. Repeat H/H 9.0/27.6. Resuscitation with fluids and blood transfusion was initiated and she was transferred to the ICU. IR consulted for possible embolization. Imaging and case reviewed and approved by Dr. KANDICE Moan and Dr. JINNY Hughes.   Patient resting in bed on arrival to room. Admits to worsening cough, but otherwise denies any symptoms at this time. She denies any fevers/chills, dizziness, chest pain, abdominal pain, shortness of breath. States that she had a bagel at 2pm. All questions and concerns answered at the bedside.   Patient is Full Code  Past Medical History:  Diagnosis Date   Anemia    Anxiety    Arthritis    Blood transfusion    Bowel obstruction (HCC)    Colitis, ulcerative (HCC)    Colon polyps    DVT (deep venous thrombosis) (HCC)    Hyperlipidemia    Hypertension    Ileostomy  present (HCC)    Kidney disease    Numbness and tingling    hands and feet bilat    Pulmonary embolism (HCC)    Shortness of breath dyspnea    talking or walking    Type 2 diabetes mellitus with complication, without long-term current use of insulin  (HCC) 09/05/2015   currently on no medications, (05/26/2016) pt denies diabetes.  States that she had one time high blood sugars d/t prednisone     Past Surgical History:  Procedure Laterality Date   ABDOMINAL HYSTERECTOMY  2000   ABDOMINAL SURGERY     BREAST CYST ASPIRATION     CATARACT EXTRACTION W/PHACO Right 10/20/2017   Procedure: CATARACT EXTRACTION PHACO AND INTRAOCULAR LENS PLACEMENT (IOC);  Surgeon: Myrna Adine Anes, MD;  Location: [facility] ORS;  Service: Ophthalmology;  Laterality: Right;  US  00:50.1 AP% 13.2 CDE 6.61 Fluid Pack lot # [id number]   COLON SURGERY     colostomy   cortisol shot     in left shoulder   DILATION AND CURETTAGE OF UTERUS     most likely after miscarriage   ESOPHAGOGASTRODUODENOSCOPY N/A 05/03/2024   Procedure: EGD (ESOPHAGOGASTRODUODENOSCOPY);  Surgeon: Charlanne Groom, MD;  Location: [facility];  Service: Gastroenterology;  Laterality: N/A;   fibrocystic breast disease     q 6 month mammogram   gravida 6 para 2     all SAB   hyadiform mole     ILEO LOOP DIVERSION N/A 09/25/2015   Procedure: ILEO LOOP COLOSTOMY;  Surgeon: Bernarda Ned, MD;  Location: [facility];  Service: General;  Laterality: N/A;   ILEOSTOMY CLOSURE N/A 10/08/2015   Procedure: ILEOSTOMY REVISION;  Surgeon: Bernarda Ned, MD;  Location: [facility];  Service: General;  Laterality: N/A;   ILEOSTOMY CLOSURE N/A 08/20/2016   Procedure: OPEN RELOCATION OF ILEOSTOMY;  Surgeon: Bernarda Ned, MD;  Location: [facility];  Service: General;  Laterality: N/A;   IR GENERIC HISTORICAL  09/01/2016   IR US  GUIDE VASC ACCESS RIGHT 09/01/2016 Franky Rusk, PA-C [facility]   IR GENERIC HISTORICAL  09/01/2016   IR FLUORO GUIDE CV LINE RIGHT 09/01/2016 Franky Rusk, PA-C [facility]   KNEE ARTHROSCOPY W/ MENISCAL REPAIR     right knee ([place])   LAPAROSCOPIC SMALL BOWEL RESECTION N/A 09/25/2015   Procedure: LAPAROSCOPIC BOWEL RESECTION TIMES TWO;  Surgeon: Bernarda Ned, MD;  Location: [facility];  Service: General;  Laterality: N/A;   ROBOTIC ASSISTED LAPAROSCOPIC LYSIS OF ADHESION N/A 09/25/2015   Procedure: XI ROBOTIC ASSISTED LAPAROSCOPIC LYSIS OF ADHESION;  Surgeon: Bernarda Ned, MD;  Location: [facility];  Service: General;  Laterality: N/A;  90 minutes   Small bowel obstruction      Allergies: Betadine  [povidone iodine ], Latex, and Tape  Medications: Prior to Admission medications   Medication Sig Start Date End Date Taking? Authorizing Provider  ALPRAZolam  (XANAX ) 0.25 MG tablet Take 1 tablet (0.25 mg total) by mouth 2 (two) times daily as needed for anxiety. 03/06/24  Yes Joshua Ned CROME, MD  apixaban  (ELIQUIS ) 5 MG TABS tablet Take 1 tablet (5 mg total) by mouth 2 (two) times daily. 05/02/24  Yes Thayil, Irene T, PA-C  augmented betamethasone dipropionate (DIPROLENE-AF) 0.05 % cream Apply 1 Application topically 2 (two) times daily as needed (Dermatitis). 06/01/24  Yes [provider]  cholecalciferol (VITAMIN D3) 25 MCG (1000 UNIT) tablet Take 1,000 Units by mouth daily.   Yes [provider]  hydrocortisone  2.5 % cream APPLY TOPICALLY TWICE DAILY Patient taking differently: Apply 1 Application topically 2 (two) times daily as needed (Dermatitis). 05/17/24  Yes Joshua Ned CROME, MD  indapamide  (LOZOL ) 1.25 MG tablet TAKE 1 TABLET BY MOUTH DAILY 05/17/24  Yes Joshua Ned CROME, MD  losartan  (COZAAR ) 50 MG tablet Take 1 tablet (50 mg total) by mouth daily. 05/22/24  Yes Joshua Ned CROME, MD  Multiple Vitamin (MULTIVITAMIN WITH MINERALS) TABS tablet Take 1 tablet by mouth daily with breakfast.   Yes [provider]  nystatin  cream (MYCOSTATIN ) Apply 1 Application topically 2 (two) times daily. Patient taking differently:  Apply 1 Application topically 2 (two) times daily as needed for dry skin. 04/24/24  Yes [place], PA-C  pantoprazole  (PROTONIX ) 40 MG tablet Take 1 tablet (40 mg total) by mouth daily. 05/16/24  Yes [place], PA-C  potassium chloride  (KLOR-CON ) 10 MEQ tablet TAKE 1 TABLET BY MOUTH 3 TIMES  DAILY 05/17/24  Yes Joshua Ned CROME, MD     Family History  Problem Relation Age of Onset   Diabetes Mother    Breast cancer Mother 50   Diabetes Father    Heart disease Father    HIV Father    Diabetes Sister    Breast cancer Sister 61   Diverticulosis Sister    Throat cancer Sister    Diabetes Sister    Irritable bowel syndrome Sister    Diabetes Brother    Diverticulosis Brother    Diabetes Maternal Grandmother    Hyperlipidemia Daughter    Breast cancer Maternal Aunt    Colon cancer Paternal Aunt  Social History   Socioeconomic History   Marital status: Married    Spouse name: Not on file   Number of children: 2   Years of education: 18   Highest education level: Bachelor's degree (e.g., BA, AB, BS)  Occupational History   Occupation: Dispensing optician: [employer]    Comment: [employer]  Tobacco Use   Smoking status: Never    Passive exposure: Never   Smokeless tobacco: Never  Vaping Use   Vaping status: Never Used  Substance and Sexual Activity   Alcohol use: Yes    Alcohol/week: 2.0 standard drinks of alcohol    Types: 2 Standard drinks or equivalent per week    Comment: occ   Drug use: No   Sexual activity: Yes    Partners: Male  Other Topics Concern   Not on file  Social History Narrative   HSG, [employer] - BA, [employer] for med tech.  Married '75. 2 dtrs - '79, '82; 1 grandchild. Marriage is in good health. No abuse issues.    Social Drivers of Corporate investment banker Strain: Low Risk  (04/21/2024)   Overall Financial Resource Strain (CARDIA)    Difficulty of Paying Living Expenses: Not very hard  Food Insecurity: No Food Insecurity  (05/02/2024)   Hunger Vital Sign    Worried About Running Out of Food in the Last Year: Never true    Ran Out of Food in the Last Year: Never true  Transportation Needs: No Transportation Needs (05/02/2024)   PRAPARE - Administrator, Civil Service (Medical): No    Lack of Transportation (Non-Medical): No  Physical Activity: Insufficiently Active (04/21/2024)   Exercise Vital Sign    Days of Exercise per Week: 2 days    Minutes of Exercise per Session: 30 min  Stress: No Stress Concern Present (04/21/2024)   [employer] of Occupational Health - Occupational Stress Questionnaire    Feeling of Stress: Not at all  Social Connections: Socially Integrated (05/02/2024)   Social Connection and Isolation Panel    Frequency of Communication with Friends and Family: More than three times a week    Frequency of Social Gatherings with Friends and Family: Once a week    Attends Religious Services: More than 4 times per year    Active Member of [employer] or Organizations: Yes    Attends Engineer, structural: More than 4 times per year    Marital Status: Married  Recent Concern: Social Connections - Moderately Isolated (04/14/2024)   Social Connection and Isolation Panel    Frequency of Communication with Friends and Family: More than three times a week    Frequency of Social Gatherings with Friends and Family: More than three times a week    Attends Religious Services: Never    Database administrator or Organizations: No    Attends Banker Meetings: Never    Marital Status: Married     Review of Systems  Respiratory:  Positive for cough.   Patient denies any headache, chest pain, shortness of breath, abdominal pain, N/V, or fever/chills. All other systems are negative.   Vital Signs: BP 104/61   Pulse 70   Temp 98.1 F (36.7 C) (Oral)   Resp 18   SpO2 100%    Physical Exam Vitals reviewed.  HENT:     Mouth/Throat:     Mouth: Mucous membranes are moist.      Pharynx: Oropharynx is clear.  Cardiovascular:     Rate and Rhythm: Normal rate and regular rhythm.     Heart sounds: Normal heart sounds.  Pulmonary:     Effort: Pulmonary effort is normal.     Breath sounds: Normal breath sounds.  Abdominal:     General: Abdomen is flat.     Palpations: Abdomen is soft.     Tenderness: There is no abdominal tenderness.     Comments: Ostomy in place w/ minimal output  Skin:    General: Skin is warm and dry.  Neurological:     Mental Status: She is alert and oriented to person, place, and time.  Psychiatric:        Behavior: Behavior normal.     Imaging: CT Angio Abd/Pel W and/or Wo Contrast Result Date: 06/23/2024 CLINICAL DATA:  Lower gastrointestinal bleeding. EXAM: CTA ABDOMEN AND PELVIS WITHOUT AND WITH CONTRAST TECHNIQUE: Multidetector CT imaging of the abdomen and pelvis was performed using the standard protocol during bolus administration of intravenous contrast. Multiplanar reconstructed images and MIPs were obtained and reviewed to evaluate the vascular anatomy. RADIATION DOSE REDUCTION: This exam was performed according to the departmental dose-optimization program which includes automated exposure control, adjustment of the mA and/or kV according to patient size and/or use of iterative reconstruction technique. CONTRAST:  80mL OMNIPAQUE  IOHEXOL  350 MG/ML SOLN COMPARISON:  January 15, 2020. FINDINGS: VASCULAR Aorta: Atherosclerosis of abdominal aorta without aneurysm or dissection. Celiac: Patent without evidence of aneurysm, dissection, vasculitis or significant stenosis. SMA: Patent without evidence of aneurysm, dissection, vasculitis or significant stenosis. Renals: Both renal arteries are patent without evidence of aneurysm, dissection, vasculitis, fibromuscular dysplasia or significant stenosis. IMA: Not definitively visualized. Inflow: Patent without evidence of aneurysm, dissection, vasculitis or significant stenosis. Proximal Outflow:  Bilateral common femoral and visualized portions of the superficial and profunda femoral arteries are patent without evidence of aneurysm, dissection, vasculitis or significant stenosis. Veins: No obvious venous abnormality within the limitations of this arterial phase study. Review of the MIP images confirms the above findings. NON-VASCULAR Lower chest: No acute abnormality. Hepatobiliary: No focal liver abnormality is seen. No gallstones, gallbladder wall thickening, or biliary dilatation. Pancreas: Unremarkable. No pancreatic ductal dilatation or surrounding inflammatory changes. Spleen: Normal in size without focal abnormality. Adrenals/Urinary Tract: Adrenal glands appear normal. Right renal cyst is noted. No hydronephrosis or renal obstruction is noted. Urinary bladder is unremarkable. Stomach/Bowel: The stomach is unremarkable. Status post total colectomy. Ileostomy is noted in right lower quadrant. There is no evidence of bowel obstruction or inflammation. Dilated segment of small bowel is seen in area of surgical anastomosis. There does appear to be accumulation of high density material in this area on later images suggesting possible small focus of gastrointestinal bleeding. This is best seen on image number 58 of series 18. Lymphatic: No adenopathy is noted. Reproductive: Status post hysterectomy. No adnexal masses. Other: Small infraumbilical ventral hernia is noted which contains a loop of small bowel, but does not result in obstruction. No ascites. Musculoskeletal: No acute or significant osseous findings. IMPRESSION: VASCULAR Probable accumulation of high density material seen in dilated segment of small bowel in pelvis near surgical anastomosis. This is concerning for possible focus of gastrointestinal bleeding. Critical Value/emergent results were called by telephone at the time of interpretation on 06/23/2024 at 12:57 pm to provider [facility] , who verbally acknowledged these results. Aortic  Atherosclerosis (ICD10-I70.0). 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 Small infraumbilical ventral hernia is noted. Electronically Signed  By: Lynwood Landy Raddle M.D.   On: 06/23/2024 12:57    Labs:  CBC: Recent Labs    05/05/24 0538 05/05/24 1627 05/23/24 1104 06/08/24 1621 06/20/24 0945 06/23/24 1107 06/23/24 1536  WBC 12.7*  --  9.5  --  6.8 8.3  --   HGB 8.0*   < > 10.7* 10.9* 11.4* 10.2* 9.0*  HCT 25.2*   < > 32.6*  --  34.5* 31.4* 27.6*  PLT 332  --  364  --  346 366  --    < > = values in this interval not displayed.    COAGS: Recent Labs    05/02/24 1931 05/04/24 0138 05/04/24 0957 05/04/24 2125 05/05/24 0538 05/05/24 1627  INR 1.1  --   --   --   --   --   APTT 29   < > 136* 115* 146* 91*   < > = values in this interval not displayed.    BMP: Recent Labs    05/03/24 0045 05/23/24 1104 06/20/24 0945 06/23/24 1107  NA 136 139 138 138  K 3.5 3.7 3.8 3.2*  CL 99 101 103 102  CO2 27 33* 28 26  GLUCOSE 104* 91 90 104*  BUN 20 22 19  26*  CALCIUM  9.1 9.5 9.2 9.4  CREATININE 0.93 1.22* 1.20* 1.06*  GFRNONAA >60 48* 49* 57*    LIVER FUNCTION TESTS: Recent Labs    05/02/24 1931 05/23/24 1104 06/20/24 0945 06/23/24 1107  BILITOT 0.6 0.6 0.4 0.6  AST 21 14* 15 20  ALT 15 10 9 11   ALKPHOS 64 69 86 79  PROT 6.5 7.4 7.4 7.2  ALBUMIN  3.2* 3.9 3.9 3.2*    TUMOR MARKERS: No results for input(s): AFPTM, CEA, CA199, CHROMGRNA in the last 8760 hours.  Assessment and Plan:  Melena per ileostomy w/ syncope: Latasha Wood is a 68 y.o. female who underwent ileoscopy yesterday and presented to the ED this morning with concerns of blood in her ostomy bag. Imaging concerning for GI bleed and patient with subsequent syncopal event requiring resuscitation with IV fluids and blood transfusion. IR consulted for angiogram with possible embolization. Procedure to be performed under moderate sedation.  -NPO since 2pm   -LD Eliquis  last night  -H/H 9.0/27.6; patient actively receiving transfusion -Plan for angiogram with possible embolization this evening    Thank you for allowing our service to participate in [name] 's care.    Electronically Signed: Glennon CHRISTELLA Bal, PA-C   06/23/2024, 4:19 PM     I spent a total of 55 Miinutes in face to face in clinical consultation, greater than 50% of which was counseling/coordinating care for angiogram with possible embolization.

## 2024-06-23 NOTE — Telephone Encounter (Signed)
 I called the patient this morning to check on her.  Dr. Avram just made me aware that she called in this morning with bleeding symptoms and he advised her to go to the emergency room.  She sent a picture showing bright red blood in the ostomy.  She had a diagnostic ileoscopy yesterday, short exam, no obvious pathology in the distal ileum.  No biopsies taken.  She remained on Eliquis  for it.  She started having some red blood in the ostomy last night and again this morning.  She is not in any pain.  We discussed the situation.  This is different from when she had bleeding that hospitalized her at the end of June when she had frank melena.  This seems more superficial to her.  I asked if she took off her ostomy bag or the base of the ostomy to assess if there is superficial trauma to the entrance of the ostomy.  This could be the source of her symptoms.  She has not done that yet and recommend she do it now.  She was advised with any significant bleeding, especially if this persists this AM, to go to the emergency room.  She was told earlier this morning to go to the emergency room by Dr. Avram, and she states she frankly does not want to go there right now, she is concerned about cost / bill and plans on holding her Eliquis  and taking a look at her ostomy.  If she finds a clear source superficially that bleeding I will try to get her into see the surgeon for management or her ostomy nurse today.  She will do that now.  I told her if this persists she needs to go to the hospital.  Again, she is declining that right now.  She does want to come to the lab for hemoglobin draw and if any significant anemia she would be more willing to go to the hospital.  I encouraged her to do that ASAP, I will order it now. She will hold Eliquis  for now while this is being sorted out and I will call her back this morning or lunch hour to check on her.  Again I was very clear to go to the emergency room with bleeding that is  persisting, but she is declining that right now.  She understands.

## 2024-06-23 NOTE — ED Provider Notes (Signed)
 [place] EMERGENCY DEPARTMENT AT [facility] Provider Note   CSN: [id number] Arrival date & time: 06/23/24  1022     Patient presents with: Rectal Bleeding   Latasha Wood is a 68 y.o. female.   68 year old female with prior medical history as detailed below presents with complaint of bloody stool in ileostomy bag.  Patient had ileoscopy yesterday.  No biopsy taken.  Patient is on Eliquis .  Patient noted bloody stool this morning.  She has photos on her phone of dark tarry stool mixed with bright red blood.  No vomiting.  No pain reported.  No fever reported.  Last dose of Eliquis  taken was yesterday evening.  The history is provided by the patient and medical records.       Prior to Admission medications   Medication Sig Start Date End Date Taking? Authorizing Provider  ALPRAZolam  (XANAX ) 0.25 MG tablet Take 1 tablet (0.25 mg total) by mouth 2 (two) times daily as needed for anxiety. 03/06/24   Joshua Debby CROME, MD  apixaban  (ELIQUIS ) 5 MG TABS tablet Take 1 tablet (5 mg total) by mouth 2 (two) times daily. 05/02/24   Thayil, Irene T, PA-C  augmented betamethasone dipropionate (DIPROLENE-AF) 0.05 % cream SMARTSIG:sparingly Topical Twice Daily PRN 06/01/24   [provider]  cholecalciferol (VITAMIN D3) 25 MCG (1000 UNIT) tablet Take 1,000 Units by mouth daily.    [provider]  hydrocortisone  2.5 % cream APPLY TOPICALLY TWICE DAILY 05/17/24   Joshua Debby CROME, MD  indapamide  (LOZOL ) 1.25 MG tablet TAKE 1 TABLET BY MOUTH DAILY 05/17/24   Joshua Debby CROME, MD  losartan  (COZAAR ) 50 MG tablet Take 1 tablet (50 mg total) by mouth daily. 05/22/24   Joshua Debby CROME, MD  Multiple Vitamin (MULTIVITAMIN WITH MINERALS) TABS tablet Take 1 tablet by mouth daily with breakfast.    [provider]  nystatin  cream (MYCOSTATIN ) Apply 1 Application topically 2 (two) times daily. 04/24/24   [place], PA-C  pantoprazole  (PROTONIX ) 40 MG tablet Take 1 tablet (40  mg total) by mouth daily. 05/16/24   [place], PA-C  potassium chloride  (KLOR-CON ) 10 MEQ tablet TAKE 1 TABLET BY MOUTH 3 TIMES  DAILY 05/17/24   Joshua Debby CROME, MD    Allergies: Betadine  [povidone iodine ], Latex, and Tape    Review of Systems  All other systems reviewed and are negative.   Updated Vital Signs BP (!) 131/90 (BP Location: Left Arm)   Pulse 97   Temp 99 F (37.2 C) (Oral)   Resp 18   SpO2 98%   Physical Exam Vitals and nursing note reviewed.  Constitutional:      General: She is not in acute distress.    Appearance: Normal appearance. She is well-developed.  HENT:     Head: Normocephalic and atraumatic.  Eyes:     Conjunctiva/sclera: Conjunctivae normal.     Pupils: Pupils are equal, round, and reactive to light.  Cardiovascular:     Rate and Rhythm: Normal rate and regular rhythm.     Heart sounds: Normal heart sounds.  Pulmonary:     Effort: Pulmonary effort is normal. No respiratory distress.     Breath sounds: Normal breath sounds.  Abdominal:     General: There is no distension.     Palpations: Abdomen is soft.     Tenderness: There is no abdominal tenderness.     Comments: Dark tarry stool with blood in colostomy bag.  Musculoskeletal:  General: No deformity. Normal range of motion.     Cervical back: Normal range of motion and neck supple.  Skin:    General: Skin is warm and dry.  Neurological:     General: No focal deficit present.     Mental Status: She is alert and oriented to person, place, and time.     (all labs ordered are listed, but only abnormal results are displayed) Labs Reviewed  COMPREHENSIVE METABOLIC PANEL WITH GFR  CBC  POC OCCULT BLOOD, ED  TYPE AND SCREEN    EKG: None  Radiology: No results found.   Procedures   Medications Ordered in the ED - No data to display  Clinical Course as of 06/23/24 1308  Fri Jun 23, 2024  1308 CT Angio Abd/Pel W and/or Wo Contrast [PM]    Clinical Course User  Index [PM] Laurice Maude BROCKS, MD                                 Medical Decision Making Patient is on Eliquis  and presents with dark tarry stool and blood in colostomy bag.  Ileoscopy performed yesterday.  No biopsies were performed.  Patient last took Eliquis  yesterday evening.  Hemodynamics are stable.  Initial hemoglobin is 10.2.  [facility] is aware of case and will evaluate in consultation.  Hospitalist service made aware of case and will evaluate for admission.    Amount and/or Complexity of Data Reviewed Labs: ordered. Radiology: ordered. Decision-making details documented in ED Course.  Risk Prescription drug management. Decision regarding hospitalization.   CRITICAL CARE Performed by: Maude BROCKS Laurice   Total critical care time: 30 minutes  Critical care time was exclusive of separately billable procedures and treating other patients.  Critical care was necessary to treat or prevent imminent or life-threatening deterioration.  Critical care was time spent personally by me on the following activities: development of treatment plan with patient and/or surrogate as well as nursing, discussions with consultants, evaluation of patient's response to treatment, examination of patient, obtaining history from patient or surrogate, ordering and performing treatments and interventions, ordering and review of laboratory studies, ordering and review of radiographic studies, pulse oximetry and re-evaluation of patient's condition.      Final diagnoses:  Gastrointestinal hemorrhage, unspecified gastrointestinal hemorrhage type    ED Discharge Orders     None          Laurice Maude BROCKS, MD 06/23/24 1312

## 2024-06-23 NOTE — Consult Note (Signed)
 Consultation  Referring Provider:   Dr. Laurice Primary Care Physician:  Latasha Debby CROME, MD Primary Gastroenterologist:  Dr. Leigh       Reason for Consultation:   Melenic/bright red bleeding in ileostomy bag  DOA: 06/23/2024         Hospital Day: 1         HPI:   Latasha Wood is a 68 y.o. female with past medical history significant for ulcerative pancolitis status post elective colectomy November 2016 with postoperative complications of fistula with 2 revisions, high ostomy output, SBO, GI bleed, June 2025 DVT/PE started on Eliquis .  June 2025 after initiating Eliquis  patient had anemia with melena.  EGD with Dr. Charlanne 05/03/2024 unremarkable.  CTA at that time was unremarkable.  Bleeding stopped on its own and she was continued on Eliquis .   Patient was seen in the office 06/08/2024 complaining of occasional bright red blood in the ostomy.  patient underwent ileostomy with Dr. Leigh 06/22/2024 on Eliquis  due to recent DVT/PE and this showed ileostomy appeared normal no superficial breakdown, ileum appeared normal no inflammation no cause of bleeding noted.  Patient notified our office this morning that around 9 PM yesterday she had large volume of blood in her iliostomy several episodes of appetite.  Also sent pictures 1 was just small amount of dark to bright red blood on tissue paper the other was more dark red blood with some clots in the toilet. Patient proceeded to the ER. She has had heavy output from her ileostomy despite not eating anything. Has emptied her bag 8 times last night/this AM with dark/bright red stool/liquid. She states last time was an hour ago and she continues to have dark red/BRB liquid in her ostomy.  She is on pantoprazole  for her GERD, no GERD, vomiting, one brief episode of nausea last night. Denies AB pain but does have slight cramping around the ileostomy.  No rashes, has OA but no joint pain.  Last dose of eliquis  930 last night.   Patient states she has had worsening DOE last week, she is scheduled for iron  infusion Tuesday and the following Tuesday with hematology. No chest pain. No cough, fever, chills. No worsening leg swelling.  No NSAIDS, no ETOH, no tobacco use.  Hemodynamically stable in the ER Work up notable for Hgb 10.2 down from 11.48/12 however patient's baseline appears to be 11-12 other than when she was hospitalized in June for melena. Potassium 3.2, BUN 26 creatinine 1.06 no BUN isolation, albumin  3.2, liver function normal CT angio shows probable accumulation of high density material seen in dilated segment of small bowel and pelvis near surgical anastomosis concern for possible focus of gastrointestinal bleeding, aortic atherosclerosis, status post total colectomy with high lower quadrant ileostomy and infraumbilical ventral hernia.   Abnormal ED labs: Abnormal Labs Reviewed  COMPREHENSIVE METABOLIC PANEL WITH GFR - Abnormal; Notable for the following components:      Result Value   Potassium 3.2 (*)    Glucose, Bld 104 (*)    BUN 26 (*)    Creatinine, Ser 1.06 (*)    Albumin  3.2 (*)    GFR, Estimated 57 (*)    All other components within normal limits  CBC - Abnormal; Notable for the following components:   RBC 3.73 (*)    Hemoglobin 10.2 (*)    HCT 31.4 (*)    All other components within normal limits    Past Medical History:  Diagnosis Date  Anemia    Anxiety    Arthritis    Blood transfusion    Bowel obstruction (HCC)    Colitis, ulcerative (HCC)    Colon polyps    DVT (deep venous thrombosis) (HCC)    Hyperlipidemia    Hypertension    Ileostomy present (HCC)    Kidney disease    Numbness and tingling    hands and feet bilat    Pulmonary embolism (HCC)    Shortness of breath dyspnea    talking or walking    Type 2 diabetes mellitus with complication, without long-term current use of insulin  (HCC) 09/05/2015   currently on no medications, (05/26/2016) pt denies diabetes.   States that she had one time high blood sugars d/t prednisone     Surgical History:  She  has a past surgical history that includes gravida 6 para 2; hyadiform mole; Dilation and curettage of uterus; Knee arthroscopy w/ meniscal repair; fibrocystic breast disease; Abdominal hysterectomy (2000); Small bowel obstruction; Robotic assisted laparoscopic lysis of adhesion (N/A, 09/25/2015); Laparoscopic small bowel resection (N/A, 09/25/2015); Ileo loop diversion (N/A, 09/25/2015); Ileostomy closure (N/A, 10/08/2015); Abdominal surgery; Colon surgery; cortisol shot; Ileostomy closure (N/A, 08/20/2016); ir generic historical (09/01/2016); ir generic historical (09/01/2016); Cataract extraction w/PHACO (Right, 10/20/2017); Breast cyst aspiration; and Esophagogastroduodenoscopy (N/A, 05/03/2024). Family History:  Her family history includes Breast cancer in her maternal aunt; Breast cancer (age of onset: 35) in her sister; Breast cancer (age of onset: 35) in her mother; Colon cancer in her paternal aunt; Diabetes in her brother, father, maternal grandmother, mother, sister, and sister; Diverticulosis in her brother and sister; HIV in her father; Heart disease in her father; Hyperlipidemia in her daughter; Irritable bowel syndrome in her sister; Throat cancer in her sister. Social History:   reports that she has never smoked. She has never been exposed to tobacco smoke. She has never used smokeless tobacco. She reports current alcohol use of about 2.0 standard drinks of alcohol per week. She reports that she does not use drugs.  Prior to Admission medications   Medication Sig Start Date End Date Taking? Authorizing Provider  ALPRAZolam  (XANAX ) 0.25 MG tablet Take 1 tablet (0.25 mg total) by mouth 2 (two) times daily as needed for anxiety. 03/06/24  Yes Latasha Debby CROME, MD  apixaban  (ELIQUIS ) 5 MG TABS tablet Take 1 tablet (5 mg total) by mouth 2 (two) times daily. 05/02/24  Yes Thayil, Irene T, PA-C  augmented  betamethasone dipropionate (DIPROLENE-AF) 0.05 % cream Apply 1 Application topically 2 (two) times daily as needed (Dermatitis). 06/01/24  Yes [provider]  cholecalciferol (VITAMIN D3) 25 MCG (1000 UNIT) tablet Take 1,000 Units by mouth daily.   Yes [provider]  hydrocortisone  2.5 % cream APPLY TOPICALLY TWICE DAILY Patient taking differently: Apply 1 Application topically 2 (two) times daily as needed (Dermatitis). 05/17/24  Yes Latasha Debby CROME, MD  indapamide  (LOZOL ) 1.25 MG tablet TAKE 1 TABLET BY MOUTH DAILY 05/17/24  Yes Latasha Debby CROME, MD  losartan  (COZAAR ) 50 MG tablet Take 1 tablet (50 mg total) by mouth daily. 05/22/24  Yes Latasha Debby CROME, MD  Multiple Vitamin (MULTIVITAMIN WITH MINERALS) TABS tablet Take 1 tablet by mouth daily with breakfast.   Yes [provider]  nystatin  cream (MYCOSTATIN ) Apply 1 Application topically 2 (two) times daily. Patient taking differently: Apply 1 Application topically 2 (two) times daily as needed for dry skin. 04/24/24  Yes [place], PA-C  pantoprazole  (PROTONIX ) 40 MG tablet Take 1  tablet (40 mg total) by mouth daily. 05/16/24  Yes [place], PA-C  potassium chloride  (KLOR-CON ) 10 MEQ tablet TAKE 1 TABLET BY MOUTH 3 TIMES  DAILY 05/17/24  Yes Latasha Debby CROME, MD    No current facility-administered medications for this encounter.   Current Outpatient Medications  Medication Sig Dispense Refill   ALPRAZolam  (XANAX ) 0.25 MG tablet Take 1 tablet (0.25 mg total) by mouth 2 (two) times daily as needed for anxiety. 180 tablet 0   apixaban  (ELIQUIS ) 5 MG TABS tablet Take 1 tablet (5 mg total) by mouth 2 (two) times daily. 360 tablet 0   augmented betamethasone dipropionate (DIPROLENE-AF) 0.05 % cream Apply 1 Application topically 2 (two) times daily as needed (Dermatitis).     cholecalciferol (VITAMIN D3) 25 MCG (1000 UNIT) tablet Take 1,000 Units by mouth daily.     hydrocortisone  2.5 % cream APPLY TOPICALLY TWICE DAILY  (Patient taking differently: Apply 1 Application topically 2 (two) times daily as needed (Dermatitis).) 120 g 0   indapamide  (LOZOL ) 1.25 MG tablet TAKE 1 TABLET BY MOUTH DAILY 100 tablet 2   losartan  (COZAAR ) 50 MG tablet Take 1 tablet (50 mg total) by mouth daily. 90 tablet 1   Multiple Vitamin (MULTIVITAMIN WITH MINERALS) TABS tablet Take 1 tablet by mouth daily with breakfast.     nystatin  cream (MYCOSTATIN ) Apply 1 Application topically 2 (two) times daily. (Patient taking differently: Apply 1 Application topically 2 (two) times daily as needed for dry skin.) 60 g 1   pantoprazole  (PROTONIX ) 40 MG tablet Take 1 tablet (40 mg total) by mouth daily. 90 tablet 3   potassium chloride  (KLOR-CON ) 10 MEQ tablet TAKE 1 TABLET BY MOUTH 3 TIMES  DAILY 270 tablet 0    Allergies as of 06/23/2024 - Review Complete 06/23/2024  Allergen Reaction Noted   Betadine  [povidone iodine ] Rash and Other (See Comments) 10/12/2017   Latex Rash and Other (See Comments) 08/12/2016   Tape Rash and Other (See Comments) 08/12/2016    Review of Systems:    Constitutional: No weight loss, fever, chills, weakness or fatigue HEENT: Eyes: No change in vision               Ears, Nose, Throat:  No change in hearing or congestion Skin: No rash or itching Cardiovascular: No chest pain, chest pressure or palpitations   Respiratory: No SOB or cough Gastrointestinal: See HPI and otherwise negative Genitourinary: No dysuria or change in urinary frequency Neurological: No headache, dizziness or syncope Musculoskeletal: No new muscle or joint pain Hematologic: No bleeding or bruising Psychiatric: No history of depression or anxiety     Physical Exam:  Vital signs in last 24 hours: Temp:  [97.7 F (36.5 C)-99 F (37.2 C)] 99 F (37.2 C) (08/15 1028) Pulse Rate:  [52-97] 74 (08/15 1245) Resp:  [11-18] 17 (08/15 1245) BP: (105-137)/(67-90) 107/68 (08/15 1245) SpO2:  [96 %-100 %] 99 % (08/15 1245) Weight:  [76.7 kg]  76.7 kg (08/14 1443)   Last BM recorded by nurses in past 5 days No data recorded  General:   Pleasant, well developed female in no acute distress Head:  Normocephalic and atraumatic. Eyes: sclerae anicteric,conjunctive pink  Heart:  regular rate and rhythm, no murmurs or gallops Pulm: Clear anteriorly; no wheezing Abdomen:  Soft, Obese AB, Active bowel sounds. mild tenderness at right upper quadrant ostomy site. Without guarding and Without rebound, No organomegaly appreciated. Extremities:  Without edema. Msk:  Symmetrical without gross deformities. Peripheral pulses  intact.  Neurologic:  Alert and  oriented x4;  No focal deficits.  Skin:   Dry and intact without significant lesions or rashes. Psychiatric:  Cooperative. Normal mood and affect.  LAB RESULTS: Recent Labs    06/23/24 1107  WBC 8.3  HGB 10.2*  HCT 31.4*  PLT 366   BMET Recent Labs    06/23/24 1107  NA 138  K 3.2*  CL 102  CO2 26  GLUCOSE 104*  BUN 26*  CREATININE 1.06*  CALCIUM  9.4   LFT Recent Labs    06/23/24 1107  PROT 7.2  ALBUMIN  3.2*  AST 20  ALT 11  ALKPHOS 79  BILITOT 0.6   PT/INR No results for input(s): LABPROT, INR in the last 72 hours.  STUDIES: CT Angio Abd/Pel W and/or Wo Contrast Result Date: 06/23/2024 CLINICAL DATA:  Lower gastrointestinal bleeding. EXAM: CTA ABDOMEN AND PELVIS WITHOUT AND WITH CONTRAST TECHNIQUE: Multidetector CT imaging of the abdomen and pelvis was performed using the standard protocol during bolus administration of intravenous contrast. Multiplanar reconstructed images and MIPs were obtained and reviewed to evaluate the vascular anatomy. RADIATION DOSE REDUCTION: This exam was performed according to the departmental dose-optimization program which includes automated exposure control, adjustment of the mA and/or kV according to patient size and/or use of iterative reconstruction technique. CONTRAST:  80mL OMNIPAQUE  IOHEXOL  350 MG/ML SOLN COMPARISON:  January 15, 2020. FINDINGS: VASCULAR Aorta: Atherosclerosis of abdominal aorta without aneurysm or dissection. Celiac: Patent without evidence of aneurysm, dissection, vasculitis or significant stenosis. SMA: Patent without evidence of aneurysm, dissection, vasculitis or significant stenosis. Renals: Both renal arteries are patent without evidence of aneurysm, dissection, vasculitis, fibromuscular dysplasia or significant stenosis. IMA: Not definitively visualized. Inflow: Patent without evidence of aneurysm, dissection, vasculitis or significant stenosis. Proximal Outflow: Bilateral common femoral and visualized portions of the superficial and profunda femoral arteries are patent without evidence of aneurysm, dissection, vasculitis or significant stenosis. Veins: No obvious venous abnormality within the limitations of this arterial phase study. Review of the MIP images confirms the above findings. NON-VASCULAR Lower chest: No acute abnormality. Hepatobiliary: No focal liver abnormality is seen. No gallstones, gallbladder wall thickening, or biliary dilatation. Pancreas: Unremarkable. No pancreatic ductal dilatation or surrounding inflammatory changes. Spleen: Normal in size without focal abnormality. Adrenals/Urinary Tract: Adrenal glands appear normal. Right renal cyst is noted. No hydronephrosis or renal obstruction is noted. Urinary bladder is unremarkable. Stomach/Bowel: The stomach is unremarkable. Status post total colectomy. Ileostomy is noted in right lower quadrant. There is no evidence of bowel obstruction or inflammation. Dilated segment of small bowel is seen in area of surgical anastomosis. There does appear to be accumulation of high density material in this area on later images suggesting possible small focus of gastrointestinal bleeding. This is best seen on image number 58 of series 18. Lymphatic: No adenopathy is noted. Reproductive: Status post hysterectomy. No adnexal masses. Other: Small infraumbilical  ventral hernia is noted which contains a loop of small bowel, but does not result in obstruction. No ascites. Musculoskeletal: No acute or significant osseous findings. IMPRESSION: VASCULAR Probable accumulation of high density material seen in dilated segment of small bowel in pelvis near surgical anastomosis. This is concerning for possible focus of gastrointestinal bleeding. Critical Value/emergent results were called by telephone at the time of interpretation on 06/23/2024 at 12:57 pm to provider [facility] , who verbally acknowledged these results. Aortic Atherosclerosis (ICD10-I70.0). 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 Small infraumbilical ventral hernia is  noted. Electronically Signed   By: Lynwood Landy Raddle M.D.   On: 06/23/2024 12:57      Impression    68 year old female with history of ulcerative colitis status post proctocolectomy with ileostomy presenting with blood in ileostomy bag and acute on chronic anemia in setting of Eliquis  use This is patient's second GI bleed first time was in June requiring 1 PRBC and bleeding stopped on its own with a negative CTA and unremarkable endoscopy. Patient had unremarkable ileostomy with Dr. Leigh 8/14 and subsequent bleeding last night into her ileostomy, HD stable CTA shows probable accumulation of high density material seen in dilated segment of small bowel and pelvis near surgical anastomosis possible focal gastrointestinal bleeding I consulted IR and discussed the case with Dr.Yamagata who states that the CTA appears to be more of a slow gastrointestinal bleed.  Uncertain if IR would be beneficial at this time, also mentions with multiple previous surgeries would be a difficult case.   I also discussed case with Dr. Leigh and Dr. Stacia, Dr. Luverne believes the bleeding is about 40 cm from ostomy which Dr. Leigh was unable to reach with previous ileostomy.  We are uncertain if would be  able to reach the questionable site of bleeding but after discussion with the patient and continuing dark/bloody bowel movements and her ostomy she is willing to proceed. Patient had bagel earlier today so will be unable to proceed today with ileostomy --Clear liquid diet today, n.p.o. at 6 AM, complete one half MiraLAX  prep starting at 5 AM,  needs to be n.p.o. 3 hours prior to ileostomy scheduled for 1130. -Continue to hold Eliquis  --I have discussed the risks of bleeding, infection, perforation, medication reactions, and remote risk of death associated with ileostomy. All questions were answered and the patient acknowledges these risk and wishes to proceed. -- If at any point patient has acute bleeding, becomes hemodynamically unstable, significant anemia please consult IR and repeat stat CTA -- Patient high risk for capsule endoscopy due to history of small bowel obstructions and multiple surgeries - Will check hemoglobin now and again in 6 hours.  Repeat CBC in the morning. - Consider IV iron  in the hospital but patient is scheduled outpatient Tuesday with hematology.  Acute on chronic anemia, stable at this time likely secondary to anastomotic bleed 06/23/2024  HGB 10.2 MCV 84.2 Platelets 366 06/20/2024 Iron  45 Ferritin 15 B12 397 Recent Labs    05/03/24 1419 05/03/24 2156 05/04/24 0138 05/04/24 1349 05/05/24 0538 05/05/24 1627 05/23/24 1104 06/08/24 1621 06/20/24 0945 06/23/24 1107  HGB 8.3* 9.2* 9.4* 8.9* 8.0* 9.3* 10.7* 10.9* 11.4* 10.2*  Patient scheduled outpatient with hematology Tuesday for IV iron   Unprovoked PE/DVT April 20 2024 Hypercoagulable workup negative, following with Dr. Federico hematology On eliquis  last dose yesterday evening Continue to hold at this time Difficult situation as this is patient's second GI bleed since initiation of Eliquis , first time June 26 negative CTA, status post 1 unit PRBC.  EGD unremarkable.  Now continuing dark red blood through  ileostomy. -Continue to hold Eliquis  for now, with unprovoked PE patient will likely be restarted, recommendations per Dr. Stacia -Consider IVC  Stage III kidney disease with AKI Monitor, no isolated BUN elevation  Hypokalemia Replace per primary, ideally wanted above 3.5 for procedures  Principal Problem:   GI bleed Active Problems:   Stage 3a chronic kidney disease (HCC)    LOS: 0 days    Thank you for your kind consultation, we will continue  to follow.   Alan JONELLE Coombs  06/23/2024, 1:43 PM

## 2024-06-23 NOTE — Consult Note (Signed)
 Latasha Wood, MRN:  [id number], DOB:  1955-11-13, LOS: 0 ADMISSION DATE:  06/23/2024, CONSULTATION DATE:  8/15 REFERRING MD:  Earley, CHIEF COMPLAINT:  hemorrhagic shock    History of Present Illness:  68 year old female patient with a complicated history of ulcerative pancolitis initially diagnosed at age 83 and status post initial colectomy in 2016, which has been complicated by postoperative fistulas, small bowel obstruction as well as high ostomy output which had been fully cleared over time.  Recently admitted June 12 through June 15 for pulmonary emboli and DVT discharged to home on Eliquis .  Readmitted June 24 through June 27 for dark-colored ostomy output.  Hemoglobin on presentation that time was 6.8 she had a CT angiogram at that point which was not diagnostic.  The bleeding subsided without intervention and she was eventually placed back on her DOAC and discharged home  Went to [facility] on 8/14 for ileoscopy to evaluate recurrent bleeding.  The ileostomy appeared normal on this exam there was no superficial breakdown there is no inflammation and obvious cause for bleeding  Later that evening the patient once again reported bloody output from the ileostomy.  Her nighttime Eliquis  was held and she was referred to the Emergency room.  She continued to have multiple episodes of bloody output in her ileostomy bag, reporting 9 episodes.  Was starting to feel dizzy, so presented to the emergency room.  Initially in the ER pulse 97 blood pressure 131/90.  Hemoglobin was 10 2 hematocrit 31.4 platelet count 366, potassium 3.2 BUN 26 creatinine 1.06 a CT angiogram of abdomen pelvis was obtained showed a probable accumulation of high density material seen and dilated segment of the small bowel near the surgical anastomosis site.  This was felt possibly the focal point of gastrointestinal bleeding subsequently again seen by gastroenterology in consult at the request of the ER  team.  Initial plans were to continue on IV hydration, type and cross, and monitor the patient in the intensive care.  With recommendations to reverse in a anticoagulation, and also consulted interventional radiology.  Shortly after arrival to the medical floor the patient became unresponsive with palpable blood pressure recorded in the 40s.  She was administered rapid crystalloid resuscitation, her first unit of blood was hung, and she was placed in reverse Trendelenburg.  Subsequently transferred to the floor, critical care services asked to evaluate and assume care.  Interventional radiology already aware of clinical decline with plan to bring to angiogram suite for possible embolization  Pertinent  Medical History  Ulcerative pancolitis status post an elective colectomy back in November 2016 this has been complicated by postoperative fistulas requiring revision x 2, high ostomy output, small bowel obstruction, and prior GI bleed.  Just recently diagnosed and placed on DOAC for DVT/PE. Hyperlipidemia Hypertension CKD Dyspnea Prior abdominal hysterectomy Significant Hospital Events: Including procedures, antibiotic start and stop dates in addition to other pertinent events   8/15 admitted w/bloody output from ileostomy (several episodes) initially hemodynamically stable w/ Hgb >10. Had on-going bloody output. On arrival to medical floor had acute LOC w/ SBP in 40s. 2 liters crystalloid administered. Blood started. Got K centra, IV TXA, left femoral cortis placed. IR consulted and plan to go to angiogram    Interim History / Subjective:  CP better w/ blood and oxygen   Objective    Blood pressure 104/61, pulse 70, temperature 98.1 F (36.7 C), temperature source Oral, resp. rate 18, SpO2 100%.  No intake or output data in the 24 hours ending 06/23/24 1614 There were no vitals filed for this visit.  Examination: General: This is a 68 year old female she is lying in bed not currently  in acute distress however initially was fairly lethargic on arrival to the intensive care HENT: Sclera nonicteric mucous membranes pale no JVD Lungs: Clear, diminished bases no accessory use Cardiovascular: Regular rate and rhythm without murmur rub or gallop.  Point-of-care ultrasound assessment showed good ventricular function, there was fairly significant respiratory variation noted on IVC assessment Abdomen: Soft denies pain ileostomy draining dark red/maroon-colored stool/blood Extremities: Now warm, pulses are palpable Neuro: Oriented GU: Due to void  Resolved problem list   Assessment and Plan  Hemorrhagic shock with syncopal episode secondary to recurrent upper GI bleed, complicated by patient with history of ulcerative colitis, prior GI bleeding, and current DOAC use Point of concern identified by CT angio and felt to be around area of prior small bowel surgical anastomosis site.  Her hemoglobin presented at 10.2, down to 9, however I do not think this represents what her actual blood count is given her hemodynamic response earlier Already received 2 units of crystalloid and route to the ICU DOAC's been on hold since yesterday evening Plan IV Cordis has been placed in the left femoral vein, currently preparing to get her second unit of blood PPI twice daily We will go ahead and continue previously ordered Kcentra , not sure how much effect this will have given timeframe of last Eliquis  administration Will give 2 g of calcium  gluconate Also administer 1000 mg of TXA intravenously Interventional radiology has already assessed the patient with plan to bring her to IR for possible embolization Hold further anticoagulation for now Hold antihypertensives Serial CBC and coags Follow-up pending lactate  Acute blood loss anemia secondary to upper GI bleed Plan See above  Transient chest pain.  Resolved with oxygen and resuscitation effort Not currently candidate for any cardiac  intervention given active bleeding Troponins pending Plan Will continue to trend troponins Obtain twelve-lead EKG if develops recurrent chest pain Could consider echocardiogram, but point-of-care ultrasound certainly suggests adequate cardiac output at this point.  May be some value to identifying WMA   Stage IIIa CKD.  Baseline serum creatinine 0.9-1.2 Certainly at risk for hypoperfusion injury as well as contrast-induced ATN Plan Ensure aggressive volume resuscitation Holding her antihypertensives Ensure post resuscitation euvolemia Renal dose medications as indicated Foley catheter being placed Strict intake and output  History of DVT and pulmonary emboli on Eliquis  -June 13 lower extremity ultrasound showed DVT and there right peroneal veins as well as acute nonocclusive DVT in the left proximal femoral vein and profunda.  CT chest at that time showed bilateral acute pulmonary emboli without evidence of heart strain Plan Holding her anticoagulation in the setting of life-threatening bleeding We will repeat lower extremity ultrasounds Will need to decide on safety of resuming anticoagulation versus IVC filter  Fluid and electrolyte imbalance: Hypokalemia Plan Replace and recheck  History of ulcerative colitis, with remote ileostomy, and complicated surgical history with prior fistulas and high output ostomy Plan Being followed by GI Focus currently will be Active GI bleed Additional recommendations per GI  History of thyroid  mass Plan Follow-up outpatient   Best Practice (right click and Reselect all SmartList Selections daily)   Diet/type: NPO DVT prophylaxis SCD Pressure ulcer(s): N/A GI prophylaxis: PPI Lines: Central line Foley:  Yes, and it is still needed Code Status:  full code Last date  of multidisciplinary goals of care discussion [pending]  Labs   CBC: Recent Labs  Lab 06/20/24 0945 06/23/24 1107 06/23/24 1536  WBC 6.8 8.3  --   NEUTROABS 4.4   --   --   HGB 11.4* 10.2* 9.0*  HCT 34.5* 31.4* 27.6*  MCV 83.7 84.2  --   PLT 346 366  --     Basic Metabolic Panel: Recent Labs  Lab 06/20/24 0945 06/23/24 1107  NA 138 138  K 3.8 3.2*  CL 103 102  CO2 28 26  GLUCOSE 90 104*  BUN 19 26*  CREATININE 1.20* 1.06*  CALCIUM  9.2 9.4   GFR: Estimated Creatinine Clearance: 49.1 mL/min (A) (by C-G formula based on SCr of 1.06 mg/dL (H)). Recent Labs  Lab 06/20/24 0945 06/23/24 1107  WBC 6.8 8.3    Liver Function Tests: Recent Labs  Lab 06/20/24 0945 06/23/24 1107  AST 15 20  ALT 9 11  ALKPHOS 86 79  BILITOT 0.4 0.6  PROT 7.4 7.2  ALBUMIN  3.9 3.2*   No results for input(s): LIPASE, AMYLASE in the last 168 hours. No results for input(s): AMMONIA in the last 168 hours.  ABG    Component Value Date/Time   TCO2 27 11/01/2015 1949     Coagulation Profile: No results for input(s): INR, PROTIME in the last 168 hours.  Cardiac Enzymes: No results for input(s): CKTOTAL, CKMB, CKMBINDEX, TROPONINI in the last 168 hours.  HbA1C: Hgb A1c MFr Bld  Date/Time Value Ref Range Status  04/21/2024 04:58 AM 5.6 4.8 - 5.6 % Final    Comment:    (NOTE)         Prediabetes: 5.7 - 6.4         Diabetes: >6.4         Glycemic control for adults with diabetes: <7.0   03/06/2024 10:20 AM 5.9 4.6 - 6.5 % Final    Comment:    Glycemic Control Guidelines for People with Diabetes:Non Diabetic:  <6%Goal of Therapy: <7%Additional Action Suggested:  >8%     CBG: No results for input(s): GLUCAP in the last 168 hours.  Review of Systems:   No abd pain No SOB CP resolved.   Past Medical History:  She,  has a past medical history of Anemia, Anxiety, Arthritis, Blood transfusion, Bowel obstruction (HCC), Colitis, ulcerative (HCC), Colon polyps, DVT (deep venous thrombosis) (HCC), Hyperlipidemia, Hypertension, Ileostomy present (HCC), Kidney disease, Numbness and tingling, Pulmonary embolism (HCC), Shortness of  breath dyspnea, and Type 2 diabetes mellitus with complication, without long-term current use of insulin  (HCC) (09/05/2015).   Surgical History:   Past Surgical History:  Procedure Laterality Date   ABDOMINAL HYSTERECTOMY  2000   ABDOMINAL SURGERY     BREAST CYST ASPIRATION     CATARACT EXTRACTION W/PHACO Right 10/20/2017   Procedure: CATARACT EXTRACTION PHACO AND INTRAOCULAR LENS PLACEMENT (IOC);  Surgeon: Myrna Adine Anes, MD;  Location: [facility];  Service: Ophthalmology;  Laterality: Right;  US  00:50.1 AP% 13.2 CDE 6.61 Fluid Pack lot # [id number]   COLON SURGERY     colostomy   cortisol shot     in left shoulder   DILATION AND CURETTAGE OF UTERUS     most likely after miscarriage   ESOPHAGOGASTRODUODENOSCOPY N/A 05/03/2024   Procedure: EGD (ESOPHAGOGASTRODUODENOSCOPY);  Surgeon: Charlanne Groom, MD;  Location: [facility];  Service: Gastroenterology;  Laterality: N/A;   fibrocystic breast disease     q 6 month mammogram   gravida 6 para 2  all SAB   hyadiform mole     ILEO LOOP DIVERSION N/A 09/25/2015   Procedure: ILEO LOOP COLOSTOMY;  Surgeon: Bernarda Ned, MD;  Location: [facility];  Service: General;  Laterality: N/A;   ILEOSTOMY CLOSURE N/A 10/08/2015   Procedure: ILEOSTOMY REVISION;  Surgeon: Bernarda Ned, MD;  Location: [facility];  Service: General;  Laterality: N/A;   ILEOSTOMY CLOSURE N/A 08/20/2016   Procedure: OPEN RELOCATION OF ILEOSTOMY;  Surgeon: Bernarda Ned, MD;  Location: [facility];  Service: General;  Laterality: N/A;   IR GENERIC HISTORICAL  09/01/2016   IR US  GUIDE VASC ACCESS RIGHT 09/01/2016 Franky Rusk, PA-C [facility]   IR GENERIC HISTORICAL  09/01/2016   IR FLUORO GUIDE CV LINE RIGHT 09/01/2016 Franky Rusk, PA-C [facility]   KNEE ARTHROSCOPY W/ MENISCAL REPAIR     right knee ([place])   LAPAROSCOPIC SMALL BOWEL RESECTION N/A 09/25/2015   Procedure: LAPAROSCOPIC BOWEL RESECTION TIMES TWO;  Surgeon: Bernarda Ned, MD;  Location: [facility];  Service:  General;  Laterality: N/A;   ROBOTIC ASSISTED LAPAROSCOPIC LYSIS OF ADHESION N/A 09/25/2015   Procedure: XI ROBOTIC ASSISTED LAPAROSCOPIC LYSIS OF ADHESION;  Surgeon: Bernarda Ned, MD;  Location: [facility];  Service: General;  Laterality: N/A;  90 minutes   Small bowel obstruction       Social History:   reports that she has never smoked. She has never been exposed to tobacco smoke. She has never used smokeless tobacco. She reports current alcohol use of about 2.0 standard drinks of alcohol per week. She reports that she does not use drugs.   Family History:  Her family history includes Breast cancer in her maternal aunt; Breast cancer (age of onset: 7) in her sister; Breast cancer (age of onset: 48) in her mother; Colon cancer in her paternal aunt; Diabetes in her brother, father, maternal grandmother, mother, sister, and sister; Diverticulosis in her brother and sister; HIV in her father; Heart disease in her father; Hyperlipidemia in her daughter; Irritable bowel syndrome in her sister; Throat cancer in her sister.   Allergies Allergies  Allergen Reactions   Betadine  [Povidone Iodine ] Rash and Other (See Comments)    The skin is very sensitive   Latex Rash and Other (See Comments)    Redness of skin   Tape Rash and Other (See Comments)    Redness of skin OK with hypofix tape and paper tape! NO transpore tape adhesive tape!     Home Medications  Prior to Admission medications   Medication Sig Start Date End Date Taking? Authorizing Provider  ALPRAZolam  (XANAX ) 0.25 MG tablet Take 1 tablet (0.25 mg total) by mouth 2 (two) times daily as needed for anxiety. 03/06/24  Yes Joshua Ned CROME, MD  apixaban  (ELIQUIS ) 5 MG TABS tablet Take 1 tablet (5 mg total) by mouth 2 (two) times daily. 05/02/24  Yes Thayil, Irene T, PA-C  augmented betamethasone dipropionate (DIPROLENE-AF) 0.05 % cream Apply 1 Application topically 2 (two) times daily as needed (Dermatitis). 06/01/24  Yes [provider]  cholecalciferol (VITAMIN D3) 25 MCG (1000 UNIT) tablet Take 1,000 Units by mouth daily.   Yes [provider]  hydrocortisone  2.5 % cream APPLY TOPICALLY TWICE DAILY Patient taking differently: Apply 1 Application topically 2 (two) times daily as needed (Dermatitis). 05/17/24  Yes Joshua Ned CROME, MD  indapamide  (LOZOL ) 1.25 MG tablet TAKE 1 TABLET BY MOUTH DAILY 05/17/24  Yes Joshua Ned CROME, MD  losartan  (COZAAR ) 50 MG tablet Take 1 tablet (  50 mg total) by mouth daily. 05/22/24  Yes Joshua Debby CROME, MD  Multiple Vitamin (MULTIVITAMIN WITH MINERALS) TABS tablet Take 1 tablet by mouth daily with breakfast.   Yes [provider]  nystatin  cream (MYCOSTATIN ) Apply 1 Application topically 2 (two) times daily. Patient taking differently: Apply 1 Application topically 2 (two) times daily as needed for dry skin. 04/24/24  Yes [place], PA-C  pantoprazole  (PROTONIX ) 40 MG tablet Take 1 tablet (40 mg total) by mouth daily. 05/16/24  Yes [place], PA-C  potassium chloride  (KLOR-CON ) 10 MEQ tablet TAKE 1 TABLET BY MOUTH 3 TIMES  DAILY 05/17/24  Yes Joshua Debby CROME, MD     Critical care time: 45 min

## 2024-06-23 NOTE — Significant Event (Signed)
 Rapid Response Event Note   Reason for Call :  Hypotension  Initial Focused Assessment:  Patient lethargic placed in trendelenburg with a BP of 44/24 HR NSR in the 60's. Patient presented to ED due to GI bleed. MD Rizwan at bedside, 2L Nacl pressure bagged into patient. Patient BP and mentation improved with Nacl Bolus. 2 unit PRBC ordered. First unit hung and patient transferred to ICU. CCM consulted, Jeralyn Banner NP and Dorn Chill MD at bedside. IR consulted and also at bedside. Central line placed. Patient then went to IR for gastrointestinal bleed.   Omega LILLETTE Settles, RN

## 2024-06-23 NOTE — ED Notes (Signed)
 Pt up to wheelchair to transport upstairs- had a syncopal episode while seated. Pt placed back in bed and hospitalist has been paged.

## 2024-06-23 NOTE — ED Triage Notes (Signed)
 Pt presents to ED from home C/O blood in colostomy bag s/p endoscopy yesterday. Pt reports 9 episodes of blood emptied from colostomy, and is feeling dizzy. Takes Eliquis , but held this AM dose.

## 2024-06-23 NOTE — Progress Notes (Addendum)
 Pt arrived to 1421 from ED symptomatic. She was mentating and hemodynamically stable but was complaining of nausea and dizziness in a supine position. 150cc of bright red blood emptied from ostomy. BP was 92/62(62) on arrival. Dr. Earley and GI rounding team paged regarding concern for pt's status with continued complaints of dizziness and nausea. Before MD was able to arrive pt decompensated within minutes with BP dropping to 40/20s with decreased mentation. Pt placed in trendelenburg, placed on oxygen, rapid response team called, fluids started, manual pressure was in the 80/40s, zofran  given. Both MDs and Rapid Response team arrived quickly. Bolus given and additional IV access obtained, orders received for a unit of blood which was hung at 1558. BP improved to 90/60s, Pt stabilized and transferred to ICU/SD room 1237 at approx 1615. RN [place] Updated on pt status.

## 2024-06-23 NOTE — Procedures (Signed)
 Central Venous Catheter Insertion Procedure Note  VERGIA CHEA  [id number]  11/24/1955  Date:06/23/24  Time:5:31 PM   Provider Performing:Pete FORBES Jenna   Procedure: Insertion of Non-tunneled Central Venous Catheter(36556) with US  guidance (23062)   Indication(s) Difficult access  Consent Unable to obtain consent due to emergent nature of procedure.  Anesthesia Topical only with 1% lidocaine    Timeout Verified patient identification, verified procedure, site/side was marked, verified correct patient position, special equipment/implants available, medications/allergies/relevant history reviewed, required imaging and test results available.  Sterile Technique Maximal sterile technique including full sterile barrier drape, hand hygiene, sterile gown, sterile gloves, mask, hair covering, sterile ultrasound probe cover (if used).  Procedure Description Area of catheter insertion was cleaned with chlorhexidine  and draped in sterile fashion.  With real-time ultrasound guidance a introducer sheath was placed into the left femoral vein. Nonpulsatile blood flow and easy flushing noted in all ports.  The catheter was sutured in place and sterile dressing applied.  Complications/Tolerance None; patient tolerated the procedure well. Chest X-ray is ordered to verify placement for internal jugular or subclavian cannulation.   Chest x-ray is not ordered for femoral cannulation.  EBL Minimal  Specimen(s) None

## 2024-06-23 NOTE — Telephone Encounter (Signed)
 No answer after follow up call. Voice message left.

## 2024-06-23 NOTE — Procedures (Signed)
 Vascular and Interventional Radiology Procedure Note  Patient: Latasha Wood DOB: 04/04/1956 Medical Record Number: [id number] Note Date/Time: 06/23/24 7:07 PM   Performing Physician: Thom Hall, MD Assistant(s): None  Diagnosis: LGIB.  Procedure: MESENTERIC ARTERIOGRAPHY, including SMA and BRANCH ANGIOGRAPHY  Anesthesia: Conscious Sedation Complications: None Estimated Blood Loss: 0 mL Specimens: None  Findings:  - access via the RIGHT femoral artery. - SMA and distal branch subselective arteriography without evidence of active extravasation. - No embolization was performed. - AngioSeal closure at the R groin w RLE pulses at the end of the case.  Final report to follow once all images are reviewed and compared with previous studies.  See detailed dictation with images in PACS. The patient tolerated the procedure well without incident or complication and was returned to ICU in stable condition.    Thom Hall, MD [facility]   Pager. [phone] Clinic. [phone]

## 2024-06-23 NOTE — Telephone Encounter (Signed)
 Patient called answering servicve  Bloody ileostomy output since 2100 last night  NL ileoscopy yesterday, no biopsies  Has remained on Eliquis  but hasn't taken today  Slightly dizzy once or twice  Pictures sent reviewed  Advised to go to [facility] and be assessed and hold Eliquis 

## 2024-06-23 NOTE — Telephone Encounter (Signed)
 I called the patient back to check on her, did not see any labs in our system.  She states the bleeding persisted so she went to the ED.  She looked at her ostomy site and states she cannot see any clear cause for the bleeding there.  She thinks it is internal.  Pretty surprised by this if it is related to her procedure.  Her ileoscopy was unremarkable yesterday, no biopsies taken or interventions done.  It is possible mucosal injury from just placing the camera yesterday could be causing this although just very unusual for a diagnostic exam, if this was the case I would have expected more immediate bleeding after the procedure.  It did not start for many hours post procedure.  I did not appreciate any abnormality during the exam that would have caused this.  Alternatively she could be having rebleeding from the initial source of her bleeding in June which was never identified.  Had held off on capsule endoscopy up to this point given history of obstruction and her anatomy.  Difficult situation, I will discuss her case with my colleagues who will be able involved in her hospital stay.  Looks like she has labs pending and CTA ordered.  Hopefully CTA can identify cause of this if she is having active bleeding.

## 2024-06-23 NOTE — Sedation Documentation (Signed)
 RN Bertie Simien pulled 200 mcg Fentanyl  in IR room. Pt. Received  100 mcg Fentanyl  throughout the procedure. RN Akane Tessier returned 100mcg Fentanyl  to IR room pysix.

## 2024-06-23 NOTE — H&P (Addendum)
 History and Physical    Latasha Wood  FMW:[id number]  DOB: 1956-01-29  DOA: 06/23/2024 PCP: Joshua Debby CROME, MD   Patient coming from: Home  Chief Complaint: Bleeding in ileostomy  HPI: Latasha Wood is a 68 y.o. female with medical history of ulcerative colitis, total colectomy end ileostomy, high output ostomy, DVT/PE who presents to the hospital for blood in her ostomy.  The patient underwent an EGD on 6/25 with Dr. Charlanne for melena which was found to be unremarkable.  CTA was unremarkable for overt bleeding.  Bleeding resolved spontaneously and Eliquis  was continued.  She was evaluated again by GI for blood in the ostomy on 06/08/2024 and underwent an ileoscopy on 06/22/2024 with Dr. Leigh.  No signs of bleeding noted and Eliquis  continued.  Subsequent to the ileostomy, she had increased bleeding with dark blood mixed with stools and had to empty her ileostomy bag at least 8 times about an hour prior to coming into the ED.       Review of Systems:  All other systems reviewed and apart from HPI, are negative.  Past Medical History:  Diagnosis Date   Anemia    Anxiety    Arthritis    Blood transfusion    Bowel obstruction (HCC)    Colitis, ulcerative (HCC)    Colon polyps    DVT (deep venous thrombosis) (HCC)    Hyperlipidemia    Hypertension    Ileostomy present (HCC)    Kidney disease    Numbness and tingling    hands and feet bilat    Pulmonary embolism (HCC)    Shortness of breath dyspnea    talking or walking    Type 2 diabetes mellitus with complication, without long-term current use of insulin  (HCC) 09/05/2015   currently on no medications, (05/26/2016) pt denies diabetes.  States that she had one time high blood sugars d/t prednisone     Past Surgical History:  Procedure Laterality Date   ABDOMINAL HYSTERECTOMY  2000   ABDOMINAL SURGERY     BREAST CYST ASPIRATION     CATARACT EXTRACTION W/PHACO Right 10/20/2017   Procedure: CATARACT EXTRACTION  PHACO AND INTRAOCULAR LENS PLACEMENT (IOC);  Surgeon: Myrna Adine Anes, MD;  Location: [facility];  Service: Ophthalmology;  Laterality: Right;  US  00:50.1 AP% 13.2 CDE 6.61 Fluid Pack lot # [id number]   COLON SURGERY     colostomy   cortisol shot     in left shoulder   DILATION AND CURETTAGE OF UTERUS     most likely after miscarriage   ESOPHAGOGASTRODUODENOSCOPY N/A 05/03/2024   Procedure: EGD (ESOPHAGOGASTRODUODENOSCOPY);  Surgeon: Charlanne Groom, MD;  Location: [facility];  Service: Gastroenterology;  Laterality: N/A;   fibrocystic breast disease     q 6 month mammogram   gravida 6 para 2     all SAB   hyadiform mole     ILEO LOOP DIVERSION N/A 09/25/2015   Procedure: ILEO LOOP COLOSTOMY;  Surgeon: Bernarda Debby, MD;  Location: [facility];  Service: General;  Laterality: N/A;   ILEOSTOMY CLOSURE N/A 10/08/2015   Procedure: ILEOSTOMY REVISION;  Surgeon: Bernarda Debby, MD;  Location: [facility];  Service: General;  Laterality: N/A;   ILEOSTOMY CLOSURE N/A 08/20/2016   Procedure: OPEN RELOCATION OF ILEOSTOMY;  Surgeon: Bernarda Debby, MD;  Location: [facility];  Service: General;  Laterality: N/A;   IR GENERIC HISTORICAL  09/01/2016   IR US  GUIDE VASC ACCESS RIGHT 09/01/2016 Franky Rusk, PA-C [facility]   IR  GENERIC HISTORICAL  09/01/2016   IR FLUORO GUIDE CV LINE RIGHT 09/01/2016 Franky Rusk, PA-C [facility]   KNEE ARTHROSCOPY W/ MENISCAL REPAIR     right knee ([place])   LAPAROSCOPIC SMALL BOWEL RESECTION N/A 09/25/2015   Procedure: LAPAROSCOPIC BOWEL RESECTION TIMES TWO;  Surgeon: Bernarda Ned, MD;  Location: [facility];  Service: General;  Laterality: N/A;   ROBOTIC ASSISTED LAPAROSCOPIC LYSIS OF ADHESION N/A 09/25/2015   Procedure: XI ROBOTIC ASSISTED LAPAROSCOPIC LYSIS OF ADHESION;  Surgeon: Bernarda Ned, MD;  Location: [facility];  Service: General;  Laterality: N/A;  90 minutes   Small bowel obstruction      Social History:   reports that she has never smoked. She has never been  exposed to tobacco smoke. She has never used smokeless tobacco. She reports current alcohol use of about 2.0 standard drinks of alcohol per week. She reports that she does not use drugs.  Allergies  Allergen Reactions   Betadine  [Povidone Iodine ] Rash and Other (See Comments)    The skin is very sensitive   Latex Rash and Other (See Comments)    Redness of skin   Tape Rash and Other (See Comments)    Redness of skin OK with hypofix tape and paper tape! NO transpore tape adhesive tape!    Family History  Problem Relation Age of Onset   Diabetes Mother    Breast cancer Mother 85   Diabetes Father    Heart disease Father    HIV Father    Diabetes Sister    Breast cancer Sister 26   Diverticulosis Sister    Throat cancer Sister    Diabetes Sister    Irritable bowel syndrome Sister    Diabetes Brother    Diverticulosis Brother    Diabetes Maternal Grandmother    Hyperlipidemia Daughter    Breast cancer Maternal Aunt    Colon cancer Paternal Aunt      Prior to Admission medications   Medication Sig Start Date End Date Taking? Authorizing Provider  ALPRAZolam  (XANAX ) 0.25 MG tablet Take 1 tablet (0.25 mg total) by mouth 2 (two) times daily as needed for anxiety. 03/06/24   Joshua Ned CROME, MD  apixaban  (ELIQUIS ) 5 MG TABS tablet Take 1 tablet (5 mg total) by mouth 2 (two) times daily. 05/02/24   Thayil, Irene T, PA-C  augmented betamethasone dipropionate (DIPROLENE-AF) 0.05 % cream SMARTSIG:sparingly Topical Twice Daily PRN 06/01/24   [provider]  cholecalciferol (VITAMIN D3) 25 MCG (1000 UNIT) tablet Take 1,000 Units by mouth daily.    [provider]  hydrocortisone  2.5 % cream APPLY TOPICALLY TWICE DAILY 05/17/24   Joshua Ned CROME, MD  indapamide  (LOZOL ) 1.25 MG tablet TAKE 1 TABLET BY MOUTH DAILY 05/17/24   Joshua Ned CROME, MD  losartan  (COZAAR ) 50 MG tablet Take 1 tablet (50 mg total) by mouth daily. 05/22/24   Joshua Ned CROME, MD  Multiple Vitamin  (MULTIVITAMIN WITH MINERALS) TABS tablet Take 1 tablet by mouth daily with breakfast.    [provider]  nystatin  cream (MYCOSTATIN ) Apply 1 Application topically 2 (two) times daily. 04/24/24   [place], PA-C  pantoprazole  (PROTONIX ) 40 MG tablet Take 1 tablet (40 mg total) by mouth daily. 05/16/24   [place], PA-C  potassium chloride  (KLOR-CON ) 10 MEQ tablet TAKE 1 TABLET BY MOUTH 3 TIMES  DAILY 05/17/24   Joshua Ned CROME, MD    Physical Exam: Wt Readings from Last 3 Encounters:  06/22/24 76.7 kg  06/08/24 76.7 kg  05/23/24 75.2 kg   Vitals:   06/23/24 1028 06/23/24 1130 06/23/24 1200 06/23/24 1245  BP: (!) 131/90 105/75  107/68  Pulse: 97 83 72 74  Resp: 18 16 12 17   Temp: 99 F (37.2 C)     TempSrc: Oral     SpO2: 98% 99% 99% 99%      Constitutional:  Calm & comfortable Eyes: PERRLA, lids and conjunctivae normal ENT:  Mucous membranes are moist.  Pharynx clear of exudate   Normal dentition.  Respiratory:  Clear to auscultation bilaterally  Normal respiratory effort.  Cardiovascular:  S1 & S2 heard, regular rate and rhythm No Murmurs Abdomen:  Non distended No tenderness, No masses Bowel sounds normal Extremities:  No clubbing / cyanosis No pedal edema  Skin:  No rashes, lesions or ulcers Neurologic:  AAO x 3 CN 2-12 grossly intact Sensation intact Strength 5/5 in all 4 extremities Psychiatric:  Normal Mood and affect    Labs on Admission: I have personally reviewed labs and imaging studies   EKG: Independently reviewed.  Normal sinus rhythm  Assessment/Plan Principal Problem:   GI bleed with suspected hemorrhagic shock -history of ulcerative colitis status post total colectomy end ileostomy - CTA: Probable accumulation of high density material seen in dilated segment of small bowel in pelvis near surgical anastomosis- felt to be a slow bleed - She was evaluated in the ED and referred for admission.  She had a syncopal episode while  in the ED which was associated with nausea.  After the episode, the patient was awake alert oriented and vitals were stable.  It was felt that she had a vasovagal episode and she was transferred upstairs.  When she arrived to 4 E., she had another near syncopal episode was noted to be cold and clammy.  Blood pressure was noted to be 80s over 50 systolic.  Her RN stated that she had emptied about 150 cc of blood from the ileostomy.  On my eval, she was lethargic and moaning.  She was placed in Trendelenburg, and given IV fluid boluses.  She quickly became much more alert and oriented and was able to give a detailed history.  Blood transfusion (2 units PRBC) ordered emergently and transfusion initiated.  Kcentra  ordered and patient transferred to the ICU.  Dr. Stacia came to the bedside to evaluate the patient and advised that we request IR to assess her for embolization.  Plan discussed with IR, Dr Luverne states that his team will evaluate her for embolization. - Patient transferred to [facility] discussed with PCCM, Jeralyn Banner who will take over care.   Active Problems:  Hypokalemia - Possibly secondary to chlorthalidone - Replace and follow - Check magnesium   Anemia secondary to acute blood loss - Hemoglobin 11.4 on 8/12 - Hemoglobin noted to be 9.0 when she became hypotensive-suspect that hemoglobin has not equilibrated yet and actual hemoglobin is likely lower    Stage 2- 3a chronic kidney disease (HCC) -Baseline creatinine ranges from 0.93-1.20  History of PE/DVT - Eliquis  reversed with Kcentra   History of hypertension - Hold indapamide  and losartan     History of thyroid  nodule - Outpatient follow-up recommended    DVT prophylaxis: SCDs  Code Status: Full code  Consults called: GI, IR  Admission status: Inpatient Level of care: ICU Total critical care time: 90 min  True Atlas MD [facility]    06/23/2024, 1:17 PM

## 2024-06-24 ENCOUNTER — Inpatient Hospital Stay (HOSPITAL_COMMUNITY): Admitting: Certified Registered Nurse Anesthetist

## 2024-06-24 ENCOUNTER — Encounter (HOSPITAL_COMMUNITY): Payer: Self-pay | Admitting: Internal Medicine

## 2024-06-24 ENCOUNTER — Encounter (HOSPITAL_COMMUNITY)
Admission: EM | Disposition: A | Discharge status: Home / Self Care | Payer: Self-pay | Source: Home / Self Care | Attending: Internal Medicine

## 2024-06-24 DIAGNOSIS — Z9049 Acquired absence of other specified parts of digestive tract: Secondary | ICD-10-CM | POA: Diagnosis not present

## 2024-06-24 DIAGNOSIS — D62 Acute posthemorrhagic anemia: Secondary | ICD-10-CM | POA: Diagnosis not present

## 2024-06-24 DIAGNOSIS — D509 Iron deficiency anemia, unspecified: Secondary | ICD-10-CM | POA: Diagnosis not present

## 2024-06-24 DIAGNOSIS — K9411 Enterostomy hemorrhage: Secondary | ICD-10-CM | POA: Diagnosis not present

## 2024-06-24 DIAGNOSIS — R578 Other shock: Secondary | ICD-10-CM | POA: Diagnosis not present

## 2024-06-24 DIAGNOSIS — K9189 Other postprocedural complications and disorders of digestive system: Secondary | ICD-10-CM | POA: Diagnosis not present

## 2024-06-24 DIAGNOSIS — K922 Gastrointestinal hemorrhage, unspecified: Secondary | ICD-10-CM | POA: Diagnosis not present

## 2024-06-24 LAB — COMPREHENSIVE METABOLIC PANEL WITH GFR
ALT: 70 U/L — ABNORMAL HIGH (ref 0–44)
AST: 75 U/L — ABNORMAL HIGH (ref 15–41)
Albumin: 1.7 g/dL — ABNORMAL LOW (ref 3.5–5.0)
Alkaline Phosphatase: 124 U/L (ref 38–126)
Anion gap: 11 (ref 5–15)
BUN: 25 mg/dL — ABNORMAL HIGH (ref 8–23)
CO2: 27 mmol/L (ref 22–32)
Calcium: 8.1 mg/dL — ABNORMAL LOW (ref 8.9–10.3)
Chloride: 107 mmol/L (ref 98–111)
Creatinine, Ser: 0.39 mg/dL — ABNORMAL LOW (ref 0.44–1.00)
GFR, Estimated: >60 mL/min (ref >60)
Glucose, Bld: 112 mg/dL — ABNORMAL HIGH (ref 70–99)
Potassium: 4 mmol/L (ref 3.5–5.1)
Sodium: 145 mmol/L (ref 135–145)
Total Bilirubin: 0.4 mg/dL (ref 0.0–1.2)
Total Protein: 5.1 g/dL — ABNORMAL LOW (ref 6.5–8.1)

## 2024-06-24 LAB — CBC WITH DIFFERENTIAL/PLATELET
Abs Immature Granulocytes: 0.03 K/uL (ref 0.00–0.07)
Basophils Absolute: 0 K/uL (ref 0.0–0.1)
Basophils Relative: 0 %
Eosinophils Absolute: 0.3 K/uL (ref 0.0–0.5)
Eosinophils Relative: 3 %
HCT: 29.5 % — ABNORMAL LOW (ref 36.0–46.0)
Hemoglobin: 9.7 g/dL — ABNORMAL LOW (ref 12.0–15.0)
Immature Granulocytes: 0 %
Lymphocytes Relative: 22 %
Lymphs Abs: 1.9 K/uL (ref 0.7–4.0)
MCH: 27.4 pg (ref 26.0–34.0)
MCHC: 32.9 g/dL (ref 30.0–36.0)
MCV: 83.3 fL (ref 80.0–100.0)
Monocytes Absolute: 0.6 K/uL (ref 0.1–1.0)
Monocytes Relative: 7 %
Neutro Abs: 5.9 K/uL (ref 1.7–7.7)
Neutrophils Relative %: 68 %
Platelets: 229 K/uL (ref 150–400)
RBC: 3.54 MIL/uL — ABNORMAL LOW (ref 3.87–5.11)
RDW: 15.3 % (ref 11.5–15.5)
WBC: 8.8 K/uL (ref 4.0–10.5)
nRBC: 0 % (ref 0.0–0.2)

## 2024-06-24 LAB — MAGNESIUM: Magnesium: 1.9 mg/dL (ref 1.7–2.4)

## 2024-06-24 LAB — HEMOGLOBIN AND HEMATOCRIT, BLOOD
HCT: 30.9 % — ABNORMAL LOW (ref 36.0–46.0)
HCT: 35.5 % — ABNORMAL LOW (ref 36.0–46.0)
Hemoglobin: 10.1 g/dL — ABNORMAL LOW (ref 12.0–15.0)
Hemoglobin: 10.2 g/dL — ABNORMAL LOW (ref 12.0–15.0)

## 2024-06-24 LAB — TROPONIN I (HIGH SENSITIVITY): Troponin I (High Sensitivity): 4 ng/L (ref <18)

## 2024-06-24 SURGERY — CANCELLED PROCEDURE
Anesthesia: Monitor Anesthesia Care

## 2024-06-24 MED ORDER — POLYETHYLENE GLYCOL 3350 17 GM/SCOOP PO POWD
119.0000 g | Freq: Once | ORAL | Status: AC
  Administered 2024-06-24: 119 g via ORAL
  Filled 2024-06-24: qty 119

## 2024-06-24 NOTE — Progress Notes (Signed)
 Latasha Wood was scheduled for Ileoscopy through the stoma with Dr. Stacia on 06/24/24, at [facility] as an inpatient.   On pre-operative assessment, pt states that she was not given miralax  bowel preparation overnight due to being NPO. Pt states she has had thick and dark output from her ostomy this AM. This RN assessed the ostomy output and noted it to be semi-solid and maroon in color. MD Stacia at bedside and discussed w/ patient. Ileoscopy cancelled for today. Pt demonstrated understanding.  Ozell VEAR Pouch, RN 06/24/24 7:41 AM

## 2024-06-24 NOTE — Progress Notes (Signed)
 [place] Latasha Wood   Subjective: Patient underwent mesenteric arteriography yesterday evening after transferring to ICU in the setting of hemorrhagic shock.  No extravasation was noted on angiography, therefore no embolization performed.  The patient's hemoglobin has been stable overnight and her hemodynamics much improved.  No pressors required.  She has continued to put out thick dark red stool per ostomy. Nursing report indicates that she was given the MiraLAX  prep this morning at 420, but patient adamantly denies she was given any prep.  Stool in the ostomy is thick and dark red.   Objective: Vital signs in last 24 hours: Temp:  [97.5 F (36.4 C)-99 F (37.2 C)] 97.9 F (36.6 C) (08/16 0700) Pulse Rate:  [62-97] 65 (08/16 0728) Resp:  [8-22] 16 (08/16 0728) BP: (77-131)/(36-90) 118/76 (08/16 0728) SpO2:  [84 %-100 %] 99 % (08/16 0728)   General: NAD, pleasant African-American female Lungs:  CTA b/l, no w/r/r Heart:  RRR, no m/r/g Abdomen:  Soft, NT, ND, +BS, ostomy bag with thick dark red stool Ext:  No c/c/e    Intake/Output from previous day: 08/15 0701 - 08/16 0700 In: 1288.5 [I.V.:157.2; Blood:733; IV Piggyback:398.3] Out: 1500 [Urine:1150; Stool:350] Intake/Output this shift: No intake/output data recorded.   Lab Results: Recent Labs    06/23/24 1107 06/23/24 1536 06/23/24 2025 06/24/24 0243 06/24/24 0618  WBC 8.3  --   --   --   --   HGB 10.2*   < > 10.9* 10.1* 10.2*  PLT 366  --   --   --   --   MCV 84.2  --   --   --   --    < > = values in this interval not displayed.   BMET Recent Labs    06/23/24 1107  NA 138  K 3.2*  CL 102  CO2 26  GLUCOSE 104*  BUN 26*  CREATININE 1.06*  CALCIUM  9.4   LFT Recent Labs    06/23/24 1107  PROT 7.2  ALBUMIN  3.2*  AST 20  ALT 11  ALKPHOS 79  BILITOT 0.6   PT/INR No results for input(s): INR in the last 72 hours.    Imaging/Other results: CT Angio Abd/Pel W and/or Wo  Contrast Result Date: 06/23/2024 CLINICAL DATA:  Lower gastrointestinal bleeding. EXAM: CTA ABDOMEN AND PELVIS WITHOUT AND WITH CONTRAST TECHNIQUE: Multidetector CT imaging of the abdomen and pelvis was performed using the standard protocol during bolus administration of intravenous contrast. Multiplanar reconstructed images and MIPs were obtained and reviewed to evaluate the vascular anatomy. RADIATION DOSE REDUCTION: This exam was performed according to the departmental dose-optimization program which includes automated exposure control, adjustment of the mA and/or kV according to patient size and/or use of iterative reconstruction technique. CONTRAST:  80mL OMNIPAQUE  IOHEXOL  350 MG/ML SOLN COMPARISON:  January 15, 2020. FINDINGS: VASCULAR Aorta: Atherosclerosis of abdominal aorta without aneurysm or dissection. Celiac: Patent without evidence of aneurysm, dissection, vasculitis or significant stenosis. SMA: Patent without evidence of aneurysm, dissection, vasculitis or significant stenosis. Renals: Both renal arteries are patent without evidence of aneurysm, dissection, vasculitis, fibromuscular dysplasia or significant stenosis. IMA: Not definitively visualized. Inflow: Patent without evidence of aneurysm, dissection, vasculitis or significant stenosis. Proximal Outflow: Bilateral common femoral and visualized portions of the superficial and profunda femoral arteries are patent without evidence of aneurysm, dissection, vasculitis or significant stenosis. Veins: No obvious venous abnormality within the limitations of this arterial phase study. Review of the MIP images confirms the above findings. NON-VASCULAR Lower  chest: No acute abnormality. Hepatobiliary: No focal liver abnormality is seen. No gallstones, gallbladder wall thickening, or biliary dilatation. Pancreas: Unremarkable. No pancreatic ductal dilatation or surrounding inflammatory changes. Spleen: Normal in size without focal abnormality.  Adrenals/Urinary Tract: Adrenal glands appear normal. Right renal cyst is noted. No hydronephrosis or renal obstruction is noted. Urinary bladder is unremarkable. Stomach/Bowel: The stomach is unremarkable. Status post total colectomy. Ileostomy is noted in right lower quadrant. There is no evidence of bowel obstruction or inflammation. Dilated segment of small bowel is seen in area of surgical anastomosis. There does appear to be accumulation of high density material in this area on later images suggesting possible small focus of gastrointestinal bleeding. This is best seen on image number 58 of series 18. Lymphatic: No adenopathy is noted. Reproductive: Status post hysterectomy. No adnexal masses. Other: Small infraumbilical ventral hernia is noted which contains a loop of small bowel, but does not result in obstruction. No ascites. Musculoskeletal: No acute or significant osseous findings. IMPRESSION: VASCULAR Probable accumulation of high density material seen in dilated segment of small bowel in pelvis near surgical anastomosis. This is concerning for possible focus of gastrointestinal bleeding. Critical Value/emergent results were called by telephone at the time of interpretation on 06/23/2024 at 12:57 pm to provider [facility] , who verbally acknowledged these results. Aortic Atherosclerosis (ICD10-I70.0). 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 Small infraumbilical ventral hernia is noted. Electronically Signed   By: Lynwood Landy Raddle M.D.   On: 06/23/2024 12:57      Assessment and Plan:  68 year old female with history of ulcerative colitis status post total proctocolectomy complicated by ostomy dysfunction in 2016-2017, with recent diagnosis of bilateral pulmonary emboli/DVT in June this year, followed by hospitalization for GI bleed of unclear etiology (negative EGD), ongoing issues with iron  deficiency anemia, with normal ileoscopy as outpatient on August 14,  followed by second admission for bleeding August 15.  CTA with suspected bleeding source in the small intestine.  Patient experienced transient hemorrhagic shock evening of August 15, requiring urgent transfusion, volume resuscitation and reversal of anticoagulation with Kcentra , TXA and vitamin K.  Patient was taken to IR suite for mesenteric angiogram, which was negative for active extravasation. Patient remained hemodynamically stable overnight with stable hemoglobin. Patient was planned for repeat ileoscopy today, but apparently did not receive bowel prep.  GI bleeding, unclear etiology - Bleeding appears to have slowed/stopped for the moment - Will tentatively plan to attempt repeat ileoscopy tomorrow after bowel prep, timing to be determined - Would continue to hold Eliquis  given severe bleeding yesterday - Given her severe bleeding, and unclear source, would strongly consider IVC filter and cessation of anticoagulation - If unable to identify bleeding source endoscopically, can consider capsule endoscopy.  However, as mentioned by Dr. Leigh, there would be concern for capsule retention/bowel obstruction, so therefore would need to do patency capsule first, if able to obtain - Okay for full liquid diet today    Glendia FORBES Holt, MD  06/24/2024, 7:45 AM [place] Latasha

## 2024-06-24 NOTE — Consult Note (Signed)
 Latasha Wood, MRN:  [id number], DOB:  1956-10-06, LOS: 1 ADMISSION DATE:  06/23/2024, CONSULTATION DATE:  8/15 REFERRING MD:  Earley, CHIEF COMPLAINT:  hemorrhagic shock    History of Present Illness:  68 year old female patient with a complicated history of ulcerative pancolitis initially diagnosed at age 81 and status post initial colectomy in 2016, which has been complicated by postoperative fistulas, small bowel obstruction as well as high ostomy output which had been fully cleared over time.  Recently admitted June 12 through June 15 for pulmonary emboli and DVT discharged to home on Eliquis .  Readmitted June 24 through June 27 for dark-colored ostomy output.  Hemoglobin on presentation that time was 6.8 she had a CT angiogram at that point which was not diagnostic.  The bleeding subsided without intervention and she was eventually placed back on her DOAC and discharged home  Went to [facility] on 8/14 for ileoscopy to evaluate recurrent bleeding.  The ileostomy appeared normal on this exam there was no superficial breakdown there is no inflammation and obvious cause for bleeding  Later that evening the patient once again reported bloody output from the ileostomy.  Her nighttime Eliquis  was held and she was referred to the Emergency room.  She continued to have multiple episodes of bloody output in her ileostomy bag, reporting 9 episodes.  Was starting to feel dizzy, so presented to the emergency room.  Initially in the ER pulse 97 blood pressure 131/90.  Hemoglobin was 10 2 hematocrit 31.4 platelet count 366, potassium 3.2 BUN 26 creatinine 1.06 a CT angiogram of abdomen pelvis was obtained showed a probable accumulation of high density material seen and dilated segment of the small bowel near the surgical anastomosis site.  This was felt possibly the focal point of gastrointestinal bleeding subsequently again seen by gastroenterology in consult at the request of the ER  team.  Initial plans were to continue on IV hydration, type and cross, and monitor the patient in the intensive care.  With recommendations to reverse in a anticoagulation, and also consulted interventional radiology.  Shortly after arrival to the medical floor the patient became unresponsive with palpable blood pressure recorded in the 40s.  She was administered rapid crystalloid resuscitation, her first unit of blood was hung, and she was placed in reverse Trendelenburg.  Subsequently transferred to the floor, critical care services asked to evaluate and assume care.  Interventional radiology already aware of clinical decline with plan to bring to angiogram suite for possible embolization  Pertinent  Medical History  Ulcerative pancolitis status post an elective colectomy back in November 2016 this has been complicated by postoperative fistulas requiring revision x 2, high ostomy output, small bowel obstruction, and prior GI bleed.  Just recently diagnosed and placed on DOAC for DVT/PE. Hyperlipidemia Hypertension CKD Dyspnea Prior abdominal hysterectomy Significant Hospital Events: Including procedures, antibiotic start and stop dates in addition to other pertinent events   8/15 admitted w/bloody output from ileostomy (several episodes) initially hemodynamically stable w/ Hgb >10. Had on-going bloody output. On arrival to medical floor had acute LOC w/ SBP in 40s. 2 liters crystalloid administered. Blood started. Got K centra, IV TXA, left femoral cortis placed. IR consulted and plan to go to angiogram    Interim History / Subjective:   No acute issues overnight Patient feeling better this morning Unable to perform enteroscopy today due to lack of prep, plan to do tomorrow IR did not see bleed to embolize.  Objective  Blood pressure 116/69, pulse 62, temperature 97.9 F (36.6 C), temperature source Oral, resp. rate 13, SpO2 100%.        Intake/Output Summary (Last 24 hours) at  06/24/2024 0719 Last data filed at 06/24/2024 9341 Gross per 24 hour  Intake 1288.49 ml  Output 1500 ml  Net -211.51 ml   There were no vitals filed for this visit.  Examination: General: 68 year old woman, no distress, laying in bed HENT: /AT, moist mucous membranes Lungs: Clear to auscultation, no wheezing Cardiovascular: Regular rate and rhythm without murmur rub or gallop Abdomen: Soft, non-tender, ostomy present Extremities: warm, no edema Neuro: Oriented GU: foley in place  EKG 8/15: NSR, no ischemic changes.   Resolved problem list  Chest pain  Assessment and Plan   Hemorrhagic Shock with syncopal episode Recurrent Upper GI Bleed in setting of DOAC Hx of ulcerative colitis s/p total colectomy and end ileostomy Plan - Transfused 2 units PRBCs 8/15. Given Kcentra  and tranexamic acid . - IR consulted for arterial embolization 8/15, but not source located  - GI is following, plan for enteroscopy 8/17. Unable to perform today due to lack of prep - Trend H/H, transfuse for hemoglobin 7g/dL or less - continue PPI IV twice daily - hold anticoagulation  Acute blood loss anemia secondary to upper GI bleed Plan - See above  Stage IIIa CKD.  Baseline serum creatinine 0.9-1.2 Certainly at risk for hypoperfusion injury as well as contrast-induced ATN Plan - Holding antihypertensives - Ensure euvolemia - Renal dose medications as indicated - Strict intake and output  History of DVT and pulmonary emboli on Eliquis  -June 13 lower extremity ultrasound showed DVT and there right peroneal veins as well as acute nonocclusive DVT in the left proximal femoral vein and profunda.  CT chest at that time showed bilateral acute pulmonary emboli without evidence of heart strain Plan - Holding her anticoagulation in the setting of life-threatening bleeding - We will repeat lower extremity ultrasounds - Will need to decide on safety of resuming anticoagulation versus IVC filter  Fluid  and electrolyte imbalance: Hypokalemia Plan - Replace and recheck  History of ulcerative colitis, with remote ileostomy, and complicated surgical history with prior fistulas and high output ostomy Plan - Being followed by GI - Focus currently will be Active GI bleed - Additional recommendations per GI  History of thyroid  mass Plan - Follow-up outpatient   Best Practice (right click and Reselect all SmartList Selections daily)   Diet/type: clear liquids, NPO after midnight for scope tomorrow DVT prophylaxis SCD Pressure ulcer(s): N/A GI prophylaxis: PPI Lines: Central line Foley:  Yes, and it is still needed Code Status:  full code Last date of multidisciplinary goals of care discussion [pending]  Labs   CBC: Recent Labs  Lab 06/20/24 0945 06/23/24 1107 06/23/24 1536 06/23/24 2025 06/24/24 0243 06/24/24 0618  WBC 6.8 8.3  --   --   --   --   NEUTROABS 4.4  --   --   --   --   --   HGB 11.4* 10.2* 9.0* 10.9* 10.1* 10.2*  HCT 34.5* 31.4* 27.6* 35.3* 30.9* 35.5*  MCV 83.7 84.2  --   --   --   --   PLT 346 366  --   --   --   --     Basic Metabolic Panel: Recent Labs  Lab 06/20/24 0945 06/23/24 1107 06/24/24 0618  NA 138 138  --   K 3.8 3.2*  --  CL 103 102  --   CO2 28 26  --   GLUCOSE 90 104*  --   BUN 19 26*  --   CREATININE 1.20* 1.06*  --   CALCIUM  9.2 9.4  --   MG  --   --  1.9   GFR: Estimated Creatinine Clearance: 49.1 mL/min (A) (by C-G formula based on SCr of 1.06 mg/dL (H)). Recent Labs  Lab 06/20/24 0945 06/23/24 1107 06/23/24 2025 06/23/24 2302  WBC 6.8 8.3  --   --   LATICACIDVEN  --   --  1.3 1.0    Liver Function Tests: Recent Labs  Lab 06/20/24 0945 06/23/24 1107  AST 15 20  ALT 9 11  ALKPHOS 86 79  BILITOT 0.4 0.6  PROT 7.4 7.2  ALBUMIN  3.9 3.2*   No results for input(s): LIPASE, AMYLASE in the last 168 hours. No results for input(s): AMMONIA in the last 168 hours.  ABG    Component Value Date/Time   TCO2  27 11/01/2015 1949     Coagulation Profile: No results for input(s): INR, PROTIME in the last 168 hours.  Cardiac Enzymes: No results for input(s): CKTOTAL, CKMB, CKMBINDEX, TROPONINI in the last 168 hours.  HbA1C: Hgb A1c MFr Bld  Date/Time Value Ref Range Status  04/21/2024 04:58 AM 5.6 4.8 - 5.6 % Final    Comment:    (NOTE)         Prediabetes: 5.7 - 6.4         Diabetes: >6.4         Glycemic control for adults with diabetes: <7.0   03/06/2024 10:20 AM 5.9 4.6 - 6.5 % Final    Comment:    Glycemic Control Guidelines for People with Diabetes:Non Diabetic:  <6%Goal of Therapy: <7%Additional Action Suggested:  >8%     CBG: No results for input(s): GLUCAP in the last 168 hours.   Critical care time: n/a    Dorn Chill, MD [facility] Office: [phone]   See Amion for personal pager PCCM on call pager [phone] until 7pm. Please call Elink 7p-7a. [phone]

## 2024-06-24 NOTE — Anesthesia Preprocedure Evaluation (Deleted)
 Anesthesia Evaluation    Airway        Dental no notable dental hx.    Pulmonary neg pulmonary ROS   Pulmonary exam normal        Cardiovascular      Neuro/Psych negative neurological ROS     GI/Hepatic Neg liver ROS,,,Ulcerative colitis   Endo/Other    Renal/GU negative Renal ROS  negative genitourinary   Musculoskeletal   Abdominal   Peds  Hematology   Anesthesia Other Findings   Reproductive/Obstetrics negative OB ROS                              Anesthesia Physical Anesthesia Plan  Anesthesia Quick Evaluation

## 2024-06-24 NOTE — H&P (View-Only) (Signed)
 [facility] ROUNDING NOTE   Subjective: Patient underwent mesenteric arteriography yesterday evening after transferring to ICU in the setting of hemorrhagic shock.  No extravasation was noted on angiography, therefore no embolization performed.  The patient's hemoglobin has been stable overnight and her hemodynamics much improved.  No pressors required.  She has continued to put out thick dark red stool per ostomy. Nursing report indicates that she was given the MiraLAX  prep this morning at 420, but patient adamantly denies she was given any prep.  Stool in the ostomy is thick and dark red.   Objective: Vital signs in last 24 hours: Temp:  [97.5 F (36.4 C)-99 F (37.2 C)] 97.9 F (36.6 C) (08/16 0700) Pulse Rate:  [62-97] 65 (08/16 0728) Resp:  [8-22] 16 (08/16 0728) BP: (77-131)/(36-90) 118/76 (08/16 0728) SpO2:  [84 %-100 %] 99 % (08/16 0728)   General: NAD, pleasant African-American female Lungs:  CTA b/l, no w/r/r Heart:  RRR, no m/r/g Abdomen:  Soft, NT, ND, +BS, ostomy bag with thick dark red stool Ext:  No c/c/e    Intake/Output from previous day: 08/15 0701 - 08/16 0700 In: 1288.5 [I.V.:157.2; Blood:733; IV Piggyback:398.3] Out: 1500 [Urine:1150; Stool:350] Intake/Output this shift: No intake/output data recorded.   Lab Results: Recent Labs    06/23/24 1107 06/23/24 1536 06/23/24 2025 06/24/24 0243 06/24/24 0618  WBC 8.3  --   --   --   --   HGB 10.2*   < > 10.9* 10.1* 10.2*  PLT 366  --   --   --   --   MCV 84.2  --   --   --   --    < > = values in this interval not displayed.   BMET Recent Labs    06/23/24 1107  NA 138  K 3.2*  CL 102  CO2 26  GLUCOSE 104*  BUN 26*  CREATININE 1.06*  CALCIUM  9.4   LFT Recent Labs    06/23/24 1107  PROT 7.2  ALBUMIN  3.2*  AST 20  ALT 11  ALKPHOS 79  BILITOT 0.6   PT/INR No results for input(s): INR in the last 72 hours.    Imaging/Other results: CT Angio Abd/Pel W and/or Wo  Contrast Result Date: 06/23/2024 CLINICAL DATA:  Lower gastrointestinal bleeding. EXAM: CTA ABDOMEN AND PELVIS WITHOUT AND WITH CONTRAST TECHNIQUE: Multidetector CT imaging of the abdomen and pelvis was performed using the standard protocol during bolus administration of intravenous contrast. Multiplanar reconstructed images and MIPs were obtained and reviewed to evaluate the vascular anatomy. RADIATION DOSE REDUCTION: This exam was performed according to the departmental dose-optimization program which includes automated exposure control, adjustment of the mA and/or kV according to patient size and/or use of iterative reconstruction technique. CONTRAST:  80mL OMNIPAQUE  IOHEXOL  350 MG/ML SOLN COMPARISON:  January 15, 2020. FINDINGS: VASCULAR Aorta: Atherosclerosis of abdominal aorta without aneurysm or dissection. Celiac: Patent without evidence of aneurysm, dissection, vasculitis or significant stenosis. SMA: Patent without evidence of aneurysm, dissection, vasculitis or significant stenosis. Renals: Both renal arteries are patent without evidence of aneurysm, dissection, vasculitis, fibromuscular dysplasia or significant stenosis. IMA: Not definitively visualized. Inflow: Patent without evidence of aneurysm, dissection, vasculitis or significant stenosis. Proximal Outflow: Bilateral common femoral and visualized portions of the superficial and profunda femoral arteries are patent without evidence of aneurysm, dissection, vasculitis or significant stenosis. Veins: No obvious venous abnormality within the limitations of this arterial phase study. Review of the MIP images confirms the above findings. NON-VASCULAR Lower  chest: No acute abnormality. Hepatobiliary: No focal liver abnormality is seen. No gallstones, gallbladder wall thickening, or biliary dilatation. Pancreas: Unremarkable. No pancreatic ductal dilatation or surrounding inflammatory changes. Spleen: Normal in size without focal abnormality.  Adrenals/Urinary Tract: Adrenal glands appear normal. Right renal cyst is noted. No hydronephrosis or renal obstruction is noted. Urinary bladder is unremarkable. Stomach/Bowel: The stomach is unremarkable. Status post total colectomy. Ileostomy is noted in right lower quadrant. There is no evidence of bowel obstruction or inflammation. Dilated segment of small bowel is seen in area of surgical anastomosis. There does appear to be accumulation of high density material in this area on later images suggesting possible small focus of gastrointestinal bleeding. This is best seen on image number 58 of series 18. Lymphatic: No adenopathy is noted. Reproductive: Status post hysterectomy. No adnexal masses. Other: Small infraumbilical ventral hernia is noted which contains a loop of small bowel, but does not result in obstruction. No ascites. Musculoskeletal: No acute or significant osseous findings. IMPRESSION: VASCULAR Probable accumulation of high density material seen in dilated segment of small bowel in pelvis near surgical anastomosis. This is concerning for possible focus of gastrointestinal bleeding. Critical Value/emergent results were called by telephone at the time of interpretation on 06/23/2024 at 12:57 pm to provider [facility] , who verbally acknowledged these results. Aortic Atherosclerosis (ICD10-I70.0). 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 Small infraumbilical ventral hernia is noted. Electronically Signed   By: Lynwood Landy Raddle M.D.   On: 06/23/2024 12:57      Assessment and Plan:  68 year old female with history of ulcerative colitis status post total proctocolectomy complicated by ostomy dysfunction in 2016-2017, with recent diagnosis of bilateral pulmonary emboli/DVT in June this year, followed by hospitalization for GI bleed of unclear etiology (negative EGD), ongoing issues with iron  deficiency anemia, with normal ileoscopy as outpatient on August 14,  followed by second admission for bleeding August 15.  CTA with suspected bleeding source in the small intestine.  Patient experienced transient hemorrhagic shock evening of August 15, requiring urgent transfusion, volume resuscitation and reversal of anticoagulation with Kcentra , TXA and vitamin K.  Patient was taken to IR suite for mesenteric angiogram, which was negative for active extravasation. Patient remained hemodynamically stable overnight with stable hemoglobin. Patient was planned for repeat ileoscopy today, but apparently did not receive bowel prep.  GI bleeding, unclear etiology - Bleeding appears to have slowed/stopped for the moment - Will tentatively plan to attempt repeat ileoscopy tomorrow after bowel prep, timing to be determined - Would continue to hold Eliquis  given severe bleeding yesterday - Given her severe bleeding, and unclear source, would strongly consider IVC filter and cessation of anticoagulation - If unable to identify bleeding source endoscopically, can consider capsule endoscopy.  However, as mentioned by Dr. Leigh, there would be concern for capsule retention/bowel obstruction, so therefore would need to do patency capsule first, if able to obtain - Okay for full liquid diet today    Glendia FORBES Holt, MD  06/24/2024, 7:45 AM [facility]

## 2024-06-25 ENCOUNTER — Inpatient Hospital Stay (HOSPITAL_COMMUNITY)

## 2024-06-25 ENCOUNTER — Inpatient Hospital Stay (HOSPITAL_COMMUNITY): Admitting: Anesthesiology

## 2024-06-25 ENCOUNTER — Encounter (HOSPITAL_COMMUNITY): Payer: Self-pay | Admitting: Internal Medicine

## 2024-06-25 ENCOUNTER — Encounter (HOSPITAL_COMMUNITY)
Admission: EM | Disposition: A | Discharge status: Home / Self Care | Payer: Self-pay | Source: Home / Self Care | Attending: Internal Medicine

## 2024-06-25 DIAGNOSIS — Z9049 Acquired absence of other specified parts of digestive tract: Secondary | ICD-10-CM

## 2024-06-25 DIAGNOSIS — E785 Hyperlipidemia, unspecified: Secondary | ICD-10-CM | POA: Diagnosis not present

## 2024-06-25 DIAGNOSIS — I129 Hypertensive chronic kidney disease with stage 1 through stage 4 chronic kidney disease, or unspecified chronic kidney disease: Secondary | ICD-10-CM

## 2024-06-25 DIAGNOSIS — N1831 Chronic kidney disease, stage 3a: Secondary | ICD-10-CM

## 2024-06-25 DIAGNOSIS — K921 Melena: Secondary | ICD-10-CM | POA: Diagnosis not present

## 2024-06-25 DIAGNOSIS — R578 Other shock: Secondary | ICD-10-CM | POA: Diagnosis not present

## 2024-06-25 DIAGNOSIS — Z86711 Personal history of pulmonary embolism: Secondary | ICD-10-CM | POA: Diagnosis not present

## 2024-06-25 DIAGNOSIS — K9411 Enterostomy hemorrhage: Secondary | ICD-10-CM | POA: Diagnosis not present

## 2024-06-25 DIAGNOSIS — Z7901 Long term (current) use of anticoagulants: Secondary | ICD-10-CM

## 2024-06-25 DIAGNOSIS — K922 Gastrointestinal hemorrhage, unspecified: Secondary | ICD-10-CM | POA: Diagnosis not present

## 2024-06-25 DIAGNOSIS — D62 Acute posthemorrhagic anemia: Secondary | ICD-10-CM | POA: Diagnosis not present

## 2024-06-25 HISTORY — PX: SMART PILL PROCEDURE: SHX6496

## 2024-06-25 HISTORY — PX: ILEOSCOPY: SHX5434

## 2024-06-25 LAB — CBC WITH DIFFERENTIAL/PLATELET
Abs Immature Granulocytes: 0.03 K/uL (ref 0.00–0.07)
Basophils Absolute: 0 K/uL (ref 0.0–0.1)
Basophils Relative: 0 %
Eosinophils Absolute: 0.3 K/uL (ref 0.0–0.5)
Eosinophils Relative: 4 %
HCT: 29.8 % — ABNORMAL LOW (ref 36.0–46.0)
Hemoglobin: 9.8 g/dL — ABNORMAL LOW (ref 12.0–15.0)
Immature Granulocytes: 0 %
Lymphocytes Relative: 18 %
Lymphs Abs: 1.4 K/uL (ref 0.7–4.0)
MCH: 27.4 pg (ref 26.0–34.0)
MCHC: 32.9 g/dL (ref 30.0–36.0)
MCV: 83.2 fL (ref 80.0–100.0)
Monocytes Absolute: 0.5 K/uL (ref 0.1–1.0)
Monocytes Relative: 6 %
Neutro Abs: 5.7 K/uL (ref 1.7–7.7)
Neutrophils Relative %: 72 %
Platelets: 215 K/uL (ref 150–400)
RBC: 3.58 MIL/uL — ABNORMAL LOW (ref 3.87–5.11)
RDW: 15.2 % (ref 11.5–15.5)
WBC: 8 K/uL (ref 4.0–10.5)
nRBC: 0 % (ref 0.0–0.2)

## 2024-06-25 LAB — HEPATIC FUNCTION PANEL
ALT: 10 U/L (ref 0–44)
AST: 15 U/L (ref 15–41)
Albumin: 2.8 g/dL — ABNORMAL LOW (ref 3.5–5.0)
Alkaline Phosphatase: 56 U/L (ref 38–126)
Bilirubin, Direct: <0.1 mg/dL (ref 0.0–0.2)
Total Bilirubin: 0.8 mg/dL (ref 0.0–1.2)
Total Protein: 5.7 g/dL — ABNORMAL LOW (ref 6.5–8.1)

## 2024-06-25 LAB — BASIC METABOLIC PANEL WITH GFR
Anion gap: 10 (ref 5–15)
BUN: 11 mg/dL (ref 8–23)
CO2: 25 mmol/L (ref 22–32)
Calcium: 8.8 mg/dL — ABNORMAL LOW (ref 8.9–10.3)
Chloride: 103 mmol/L (ref 98–111)
Creatinine, Ser: 0.97 mg/dL (ref 0.44–1.00)
GFR, Estimated: >60 mL/min (ref >60)
Glucose, Bld: 95 mg/dL (ref 70–99)
Potassium: 2.9 mmol/L — ABNORMAL LOW (ref 3.5–5.1)
Sodium: 138 mmol/L (ref 135–145)

## 2024-06-25 LAB — PHOSPHORUS: Phosphorus: 3.7 mg/dL (ref 2.5–4.6)

## 2024-06-25 LAB — MAGNESIUM: Magnesium: 1.8 mg/dL (ref 1.7–2.4)

## 2024-06-25 SURGERY — ILEOSCOPY, THROUGH STOMA
Anesthesia: Monitor Anesthesia Care

## 2024-06-25 MED ORDER — PROPOFOL 10 MG/ML IV BOLUS
INTRAVENOUS | Status: DC | PRN
  Administered 2024-06-25: 20 mg via INTRAVENOUS

## 2024-06-25 MED ORDER — OXYCODONE HCL 5 MG/5ML PO SOLN: 5.0000 mg | Freq: Once | ORAL | Status: DC | PRN

## 2024-06-25 MED ORDER — ACETAMINOPHEN 325 MG PO TABS
650.0000 mg | ORAL_TABLET | Freq: Once | ORAL | Status: AC
  Administered 2024-06-25: 650 mg via ORAL
  Filled 2024-06-25: qty 2

## 2024-06-25 MED ORDER — POTASSIUM CHLORIDE 20 MEQ PO PACK
40.0000 meq | PACK | Freq: Once | ORAL | Status: AC
  Administered 2024-06-25: 40 meq via ORAL
  Filled 2024-06-25 (×2): qty 2

## 2024-06-25 MED ORDER — FENTANYL CITRATE (PF) 100 MCG/2ML IJ SOLN: 25.0000 ug | INTRAMUSCULAR | Status: DC | PRN

## 2024-06-25 MED ORDER — OXYCODONE HCL 5 MG PO TABS: 5.0000 mg | ORAL_TABLET | Freq: Once | ORAL | Status: DC | PRN

## 2024-06-25 MED ORDER — PROPOFOL 500 MG/50ML IV EMUL
INTRAVENOUS | Status: DC | PRN
  Administered 2024-06-25: 125 ug/kg/min via INTRAVENOUS

## 2024-06-25 MED ORDER — ACETAMINOPHEN 10 MG/ML IV SOLN: 1000.0000 mg | Freq: Once | INTRAVENOUS | Status: DC | PRN

## 2024-06-25 MED ORDER — ONDANSETRON HCL 4 MG/2ML IJ SOLN: 4.0000 mg | Freq: Once | INTRAMUSCULAR | Status: DC | PRN

## 2024-06-25 MED ORDER — POTASSIUM CHLORIDE 10 MEQ/50ML IV SOLN
10.0000 meq | INTRAVENOUS | Status: AC
  Administered 2024-06-25 (×4): 10 meq via INTRAVENOUS
  Filled 2024-06-25 (×4): qty 50

## 2024-06-25 NOTE — Progress Notes (Addendum)
 Latasha Wood, MRN:  [id number], DOB:  1956-06-21, LOS: 2 ADMISSION DATE:  06/23/2024, CONSULTATION DATE:  8/15 REFERRING MD:  Earley, CHIEF COMPLAINT:  hemorrhagic shock    History of Present Illness:  68 year old female patient with a complicated history of ulcerative pancolitis initially diagnosed at age 12 and status post initial colectomy in 2016, which has been complicated by postoperative fistulas, small bowel obstruction as well as high ostomy output which had been fully cleared over time.  Recently admitted June 12 through June 15 for pulmonary emboli and DVT discharged to home on Eliquis .  Readmitted June 24 through June 27 for dark-colored ostomy output.  Hemoglobin on presentation that time was 6.8 she had a CT angiogram at that point which was not diagnostic.  The bleeding subsided without intervention and she was eventually placed back on her DOAC and discharged home  Went to [facility] on 8/14 for ileoscopy to evaluate recurrent bleeding.  The ileostomy appeared normal on this exam there was no superficial breakdown there is no inflammation and obvious cause for bleeding  Later that evening the patient once again reported bloody output from the ileostomy.  Her nighttime Eliquis  was held and she was referred to the Emergency room.  She continued to have multiple episodes of bloody output in her ileostomy bag, reporting 9 episodes.  Was starting to feel dizzy, so presented to the emergency room.  Initially in the ER pulse 97 blood pressure 131/90.  Hemoglobin was 10 2 hematocrit 31.4 platelet count 366, potassium 3.2 BUN 26 creatinine 1.06 a CT angiogram of abdomen pelvis was obtained showed a probable accumulation of high density material seen and dilated segment of the small bowel near the surgical anastomosis site.  This was felt possibly the focal point of gastrointestinal bleeding subsequently again seen by gastroenterology in consult at the request of the ER  team.  Initial plans were to continue on IV hydration, type and cross, and monitor the patient in the intensive care.  With recommendations to reverse in a anticoagulation, and also consulted interventional radiology.  Shortly after arrival to the medical floor the patient became unresponsive with palpable blood pressure recorded in the 40s.  She was administered rapid crystalloid resuscitation, her first unit of blood was hung, and she was placed in reverse Trendelenburg.  Subsequently transferred to the floor, critical care services asked to evaluate and assume care.  Interventional radiology already aware of clinical decline with plan to bring to angiogram suite for possible embolization  Pertinent  Medical History  Ulcerative pancolitis status post an elective colectomy back in November 2016 this has been complicated by postoperative fistulas requiring revision x 2, high ostomy output, small bowel obstruction, and prior GI bleed.  Just recently diagnosed and placed on DOAC for DVT/PE. Hyperlipidemia Hypertension CKD Dyspnea Prior abdominal hysterectomy Significant Hospital Events: Including procedures, antibiotic start and stop dates in addition to other pertinent events   8/15 admitted w/bloody output from ileostomy (several episodes) initially hemodynamically stable w/ Hgb >10. Had on-going bloody output. On arrival to medical floor had acute LOC w/ SBP in 40s. 2 liters crystalloid administered. Blood started. Got K centra, IV TXA, left femoral cortis placed. IR consulted and plan to go to angiogram. No active bleed noted, no embolization performed.  8/16 H/H remained stable  Interim History / Subjective:   No acute issues overnight Update patient at bedside and family via facetime Discussed IVC filter vs returning to anticoagulation, patient requested discussing with  her hematology team as well.   Completed iloscopy this morning with limited visualization. Plan to do barium tablet for  patency evaluation and proceed with pill camera next if tablet passes through GI tract.   Objective    Blood pressure 113/65, pulse 67, temperature 98.4 F (36.9 C), temperature source Oral, resp. rate 10, height 5' 3 (1.6 m), weight 76.7 kg, SpO2 98%.        Intake/Output Summary (Last 24 hours) at 06/25/2024 9270 Last data filed at 06/25/2024 9278 Gross per 24 hour  Intake 249.67 ml  Output 4025 ml  Net -3775.33 ml   Filed Weights   06/24/24 1725  Weight: 76.7 kg    Examination: General: 68 year old woman, no distress, laying in bed HENT: [place]/AT, moist mucous membranes Lungs: Clear to auscultation, no wheezing Cardiovascular: Regular rate and rhythm without murmur rub or gallop Abdomen: Soft, non-tender, ostomy present Extremities: warm, no edema Neuro: Oriented GU: foley in place   Resolved problem list  Chest pain  Assessment and Plan   Hemorrhagic Shock with syncopal episode Recurrent Upper GI Bleed in setting of DOAC Hx of ulcerative colitis s/p total colectomy and end ileostomy Plan - Transfused 2 units PRBCs 8/15. Given Kcentra  and tranexamic acid . - IR consulted for arterial embolization 8/15, but not source located  - GI is following, Ileoscopy with no bleeding. Limited to 30cm of bowel visualized. Plan for camera pill 8/18 if patency test successful today. - Trend H/H, transfuse for hemoglobin 7g/dL or less - continue PPI IV twice daily - hold anticoagulation. Will need to consult her hematology team to weigh in on IVC vs return to anticoagulation. - plan to remove left femoral CVL today  Acute blood loss anemia secondary to upper GI bleed Plan - See above  Stage IIIa CKD.  Baseline serum creatinine 0.9-1.2 Certainly at risk for hypoperfusion injury as well as contrast-induced ATN Plan - Holding antihypertensives - Ensure euvolemia - Renal dose medications as indicated - Strict intake and output  History of DVT and pulmonary emboli on  Eliquis  -June 13 lower extremity ultrasound showed DVT and there right peroneal veins as well as acute nonocclusive DVT in the left proximal femoral vein and profunda.  CT chest at that time showed bilateral acute pulmonary emboli without evidence of heart strain Plan - Holding her anticoagulation in the setting of life-threatening bleeding - We will repeat lower extremity ultrasounds - Will need to decide on safety of resuming anticoagulation versus IVC filter  Fluid and electrolyte imbalance: Hypokalemia Plan - Replace and recheck  History of ulcerative colitis, with remote ileostomy, and complicated surgical history with prior fistulas and high output ostomy Plan - Being followed by GI  History of thyroid  mass Plan - Follow-up outpatient   Best Practice (right click and Reselect all SmartList Selections daily)   Diet/type: clear liquids DVT prophylaxis SCD Pressure ulcer(s): N/A GI prophylaxis: PPI Lines: Central line Foley:  Yes, and it is still needed Code Status:  full code Last date of multidisciplinary goals of care discussion [pending]  Labs   CBC: Recent Labs  Lab 06/20/24 0945 06/23/24 1107 06/23/24 1536 06/23/24 2025 06/24/24 0243 06/24/24 0618 06/24/24 1826 06/25/24 0629  WBC 6.8 8.3  --   --   --   --  8.8 8.0  NEUTROABS 4.4  --   --   --   --   --  5.9 5.7  HGB 11.4* 10.2*   < > 10.9* 10.1* 10.2* 9.7* 9.8*  HCT 34.5* 31.4*   < > 35.3* 30.9* 35.5* 29.5* 29.8*  MCV 83.7 84.2  --   --   --   --  83.3 83.2  PLT 346 366  --   --   --   --  229 215   < > = values in this interval not displayed.    Basic Metabolic Panel: Recent Labs  Lab 06/20/24 0945 06/23/24 1107 06/24/24 0618  NA 138 138 145  K 3.8 3.2* 4.0  CL 103 102 107  CO2 28 26 27   GLUCOSE 90 104* 112*  BUN 19 26* 25*  CREATININE 1.20* 1.06* 0.39*  CALCIUM  9.2 9.4 8.1*  MG  --   --  1.9   GFR: Estimated Creatinine Clearance: 65.1 mL/min (A) (by C-G formula based on SCr of 0.39  mg/dL (L)). Recent Labs  Lab 06/20/24 0945 06/23/24 1107 06/23/24 2025 06/23/24 2302 06/24/24 1826 06/25/24 0629  WBC 6.8 8.3  --   --  8.8 8.0  LATICACIDVEN  --   --  1.3 1.0  --   --     Liver Function Tests: Recent Labs  Lab 06/20/24 0945 06/23/24 1107 06/24/24 0618  AST 15 20 75*  ALT 9 11 70*  ALKPHOS 86 79 124  BILITOT 0.4 0.6 0.4  PROT 7.4 7.2 5.1*  ALBUMIN  3.9 3.2* 1.7*   No results for input(s): LIPASE, AMYLASE in the last 168 hours. No results for input(s): AMMONIA in the last 168 hours.  ABG    Component Value Date/Time   TCO2 27 11/01/2015 1949     Coagulation Profile: No results for input(s): INR, PROTIME in the last 168 hours.  Cardiac Enzymes: No results for input(s): CKTOTAL, CKMB, CKMBINDEX, TROPONINI in the last 168 hours.  HbA1C: Hgb A1c MFr Bld  Date/Time Value Ref Range Status  04/21/2024 04:58 AM 5.6 4.8 - 5.6 % Final    Comment:    (NOTE)         Prediabetes: 5.7 - 6.4         Diabetes: >6.4         Glycemic control for adults with diabetes: <7.0   03/06/2024 10:20 AM 5.9 4.6 - 6.5 % Final    Comment:    Glycemic Control Guidelines for People with Diabetes:Non Diabetic:  <6%Goal of Therapy: <7%Additional Action Suggested:  >8%     CBG: No results for input(s): GLUCAP in the last 168 hours.   Critical care time: n/a    Dorn Chill, MD [facility] Office: [phone]   See Amion for personal pager PCCM on call pager [phone] until 7pm. Please call Elink 7p-7a. [phone]

## 2024-06-25 NOTE — Anesthesia Postprocedure Evaluation (Signed)
 Anesthesia Post Note  Patient: Latasha Wood  Procedure(s) Performed: ILEOSCOPY, THROUGH STOMA     Patient location during evaluation: PACU Anesthesia Type: MAC Level of consciousness: awake and alert Pain management: pain level controlled Vital Signs Assessment: post-procedure vital signs reviewed and stable Respiratory status: spontaneous breathing, nonlabored ventilation, respiratory function stable and patient connected to nasal cannula oxygen Cardiovascular status: stable and blood pressure returned to baseline Postop Assessment: no apparent nausea or vomiting Anesthetic complications: no   No notable events documented.  Last Vitals:  Vitals:   06/25/24 0845 06/25/24 0900  BP: 109/69   Pulse: 73 68  Resp: 14 11  Temp: 36.8 C   SpO2: 99% 98%    Last Pain:  Vitals:   06/25/24 0845  TempSrc:   PainSc: 0-No pain                 Lynwood MARLA Cornea

## 2024-06-25 NOTE — Anesthesia Preprocedure Evaluation (Signed)
 Anesthesia Evaluation  Patient identified by MRN, date of birth, ID band Patient awake    Reviewed: Allergy & Precautions, NPO status , Patient's Chart, lab work & pertinent test results, reviewed documented beta blocker date and time   History of Anesthesia Complications Negative for: history of anesthetic complications  Airway Mallampati: III  TM Distance: >3 FB     Dental no notable dental hx.    Pulmonary shortness of breath and with exertion, neg COPD, PE (Unprovoked - june 2025)   breath sounds clear to auscultation       Cardiovascular hypertension, (-) Past MI, (-) Cardiac Stents and (-) CABG  Rhythm:Regular Rate:Normal  IMPRESSIONS     1. Left ventricular ejection fraction, by estimation, is 65 to 70%. Left  ventricular ejection fraction by 3D volume is 66 %. The left ventricle has  normal function. The left ventricle has no regional wall motion  abnormalities. Left ventricular diastolic   parameters are consistent with Grade I diastolic dysfunction (impaired  relaxation). The average left ventricular global longitudinal strain is  -19.4 %. The global longitudinal strain is normal.   2. Right ventricular systolic function is normal. The right ventricular  size is normal.   3. The mitral valve is normal in structure. No evidence of mitral valve  regurgitation. No evidence of mitral stenosis.   4. The aortic valve was not well visualized. Aortic valve regurgitation  is not visualized.   5. The inferior vena cava is normal in size with greater than 50%  respiratory variability, suggesting right atrial pressure of 3 mmHg.      Neuro/Psych neg Seizures PSYCHIATRIC DISORDERS Anxiety        GI/Hepatic PUD,GERD  ,,(+) neg Cirrhosis        Endo/Other  diabetes, Type 2    Renal/GU CRFRenal disease     Musculoskeletal  (+) Arthritis ,    Abdominal   Peds  Hematology  (+) Blood dyscrasia, anemia   Anesthesia  Other Findings   Reproductive/Obstetrics                              Anesthesia Physical Anesthesia Plan  ASA: 3  Anesthesia Plan: MAC   Post-op Pain Management:    Induction: Intravenous  PONV Risk Score and Plan: 2 and Ondansetron  and Propofol  infusion  Airway Management Planned: Natural Airway and Simple Face Mask  Additional Equipment:   Intra-op Plan:   Post-operative Plan:   Informed Consent: I have reviewed the patients History and Physical, chart, labs and discussed the procedure including the risks, benefits and alternatives for the proposed anesthesia with the patient or authorized representative who has indicated his/her understanding and acceptance.     Dental advisory given  Plan Discussed with: CRNA  Anesthesia Plan Comments:          Anesthesia Quick Evaluation

## 2024-06-25 NOTE — Progress Notes (Signed)
 Bilateral lower extremity venous duplex has been completed. Preliminary results can be found in CV Proc through chart review.   06/25/24 10:57 AM Cathlyn Collet RVT

## 2024-06-25 NOTE — OR Nursing (Signed)
 Pt ingested Patency Pill at 11:00am. Pt and primary RN verbalized understanding. Pt given pt specific handbook/instructions and knows she is able to call ENDO RN if any questions. X-Ray will need to be completed tomorrow before 30hours after ingestion - RN verbalized understanding. GI Dr.Cunningham aware of ingestion time. Collene JAYSON Edu [place]

## 2024-06-25 NOTE — Interval H&P Note (Signed)
 History and Physical Interval Note:  06/25/2024 7:35 AM  Latasha Wood  has presented today for surgery, with the diagnosis of Anemia; Bleeding from ileoscopy.  The various methods of treatment have been discussed with the patient and family. After consideration of risks, benefits and other options for treatment, the patient has consented to  Procedure(s): ILEOSCOPY, THROUGH STOMA (N/A) as a surgical intervention.  The patient's history has been reviewed, patient examined, no change in status, stable for surgery.  I have reviewed the patient's chart and labs.  Questions were answered to the patient's satisfaction.     Glendia FORBES Holt

## 2024-06-25 NOTE — Op Note (Signed)
 [facility] Patient Name: Latasha Wood Procedure Date: 06/25/2024 MRN: [id number] Attending MD: Glendia BRAVO. Stacia , MD, [id number] Date of Birth: 06-May-1956 CSN: [id number] Age: 68 Admit Type: Inpatient Procedure:                Ileoscopy Indications:              History of total colectomy, Gastrointestinal                            bleeding, iron  deficiency anemia Providers:                Glendia E. Stacia, MD, Collene Edu, RN, [facility]                            [place], Technician, Hoy Sharps, CRNA Referring MD:              Medicines:                Monitored Anesthesia Care Complications:            No immediate complications. Estimated Blood Loss:     Estimated blood loss: none. Procedure:                Pre-Anesthesia Assessment:                           - Prior to the procedure, a History and Physical                            was performed, and patient medications and                            allergies were reviewed. The patient's tolerance of                            previous anesthesia was also reviewed. The risks                            and benefits of the procedure and the sedation                            options and risks were discussed with the patient.                            All questions were answered, and informed consent                            was obtained. Prior Anticoagulants: The patient has                            taken Eliquis  (apixaban ), last dose was 3 days                            prior to procedure. ASA Grade Assessment: III - A  patient with severe systemic disease. After                            reviewing the risks and benefits, the patient was                            deemed in satisfactory condition to undergo the                            procedure.                           After I obtained informed consent, the scope was                            passed under  direct vision. Throughout the                            procedure, the patient's blood pressure, pulse, and                            oxygen saturations were monitored continuously. The                            [id number]-[id number] ([id number]) Olympus endoscope was introduced                            through the ileostomy and advanced to the 25 cm                            into the ileum. After I obtained informed consent,                            the scope was passed under direct vision.                            Throughout the procedure, the patient's blood                            pressure, pulse, and oxygen saturations were                            monitored continuously. The [id number]-[id number] ([id number])                            Olympus endoscope was introduced through the                            ileostomy and advanced to 30 cm into the ileum. The                            ileoscopy was performed without difficulty. The  patient tolerated the procedure well. The quality                            of the bowel preparation was excellent. Scope In: 7:57:17 AM Scope Out: 8:11:21 AM Total Procedure Duration: 0 hours 14 minutes 4 seconds  Findings:      Patient is status-post total colectomy with an end ileostomy. The stoma       appeared healthy but narrowed; only the tip of the index finger was able       to be inserted. There was some mild oozing following digital examination      The examined ileum appeared normal. There was a focal area of erythema,       likely secondary to scope trauma seen on withdrawal. Using the standard       gastroscope, resistance was encountered at 25 cm and examination past       this point was not performed. The ultraslim upper endoscope was then       used, but again was only able to be intubated 30 cm before looping       occurred. Abdominal pressure was applied to help facilitate scope       advancement, but was  ineffective. Impression:               - The examined portion of the ileum was normal. No                            possible bleeding sources identified on ileoscopy,                            although only distal 30 cm visualized.                           - No specimens collected. Moderate Sedation:      Not Applicable - Patient had care per Anesthesia. Recommendation:           - Return patient to hospital ward for ongoing care.                           - NPO for 2 hours.                           - To visualize the small bowel, perform video                            capsule endoscopy at appointment to be scheduled.                           - Because of concern for possible capsule                            retention/obstruction, recommend a patency capsule                            study be performed first.                           -  Patient will ingest patency capsule today, remain                            NPO for 2 hours, then resume clears. Okay to resume                            regular diet four hours after capsule ingestion.                           - Obtain plain film of the abdomen 24-30 hours                            after ingestion of capsule.                           - If capsule not present on plain film, will plan                            for video capsule endoscopy. This can be done as                            outpatient if needed.                           - Probably best to continue to hold Eliquis  for                            now, but if primary team feels this needs to be                            restarted, ok from GI standpoint. Procedure Code(s):        --- Professional ---                           [phone], Ileoscopy, through stoma; diagnostic,                            including collection of specimen(s) by brushing or                            washing, when performed (separate procedure) Diagnosis Code(s):        --- Professional ---                            Z90.49, Acquired absence of other specified parts                            of digestive tract                           K92.2, Gastrointestinal hemorrhage, unspecified CPT copyright 2022 American Medical Association. All rights reserved. The codes documented in this report are preliminary and upon coder review may  be revised to meet current compliance requirements. Jamie-Lee Galdamez E. Stacia, MD 06/25/2024  8:34:33 AM This report has been signed electronically. Number of Addenda: 0

## 2024-06-25 NOTE — Transfer of Care (Signed)
 Immediate Anesthesia Transfer of Care Note  Patient: Latasha Wood  Procedure(s) Performed: ILEOSCOPY, THROUGH STOMA  Patient Location: PACU  Anesthesia Type:MAC  Level of Consciousness: awake and patient cooperative  Airway & Oxygen Therapy: Patient Spontanous Breathing  Post-op Assessment: Report given to RN and Post -op Vital signs reviewed and stable  Post vital signs: Reviewed and stable  Last Vitals:  Vitals Value Taken Time  BP 106/66 06/25/24 08:22  Temp 36.3 C 06/25/24 08:22  Pulse 76 06/25/24 08:27  Resp 12 06/25/24 08:27  SpO2 98 % 06/25/24 08:27  Vitals shown include unfiled device data.  Last Pain:  Vitals:   06/25/24 0822  TempSrc:   PainSc: Asleep         Complications: No notable events documented.

## 2024-06-25 NOTE — Progress Notes (Signed)
 eLink Physician-Brief Progress Note Patient Name: Latasha Wood DOB: [birth year] MRN: [id number]   Date of Service  06/25/2024  HPI/Events of Note  Request for tylenol  for headache.   eICU Interventions  One time dose ordered, noted some rise in LFT yesterday Also ordered LFT at 5 am with other lab s     Intervention Category Intermediate Interventions: Pain - evaluation and management  Chamara Dyck G Mikella Linsley 06/25/2024, 4:28 AM

## 2024-06-26 ENCOUNTER — Encounter: Payer: Self-pay | Admitting: Hematology and Oncology

## 2024-06-26 ENCOUNTER — Inpatient Hospital Stay (HOSPITAL_COMMUNITY)

## 2024-06-26 DIAGNOSIS — I2699 Other pulmonary embolism without acute cor pulmonale: Secondary | ICD-10-CM | POA: Diagnosis not present

## 2024-06-26 DIAGNOSIS — I2694 Multiple subsegmental pulmonary emboli without acute cor pulmonale: Secondary | ICD-10-CM | POA: Diagnosis not present

## 2024-06-26 DIAGNOSIS — R935 Abnormal findings on diagnostic imaging of other abdominal regions, including retroperitoneum: Secondary | ICD-10-CM | POA: Diagnosis not present

## 2024-06-26 DIAGNOSIS — T183XXA Foreign body in small intestine, initial encounter: Secondary | ICD-10-CM | POA: Diagnosis not present

## 2024-06-26 DIAGNOSIS — R578 Other shock: Secondary | ICD-10-CM | POA: Diagnosis not present

## 2024-06-26 DIAGNOSIS — D62 Acute posthemorrhagic anemia: Secondary | ICD-10-CM

## 2024-06-26 DIAGNOSIS — D509 Iron deficiency anemia, unspecified: Secondary | ICD-10-CM | POA: Diagnosis not present

## 2024-06-26 DIAGNOSIS — K922 Gastrointestinal hemorrhage, unspecified: Secondary | ICD-10-CM | POA: Diagnosis not present

## 2024-06-26 LAB — BPAM RBC
Blood Product Expiration Date: 202508292359
Blood Product Expiration Date: 202509152359
ISSUE DATE / TIME: 202508151551
ISSUE DATE / TIME: 202508151703
Unit Type and Rh: 1700
Unit Type and Rh: 1700

## 2024-06-26 LAB — CBC WITH DIFFERENTIAL/PLATELET
Abs Immature Granulocytes: 0.02 K/uL (ref 0.00–0.07)
Basophils Absolute: 0 K/uL (ref 0.0–0.1)
Basophils Relative: 0 %
Eosinophils Absolute: 0.3 K/uL (ref 0.0–0.5)
Eosinophils Relative: 3 %
HCT: 29.7 % — ABNORMAL LOW (ref 36.0–46.0)
Hemoglobin: 9.6 g/dL — ABNORMAL LOW (ref 12.0–15.0)
Immature Granulocytes: 0 %
Lymphocytes Relative: 19 %
Lymphs Abs: 1.9 K/uL (ref 0.7–4.0)
MCH: 27.4 pg (ref 26.0–34.0)
MCHC: 32.3 g/dL (ref 30.0–36.0)
MCV: 84.6 fL (ref 80.0–100.0)
Monocytes Absolute: 0.7 K/uL (ref 0.1–1.0)
Monocytes Relative: 7 %
Neutro Abs: 6.7 K/uL (ref 1.7–7.7)
Neutrophils Relative %: 71 %
Platelets: 222 K/uL (ref 150–400)
RBC: 3.51 MIL/uL — ABNORMAL LOW (ref 3.87–5.11)
RDW: 15.1 % (ref 11.5–15.5)
WBC: 9.6 K/uL (ref 4.0–10.5)
nRBC: 0 % (ref 0.0–0.2)

## 2024-06-26 LAB — TYPE AND SCREEN
ABO/RH(D): B POS
Antibody Screen: NEGATIVE
Unit division: 0
Unit division: 0

## 2024-06-26 LAB — ECHOCARDIOGRAM COMPLETE
Area-P 1/2: 3.6 cm2
Height: 63 in
S' Lateral: 2.1 cm
Weight: 2705.49 [oz_av]

## 2024-06-26 LAB — BASIC METABOLIC PANEL WITH GFR
Anion gap: 7 (ref 5–15)
BUN: 21 mg/dL (ref 8–23)
CO2: 23 mmol/L (ref 22–32)
Calcium: 8.3 mg/dL — ABNORMAL LOW (ref 8.9–10.3)
Chloride: 106 mmol/L (ref 98–111)
Creatinine, Ser: 0.93 mg/dL (ref 0.44–1.00)
GFR, Estimated: >60 mL/min (ref >60)
Glucose, Bld: 121 mg/dL — ABNORMAL HIGH (ref 70–99)
Potassium: 3.2 mmol/L — ABNORMAL LOW (ref 3.5–5.1)
Sodium: 136 mmol/L (ref 135–145)

## 2024-06-26 LAB — PHOSPHORUS: Phosphorus: 3.2 mg/dL (ref 2.5–4.6)

## 2024-06-26 LAB — MAGNESIUM: Magnesium: 1.7 mg/dL (ref 1.7–2.4)

## 2024-06-26 MED ORDER — POTASSIUM CHLORIDE 10 MEQ/100ML IV SOLN
10.0000 meq | INTRAVENOUS | Status: DC
  Administered 2024-06-26: 10 meq via INTRAVENOUS
  Filled 2024-06-26: qty 100

## 2024-06-26 MED ORDER — POTASSIUM CHLORIDE CRYS ER 20 MEQ PO TBCR
20.0000 meq | EXTENDED_RELEASE_TABLET | ORAL | Status: AC
  Administered 2024-06-26 (×2): 20 meq via ORAL
  Filled 2024-06-26 (×2): qty 1

## 2024-06-26 MED ORDER — MAGNESIUM SULFATE 2 GM/50ML IV SOLN
2.0000 g | Freq: Once | INTRAVENOUS | Status: AC
  Administered 2024-06-26: 2 g via INTRAVENOUS
  Filled 2024-06-26: qty 50

## 2024-06-26 MED ORDER — IRON SUCROSE 200 MG IVPB - SIMPLE MED
200.0000 mg | Freq: Once | Status: AC
  Administered 2024-06-27: 200 mg via INTRAVENOUS
  Filled 2024-06-26: qty 200

## 2024-06-26 MED ORDER — IOHEXOL 350 MG/ML SOLN
75.0000 mL | Freq: Once | INTRAVENOUS | Status: AC | PRN
  Administered 2024-06-26: 75 mL via INTRAVENOUS

## 2024-06-26 MED ORDER — ACETAMINOPHEN 325 MG PO TABS
650.0000 mg | ORAL_TABLET | Freq: Four times a day (QID) | ORAL | Status: DC | PRN
  Administered 2024-06-26 – 2024-06-28 (×4): 650 mg via ORAL
  Filled 2024-06-26 (×4): qty 2

## 2024-06-26 MED ORDER — IRON SUCROSE 200 MG IVPB - SIMPLE MED: 200.0000 mg | Freq: Once | Status: DC

## 2024-06-26 NOTE — Progress Notes (Addendum)
 PROGRESS NOTE  Latasha Wood  FMW:[id number] DOB: [birth year] DOA: 06/23/2024 PCP: Joshua Debby CROME, MD   Brief Narrative: Patient is a 68 year old female with history of ulcerative pancolitis status post colectomy, postoperative fistulas, small bowel obstruction, high ostomy output, PE/DVT on Eliquis  who presented from home with complaint of bloody output from ileostomy.  She was admitted on June 24 for the same problem.  Report of multiple episodes of bloody output in the ileostomy bag, dizziness.  On presentation hemoglobin was 10.2.  CT abdomen/pelvis showed accumulation of high density material and dilated segment of small bowel near the surgical anastomotic site.  Suspected GI bleed.  GI consulted.  After admission, she became unresponsive and hypotensive.  Patient transferred to ICU service, given blood transfusion.  Currently hemoglobin stable, she is hemodynamically stable.  Transferred back to [facility] service on 8/18.  GI consulted and following  Assessment & Plan:  Principal Problem:   GI bleed Active Problems:   Hemorrhagic shock (HCC)   Stage 3a chronic kidney disease (HCC)   History of total colectomy   Chronic anticoagulation   History of pulmonary embolism  Hemorrhagic shock /syncope secondary recurrent upper GI bleed in the setting of anticoagulation: Presented with complaint of bloody output from ileostomy.  Report of multiple episodes of bloody output in the ileostomy bag, dizziness.  Became hypotensive unresponsive during this hospitalization and had to be transferred to ICU.  Given 2 units of PRBC.  Given Kcentra , tranexamic initiated.  IR was consulted for arterial embolization on 8/15 and underwent mesenteric aortography but no source was located.  GI following.  Ileoscopy did not show any bleeding.  Plan for video capsule endoscopy.  Currently hemoglobin is stable.  Currently on PPI, continue the same.  Acute blood loss secondary to upper GI bleed: Given 2 units of  transfusion during this hospitalization.  Currently hemoglobin stable.  Continue to monitor.  CKD stage IIIa: Currently kidney function at baseline.  History of DVT/PE: Was taking Eliquis  at home.  She was previously found to have DVT, acute bilateral PE without evidence of heart strain.  Currently Eliquis  on hold.  Decision to be made whether she needs to be continued on Eliquis  or IVC filter placed.  She follows with Dr Federico.  Will contact Dr. Federico  Hypokalemia: Currently being monitored and supplemented as needed.  History of high output from ileostomy  History of ulcerative colitis/ileostomy: Follows with GI  History of thyroid  mass: Should be follow-up as an outpatient  Addendum: CT angiogram done today showed new emboli in the left upper lobe lingular branch and left interlobar pulmonary arteries.  No evidence of right heart strain.  Echo will be done.  Have sent message to hematology/oncology( Dr Federico) a lot of findings.  Patient is asymptomatic         DVT prophylaxis:None     Code Status: Prior  Family Communication: None at bedside  Patient status:Inpatient  Patient is from :Home  Anticipated discharge un:ynfz  Estimated DC date: After full workup   Consultants: GI, PCCM, radiology  Procedures: Mesenteric arteriography  Antimicrobials:  Anti-infectives (From admission, onward)    None       Subjective:  Patient seen and examined at bedside today.  Hemodynamically stable.  Eating her breakfast.  No abdominal pain, nausea or vomiting.    Ileostomy with brown stool.Took video capsule already  Objective: Vitals:   06/25/24 1938 06/25/24 2345 06/26/24 0437 06/26/24 0620  BP:    131/62  Pulse:  85  77  Resp:  14  14  Temp: 98.1 F (36.7 C) 98.1 F (36.7 C) 97.9 F (36.6 C)   TempSrc: Oral Oral Oral   SpO2:  100%  93%  Weight:      Height:        Intake/Output Summary (Last 24 hours) at 06/26/2024 0728 Last data filed at 06/26/2024  9364 Gross per 24 hour  Intake 430.03 ml  Output 450 ml  Net -19.97 ml   Filed Weights   06/24/24 1725  Weight: 76.7 kg    Examination:  General exam: Overall comfortable, not in distress HEENT: PERRL Respiratory system:  no wheezes or crackles  Cardiovascular system: S1 & S2 heard, RRR.  Gastrointestinal system: Abdomen is nondistended, soft and nontender.  Ileostomy with brown stool Central nervous system: Alert and oriented Extremities: No edema, no clubbing ,no cyanosis Skin: No rashes, no ulcers,no icterus     Data Reviewed: I have personally reviewed following labs and imaging studies  CBC: Recent Labs  Lab 06/20/24 0945 06/23/24 1107 06/23/24 1536 06/24/24 0243 06/24/24 0618 06/24/24 1826 06/25/24 0629 06/26/24 0311  WBC 6.8 8.3  --   --   --  8.8 8.0 9.6  NEUTROABS 4.4  --   --   --   --  5.9 5.7 6.7  HGB 11.4* 10.2*   < > 10.1* 10.2* 9.7* 9.8* 9.6*  HCT 34.5* 31.4*   < > 30.9* 35.5* 29.5* 29.8* 29.7*  MCV 83.7 84.2  --   --   --  83.3 83.2 84.6  PLT 346 366  --   --   --  229 215 222   < > = values in this interval not displayed.   Basic Metabolic Panel: Recent Labs  Lab 06/20/24 0945 06/23/24 1107 06/24/24 0618 06/25/24 0629 06/26/24 0311  NA 138 138 145 138 136  K 3.8 3.2* 4.0 2.9* 3.2*  CL 103 102 107 103 106  CO2 28 26 27 25 23   GLUCOSE 90 104* 112* 95 121*  BUN 19 26* 25* 11 21  CREATININE 1.20* 1.06* 0.39* 0.97 0.93  CALCIUM  9.2 9.4 8.1* 8.8* 8.3*  MG  --   --  1.9 1.8 1.7  PHOS  --   --   --  3.7 3.2     No results found for this or any previous visit (from the past 240 hours).   Radiology Studies: VAS US  LOWER EXTREMITY VENOUS (DVT) Result Date: 06/25/2024  Lower Venous DVT Study Patient Name:  ZYLPHA POYNOR  Date of Exam:   06/25/2024 Medical Rec #: [id number]            Accession #:    [id number] Date of Birth: 04-14-1956             Patient Gender: F Patient Age:   11 years Exam Location:  [facility] Procedure:       VAS US  LOWER EXTREMITY VENOUS (DVT) Referring Phys: DORN CHILL --------------------------------------------------------------------------------  Indications: Pulmonary embolism.  Risk Factors: DVT. Limitations: Bandages, poor ultrasound/tissue interface and line. Comparison Study: 04/21/2024 - RIGHT:                   - Findings consistent with acute deep vein thrombosis                   involving the right peroneal veins.                   -  No cystic structure found in the popliteal fossa.                    LEFT:                   - Findings consistent with acute, non occlusive DVT noted in                   the left proximal femoral vein and profunda vein.                   - No cystic structure found in the popliteal fossa. Performing Technologist: Cordella Collet RVT  Examination Guidelines: A complete evaluation includes B-mode imaging, spectral Doppler, color Doppler, and power Doppler as needed of all accessible portions of each vessel. Bilateral testing is considered an integral part of a complete examination. Limited examinations for reoccurring indications may be performed as noted. The reflux portion of the exam is performed with the patient in reverse Trendelenburg.  +---------+---------------+---------+-----------+----------+--------------+ RIGHT    CompressibilityPhasicitySpontaneityPropertiesThrombus Aging +---------+---------------+---------+-----------+----------+--------------+ CFV      Full           Yes      Yes                                 +---------+---------------+---------+-----------+----------+--------------+ SFJ      Full                                                        +---------+---------------+---------+-----------+----------+--------------+ FV Prox  Full                                                        +---------+---------------+---------+-----------+----------+--------------+ FV Mid   Full                                                         +---------+---------------+---------+-----------+----------+--------------+ FV DistalFull                                                        +---------+---------------+---------+-----------+----------+--------------+ PFV      Full                                                        +---------+---------------+---------+-----------+----------+--------------+ POP      Full           Yes      Yes                                 +---------+---------------+---------+-----------+----------+--------------+  PTV      Full                                                        +---------+---------------+---------+-----------+----------+--------------+ PERO     Full                                                        +---------+---------------+---------+-----------+----------+--------------+   +---------+---------------+---------+-----------+----------+--------------+ LEFT     CompressibilityPhasicitySpontaneityPropertiesThrombus Aging +---------+---------------+---------+-----------+----------+--------------+ CFV      Full           Yes      Yes                                 +---------+---------------+---------+-----------+----------+--------------+ SFJ      Full                                                        +---------+---------------+---------+-----------+----------+--------------+ FV Prox  Full                                                        +---------+---------------+---------+-----------+----------+--------------+ FV Mid   Full                                                        +---------+---------------+---------+-----------+----------+--------------+ FV DistalFull                                                        +---------+---------------+---------+-----------+----------+--------------+ PFV      Full                                                         +---------+---------------+---------+-----------+----------+--------------+ POP      Full           Yes      Yes                                 +---------+---------------+---------+-----------+----------+--------------+ PTV      Full                                                        +---------+---------------+---------+-----------+----------+--------------+  PERO     Full                                                        +---------+---------------+---------+-----------+----------+--------------+     Summary: RIGHT: - There is no evidence of deep vein thrombosis in the lower extremity.  - No cystic structure found in the popliteal fossa.  LEFT: - There is no evidence of deep vein thrombosis in the lower extremity.  - No cystic structure found in the popliteal fossa.  *See table(s) above for measurements and observations. Electronically signed by Penne Colorado MD on 06/25/2024 at 11:42:43 AM.    Final     Scheduled Meds:  Chlorhexidine  Gluconate Cloth  6 each Topical Daily   potassium chloride   20 mEq Oral Q4H   Continuous Infusions:  potassium chloride  20 mL/hr at 06/26/24 0635     LOS: 3 days   Ivonne Mustache, MD [facility] P8/18/2025, 7:28 AM

## 2024-06-26 NOTE — Progress Notes (Addendum)
  I know this patient well as outpatient, just stop by to see her and say hello and check on her. Spoke with the patient and her husband via facetime. She had an ileoscopy with me last Thursday, exam was normal without any concerning findings to cause bleeding.  Several hours later that night she came in with recurrent bleeding and admitted the following day.  Course reviewed.  CTA was positive for active bleeding roughly 40 cm proximal to the ostomy.  I do not believe my ileoscopy intubated the ileum to that extent to see this area.  IR attempt at embolization was not successful, she stopped bleeding at the time.  She was prepped and had repeat ileoscopy with Dr. Stacia, he also did not see any abnormalities but she has a rather restricted small bowel from her prior surgeries and could not get to the site of concern on the CTA.  Patency capsule placed overnight, it appears to be stuck in her bowel and has not passed yet.  Repeating an x-ray tonight.  Unfortunately she is bleeding from an area in the more proximal ileum that is very difficult to get access to endoscopically.  I suspect this has caused both her prior initial bleed in June and her bleeding leading to this admission.  Fortunately with holding anticoagulation she has stopped bleeding, however unfortunately on CT scan it looks like she has new clots in her lungs.  I have discussed her case with Dr. Wilhelmenia, who is overseeing her care from the inpatient GI service this week.  They are discussing with oncology and IR placement of IVC filter,, discussing her case with surgery to get their opinion (CTA reported area was positive at prior surgical anastomosis? she may need operative evaluation given endoscopic limitations as outlined, in the setting of recurrent bleeding with need for anticoagulation).  At some point she will need resumption of anticoagulation once the bleeding issue has been addressed.  Repeating an x-ray in case she happens to pass  the patency capsule, however it seems like based on her course and narrow ostomy that capsule endoscopy will likely not traverse her small bowel.   Very difficult situation.  Awaiting repeat x-ray.  If she has overt significant rebleeding we have recommended CTA.  Appreciate care by inpatient GI team, primary service, oncology.  Marcey Naval, MD [facility]

## 2024-06-26 NOTE — Progress Notes (Signed)
 [facility] Progress Note  CC:  GI bleed  Subjective:  Stools brown this morning.  No fresh red blood in about 24 hours or so.  Objective:  Vital signs in last 24 hours: Temp:  [97.5 F (36.4 C)-98.5 F (36.9 C)] 97.5 F (36.4 C) (08/18 0800) Pulse Rate:  [68-101] 90 (08/18 0800) Resp:  [10-25] 17 (08/18 0800) BP: (109-144)/(62-73) 131/62 (08/18 0620) SpO2:  [93 %-100 %] 100 % (08/18 0800)   General:  Alert, Well-developed, in NAD Heart:  Regular rate and rhythm; no murmurs Pulm:  CTAB.  No W/R/R. Abdomen:  Soft, non-distended.  BS present.  Non-tender.  Ostomy noted. Extremities:  Without edema. Neurologic:  Alert and oriented x 4;  grossly normal neurologically. Psych:  Alert and cooperative. Normal mood and affect.  Intake/Output from previous day: 08/17 0701 - 08/18 0700 In: 449.7 [I.V.:219.7; IV Piggyback:230] Out: 450 [Urine:450] Intake/Output this shift: Total I/O In: 58.1 [IV Piggyback:58.1] Out: -   Lab Results: Recent Labs    06/24/24 1826 06/25/24 0629 06/26/24 0311  WBC 8.8 8.0 9.6  HGB 9.7* 9.8* 9.6*  HCT 29.5* 29.8* 29.7*  PLT 229 215 222   BMET Recent Labs    06/24/24 0618 06/25/24 0629 06/26/24 0311  NA 145 138 136  K 4.0 2.9* 3.2*  CL 107 103 106  CO2 27 25 23   GLUCOSE 112* 95 121*  BUN 25* 11 21  CREATININE 0.39* 0.97 0.93  CALCIUM  8.1* 8.8* 8.3*   LFT Recent Labs    06/25/24 0629  PROT 5.7*  ALBUMIN  2.8*  AST 15  ALT 10  ALKPHOS 56  BILITOT 0.8  BILIDIR <0.1  IBILI NOT CALCULATED   VAS US  LOWER EXTREMITY VENOUS (DVT) Result Date: 06/25/2024  Lower Venous DVT Study Patient Name:  JAQUITTA DUPRIEST  Date of Exam:   06/25/2024 Medical Rec #: [id number]            Accession #:    [id number] Date of Birth: [birth year]             Patient Gender: F Patient Age:   67 years [facility]am Location[facility] Procedure:      VAS US  LOWER EXTREMITY VENOUS (DVT) Referring Phys: DORN CHILL  --------------------------------------------------------------------------------  Indications: Pulmonary embolism.  Risk Factors: DVT. Limitations: Bandages, poor ultrasound/tissue interface and line. Comparison Study: 04/21/2024 - RIGHT:                   - Findings consistent with acute deep vein thrombosis                   involving the right peroneal veins.                   - No cystic structure found in the popliteal fossa.                    LEFT:                   - Findings consistent with acute, non occlusive DVT noted in                   the left proximal femoral vein and profunda vein.                   - No cystic structure found in the popliteal fossa. Performing Technologist: Cordella COLLET RVT  Examination Guidelines: A complete evaluation includes  B-mode imaging, spectral Doppler, color Doppler, and power Doppler as needed of all accessible portions of each vessel. Bilateral testing is considered an integral part of a complete examination. Limited examinations for reoccurring indications may be performed as noted. The reflux portion of the exam is performed with the patient in reverse Trendelenburg.  +---------+---------------+---------+-----------+----------+--------------+ RIGHT    CompressibilityPhasicitySpontaneityPropertiesThrombus Aging +---------+---------------+---------+-----------+----------+--------------+ CFV      Full           Yes      Yes                                 +---------+---------------+---------+-----------+----------+--------------+ SFJ      Full                                                        +---------+---------------+---------+-----------+----------+--------------+ FV Prox  Full                                                        +---------+---------------+---------+-----------+----------+--------------+ FV Mid   Full                                                         +---------+---------------+---------+-----------+----------+--------------+ FV DistalFull                                                        +---------+---------------+---------+-----------+----------+--------------+ PFV      Full                                                        +---------+---------------+---------+-----------+----------+--------------+ POP      Full           Yes      Yes                                 +---------+---------------+---------+-----------+----------+--------------+ PTV      Full                                                        +---------+---------------+---------+-----------+----------+--------------+ PERO     Full                                                        +---------+---------------+---------+-----------+----------+--------------+   +---------+---------------+---------+-----------+----------+--------------+  LEFT     CompressibilityPhasicitySpontaneityPropertiesThrombus Aging +---------+---------------+---------+-----------+----------+--------------+ CFV      Full           Yes      Yes                                 +---------+---------------+---------+-----------+----------+--------------+ SFJ      Full                                                        +---------+---------------+---------+-----------+----------+--------------+ FV Prox  Full                                                        +---------+---------------+---------+-----------+----------+--------------+ FV Mid   Full                                                        +---------+---------------+---------+-----------+----------+--------------+ FV DistalFull                                                        +---------+---------------+---------+-----------+----------+--------------+ PFV      Full                                                         +---------+---------------+---------+-----------+----------+--------------+ POP      Full           Yes      Yes                                 +---------+---------------+---------+-----------+----------+--------------+ PTV      Full                                                        +---------+---------------+---------+-----------+----------+--------------+ PERO     Full                                                        +---------+---------------+---------+-----------+----------+--------------+     Summary: RIGHT: - There is no evidence of deep vein thrombosis in the lower extremity.  - No cystic structure found in the popliteal fossa.  LEFT: - There is no evidence of deep vein thrombosis in the lower extremity.  - No  cystic structure found in the popliteal fossa.  *See table(s) above for measurements and observations. Electronically signed by Penne Colorado MD on 06/25/2024 at 11:42:43 AM.    Final     Assessment / Plan: 68 year old female with history of ulcerative colitis status post total proctocolectomy complicated by ostomy dysfunction in 2016-2017, with recent diagnosis of bilateral pulmonary emboli/DVT in June this year, followed by hospitalization for GI bleed of unclear etiology (negative EGD), ongoing issues with iron  deficiency anemia, with normal ileoscopy as outpatient on August 14, followed by second admission for bleeding August 15.  CTA with suspected bleeding source in the small intestine.  Patient experienced transient hemorrhagic shock evening of August 15, requiring urgent transfusion, volume resuscitation and reversal of anticoagulation with Kcentra , TXA and vitamin K.  Patient was taken to IR suite for mesenteric angiogram, which was negative for active extravasation.  Repeat ileoscopy on 8/17 also with no source of bleeding found.  Patency capsule ingested on 8/17.  GI bleeding, unclear etiology: Hemoglobin stable at 9.6 g today.  Awaiting results of  abdominal x-ray later today to check for patency capsule retention.  If patency capsule okay then will plan for VCE.   LOS: 3 days   Latasha Wood. Jaylinn Hellenbrand  06/26/2024, 8:43 AM

## 2024-06-26 NOTE — Plan of Care (Incomplete)

## 2024-06-26 NOTE — TOC Initial Note (Signed)
 Transition of Care [facility]) - Initial/Assessment Note    Patient Details  Name: Latasha Wood MRN: [id number] Date of Birth: [birth year]  [facility]) CM/SW Contact:    Jon ONEIDA Anon, RN Phone Number: 06/26/2024, 1:30 PM  Clinical Narrative:                 Pt is from home. Continued medical workup, not medically ready to discharge. PT consulted, awaiting any new recommendations. Care Management continuing to follow for any DC needs.  Expected Discharge Plan: Home/Self Care Barriers to Discharge: Continued Medical Work up   Patient Goals and CMS Choice Patient states their goals for this hospitalization and ongoing recovery are:: Return home CMS Medicare.gov Compare Post Acute Care list provided to:: Other (Comment Required) (NA) Choice offered to / list presented to : NA [place] ownership interest in [facility].provided to:: Parent NA    Expected Discharge Plan and Services In-house Referral: NA Discharge Planning Services: CM Consult Post Acute Care Choice: Durable Medical Equipment                   DME Arranged: N/A DME Agency: NA       HH Arranged: NA HH Agency: NA        Prior Living Arrangements/Services   Lives with:: Spouse Patient language and need for interpreter reviewed:: Yes Do you feel safe going back to the place where you live?: Yes      Need for Family Participation in Patient Care: No (Comment) Care giver support system in place?: No (comment) Current home services: DME Criminal Activity/Legal Involvement Pertinent to Current Situation/Hospitalization: No - Comment as needed  Activities of Daily Living   ADL Screening (condition at time of admission) Independently performs ADLs?: Yes (appropriate for developmental age) Is the patient deaf or have difficulty hearing?: No Does the patient have difficulty seeing, even when wearing glasses/contacts?: No Does the patient have difficulty concentrating, remembering, or  making decisions?: No  Permission Sought/Granted Permission sought to share information with : Family Supports Permission granted to share information with : Yes, Verbal Permission Granted  Share Information with NAME: Phakiso, KiKi (Daughter)  [phone]           Emotional Assessment Appearance:: Other (Comment Required (UTA) Attitude/Demeanor/Rapport: Unable to Assess Affect (typically observed): Unable to Assess Orientation: : Oriented to Self, Oriented to  Time, Oriented to Place, Oriented to Situation Alcohol / Substance Use: Not Applicable Psych Involvement: No (comment)  Admission diagnosis:  GI bleed [K92.2] Gastrointestinal hemorrhage, unspecified gastrointestinal hemorrhage type [K92.2] Patient Active Problem List   Diagnosis Date Noted   History of total colectomy 06/25/2024   Chronic anticoagulation 06/25/2024   History of pulmonary embolism 06/25/2024   GI bleed 06/23/2024   Iron  deficiency anemia 06/21/2024   High output ileostomy (HCC) 06/19/2024   At risk for complication of stoma 06/19/2024   Aortic atherosclerosis (HCC) 05/11/2024   Anemia 05/02/2024   Antiphospholipid antibody syndrome (HCC) 04/25/2024   PE (pulmonary thromboembolism) (HCC) 04/21/2024   Pulmonary emboli (HCC) 04/21/2024   Irritant contact dermatitis associated with fecal stoma 03/22/2024   Ileostomy care (HCC) 03/22/2024   Visit for screening mammogram 05/05/2022   Diuretic-induced hypokalemia 04/30/2022   Primary osteoarthritis of both knees 08/07/2021   Stage 3a chronic kidney disease (HCC) 04/18/2021   Intrinsic eczema 10/15/2020   GAD (generalized anxiety disorder) 10/15/2020   Chronic bilateral low back pain without sciatica 10/15/2020   Encounter for general adult medical examination  with abnormal findings 10/15/2020   Estrogen deficiency 10/15/2020   Ileostomy in place [facility]) 01/15/2020   GERD without esophagitis 09/08/2017   Tinnitus of right ear 10/06/2016   Right thyroid   nodule 08/17/2016   Arthropathy in ulcerative colitis (HCC) 03/23/2016   Hemorrhagic shock (HCC) 12/11/2015   Ulcerative colitis (HCC) 09/25/2015   Prediabetes 09/05/2015   Hyperlipidemia LDL goal <70 04/17/2011   Essential hypertension 12/30/2007   PCP:  Joshua Debby CROME, MD Pharmacy:   [facility] [phone] - [place], [place] - [address] AT [facility] [address] [place] [address] Phone: [phone] Fax: [phone]  [facility] - [place], [place] - [address] [place] [address] Phone: [phone] Fax: [phone]     Social Drivers of Health (SDOH) Social History: SDOH Screenings   Food Insecurity: No Food Insecurity (06/24/2024)  Housing: Low Risk  (06/24/2024)  Transportation Needs: No Transportation Needs (06/24/2024)  Utilities: Not At Risk (06/24/2024)  Alcohol Screen: Low Risk  (04/21/2024)  Depression (PHQ2-9): Low Risk  (05/02/2024)  Financial Resource Strain: Low Risk  (04/21/2024)  Physical Activity: Insufficiently Active (04/21/2024)  Social Connections: Socially Integrated (06/24/2024)  Recent Concern: Social Connections - Moderately Isolated (04/14/2024)  Stress: No Stress Concern Present (04/21/2024)  Tobacco Use: Low Risk  (06/25/2024)  Health Literacy: Adequate Health Literacy (04/14/2024)   SDOH Interventions:     Readmission Risk Interventions    06/26/2024    1:19 PM 05/04/2024    1:12 PM 04/21/2024    2:32 PM  Readmission Risk Prevention Plan  Post Dischage Appt  Complete Complete  Medication Screening  Complete Complete  Transportation Screening Complete Complete Complete  PCP or Specialist Appt within 5-7 Days Complete    Home Care Screening Complete    Medication Review (RN CM) Complete

## 2024-06-26 NOTE — Progress Notes (Cosign Needed)
 Latasha Wood   DOB:12/13/1955   FM#:[id number]      ASSESSMENT & PLAN:  Latasha Wood is a 68 year old female patient with hematologic history significant for acute PE/DVT.  She was admitted on 06/24/2024 with concerns for GI bleed.  GI bleed - Unclear etiology per GI - Status post ileoscopy 8/14 as outpatient.  Subsequently taken to IR for mesenteric angiogram.  Status post capsule ingested 8/17 - GI following closely  History of PE/DVT -Diagnosed with acute DVT bilateral LE - Has been on Eliquis  which is currently on hold due to GI bleed - Dopplers 8/17 negative.  Patient concerned about possibility of PE therefore CT angio chest ordered. - Consideration for IVC filter - Hematology/Dr. Federico following closely.  Anemia Iron  deficiency anemia - Hemoglobin 9.6 today - Patient with low iron  stores.  She had an appointment for IV iron  infusion on 8/20 as an outpatient and a second outpatient infusion appointment 8/27.  Ordered IV iron  infusion to be given tomorrow as inpatient and keep the second outpatient appointment for 8/27. - No PRBC transfusion required at this time. - Continue to monitor CBC with differential.   Code Status Full  Subjective:  Patient seen awake alert and oriented x 3 laying in bed.  Denies melena since yesterday.  Denies shortness of breath at this time.  Expresses concern over not having Eliquis  since last Thursday although she is aware for the reason Eliquis  is being held.  No other acute concerns offered.  Objective:   Intake/Output Summary (Last 24 hours) at 06/27/2024 0857 Last data filed at 06/26/2024 1454 Gross per 24 hour  Intake 360 ml  Output --  Net 360 ml     PHYSICAL EXAMINATION: ECOG PERFORMANCE STATUS: 1 - Symptomatic but completely ambulatory  Vitals:   06/27/24 0124 06/27/24 0441  BP: 121/75 111/67  Pulse: 68 68  Resp:  18  Temp: 98.1 F (36.7 C) 98.2 F (36.8 C)  SpO2: 98% 99%   Filed Weights   06/24/24 1725   Weight: 169 lb 1.5 oz (76.7 kg)    GENERAL: alert, no distress and comfortable SKIN: +Pale skin color, texture, turgor are normal, no rashes or significant lesions EYES: normal, conjunctiva are pink and non-injected, sclera clear OROPHARYNX: no exudate, no erythema and lips, buccal mucosa, and tongue normal  NECK: supple, thyroid  normal size, non-tender, without nodularity LYMPH: no palpable lymphadenopathy in the cervical, axillary or inguinal LUNGS: clear to auscultation and percussion with normal breathing effort HEART: regular rate & rhythm and no murmurs and no lower extremity edema ABDOMEN: abdomen soft, non-tender and normal bowel sounds MUSCULOSKELETAL: no cyanosis of digits and no clubbing  PSYCH: alert & oriented x 3 with fluent speech NEURO: no focal motor/sensory deficits   All questions were answered. The patient knows to call the clinic with any problems, questions or concerns.   The total time spent in the appointment was 40 minutes encounter with patient including review of chart and various tests results, discussions about plan of care and coordination of care plan  Norleen ONEIDA Federico IV, MD 06/27/2024 8:57 AM    Labs Reviewed:  Lab Results  Component Value Date   WBC 8.2 06/27/2024   HGB 9.5 (L) 06/27/2024   HCT 30.0 (L) 06/27/2024   MCV 84.7 06/27/2024   PLT 242 06/27/2024   Recent Labs    03/06/24 1020 04/19/24 1518 06/23/24 1107 06/24/24 0618 06/25/24 0629 06/26/24 0311 06/27/24 0440  NA 137   < > 138  145 138 136 138  K 3.9   < > 3.2* 4.0 2.9* 3.2* 3.6  CL 99   < > 102 107 103 106 107  CO2 31   < > 26 27 25 23 24   GLUCOSE 96   < > 104* 112* 95 121* 91  BUN 16   < > 26* 25* 11 21 16   CREATININE 1.12   < > 1.06* 0.39* 0.97 0.93 1.05*  CALCIUM  9.6   < > 9.4 8.1* 8.8* 8.3* 8.5*  GFRNONAA  --    < > 57* >60 >60 >60 58*  PROT 7.3   < > 7.2 5.1* 5.7*  --   --   ALBUMIN  4.0   < > 3.2* 1.7* 2.8*  --   --   AST 32   < > 20 75* 15  --   --   ALT 30   < >  11 70* 10  --   --   ALKPHOS 69   < > 79 124 56  --   --   BILITOT 0.7   < > 0.6 0.4 0.8  --   --   BILIDIR 0.2  --   --   --  <0.1  --   --   IBILI  --   --   --   --  NOT CALCULATED  --   --    < > = values in this interval not displayed.    Studies Reviewed:  ECHOCARDIOGRAM COMPLETE Result Date: 06/26/2024    ECHOCARDIOGRAM REPORT   Patient Name:   Latasha Wood Date of Exam: 06/26/2024 Medical Rec #:  [id number]           Height:       63.0 in Accession #:    [id number]          Weight:       169.1 lb Date of Birth:  1956-09-04            BSA:          1.800 m Patient Age:    68 years            BP:           138/79 mmHg Patient Gender: F                   HR:           63 bpm. Exam Location:  Inpatient Procedure: 2D Echo, Cardiac Doppler and Color Doppler (Both Spectral and Color            Flow Doppler were utilized during procedure). Indications:    pulmonary embolus  History:        Patient has prior history of Echocardiogram examinations, most                 recent 04/22/2024.  Sonographer:    Therisa Crouch Referring Phys: [id number] AMRIT ADHIKARI IMPRESSIONS  1. Left ventricular ejection fraction, by estimation, is 55 to 60%. The left ventricle has normal function. The left ventricle has no regional wall motion abnormalities. There is mild concentric left ventricular hypertrophy. Left ventricular diastolic parameters are consistent with Grade I diastolic dysfunction (impaired relaxation).  2. Right ventricular systolic function is normal. The right ventricular size is normal. There is normal pulmonary artery systolic pressure. The estimated right ventricular systolic pressure is 21.8 mmHg.  3. The mitral valve is normal in structure. Trivial mitral valve regurgitation. No evidence  of mitral stenosis.  4. The aortic valve is tricuspid. Aortic valve regurgitation is not visualized. No aortic stenosis is present.  5. The inferior vena cava is normal in size with greater than 50% respiratory  variability, suggesting right atrial pressure of 3 mmHg. FINDINGS  Left Ventricle: Left ventricular ejection fraction, by estimation, is 55 to 60%. The left ventricle has normal function. The left ventricle has no regional wall motion abnormalities. The left ventricular internal cavity size was normal in size. There is  mild concentric left ventricular hypertrophy. Left ventricular diastolic parameters are consistent with Grade I diastolic dysfunction (impaired relaxation). Right Ventricle: The right ventricular size is normal. No increase in right ventricular wall thickness. Right ventricular systolic function is normal. There is normal pulmonary artery systolic pressure. The tricuspid regurgitant velocity is 2.17 m/s, and  with an assumed right atrial pressure of 3 mmHg, the estimated right ventricular systolic pressure is 21.8 mmHg. Left Atrium: Left atrial size was normal in size. Right Atrium: Right atrial size was normal in size. Pericardium: There is no evidence of pericardial effusion. Mitral Valve: The mitral valve is normal in structure. Trivial mitral valve regurgitation. No evidence of mitral valve stenosis. Tricuspid Valve: The tricuspid valve is normal in structure. Tricuspid valve regurgitation is trivial. Aortic Valve: The aortic valve is tricuspid. Aortic valve regurgitation is not visualized. No aortic stenosis is present. Pulmonic Valve: The pulmonic valve was normal in structure. Pulmonic valve regurgitation is not visualized. Aorta: The aortic root is normal in size and structure. Venous: The inferior vena cava is normal in size with greater than 50% respiratory variability, suggesting right atrial pressure of 3 mmHg. IAS/Shunts: No atrial level shunt detected by color flow Doppler.  LEFT VENTRICLE PLAX 2D LVIDd:         3.40 cm   Diastology LVIDs:         2.10 cm   LV e' medial:    10.60 cm/s LV PW:         1.00 cm   LV E/e' medial:  7.4 LV IVS:        1.10 cm   LV e' lateral:   11.90 cm/s LVOT  diam:     1.90 cm   LV E/e' lateral: 6.6 LVOT Area:     2.84 cm  RIGHT VENTRICLE             IVC RV Basal diam:  2.80 cm     IVC diam: 1.90 cm RV S prime:     11.40 cm/s TAPSE (M-mode): 2.8 cm LEFT ATRIUM             Index        RIGHT ATRIUM           Index LA diam:        2.20 cm 1.22 cm/m   RA Area:     17.40 cm LA Vol (A2C):   51.6 ml 28.66 ml/m  RA Volume:   42.70 ml  23.72 ml/m LA Vol (A4C):   43.9 ml 24.38 ml/m LA Biplane Vol: 47.8 ml 26.55 ml/m   AORTA Ao Root diam: 3.00 cm Ao Asc diam:  3.60 cm MITRAL VALVE               TRICUSPID VALVE MV Area (PHT): 3.60 cm    TR Peak grad:   18.8 mmHg MV Decel Time: 211 msec    TR Vmax:        217.00 cm/s MV E  velocity: 78.00 cm/s MV A velocity: 71.10 cm/s  SHUNTS MV E/A ratio:  1.10        Systemic Diam: 1.90 cm Dalton McleanMD Electronically signed by Ezra Kanner Signature Date/Time: 06/26/2024/9:01:14 PM    Final    DG Abd 1 View Result Date: 06/26/2024 CLINICAL DATA:  Evaluate for foreign body.  Retained capsule. EXAM: ABDOMEN - 1 VIEW COMPARISON:  Abdominal x-ray 06/26/2024. FINDINGS: Previous identified rectangular foreign body overlying the colostomy is no longer seen. Surgical anastomotic staple line is again noted in the pelvis. There is contrast in the bladder and renal collecting systems. A can give a are likely external artifact is overlying the lower right hemipelvis which appears unchanged from prior. External snap artifact overlies the lateral left abdominal wall. No new unexpected radiopaque foreign body identified. Bowel-gas pattern is nonobstructive. No suspicious calcifications are seen. IMPRESSION: Previous identified rectangular foreign body overlying the colostomy is no longer seen. No new unexpected radiopaque foreign body identified. Electronically Signed   By: Greig Pique M.D.   On: 06/26/2024 19:18   CT Angio Chest Pulmonary Embolism (PE) W or WO Contrast Result Date: 06/26/2024 CLINICAL DATA:  History of bilateral DVTs, on  Eliquis  EXAM: CT ANGIOGRAPHY CHEST WITH CONTRAST TECHNIQUE: Multidetector CT imaging of the chest was performed using the standard protocol during bolus administration of intravenous contrast. Multiplanar CT image reconstructions and MIPs were obtained to evaluate the vascular anatomy. RADIATION DOSE REDUCTION: This exam was performed according to the departmental dose-optimization program which includes automated exposure control, adjustment of the mA and/or kV according to patient size and/or use of iterative reconstruction technique. CONTRAST:  75mL OMNIPAQUE  IOHEXOL  350 MG/ML SOLN COMPARISON:  April 20, 2024 FINDINGS: Pulmonary Embolism: Resolution of the acute embolus within the interlobar pulmonary artery in the right lung. Small amount of residual distal segmental/subsegmental embolus in the basilar segments of the right lower lobe. Increasing clot burden within the left upper lobe lingular branch, extending into the interlobar pulmonary artery and into the lobar branch of the left lower lobe. The distal segmental and subsegmental emboli in the left lower lobe on the prior study have resolved. Cardiovascular: No cardiomegaly or pericardial effusion.No aortic aneurysm. Subtle reflux of contrast into the hepatic veins. Mediastinum/Nodes: No mediastinal mass.Similar heterogeneous enlargement of the right thyroid  lobe measuring 4.5 cm in AP dimension.No mediastinal, hilar, or axillary lymphadenopathy. Lungs/Pleura: The midline trachea and bronchi are patent. Posterior bibasilar dependent atelectasis. No focal airspace consolidation, pleural effusion, or pneumothorax. Musculoskeletal: No acute fracture or destructive bone lesion. Multilevel degenerative disc disease of the spine. Osteopenia. Upper Abdomen: No acute abnormality in the partially visualized upper abdomen. Review of the MIP images confirms the above findings. IMPRESSION: 1. Although there has been interval resolution of some of the pulmonary emboli on  the prior study, there are new emboli in the left upper lobe lingular branch and left interlobar pulmonary arteries. While there is subtle reflux of contrast into the hepatic veins, as can be seen in elevated right heart pressures or right heart strain, the RV to LV ratio remains normal and there are no additional findings of right heart strain. Laboratory correlation recommended. If this remains of clinical concern, a follow-up echocardiogram should be considered. 2. No pneumonia, pulmonary edema, or pleural effusion. 3. Similar appearance of the heterogeneously enlarged right thyroid  lobe. If not previously evaluated, a nonemergent thyroid  ultrasound is recommended for further characterization. Critical Value/emergent results were called by telephone at the time of interpretation on 06/26/2024 at 1501  to provider Dr. Jillian, who verbally acknowledged these results. Electronically Signed   By: Rogelia Myers M.D.   On: 06/26/2024 15:01   DG Abd 1 View Result Date: 06/26/2024 CLINICAL DATA:  [id number] Foreign body alimentary tract, subsequent encounter [phone]. EXAM: ABDOMEN - 1 VIEW COMPARISON:  01/16/2020. FINDINGS: The bowel gas pattern is non-obstructive. Bowel anastomotic suture noted overlying the lower sacrum region. No evidence of pneumoperitoneum, within the limitations of a supine film. No acute osseous abnormalities. The soft tissues are within normal limits. Surgical changes, devices, tubes and lines: There is a rectangular 1.3 x 2.0 cm opacity overlying the right lower quadrant region superimposed on colostomy ring. It is unclear whether this is external to the patient. Correlate with physical examination. IMPRESSION: Nonobstructive bowel gas pattern. There is a rectangular 1.3 x 2.0 cm opacity overlying the right lower quadrant region superimposed on colostomy ring. It is unclear whether this is external to the patient. Correlate with physical examination. Electronically Signed   By: Ree Molt  M.D.   On: 06/26/2024 11:40   VAS US  LOWER EXTREMITY VENOUS (DVT) Result Date: 06/25/2024  Lower Venous DVT Study Patient Name:  IVAN MASKELL  Date of Exam:   06/25/2024 Medical Rec #: [id number]            Accession #:    [id number] Date of Birth: 12/20/1955             Patient Gender: F Patient Age:   73 years Exam Location:  [facility] Procedure:      VAS US  LOWER EXTREMITY VENOUS (DVT) Referring Phys: DORN CHILL --------------------------------------------------------------------------------  Indications: Pulmonary embolism.  Risk Factors: DVT. Limitations: Bandages, poor ultrasound/tissue interface and line. Comparison Study: 04/21/2024 - RIGHT:                   - Findings consistent with acute deep vein thrombosis                   involving the right peroneal veins.                   - No cystic structure found in the popliteal fossa.                    LEFT:                   - Findings consistent with acute, non occlusive DVT noted in                   the left proximal femoral vein and profunda vein.                   - No cystic structure found in the popliteal fossa. Performing Technologist: Cordella COLLET RVT  Examination Guidelines: A complete evaluation includes B-mode imaging, spectral Doppler, color Doppler, and power Doppler as needed of all accessible portions of each vessel. Bilateral testing is considered an integral part of a complete examination. Limited examinations for reoccurring indications may be performed as noted. The reflux portion of the exam is performed with the patient in reverse Trendelenburg.  +---------+---------------+---------+-----------+----------+--------------+ RIGHT    CompressibilityPhasicitySpontaneityPropertiesThrombus Aging +---------+---------------+---------+-----------+----------+--------------+ CFV      Full           Yes      Yes                                  +---------+---------------+---------+-----------+----------+--------------+  SFJ      Full                                                        +---------+---------------+---------+-----------+----------+--------------+ FV Prox  Full                                                        +---------+---------------+---------+-----------+----------+--------------+ FV Mid   Full                                                        +---------+---------------+---------+-----------+----------+--------------+ FV DistalFull                                                        +---------+---------------+---------+-----------+----------+--------------+ PFV      Full                                                        +---------+---------------+---------+-----------+----------+--------------+ POP      Full           Yes      Yes                                 +---------+---------------+---------+-----------+----------+--------------+ PTV      Full                                                        +---------+---------------+---------+-----------+----------+--------------+ PERO     Full                                                        +---------+---------------+---------+-----------+----------+--------------+   +---------+---------------+---------+-----------+----------+--------------+ LEFT     CompressibilityPhasicitySpontaneityPropertiesThrombus Aging +---------+---------------+---------+-----------+----------+--------------+ CFV      Full           Yes      Yes                                 +---------+---------------+---------+-----------+----------+--------------+ SFJ      Full                                                        +---------+---------------+---------+-----------+----------+--------------+  FV Prox  Full                                                         +---------+---------------+---------+-----------+----------+--------------+ FV Mid   Full                                                        +---------+---------------+---------+-----------+----------+--------------+ FV DistalFull                                                        +---------+---------------+---------+-----------+----------+--------------+ PFV      Full                                                        +---------+---------------+---------+-----------+----------+--------------+ POP      Full           Yes      Yes                                 +---------+---------------+---------+-----------+----------+--------------+ PTV      Full                                                        +---------+---------------+---------+-----------+----------+--------------+ PERO     Full                                                        +---------+---------------+---------+-----------+----------+--------------+     Summary: RIGHT: - There is no evidence of deep vein thrombosis in the lower extremity.  - No cystic structure found in the popliteal fossa.  LEFT: - There is no evidence of deep vein thrombosis in the lower extremity.  - No cystic structure found in the popliteal fossa.  *See table(s) above for measurements and observations. Electronically signed by Penne Colorado MD on 06/25/2024 at 11:42:43 AM.    Final    IR Angiogram Visceral Selective Result Date: 06/24/2024 INDICATION: GI bleed.  Question small bowel extravasation on CTA. EXAM: Title: MESENTERIC ARTERIOGRAM Procedures: 1. ULTRASOUND-GUIDED RIGHT COMMON FEMORAL ARTERIAL ACCESS 2. MESENTERIC ARTERIOGRAPHY, including SMA and SMA BRANCH SELECTIVE ANGIOGRAPHY COMPARISON:  CTA AP, 06/23/2024 and 06/03/2024. MEDICATIONS: None ANESTHESIA/SEDATION: Local anesthetic and single agent sedation was employed during this procedure. A total of fentanyl  100 mcg was administered intravenously. The  patient's level of consciousness and vital signs were monitored continuously by radiology nursing throughout the procedure under my direct supervision. CONTRAST:  50 mL  Omnipaque  300 FLUOROSCOPY: Radiation Exposure Index and estimated peak skin dose (PSD); Reference air kerma (RAK), 112 mGy. COMPLICATIONS: None immediate. PROCEDURE: Informed consent was obtained from the patient and/or patient's representative following explanation of the procedure, risks, benefits and alternatives. All questions were addressed. A time out was performed prior to the initiation of the procedure. Maximal barrier sterile technique utilized including caps, mask, sterile gowns, sterile gloves, large sterile drape, hand hygiene, and chlorhexidine  prep. The RIGHT femoral head was marked fluoroscopically. Ultrasound-guided access of the RIGHT femoral artery was obtained, allowing placement of a 5 Fr, 35 cm Brite tip vascular sheath. An ultrasound image was saved to PACS. A limited arteriogram was performed through the side arm of the sheath confirming appropriate access within the RIGHT common femoral artery. Over a Bentson wire, a Mickelson catheter was advanced the caudal aspect of the thoracic aorta where was reformed, back bled and flushed. The catheter was then utilized to select the superior mesenteric artery and selective arteriogram was performed. Distal SMA branch access was obtained with a 2.4 Fr Progreat alpha microcatheter and 0.016 inch Fathom microwire. Super selective arteriograms were performed. Images were reviewed and the procedure was terminated. All wires and catheters were removed from the patient. Hemostasis was achieved at the RIGHT groin access site with Angio-Seal closure device. The patient tolerated the procedure well without immediate post procedural complication. FINDINGS: *Access via the RIGHT femoral artery. *No active extravasation of contrast on mesenteric arteriography, either from the SMA or distal SMA  branch on selective or super selective angiography. IMPRESSION: 1. Mesenteric arteriography, including SMA and distal SMA branch super selective interrogation. 2. No active contrast extravasation on catheter angiography. No embolization was performed. Thom Hall, MD [facility] Electronically Signed   By: Thom Hall M.D.   On: 06/24/2024 14:27   IR US  Guide Vasc Access Right Result Date: 06/24/2024 INDICATION: GI bleed.  Question small bowel extravasation on CTA. EXAM: Title: MESENTERIC ARTERIOGRAM Procedures: 1. ULTRASOUND-GUIDED RIGHT COMMON FEMORAL ARTERIAL ACCESS 2. MESENTERIC ARTERIOGRAPHY, including SMA and SMA BRANCH SELECTIVE ANGIOGRAPHY COMPARISON:  CTA AP, 06/23/2024 and 06/03/2024. MEDICATIONS: None ANESTHESIA/SEDATION: Local anesthetic and single agent sedation was employed during this procedure. A total of fentanyl  100 mcg was administered intravenously. The patient's level of consciousness and vital signs were monitored continuously by radiology nursing throughout the procedure under my direct supervision. CONTRAST:  50 mL Omnipaque  300 FLUOROSCOPY: Radiation Exposure Index and estimated peak skin dose (PSD); Reference air kerma (RAK), 112 mGy. COMPLICATIONS: None immediate. PROCEDURE: Informed consent was obtained from the patient and/or patient's representative following explanation of the procedure, risks, benefits and alternatives. All questions were addressed. A time out was performed prior to the initiation of the procedure. Maximal barrier sterile technique utilized including caps, mask, sterile gowns, sterile gloves, large sterile drape, hand hygiene, and chlorhexidine  prep. The RIGHT femoral head was marked fluoroscopically. Ultrasound-guided access of the RIGHT femoral artery was obtained, allowing placement of a 5 Fr, 35 cm Brite tip vascular sheath. An ultrasound image was saved to PACS. A limited arteriogram was performed  through the side arm of the sheath confirming appropriate access within the RIGHT common femoral artery. Over a Bentson wire, a Mickelson catheter was advanced the caudal aspect of the thoracic aorta where was reformed, back bled and flushed. The catheter was then utilized to select the superior mesenteric artery and selective arteriogram was performed. Distal SMA branch access was obtained with a 2.4 Fr Progreat alpha microcatheter and  0.016 inch Fathom microwire. Super selective arteriograms were performed. Images were reviewed and the procedure was terminated. All wires and catheters were removed from the patient. Hemostasis was achieved at the RIGHT groin access site with Angio-Seal closure device. The patient tolerated the procedure well without immediate post procedural complication. FINDINGS: *Access via the RIGHT femoral artery. *No active extravasation of contrast on mesenteric arteriography, either from the SMA or distal SMA branch on selective or super selective angiography. IMPRESSION: 1. Mesenteric arteriography, including SMA and distal SMA branch super selective interrogation. 2. No active contrast extravasation on catheter angiography. No embolization was performed. Thom Hall, MD [facility] Electronically Signed   By: Thom Hall M.D.   On: 06/24/2024 14:27   IR Angiogram Selective Each Additional Vessel Result Date: 06/24/2024 INDICATION: GI bleed.  Question small bowel extravasation on CTA. EXAM: Title: MESENTERIC ARTERIOGRAM Procedures: 1. ULTRASOUND-GUIDED RIGHT COMMON FEMORAL ARTERIAL ACCESS 2. MESENTERIC ARTERIOGRAPHY, including SMA and SMA BRANCH SELECTIVE ANGIOGRAPHY COMPARISON:  CTA AP, 06/23/2024 and 06/03/2024. MEDICATIONS: None ANESTHESIA/SEDATION: Local anesthetic and single agent sedation was employed during this procedure. A total of fentanyl  100 mcg was administered intravenously. The patient's level of consciousness  and vital signs were monitored continuously by radiology nursing throughout the procedure under my direct supervision. CONTRAST:  50 mL Omnipaque  300 FLUOROSCOPY: Radiation Exposure Index and estimated peak skin dose (PSD); Reference air kerma (RAK), 112 mGy. COMPLICATIONS: None immediate. PROCEDURE: Informed consent was obtained from the patient and/or patient's representative following explanation of the procedure, risks, benefits and alternatives. All questions were addressed. A time out was performed prior to the initiation of the procedure. Maximal barrier sterile technique utilized including caps, mask, sterile gowns, sterile gloves, large sterile drape, hand hygiene, and chlorhexidine  prep. The RIGHT femoral head was marked fluoroscopically. Ultrasound-guided access of the RIGHT femoral artery was obtained, allowing placement of a 5 Fr, 35 cm Brite tip vascular sheath. An ultrasound image was saved to PACS. A limited arteriogram was performed through the side arm of the sheath confirming appropriate access within the RIGHT common femoral artery. Over a Bentson wire, a Mickelson catheter was advanced the caudal aspect of the thoracic aorta where was reformed, back bled and flushed. The catheter was then utilized to select the superior mesenteric artery and selective arteriogram was performed. Distal SMA branch access was obtained with a 2.4 Fr Progreat alpha microcatheter and 0.016 inch Fathom microwire. Super selective arteriograms were performed. Images were reviewed and the procedure was terminated. All wires and catheters were removed from the patient. Hemostasis was achieved at the RIGHT groin access site with Angio-Seal closure device. The patient tolerated the procedure well without immediate post procedural complication. FINDINGS: *Access via the RIGHT femoral artery. *No active extravasation of contrast on mesenteric arteriography, either from the SMA or distal SMA branch on selective or super  selective angiography. IMPRESSION: 1. Mesenteric arteriography, including SMA and distal SMA branch super selective interrogation. 2. No active contrast extravasation on catheter angiography. No embolization was performed. Thom Hall, MD [facility] Radiology Electronically Signed   By: Thom Hall M.D.   On: 06/24/2024 14:27   IR Angiogram Selective Each Additional Vessel Result Date: 06/24/2024 INDICATION: GI bleed.  Question small bowel extravasation on CTA. EXAM: Title: MESENTERIC ARTERIOGRAM Procedures: 1. ULTRASOUND-GUIDED RIGHT COMMON FEMORAL ARTERIAL ACCESS 2. MESENTERIC ARTERIOGRAPHY, including SMA and SMA BRANCH SELECTIVE ANGIOGRAPHY COMPARISON:  CTA AP, 06/23/2024 and 06/03/2024. MEDICATIONS: None ANESTHESIA/SEDATION: Local anesthetic and single agent  sedation was employed during this procedure. A total of fentanyl  100 mcg was administered intravenously. The patient's level of consciousness and vital signs were monitored continuously by radiology nursing throughout the procedure under my direct supervision. CONTRAST:  50 mL Omnipaque  300 FLUOROSCOPY: Radiation Exposure Index and estimated peak skin dose (PSD); Reference air kerma (RAK), 112 mGy. COMPLICATIONS: None immediate. PROCEDURE: Informed consent was obtained from the patient and/or patient's representative following explanation of the procedure, risks, benefits and alternatives. All questions were addressed. A time out was performed prior to the initiation of the procedure. Maximal barrier sterile technique utilized including caps, mask, sterile gowns, sterile gloves, large sterile drape, hand hygiene, and chlorhexidine  prep. The RIGHT femoral head was marked fluoroscopically. Ultrasound-guided access of the RIGHT femoral artery was obtained, allowing placement of a 5 Fr, 35 cm Brite tip vascular sheath. An ultrasound image was saved to PACS. A limited arteriogram was performed through the side  arm of the sheath confirming appropriate access within the RIGHT common femoral artery. Over a Bentson wire, a Mickelson catheter was advanced the caudal aspect of the thoracic aorta where was reformed, back bled and flushed. The catheter was then utilized to select the superior mesenteric artery and selective arteriogram was performed. Distal SMA branch access was obtained with a 2.4 Fr Progreat alpha microcatheter and 0.016 inch Fathom microwire. Super selective arteriograms were performed. Images were reviewed and the procedure was terminated. All wires and catheters were removed from the patient. Hemostasis was achieved at the RIGHT groin access site with Angio-Seal closure device. The patient tolerated the procedure well without immediate post procedural complication. FINDINGS: *Access via the RIGHT femoral artery. *No active extravasation of contrast on mesenteric arteriography, either from the SMA or distal SMA branch on selective or super selective angiography. IMPRESSION: 1. Mesenteric arteriography, including SMA and distal SMA branch super selective interrogation. 2. No active contrast extravasation on catheter angiography. No embolization was performed. Thom Hall, MD [facility] Electronically Signed   By: Thom Hall M.D.   On: 06/24/2024 14:27   CT Angio Abd/Pel W and/or Wo Contrast Result Date: 06/23/2024 CLINICAL DATA:  Lower gastrointestinal bleeding. EXAM: CTA ABDOMEN AND PELVIS WITHOUT AND WITH CONTRAST TECHNIQUE: Multidetector CT imaging of the abdomen and pelvis was performed using the standard protocol during bolus administration of intravenous contrast. Multiplanar reconstructed images and MIPs were obtained and reviewed to evaluate the vascular anatomy. RADIATION DOSE REDUCTION: This exam was performed according to the departmental dose-optimization program which includes automated exposure control, adjustment of the mA and/or  kV according to patient size and/or use of iterative reconstruction technique. CONTRAST:  80mL OMNIPAQUE  IOHEXOL  350 MG/ML SOLN COMPARISON:  January 15, 2020. FINDINGS: VASCULAR Aorta: Atherosclerosis of abdominal aorta without aneurysm or dissection. Celiac: Patent without evidence of aneurysm, dissection, vasculitis or significant stenosis. SMA: Patent without evidence of aneurysm, dissection, vasculitis or significant stenosis. Renals: Both renal arteries are patent without evidence of aneurysm, dissection, vasculitis, fibromuscular dysplasia or significant stenosis. IMA: Not definitively visualized. Inflow: Patent without evidence of aneurysm, dissection, vasculitis or significant stenosis. Proximal Outflow: Bilateral common femoral and visualized portions of the superficial and profunda femoral arteries are patent without evidence of aneurysm, dissection, vasculitis or significant stenosis. Veins: No obvious venous abnormality within the limitations of this arterial phase study. Review of the MIP images confirms the above findings. NON-VASCULAR Lower chest: No acute abnormality. Hepatobiliary: No focal liver abnormality is seen. No gallstones, gallbladder wall thickening, or biliary dilatation. Pancreas:  Unremarkable. No pancreatic ductal dilatation or surrounding inflammatory changes. Spleen: Normal in size without focal abnormality. Adrenals/Urinary Tract: Adrenal glands appear normal. Right renal cyst is noted. No hydronephrosis or renal obstruction is noted. Urinary bladder is unremarkable. Stomach/Bowel: The stomach is unremarkable. Status post total colectomy. Ileostomy is noted in right lower quadrant. There is no evidence of bowel obstruction or inflammation. Dilated segment of small bowel is seen in area of surgical anastomosis. There does appear to be accumulation of high density material in this area on later images suggesting possible small focus of gastrointestinal bleeding. This is best seen on image  number 58 of series 18. Lymphatic: No adenopathy is noted. Reproductive: Status post hysterectomy. No adnexal masses. Other: Small infraumbilical ventral hernia is noted which contains a loop of small bowel, but does not result in obstruction. No ascites. Musculoskeletal: No acute or significant osseous findings. IMPRESSION: VASCULAR Probable accumulation of high density material seen in dilated segment of small bowel in pelvis near surgical anastomosis. This is concerning for possible focus of gastrointestinal bleeding. Critical Value/emergent results were called by telephone at the time of interpretation on 06/23/2024 at 12:57 pm to provider [facility] , who verbally acknowledged these results. Aortic Atherosclerosis (ICD10-I70.0). 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 Small infraumbilical ventral hernia is noted. Electronically Signed   By: Lynwood Landy Raddle M.D.   On: 06/23/2024 12:57   I have read the above note and personally examined the patient. I agree with the assessment and plan as noted above.  Briefly Ms. Latasha Rilling is a 68 year old female with medical history significant for prior VTE's who presented with concerns for possible GI bleed.  She is currently undergoing evaluation with GI and has undergone endoscopy and capsule endoscopy which is currently underway.  No clear source of bleeding has yet been identified.  Hemoglobin did drop down to 9.6 today.  At this time recommend continuing to hold her anticoagulation therapy until a possible source has been identified.  Options moving forward would include filter placement if no clear source of bleeding can be identified or restarting anticoagulation therapy if a clear source can be identified and controlled.  Unfortunately patient did have a CT PE study which showed new pulmonary emboli.  Given the risk of bleeding I would recommend holding on treatment of these new emboli for now.  Hematology service  will continue to follow closely.   Norleen IVAR Kidney, MD Department of Hematology/Oncology [facility] Phone: [phone] Pager: [phone] Email: [email or web address]

## 2024-06-26 NOTE — Progress Notes (Signed)
 Pt was three laps around the unit. Pt told me she got a little sob at the end of her walk,

## 2024-06-26 NOTE — Progress Notes (Signed)
 PT Cancellation Note  Patient Details Name: Latasha Wood MRN: [id number] DOB: [birth year]   Cancelled Treatment:    Reason Eval/Treat Not Completed: Patient at procedure or test/unavailable [name] PT Acute Rehabilitation Services Office [phone]   [name] 06/26/2024, 3:25 PM

## 2024-06-26 NOTE — Plan of Care (Signed)

## 2024-06-26 NOTE — Progress Notes (Signed)
 [facility] ADULT ICU REPLACEMENT PROTOCOL   The patient does apply for the [facility] Adult ICU Electrolyte Replacment Protocol based on the criteria listed below:   1.Exclusion criteria: TCTS, ECMO, Dialysis, and Myasthenia Gravis patients 2. Is GFR >/= 30 ml/min? Yes.    Patient's GFR today is >60 3. Is SCr </= 2? Yes.   Patient's SCr is 0.93 mg/dL 4. Did SCr increase >/= 0.5 in 24 hours? No. 5.Pt's weight >40kg  Yes.   6. Abnormal electrolyte(s): K, Mag  7. Electrolytes replaced per protocol 8.  Call MD STAT for K+ </= 2.5, Phos </= 1, or Mag </= 1 Physician:  Jude Hunter BRAVO [facility] 06/26/2024 5:54 AM

## 2024-06-27 DIAGNOSIS — K922 Gastrointestinal hemorrhage, unspecified: Secondary | ICD-10-CM | POA: Diagnosis not present

## 2024-06-27 DIAGNOSIS — I2699 Other pulmonary embolism without acute cor pulmonale: Secondary | ICD-10-CM | POA: Diagnosis not present

## 2024-06-27 DIAGNOSIS — D509 Iron deficiency anemia, unspecified: Secondary | ICD-10-CM | POA: Diagnosis not present

## 2024-06-27 DIAGNOSIS — T183XXA Foreign body in small intestine, initial encounter: Secondary | ICD-10-CM | POA: Diagnosis not present

## 2024-06-27 DIAGNOSIS — I824Y9 Acute embolism and thrombosis of unspecified deep veins of unspecified proximal lower extremity: Secondary | ICD-10-CM

## 2024-06-27 LAB — CBC WITH DIFFERENTIAL/PLATELET
Abs Immature Granulocytes: 0.03 K/uL (ref 0.00–0.07)
Basophils Absolute: 0 K/uL (ref 0.0–0.1)
Basophils Relative: 1 %
Eosinophils Absolute: 0.3 K/uL (ref 0.0–0.5)
Eosinophils Relative: 3 %
HCT: 30 % — ABNORMAL LOW (ref 36.0–46.0)
Hemoglobin: 9.5 g/dL — ABNORMAL LOW (ref 12.0–15.0)
Immature Granulocytes: 0 %
Lymphocytes Relative: 23 %
Lymphs Abs: 1.9 K/uL (ref 0.7–4.0)
MCH: 26.8 pg (ref 26.0–34.0)
MCHC: 31.7 g/dL (ref 30.0–36.0)
MCV: 84.7 fL (ref 80.0–100.0)
Monocytes Absolute: 0.6 K/uL (ref 0.1–1.0)
Monocytes Relative: 7 %
Neutro Abs: 5.4 K/uL (ref 1.7–7.7)
Neutrophils Relative %: 66 %
Platelets: 242 K/uL (ref 150–400)
RBC: 3.54 MIL/uL — ABNORMAL LOW (ref 3.87–5.11)
RDW: 15.1 % (ref 11.5–15.5)
WBC: 8.2 K/uL (ref 4.0–10.5)
nRBC: 0 % (ref 0.0–0.2)

## 2024-06-27 LAB — BASIC METABOLIC PANEL WITH GFR
Anion gap: 7 (ref 5–15)
BUN: 16 mg/dL (ref 8–23)
CO2: 24 mmol/L (ref 22–32)
Calcium: 8.5 mg/dL — ABNORMAL LOW (ref 8.9–10.3)
Chloride: 107 mmol/L (ref 98–111)
Creatinine, Ser: 1.05 mg/dL — ABNORMAL HIGH (ref 0.44–1.00)
GFR, Estimated: 58 mL/min — ABNORMAL LOW (ref >60)
Glucose, Bld: 91 mg/dL (ref 70–99)
Potassium: 3.6 mmol/L (ref 3.5–5.1)
Sodium: 138 mmol/L (ref 135–145)

## 2024-06-27 MED ORDER — BUTALBITAL-APAP-CAFFEINE 50-325-40 MG PO TABS
1.0000 | ORAL_TABLET | Freq: Four times a day (QID) | ORAL | Status: DC | PRN
  Administered 2024-06-27 – 2024-06-29 (×2): 1 via ORAL
  Filled 2024-06-27 (×2): qty 1

## 2024-06-27 NOTE — Consult Note (Signed)
 Consult Note  Latasha Wood 1956/08/27  [id number].    Requesting MD: Aloha Finner, MD Chief Complaint/Reason for Consult: GI bleeding and possible retained endoscopy capsule HPI:  Patient is a 68 year old female who is followed by [facility] for Hx of ulcerative pancolitis since age 38 who failed multiple medical therapies and ultimately underwent elective robotic assisted proctocolectomy, 90 min LOA and small bowel resection (due to an area of chronic stricturing) in November of 2016 by Dr. Debby. 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 She developed ECF after her second surgery and required TPN. ECF resolved and she was taken back to the OR October of 2017 to revise ileostomy back to RLQ, during this surgery she also underwent 60 min LOA. Prior to all of this she had SBO both in 2005 and 2012 which were treated non-operatively. She had recurrent SBO in March 2021 which was also managed non-operatively. Other prior abdominal surgeries include exploratory laparotomy with D&C in 2002 and hysterectomy in 2005.  Patient had recent admission 6/12-6/15 for DVTs and PE and was started on Eliquis . She then was admitted 6/24-6/27 with anemia and melena, underwent EGD and CTA and no source of bleeding was identified. Bleeding appeared to have stopped and she resumed Eliquis . She followed up with Dr. Leigh in the office 7/30 and reported some bright red blood around ostomy when she changes it at times. She was unsure at that time if bleeding was coming from around the ostomy vs distal small bowel, had not seen [facility] RN and did not have ostomy supplies with her to be able to take down appliance in the office at that time. During late June's inpatient stay, capsule endoscopy was discussed but there was some concern for history of SBO and concern for ventral  hernia. At that time ileoscopy was planned and referral made to ostomy clinic. Ileoscopy done 8/14 and examined ileum appeared normal with no active bleeding sources noted. 8/15 patient notified GI office that she had a large amount of blood in ileostomy pouch 8/14 around 9 PM with several episodes after that. She went to the ED for evaluation. Her last dose of Eliquis  was 8/14 around 9:30 PM. CTA on 8/15 showed probable accumulation of high density material in segment of small bowel and pelvis ~40 cm from ileostomy near surgical anastomosis with concern for GI bleeding. Eliquis  was reversed with KCentra  and patient was transferred to ICU for syncopal episode on transfer from ED to floor. She was transfused 2 PRBC 8/15, hgb has been relatively stable at 9 since 8/16. IR was consulted and patient underwent arteriography on 8/15, no embolization was perforated since there was no evidence of active extravasation. Patient continued to have dark stools and ultimately underwent repeat ileoscopy on 8/17 by Dr. Stacia and using ultraslim scope was only able to examine distal 30 cm, no source of bleeding identified. Patient given patency capsule which was ingested on 8/17 with plans for capsule endoscopy if able to pass patency capsule, but it was not so capsule endo is not being pursued.  This capsule dissolved intra-luminally. She had another CT PE yesterday that also showed new clots in lungs since holding anticoagulation. Decision made to pursue IVC filter placement.  General surgery has been asked to evaluate in setting of concern for possible ongoing GI bleeding that GI has not  been able to reach thus far with scope and need for ongoing anticoagulation. Etiology of DVT/PE is still not apparent.   Husband on Facetime while in [place], [place]. ROS: ROS: see HPI, otherwise she still complains of some shortness of breath.  She did have chest pain during her active hemorrhage.  This has resolved.  Family History   Problem Relation Age of Onset   Diabetes Mother    Breast cancer Mother 37   Diabetes Father    Heart disease Father    HIV Father    Diabetes Sister    Breast cancer Sister 51   Diverticulosis Sister    Throat cancer Sister    Diabetes Sister    Irritable bowel syndrome Sister    Diabetes Brother    Diverticulosis Brother    Diabetes Maternal Grandmother    Hyperlipidemia Daughter    Breast cancer Maternal Aunt    Colon cancer Paternal Aunt     Past Medical History:  Diagnosis Date   Anemia    Anxiety    Arthritis    Blood transfusion    Bowel obstruction (HCC)    Colitis, ulcerative (HCC)    Colon polyps    DVT (deep venous thrombosis) (HCC)    Hyperlipidemia    Hypertension    Ileostomy present (HCC)    Kidney disease    Numbness and tingling    hands and feet bilat    Pulmonary embolism (HCC)    Shortness of breath dyspnea    talking or walking    Type 2 diabetes mellitus with complication, without long-term current use of insulin  (HCC) 09/05/2015   currently on no medications, (05/26/2016) pt denies diabetes.  States that she had one time high blood sugars d/t prednisone     Past Surgical History:  Procedure Laterality Date   ABDOMINAL HYSTERECTOMY  2000   ABDOMINAL SURGERY     BREAST CYST ASPIRATION     CATARACT EXTRACTION W/PHACO Right 10/20/2017   Procedure: CATARACT EXTRACTION PHACO AND INTRAOCULAR LENS PLACEMENT (IOC);  Surgeon: Myrna Adine Anes, MD;  Location: [facility];  Service: Ophthalmology;  Laterality: Right;  US  00:50.1 AP% 13.2 CDE 6.61 Fluid Pack lot # [id number]   COLON SURGERY     colostomy   cortisol shot     in left shoulder   DILATION AND CURETTAGE OF UTERUS     most likely after miscarriage   ESOPHAGOGASTRODUODENOSCOPY N/A 05/03/2024   Procedure: EGD (ESOPHAGOGASTRODUODENOSCOPY);  Surgeon: Charlanne Groom, MD;  Location: [facility];  Service: Gastroenterology;  Laterality: N/A;   fibrocystic breast disease     q 6 month mammogram    gravida 6 para 2     all SAB   hyadiform mole     ILEO LOOP DIVERSION N/A 09/25/2015   Procedure: ILEO LOOP COLOSTOMY;  Surgeon: Bernarda Ned, MD;  Location: [facility];  Service: General;  Laterality: N/A;   ILEOSTOMY CLOSURE N/A 10/08/2015   Procedure: ILEOSTOMY REVISION;  Surgeon: Bernarda Ned, MD;  Location: [facility];  Service: General;  Laterality: N/A;   ILEOSTOMY CLOSURE N/A 08/20/2016   Procedure: OPEN RELOCATION OF ILEOSTOMY;  Surgeon: Bernarda Ned, MD;  Location: [facility];  Service: General;  Laterality: N/A;   IR ANGIOGRAM SELECTIVE EACH ADDITIONAL VESSEL  06/23/2024   IR ANGIOGRAM SELECTIVE EACH ADDITIONAL VESSEL  06/23/2024   IR ANGIOGRAM VISCERAL SELECTIVE  06/23/2024   IR GENERIC HISTORICAL  09/01/2016   IR US  GUIDE VASC ACCESS RIGHT 09/01/2016 Franky Rusk, PA-C [facility]  IR GENERIC HISTORICAL  09/01/2016   IR FLUORO GUIDE CV LINE RIGHT 09/01/2016 Franky Rusk, PA-C [facility]   IR US  GUIDE VASC ACCESS RIGHT  06/23/2024   KNEE ARTHROSCOPY W/ MENISCAL REPAIR     right knee (Daldorf)   LAPAROSCOPIC SMALL BOWEL RESECTION N/A 09/25/2015   Procedure: LAPAROSCOPIC BOWEL RESECTION TIMES TWO;  Surgeon: Bernarda Ned, MD;  Location: [facility];  Service: General;  Laterality: N/A;   ROBOTIC ASSISTED LAPAROSCOPIC LYSIS OF ADHESION N/A 09/25/2015   Procedure: XI ROBOTIC ASSISTED LAPAROSCOPIC LYSIS OF ADHESION;  Surgeon: Bernarda Ned, MD;  Location: [facility];  Service: General;  Laterality: N/A;  90 minutes   Small bowel obstruction      Social History:  reports that she has never smoked. She has never been exposed to tobacco smoke. She has never used smokeless tobacco. She reports current alcohol use of about 2.0 standard drinks of alcohol per week. She reports that she does not use drugs.  Allergies:  Allergies  Allergen Reactions   Betadine  [Povidone Iodine ] Rash and Other (See Comments)    The skin is very sensitive   Latex Rash and Other (See Comments)    Redness of skin    Tape Rash and Other (See Comments)    Redness of skin OK with hypofix tape and paper tape! NO transpore tape adhesive tape!    Medications Prior to Admission  Medication Sig Dispense Refill   ALPRAZolam  (XANAX ) 0.25 MG tablet Take 1 tablet (0.25 mg total) by mouth 2 (two) times daily as needed for anxiety. 180 tablet 0   apixaban  (ELIQUIS ) 5 MG TABS tablet Take 1 tablet (5 mg total) by mouth 2 (two) times daily. 360 tablet 0   augmented betamethasone dipropionate (DIPROLENE-AF) 0.05 % cream Apply 1 Application topically 2 (two) times daily as needed (Dermatitis).     cholecalciferol (VITAMIN D3) 25 MCG (1000 UNIT) tablet Take 1,000 Units by mouth daily.     hydrocortisone  2.5 % cream APPLY TOPICALLY TWICE DAILY (Patient taking differently: Apply 1 Application topically 2 (two) times daily as needed (Dermatitis).) 120 g 0   indapamide  (LOZOL ) 1.25 MG tablet TAKE 1 TABLET BY MOUTH DAILY 100 tablet 2   losartan  (COZAAR ) 50 MG tablet Take 1 tablet (50 mg total) by mouth daily. 90 tablet 1   Multiple Vitamin (MULTIVITAMIN WITH MINERALS) TABS tablet Take 1 tablet by mouth daily with breakfast.     nystatin  cream (MYCOSTATIN ) Apply 1 Application topically 2 (two) times daily. (Patient taking differently: Apply 1 Application topically 2 (two) times daily as needed for dry skin.) 60 g 1   pantoprazole  (PROTONIX ) 40 MG tablet Take 1 tablet (40 mg total) by mouth daily. 90 tablet 3   potassium chloride  (KLOR-CON ) 10 MEQ tablet TAKE 1 TABLET BY MOUTH 3 TIMES  DAILY 270 tablet 0    Blood pressure 138/80, pulse 70, temperature 98.7 F (37.1 C), temperature source Oral, resp. rate 19, height 5' 3 (1.6 m), weight 76.7 kg, SpO2 100%. Physical Exam:  General: pleasant, WD, WN black female who is laying in bed in NAD HEENT: head is normocephalic, atraumatic.  Sclera are noninjected.  PERRL.  Ears and nose without any masses or lesions.  Mouth is pink and moist Heart: regular, rate, and rhythm.  Normal s1,s2.  No obvious murmurs, gallops, or rubs noted.   Lungs: CTAB, no wheezes, rhonchi, or rales noted.  Respiratory effort nonlabored Abd: soft, NT, ND, +BS, ileostomy in place in RLQ, stoma is viable  and some output in pouch with no blood or melena present.  Psych: A&Ox3 with an appropriate affect.   Results for orders placed or performed during the hospital encounter of 06/23/24 (from the past 48 hours)  CBC with Differential/Platelet     Status: Abnormal   Collection Time: 06/26/24  3:11 AM  Result Value Ref Range   WBC 9.6 4.0 - 10.5 K/uL   RBC 3.51 (L) 3.87 - 5.11 MIL/uL   Hemoglobin 9.6 (L) 12.0 - 15.0 g/dL   HCT 70.2 (L) 63.9 - 53.9 %   MCV 84.6 80.0 - 100.0 fL   MCH 27.4 26.0 - 34.0 pg   MCHC 32.3 30.0 - 36.0 g/dL   RDW 84.8 88.4 - 84.4 %   Platelets 222 150 - 400 K/uL   nRBC 0.0 0.0 - 0.2 %   Neutrophils Relative % 71 %   Neutro Abs 6.7 1.7 - 7.7 K/uL   Lymphocytes Relative 19 %   Lymphs Abs 1.9 0.7 - 4.0 K/uL   Monocytes Relative 7 %   Monocytes Absolute 0.7 0.1 - 1.0 K/uL   Eosinophils Relative 3 %   Eosinophils Absolute 0.3 0.0 - 0.5 K/uL   Basophils Relative 0 %   Basophils Absolute 0.0 0.0 - 0.1 K/uL   Immature Granulocytes 0 %   Abs Immature Granulocytes 0.02 0.00 - 0.07 K/uL    Comment: Performed at [facility], [address] [place], [place] [address]  Basic metabolic panel     Status: Abnormal   Collection Time: 06/26/24  3:11 AM  Result Value Ref Range   Sodium 136 135 - 145 mmol/L   Potassium 3.2 (L) 3.5 - 5.1 mmol/L   Chloride 106 98 - 111 mmol/L   CO2 23 22 - 32 mmol/L   Glucose, Bld 121 (H) 70 - 99 mg/dL    Comment: Glucose reference range applies only to samples taken after fasting for at least 8 hours.   BUN 21 8 - 23 mg/dL   Creatinine, Ser 9.06 0.44 - 1.00 mg/dL   Calcium  8.3 (L) 8.9 - 10.3 mg/dL   GFR, Estimated >39 >39 mL/min    Comment: (NOTE) Calculated using the CKD-EPI Creatinine Equation (2021)    Anion gap 7 5 - 15     Comment: Performed at [facility], [address], [place], [place] [address]  Magnesium      Status: None   Collection Time: 06/26/24  3:11 AM  Result Value Ref Range   Magnesium  1.7 1.7 - 2.4 mg/dL    Comment: Performed at [facility], [address] [place], [place] [address]  Phosphorus     Status: None   Collection Time: 06/26/24  3:11 AM  Result Value Ref Range   Phosphorus 3.2 2.5 - 4.6 mg/dL    Comment: Performed at [facility], [address], [place], [place] [address]  CBC with Differential/Platelet     Status: Abnormal   Collection Time: 06/27/24  4:40 AM  Result Value Ref Range   WBC 8.2 4.0 - 10.5 K/uL   RBC 3.54 (L) 3.87 - 5.11 MIL/uL   Hemoglobin 9.5 (L) 12.0 - 15.0 g/dL   HCT 69.9 (L) 63.9 - 53.9 %   MCV 84.7 80.0 - 100.0 fL   MCH 26.8 26.0 - 34.0 pg   MCHC 31.7 30.0 - 36.0 g/dL   RDW 84.8 88.4 - 84.4 %   Platelets 242 150 - 400 K/uL  nRBC 0.0 0.0 - 0.2 %   Neutrophils Relative % 66 %   Neutro Abs 5.4 1.7 - 7.7 K/uL   Lymphocytes Relative 23 %   Lymphs Abs 1.9 0.7 - 4.0 K/uL   Monocytes Relative 7 %   Monocytes Absolute 0.6 0.1 - 1.0 K/uL   Eosinophils Relative 3 %   Eosinophils Absolute 0.3 0.0 - 0.5 K/uL   Basophils Relative 1 %   Basophils Absolute 0.0 0.0 - 0.1 K/uL   Immature Granulocytes 0 %   Abs Immature Granulocytes 0.03 0.00 - 0.07 K/uL    Comment: Performed at [facility], [address], [place], [place] [address]  Basic metabolic panel with GFR     Status: Abnormal   Collection Time: 06/27/24  4:40 AM  Result Value Ref Range   Sodium 138 135 - 145 mmol/L   Potassium 3.6 3.5 - 5.1 mmol/L   Chloride 107 98 - 111 mmol/L   CO2 24 22 - 32 mmol/L   Glucose, Bld 91 70 - 99 mg/dL    Comment: Glucose reference range applies only to samples taken after fasting for at least 8 hours.   BUN 16 8 - 23 mg/dL   Creatinine, Ser 8.94 (H) 0.44 - 1.00 mg/dL   Calcium  8.5 (L) 8.9  - 10.3 mg/dL   GFR, Estimated 58 (L) >60 mL/min    Comment: (NOTE) Calculated using the CKD-EPI Creatinine Equation (2021)    Anion gap 7 5 - 15    Comment: Performed at [facility], [address] [place], [place] [address]   ECHOCARDIOGRAM COMPLETE Result Date: 06/26/2024    ECHOCARDIOGRAM REPORT   Patient Name:   LYZBETH GENRICH Date of Exam: 06/26/2024 Medical Rec #:  [id number]           Height:       63.0 in Accession #:    [id number]          Weight:       169.1 lb Date of Birth:  July 18, 1956            BSA:          1.800 m Patient Age:    68 years            BP:           138/79 mmHg Patient Gender: F                   HR:           63 bpm. Exam Location:  Inpatient Procedure: 2D Echo, Cardiac Doppler and Color Doppler (Both Spectral and Color            Flow Doppler were utilized during procedure). Indications:    pulmonary embolus  History:        Patient has prior history of Echocardiogram examinations, most                 recent 04/22/2024.  Sonographer:    Therisa Crouch Referring Phys: [id number] AMRIT ADHIKARI IMPRESSIONS  1. Left ventricular ejection fraction, by estimation, is 55 to 60%. The left ventricle has normal function. The left ventricle has no regional wall motion abnormalities. There is mild concentric left ventricular hypertrophy. Left ventricular diastolic parameters are consistent with Grade I diastolic dysfunction (impaired relaxation).  2. Right ventricular systolic function is normal. The right ventricular size is normal. There is normal pulmonary artery systolic pressure. The estimated right ventricular systolic  pressure is 21.8 mmHg.  3. The mitral valve is normal in structure. Trivial mitral valve regurgitation. No evidence of mitral stenosis.  4. The aortic valve is tricuspid. Aortic valve regurgitation is not visualized. No aortic stenosis is present.  5. The inferior vena cava is normal in size with greater than 50% respiratory variability, suggesting  right atrial pressure of 3 mmHg. FINDINGS  Left Ventricle: Left ventricular ejection fraction, by estimation, is 55 to 60%. The left ventricle has normal function. The left ventricle has no regional wall motion abnormalities. The left ventricular internal cavity size was normal in size. There is  mild concentric left ventricular hypertrophy. Left ventricular diastolic parameters are consistent with Grade I diastolic dysfunction (impaired relaxation). Right Ventricle: The right ventricular size is normal. No increase in right ventricular wall thickness. Right ventricular systolic function is normal. There is normal pulmonary artery systolic pressure. The tricuspid regurgitant velocity is 2.17 m/s, and  with an assumed right atrial pressure of 3 mmHg, the estimated right ventricular systolic pressure is 21.8 mmHg. Left Atrium: Left atrial size was normal in size. Right Atrium: Right atrial size was normal in size. Pericardium: There is no evidence of pericardial effusion. Mitral Valve: The mitral valve is normal in structure. Trivial mitral valve regurgitation. No evidence of mitral valve stenosis. Tricuspid Valve: The tricuspid valve is normal in structure. Tricuspid valve regurgitation is trivial. Aortic Valve: The aortic valve is tricuspid. Aortic valve regurgitation is not visualized. No aortic stenosis is present. Pulmonic Valve: The pulmonic valve was normal in structure. Pulmonic valve regurgitation is not visualized. Aorta: The aortic root is normal in size and structure. Venous: The inferior vena cava is normal in size with greater than 50% respiratory variability, suggesting right atrial pressure of 3 mmHg. IAS/Shunts: No atrial level shunt detected by color flow Doppler.  LEFT VENTRICLE PLAX 2D LVIDd:         3.40 cm   Diastology LVIDs:         2.10 cm   LV e' medial:    10.60 cm/s LV PW:         1.00 cm   LV E/e' medial:  7.4 LV IVS:        1.10 cm   LV e' lateral:   11.90 cm/s LVOT diam:     1.90 cm   LV  E/e' lateral: 6.6 LVOT Area:     2.84 cm  RIGHT VENTRICLE             IVC RV Basal diam:  2.80 cm     IVC diam: 1.90 cm RV S prime:     11.40 cm/s TAPSE (M-mode): 2.8 cm LEFT ATRIUM             Index        RIGHT ATRIUM           Index LA diam:        2.20 cm 1.22 cm/m   RA Area:     17.40 cm LA Vol (A2C):   51.6 ml 28.66 ml/m  RA Volume:   42.70 ml  23.72 ml/m LA Vol (A4C):   43.9 ml 24.38 ml/m LA Biplane Vol: 47.8 ml 26.55 ml/m   AORTA Ao Root diam: 3.00 cm Ao Asc diam:  3.60 cm MITRAL VALVE               TRICUSPID VALVE MV Area (PHT): 3.60 cm    TR Peak grad:   18.8 mmHg MV Decel  Time: 211 msec    TR Vmax:        217.00 cm/s MV E velocity: 78.00 cm/s MV A velocity: 71.10 cm/s  SHUNTS MV E/A ratio:  1.10        Systemic Diam: 1.90 cm Dalton McleanMD Electronically signed by Ezra Kanner Signature Date/Time: 06/26/2024/9:01:14 PM    Final    DG Abd 1 View Result Date: 06/26/2024 CLINICAL DATA:  Evaluate for foreign body.  Retained capsule. EXAM: ABDOMEN - 1 VIEW COMPARISON:  Abdominal x-ray 06/26/2024. FINDINGS: Previous identified rectangular foreign body overlying the colostomy is no longer seen. Surgical anastomotic staple line is again noted in the pelvis. There is contrast in the bladder and renal collecting systems. A can give a are likely external artifact is overlying the lower right hemipelvis which appears unchanged from prior. External snap artifact overlies the lateral left abdominal wall. No new unexpected radiopaque foreign body identified. Bowel-gas pattern is nonobstructive. No suspicious calcifications are seen. IMPRESSION: Previous identified rectangular foreign body overlying the colostomy is no longer seen. No new unexpected radiopaque foreign body identified. Electronically Signed   By: Greig Pique M.D.   On: 06/26/2024 19:18   CT Angio Chest Pulmonary Embolism (PE) W or WO Contrast Result Date: 06/26/2024 CLINICAL DATA:  History of bilateral DVTs, on Eliquis  EXAM: CT  ANGIOGRAPHY CHEST WITH CONTRAST TECHNIQUE: Multidetector CT imaging of the chest was performed using the standard protocol during bolus administration of intravenous contrast. Multiplanar CT image reconstructions and MIPs were obtained to evaluate the vascular anatomy. RADIATION DOSE REDUCTION: This exam was performed according to the departmental dose-optimization program which includes automated exposure control, adjustment of the mA and/or kV according to patient size and/or use of iterative reconstruction technique. CONTRAST:  75mL OMNIPAQUE  IOHEXOL  350 MG/ML SOLN COMPARISON:  April 20, 2024 FINDINGS: Pulmonary Embolism: Resolution of the acute embolus within the interlobar pulmonary artery in the right lung. Small amount of residual distal segmental/subsegmental embolus in the basilar segments of the right lower lobe. Increasing clot burden within the left upper lobe lingular branch, extending into the interlobar pulmonary artery and into the lobar branch of the left lower lobe. The distal segmental and subsegmental emboli in the left lower lobe on the prior study have resolved. Cardiovascular: No cardiomegaly or pericardial effusion.No aortic aneurysm. Subtle reflux of contrast into the hepatic veins. Mediastinum/Nodes: No mediastinal mass.Similar heterogeneous enlargement of the right thyroid  lobe measuring 4.5 cm in AP dimension.No mediastinal, hilar, or axillary lymphadenopathy. Lungs/Pleura: The midline trachea and bronchi are patent. Posterior bibasilar dependent atelectasis. No focal airspace consolidation, pleural effusion, or pneumothorax. Musculoskeletal: No acute fracture or destructive bone lesion. Multilevel degenerative disc disease of the spine. Osteopenia. Upper Abdomen: No acute abnormality in the partially visualized upper abdomen. Review of the MIP images confirms the above findings. IMPRESSION: 1. Although there has been interval resolution of some of the pulmonary emboli on the prior study,  there are new emboli in the left upper lobe lingular branch and left interlobar pulmonary arteries. While there is subtle reflux of contrast into the hepatic veins, as can be seen in elevated right heart pressures or right heart strain, the RV to LV ratio remains normal and there are no additional findings of right heart strain. Laboratory correlation recommended. If this remains of clinical concern, a follow-up echocardiogram should be considered. 2. No pneumonia, pulmonary edema, or pleural effusion. 3. Similar appearance of the heterogeneously enlarged right thyroid  lobe. If not previously evaluated, a nonemergent thyroid  ultrasound is recommended  for further characterization. Critical Value/emergent results were called by telephone at the time of interpretation on 06/26/2024 at 1501 to provider Dr. Jillian, who verbally acknowledged these results. Electronically Signed   By: Rogelia Myers M.D.   On: 06/26/2024 15:01   DG Abd 1 View Result Date: 06/26/2024 CLINICAL DATA:  [id number] Foreign body alimentary tract, subsequent encounter [phone]. EXAM: ABDOMEN - 1 VIEW COMPARISON:  01/16/2020. FINDINGS: The bowel gas pattern is non-obstructive. Bowel anastomotic suture noted overlying the lower sacrum region. No evidence of pneumoperitoneum, within the limitations of a supine film. No acute osseous abnormalities. The soft tissues are within normal limits. Surgical changes, devices, tubes and lines: There is a rectangular 1.3 x 2.0 cm opacity overlying the right lower quadrant region superimposed on colostomy ring. It is unclear whether this is external to the patient. Correlate with physical examination. IMPRESSION: Nonobstructive bowel gas pattern. There is a rectangular 1.3 x 2.0 cm opacity overlying the right lower quadrant region superimposed on colostomy ring. It is unclear whether this is external to the patient. Correlate with physical examination. Electronically Signed   By: Ree Molt M.D.   On:  06/26/2024 11:40      Assessment/Plan Hx of UC s/p elective robotic assisted colectomy, 90 min LOA and small bowel resectio November 2016 - post-op complications ileostomy necrosis with revision, development of ECF, 2nd revision of ileostomy and SBO High output ileostomy GI bleeding s/p Eliquis  for DVT/PE diagnosed in June 2025 The patient has been seen, examined, labs, vitals, chart, and imaging personally reviewed.  This is a very complicated patient with a complex abdominal history as outlined above.  The patient has been having GI bleeding after initiation of Eliquis  in June for new onset DVTs/PEs of unclear etiology.  This is currently being held and consideration of IVC filter in the interim is being discussed.  She did have a CTA which suggests possible source of bleeding near her old anastomosis.  An attempt at embolization was done by IR this admission, but no active bleeding was noted at that time.  GI tried a new ileoscopy, but was unable to get high enough to find the site and source of her bleeding.  Currently, she is not longer bleeding.  We have been asked to weigh in currently given inability to GI to locate/get to the source.    We will review with Dr. 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  This would likely be fraught with potential postoperative complications.  We do have a general area of the bleeding site, but still would be difficult to definitively confirm area of resection would contain the site of bleeding.  At this time, we would not recommend surgical intervention unless it were an absolute last resort.  If she were to start rebleeding, we would recommend re-engagement of IR for possible repeat angio.  This was thoroughly discussed with the patient as well as her husband via Facetime.  All questions were answered to the best of our ability.  They were appreciative of our  visit.  FEN: regular diet VTE: Eliquis  on hold, likely IVC filter ID: none currently needed  History of ulcerative colitis ABL anemia HTN HLD High ileostomy output DM   I reviewed Consultant GI, IR, Critical care notes, hospitalist notes, last 24 h vitals and pain scores, last 48 h intake and output, last 24 h labs and trends, and last 24  h imaging results.   Burnard FORBES Banter, [facility] 06/27/2024, 2:39 PM Please see Amion for pager number during day hours 7:00am-4:30pm

## 2024-06-27 NOTE — Progress Notes (Addendum)
 PROGRESS NOTE  Latasha Wood  FMW:[id number] DOB: 1956/10/31 DOA: 06/23/2024 PCP: Joshua Debby CROME, MD   Brief Narrative: Patient is a 67 year old female with history of ulcerative pancolitis status post colectomy, postoperative fistulas, small bowel obstruction, high ostomy output, PE/DVT on Eliquis  who presented from home with complaint of bloody output from ileostomy.  She was admitted on June 24 for the same problem.  Report of multiple episodes of bloody output in the ileostomy bag, dizziness.  On presentation hemoglobin was 10.2.  CT abdomen/pelvis showed accumulation of high density material and dilated segment of small bowel near the surgical anastomotic site.  Suspected GI bleed.  GI consulted.  After admission, she became unresponsive and hypotensive.  Patient transferred to ICU service, given blood transfusion.  Currently hemoglobin stable, she is hemodynamically stable.  Transferred back to [facility] service on 8/18.  GI/oncology consulted and following  Assessment & Plan:  Principal Problem:   GI bleed Active Problems:   Hemorrhagic shock (HCC)   Stage 3a chronic kidney disease (HCC)   History of total colectomy   Chronic anticoagulation   History of pulmonary embolism   Abnormal computed tomography angiography (CTA) of abdomen   Acute blood loss anemia   Foreign body in small intestine  Hemorrhagic shock /syncope secondary recurrent upper GI bleed in the setting of anticoagulation: Presented with complaint of bloody output from ileostomy.  Report of multiple episodes of bloody output in the ileostomy bag, dizziness.  Became hypotensive unresponsive during this hospitalization and had to be transferred to ICU.  Given 2 units of PRBC.  Given Kcentra , tranexamic initiated.  IR was consulted for arterial embolization on 8/15 and underwent mesenteric aortography but no source was located.  GI following.  Ileoscopy did not show any bleeding.  Plan for video capsule endoscopy.  Currently  hemoglobin is stable.  Currently on PPI, continue the same.  Acute blood loss secondary to upper GI bleed: Given 2 units of transfusion during this hospitalization.  Currently hemoglobin stable in the range of 9.  Continue to monitor.Given IV iron  during this hospitalization  CKD stage IIIa: Currently kidney function at baseline.  History of DVT/PE: Was taking Eliquis  at home.  She was previously found to have DVT, acute bilateral PE without evidence of heart strain.  Currently Eliquis  on hold.  Decision to be made whether she needs to be continued on Eliquis  or IVC filter placed.  She follows with Dr Federico who is following her CT angiogram done here showed new PE on the left, no significant right heart strain, follow-up echo.  Currently anticoagulation on hold.  Hypokalemia: Currently being monitored and supplemented as needed.  History of high output from ileostomy  History of ulcerative colitis/ileostomy: Follows with GI  History of thyroid  mass: Should be follow-up as an outpatient          DVT prophylaxis:None     Code Status: Full Code  Family Communication: None at bedside  Patient status:Inpatient  Patient is from :Home  Anticipated discharge un:ynfz  Estimated DC date: After full workup   Consultants: GI, PCCM, radiology  Procedures: Mesenteric arteriography  Antimicrobials:  Anti-infectives (From admission, onward)    None       Subjective:  Patient seen and examined at bedside today.  Hemodynamically stable.  Comfortably sitting on the chair.  No shortness of breath while at rest.  On room air.  No chest pain.  Continues to have bowel movement in the ostomy which is brown in color.  Objective: Vitals:   06/26/24 1454 06/26/24 2117 06/27/24 0124 06/27/24 0441  BP: 138/79 132/83 121/75 111/67  Pulse: 69 74 68 68  Resp: (!) 24 18  18   Temp: 98 F (36.7 C) 98.9 F (37.2 C) 98.1 F (36.7 C) 98.2 F (36.8 C)  TempSrc: Oral Oral Oral Oral  SpO2:  100% 98% 98% 99%  Weight:      Height:        Intake/Output Summary (Last 24 hours) at 06/27/2024 1223 Last data filed at 06/26/2024 1454 Gross per 24 hour  Intake 360 ml  Output --  Net 360 ml   Filed Weights   06/24/24 1725  Weight: 76.7 kg    Examination:   General exam: Overall comfortable, not in distress HEENT: PERRL Respiratory system:  no wheezes or crackles  Cardiovascular system: S1 & S2 heard, RRR.  Gastrointestinal system: Abdomen is nondistended, soft and nontender.  Ileostomy with brown stool Central nervous system: Alert and oriented Extremities: No edema, no clubbing ,no cyanosis Skin: No rashes, no ulcers,no icterus      Data Reviewed: I have personally reviewed following labs and imaging studies  CBC: Recent Labs  Lab 06/23/24 1107 06/23/24 1536 06/24/24 0618 06/24/24 1826 06/25/24 0629 06/26/24 0311 06/27/24 0440  WBC 8.3  --   --  8.8 8.0 9.6 8.2  NEUTROABS  --   --   --  5.9 5.7 6.7 5.4  HGB 10.2*   < > 10.2* 9.7* 9.8* 9.6* 9.5*  HCT 31.4*   < > 35.5* 29.5* 29.8* 29.7* 30.0*  MCV 84.2  --   --  83.3 83.2 84.6 84.7  PLT 366  --   --  229 215 222 242   < > = values in this interval not displayed.   Basic Metabolic Panel: Recent Labs  Lab 06/23/24 1107 06/24/24 0618 06/25/24 0629 06/26/24 0311 06/27/24 0440  NA 138 145 138 136 138  K 3.2* 4.0 2.9* 3.2* 3.6  CL 102 107 103 106 107  CO2 26 27 25 23 24   GLUCOSE 104* 112* 95 121* 91  BUN 26* 25* 11 21 16   CREATININE 1.06* 0.39* 0.97 0.93 1.05*  CALCIUM  9.4 8.1* 8.8* 8.3* 8.5*  MG  --  1.9 1.8 1.7  --   PHOS  --   --  3.7 3.2  --      No results found for this or any previous visit (from the past 240 hours).   Radiology Studies: ECHOCARDIOGRAM COMPLETE Result Date: 06/26/2024    ECHOCARDIOGRAM REPORT   Patient Name:   Latasha Wood Date of Exam: 06/26/2024 Medical Rec #:  [id number]           Height:       63.0 in Accession #:    [id number]          Weight:       169.1 lb Date  of Birth:  1955-12-24            BSA:          1.800 m Patient Age:    68 years            BP:           138/79 mmHg Patient Gender: F                   HR:           63 bpm. Exam Location:  Inpatient Procedure: 2D Echo, Cardiac  Doppler and Color Doppler (Both Spectral and Color            Flow Doppler were utilized during procedure). Indications:    pulmonary embolus  History:        Patient has prior history of Echocardiogram examinations, most                 recent 04/22/2024.  Sonographer:    Therisa Crouch Referring Phys: [id number] Salimata Christenson IMPRESSIONS  1. Left ventricular ejection fraction, by estimation, is 55 to 60%. The left ventricle has normal function. The left ventricle has no regional wall motion abnormalities. There is mild concentric left ventricular hypertrophy. Left ventricular diastolic parameters are consistent with Grade I diastolic dysfunction (impaired relaxation).  2. Right ventricular systolic function is normal. The right ventricular size is normal. There is normal pulmonary artery systolic pressure. The estimated right ventricular systolic pressure is 21.8 mmHg.  3. The mitral valve is normal in structure. Trivial mitral valve regurgitation. No evidence of mitral stenosis.  4. The aortic valve is tricuspid. Aortic valve regurgitation is not visualized. No aortic stenosis is present.  5. The inferior vena cava is normal in size with greater than 50% respiratory variability, suggesting right atrial pressure of 3 mmHg. FINDINGS  Left Ventricle: Left ventricular ejection fraction, by estimation, is 55 to 60%. The left ventricle has normal function. The left ventricle has no regional wall motion abnormalities. The left ventricular internal cavity size was normal in size. There is  mild concentric left ventricular hypertrophy. Left ventricular diastolic parameters are consistent with Grade I diastolic dysfunction (impaired relaxation). Right Ventricle: The right ventricular size is normal. No  increase in right ventricular wall thickness. Right ventricular systolic function is normal. There is normal pulmonary artery systolic pressure. The tricuspid regurgitant velocity is 2.17 m/s, and  with an assumed right atrial pressure of 3 mmHg, the estimated right ventricular systolic pressure is 21.8 mmHg. Left Atrium: Left atrial size was normal in size. Right Atrium: Right atrial size was normal in size. Pericardium: There is no evidence of pericardial effusion. Mitral Valve: The mitral valve is normal in structure. Trivial mitral valve regurgitation. No evidence of mitral valve stenosis. Tricuspid Valve: The tricuspid valve is normal in structure. Tricuspid valve regurgitation is trivial. Aortic Valve: The aortic valve is tricuspid. Aortic valve regurgitation is not visualized. No aortic stenosis is present. Pulmonic Valve: The pulmonic valve was normal in structure. Pulmonic valve regurgitation is not visualized. Aorta: The aortic root is normal in size and structure. Venous: The inferior vena cava is normal in size with greater than 50% respiratory variability, suggesting right atrial pressure of 3 mmHg. IAS/Shunts: No atrial level shunt detected by color flow Doppler.  LEFT VENTRICLE PLAX 2D LVIDd:         3.40 cm   Diastology LVIDs:         2.10 cm   LV e' medial:    10.60 cm/s LV PW:         1.00 cm   LV E/e' medial:  7.4 LV IVS:        1.10 cm   LV e' lateral:   11.90 cm/s LVOT diam:     1.90 cm   LV E/e' lateral: 6.6 LVOT Area:     2.84 cm  RIGHT VENTRICLE             IVC RV Basal diam:  2.80 cm     IVC diam: 1.90 cm RV S prime:  11.40 cm/s TAPSE (M-mode): 2.8 cm LEFT ATRIUM             Index        RIGHT ATRIUM           Index LA diam:        2.20 cm 1.22 cm/m   RA Area:     17.40 cm LA Vol (A2C):   51.6 ml 28.66 ml/m  RA Volume:   42.70 ml  23.72 ml/m LA Vol (A4C):   43.9 ml 24.38 ml/m LA Biplane Vol: 47.8 ml 26.55 ml/m   AORTA Ao Root diam: 3.00 cm Ao Asc diam:  3.60 cm MITRAL VALVE                TRICUSPID VALVE MV Area (PHT): 3.60 cm    TR Peak grad:   18.8 mmHg MV Decel Time: 211 msec    TR Vmax:        217.00 cm/s MV E velocity: 78.00 cm/s MV A velocity: 71.10 cm/s  SHUNTS MV E/A ratio:  1.10        Systemic Diam: 1.90 cm Dalton McleanMD Electronically signed by Ezra Kanner Signature Date/Time: 06/26/2024/9:01:14 PM    Final    DG Abd 1 View Result Date: 06/26/2024 CLINICAL DATA:  Evaluate for foreign body.  Retained capsule. EXAM: ABDOMEN - 1 VIEW COMPARISON:  Abdominal x-ray 06/26/2024. FINDINGS: Previous identified rectangular foreign body overlying the colostomy is no longer seen. Surgical anastomotic staple line is again noted in the pelvis. There is contrast in the bladder and renal collecting systems. A can give a are likely external artifact is overlying the lower right hemipelvis which appears unchanged from prior. External snap artifact overlies the lateral left abdominal wall. No new unexpected radiopaque foreign body identified. Bowel-gas pattern is nonobstructive. No suspicious calcifications are seen. IMPRESSION: Previous identified rectangular foreign body overlying the colostomy is no longer seen. No new unexpected radiopaque foreign body identified. Electronically Signed   By: Greig Pique M.D.   On: 06/26/2024 19:18   CT Angio Chest Pulmonary Embolism (PE) W or WO Contrast Result Date: 06/26/2024 CLINICAL DATA:  History of bilateral DVTs, on Eliquis  EXAM: CT ANGIOGRAPHY CHEST WITH CONTRAST TECHNIQUE: Multidetector CT imaging of the chest was performed using the standard protocol during bolus administration of intravenous contrast. Multiplanar CT image reconstructions and MIPs were obtained to evaluate the vascular anatomy. RADIATION DOSE REDUCTION: This exam was performed according to the departmental dose-optimization program which includes automated exposure control, adjustment of the mA and/or kV according to patient size and/or use of iterative reconstruction  technique. CONTRAST:  75mL OMNIPAQUE  IOHEXOL  350 MG/ML SOLN COMPARISON:  April 20, 2024 FINDINGS: Pulmonary Embolism: Resolution of the acute embolus within the interlobar pulmonary artery in the right lung. Small amount of residual distal segmental/subsegmental embolus in the basilar segments of the right lower lobe. Increasing clot burden within the left upper lobe lingular branch, extending into the interlobar pulmonary artery and into the lobar branch of the left lower lobe. The distal segmental and subsegmental emboli in the left lower lobe on the prior study have resolved. Cardiovascular: No cardiomegaly or pericardial effusion.No aortic aneurysm. Subtle reflux of contrast into the hepatic veins. Mediastinum/Nodes: No mediastinal mass.Similar heterogeneous enlargement of the right thyroid  lobe measuring 4.5 cm in AP dimension.No mediastinal, hilar, or axillary lymphadenopathy. Lungs/Pleura: The midline trachea and bronchi are patent. Posterior bibasilar dependent atelectasis. No focal airspace consolidation, pleural effusion, or pneumothorax. Musculoskeletal: No acute fracture or destructive  bone lesion. Multilevel degenerative disc disease of the spine. Osteopenia. Upper Abdomen: No acute abnormality in the partially visualized upper abdomen. Review of the MIP images confirms the above findings. IMPRESSION: 1. Although there has been interval resolution of some of the pulmonary emboli on the prior study, there are new emboli in the left upper lobe lingular branch and left interlobar pulmonary arteries. While there is subtle reflux of contrast into the hepatic veins, as can be seen in elevated right heart pressures or right heart strain, the RV to LV ratio remains normal and there are no additional findings of right heart strain. Laboratory correlation recommended. If this remains of clinical concern, a follow-up echocardiogram should be considered. 2. No pneumonia, pulmonary edema, or pleural effusion. 3.  Similar appearance of the heterogeneously enlarged right thyroid  lobe. If not previously evaluated, a nonemergent thyroid  ultrasound is recommended for further characterization. Critical Value/emergent results were called by telephone at the time of interpretation on 06/26/2024 at 1501 to provider Dr. Jillian, who verbally acknowledged these results. Electronically Signed   By: Rogelia Myers M.D.   On: 06/26/2024 15:01   DG Abd 1 View Result Date: 06/26/2024 CLINICAL DATA:  [id number] Foreign body alimentary tract, subsequent encounter [phone]. EXAM: ABDOMEN - 1 VIEW COMPARISON:  01/16/2020. FINDINGS: The bowel gas pattern is non-obstructive. Bowel anastomotic suture noted overlying the lower sacrum region. No evidence of pneumoperitoneum, within the limitations of a supine film. No acute osseous abnormalities. The soft tissues are within normal limits. Surgical changes, devices, tubes and lines: There is a rectangular 1.3 x 2.0 cm opacity overlying the right lower quadrant region superimposed on colostomy ring. It is unclear whether this is external to the patient. Correlate with physical examination. IMPRESSION: Nonobstructive bowel gas pattern. There is a rectangular 1.3 x 2.0 cm opacity overlying the right lower quadrant region superimposed on colostomy ring. It is unclear whether this is external to the patient. Correlate with physical examination. Electronically Signed   By: Ree Molt M.D.   On: 06/26/2024 11:40    Scheduled Meds:  Chlorhexidine  Gluconate Cloth  6 each Topical Daily   Continuous Infusions:     LOS: 4 days   Ivonne Jillian, MD [facility] P8/19/2025, 12:23 PM

## 2024-06-27 NOTE — Progress Notes (Signed)
 [facility] Progress Note  CC:  GI bleed   Subjective:  No recurrent bleeding at this point.  Hgb stable at 9.5 grams.  Seen with surgery today and husband on Facetime.  Objective:  Vital signs in last 24 hours: Temp:  [98 F (36.7 C)-98.9 F (37.2 C)] 98.7 F (37.1 C) (08/19 1314) Pulse Rate:  [68-74] 70 (08/19 1314) Resp:  [18-24] 19 (08/19 1314) BP: (111-138)/(67-83) 138/80 (08/19 1314) SpO2:  [98 %-100 %] 100 % (08/19 1314) Last BM Date : 06/27/24 General:  Alert, Well-developed, in NAD Heart:  Regular rate and rhythm; no murmurs Pulm:  CTAB anteriorly. Abdomen:  Soft, non-distended.  BS present.  Non-tender. Neurologic:  Alert and oriented x 4;  grossly normal neurologically. Psych:  Alert and cooperative. Normal mood and affect.  Intake/Output from previous day: 08/18 0701 - 08/19 0700 In: 418.1 [P.O.:360; IV Piggyback:58.1] Out: -   Lab Results: Recent Labs    06/25/24 0629 06/26/24 0311 06/27/24 0440  WBC 8.0 9.6 8.2  HGB 9.8* 9.6* 9.5*  HCT 29.8* 29.7* 30.0*  PLT 215 222 242   BMET Recent Labs    06/25/24 0629 06/26/24 0311 06/27/24 0440  NA 138 136 138  K 2.9* 3.2* 3.6  CL 103 106 107  CO2 25 23 24   GLUCOSE 95 121* 91  BUN 11 21 16   CREATININE 0.97 0.93 1.05*  CALCIUM  8.8* 8.3* 8.5*   LFT Recent Labs    06/25/24 0629  PROT 5.7*  ALBUMIN  2.8*  AST 15  ALT 10  ALKPHOS 56  BILITOT 0.8  BILIDIR <0.1  IBILI NOT CALCULATED   ECHOCARDIOGRAM COMPLETE Result Date: 06/26/2024    ECHOCARDIOGRAM REPORT   Patient Name:   Latasha Wood Date of Exam: 06/26/2024 Medical Rec #:  [id number]           Height:       63.0 in Accession #:    [id number]          Weight:       169.1 lb Date of Birth:  10/30/1956            BSA:          1.800 m Patient Age:    68 years            BP:           138/79 mmHg Patient Gender: F                   HR:           63 bpm. Exam Location:  Inpatient Procedure: 2D Echo, Cardiac Doppler and Color  Doppler (Both Spectral and Color            Flow Doppler were utilized during procedure). Indications:    pulmonary embolus  History:        Patient has prior history of Echocardiogram examinations, most                 recent 04/22/2024.  Sonographer:    Therisa Crouch Referring Phys: [id number] AMRIT ADHIKARI IMPRESSIONS  1. Left ventricular ejection fraction, by estimation, is 55 to 60%. The left ventricle has normal function. The left ventricle has no regional wall motion abnormalities. There is mild concentric left ventricular hypertrophy. Left ventricular diastolic parameters are consistent with Grade I diastolic dysfunction (impaired relaxation).  2. Right ventricular systolic function is normal. The right ventricular size is normal. There is normal pulmonary  artery systolic pressure. The estimated right ventricular systolic pressure is 21.8 mmHg.  3. The mitral valve is normal in structure. Trivial mitral valve regurgitation. No evidence of mitral stenosis.  4. The aortic valve is tricuspid. Aortic valve regurgitation is not visualized. No aortic stenosis is present.  5. The inferior vena cava is normal in size with greater than 50% respiratory variability, suggesting right atrial pressure of 3 mmHg. FINDINGS  Left Ventricle: Left ventricular ejection fraction, by estimation, is 55 to 60%. The left ventricle has normal function. The left ventricle has no regional wall motion abnormalities. The left ventricular internal cavity size was normal in size. There is  mild concentric left ventricular hypertrophy. Left ventricular diastolic parameters are consistent with Grade I diastolic dysfunction (impaired relaxation). Right Ventricle: The right ventricular size is normal. No increase in right ventricular wall thickness. Right ventricular systolic function is normal. There is normal pulmonary artery systolic pressure. The tricuspid regurgitant velocity is 2.17 m/s, and  with an assumed right atrial pressure of 3 mmHg,  the estimated right ventricular systolic pressure is 21.8 mmHg. Left Atrium: Left atrial size was normal in size. Right Atrium: Right atrial size was normal in size. Pericardium: There is no evidence of pericardial effusion. Mitral Valve: The mitral valve is normal in structure. Trivial mitral valve regurgitation. No evidence of mitral valve stenosis. Tricuspid Valve: The tricuspid valve is normal in structure. Tricuspid valve regurgitation is trivial. Aortic Valve: The aortic valve is tricuspid. Aortic valve regurgitation is not visualized. No aortic stenosis is present. Pulmonic Valve: The pulmonic valve was normal in structure. Pulmonic valve regurgitation is not visualized. Aorta: The aortic root is normal in size and structure. Venous: The inferior vena cava is normal in size with greater than 50% respiratory variability, suggesting right atrial pressure of 3 mmHg. IAS/Shunts: No atrial level shunt detected by color flow Doppler.  LEFT VENTRICLE PLAX 2D LVIDd:         3.40 cm   Diastology LVIDs:         2.10 cm   LV e' medial:    10.60 cm/s LV PW:         1.00 cm   LV E/e' medial:  7.4 LV IVS:        1.10 cm   LV e' lateral:   11.90 cm/s LVOT diam:     1.90 cm   LV E/e' lateral: 6.6 LVOT Area:     2.84 cm  RIGHT VENTRICLE             IVC RV Basal diam:  2.80 cm     IVC diam: 1.90 cm RV S prime:     11.40 cm/s TAPSE (M-mode): 2.8 cm LEFT ATRIUM             Index        RIGHT ATRIUM           Index LA diam:        2.20 cm 1.22 cm/m   RA Area:     17.40 cm LA Vol (A2C):   51.6 ml 28.66 ml/m  RA Volume:   42.70 ml  23.72 ml/m LA Vol (A4C):   43.9 ml 24.38 ml/m LA Biplane Vol: 47.8 ml 26.55 ml/m   AORTA Ao Root diam: 3.00 cm Ao Asc diam:  3.60 cm MITRAL VALVE               TRICUSPID VALVE MV Area (PHT): 3.60 cm    TR Peak  grad:   18.8 mmHg MV Decel Time: 211 msec    TR Vmax:        217.00 cm/s MV E velocity: 78.00 cm/s MV A velocity: 71.10 cm/s  SHUNTS MV E/A ratio:  1.10        Systemic Diam: 1.90 cm Dalton  McleanMD Electronically signed by Ezra Kanner Signature Date/Time: 06/26/2024/9:01:14 PM    Final    DG Abd 1 View Result Date: 06/26/2024 CLINICAL DATA:  Evaluate for foreign body.  Retained capsule. EXAM: ABDOMEN - 1 VIEW COMPARISON:  Abdominal x-ray 06/26/2024. FINDINGS: Previous identified rectangular foreign body overlying the colostomy is no longer seen. Surgical anastomotic staple line is again noted in the pelvis. There is contrast in the bladder and renal collecting systems. A can give a are likely external artifact is overlying the lower right hemipelvis which appears unchanged from prior. External snap artifact overlies the lateral left abdominal wall. No new unexpected radiopaque foreign body identified. Bowel-gas pattern is nonobstructive. No suspicious calcifications are seen. IMPRESSION: Previous identified rectangular foreign body overlying the colostomy is no longer seen. No new unexpected radiopaque foreign body identified. Electronically Signed   By: Greig Pique M.D.   On: 06/26/2024 19:18   CT Angio Chest Pulmonary Embolism (PE) W or WO Contrast Result Date: 06/26/2024 CLINICAL DATA:  History of bilateral DVTs, on Eliquis  EXAM: CT ANGIOGRAPHY CHEST WITH CONTRAST TECHNIQUE: Multidetector CT imaging of the chest was performed using the standard protocol during bolus administration of intravenous contrast. Multiplanar CT image reconstructions and MIPs were obtained to evaluate the vascular anatomy. RADIATION DOSE REDUCTION: This exam was performed according to the departmental dose-optimization program which includes automated exposure control, adjustment of the mA and/or kV according to patient size and/or use of iterative reconstruction technique. CONTRAST:  75mL OMNIPAQUE  IOHEXOL  350 MG/ML SOLN COMPARISON:  April 20, 2024 FINDINGS: Pulmonary Embolism: Resolution of the acute embolus within the interlobar pulmonary artery in the right lung. Small amount of residual distal  segmental/subsegmental embolus in the basilar segments of the right lower lobe. Increasing clot burden within the left upper lobe lingular branch, extending into the interlobar pulmonary artery and into the lobar branch of the left lower lobe. The distal segmental and subsegmental emboli in the left lower lobe on the prior study have resolved. Cardiovascular: No cardiomegaly or pericardial effusion.No aortic aneurysm. Subtle reflux of contrast into the hepatic veins. Mediastinum/Nodes: No mediastinal mass.Similar heterogeneous enlargement of the right thyroid  lobe measuring 4.5 cm in AP dimension.No mediastinal, hilar, or axillary lymphadenopathy. Lungs/Pleura: The midline trachea and bronchi are patent. Posterior bibasilar dependent atelectasis. No focal airspace consolidation, pleural effusion, or pneumothorax. Musculoskeletal: No acute fracture or destructive bone lesion. Multilevel degenerative disc disease of the spine. Osteopenia. Upper Abdomen: No acute abnormality in the partially visualized upper abdomen. Review of the MIP images confirms the above findings. IMPRESSION: 1. Although there has been interval resolution of some of the pulmonary emboli on the prior study, there are new emboli in the left upper lobe lingular branch and left interlobar pulmonary arteries. While there is subtle reflux of contrast into the hepatic veins, as can be seen in elevated right heart pressures or right heart strain, the RV to LV ratio remains normal and there are no additional findings of right heart strain. Laboratory correlation recommended. If this remains of clinical concern, a follow-up echocardiogram should be considered. 2. No pneumonia, pulmonary edema, or pleural effusion. 3. Similar appearance of the heterogeneously enlarged right thyroid  lobe. If not previously  evaluated, a nonemergent thyroid  ultrasound is recommended for further characterization. Critical Value/emergent results were called by telephone at the  time of interpretation on 06/26/2024 at 1501 to provider Dr. Jillian, who verbally acknowledged these results. Electronically Signed   By: Rogelia Myers M.D.   On: 06/26/2024 15:01   DG Abd 1 View Result Date: 06/26/2024 CLINICAL DATA:  [id number] Foreign body alimentary tract, subsequent encounter [phone]. EXAM: ABDOMEN - 1 VIEW COMPARISON:  01/16/2020. FINDINGS: The bowel gas pattern is non-obstructive. Bowel anastomotic suture noted overlying the lower sacrum region. No evidence of pneumoperitoneum, within the limitations of a supine film. No acute osseous abnormalities. The soft tissues are within normal limits. Surgical changes, devices, tubes and lines: There is a rectangular 1.3 x 2.0 cm opacity overlying the right lower quadrant region superimposed on colostomy ring. It is unclear whether this is external to the patient. Correlate with physical examination. IMPRESSION: Nonobstructive bowel gas pattern. There is a rectangular 1.3 x 2.0 cm opacity overlying the right lower quadrant region superimposed on colostomy ring. It is unclear whether this is external to the patient. Correlate with physical examination. Electronically Signed   By: Ree Molt M.D.   On: 06/26/2024 11:40   Assessment / Plan: 68 year old female with history of ulcerative colitis status post total proctocolectomy complicated by ostomy dysfunction in 2016-2017, with recent diagnosis of bilateral pulmonary emboli/DVT in June this year, followed by hospitalization for GI bleed of unclear etiology (negative EGD), ongoing issues with iron  deficiency anemia, with normal ileoscopy as outpatient on August 14, followed by second admission for bleeding August 15.  CTA with suspected bleeding source in the small intestine.  Patient experienced transient hemorrhagic shock evening of August 15, requiring urgent transfusion, volume resuscitation and reversal of anticoagulation with Kcentra , TXA and vitamin K.  Patient was taken to IR suite for  mesenteric angiogram, which was negative for active extravasation.  Repeat ileoscopy on 8/17 also with no source of bleeding found.  Patency capsule ingested on 8/17.   GI bleeding, unclear etiology: Hemoglobin stable at 9.5 g today.  Unfortunately patency capsule did not pass/was retained.  High risk of VCE being retained.  PE/DVT:  Appears to have new pulmonary emboli on CTA from yesterday.  ? IVC filter.  -Appreciate surgery's input. -Will consult IR for IVC filter. -If she re-bleeds then recommend repeat CTA with re-attempt at IR embollization.     LOS: 4 days   Harlene BIRCH. Timothee Gali  06/27/2024, 2:27 PM

## 2024-06-27 NOTE — Plan of Care (Signed)

## 2024-06-27 NOTE — Progress Notes (Signed)
 Referring Physician(s): Mansouraty,G  Supervising Physician: Hughes Simmonds  Patient Status:  [name]  Chief Complaint: Recent gastrointestinal bleeding, pulmonary emboli   Subjective: Patient known to IR team from PICC line placement in 2017,and negative  mesenteric arteriogram on 06/23/2024.  She is a 68 year old female with history of ulcerative colitis and 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  She has a recent diagnosis of bilateral pulmonary emboli/DVT in June of this year followed by hospitalization for GI bleed of unclear etiology, issues with iron  deficiency anemia with normal ileoscopy as outpatient on August 14 followed by a second admission for bleeding on 06/23/2024.  Negative mesenteric arteriogram was performed.  Anticoagulation was reversed with Kcentra , TXA and vitamin K.  Repeat ileoscopy on 8/17 with no source of bleeding found.  CT angiogram of chest done yesterday revealed interval resolution of some of the pulmonary emboli noted on prior study but new emboli in the left upper lobe lingula branch and left interlobar pulmonary arteries.  Patient reports no bleeding today.  Hemoglobin today 9.5(9.6), WBC normal, platelets normal, creatinine 1.05.  Request now received from GI team for IVC filter placement.  Patient currently denies fever, headache, chest pain, abdominal pain, back pain, nausea, vomiting; she does have some dyspnea, occasional cough  Past Medical History:  Diagnosis Date   Anemia    Anxiety    Arthritis    Blood transfusion    Bowel obstruction (HCC)    Colitis, ulcerative (HCC)    Colon polyps    DVT (deep venous thrombosis) (HCC)    Hyperlipidemia    Hypertension    Ileostomy present (HCC)    Kidney disease    Numbness and tingling    hands and feet bilat    Pulmonary embolism (HCC)    Shortness of breath dyspnea    talking or walking    Type 2 diabetes mellitus with complication, without long-term  current use of insulin  (HCC) 09/05/2015   currently on no medications, (05/26/2016) pt denies diabetes.  States that she had one time high blood sugars d/t prednisone    Past Surgical History:  Procedure Laterality Date   ABDOMINAL HYSTERECTOMY  2000   ABDOMINAL SURGERY     BREAST CYST ASPIRATION     CATARACT EXTRACTION W/PHACO Right 10/20/2017   Procedure: CATARACT EXTRACTION PHACO AND INTRAOCULAR LENS PLACEMENT (IOC);  Surgeon: Myrna Adine Anes, MD;  Location: [facility]S;  Service: Ophthalmology;  Laterality: Right;  US  00:50.1 AP% 13.2 CDE 6.61 Fluid Pack lot # [id number]   COLON SURGERY     colostomy   cortisol shot     in left shoulder   DILATION AND CURETTAGE OF UTERUS     most likely after miscarriage   ESOPHAGOGASTRODUODENOSCOPY N/A 05/03/2024   Procedure: EGD (ESOPHAGOGASTRODUODENOSCOPY);  Surgeon: Charlanne Groom, MD;  Location: [facility];  Service: Gastroenterology;  Laterality: N/A;   fibrocystic breast disease     q 6 month mammogram   gravida 6 para 2     all SAB   hyadiform mole     ILEO LOOP DIVERSION N/A 09/25/2015   Procedure: ILEO LOOP COLOSTOMY;  Surgeon: Bernarda Ned, MD;  Location: [facility];  Service: General;  Laterality: N/A;   ILEOSTOMY CLOSURE N/A 10/08/2015   Procedure: ILEOSTOMY REVISION;  Surgeon: Bernarda Ned, MD;  Location: [facility];  Service: General;  Laterality: N/A;   ILEOSTOMY CLOSURE N/A 08/20/2016   Procedure: OPEN RELOCATION OF ILEOSTOMY;  Surgeon: Bernarda Ned, MD;  Location: [facility];  Service: General;  Laterality: N/A;   IR ANGIOGRAM SELECTIVE EACH ADDITIONAL VESSEL  06/23/2024   IR ANGIOGRAM SELECTIVE EACH ADDITIONAL VESSEL  06/23/2024   IR ANGIOGRAM VISCERAL SELECTIVE  06/23/2024   IR GENERIC HISTORICAL  09/01/2016   IR US  GUIDE VASC ACCESS RIGHT 09/01/2016 Franky Rusk, PA-C [facility]   IR GENERIC HISTORICAL  09/01/2016   IR FLUORO GUIDE CV LINE RIGHT 09/01/2016 Franky Rusk, PA-C [facility]   IR US  GUIDE VASC ACCESS RIGHT  06/23/2024    KNEE ARTHROSCOPY W/ MENISCAL REPAIR     right knee ([place]orf)   LAPAROSCOPIC SMALL BOWEL RESECTION N/A 09/25/2015   Procedure: LAPAROSCOPIC BOWEL RESECTION TIMES TWO;  Surgeon: Bernarda Ned, MD;  Location: [facility];  Service: General;  Laterality: N/A;   ROBOTIC ASSISTED LAPAROSCOPIC LYSIS OF ADHESION N/A 09/25/2015   Procedure: XI ROBOTIC ASSISTED LAPAROSCOPIC LYSIS OF ADHESION;  Surgeon: Bernarda Ned, MD;  Location: [facility];  Service: General;  Laterality: N/A;  90 minutes   Small bowel obstruction        Allergies: Betadine  [povidone iodine ], Latex, and Tape  Medications: Prior to Admission medications   Medication Sig Start Date End Date Taking? Authorizing Provider  ALPRAZolam  (XANAX ) 0.25 MG tablet Take 1 tablet (0.25 mg total) by mouth 2 (two) times daily as needed for anxiety. 03/06/24  Yes Joshua Ned CROME, MD  apixaban  (ELIQUIS ) 5 MG TABS tablet Take 1 tablet (5 mg total) by mouth 2 (two) times daily. 05/02/24  Yes Thayil, Irene T, PA-C  augmented betamethasone dipropionate (DIPROLENE-AF) 0.05 % cream Apply 1 Application topically 2 (two) times daily as needed (Dermatitis). 06/01/24  Yes [provider]  cholecalciferol (VITAMIN D3) 25 MCG (1000 UNIT) tablet Take 1,000 Units by mouth daily.   Yes [provider]  hydrocortisone  2.5 % cream APPLY TOPICALLY TWICE DAILY Patient taking differently: Apply 1 Application topically 2 (two) times daily as needed (Dermatitis). 05/17/24  Yes Joshua Ned CROME, MD  indapamide  (LOZOL ) 1.25 MG tablet TAKE 1 TABLET BY MOUTH DAILY 05/17/24  Yes Joshua Ned CROME, MD  losartan  (COZAAR ) 50 MG tablet Take 1 tablet (50 mg total) by mouth daily. 05/22/24  Yes Joshua Ned CROME, MD  Multiple Vitamin (MULTIVITAMIN WITH MINERALS) TABS tablet Take 1 tablet by mouth daily with breakfast.   Yes [provider]  nystatin  cream (MYCOSTATIN ) Apply 1 Application topically 2 (two) times daily. Patient taking differently: Apply 1 Application  topically 2 (two) times daily as needed for dry skin. 04/24/24  Yes [place], PA-C  pantoprazole  (PROTONIX ) 40 MG tablet Take 1 tablet (40 mg total) by mouth daily. 05/16/24  Yes [place], PA-C  potassium chloride  (KLOR-CON ) 10 MEQ tablet TAKE 1 TABLET BY MOUTH 3 TIMES  DAILY 05/17/24  Yes Joshua Ned CROME, MD     Vital Signs: BP 138/80 (BP Location: Left Arm)   Pulse 70   Temp 98.7 F (37.1 C) (Oral)   Resp 19   Ht 5' 3 (1.6 m)   Wt 169 lb 1.5 oz (76.7 kg)   SpO2 100%   BMI 29.95 kg/m   Physical Exam: Awake, alert.  Chest clear to auscultation bilaterally.  Heart with regular rate and rhythm.  Abdomen soft, intact ostomy, few bowel sounds, currently nontender.  No significant lower extremity edema  Imaging: ECHOCARDIOGRAM COMPLETE Result Date: 06/26/2024    ECHOCARDIOGRAM REPORT   Patient Name:   Latasha Wood Date of Exam: 06/26/2024 Medical Rec #:  [id number]  Height:       63.0 in Accession #:    [id number]          Weight:       169.1 lb Date of Birth:  July 16, 1956            BSA:          1.800 m Patient Age:    68 years            BP:           138/79 mmHg Patient Gender: F                   HR:           63 bpm. Exam Location:  Inpatient Procedure: 2D Echo, Cardiac Doppler and Color Doppler (Both Spectral and Color            Flow Doppler were utilized during procedure). Indications:    pulmonary embolus  History:        Patient has prior history of Echocardiogram examinations, most                 recent 04/22/2024.  Sonographer:    Therisa Crouch Referring Phys: [id number] AMRIT ADHIKARI IMPRESSIONS  1. Left ventricular ejection fraction, by estimation, is 55 to 60%. The left ventricle has normal function. The left ventricle has no regional wall motion abnormalities. There is mild concentric left ventricular hypertrophy. Left ventricular diastolic parameters are consistent with Grade I diastolic dysfunction (impaired relaxation).  2. Right ventricular systolic function is  normal. The right ventricular size is normal. There is normal pulmonary artery systolic pressure. The estimated right ventricular systolic pressure is 21.8 mmHg.  3. The mitral valve is normal in structure. Trivial mitral valve regurgitation. No evidence of mitral stenosis.  4. The aortic valve is tricuspid. Aortic valve regurgitation is not visualized. No aortic stenosis is present.  5. The inferior vena cava is normal in size with greater than 50% respiratory variability, suggesting right atrial pressure of 3 mmHg. FINDINGS  Left Ventricle: Left ventricular ejection fraction, by estimation, is 55 to 60%. The left ventricle has normal function. The left ventricle has no regional wall motion abnormalities. The left ventricular internal cavity size was normal in size. There is  mild concentric left ventricular hypertrophy. Left ventricular diastolic parameters are consistent with Grade I diastolic dysfunction (impaired relaxation). Right Ventricle: The right ventricular size is normal. No increase in right ventricular wall thickness. Right ventricular systolic function is normal. There is normal pulmonary artery systolic pressure. The tricuspid regurgitant velocity is 2.17 m/s, and  with an assumed right atrial pressure of 3 mmHg, the estimated right ventricular systolic pressure is 21.8 mmHg. Left Atrium: Left atrial size was normal in size. Right Atrium: Right atrial size was normal in size. Pericardium: There is no evidence of pericardial effusion. Mitral Valve: The mitral valve is normal in structure. Trivial mitral valve regurgitation. No evidence of mitral valve stenosis. Tricuspid Valve: The tricuspid valve is normal in structure. Tricuspid valve regurgitation is trivial. Aortic Valve: The aortic valve is tricuspid. Aortic valve regurgitation is not visualized. No aortic stenosis is present. Pulmonic Valve: The pulmonic valve was normal in structure. Pulmonic valve regurgitation is not visualized. Aorta: The  aortic root is normal in size and structure. Venous: The inferior vena cava is normal in size with greater than 50% respiratory variability, suggesting right atrial pressure of 3 mmHg. IAS/Shunts: No atrial level shunt detected by color flow Doppler.  LEFT VENTRICLE PLAX 2D LVIDd:         3.40 cm   Diastology LVIDs:         2.10 cm   LV e' medial:    10.60 cm/s LV PW:         1.00 cm   LV E/e' medial:  7.4 LV IVS:        1.10 cm   LV e' lateral:   11.90 cm/s LVOT diam:     1.90 cm   LV E/e' lateral: 6.6 LVOT Area:     2.84 cm  RIGHT VENTRICLE             IVC RV Basal diam:  2.80 cm     IVC diam: 1.90 cm RV S prime:     11.40 cm/s TAPSE (M-mode): 2.8 cm LEFT ATRIUM             Index        RIGHT ATRIUM           Index LA diam:        2.20 cm 1.22 cm/m   RA Area:     17.40 cm LA Vol (A2C):   51.6 ml 28.66 ml/m  RA Volume:   42.70 ml  23.72 ml/m LA Vol (A4C):   43.9 ml 24.38 ml/m LA Biplane Vol: 47.8 ml 26.55 ml/m   AORTA Ao Root diam: 3.00 cm Ao Asc diam:  3.60 cm MITRAL VALVE               TRICUSPID VALVE MV Area (PHT): 3.60 cm    TR Peak grad:   18.8 mmHg MV Decel Time: 211 msec    TR Vmax:        217.00 cm/s MV E velocity: 78.00 cm/s MV A velocity: 71.10 cm/s  SHUNTS MV E/A ratio:  1.10        Systemic Diam: 1.90 cm Dalton McleanMD Electronically signed by Ezra Kanner Signature Date/Time: 06/26/2024/9:01:14 PM    Final    DG Abd 1 View Result Date: 06/26/2024 CLINICAL DATA:  Evaluate for foreign body.  Retained capsule. EXAM: ABDOMEN - 1 VIEW COMPARISON:  Abdominal x-ray 06/26/2024. FINDINGS: Previous identified rectangular foreign body overlying the colostomy is no longer seen. Surgical anastomotic staple line is again noted in the pelvis. There is contrast in the bladder and renal collecting systems. A can give a are likely external artifact is overlying the lower right hemipelvis which appears unchanged from prior. External snap artifact overlies the lateral left abdominal wall. No new unexpected  radiopaque foreign body identified. Bowel-gas pattern is nonobstructive. No suspicious calcifications are seen. IMPRESSION: Previous identified rectangular foreign body overlying the colostomy is no longer seen. No new unexpected radiopaque foreign body identified. Electronically Signed   By: Greig Pique M.D.   On: 06/26/2024 19:18   CT Angio Chest Pulmonary Embolism (PE) W or WO Contrast Result Date: 06/26/2024 CLINICAL DATA:  History of bilateral DVTs, on Eliquis  EXAM: CT ANGIOGRAPHY CHEST WITH CONTRAST TECHNIQUE: Multidetector CT imaging of the chest was performed using the standard protocol during bolus administration of intravenous contrast. Multiplanar CT image reconstructions and MIPs were obtained to evaluate the vascular anatomy. RADIATION DOSE REDUCTION: This exam was performed according to the departmental dose-optimization program which includes automated exposure control, adjustment of the mA and/or kV according to patient size and/or use of iterative reconstruction technique. CONTRAST:  75mL OMNIPAQUE  IOHEXOL  350 MG/ML SOLN COMPARISON:  April 20, 2024 FINDINGS: Pulmonary Embolism: Resolution  of the acute embolus within the interlobar pulmonary artery in the right lung. Small amount of residual distal segmental/subsegmental embolus in the basilar segments of the right lower lobe. Increasing clot burden within the left upper lobe lingular branch, extending into the interlobar pulmonary artery and into the lobar branch of the left lower lobe. The distal segmental and subsegmental emboli in the left lower lobe on the prior study have resolved. Cardiovascular: No cardiomegaly or pericardial effusion.No aortic aneurysm. Subtle reflux of contrast into the hepatic veins. Mediastinum/Nodes: No mediastinal mass.Similar heterogeneous enlargement of the right thyroid  lobe measuring 4.5 cm in AP dimension.No mediastinal, hilar, or axillary lymphadenopathy. Lungs/Pleura: The midline trachea and bronchi are  patent. Posterior bibasilar dependent atelectasis. No focal airspace consolidation, pleural effusion, or pneumothorax. Musculoskeletal: No acute fracture or destructive bone lesion. Multilevel degenerative disc disease of the spine. Osteopenia. Upper Abdomen: No acute abnormality in the partially visualized upper abdomen. Review of the MIP images confirms the above findings. IMPRESSION: 1. Although there has been interval resolution of some of the pulmonary emboli on the prior study, there are new emboli in the left upper lobe lingular branch and left interlobar pulmonary arteries. While there is subtle reflux of contrast into the hepatic veins, as can be seen in elevated right heart pressures or right heart strain, the RV to LV ratio remains normal and there are no additional findings of right heart strain. Laboratory correlation recommended. If this remains of clinical concern, a follow-up echocardiogram should be considered. 2. No pneumonia, pulmonary edema, or pleural effusion. 3. Similar appearance of the heterogeneously enlarged right thyroid  lobe. If not previously evaluated, a nonemergent thyroid  ultrasound is recommended for further characterization. Critical Value/emergent results were called by telephone at the time of interpretation on 06/26/2024 at 1501 to provider Dr. Jillian, who verbally acknowledged these results. Electronically Signed   By: Rogelia Myers M.D.   On: 06/26/2024 15:01   DG Abd 1 View Result Date: 06/26/2024 CLINICAL DATA:  [id number] Foreign body alimentary tract, subsequent encounter [phone]. EXAM: ABDOMEN - 1 VIEW COMPARISON:  01/16/2020. FINDINGS: The bowel gas pattern is non-obstructive. Bowel anastomotic suture noted overlying the lower sacrum region. No evidence of pneumoperitoneum, within the limitations of a supine film. No acute osseous abnormalities. The soft tissues are within normal limits. Surgical changes, devices, tubes and lines: There is a rectangular 1.3 x 2.0 cm  opacity overlying the right lower quadrant region superimposed on colostomy ring. It is unclear whether this is external to the patient. Correlate with physical examination. IMPRESSION: Nonobstructive bowel gas pattern. There is a rectangular 1.3 x 2.0 cm opacity overlying the right lower quadrant region superimposed on colostomy ring. It is unclear whether this is external to the patient. Correlate with physical examination. Electronically Signed   By: Ree Molt M.D.   On: 06/26/2024 11:40   VAS US  LOWER EXTREMITY VENOUS (DVT) Result Date: 06/25/2024  Lower Venous DVT Study Patient Name:  NELY DEDMON  Date of Exam:   06/25/2024 Medical Rec #: [id number]            Accession #:    [id number] Date of Birth: 06/01/1956             Patient Gender: F Patient Age:   53 years Exam Location:  [facility] Procedure:      VAS US  LOWER EXTREMITY VENOUS (DVT) Referring Phys: DORN CHILL --------------------------------------------------------------------------------  Indications: Pulmonary embolism.  Risk Factors: DVT. Limitations: Bandages, poor ultrasound/tissue interface and line. Comparison Study:  04/21/2024 - RIGHT:                   - Findings consistent with acute deep vein thrombosis                   involving the right peroneal veins.                   - No cystic structure found in the popliteal fossa.                    LEFT:                   - Findings consistent with acute, non occlusive DVT noted in                   the left proximal femoral vein and profunda vein.                   - No cystic structure found in the popliteal fossa. Performing Technologist: Cordella Wood RVT  Examination Guidelines: A complete evaluation includes B-mode imaging, spectral Doppler, color Doppler, and power Doppler as needed of all accessible portions of each vessel. Bilateral testing is considered an integral part of a complete examination. Limited examinations for reoccurring indications may be  performed as noted. The reflux portion of the exam is performed with the patient in reverse Trendelenburg.  +---------+---------------+---------+-----------+----------+--------------+ RIGHT    CompressibilityPhasicitySpontaneityPropertiesThrombus Aging +---------+---------------+---------+-----------+----------+--------------+ CFV      Full           Yes      Yes                                 +---------+---------------+---------+-----------+----------+--------------+ SFJ      Full                                                        +---------+---------------+---------+-----------+----------+--------------+ FV Prox  Full                                                        +---------+---------------+---------+-----------+----------+--------------+ FV Mid   Full                                                        +---------+---------------+---------+-----------+----------+--------------+ FV DistalFull                                                        +---------+---------------+---------+-----------+----------+--------------+ PFV      Full                                                        +---------+---------------+---------+-----------+----------+--------------+  POP      Full           Yes      Yes                                 +---------+---------------+---------+-----------+----------+--------------+ PTV      Full                                                        +---------+---------------+---------+-----------+----------+--------------+ PERO     Full                                                        +---------+---------------+---------+-----------+----------+--------------+   +---------+---------------+---------+-----------+----------+--------------+ LEFT     CompressibilityPhasicitySpontaneityPropertiesThrombus Aging +---------+---------------+---------+-----------+----------+--------------+ CFV      Full            Yes      Yes                                 +---------+---------------+---------+-----------+----------+--------------+ SFJ      Full                                                        +---------+---------------+---------+-----------+----------+--------------+ FV Prox  Full                                                        +---------+---------------+---------+-----------+----------+--------------+ FV Mid   Full                                                        +---------+---------------+---------+-----------+----------+--------------+ FV DistalFull                                                        +---------+---------------+---------+-----------+----------+--------------+ PFV      Full                                                        +---------+---------------+---------+-----------+----------+--------------+ POP      Full           Yes      Yes                                 +---------+---------------+---------+-----------+----------+--------------+  PTV      Full                                                        +---------+---------------+---------+-----------+----------+--------------+ PERO     Full                                                        +---------+---------------+---------+-----------+----------+--------------+     Summary: RIGHT: - There is no evidence of deep vein thrombosis in the lower extremity.  - No cystic structure found in the popliteal fossa.  LEFT: - There is no evidence of deep vein thrombosis in the lower extremity.  - No cystic structure found in the popliteal fossa.  *See table(s) above for measurements and observations. Electronically signed by Penne Colorado MD on 06/25/2024 at 11:42:43 AM.    Final    IR Angiogram Visceral Selective Result Date: 06/24/2024 INDICATION: GI bleed.  Question small bowel extravasation on CTA. EXAM: Title: MESENTERIC ARTERIOGRAM Procedures: 1.  ULTRASOUND-GUIDED RIGHT COMMON FEMORAL ARTERIAL ACCESS 2. MESENTERIC ARTERIOGRAPHY, including SMA and SMA BRANCH SELECTIVE ANGIOGRAPHY COMPARISON:  CTA AP, 06/23/2024 and 06/03/2024. MEDICATIONS: None ANESTHESIA/SEDATION: Local anesthetic and single agent sedation was employed during this procedure. A total of fentanyl  100 mcg was administered intravenously. The patient's level of consciousness and vital signs were monitored continuously by radiology nursing throughout the procedure under my direct supervision. CONTRAST:  50 mL Omnipaque  300 FLUOROSCOPY: Radiation Exposure Index and estimated peak skin dose (PSD); Reference air kerma (RAK), 112 mGy. COMPLICATIONS: None immediate. PROCEDURE: Informed consent was obtained from the patient and/or patient's representative following explanation of the procedure, risks, benefits and alternatives. All questions were addressed. A time out was performed prior to the initiation of the procedure. Maximal barrier sterile technique utilized including caps, mask, sterile gowns, sterile gloves, large sterile drape, hand hygiene, and chlorhexidine  prep. The RIGHT femoral head was marked fluoroscopically. Ultrasound-guided access of the RIGHT femoral artery was obtained, allowing placement of a 5 Fr, 35 cm Brite tip vascular sheath. An ultrasound image was saved to PACS. A limited arteriogram was performed through the side arm of the sheath confirming appropriate access within the RIGHT common femoral artery. Over a Bentson wire, a Mickelson catheter was advanced the caudal aspect of the thoracic aorta where was reformed, back bled and flushed. The catheter was then utilized to select the superior mesenteric artery and selective arteriogram was performed. Distal SMA branch access was obtained with a 2.4 Fr Progreat alpha microcatheter and 0.016 inch Fathom microwire. Super selective arteriograms were performed. Images were reviewed and the procedure was terminated. All wires and  catheters were removed from the patient. Hemostasis was achieved at the RIGHT groin access site with Angio-Seal closure device. The patient tolerated the procedure well without immediate post procedural complication. FINDINGS: *Access via the RIGHT femoral artery. *No active extravasation of contrast on mesenteric arteriography, either from the SMA or distal SMA branch on selective or super selective angiography. IMPRESSION: 1. Mesenteric arteriography, including SMA and distal SMA branch super selective interrogation. 2. No active contrast extravasation on catheter angiography. No embolization was performed. Thom Hall, MD [facility]  Radiology Electronically Signed   By: Thom Hall M.D.   On: 06/24/2024 14:27   IR US  Guide Vasc Access Right Result Date: 06/24/2024 INDICATION: GI bleed.  Question small bowel extravasation on CTA. EXAM: Title: MESENTERIC ARTERIOGRAM Procedures: 1. ULTRASOUND-GUIDED RIGHT COMMON FEMORAL ARTERIAL ACCESS 2. MESENTERIC ARTERIOGRAPHY, including SMA and SMA BRANCH SELECTIVE ANGIOGRAPHY COMPARISON:  CTA AP, 06/23/2024 and 06/03/2024. MEDICATIONS: None ANESTHESIA/SEDATION: Local anesthetic and single agent sedation was employed during this procedure. A total of fentanyl  100 mcg was administered intravenously. The patient's level of consciousness and vital signs were monitored continuously by radiology nursing throughout the procedure under my direct supervision. CONTRAST:  50 mL Omnipaque  300 FLUOROSCOPY: Radiation Exposure Index and estimated peak skin dose (PSD); Reference air kerma (RAK), 112 mGy. COMPLICATIONS: None immediate. PROCEDURE: Informed consent was obtained from the patient and/or patient's representative following explanation of the procedure, risks, benefits and alternatives. All questions were addressed. A time out was performed prior to the initiation of the procedure. Maximal barrier sterile technique utilized  including caps, mask, sterile gowns, sterile gloves, large sterile drape, hand hygiene, and chlorhexidine  prep. The RIGHT femoral head was marked fluoroscopically. Ultrasound-guided access of the RIGHT femoral artery was obtained, allowing placement of a 5 Fr, 35 cm Brite tip vascular sheath. An ultrasound image was saved to PACS. A limited arteriogram was performed through the side arm of the sheath confirming appropriate access within the RIGHT common femoral artery. Over a Bentson wire, a Mickelson catheter was advanced the caudal aspect of the thoracic aorta where was reformed, back bled and flushed. The catheter was then utilized to select the superior mesenteric artery and selective arteriogram was performed. Distal SMA branch access was obtained with a 2.4 Fr Progreat alpha microcatheter and 0.016 inch Fathom microwire. Super selective arteriograms were performed. Images were reviewed and the procedure was terminated. All wires and catheters were removed from the patient. Hemostasis was achieved at the RIGHT groin access site with Angio-Seal closure device. The patient tolerated the procedure well without immediate post procedural complication. FINDINGS: *Access via the RIGHT femoral artery. *No active extravasation of contrast on mesenteric arteriography, either from the SMA or distal SMA branch on selective or super selective angiography. IMPRESSION: 1. Mesenteric arteriography, including SMA and distal SMA branch super selective interrogation. 2. No active contrast extravasation on catheter angiography. No embolization was performed. Thom Hall, MD [facility] Radiology Electronically Signed   By: Thom Hall M.D.   On: 06/24/2024 14:27   IR Angiogram Selective Each Additional Vessel Result Date: 06/24/2024 INDICATION: GI bleed.  Question small bowel extravasation on CTA. EXAM: Title: MESENTERIC ARTERIOGRAM Procedures: 1. ULTRASOUND-GUIDED RIGHT COMMON  FEMORAL ARTERIAL ACCESS 2. MESENTERIC ARTERIOGRAPHY, including SMA and SMA BRANCH SELECTIVE ANGIOGRAPHY COMPARISON:  CTA AP, 06/23/2024 and 06/03/2024. MEDICATIONS: None ANESTHESIA/SEDATION: Local anesthetic and single agent sedation was employed during this procedure. A total of fentanyl  100 mcg was administered intravenously. The patient's level of consciousness and vital signs were monitored continuously by radiology nursing throughout the procedure under my direct supervision. CONTRAST:  50 mL Omnipaque  300 FLUOROSCOPY: Radiation Exposure Index and estimated peak skin dose (PSD); Reference air kerma (RAK), 112 mGy. COMPLICATIONS: None immediate. PROCEDURE: Informed consent was obtained from the patient and/or patient's representative following explanation of the procedure, risks, benefits and alternatives. All questions were addressed. A time out was performed prior to the initiation of the procedure. Maximal barrier sterile technique utilized including caps, mask, sterile gowns, sterile gloves, large sterile drape, hand  hygiene, and chlorhexidine  prep. The RIGHT femoral head was marked fluoroscopically. Ultrasound-guided access of the RIGHT femoral artery was obtained, allowing placement of a 5 Fr, 35 cm Brite tip vascular sheath. An ultrasound image was saved to PACS. A limited arteriogram was performed through the side arm of the sheath confirming appropriate access within the RIGHT common femoral artery. Over a Bentson wire, a Mickelson catheter was advanced the caudal aspect of the thoracic aorta where was reformed, back bled and flushed. The catheter was then utilized to select the superior mesenteric artery and selective arteriogram was performed. Distal SMA branch access was obtained with a 2.4 Fr Progreat alpha microcatheter and 0.016 inch Fathom microwire. Super selective arteriograms were performed. Images were reviewed and the procedure was terminated. All wires and catheters were removed from the  patient. Hemostasis was achieved at the RIGHT groin access site with Angio-Seal closure device. The patient tolerated the procedure well without immediate post procedural complication. FINDINGS: *Access via the RIGHT femoral artery. *No active extravasation of contrast on mesenteric arteriography, either from the SMA or distal SMA branch on selective or super selective angiography. IMPRESSION: 1. Mesenteric arteriography, including SMA and distal SMA branch super selective interrogation. 2. No active contrast extravasation on catheter angiography. No embolization was performed. Thom Hall, MD [facility] Radiology Electronically Signed   By: Thom Hall M.D.   On: 06/24/2024 14:27   IR Angiogram Selective Each Additional Vessel Result Date: 06/24/2024 INDICATION: GI bleed.  Question small bowel extravasation on CTA. EXAM: Title: MESENTERIC ARTERIOGRAM Procedures: 1. ULTRASOUND-GUIDED RIGHT COMMON FEMORAL ARTERIAL ACCESS 2. MESENTERIC ARTERIOGRAPHY, including SMA and SMA BRANCH SELECTIVE ANGIOGRAPHY COMPARISON:  CTA AP, 06/23/2024 and 06/03/2024. MEDICATIONS: None ANESTHESIA/SEDATION: Local anesthetic and single agent sedation was employed during this procedure. A total of fentanyl  100 mcg was administered intravenously. The patient's level of consciousness and vital signs were monitored continuously by radiology nursing throughout the procedure under my direct supervision. CONTRAST:  50 mL Omnipaque  300 FLUOROSCOPY: Radiation Exposure Index and estimated peak skin dose (PSD); Reference air kerma (RAK), 112 mGy. COMPLICATIONS: None immediate. PROCEDURE: Informed consent was obtained from the patient and/or patient's representative following explanation of the procedure, risks, benefits and alternatives. All questions were addressed. A time out was performed prior to the initiation of the procedure. Maximal barrier sterile technique utilized including caps, mask,  sterile gowns, sterile gloves, large sterile drape, hand hygiene, and chlorhexidine  prep. The RIGHT femoral head was marked fluoroscopically. Ultrasound-guided access of the RIGHT femoral artery was obtained, allowing placement of a 5 Fr, 35 cm Brite tip vascular sheath. An ultrasound image was saved to PACS. A limited arteriogram was performed through the side arm of the sheath confirming appropriate access within the RIGHT common femoral artery. Over a Bentson wire, a Mickelson catheter was advanced the caudal aspect of the thoracic aorta where was reformed, back bled and flushed. The catheter was then utilized to select the superior mesenteric artery and selective arteriogram was performed. Distal SMA branch access was obtained with a 2.4 Fr Progreat alpha microcatheter and 0.016 inch Fathom microwire. Super selective arteriograms were performed. Images were reviewed and the procedure was terminated. All wires and catheters were removed from the patient. Hemostasis was achieved at the RIGHT groin access site with Angio-Seal closure device. The patient tolerated the procedure well without immediate post procedural complication. FINDINGS: *Access via the RIGHT femoral artery. *No active extravasation of contrast on mesenteric arteriography, either from the SMA or distal SMA branch  on selective or super selective angiography. IMPRESSION: 1. Mesenteric arteriography, including SMA and distal SMA branch super selective interrogation. 2. No active contrast extravasation on catheter angiography. No embolization was performed. Thom Hall, MD [facility] Radiology Electronically Signed   By: Thom Hall M.D.   On: 06/24/2024 14:27    Labs:  CBC: Recent Labs    06/24/24 1826 06/25/24 0629 06/26/24 0311 06/27/24 0440  WBC 8.8 8.0 9.6 8.2  HGB 9.7* 9.8* 9.6* 9.5*  HCT 29.5* 29.8* 29.7* 30.0*  PLT 229 215 222 242    COAGS: Recent Labs    05/02/24 1931  05/04/24 0138 05/04/24 0957 05/04/24 2125 05/05/24 0538 05/05/24 1627  INR 1.1  --   --   --   --   --   APTT 29   < > 136* 115* 146* 91*   < > = values in this interval not displayed.    BMP: Recent Labs    06/24/24 0618 06/25/24 0629 06/26/24 0311 06/27/24 0440  NA 145 138 136 138  K 4.0 2.9* 3.2* 3.6  CL 107 103 106 107  CO2 27 25 23 24   GLUCOSE 112* 95 121* 91  BUN 25* 11 21 16   CALCIUM  8.1* 8.8* 8.3* 8.5*  CREATININE 0.39* 0.97 0.93 1.05*  GFRNONAA >60 >60 >60 58*    LIVER FUNCTION TESTS: Recent Labs    06/20/24 0945 06/23/24 1107 06/24/24 0618 06/25/24 0629  BILITOT 0.4 0.6 0.4 0.8  AST 15 20 75* 15  ALT 9 11 70* 10  ALKPHOS 86 79 124 56  PROT 7.4 7.2 5.1* 5.7*  ALBUMIN  3.9 3.2* 1.7* 2.8*    Assessment and Plan: 68 year old female with history of ulcerative colitis and 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  She has a recent diagnosis of bilateral pulmonary emboli/DVT in June of this year followed by hospitalization for GI bleed of unclear etiology, issues with iron  deficiency anemia with normal ileoscopy as outpatient on August 14 followed by a second admission for bleeding on 06/23/2024.  Negative mesenteric arteriogram was performed.  Anticoagulation was reversed with Kcentra , TXA and vitamin K.  Repeat ileoscopy on 8/17 with no source of bleeding found.  CT angiogram of chest done yesterday revealed interval resolution of some of the pulmonary emboli noted on prior study but new emboli in the left upper lobe lingula branch and left interlobar pulmonary arteries.  Latest lower extremity venous Doppler study was negative for DVT.  Patient reports no bleeding today.  Hemoglobin today 9.5(9.6), WBC normal, platelets normal, creatinine 1.05.  Request now received from GI team for IVC filter placement.  Case/imaging studies have been reviewed by Dr. Hall and patient appears to be suitable candidate for IVC filter placement.Risks  and benefits discussed with the patient /spouse including, but not limited to bleeding, infection, contrast induced renal failure, filter fracture or migration which can lead to emergency surgery or even death, strut penetration with damage or irritation to adjacent structures and caval thrombosis.  All of the patient's questions were answered, patient is agreeable to proceed. Consent signed and in chart.  Procedure tentatively scheduled for 8/20  Electronically Signed: D. Franky Rakers, PA-C 06/27/2024, 4:45 PM   I spent a total of 30 minutes at the the patient's bedside AND on the patient's hospital floor or unit, greater than 50% of which was counseling/coordinating care for inferior vena cava filter placement    Patient ID: Latasha Wood, female  DOB: [birth year], 68 y.o.   MRN: [id number]

## 2024-06-27 NOTE — Progress Notes (Signed)
 PT Cancellation Note  Patient Details Name: Latasha Wood MRN: [id number] DOB: 23-Mar-1956   Cancelled Treatment:    Reason Eval/Treat Not Completed: Patient not medically ready Pt found to have PE on 06/26/24; discussion re: IVC filter, pt currently not on anticoagulation. Will hold PT evaluation at this time.  Richerd Pinal, PT, DPT 06/27/24, 9:13 AM   [name] 06/27/2024, 9:12 AM

## 2024-06-28 ENCOUNTER — Encounter (HOSPITAL_COMMUNITY): Payer: Self-pay | Admitting: Gastroenterology

## 2024-06-28 ENCOUNTER — Inpatient Hospital Stay (HOSPITAL_COMMUNITY)

## 2024-06-28 ENCOUNTER — Ambulatory Visit

## 2024-06-28 DIAGNOSIS — I2699 Other pulmonary embolism without acute cor pulmonale: Secondary | ICD-10-CM | POA: Diagnosis not present

## 2024-06-28 DIAGNOSIS — T183XXA Foreign body in small intestine, initial encounter: Secondary | ICD-10-CM | POA: Diagnosis not present

## 2024-06-28 DIAGNOSIS — D509 Iron deficiency anemia, unspecified: Secondary | ICD-10-CM

## 2024-06-28 DIAGNOSIS — K922 Gastrointestinal hemorrhage, unspecified: Secondary | ICD-10-CM | POA: Diagnosis not present

## 2024-06-28 DIAGNOSIS — D62 Acute posthemorrhagic anemia: Secondary | ICD-10-CM | POA: Diagnosis not present

## 2024-06-28 HISTORY — PX: IR IVC FILTER PLMT / S&I /IMG GUID/MOD SED: IMG701

## 2024-06-28 HISTORY — PX: IR TUNNELED CENTRAL VENOUS CATH PLC W IMG: IMG1939

## 2024-06-28 LAB — CBC WITH DIFFERENTIAL/PLATELET
Abs Immature Granulocytes: 0.03 K/uL (ref 0.00–0.07)
Basophils Absolute: 0 K/uL (ref 0.0–0.1)
Basophils Relative: 0 %
Eosinophils Absolute: 0.3 K/uL (ref 0.0–0.5)
Eosinophils Relative: 4 %
HCT: 29 % — ABNORMAL LOW (ref 36.0–46.0)
Hemoglobin: 8.9 g/dL — ABNORMAL LOW (ref 12.0–15.0)
Immature Granulocytes: 0 %
Lymphocytes Relative: 26 %
Lymphs Abs: 2 K/uL (ref 0.7–4.0)
MCH: 26.3 pg (ref 26.0–34.0)
MCHC: 30.7 g/dL (ref 30.0–36.0)
MCV: 85.5 fL (ref 80.0–100.0)
Monocytes Absolute: 0.5 K/uL (ref 0.1–1.0)
Monocytes Relative: 6 %
Neutro Abs: 5 K/uL (ref 1.7–7.7)
Neutrophils Relative %: 64 %
Platelets: 236 K/uL (ref 150–400)
RBC: 3.39 MIL/uL — ABNORMAL LOW (ref 3.87–5.11)
RDW: 14.9 % (ref 11.5–15.5)
WBC: 7.8 K/uL (ref 4.0–10.5)
nRBC: 0 % (ref 0.0–0.2)

## 2024-06-28 LAB — PROTIME-INR
INR: 1 (ref 0.8–1.2)
Prothrombin Time: 13.6 s (ref 11.4–15.2)

## 2024-06-28 LAB — BASIC METABOLIC PANEL WITH GFR
Anion gap: 8 (ref 5–15)
BUN: 20 mg/dL (ref 8–23)
CO2: 22 mmol/L (ref 22–32)
Calcium: 8.5 mg/dL — ABNORMAL LOW (ref 8.9–10.3)
Chloride: 104 mmol/L (ref 98–111)
Creatinine, Ser: 1.04 mg/dL — ABNORMAL HIGH (ref 0.44–1.00)
GFR, Estimated: 58 mL/min — ABNORMAL LOW (ref >60)
Glucose, Bld: 92 mg/dL (ref 70–99)
Potassium: 3.5 mmol/L (ref 3.5–5.1)
Sodium: 134 mmol/L — ABNORMAL LOW (ref 135–145)

## 2024-06-28 MED ORDER — LIDOCAINE HCL 1 % IJ SOLN
INTRAMUSCULAR | Status: AC
  Filled 2024-06-28: qty 20

## 2024-06-28 MED ORDER — MIDAZOLAM HCL 2 MG/2ML IJ SOLN
INTRAMUSCULAR | Status: AC
  Filled 2024-06-28: qty 2

## 2024-06-28 MED ORDER — DIPHENHYDRAMINE HCL 25 MG PO CAPS
25.0000 mg | ORAL_CAPSULE | Freq: Four times a day (QID) | ORAL | Status: AC | PRN
  Administered 2024-06-28: 25 mg via ORAL
  Filled 2024-06-28: qty 1

## 2024-06-28 MED ORDER — LIDOCAINE HCL 1 % IJ SOLN
20.0000 mL | Freq: Once | INTRAMUSCULAR | Status: AC
  Administered 2024-06-28: 10 mL via INTRADERMAL

## 2024-06-28 MED ORDER — FENTANYL CITRATE (PF) 100 MCG/2ML IJ SOLN
INTRAMUSCULAR | Status: AC
  Filled 2024-06-28: qty 2

## 2024-06-28 MED ORDER — FENTANYL CITRATE (PF) 100 MCG/2ML IJ SOLN
INTRAMUSCULAR | Status: AC | PRN
  Administered 2024-06-28: 50 ug via INTRAVENOUS

## 2024-06-28 MED ORDER — DIPHENHYDRAMINE HCL 25 MG PO CAPS
25.0000 mg | ORAL_CAPSULE | Freq: Once | ORAL | Status: AC | PRN
  Administered 2024-06-28: 25 mg via ORAL
  Filled 2024-06-28: qty 1

## 2024-06-28 MED ORDER — MIDAZOLAM HCL 2 MG/2ML IJ SOLN
INTRAMUSCULAR | Status: AC | PRN
  Administered 2024-06-28: 1 mg via INTRAVENOUS

## 2024-06-28 MED ORDER — HYDROCORTISONE 1 % EX CREA
TOPICAL_CREAM | Freq: Three times a day (TID) | CUTANEOUS | Status: DC
  Filled 2024-06-28: qty 28

## 2024-06-28 NOTE — Progress Notes (Signed)
 PT Cancellation Note  Patient Details Name: Latasha Wood MRN: [id number] DOB: 08/21/1956   Cancelled Treatment:    Reason Eval/Treat Not Completed: Patient not medically ready  Pt found to have PE;  CT angio chest 8/18 shows interval resolution of previous PE, however shows new emboli in LUL and left interlobar pulmonary arteries  awaiting IVC filter, pt currently not on anticoagulation. Will hold PT evaluation at this time.   [facility] 06/28/2024, 3:41 PM

## 2024-06-28 NOTE — Procedures (Signed)
  Procedure:  1. Central venous catheter placement under US /fluoro 2. IVCgram and filter placement (retrievable)   Preprocedure diagnosis: The primary encounter diagnosis was Gastrointestinal hemorrhage, unspecified gastrointestinal hemorrhage type. Diagnoses of GI bleed and Iron  deficiency anemia, unspecified iron  deficiency anemia type were also pertinent to this visit. Postprocedure diagnosis: same EBL:    minimal Complications:   none immediate  See full dictation in [employer].  CHARM Toribio Faes MD Main # [phone] Pager  [phone] Mobile [phone]

## 2024-06-28 NOTE — Progress Notes (Addendum)
 Latasha Wood   DOB:07/26/1956   FM#:[id number]      ASSESSMENT & PLAN:  Latasha Wood is a 68 year old female patient with hematologic history significant for acute PE/DVT. She was admitted on 06/24/2024 with concerns for GI bleed.   GI bleed Hx of UC - Unclear etiology per GI -- status post total proctocolectomy, ostomy intact - Status post ileoscopy 8/14 as outpatient.   -- Subsequently taken to IR for mesenteric angiogram.   -- Status post capsule ingested 8/17 - GI following closely   History of PE/DVT - Diagnosed 04/2024 with acute DVT bilateral LE - Has been on Eliquis  which is currently on hold due to GI bleed - Dopplers 8/17 negative.  Patient concerned about possibility of PE therefore CT angio chest ordered. -- CT angio chest 8/18 shows interval resolution of previous PE, however shows new emboli in LUL and left interlobar pulmonary arteries. No heart strain.  Decision made to continue to hold [facility] due to bleed.  - Pending IVC filter placement by IR. - Hematology/Dr. Federico following closely.   Anemia Iron  deficiency anemia - Hemoglobin 8.9 today - Patient with low iron  stores.  She had Wood appointment for IV iron  infusion on 8/20 as Wood outpatient and a second outpatient infusion appointment 8/27. Ordered IV iron  infusion for 8/20. -- Follow up with heme outpatient for next IV iron  on 8/27. - No PRBC transfusion required at this time. - Continue to monitor CBC with differential.    Code Status Full   Subjective:  Patient seen resting comfortably in bed.  States she did not sleep well last night and did not fall asleep until 4am. Patient with multiple concerns and questions regarding new scan with PE noted, asking how it started, proposed treatment plan, requesting copy of actual images of scan so that she and family can compare to previous. Patient's daughter also joined on phone. No shortness of breath or acute bleeding noted.  Patient complaints of raised  bumps on both sides of her thigh which she states started yesterday.  No acute distress noted.  Objective:  No intake or output data in the 24 hours ending 06/28/24 1012   PHYSICAL EXAMINATION: ECOG PERFORMANCE STATUS: 1 - Symptomatic but completely ambulatory  Vitals:   06/27/24 2202 06/28/24 0626  BP: 135/78 134/74  Pulse: 77 64  Resp: 16   Temp: 98.7 F (37.1 C) 98.4 F (36.9 C)  SpO2: 99% 98%   Filed Weights   06/24/24 1725  Weight: 169 lb 1.5 oz (76.7 kg)    GENERAL: alert, no distress and comfortable SKIN: skin color, texture, turgor are normal, no rashes or significant lesions EYES: normal, conjunctiva are pink and non-injected, sclera clear OROPHARYNX: no exudate, no erythema and lips, buccal mucosa, and tongue normal  NECK: supple, thyroid  normal size, non-tender, without nodularity LYMPH: no palpable lymphadenopathy in the cervical, axillary or inguinal LUNGS: clear to auscultation and percussion with normal breathing effort HEART: regular rate & rhythm and no murmurs and no lower extremity edema ABDOMEN: +R ostomy MUSCULOSKELETAL: no cyanosis of digits and no clubbing  PSYCH: alert & oriented x 3 with fluent speech NEURO: no focal motor/sensory deficits   All questions were answered. The patient knows to call the clinic with any problems, questions or concerns.   The total time spent in the appointment was 40 minutes encounter with patient including review of chart and various tests results, discussions about plan of care and coordination of care plan  Olam PARAS  Rouson, NP 06/28/2024 10:12 AM    Labs Reviewed:  Lab Results  Component Value Date   WBC 7.8 06/28/2024   HGB 8.9 (L) 06/28/2024   HCT 29.0 (L) 06/28/2024   MCV 85.5 06/28/2024   PLT 236 06/28/2024   Recent Labs    03/06/24 1020 04/19/24 1518 06/23/24 1107 06/24/24 0618 06/25/24 0629 06/26/24 0311 06/27/24 0440 06/28/24 0419  NA 137   < > 138 145 138 136 138 134*  K 3.9   < > 3.2*  4.0 2.9* 3.2* 3.6 3.5  CL 99   < > 102 107 103 106 107 104  CO2 31   < > 26 27 25 23 24 22   GLUCOSE 96   < > 104* 112* 95 121* 91 92  BUN 16   < > 26* 25* 11 21 16 20   CREATININE 1.12   < > 1.06* 0.39* 0.97 0.93 1.05* 1.04*  CALCIUM  9.6   < > 9.4 8.1* 8.8* 8.3* 8.5* 8.5*  GFRNONAA  --    < > 57* >60 >60 >60 58* 58*  PROT 7.3   < > 7.2 5.1* 5.7*  --   --   --   ALBUMIN  4.0   < > 3.2* 1.7* 2.8*  --   --   --   AST 32   < > 20 75* 15  --   --   --   ALT 30   < > 11 70* 10  --   --   --   ALKPHOS 69   < > 79 124 56  --   --   --   BILITOT 0.7   < > 0.6 0.4 0.8  --   --   --   BILIDIR 0.2  --   --   --  <0.1  --   --   --   IBILI  --   --   --   --  NOT CALCULATED  --   --   --    < > = values in this interval not displayed.    Studies Reviewed:  ECHOCARDIOGRAM COMPLETE Result Date: 06/26/2024    ECHOCARDIOGRAM REPORT   Patient Name:   Latasha Wood Date of Exam: 06/26/2024 Medical Rec #:  [id number]           Height:       63.0 in Accession #:    [id number]          Weight:       169.1 lb Date of Birth:  [birth year]            BSA:          1.800 m Patient Age:    68 years            BP:           138/79 mmHg Patient Gender: F                   HR:           63 bpm. Exam Location:  Inpatient Procedure: 2D Echo, Cardiac Doppler and Color Doppler (Both Spectral and Color            Flow Doppler were utilized during procedure). Indications:    pulmonary embolus  History:        Patient has prior history of Echocardiogram examinations, most  recent 04/22/2024.  Sonographer:    Therisa Crouch Referring Phys: [id number] Latasha Wood IMPRESSIONS  1. Left ventricular ejection fraction, by estimation, is 55 to 60%. The left ventricle has normal function. The left ventricle has no regional wall motion abnormalities. There is mild concentric left ventricular hypertrophy. Left ventricular diastolic parameters are consistent with Grade I diastolic dysfunction (impaired relaxation).  2. Right  ventricular systolic function is normal. The right ventricular size is normal. There is normal pulmonary artery systolic pressure. The estimated right ventricular systolic pressure is 21.8 mmHg.  3. The mitral valve is normal in structure. Trivial mitral valve regurgitation. No evidence of mitral stenosis.  4. The aortic valve is tricuspid. Aortic valve regurgitation is not visualized. No aortic stenosis is present.  5. The inferior vena cava is normal in size with greater than 50% respiratory variability, suggesting right atrial pressure of 3 mmHg. FINDINGS  Left Ventricle: Left ventricular ejection fraction, by estimation, is 55 to 60%. The left ventricle has normal function. The left ventricle has no regional wall motion abnormalities. The left ventricular internal cavity size was normal in size. There is  mild concentric left ventricular hypertrophy. Left ventricular diastolic parameters are consistent with Grade I diastolic dysfunction (impaired relaxation). Right Ventricle: The right ventricular size is normal. No increase in right ventricular wall thickness. Right ventricular systolic function is normal. There is normal pulmonary artery systolic pressure. The tricuspid regurgitant velocity is 2.17 m/s, and  with Wood assumed right atrial pressure of 3 mmHg, the estimated right ventricular systolic pressure is 21.8 mmHg. Left Atrium: Left atrial size was normal in size. Right Atrium: Right atrial size was normal in size. Pericardium: There is no evidence of pericardial effusion. Mitral Valve: The mitral valve is normal in structure. Trivial mitral valve regurgitation. No evidence of mitral valve stenosis. Tricuspid Valve: The tricuspid valve is normal in structure. Tricuspid valve regurgitation is trivial. Aortic Valve: The aortic valve is tricuspid. Aortic valve regurgitation is not visualized. No aortic stenosis is present. Pulmonic Valve: The pulmonic valve was normal in structure. Pulmonic valve  regurgitation is not visualized. Aorta: The aortic root is normal in size and structure. Venous: The inferior vena cava is normal in size with greater than 50% respiratory variability, suggesting right atrial pressure of 3 mmHg. IAS/Shunts: No atrial level shunt detected by color flow Doppler.  LEFT VENTRICLE PLAX 2D LVIDd:         3.40 cm   Diastology LVIDs:         2.10 cm   LV e' medial:    10.60 cm/s LV PW:         1.00 cm   LV E/e' medial:  7.4 LV IVS:        1.10 cm   LV e' lateral:   11.90 cm/s LVOT diam:     1.90 cm   LV E/e' lateral: 6.6 LVOT Area:     2.84 cm  RIGHT VENTRICLE             IVC RV Basal diam:  2.80 cm     IVC diam: 1.90 cm RV S prime:     11.40 cm/s TAPSE (M-mode): 2.8 cm LEFT ATRIUM             Index        RIGHT ATRIUM           Index LA diam:        2.20 cm 1.22 cm/m   RA Area:  17.40 cm LA Vol (A2C):   51.6 ml 28.66 ml/m  RA Volume:   42.70 ml  23.72 ml/m LA Vol (A4C):   43.9 ml 24.38 ml/m LA Biplane Vol: 47.8 ml 26.55 ml/m   AORTA Ao Root diam: 3.00 cm Ao Asc diam:  3.60 cm MITRAL VALVE               TRICUSPID VALVE MV Area (PHT): 3.60 cm    TR Peak grad:   18.8 mmHg MV Decel Time: 211 msec    TR Vmax:        217.00 cm/s MV E velocity: 78.00 cm/s MV A velocity: 71.10 cm/s  SHUNTS MV E/A ratio:  1.10        Systemic Diam: 1.90 cm Latasha McleanMD Electronically signed by Ezra Kanner Signature Date/Time: 06/26/2024/9:01:14 PM    Final    DG Abd 1 View Result Date: 06/26/2024 CLINICAL DATA:  Evaluate for foreign body.  Retained capsule. EXAM: ABDOMEN - 1 VIEW COMPARISON:  Abdominal x-ray 06/26/2024. FINDINGS: Previous identified rectangular foreign body overlying the colostomy is no longer seen. Surgical anastomotic staple line is again noted in the pelvis. There is contrast in the bladder and renal collecting systems. A can give a are likely external artifact is overlying the lower right hemipelvis which appears unchanged from prior. External snap artifact overlies the  lateral left abdominal wall. No new unexpected radiopaque foreign body identified. Bowel-gas pattern is nonobstructive. No suspicious calcifications are seen. IMPRESSION: Previous identified rectangular foreign body overlying the colostomy is no longer seen. No new unexpected radiopaque foreign body identified. Electronically Signed   By: Greig Pique M.D.   On: 06/26/2024 19:18   CT Angio Chest Pulmonary Embolism (PE) W or WO Contrast Result Date: 06/26/2024 CLINICAL DATA:  History of bilateral DVTs, on Eliquis  EXAM: CT ANGIOGRAPHY CHEST WITH CONTRAST TECHNIQUE: Multidetector CT imaging of the chest was performed using the standard protocol during bolus administration of intravenous contrast. Multiplanar CT image reconstructions and MIPs were obtained to evaluate the vascular anatomy. RADIATION DOSE REDUCTION: This exam was performed according to the departmental dose-optimization program which includes automated exposure control, adjustment of the mA and/or kV according to patient size and/or use of iterative reconstruction technique. CONTRAST:  75mL OMNIPAQUE  IOHEXOL  350 MG/ML SOLN COMPARISON:  April 20, 2024 FINDINGS: Pulmonary Embolism: Resolution of the acute embolus within the interlobar pulmonary artery in the right lung. Small amount of residual distal segmental/subsegmental embolus in the basilar segments of the right lower lobe. Increasing clot burden within the left upper lobe lingular branch, extending into the interlobar pulmonary artery and into the lobar branch of the left lower lobe. The distal segmental and subsegmental emboli in the left lower lobe on the prior study have resolved. Cardiovascular: No cardiomegaly or pericardial effusion.No aortic aneurysm. Subtle reflux of contrast into the hepatic veins. Mediastinum/Nodes: No mediastinal mass.Similar heterogeneous enlargement of the right thyroid  lobe measuring 4.5 cm in AP dimension.No mediastinal, hilar, or axillary lymphadenopathy.  Lungs/Pleura: The midline trachea and bronchi are patent. Posterior bibasilar dependent atelectasis. No focal airspace consolidation, pleural effusion, or pneumothorax. Musculoskeletal: No acute fracture or destructive bone lesion. Multilevel degenerative disc disease of the spine. Osteopenia. Upper Abdomen: No acute abnormality in the partially visualized upper abdomen. Review of the MIP images confirms the above findings. IMPRESSION: 1. Although there has been interval resolution of some of the pulmonary emboli on the prior study, there are new emboli in the left upper lobe lingular branch and left interlobar  pulmonary arteries. While there is subtle reflux of contrast into the hepatic veins, as can be seen in elevated right heart pressures or right heart strain, the RV to LV ratio remains normal and there are no additional findings of right heart strain. Laboratory correlation recommended. If this remains of clinical concern, a follow-up echocardiogram should be considered. 2. No pneumonia, pulmonary edema, or pleural effusion. 3. Similar appearance of the heterogeneously enlarged right thyroid  lobe. If not previously evaluated, a nonemergent thyroid  ultrasound is recommended for further characterization. Critical Value/emergent results were called by telephone at the time of interpretation on 06/26/2024 at 1501 to provider Dr. Jillian, who verbally acknowledged these results. Electronically Signed   By: Rogelia Myers M.D.   On: 06/26/2024 15:01   DG Abd 1 View Result Date: 06/26/2024 CLINICAL DATA:  [id number] Foreign body alimentary tract, subsequent encounter [phone]. EXAM: ABDOMEN - 1 VIEW COMPARISON:  01/16/2020. FINDINGS: The bowel gas pattern is non-obstructive. Bowel anastomotic suture noted overlying the lower sacrum region. No evidence of pneumoperitoneum, within the limitations of a supine film. No acute osseous abnormalities. The soft tissues are within normal limits. Surgical changes, devices, tubes  and lines: There is a rectangular 1.3 x 2.0 cm opacity overlying the right lower quadrant region superimposed on colostomy ring. It is unclear whether this is external to the patient. Correlate with physical examination. IMPRESSION: Nonobstructive bowel gas pattern. There is a rectangular 1.3 x 2.0 cm opacity overlying the right lower quadrant region superimposed on colostomy ring. It is unclear whether this is external to the patient. Correlate with physical examination. Electronically Signed   By: Ree Molt M.D.   On: 06/26/2024 11:40   VAS US  LOWER EXTREMITY VENOUS (DVT) Result Date: 06/25/2024  Lower Venous DVT Study Patient Name:  DIONDRA PINES  Date of Exam:   06/25/2024 Medical Rec #: [id number]            Accession #:    [id number] Date of Birth: November 05, 1956             Patient Gender: F Patient Age:   15 years Exam Location:  [facility] Procedure:      VAS US  LOWER EXTREMITY VENOUS (DVT) Referring Phys: DORN CHILL --------------------------------------------------------------------------------  Indications: Pulmonary embolism.  Risk Factors: DVT. Limitations: Bandages, poor ultrasound/tissue interface and line. Comparison Study: 04/21/2024 - RIGHT:                   - Findings consistent with acute deep vein thrombosis                   involving the right peroneal veins.                   - No cystic structure found in the popliteal fossa.                    LEFT:                   - Findings consistent with acute, non occlusive DVT noted in                   the left proximal femoral vein and profunda vein.                   - No cystic structure found in the popliteal fossa. Performing Technologist: Cordella COLLET RVT  Examination Guidelines: A complete evaluation includes B-mode imaging, spectral Doppler, color Doppler, and  power Doppler as needed of all accessible portions of each vessel. Bilateral testing is considered Wood integral part of a complete examination. Limited  examinations for reoccurring indications may be performed as noted. The reflux portion of the exam is performed with the patient in reverse Trendelenburg.  +---------+---------------+---------+-----------+----------+--------------+ RIGHT    CompressibilityPhasicitySpontaneityPropertiesThrombus Aging +---------+---------------+---------+-----------+----------+--------------+ CFV      Full           Yes      Yes                                 +---------+---------------+---------+-----------+----------+--------------+ SFJ      Full                                                        +---------+---------------+---------+-----------+----------+--------------+ FV Prox  Full                                                        +---------+---------------+---------+-----------+----------+--------------+ FV Mid   Full                                                        +---------+---------------+---------+-----------+----------+--------------+ FV DistalFull                                                        +---------+---------------+---------+-----------+----------+--------------+ PFV      Full                                                        +---------+---------------+---------+-----------+----------+--------------+ POP      Full           Yes      Yes                                 +---------+---------------+---------+-----------+----------+--------------+ PTV      Full                                                        +---------+---------------+---------+-----------+----------+--------------+ PERO     Full                                                        +---------+---------------+---------+-----------+----------+--------------+   +---------+---------------+---------+-----------+----------+--------------+  LEFT     CompressibilityPhasicitySpontaneityPropertiesThrombus Aging  +---------+---------------+---------+-----------+----------+--------------+ CFV      Full           Yes      Yes                                 +---------+---------------+---------+-----------+----------+--------------+ SFJ      Full                                                        +---------+---------------+---------+-----------+----------+--------------+ FV Prox  Full                                                        +---------+---------------+---------+-----------+----------+--------------+ FV Mid   Full                                                        +---------+---------------+---------+-----------+----------+--------------+ FV DistalFull                                                        +---------+---------------+---------+-----------+----------+--------------+ PFV      Full                                                        +---------+---------------+---------+-----------+----------+--------------+ POP      Full           Yes      Yes                                 +---------+---------------+---------+-----------+----------+--------------+ PTV      Full                                                        +---------+---------------+---------+-----------+----------+--------------+ PERO     Full                                                        +---------+---------------+---------+-----------+----------+--------------+     Summary: RIGHT: - There is no evidence of deep vein thrombosis in the lower extremity.  - No cystic structure found in the popliteal fossa.  LEFT: - There is no evidence of deep vein thrombosis in the lower extremity.  - No  cystic structure found in the popliteal fossa.  *See table(s) above for measurements and observations. Electronically signed by Penne Colorado MD on 06/25/2024 at 11:42:43 AM.    Final    IR Angiogram Visceral Selective Result Date: 06/24/2024 INDICATION: GI bleed.  Question  small bowel extravasation on CTA. EXAM: Title: MESENTERIC ARTERIOGRAM Procedures: 1. ULTRASOUND-GUIDED RIGHT COMMON FEMORAL ARTERIAL ACCESS 2. MESENTERIC ARTERIOGRAPHY, including SMA and SMA BRANCH SELECTIVE ANGIOGRAPHY COMPARISON:  CTA AP, 06/23/2024 and 06/03/2024. MEDICATIONS: None ANESTHESIA/SEDATION: Local anesthetic and single agent sedation was employed during this procedure. A total of fentanyl  100 mcg was administered intravenously. The patient's level of consciousness and vital signs were monitored continuously by radiology nursing throughout the procedure under my direct supervision. CONTRAST:  50 mL Omnipaque  300 FLUOROSCOPY: Radiation Exposure Index and estimated peak skin dose (PSD); Reference air kerma (RAK), 112 mGy. COMPLICATIONS: None immediate. PROCEDURE: Informed consent was obtained from the patient and/or patient's representative following explanation of the procedure, risks, benefits and alternatives. All questions were addressed. A time out was performed prior to the initiation of the procedure. Maximal barrier sterile technique utilized including caps, mask, sterile gowns, sterile gloves, large sterile drape, hand hygiene, and chlorhexidine  prep. The RIGHT femoral head was marked fluoroscopically. Ultrasound-guided access of the RIGHT femoral artery was obtained, allowing placement of a 5 Fr, 35 cm Brite tip vascular sheath. Wood ultrasound image was saved to PACS. A limited arteriogram was performed through the side arm of the sheath confirming appropriate access within the RIGHT common femoral artery. Over a Bentson wire, a Mickelson catheter was advanced the caudal aspect of the thoracic aorta where was reformed, back bled and flushed. The catheter was then utilized to select the superior mesenteric artery and selective arteriogram was performed. Distal SMA branch access was obtained with a 2.4 Fr Progreat alpha microcatheter and 0.016 inch Fathom microwire. Super selective arteriograms  were performed. Images were reviewed and the procedure was terminated. All wires and catheters were removed from the patient. Hemostasis was achieved at the RIGHT groin access site with Angio-Seal closure device. The patient tolerated the procedure well without immediate post procedural complication. FINDINGS: *Access via the RIGHT femoral artery. *No active extravasation of contrast on mesenteric arteriography, either from the SMA or distal SMA branch on selective or super selective angiography. IMPRESSION: 1. Mesenteric arteriography, including SMA and distal SMA branch super selective interrogation. 2. No active contrast extravasation on catheter angiography. No embolization was performed. Thom Hall, MD [facility] Electronically Signed   By: Thom Hall M.D.   On: 06/24/2024 14:27   IR US  Guide Vasc Access Right Result Date: 06/24/2024 INDICATION: GI bleed.  Question small bowel extravasation on CTA. EXAM: Title: MESENTERIC ARTERIOGRAM Procedures: 1. ULTRASOUND-GUIDED RIGHT COMMON FEMORAL ARTERIAL ACCESS 2. MESENTERIC ARTERIOGRAPHY, including SMA and SMA BRANCH SELECTIVE ANGIOGRAPHY COMPARISON:  CTA AP, 06/23/2024 and 06/03/2024. MEDICATIONS: None ANESTHESIA/SEDATION: Local anesthetic and single agent sedation was employed during this procedure. A total of fentanyl  100 mcg was administered intravenously. The patient's level of consciousness and vital signs were monitored continuously by radiology nursing throughout the procedure under my direct supervision. CONTRAST:  50 mL Omnipaque  300 FLUOROSCOPY: Radiation Exposure Index and estimated peak skin dose (PSD); Reference air kerma (RAK), 112 mGy. COMPLICATIONS: None immediate. PROCEDURE: Informed consent was obtained from the patient and/or patient's representative following explanation of the procedure, risks, benefits and alternatives. All questions were addressed. A time out was performed prior  to the initiation of the procedure. Maximal  barrier sterile technique utilized including caps, mask, sterile gowns, sterile gloves, large sterile drape, hand hygiene, and chlorhexidine  prep. The RIGHT femoral head was marked fluoroscopically. Ultrasound-guided access of the RIGHT femoral artery was obtained, allowing placement of a 5 Fr, 35 cm Brite tip vascular sheath. Wood ultrasound image was saved to PACS. A limited arteriogram was performed through the side arm of the sheath confirming appropriate access within the RIGHT common femoral artery. Over a Bentson wire, a Mickelson catheter was advanced the caudal aspect of the thoracic aorta where was reformed, back bled and flushed. The catheter was then utilized to select the superior mesenteric artery and selective arteriogram was performed. Distal SMA branch access was obtained with a 2.4 Fr Progreat alpha microcatheter and 0.016 inch Fathom microwire. Super selective arteriograms were performed. Images were reviewed and the procedure was terminated. All wires and catheters were removed from the patient. Hemostasis was achieved at the RIGHT groin access site with Angio-Seal closure device. The patient tolerated the procedure well without immediate post procedural complication. FINDINGS: *Access via the RIGHT femoral artery. *No active extravasation of contrast on mesenteric arteriography, either from the SMA or distal SMA branch on selective or super selective angiography. IMPRESSION: 1. Mesenteric arteriography, including SMA and distal SMA branch super selective interrogation. 2. No active contrast extravasation on catheter angiography. No embolization was performed. Thom Hall, MD [facility] Radiology Electronically Signed   By: Thom Hall M.D.   On: 06/24/2024 14:27   IR Angiogram Selective Each Additional Vessel Result Date: 06/24/2024 INDICATION: GI bleed.  Question small bowel extravasation on CTA.  EXAM: Title: MESENTERIC ARTERIOGRAM Procedures: 1. ULTRASOUND-GUIDED RIGHT COMMON FEMORAL ARTERIAL ACCESS 2. MESENTERIC ARTERIOGRAPHY, including SMA and SMA BRANCH SELECTIVE ANGIOGRAPHY COMPARISON:  CTA AP, 06/23/2024 and 06/03/2024. MEDICATIONS: None ANESTHESIA/SEDATION: Local anesthetic and single agent sedation was employed during this procedure. A total of fentanyl  100 mcg was administered intravenously. The patient's level of consciousness and vital signs were monitored continuously by radiology nursing throughout the procedure under my direct supervision. CONTRAST:  50 mL Omnipaque  300 FLUOROSCOPY: Radiation Exposure Index and estimated peak skin dose (PSD); Reference air kerma (RAK), 112 mGy. COMPLICATIONS: None immediate. PROCEDURE: Informed consent was obtained from the patient and/or patient's representative following explanation of the procedure, risks, benefits and alternatives. All questions were addressed. A time out was performed prior to the initiation of the procedure. Maximal barrier sterile technique utilized including caps, mask, sterile gowns, sterile gloves, large sterile drape, hand hygiene, and chlorhexidine  prep. The RIGHT femoral head was marked fluoroscopically. Ultrasound-guided access of the RIGHT femoral artery was obtained, allowing placement of a 5 Fr, 35 cm Brite tip vascular sheath. Wood ultrasound image was saved to PACS. A limited arteriogram was performed through the side arm of the sheath confirming appropriate access within the RIGHT common femoral artery. Over a Bentson wire, a Mickelson catheter was advanced the caudal aspect of the thoracic aorta where was reformed, back bled and flushed. The catheter was then utilized to select the superior mesenteric artery and selective arteriogram was performed. Distal SMA branch access was obtained with a 2.4 Fr Progreat alpha microcatheter and 0.016 inch Fathom microwire. Super selective arteriograms were performed. Images were reviewed  and the procedure was terminated. All wires and catheters were removed from the patient. Hemostasis was achieved at the RIGHT groin access site with Angio-Seal closure device. The patient tolerated the procedure well without immediate post procedural complication. FINDINGS: *Access via the RIGHT femoral artery. *No  active extravasation of contrast on mesenteric arteriography, either from the SMA or distal SMA branch on selective or super selective angiography. IMPRESSION: 1. Mesenteric arteriography, including SMA and distal SMA branch super selective interrogation. 2. No active contrast extravasation on catheter angiography. No embolization was performed. Thom Hall, MD [facility] Radiology Electronically Signed   By: Thom Hall M.D.   On: 06/24/2024 14:27   IR Angiogram Selective Each Additional Vessel Result Date: 06/24/2024 INDICATION: GI bleed.  Question small bowel extravasation on CTA. EXAM: Title: MESENTERIC ARTERIOGRAM Procedures: 1. ULTRASOUND-GUIDED RIGHT COMMON FEMORAL ARTERIAL ACCESS 2. MESENTERIC ARTERIOGRAPHY, including SMA and SMA BRANCH SELECTIVE ANGIOGRAPHY COMPARISON:  CTA AP, 06/23/2024 and 06/03/2024. MEDICATIONS: None ANESTHESIA/SEDATION: Local anesthetic and single agent sedation was employed during this procedure. A total of fentanyl  100 mcg was administered intravenously. The patient's level of consciousness and vital signs were monitored continuously by radiology nursing throughout the procedure under my direct supervision. CONTRAST:  50 mL Omnipaque  300 FLUOROSCOPY: Radiation Exposure Index and estimated peak skin dose (PSD); Reference air kerma (RAK), 112 mGy. COMPLICATIONS: None immediate. PROCEDURE: Informed consent was obtained from the patient and/or patient's representative following explanation of the procedure, risks, benefits and alternatives. All questions were addressed. A time out was performed prior to the initiation of  the procedure. Maximal barrier sterile technique utilized including caps, mask, sterile gowns, sterile gloves, large sterile drape, hand hygiene, and chlorhexidine  prep. The RIGHT femoral head was marked fluoroscopically. Ultrasound-guided access of the RIGHT femoral artery was obtained, allowing placement of a 5 Fr, 35 cm Brite tip vascular sheath. Wood ultrasound image was saved to PACS. A limited arteriogram was performed through the side arm of the sheath confirming appropriate access within the RIGHT common femoral artery. Over a Bentson wire, a Mickelson catheter was advanced the caudal aspect of the thoracic aorta where was reformed, back bled and flushed. The catheter was then utilized to select the superior mesenteric artery and selective arteriogram was performed. Distal SMA branch access was obtained with a 2.4 Fr Progreat alpha microcatheter and 0.016 inch Fathom microwire. Super selective arteriograms were performed. Images were reviewed and the procedure was terminated. All wires and catheters were removed from the patient. Hemostasis was achieved at the RIGHT groin access site with Angio-Seal closure device. The patient tolerated the procedure well without immediate post procedural complication. FINDINGS: *Access via the RIGHT femoral artery. *No active extravasation of contrast on mesenteric arteriography, either from the SMA or distal SMA branch on selective or super selective angiography. IMPRESSION: 1. Mesenteric arteriography, including SMA and distal SMA branch super selective interrogation. 2. No active contrast extravasation on catheter angiography. No embolization was performed. Thom Hall, MD [facility] Radiology Electronically Signed   By: Thom Hall M.D.   On: 06/24/2024 14:27   CT Angio Abd/Pel W and/or Wo Contrast Result Date: 06/23/2024 CLINICAL DATA:  Lower gastrointestinal bleeding. EXAM: CTA ABDOMEN AND PELVIS WITHOUT AND WITH  CONTRAST TECHNIQUE: Multidetector CT imaging of the abdomen and pelvis was performed using the standard protocol during bolus administration of intravenous contrast. Multiplanar reconstructed images and MIPs were obtained and reviewed to evaluate the vascular anatomy. RADIATION DOSE REDUCTION: This exam was performed according to the departmental dose-optimization program which includes automated exposure control, adjustment of the mA and/or kV according to patient size and/or use of iterative reconstruction technique. CONTRAST:  80mL OMNIPAQUE  IOHEXOL  350 MG/ML SOLN COMPARISON:  January 15, 2020. FINDINGS: VASCULAR Aorta: Atherosclerosis of abdominal aorta  without aneurysm or dissection. Celiac: Patent without evidence of aneurysm, dissection, vasculitis or significant stenosis. SMA: Patent without evidence of aneurysm, dissection, vasculitis or significant stenosis. Renals: Both renal arteries are patent without evidence of aneurysm, dissection, vasculitis, fibromuscular dysplasia or significant stenosis. IMA: Not definitively visualized. Inflow: Patent without evidence of aneurysm, dissection, vasculitis or significant stenosis. Proximal Outflow: Bilateral common femoral and visualized portions of the superficial and profunda femoral arteries are patent without evidence of aneurysm, dissection, vasculitis or significant stenosis. Veins: No obvious venous abnormality within the limitations of this arterial phase study. Review of the MIP images confirms the above findings. NON-VASCULAR Lower chest: No acute abnormality. Hepatobiliary: No focal liver abnormality is seen. No gallstones, gallbladder wall thickening, or biliary dilatation. Pancreas: Unremarkable. No pancreatic ductal dilatation or surrounding inflammatory changes. Spleen: Normal in size without focal abnormality. Adrenals/Urinary Tract: Adrenal glands appear normal. Right renal cyst is noted. No hydronephrosis or renal obstruction is noted. Urinary  bladder is unremarkable. Stomach/Bowel: The stomach is unremarkable. Status post total colectomy. Ileostomy is noted in right lower quadrant. There is no evidence of bowel obstruction or inflammation. Dilated segment of small bowel is seen in area of surgical anastomosis. There does appear to be accumulation of high density material in this area on later images suggesting possible small focus of gastrointestinal bleeding. This is best seen on image number 58 of series 18. Lymphatic: No adenopathy is noted. Reproductive: Status post hysterectomy. No adnexal masses. Other: Small infraumbilical ventral hernia is noted which contains a loop of small bowel, but does not result in obstruction. No ascites. Musculoskeletal: No acute or significant osseous findings. IMPRESSION: VASCULAR Probable accumulation of high density material seen in dilated segment of small bowel in pelvis near surgical anastomosis. This is concerning for possible focus of gastrointestinal bleeding. Critical Value/emergent results were called by telephone at the time of interpretation on 06/23/2024 at 12:57 pm to provider [facility] , who verbally acknowledged these results. Aortic Atherosclerosis (ICD10-I70.0). 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 Small infraumbilical ventral hernia is noted. Electronically Signed   By: Lynwood Landy Raddle M.D.   On: 06/23/2024 12:57   I have read the above note and personally examined the patient. I agree with the assessment and plan as noted above.  Briefly Ms. 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  Unfortunately no clear etiology of the GI bleed has been found and therefore the decision has been made to have Wood IVC filter placed.  We discussed today the risks and benefits of IVC filter placement, including our intention to have it in  place for less than 6 months.  Additionally we talked about restarting anticoagulation therapy once her hemoglobin has stabilized.  Patient has not seen any overt sign of bleeding.  She voiced her understanding of the risks and benefits and the challenges of her case.  She unfortunately does have active clot in her chest which could expand and worsen off anticoagulation, however with her bleeding it is unsafe to pursue anticoagulation therapy at this time.  Additionally the IVC filter will provide benefit initially for preventing clot burden from the legs from reaching the lungs.  The patient voiced understanding of our findings and recommendation moving forward.  Will plan to see her back in approximately 2 to 4 weeks after her discharge from the hospital.   Norleen IVAR Kidney,  MD Department of Hematology/Oncology [facility] Phone: [phone] Pager: [phone] Email: [email or web address]

## 2024-06-28 NOTE — Progress Notes (Signed)
 Pt woke up around 4ish with red dots on her both of her thighs. On call provider contacted. See Mar

## 2024-06-28 NOTE — Plan of Care (Signed)

## 2024-06-28 NOTE — Plan of Care (Signed)

## 2024-06-28 NOTE — Progress Notes (Signed)
 [facility] Progress Note  CC:   GI bleed   Subjective:  Did not sleep well last night so she is tired.  Going to IR for IVC filter today.  No red blood in her ostomy bag, but stools are a little bit darker than they were yesterday.   Objective:  Vital signs in last 24 hours: Temp:  [98.4 F (36.9 C)-98.7 F (37.1 C)] 98.4 F (36.9 C) (08/20 0626) Pulse Rate:  [64-77] 64 (08/20 0626) Resp:  [16-19] 16 (08/19 2202) BP: (134-138)/(74-80) 134/74 (08/20 0626) SpO2:  [98 %-100 %] 98 % (08/20 0626) Last BM Date : 06/27/24 General:  Alert, Well-developed, in NAD Heart:  Regular rate and rhythm; no murmurs Pulm:  CTAB.  No W/R/R. Abdomen:  Soft, non-distended.  BS present.  Non-tender.  Dark brown stool in ostomy bag. Extremities:  Without edema. Neurologic:  Alert and oriented x 4;  grossly normal neurologically. Psych:  Alert and cooperative. Normal mood and affect.  Lab Results: Recent Labs    06/26/24 0311 06/27/24 0440 06/28/24 0419  WBC 9.6 8.2 7.8  HGB 9.6* 9.5* 8.9*  HCT 29.7* 30.0* 29.0*  PLT 222 242 236   BMET Recent Labs    06/26/24 0311 06/27/24 0440 06/28/24 0419  NA 136 138 134*  K 3.2* 3.6 3.5  CL 106 107 104  CO2 23 24 22   GLUCOSE 121* 91 92  BUN 21 16 20   CREATININE 0.93 1.05* 1.04*  CALCIUM  8.3* 8.5* 8.5*    PT/INR Recent Labs    06/28/24 0419  LABPROT 13.6  INR 1.0   ECHOCARDIOGRAM COMPLETE Result Date: 06/26/2024    ECHOCARDIOGRAM REPORT   Patient Name:   MOZELLE REMLINGER Date of Exam: 06/26/2024 Medical Rec #:  [id number]           Height:       63.0 in Accession #:    [id number]          Weight:       169.1 lb Date of Birth:  [birth year]            BSA:          1.800 m Patient Age:    68 years            BP:           138/79 mmHg Patient Gender: F                   HR:           63 bpm. Exam Location:  Inpatient Procedure: 2D Echo, Cardiac Doppler and Color Doppler (Both Spectral and Color            Flow Doppler were  utilized during procedure). Indications:    pulmonary embolus  History:        Patient has prior history of Echocardiogram examinations, most                 recent 04/22/2024.  Sonographer:    Therisa Crouch Referring Phys: [id number] AMRIT ADHIKARI IMPRESSIONS  1. Left ventricular ejection fraction, by estimation, is 55 to 60%. The left ventricle has normal function. The left ventricle has no regional wall motion abnormalities. There is mild concentric left ventricular hypertrophy. Left ventricular diastolic parameters are consistent with Grade I diastolic dysfunction (impaired relaxation).  2. Right ventricular systolic function is normal. The right ventricular size is normal. There is normal pulmonary artery systolic pressure. The estimated  right ventricular systolic pressure is 21.8 mmHg.  3. The mitral valve is normal in structure. Trivial mitral valve regurgitation. No evidence of mitral stenosis.  4. The aortic valve is tricuspid. Aortic valve regurgitation is not visualized. No aortic stenosis is present.  5. The inferior vena cava is normal in size with greater than 50% respiratory variability, suggesting right atrial pressure of 3 mmHg. FINDINGS  Left Ventricle: Left ventricular ejection fraction, by estimation, is 55 to 60%. The left ventricle has normal function. The left ventricle has no regional wall motion abnormalities. The left ventricular internal cavity size was normal in size. There is  mild concentric left ventricular hypertrophy. Left ventricular diastolic parameters are consistent with Grade I diastolic dysfunction (impaired relaxation). Right Ventricle: The right ventricular size is normal. No increase in right ventricular wall thickness. Right ventricular systolic function is normal. There is normal pulmonary artery systolic pressure. The tricuspid regurgitant velocity is 2.17 m/s, and  with an assumed right atrial pressure of 3 mmHg, the estimated right ventricular systolic pressure is 21.8  mmHg. Left Atrium: Left atrial size was normal in size. Right Atrium: Right atrial size was normal in size. Pericardium: There is no evidence of pericardial effusion. Mitral Valve: The mitral valve is normal in structure. Trivial mitral valve regurgitation. No evidence of mitral valve stenosis. Tricuspid Valve: The tricuspid valve is normal in structure. Tricuspid valve regurgitation is trivial. Aortic Valve: The aortic valve is tricuspid. Aortic valve regurgitation is not visualized. No aortic stenosis is present. Pulmonic Valve: The pulmonic valve was normal in structure. Pulmonic valve regurgitation is not visualized. Aorta: The aortic root is normal in size and structure. Venous: The inferior vena cava is normal in size with greater than 50% respiratory variability, suggesting right atrial pressure of 3 mmHg. IAS/Shunts: No atrial level shunt detected by color flow Doppler.  LEFT VENTRICLE PLAX 2D LVIDd:         3.40 cm   Diastology LVIDs:         2.10 cm   LV e' medial:    10.60 cm/s LV PW:         1.00 cm   LV E/e' medial:  7.4 LV IVS:        1.10 cm   LV e' lateral:   11.90 cm/s LVOT diam:     1.90 cm   LV E/e' lateral: 6.6 LVOT Area:     2.84 cm  RIGHT VENTRICLE             IVC RV Basal diam:  2.80 cm     IVC diam: 1.90 cm RV S prime:     11.40 cm/s TAPSE (M-mode): 2.8 cm LEFT ATRIUM             Index        RIGHT ATRIUM           Index LA diam:        2.20 cm 1.22 cm/m   RA Area:     17.40 cm LA Vol (A2C):   51.6 ml 28.66 ml/m  RA Volume:   42.70 ml  23.72 ml/m LA Vol (A4C):   43.9 ml 24.38 ml/m LA Biplane Vol: 47.8 ml 26.55 ml/m   AORTA Ao Root diam: 3.00 cm Ao Asc diam:  3.60 cm MITRAL VALVE               TRICUSPID VALVE MV Area (PHT): 3.60 cm    TR Peak grad:   18.8 mmHg  MV Decel Time: 211 msec    TR Vmax:        217.00 cm/s MV E velocity: 78.00 cm/s MV A velocity: 71.10 cm/s  SHUNTS MV E/A ratio:  1.10        Systemic Diam: 1.90 cm Dalton McleanMD Electronically signed by Ezra Kanner  Signature Date/Time: 06/26/2024/9:01:14 PM    Final    DG Abd 1 View Result Date: 06/26/2024 CLINICAL DATA:  Evaluate for foreign body.  Retained capsule. EXAM: ABDOMEN - 1 VIEW COMPARISON:  Abdominal x-ray 06/26/2024. FINDINGS: Previous identified rectangular foreign body overlying the colostomy is no longer seen. Surgical anastomotic staple line is again noted in the pelvis. There is contrast in the bladder and renal collecting systems. A can give a are likely external artifact is overlying the lower right hemipelvis which appears unchanged from prior. External snap artifact overlies the lateral left abdominal wall. No new unexpected radiopaque foreign body identified. Bowel-gas pattern is nonobstructive. No suspicious calcifications are seen. IMPRESSION: Previous identified rectangular foreign body overlying the colostomy is no longer seen. No new unexpected radiopaque foreign body identified. Electronically Signed   By: Greig Pique M.D.   On: 06/26/2024 19:18   CT Angio Chest Pulmonary Embolism (PE) W or WO Contrast Result Date: 06/26/2024 CLINICAL DATA:  History of bilateral DVTs, on Eliquis  EXAM: CT ANGIOGRAPHY CHEST WITH CONTRAST TECHNIQUE: Multidetector CT imaging of the chest was performed using the standard protocol during bolus administration of intravenous contrast. Multiplanar CT image reconstructions and MIPs were obtained to evaluate the vascular anatomy. RADIATION DOSE REDUCTION: This exam was performed according to the departmental dose-optimization program which includes automated exposure control, adjustment of the mA and/or kV according to patient size and/or use of iterative reconstruction technique. CONTRAST:  75mL OMNIPAQUE  IOHEXOL  350 MG/ML SOLN COMPARISON:  April 20, 2024 FINDINGS: Pulmonary Embolism: Resolution of the acute embolus within the interlobar pulmonary artery in the right lung. Small amount of residual distal segmental/subsegmental embolus in the basilar segments of the  right lower lobe. Increasing clot burden within the left upper lobe lingular branch, extending into the interlobar pulmonary artery and into the lobar branch of the left lower lobe. The distal segmental and subsegmental emboli in the left lower lobe on the prior study have resolved. Cardiovascular: No cardiomegaly or pericardial effusion.No aortic aneurysm. Subtle reflux of contrast into the hepatic veins. Mediastinum/Nodes: No mediastinal mass.Similar heterogeneous enlargement of the right thyroid  lobe measuring 4.5 cm in AP dimension.No mediastinal, hilar, or axillary lymphadenopathy. Lungs/Pleura: The midline trachea and bronchi are patent. Posterior bibasilar dependent atelectasis. No focal airspace consolidation, pleural effusion, or pneumothorax. Musculoskeletal: No acute fracture or destructive bone lesion. Multilevel degenerative disc disease of the spine. Osteopenia. Upper Abdomen: No acute abnormality in the partially visualized upper abdomen. Review of the MIP images confirms the above findings. IMPRESSION: 1. Although there has been interval resolution of some of the pulmonary emboli on the prior study, there are new emboli in the left upper lobe lingular branch and left interlobar pulmonary arteries. While there is subtle reflux of contrast into the hepatic veins, as can be seen in elevated right heart pressures or right heart strain, the RV to LV ratio remains normal and there are no additional findings of right heart strain. Laboratory correlation recommended. If this remains of clinical concern, a follow-up echocardiogram should be considered. 2. No pneumonia, pulmonary edema, or pleural effusion. 3. Similar appearance of the heterogeneously enlarged right thyroid  lobe. If not previously evaluated, a nonemergent thyroid  ultrasound  is recommended for further characterization. Critical Value/emergent results were called by telephone at the time of interpretation on 06/26/2024 at 1501 to provider Dr.  Jillian, who verbally acknowledged these results. Electronically Signed   By: Rogelia Myers M.D.   On: 06/26/2024 15:01   DG Abd 1 View Result Date: 06/26/2024 CLINICAL DATA:  [id number] Foreign body alimentary tract, subsequent encounter [phone]. EXAM: ABDOMEN - 1 VIEW COMPARISON:  01/16/2020. FINDINGS: The bowel gas pattern is non-obstructive. Bowel anastomotic suture noted overlying the lower sacrum region. No evidence of pneumoperitoneum, within the limitations of a supine film. No acute osseous abnormalities. The soft tissues are within normal limits. Surgical changes, devices, tubes and lines: There is a rectangular 1.3 x 2.0 cm opacity overlying the right lower quadrant region superimposed on colostomy ring. It is unclear whether this is external to the patient. Correlate with physical examination. IMPRESSION: Nonobstructive bowel gas pattern. There is a rectangular 1.3 x 2.0 cm opacity overlying the right lower quadrant region superimposed on colostomy ring. It is unclear whether this is external to the patient. Correlate with physical examination. Electronically Signed   By: Ree Molt M.D.   On: 06/26/2024 11:40    Assessment / Plan: 68 year old female with history of ulcerative colitis status post total proctocolectomy complicated by ostomy dysfunction in 2016-2017, with recent diagnosis of bilateral pulmonary emboli/DVT in June this year, followed by hospitalization for GI bleed of unclear etiology (negative EGD), ongoing issues with iron  deficiency anemia, with normal ileoscopy as outpatient on August 14, followed by second admission for bleeding August 15.  CTA with suspected bleeding source in the small intestine.  Patient experienced transient hemorrhagic shock evening of August 15, requiring urgent transfusion, volume resuscitation and reversal of anticoagulation with Kcentra , TXA and vitamin K.  Patient was taken to IR suite for mesenteric angiogram, which was negative for active  extravasation.  Repeat ileoscopy on 8/17 also with no source of bleeding found.  Patency capsule ingested on 8/17.   GI bleeding, unclear etiology: Hemoglobin stable down slightly at 8.9 g today, stool may be a little darker today, no red blood.  Unfortunately patency capsule did not pass/was retained.  High risk of VCE being retained.   PE/DVT:  Appears to have new pulmonary emboli on CTA from yesterday.  ? IVC filter.   -Appreciate surgery's input. -Going to IR for IVC filter. -If she re-bleeds then recommend repeat CTA with re-attempt at IR embollization.    LOS: 5 days   Harlene BIRCH. Shawnice Tilmon  06/28/2024, 9:29 AM

## 2024-06-28 NOTE — Progress Notes (Signed)
 PROGRESS NOTE  Latasha Wood  FMW:[id number] DOB: [birth year] DOA: 06/23/2024 PCP: Joshua Debby CROME, MD   Brief Narrative: Patient is a 68 year old female with history of ulcerative pancolitis status post colectomy, postoperative fistulas, small bowel obstruction, high ostomy output, PE/DVT on Eliquis  who presented from home with complaint of bloody output from ileostomy.  She was admitted on June 24 for the same problem.  Report of multiple episodes of bloody output in the ileostomy bag, dizziness.  On presentation hemoglobin was 10.2.  CT abdomen/pelvis showed accumulation of high density material and dilated segment of small bowel near the surgical anastomotic site.  Suspected GI bleed.  GI consulted.  After admission, she became unresponsive and hypotensive.  Patient transferred to ICU service, given blood transfusion.  Currently hemoglobin stable, she is hemodynamically stable.  Transferred back to [facility] service on 8/18.  GI/oncology consulted and following.  Plan for IVC filter placement  Assessment & Plan:  Principal Problem:   GI bleed Active Problems:   Hemorrhagic shock (HCC)   Stage 3a chronic kidney disease (HCC)   History of total colectomy   Chronic anticoagulation   History of pulmonary embolism   Abnormal computed tomography angiography (CTA) of abdomen   Acute blood loss anemia   Foreign body in small intestine   Deep vein thrombosis (DVT) of proximal lower extremity (HCC)   Small bowel bleed not requiring more than 4 units of blood in 24 hours, ICU, or surgery  Hemorrhagic shock /syncope secondary recurrent upper GI bleed in the setting of anticoagulation: Presented with complaint of bloody output from ileostomy.  Report of multiple episodes of bloody output in the ileostomy bag, dizziness.  Became hypotensive unresponsive during this hospitalization and had to be transferred to ICU.  Given 2 units of PRBC.  Given Kcentra , tranexamic acid . CT angiogram on 8/15 showed  accumulation of high density material seen in dilated segment of small bowel in pelvis near surgical anastomosis, concerning for possible focus of gastrointestinal bleeding. IR was consulted for arterial embolization on 8/15 and underwent mesenteric aortography but no source was located.  GI following.  Ileoscopy did not show any bleeding.   Currently hemoglobin is stable.  Currently on PPI, continue the same.  We also consulted general surgery for any surgical approach for the management of GI bleed as seen on the anastomotic area.  General surgery declined for any intervention. Hemoglobin in the range of 8.9 today.  Will do CT angiogram if she bleeds her hemoglobin dropped significantly  Acute blood loss secondary to upper GI bleed: Given 2 units of transfusion during this hospitalization.  Currently hemoglobin stable in the range of 8-9.  Continue to monitor.Given IV iron  during this hospitalization  CKD stage IIIa: Currently kidney function at baseline.  History of DVT/PE: Was taking Eliquis  at home.  She was previously found to have DVT, acute bilateral PE without evidence of heart strain.  Currently Eliquis  on hold.  She follows with Dr Federico who is following her CT angiogram done here showed new PE on the left, no significant right heart strain, follow-up echo.  Currently anticoagulation on hold.  IR planning to put IVC filter today.  She will follow-up with Dr. Federico and will be started anticoagulation at some point as an outpatient  Hypokalemia: Currently being monitored and supplemented as needed.  History of high output from ileostomy  History of ulcerative colitis/ileostomy: Follows with GI  History of thyroid  mass: Should be follow-up as an outpatient  DVT prophylaxis:Place and maintain sequential compression device Start: 06/27/24 1306None     Code Status: Full Code  Family Communication: Discussed with daughter at bedside on 8/19  Patient  status:Inpatient  Patient is from :Home  Anticipated discharge un:ynfz  Estimated DC date: After full workup   Consultants: GI, PCCM, radiology  Procedures: Mesenteric arteriography  Antimicrobials:  Anti-infectives (From admission, onward)    None       Subjective:  Patient seen and examined at bedside today.  Comfortable.  No report of bloody stools throughout the ileostomy.  Hemoglobin in the range of 8.9 today.  Denies any abdomen pain, nausea or vomiting.  On room air.  No chest pain or shortness of breath at rest  Objective: Vitals:   06/27/24 0441 06/27/24 1314 06/27/24 2202 06/28/24 0626  BP: 111/67 138/80 135/78 134/74  Pulse: 68 70 77 64  Resp: 18 19 16    Temp: 98.2 F (36.8 C) 98.7 F (37.1 C) 98.7 F (37.1 C) 98.4 F (36.9 C)  TempSrc: Oral Oral Oral Oral  SpO2: 99% 100% 99% 98%  Weight:      Height:       No intake or output data in the 24 hours ending 06/28/24 1137  Filed Weights   06/24/24 1725  Weight: 76.7 kg    Examination:   General exam: Overall comfortable, not in distress HEENT: PERRL Respiratory system:  no wheezes or crackles  Cardiovascular system: S1 & S2 heard, RRR.  Gastrointestinal system: Abdomen is nondistended, soft and nontender.  Ileostomy Central nervous system: Alert and oriented Extremities: No edema, no clubbing ,no cyanosis Skin: No rashes, no ulcers,no icterus        Data Reviewed: I have personally reviewed following labs and imaging studies  CBC: Recent Labs  Lab 06/24/24 1826 06/25/24 0629 06/26/24 0311 06/27/24 0440 06/28/24 0419  WBC 8.8 8.0 9.6 8.2 7.8  NEUTROABS 5.9 5.7 6.7 5.4 5.0  HGB 9.7* 9.8* 9.6* 9.5* 8.9*  HCT 29.5* 29.8* 29.7* 30.0* 29.0*  MCV 83.3 83.2 84.6 84.7 85.5  PLT 229 215 222 242 236   Basic Metabolic Panel: Recent Labs  Lab 06/24/24 0618 06/25/24 0629 06/26/24 0311 06/27/24 0440 06/28/24 0419  NA 145 138 136 138 134*  K 4.0 2.9* 3.2* 3.6 3.5  CL 107 103 106 107 104   CO2 27 25 23 24 22   GLUCOSE 112* 95 121* 91 92  BUN 25* 11 21 16 20   CREATININE 0.39* 0.97 0.93 1.05* 1.04*  CALCIUM  8.1* 8.8* 8.3* 8.5* 8.5*  MG 1.9 1.8 1.7  --   --   PHOS  --  3.7 3.2  --   --      No results found for this or any previous visit (from the past 240 hours).   Radiology Studies: ECHOCARDIOGRAM COMPLETE Result Date: 06/26/2024    ECHOCARDIOGRAM REPORT   Patient Name:   MICALAH CABEZAS Date of Exam: 06/26/2024 Medical Rec #:  [id number]           Height:       63.0 in Accession #:    [id number]          Weight:       169.1 lb Date of Birth:  06-16-1956            BSA:          1.800 m Patient Age:    68 years            BP:  138/79 mmHg Patient Gender: F                   HR:           63 bpm. Exam Location:  Inpatient Procedure: 2D Echo, Cardiac Doppler and Color Doppler (Both Spectral and Color            Flow Doppler were utilized during procedure). Indications:    pulmonary embolus  History:        Patient has prior history of Echocardiogram examinations, most                 recent 04/22/2024.  Sonographer:    Therisa Crouch Referring Phys: [id number] Lavene Penagos IMPRESSIONS  1. Left ventricular ejection fraction, by estimation, is 55 to 60%. The left ventricle has normal function. The left ventricle has no regional wall motion abnormalities. There is mild concentric left ventricular hypertrophy. Left ventricular diastolic parameters are consistent with Grade I diastolic dysfunction (impaired relaxation).  2. Right ventricular systolic function is normal. The right ventricular size is normal. There is normal pulmonary artery systolic pressure. The estimated right ventricular systolic pressure is 21.8 mmHg.  3. The mitral valve is normal in structure. Trivial mitral valve regurgitation. No evidence of mitral stenosis.  4. The aortic valve is tricuspid. Aortic valve regurgitation is not visualized. No aortic stenosis is present.  5. The inferior vena cava is normal in size  with greater than 50% respiratory variability, suggesting right atrial pressure of 3 mmHg. FINDINGS  Left Ventricle: Left ventricular ejection fraction, by estimation, is 55 to 60%. The left ventricle has normal function. The left ventricle has no regional wall motion abnormalities. The left ventricular internal cavity size was normal in size. There is  mild concentric left ventricular hypertrophy. Left ventricular diastolic parameters are consistent with Grade I diastolic dysfunction (impaired relaxation). Right Ventricle: The right ventricular size is normal. No increase in right ventricular wall thickness. Right ventricular systolic function is normal. There is normal pulmonary artery systolic pressure. The tricuspid regurgitant velocity is 2.17 m/s, and  with an assumed right atrial pressure of 3 mmHg, the estimated right ventricular systolic pressure is 21.8 mmHg. Left Atrium: Left atrial size was normal in size. Right Atrium: Right atrial size was normal in size. Pericardium: There is no evidence of pericardial effusion. Mitral Valve: The mitral valve is normal in structure. Trivial mitral valve regurgitation. No evidence of mitral valve stenosis. Tricuspid Valve: The tricuspid valve is normal in structure. Tricuspid valve regurgitation is trivial. Aortic Valve: The aortic valve is tricuspid. Aortic valve regurgitation is not visualized. No aortic stenosis is present. Pulmonic Valve: The pulmonic valve was normal in structure. Pulmonic valve regurgitation is not visualized. Aorta: The aortic root is normal in size and structure. Venous: The inferior vena cava is normal in size with greater than 50% respiratory variability, suggesting right atrial pressure of 3 mmHg. IAS/Shunts: No atrial level shunt detected by color flow Doppler.  LEFT VENTRICLE PLAX 2D LVIDd:         3.40 cm   Diastology LVIDs:         2.10 cm   LV e' medial:    10.60 cm/s LV PW:         1.00 cm   LV E/e' medial:  7.4 LV IVS:        1.10 cm    LV e' lateral:   11.90 cm/s LVOT diam:     1.90 cm   LV  E/e' lateral: 6.6 LVOT Area:     2.84 cm  RIGHT VENTRICLE             IVC RV Basal diam:  2.80 cm     IVC diam: 1.90 cm RV S prime:     11.40 cm/s TAPSE (M-mode): 2.8 cm LEFT ATRIUM             Index        RIGHT ATRIUM           Index LA diam:        2.20 cm 1.22 cm/m   RA Area:     17.40 cm LA Vol (A2C):   51.6 ml 28.66 ml/m  RA Volume:   42.70 ml  23.72 ml/m LA Vol (A4C):   43.9 ml 24.38 ml/m LA Biplane Vol: 47.8 ml 26.55 ml/m   AORTA Ao Root diam: 3.00 cm Ao Asc diam:  3.60 cm MITRAL VALVE               TRICUSPID VALVE MV Area (PHT): 3.60 cm    TR Peak grad:   18.8 mmHg MV Decel Time: 211 msec    TR Vmax:        217.00 cm/s MV E velocity: 78.00 cm/s MV A velocity: 71.10 cm/s  SHUNTS MV E/A ratio:  1.10        Systemic Diam: 1.90 cm Dalton McleanMD Electronically signed by Ezra Kanner Signature Date/Time: 06/26/2024/9:01:14 PM    Final    DG Abd 1 View Result Date: 06/26/2024 CLINICAL DATA:  Evaluate for foreign body.  Retained capsule. EXAM: ABDOMEN - 1 VIEW COMPARISON:  Abdominal x-ray 06/26/2024. FINDINGS: Previous identified rectangular foreign body overlying the colostomy is no longer seen. Surgical anastomotic staple line is again noted in the pelvis. There is contrast in the bladder and renal collecting systems. A can give a are likely external artifact is overlying the lower right hemipelvis which appears unchanged from prior. External snap artifact overlies the lateral left abdominal wall. No new unexpected radiopaque foreign body identified. Bowel-gas pattern is nonobstructive. No suspicious calcifications are seen. IMPRESSION: Previous identified rectangular foreign body overlying the colostomy is no longer seen. No new unexpected radiopaque foreign body identified. Electronically Signed   By: Greig Pique M.D.   On: 06/26/2024 19:18   CT Angio Chest Pulmonary Embolism (PE) W or WO Contrast Result Date: 06/26/2024 CLINICAL DATA:   History of bilateral DVTs, on Eliquis  EXAM: CT ANGIOGRAPHY CHEST WITH CONTRAST TECHNIQUE: Multidetector CT imaging of the chest was performed using the standard protocol during bolus administration of intravenous contrast. Multiplanar CT image reconstructions and MIPs were obtained to evaluate the vascular anatomy. RADIATION DOSE REDUCTION: This exam was performed according to the departmental dose-optimization program which includes automated exposure control, adjustment of the mA and/or kV according to patient size and/or use of iterative reconstruction technique. CONTRAST:  75mL OMNIPAQUE  IOHEXOL  350 MG/ML SOLN COMPARISON:  April 20, 2024 FINDINGS: Pulmonary Embolism: Resolution of the acute embolus within the interlobar pulmonary artery in the right lung. Small amount of residual distal segmental/subsegmental embolus in the basilar segments of the right lower lobe. Increasing clot burden within the left upper lobe lingular branch, extending into the interlobar pulmonary artery and into the lobar branch of the left lower lobe. The distal segmental and subsegmental emboli in the left lower lobe on the prior study have resolved. Cardiovascular: No cardiomegaly or pericardial effusion.No aortic aneurysm. Subtle reflux of contrast into the hepatic veins. Mediastinum/Nodes:  No mediastinal mass.Similar heterogeneous enlargement of the right thyroid  lobe measuring 4.5 cm in AP dimension.No mediastinal, hilar, or axillary lymphadenopathy. Lungs/Pleura: The midline trachea and bronchi are patent. Posterior bibasilar dependent atelectasis. No focal airspace consolidation, pleural effusion, or pneumothorax. Musculoskeletal: No acute fracture or destructive bone lesion. Multilevel degenerative disc disease of the spine. Osteopenia. Upper Abdomen: No acute abnormality in the partially visualized upper abdomen. Review of the MIP images confirms the above findings. IMPRESSION: 1. Although there has been interval resolution of  some of the pulmonary emboli on the prior study, there are new emboli in the left upper lobe lingular branch and left interlobar pulmonary arteries. While there is subtle reflux of contrast into the hepatic veins, as can be seen in elevated right heart pressures or right heart strain, the RV to LV ratio remains normal and there are no additional findings of right heart strain. Laboratory correlation recommended. If this remains of clinical concern, a follow-up echocardiogram should be considered. 2. No pneumonia, pulmonary edema, or pleural effusion. 3. Similar appearance of the heterogeneously enlarged right thyroid  lobe. If not previously evaluated, a nonemergent thyroid  ultrasound is recommended for further characterization. Critical Value/emergent results were called by telephone at the time of interpretation on 06/26/2024 at 1501 to provider Dr. Jillian, who verbally acknowledged these results. Electronically Signed   By: Rogelia Myers M.D.   On: 06/26/2024 15:01    Scheduled Meds:  Chlorhexidine  Gluconate Cloth  6 each Topical Daily   Continuous Infusions:     LOS: 5 days   Ivonne Jillian, MD [facility] P8/20/2025, 11:37 AM

## 2024-06-28 NOTE — Plan of Care (Signed)

## 2024-06-29 DIAGNOSIS — K922 Gastrointestinal hemorrhage, unspecified: Secondary | ICD-10-CM | POA: Diagnosis not present

## 2024-06-29 LAB — CBC
HCT: 29.4 % — ABNORMAL LOW (ref 36.0–46.0)
Hemoglobin: 9.4 g/dL — ABNORMAL LOW (ref 12.0–15.0)
MCH: 27.5 pg (ref 26.0–34.0)
MCHC: 32 g/dL (ref 30.0–36.0)
MCV: 86 fL (ref 80.0–100.0)
Platelets: 271 K/uL (ref 150–400)
RBC: 3.42 MIL/uL — ABNORMAL LOW (ref 3.87–5.11)
RDW: 15.2 % (ref 11.5–15.5)
WBC: 7.9 K/uL (ref 4.0–10.5)
nRBC: 0 % (ref 0.0–0.2)

## 2024-06-29 LAB — BASIC METABOLIC PANEL WITH GFR
Anion gap: 8 (ref 5–15)
BUN: 19 mg/dL (ref 8–23)
CO2: 25 mmol/L (ref 22–32)
Calcium: 8.8 mg/dL — ABNORMAL LOW (ref 8.9–10.3)
Chloride: 104 mmol/L (ref 98–111)
Creatinine, Ser: 1.1 mg/dL — ABNORMAL HIGH (ref 0.44–1.00)
GFR, Estimated: 54 mL/min — ABNORMAL LOW (ref >60)
Glucose, Bld: 80 mg/dL (ref 70–99)
Potassium: 3.7 mmol/L (ref 3.5–5.1)
Sodium: 137 mmol/L (ref 135–145)

## 2024-06-29 MED ORDER — SODIUM CHLORIDE 0.9% FLUSH
10.0000 mL | Freq: Two times a day (BID) | INTRAVENOUS | Status: DC
  Administered 2024-06-29: 10 mL

## 2024-06-29 NOTE — Progress Notes (Addendum)
 AVS reviewed w/ pt who verbalized an understanding. No other questions, PICC removed at 1119. PICC removal after care added to AVS - copy of info given to pt- will use 48 hour guideline per IV team per pt.  Pt will dress for home after 30 minutes. Central tele notified of pt d/c to home- tele removed and box returned to desk.  Pt drove self to hospital- will take shuttle to car.

## 2024-06-29 NOTE — Discharge Summary (Signed)
 Physician Discharge Summary  DAE ANTONUCCI FMW:[id number] DOB: 10/06/1956 DOA: 06/23/2024  PCP: Joshua Debby CROME, MD  Admit date: 06/23/2024 Discharge date: 06/29/2024  Admitted From: Home Disposition:  Home  Discharge Condition:Stable CODE STATUS:FULL Diet recommendation: Heart Healthy   Brief/Interim Summary: Patient is a 68 year old female with history of ulcerative pancolitis status post colectomy, postoperative fistulas, small bowel obstruction, high ostomy output, PE/DVT on Eliquis  who presented from home with complaint of bloody output from ileostomy.  She was admitted on June 24 for the same problem.  Report of multiple episodes of bloody output in the ileostomy bag, dizziness.  On presentation hemoglobin was 10.2.  CT abdomen/pelvis showed accumulation of high density material and dilated segment of small bowel near the surgical anastomotic site.  Suspected GI bleed.  GI consulted.  After admission, she became unresponsive and hypotensive.  Patient transferred to ICU service, given blood transfusion.  Currently hemoglobin stable, she is hemodynamically stable.  Transferred back to [facility] service on 8/18.  GI/oncology were consulted .  CT chest done on this hospitalization also showed new left-sided PE.  Currently she is asymptomatic.  Decision made to put an IVC filter.  Eliquis  will be held on discharge.  Plan for resumption  of Eliquis  in the future after discussing with Dr. Federico as an outpatient  Following problems were addressed during the hospitalization:   Hemorrhagic shock /syncope secondary recurrent upper GI bleed in the setting of anticoagulation: Presented with complaint of bloody output from ileostomy.  Report of multiple episodes of bloody output in the ileostomy bag, dizziness.  Became hypotensive unresponsive during this hospitalization and had to be transferred to ICU.  Given 2 units of PRBC.  Given Kcentra , tranexamic acid . CT angiogram on 8/15 showed accumulation of high  density material seen in dilated segment of small bowel in pelvis near surgical anastomosis, concerning for possible focus of gastrointestinal bleeding. IR was consulted for arterial embolization on 8/15 and underwent mesenteric aortography but no source was located.  GI was following.  Ileoscopy did not show any bleeding.    Currently on PPI, continue the same.  We also consulted general surgery for any surgical approach for the management of GI bleed as seen on the anastomotic area.  General surgery declined for any intervention. Hemoglobin in the range of 9 today.  No plan for further workup.  Check CBC in a week.   Acute blood loss secondary to upper GI bleed: Given 2 units of transfusion during this hospitalization.  Currently hemoglobin stable in the range 9.  Given IV iron  during this hospitalization   CKD stage IIIa: Currently kidney function at baseline.   History of DVT/PE: Was taking Eliquis  at home.  She was previously found to have DVT, acute bilateral PE without evidence of heart strain.  Currently Eliquis  on hold.  She follows with Dr Federico who was following her here. CT angiogram done here showed new PE on the left, no significant right heart strain, follow-up echo.  Currently anticoagulation on hold.  IR put IVC filter on 8/20. she will follow-up with Dr. Federico and will be started anticoagulation at some point as an outpatient   Hypokalemia: Continue potassium supplementation at home   History of ulcerative colitis/ileostomy: Follows with GI   History of thyroid  mass: Should be follow-up as an outpatient  Discharge Diagnoses:  Principal Problem:   GI bleed Active Problems:   Hemorrhagic shock (HCC)   Stage 3a chronic kidney disease (HCC)   History of total colectomy  Chronic anticoagulation   History of pulmonary embolism   Abnormal computed tomography angiography (CTA) of abdomen   Acute blood loss anemia   Foreign body in small intestine   Deep vein thrombosis (DVT)  of proximal lower extremity (HCC)   Small bowel bleed not requiring more than 4 units of blood in 24 hours, ICU, or surgery    Discharge Instructions  Discharge Instructions     Diet - low sodium heart healthy   Complete by: As directed    Discharge instructions   Complete by: As directed    1)Please take your medications as instructed 2)Follow up with your PCP in a week and do a CBC to check your hemoglobin 3)Follow up with your hematologist, Dr. Federico.  Dr. Federico will determine when to restart Eliquis    Increase activity slowly   Complete by: As directed    No wound care   Complete by: As directed       Allergies as of 06/29/2024       Reactions   Betadine  [povidone Iodine ] Rash, Other (See Comments)   The skin is very sensitive   Latex Rash, Other (See Comments)   Redness of skin   Tape Rash, Other (See Comments)   Redness of skin OK with hypofix tape and paper tape! NO transpore tape adhesive tape!        Medication List     PAUSE taking these medications    apixaban  5 MG Tabs tablet Wait to take this until your doctor or other care provider tells you to start again. Commonly known as: ELIQUIS  Take 1 tablet (5 mg total) by mouth 2 (two) times daily.       TAKE these medications    ALPRAZolam  0.25 MG tablet Commonly known as: XANAX  Take 1 tablet (0.25 mg total) by mouth 2 (two) times daily as needed for anxiety.   augmented betamethasone dipropionate 0.05 % cream Commonly known as: DIPROLENE-AF Apply 1 Application topically 2 (two) times daily as needed (Dermatitis).   cholecalciferol 25 MCG (1000 UNIT) tablet Commonly known as: VITAMIN D3 Take 1,000 Units by mouth daily.   hydrocortisone  2.5 % cream APPLY TOPICALLY TWICE DAILY What changed:  how much to take when to take this reasons to take this   indapamide  1.25 MG tablet Commonly known as: LOZOL  TAKE 1 TABLET BY MOUTH DAILY   losartan  50 MG tablet Commonly known as: COZAAR  Take 1  tablet (50 mg total) by mouth daily.   multivitamin with minerals Tabs tablet Take 1 tablet by mouth daily with breakfast.   nystatin  cream Commonly known as: MYCOSTATIN  Apply 1 Application topically 2 (two) times daily. What changed:  when to take this reasons to take this   pantoprazole  40 MG tablet Commonly known as: PROTONIX  Take 1 tablet (40 mg total) by mouth daily.   potassium chloride  10 MEQ tablet Commonly known as: KLOR-CON  TAKE 1 TABLET BY MOUTH 3 TIMES  DAILY        Follow-up Information     Dorsey, John T IV, MD. Schedule an appointment as soon as possible for a visit in 1 week(s).   Specialty: Hematology and Oncology Contact information: [address] [place] [address] [phone]                Allergies  Allergen Reactions   Betadine  [Povidone Iodine ] Rash and Other (See Comments)    The skin is very sensitive   Latex Rash and Other (See Comments)  Redness of skin   Tape Rash and Other (See Comments)    Redness of skin OK with hypofix tape and paper tape! NO transpore tape adhesive tape!    Consultations: IR, GI, oncology, general surgery   Procedures/Studies: IR IVC FILTER PLMT / S&I /IMG GUID/MOD SED Result Date: 06/29/2024 CLINICAL DATA:  recurrent pulmonary emboli despite anticoagulation. Caval filtration requested. No peripheral IV access could be obtained by RN. EXAM: INFERIOR VENACAVOGRAM IVC FILTER PLACEMENT UNDER FLUOROSCOPY CENTRAL VENOUS CATHETER PLACEMENT UNDER ULTRASOUND FLUOROSCOPY: Radiation Exposure Index (as provided by the fluoroscopic device): 30 mGy air Kerma TECHNIQUE: Patency of the right IJ vein was confirmed with ultrasound with image documentation. An appropriate skin site was determined. Skin site was marked, prepped with chlorhexidine , and draped using maximum barrier technique. The region was infiltrated locally with 1% lidocaine . Intravenous Fentanyl  50mcg and Versed  1mg  were administered by RN during  a total moderate (conscious) sedation time of 11 minutes; the patient's level of consciousness and physiological / cardiorespiratory status were monitored continuously by radiology RN under my direct supervision. Under real-time ultrasound guidance, the right IJ vein was accessed with a 21 gauge micropuncture needle; the needle tip within the vein was confirmed with ultrasound image documentation. The needle was exchanged over a 018 guidewire for a transitional dilator, which allow advancement of the [facility] wire into the IVC. A long 6 French vascular sheath was placed for inferior venacavography. This demonstrated no caval thrombus. Renal vein inflows were evident. The retrievable Denali IVC filter was advanced through the sheath and successfully deployed under fluoroscopy at the L2 level. Followup cavagram demonstrates stable filter position and no evident complication. The sheath was exchanged over guidewire for a 5 French 14 cm single-lumen central venous catheter, position with its tip at the cavoatrial junction, position confirmed under fluoroscopy. Catheter flushed, secured externally with 0 Prolene suture and covered with sterile dressing. The patient tolerated the procedure well. No immediate complication. IMPRESSION: 1. Normal IVC. No thrombus or significant anatomic variation. 2. Technically successful infrarenal IVC filter placement. This is a retrievable model. 3. Right IJ central venous catheter placement to the cavoatrial junction. PLAN: This IVC filter is potentially retrievable. The patient will be assessed for filter retrieval by Interventional Radiology in approximately 8-12 weeks. Further recommendations regarding filter retrieval, continued surveillance or declaration of device permanence, will be made at that time. Electronically Signed   By: JONETTA Faes M.D.   On: 06/29/2024 10:03   IR NON-TUNNELED CENTRAL VENOUS CATH [facility] W IMG Result Date: 06/29/2024 CLINICAL DATA:  recurrent pulmonary  emboli despite anticoagulation. Caval filtration requested. No peripheral IV access could be obtained by RN. EXAM: INFERIOR VENACAVOGRAM IVC FILTER PLACEMENT UNDER FLUOROSCOPY CENTRAL VENOUS CATHETER PLACEMENT UNDER ULTRASOUND FLUOROSCOPY: Radiation Exposure Index (as provided by the fluoroscopic device): 30 mGy air Kerma TECHNIQUE: Patency of the right IJ vein was confirmed with ultrasound with image documentation. An appropriate skin site was determined. Skin site was marked, prepped with chlorhexidine , and draped using maximum barrier technique. The region was infiltrated locally with 1% lidocaine . Intravenous Fentanyl  50mcg and Versed  1mg  were administered by RN during a total moderate (conscious) sedation time of 11 minutes; the patient's level of consciousness and physiological / cardiorespiratory status were monitored continuously by radiology RN under my direct supervision. Under real-time ultrasound guidance, the right IJ vein was accessed with a 21 gauge micropuncture needle; the needle tip within the vein was confirmed with ultrasound image documentation. The needle was exchanged over a 018 guidewire for  a transitional dilator, which allow advancement of the [facility] wire into the IVC. A long 6 French vascular sheath was placed for inferior venacavography. This demonstrated no caval thrombus. Renal vein inflows were evident. The retrievable Denali IVC filter was advanced through the sheath and successfully deployed under fluoroscopy at the L2 level. Followup cavagram demonstrates stable filter position and no evident complication. The sheath was exchanged over guidewire for a 5 French 14 cm single-lumen central venous catheter, position with its tip at the cavoatrial junction, position confirmed under fluoroscopy. Catheter flushed, secured externally with 0 Prolene suture and covered with sterile dressing. The patient tolerated the procedure well. No immediate complication. IMPRESSION: 1. Normal IVC. No  thrombus or significant anatomic variation. 2. Technically successful infrarenal IVC filter placement. This is a retrievable model. 3. Right IJ central venous catheter placement to the cavoatrial junction. PLAN: This IVC filter is potentially retrievable. The patient will be assessed for filter retrieval by Interventional Radiology in approximately 8-12 weeks. Further recommendations regarding filter retrieval, continued surveillance or declaration of device permanence, will be made at that time. Electronically Signed   By: JONETTA Faes M.D.   On: 06/29/2024 10:03   ECHOCARDIOGRAM COMPLETE Result Date: 06/26/2024    ECHOCARDIOGRAM REPORT   Patient Name:   CHANDLER STOFER Date of Exam: 06/26/2024 Medical Rec #:  [id number]           Height:       63.0 in Accession #:    [id number]          Weight:       169.1 lb Date of Birth:  09/21/1956            BSA:          1.800 m Patient Age:    68 years            BP:           138/79 mmHg Patient Gender: F                   HR:           63 bpm. Exam Location:  Inpatient Procedure: 2D Echo, Cardiac Doppler and Color Doppler (Both Spectral and Color            Flow Doppler were utilized during procedure). Indications:    pulmonary embolus  History:        Patient has prior history of Echocardiogram examinations, most                 recent 04/22/2024.  Sonographer:    Therisa Crouch Referring Phys: [id number] Jantzen Pilger IMPRESSIONS  1. Left ventricular ejection fraction, by estimation, is 55 to 60%. The left ventricle has normal function. The left ventricle has no regional wall motion abnormalities. There is mild concentric left ventricular hypertrophy. Left ventricular diastolic parameters are consistent with Grade I diastolic dysfunction (impaired relaxation).  2. Right ventricular systolic function is normal. The right ventricular size is normal. There is normal pulmonary artery systolic pressure. The estimated right ventricular systolic pressure is 21.8 mmHg.  3. The  mitral valve is normal in structure. Trivial mitral valve regurgitation. No evidence of mitral stenosis.  4. The aortic valve is tricuspid. Aortic valve regurgitation is not visualized. No aortic stenosis is present.  5. The inferior vena cava is normal in size with greater than 50% respiratory variability, suggesting right atrial pressure of 3 mmHg. FINDINGS  Left Ventricle: Left ventricular ejection fraction, by  estimation, is 55 to 60%. The left ventricle has normal function. The left ventricle has no regional wall motion abnormalities. The left ventricular internal cavity size was normal in size. There is  mild concentric left ventricular hypertrophy. Left ventricular diastolic parameters are consistent with Grade I diastolic dysfunction (impaired relaxation). Right Ventricle: The right ventricular size is normal. No increase in right ventricular wall thickness. Right ventricular systolic function is normal. There is normal pulmonary artery systolic pressure. The tricuspid regurgitant velocity is 2.17 m/s, and  with an assumed right atrial pressure of 3 mmHg, the estimated right ventricular systolic pressure is 21.8 mmHg. Left Atrium: Left atrial size was normal in size. Right Atrium: Right atrial size was normal in size. Pericardium: There is no evidence of pericardial effusion. Mitral Valve: The mitral valve is normal in structure. Trivial mitral valve regurgitation. No evidence of mitral valve stenosis. Tricuspid Valve: The tricuspid valve is normal in structure. Tricuspid valve regurgitation is trivial. Aortic Valve: The aortic valve is tricuspid. Aortic valve regurgitation is not visualized. No aortic stenosis is present. Pulmonic Valve: The pulmonic valve was normal in structure. Pulmonic valve regurgitation is not visualized. Aorta: The aortic root is normal in size and structure. Venous: The inferior vena cava is normal in size with greater than 50% respiratory variability, suggesting right atrial  pressure of 3 mmHg. IAS/Shunts: No atrial level shunt detected by color flow Doppler.  LEFT VENTRICLE PLAX 2D LVIDd:         3.40 cm   Diastology LVIDs:         2.10 cm   LV e' medial:    10.60 cm/s LV PW:         1.00 cm   LV E/e' medial:  7.4 LV IVS:        1.10 cm   LV e' lateral:   11.90 cm/s LVOT diam:     1.90 cm   LV E/e' lateral: 6.6 LVOT Area:     2.84 cm  RIGHT VENTRICLE             IVC RV Basal diam:  2.80 cm     IVC diam: 1.90 cm RV S prime:     11.40 cm/s TAPSE (M-mode): 2.8 cm LEFT ATRIUM             Index        RIGHT ATRIUM           Index LA diam:        2.20 cm 1.22 cm/m   RA Area:     17.40 cm LA Vol (A2C):   51.6 ml 28.66 ml/m  RA Volume:   42.70 ml  23.72 ml/m LA Vol (A4C):   43.9 ml 24.38 ml/m LA Biplane Vol: 47.8 ml 26.55 ml/m   AORTA Ao Root diam: 3.00 cm Ao Asc diam:  3.60 cm MITRAL VALVE               TRICUSPID VALVE MV Area (PHT): 3.60 cm    TR Peak grad:   18.8 mmHg MV Decel Time: 211 msec    TR Vmax:        217.00 cm/s MV E velocity: 78.00 cm/s MV A velocity: 71.10 cm/s  SHUNTS MV E/A ratio:  1.10        Systemic Diam: 1.90 cm Dalton McleanMD Electronically signed by Ezra Kanner Signature Date/Time: 06/26/2024/9:01:14 PM    Final    DG Abd 1 View Result Date: 06/26/2024 CLINICAL DATA:  Evaluate for foreign body.  Retained capsule. EXAM: ABDOMEN - 1 VIEW COMPARISON:  Abdominal x-ray 06/26/2024. FINDINGS: Previous identified rectangular foreign body overlying the colostomy is no longer seen. Surgical anastomotic staple line is again noted in the pelvis. There is contrast in the bladder and renal collecting systems. A can give a are likely external artifact is overlying the lower right hemipelvis which appears unchanged from prior. External snap artifact overlies the lateral left abdominal wall. No new unexpected radiopaque foreign body identified. Bowel-gas pattern is nonobstructive. No suspicious calcifications are seen. IMPRESSION: Previous identified rectangular foreign  body overlying the colostomy is no longer seen. No new unexpected radiopaque foreign body identified. Electronically Signed   By: Greig Pique M.D.   On: 06/26/2024 19:18   CT Angio Chest Pulmonary Embolism (PE) W or WO Contrast Result Date: 06/26/2024 CLINICAL DATA:  History of bilateral DVTs, on Eliquis  EXAM: CT ANGIOGRAPHY CHEST WITH CONTRAST TECHNIQUE: Multidetector CT imaging of the chest was performed using the standard protocol during bolus administration of intravenous contrast. Multiplanar CT image reconstructions and MIPs were obtained to evaluate the vascular anatomy. RADIATION DOSE REDUCTION: This exam was performed according to the departmental dose-optimization program which includes automated exposure control, adjustment of the mA and/or kV according to patient size and/or use of iterative reconstruction technique. CONTRAST:  75mL OMNIPAQUE  IOHEXOL  350 MG/ML SOLN COMPARISON:  April 20, 2024 FINDINGS: Pulmonary Embolism: Resolution of the acute embolus within the interlobar pulmonary artery in the right lung. Small amount of residual distal segmental/subsegmental embolus in the basilar segments of the right lower lobe. Increasing clot burden within the left upper lobe lingular branch, extending into the interlobar pulmonary artery and into the lobar branch of the left lower lobe. The distal segmental and subsegmental emboli in the left lower lobe on the prior study have resolved. Cardiovascular: No cardiomegaly or pericardial effusion.No aortic aneurysm. Subtle reflux of contrast into the hepatic veins. Mediastinum/Nodes: No mediastinal mass.Similar heterogeneous enlargement of the right thyroid  lobe measuring 4.5 cm in AP dimension.No mediastinal, hilar, or axillary lymphadenopathy. Lungs/Pleura: The midline trachea and bronchi are patent. Posterior bibasilar dependent atelectasis. No focal airspace consolidation, pleural effusion, or pneumothorax. Musculoskeletal: No acute fracture or destructive  bone lesion. Multilevel degenerative disc disease of the spine. Osteopenia. Upper Abdomen: No acute abnormality in the partially visualized upper abdomen. Review of the MIP images confirms the above findings. IMPRESSION: 1. Although there has been interval resolution of some of the pulmonary emboli on the prior study, there are new emboli in the left upper lobe lingular branch and left interlobar pulmonary arteries. While there is subtle reflux of contrast into the hepatic veins, as can be seen in elevated right heart pressures or right heart strain, the RV to LV ratio remains normal and there are no additional findings of right heart strain. Laboratory correlation recommended. If this remains of clinical concern, a follow-up echocardiogram should be considered. 2. No pneumonia, pulmonary edema, or pleural effusion. 3. Similar appearance of the heterogeneously enlarged right thyroid  lobe. If not previously evaluated, a nonemergent thyroid  ultrasound is recommended for further characterization. Critical Value/emergent results were called by telephone at the time of interpretation on 06/26/2024 at 1501 to provider Dr. Jillian, who verbally acknowledged these results. Electronically Signed   By: Rogelia Myers M.D.   On: 06/26/2024 15:01   DG Abd 1 View Result Date: 06/26/2024 CLINICAL DATA:  [id number] Foreign body alimentary tract, subsequent encounter [phone]. EXAM: ABDOMEN - 1 VIEW COMPARISON:  01/16/2020. FINDINGS: The bowel  gas pattern is non-obstructive. Bowel anastomotic suture noted overlying the lower sacrum region. No evidence of pneumoperitoneum, within the limitations of a supine film. No acute osseous abnormalities. The soft tissues are within normal limits. Surgical changes, devices, tubes and lines: There is a rectangular 1.3 x 2.0 cm opacity overlying the right lower quadrant region superimposed on colostomy ring. It is unclear whether this is external to the patient. Correlate with physical  examination. IMPRESSION: Nonobstructive bowel gas pattern. There is a rectangular 1.3 x 2.0 cm opacity overlying the right lower quadrant region superimposed on colostomy ring. It is unclear whether this is external to the patient. Correlate with physical examination. Electronically Signed   By: Ree Molt M.D.   On: 06/26/2024 11:40   VAS US  LOWER EXTREMITY VENOUS (DVT) Result Date: 06/25/2024  Lower Venous DVT Study Patient Name:  GAYTHA RAYBOURN  Date of Exam:   06/25/2024 Medical Rec #: [id number]            Accession #:    [id number] Date of Birth: December 22, 1955             Patient Gender: F Patient Age:   71 years Exam Location:  [facility] Procedure:      VAS US  LOWER EXTREMITY VENOUS (DVT) Referring Phys: DORN CHILL --------------------------------------------------------------------------------  Indications: Pulmonary embolism.  Risk Factors: DVT. Limitations: Bandages, poor ultrasound/tissue interface and line. Comparison Study: 04/21/2024 - RIGHT:                   - Findings consistent with acute deep vein thrombosis                   involving the right peroneal veins.                   - No cystic structure found in the popliteal fossa.                    LEFT:                   - Findings consistent with acute, non occlusive DVT noted in                   the left proximal femoral vein and profunda vein.                   - No cystic structure found in the popliteal fossa. Performing Technologist: Cordella COLLET RVT  Examination Guidelines: A complete evaluation includes B-mode imaging, spectral Doppler, color Doppler, and power Doppler as needed of all accessible portions of each vessel. Bilateral testing is considered an integral part of a complete examination. Limited examinations for reoccurring indications may be performed as noted. The reflux portion of the exam is performed with the patient in reverse Trendelenburg.   +---------+---------------+---------+-----------+----------+--------------+ RIGHT    CompressibilityPhasicitySpontaneityPropertiesThrombus Aging +---------+---------------+---------+-----------+----------+--------------+ CFV      Full           Yes      Yes                                 +---------+---------------+---------+-----------+----------+--------------+ SFJ      Full                                                        +---------+---------------+---------+-----------+----------+--------------+  FV Prox  Full                                                        +---------+---------------+---------+-----------+----------+--------------+ FV Mid   Full                                                        +---------+---------------+---------+-----------+----------+--------------+ FV DistalFull                                                        +---------+---------------+---------+-----------+----------+--------------+ PFV      Full                                                        +---------+---------------+---------+-----------+----------+--------------+ POP      Full           Yes      Yes                                 +---------+---------------+---------+-----------+----------+--------------+ PTV      Full                                                        +---------+---------------+---------+-----------+----------+--------------+ PERO     Full                                                        +---------+---------------+---------+-----------+----------+--------------+   +---------+---------------+---------+-----------+----------+--------------+ LEFT     CompressibilityPhasicitySpontaneityPropertiesThrombus Aging +---------+---------------+---------+-----------+----------+--------------+ CFV      Full           Yes      Yes                                  +---------+---------------+---------+-----------+----------+--------------+ SFJ      Full                                                        +---------+---------------+---------+-----------+----------+--------------+ FV Prox  Full                                                        +---------+---------------+---------+-----------+----------+--------------+  FV Mid   Full                                                        +---------+---------------+---------+-----------+----------+--------------+ FV DistalFull                                                        +---------+---------------+---------+-----------+----------+--------------+ PFV      Full                                                        +---------+---------------+---------+-----------+----------+--------------+ POP      Full           Yes      Yes                                 +---------+---------------+---------+-----------+----------+--------------+ PTV      Full                                                        +---------+---------------+---------+-----------+----------+--------------+ PERO     Full                                                        +---------+---------------+---------+-----------+----------+--------------+     Summary: RIGHT: - There is no evidence of deep vein thrombosis in the lower extremity.  - No cystic structure found in the popliteal fossa.  LEFT: - There is no evidence of deep vein thrombosis in the lower extremity.  - No cystic structure found in the popliteal fossa.  *See table(s) above for measurements and observations. Electronically signed by Penne Colorado MD on 06/25/2024 at 11:42:43 AM.    Final    IR Angiogram Visceral Selective Result Date: 06/24/2024 INDICATION: GI bleed.  Question small bowel extravasation on CTA. EXAM: Title: MESENTERIC ARTERIOGRAM Procedures: 1. ULTRASOUND-GUIDED RIGHT COMMON FEMORAL ARTERIAL ACCESS 2. MESENTERIC  ARTERIOGRAPHY, including SMA and SMA BRANCH SELECTIVE ANGIOGRAPHY COMPARISON:  CTA AP, 06/23/2024 and 06/03/2024. MEDICATIONS: None ANESTHESIA/SEDATION: Local anesthetic and single agent sedation was employed during this procedure. A total of fentanyl  100 mcg was administered intravenously. The patient's level of consciousness and vital signs were monitored continuously by radiology nursing throughout the procedure under my direct supervision. CONTRAST:  50 mL Omnipaque  300 FLUOROSCOPY: Radiation Exposure Index and estimated peak skin dose (PSD); Reference air kerma (RAK), 112 mGy. COMPLICATIONS: None immediate. PROCEDURE: Informed consent was obtained from the patient and/or patient's representative following explanation of the procedure, risks, benefits and alternatives. All questions were addressed. A time out was performed prior to the initiation of the procedure. Maximal barrier sterile technique  utilized including caps, mask, sterile gowns, sterile gloves, large sterile drape, hand hygiene, and chlorhexidine  prep. The RIGHT femoral head was marked fluoroscopically. Ultrasound-guided access of the RIGHT femoral artery was obtained, allowing placement of a 5 Fr, 35 cm Brite tip vascular sheath. An ultrasound image was saved to PACS. A limited arteriogram was performed through the side arm of the sheath confirming appropriate access within the RIGHT common femoral artery. Over a Bentson wire, a Mickelson catheter was advanced the caudal aspect of the thoracic aorta where was reformed, back bled and flushed. The catheter was then utilized to select the superior mesenteric artery and selective arteriogram was performed. Distal SMA branch access was obtained with a 2.4 Fr Progreat alpha microcatheter and 0.016 inch Fathom microwire. Super selective arteriograms were performed. Images were reviewed and the procedure was terminated. All wires and catheters were removed from the patient. Hemostasis was achieved at the  RIGHT groin access site with Angio-Seal closure device. The patient tolerated the procedure well without immediate post procedural complication. FINDINGS: *Access via the RIGHT femoral artery. *No active extravasation of contrast on mesenteric arteriography, either from the SMA or distal SMA branch on selective or super selective angiography. IMPRESSION: 1. Mesenteric arteriography, including SMA and distal SMA branch super selective interrogation. 2. No active contrast extravasation on catheter angiography. No embolization was performed. Thom Hall, MD [facility] Radiology Electronically Signed   By: Thom Hall M.D.   On: 06/24/2024 14:27   IR US  Guide Vasc Access Right Result Date: 06/24/2024 INDICATION: GI bleed.  Question small bowel extravasation on CTA. EXAM: Title: MESENTERIC ARTERIOGRAM Procedures: 1. ULTRASOUND-GUIDED RIGHT COMMON FEMORAL ARTERIAL ACCESS 2. MESENTERIC ARTERIOGRAPHY, including SMA and SMA BRANCH SELECTIVE ANGIOGRAPHY COMPARISON:  CTA AP, 06/23/2024 and 06/03/2024. MEDICATIONS: None ANESTHESIA/SEDATION: Local anesthetic and single agent sedation was employed during this procedure. A total of fentanyl  100 mcg was administered intravenously. The patient's level of consciousness and vital signs were monitored continuously by radiology nursing throughout the procedure under my direct supervision. CONTRAST:  50 mL Omnipaque  300 FLUOROSCOPY: Radiation Exposure Index and estimated peak skin dose (PSD); Reference air kerma (RAK), 112 mGy. COMPLICATIONS: None immediate. PROCEDURE: Informed consent was obtained from the patient and/or patient's representative following explanation of the procedure, risks, benefits and alternatives. All questions were addressed. A time out was performed prior to the initiation of the procedure. Maximal barrier sterile technique utilized including caps, mask, sterile gowns, sterile gloves, large sterile drape, hand  hygiene, and chlorhexidine  prep. The RIGHT femoral head was marked fluoroscopically. Ultrasound-guided access of the RIGHT femoral artery was obtained, allowing placement of a 5 Fr, 35 cm Brite tip vascular sheath. An ultrasound image was saved to PACS. A limited arteriogram was performed through the side arm of the sheath confirming appropriate access within the RIGHT common femoral artery. Over a Bentson wire, a Mickelson catheter was advanced the caudal aspect of the thoracic aorta where was reformed, back bled and flushed. The catheter was then utilized to select the superior mesenteric artery and selective arteriogram was performed. Distal SMA branch access was obtained with a 2.4 Fr Progreat alpha microcatheter and 0.016 inch Fathom microwire. Super selective arteriograms were performed. Images were reviewed and the procedure was terminated. All wires and catheters were removed from the patient. Hemostasis was achieved at the RIGHT groin access site with Angio-Seal closure device. The patient tolerated the procedure well without immediate post procedural complication. FINDINGS: *Access via the RIGHT femoral artery. *No active extravasation of  contrast on mesenteric arteriography, either from the SMA or distal SMA branch on selective or super selective angiography. IMPRESSION: 1. Mesenteric arteriography, including SMA and distal SMA branch super selective interrogation. 2. No active contrast extravasation on catheter angiography. No embolization was performed. Thom Hall, MD [facility] Radiology Electronically Signed   By: Thom Hall M.D.   On: 06/24/2024 14:27   IR Angiogram Selective Each Additional Vessel Result Date: 06/24/2024 INDICATION: GI bleed.  Question small bowel extravasation on CTA. EXAM: Title: MESENTERIC ARTERIOGRAM Procedures: 1. ULTRASOUND-GUIDED RIGHT COMMON FEMORAL ARTERIAL ACCESS 2. MESENTERIC ARTERIOGRAPHY, including SMA and SMA  BRANCH SELECTIVE ANGIOGRAPHY COMPARISON:  CTA AP, 06/23/2024 and 06/03/2024. MEDICATIONS: None ANESTHESIA/SEDATION: Local anesthetic and single agent sedation was employed during this procedure. A total of fentanyl  100 mcg was administered intravenously. The patient's level of consciousness and vital signs were monitored continuously by radiology nursing throughout the procedure under my direct supervision. CONTRAST:  50 mL Omnipaque  300 FLUOROSCOPY: Radiation Exposure Index and estimated peak skin dose (PSD); Reference air kerma (RAK), 112 mGy. COMPLICATIONS: None immediate. PROCEDURE: Informed consent was obtained from the patient and/or patient's representative following explanation of the procedure, risks, benefits and alternatives. All questions were addressed. A time out was performed prior to the initiation of the procedure. Maximal barrier sterile technique utilized including caps, mask, sterile gowns, sterile gloves, large sterile drape, hand hygiene, and chlorhexidine  prep. The RIGHT femoral head was marked fluoroscopically. Ultrasound-guided access of the RIGHT femoral artery was obtained, allowing placement of a 5 Fr, 35 cm Brite tip vascular sheath. An ultrasound image was saved to PACS. A limited arteriogram was performed through the side arm of the sheath confirming appropriate access within the RIGHT common femoral artery. Over a Bentson wire, a Mickelson catheter was advanced the caudal aspect of the thoracic aorta where was reformed, back bled and flushed. The catheter was then utilized to select the superior mesenteric artery and selective arteriogram was performed. Distal SMA branch access was obtained with a 2.4 Fr Progreat alpha microcatheter and 0.016 inch Fathom microwire. Super selective arteriograms were performed. Images were reviewed and the procedure was terminated. All wires and catheters were removed from the patient. Hemostasis was achieved at the RIGHT groin access site with  Angio-Seal closure device. The patient tolerated the procedure well without immediate post procedural complication. FINDINGS: *Access via the RIGHT femoral artery. *No active extravasation of contrast on mesenteric arteriography, either from the SMA or distal SMA branch on selective or super selective angiography. IMPRESSION: 1. Mesenteric arteriography, including SMA and distal SMA branch super selective interrogation. 2. No active contrast extravasation on catheter angiography. No embolization was performed. Thom Hall, MD [facility] Radiology Electronically Signed   By: Thom Hall M.D.   On: 06/24/2024 14:27   IR Angiogram Selective Each Additional Vessel Result Date: 06/24/2024 INDICATION: GI bleed.  Question small bowel extravasation on CTA. EXAM: Title: MESENTERIC ARTERIOGRAM Procedures: 1. ULTRASOUND-GUIDED RIGHT COMMON FEMORAL ARTERIAL ACCESS 2. MESENTERIC ARTERIOGRAPHY, including SMA and SMA BRANCH SELECTIVE ANGIOGRAPHY COMPARISON:  CTA AP, 06/23/2024 and 06/03/2024. MEDICATIONS: None ANESTHESIA/SEDATION: Local anesthetic and single agent sedation was employed during this procedure. A total of fentanyl  100 mcg was administered intravenously. The patient's level of consciousness and vital signs were monitored continuously by radiology nursing throughout the procedure under my direct supervision. CONTRAST:  50 mL Omnipaque  300 FLUOROSCOPY: Radiation Exposure Index and estimated peak skin dose (PSD); Reference air kerma (RAK), 112 mGy. COMPLICATIONS: None immediate.  PROCEDURE: Informed consent was obtained from the patient and/or patient's representative following explanation of the procedure, risks, benefits and alternatives. All questions were addressed. A time out was performed prior to the initiation of the procedure. Maximal barrier sterile technique utilized including caps, mask, sterile gowns, sterile gloves, large sterile drape, hand hygiene, and  chlorhexidine  prep. The RIGHT femoral head was marked fluoroscopically. Ultrasound-guided access of the RIGHT femoral artery was obtained, allowing placement of a 5 Fr, 35 cm Brite tip vascular sheath. An ultrasound image was saved to PACS. A limited arteriogram was performed through the side arm of the sheath confirming appropriate access within the RIGHT common femoral artery. Over a Bentson wire, a Mickelson catheter was advanced the caudal aspect of the thoracic aorta where was reformed, back bled and flushed. The catheter was then utilized to select the superior mesenteric artery and selective arteriogram was performed. Distal SMA branch access was obtained with a 2.4 Fr Progreat alpha microcatheter and 0.016 inch Fathom microwire. Super selective arteriograms were performed. Images were reviewed and the procedure was terminated. All wires and catheters were removed from the patient. Hemostasis was achieved at the RIGHT groin access site with Angio-Seal closure device. The patient tolerated the procedure well without immediate post procedural complication. FINDINGS: *Access via the RIGHT femoral artery. *No active extravasation of contrast on mesenteric arteriography, either from the SMA or distal SMA branch on selective or super selective angiography. IMPRESSION: 1. Mesenteric arteriography, including SMA and distal SMA branch super selective interrogation. 2. No active contrast extravasation on catheter angiography. No embolization was performed. Thom Hall, MD [facility] Radiology Electronically Signed   By: Thom Hall M.D.   On: 06/24/2024 14:27   CT Angio Abd/Pel W and/or Wo Contrast Result Date: 06/23/2024 CLINICAL DATA:  Lower gastrointestinal bleeding. EXAM: CTA ABDOMEN AND PELVIS WITHOUT AND WITH CONTRAST TECHNIQUE: Multidetector CT imaging of the abdomen and pelvis was performed using the standard protocol during bolus administration of  intravenous contrast. Multiplanar reconstructed images and MIPs were obtained and reviewed to evaluate the vascular anatomy. RADIATION DOSE REDUCTION: This exam was performed according to the departmental dose-optimization program which includes automated exposure control, adjustment of the mA and/or kV according to patient size and/or use of iterative reconstruction technique. CONTRAST:  80mL OMNIPAQUE  IOHEXOL  350 MG/ML SOLN COMPARISON:  January 15, 2020. FINDINGS: VASCULAR Aorta: Atherosclerosis of abdominal aorta without aneurysm or dissection. Celiac: Patent without evidence of aneurysm, dissection, vasculitis or significant stenosis. SMA: Patent without evidence of aneurysm, dissection, vasculitis or significant stenosis. Renals: Both renal arteries are patent without evidence of aneurysm, dissection, vasculitis, fibromuscular dysplasia or significant stenosis. IMA: Not definitively visualized. Inflow: Patent without evidence of aneurysm, dissection, vasculitis or significant stenosis. Proximal Outflow: Bilateral common femoral and visualized portions of the superficial and profunda femoral arteries are patent without evidence of aneurysm, dissection, vasculitis or significant stenosis. Veins: No obvious venous abnormality within the limitations of this arterial phase study. Review of the MIP images confirms the above findings. NON-VASCULAR Lower chest: No acute abnormality. Hepatobiliary: No focal liver abnormality is seen. No gallstones, gallbladder wall thickening, or biliary dilatation. Pancreas: Unremarkable. No pancreatic ductal dilatation or surrounding inflammatory changes. Spleen: Normal in size without focal abnormality. Adrenals/Urinary Tract: Adrenal glands appear normal. Right renal cyst is noted. No hydronephrosis or renal obstruction is noted. Urinary bladder is unremarkable. Stomach/Bowel: The stomach is unremarkable. Status post total colectomy. Ileostomy is noted in right lower quadrant. There is  no evidence of bowel  obstruction or inflammation. Dilated segment of small bowel is seen in area of surgical anastomosis. There does appear to be accumulation of high density material in this area on later images suggesting possible small focus of gastrointestinal bleeding. This is best seen on image number 58 of series 18. Lymphatic: No adenopathy is noted. Reproductive: Status post hysterectomy. No adnexal masses. Other: Small infraumbilical ventral hernia is noted which contains a loop of small bowel, but does not result in obstruction. No ascites. Musculoskeletal: No acute or significant osseous findings. IMPRESSION: VASCULAR Probable accumulation of high density material seen in dilated segment of small bowel in pelvis near surgical anastomosis. This is concerning for possible focus of gastrointestinal bleeding. Critical Value/emergent results were called by telephone at the time of interpretation on 06/23/2024 at 12:57 pm to provider [facility] , who verbally acknowledged these results. Aortic Atherosclerosis (ICD10-I70.0). 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 Small infraumbilical ventral hernia is noted. Electronically Signed   By: Lynwood Landy Raddle M.D.   On: 06/23/2024 12:57      Subjective: Patient seen and examined at bedside today.  Hemodynamically stable.  Very comfortable today.  No episode of any bleeding through the ileostomy.  Hemoglobin stable in the range of 9.  Feels ready to go home.  Discharge Exam: Vitals:   06/28/24 2341 06/29/24 0412  BP: 128/72 (!) 133/91  Pulse: 88 70  Resp: 15 20  Temp: 98.4 F (36.9 C) 98.6 F (37 C)  SpO2: 98% 99%   Vitals:   06/28/24 1845 06/28/24 1915 06/28/24 2341 06/29/24 0412  BP: 139/84 121/61 128/72 (!) 133/91  Pulse: 70 98 88 70  Resp:  14 15 20   Temp: 97.9 F (36.6 C) 98 F (36.7 C) 98.4 F (36.9 C) 98.6 F (37 C)  TempSrc: Oral Oral Oral Oral  SpO2: 100% 99% 98% 99%  Weight:      Height:         General: Pt is alert, awake, not in acute distress Cardiovascular: RRR, S1/S2 +, no rubs, no gallops Respiratory: CTA bilaterally, no wheezing, no rhonchi Abdominal: Soft, NT, ND, bowel sounds +, ileostomy Extremities: no edema, no cyanosis    The results of significant diagnostics from this hospitalization (including imaging, microbiology, ancillary and laboratory) are listed below for reference.     Microbiology: No results found for this or any previous visit (from the past 240 hours).   Labs: BNP (last 3 results) No results for input(s): BNP in the last 8760 hours. Basic Metabolic Panel: Recent Labs  Lab 06/24/24 0618 06/25/24 0629 06/26/24 0311 06/27/24 0440 06/28/24 0419 06/29/24 0355  NA 145 138 136 138 134* 137  K 4.0 2.9* 3.2* 3.6 3.5 3.7  CL 107 103 106 107 104 104  CO2 27 25 23 24 22 25   GLUCOSE 112* 95 121* 91 92 80  BUN 25* 11 21 16 20 19   CREATININE 0.39* 0.97 0.93 1.05* 1.04* 1.10*  CALCIUM  8.1* 8.8* 8.3* 8.5* 8.5* 8.8*  MG 1.9 1.8 1.7  --   --   --   PHOS  --  3.7 3.2  --   --   --    Liver Function Tests: Recent Labs  Lab 06/23/24 1107 06/24/24 0618 06/25/24 0629  AST 20 75* 15  ALT 11 70* 10  ALKPHOS 79 124 56  BILITOT 0.6 0.4 0.8  PROT 7.2 5.1* 5.7*  ALBUMIN  3.2* 1.7* 2.8*   No results for input(s): LIPASE, AMYLASE in  the last 168 hours. No results for input(s): AMMONIA in the last 168 hours. CBC: Recent Labs  Lab 06/24/24 1826 06/25/24 0629 06/26/24 0311 06/27/24 0440 06/28/24 0419 06/29/24 0355  WBC 8.8 8.0 9.6 8.2 7.8 7.9  NEUTROABS 5.9 5.7 6.7 5.4 5.0  --   HGB 9.7* 9.8* 9.6* 9.5* 8.9* 9.4*  HCT 29.5* 29.8* 29.7* 30.0* 29.0* 29.4*  MCV 83.3 83.2 84.6 84.7 85.5 86.0  PLT 229 215 222 242 236 271   Cardiac Enzymes: No results for input(s): CKTOTAL, CKMB, CKMBINDEX, TROPONINI in the last 168 hours. BNP: Invalid input(s): POCBNP CBG: No results for input(s): GLUCAP in the last 168  hours. D-Dimer No results for input(s): DDIMER in the last 72 hours. Hgb A1c No results for input(s): HGBA1C in the last 72 hours. Lipid Profile No results for input(s): CHOL, HDL, LDLCALC, TRIG, CHOLHDL, LDLDIRECT in the last 72 hours. Thyroid  function studies No results for input(s): TSH, T4TOTAL, T3FREE, THYROIDAB in the last 72 hours.  Invalid input(s): FREET3 Anemia work up No results for input(s): VITAMINB12, FOLATE, FERRITIN, TIBC, IRON , RETICCTPCT in the last 72 hours. Urinalysis    Component Value Date/Time   COLORURINE YELLOW 03/06/2024 1020   APPEARANCEUR CLEAR 03/06/2024 1020   LABSPEC 1.015 03/06/2024 1020   PHURINE 6.0 03/06/2024 1020   GLUCOSEU NEGATIVE 03/06/2024 1020   HGBUR NEGATIVE 03/06/2024 1020   BILIRUBINUR NEGATIVE 03/06/2024 1020   KETONESUR NEGATIVE 03/06/2024 1020   PROTEINUR NEGATIVE 01/15/2020 0100   UROBILINOGEN 0.2 03/06/2024 1020   NITRITE NEGATIVE 03/06/2024 1020   LEUKOCYTESUR NEGATIVE 03/06/2024 1020   Sepsis Labs Recent Labs  Lab 06/26/24 0311 06/27/24 0440 06/28/24 0419 06/29/24 0355  WBC 9.6 8.2 7.8 7.9   Microbiology No results found for this or any previous visit (from the past 240 hours).  Please note: You were cared for by a hospitalist during your hospital stay. Once you are discharged, your primary care physician will handle any further medical issues. Please note that NO REFILLS for any discharge medications will be authorized once you are discharged, as it is imperative that you return to your primary care physician (or establish a relationship with a primary care physician if you do not have one) for your post hospital discharge needs so that they can reassess your need for medications and monitor your lab values.    Time coordinating discharge: 40 minutes  SIGNED:   Ivonne Mustache, MD  [facility] 06/29/2024, 10:16 AM Pager [phone]  If 7PM-7AM, please contact  night-coverage [email or web address] Password [email or web address]

## 2024-06-29 NOTE — Evaluation (Signed)
 Physical Therapy Evaluation Patient Details Name: AARION KITTRELL MRN: [id number] DOB: 29-Jul-1956 Today's Date: 06/29/2024  History of Present Illness  Pt is a 68 y/o female admitted on 06/23/24 after presenting with c/o bloody output from ileostomy. CT abdomen/pelvis showed accumulation of high density material and dilated segment of small bowel near the surgical anastomotic site; suspected GI bleed. After admission, pt became unresponsive & hypotensive. Pt is being treated for hemorrhagic shock/syncope 2/2 recurrent upper GI bleed in the setting of anticoagulation. Pt also found to have PE on 06/26/24. IR put IVC filter on 8/20.  PMH: ulcerative pancolitis s/p colectomy, postoperative fistulas, SBO, PE/DVT on eliquis  .  Clinical Impression  Patient evaluated by Physical Therapy with no further acute PT needs identified. All education has been completed and the patient has no further questions. Pt ambulated in hallway and practiced full flight of stairs.  Pt feels ready for d/c home today. No further follow-up Physical Therapy or equipment needs. PT is signing off. Thank you for this referral.         If plan is discharge home, recommend the following:     Can travel by private vehicle        Equipment Recommendations None recommended by PT  Recommendations for Other Services       Functional Status Assessment Patient has not had a recent decline in their functional status     Precautions / Restrictions Precautions Precautions: None      Mobility  Bed Mobility Overal bed mobility: Independent                  Transfers Overall transfer level: Independent                      Ambulation/Gait Ambulation/Gait assistance: Modified independent (Device/Increase time) Gait Distance (Feet): 800 Feet Assistive device: None Gait Pattern/deviations: WFL(Within Functional Limits)       General Gait Details: slow but steady pace, reports tight lower back, states  ambulating helps, denies any other symptoms during mobilizing  Stairs Stairs: Yes Stairs assistance: Contact guard assist, Supervision Stair Management: Alternating pattern, Forwards, One rail Right Number of Stairs: 20 General stair comments: utilized right rail, slow but steady, no physical assist required  Wheelchair Mobility     Tilt Bed    Modified Rankin (Stroke Patients Only)       Balance Overall balance assessment: No apparent balance deficits (not formally assessed)                                           Pertinent Vitals/Pain Pain Assessment Pain Assessment: Faces Faces Pain Scale: Hurts little more Pain Location: low back Pain Descriptors / Indicators: Spasm, Tightness Pain Intervention(s): Repositioned, Monitored during session, Heat applied (RN aware, okay with hot packs)    Home Living Family/patient expects to be discharged to:: Private residence Living Arrangements: Spouse/significant other;Other relatives Available Help at Discharge: Family Type of Home: House       Alternate Level Stairs-Number of Steps: flight Home Layout: Two level Home Equipment: None      Prior Function Prior Level of Function : Independent/Modified Independent                     Extremity/Trunk Assessment        Lower Extremity Assessment Lower Extremity Assessment: Overall WFL for tasks assessed  Cervical / Trunk Assessment Cervical / Trunk Assessment: Normal  Communication   Communication Communication: No apparent difficulties    Cognition Arousal: Alert Behavior During Therapy: WFL for tasks assessed/performed   PT - Cognitive impairments: No apparent impairments                         Following commands: Intact       Cueing       General Comments      Exercises     Assessment/Plan    PT Assessment Patient does not need any further PT services  PT Problem List         PT Treatment  Interventions      PT Goals (Current goals can be found in the Care Plan section)  Acute Rehab PT Goals PT Goal Formulation: All assessment and education complete, DC therapy    Frequency       Co-evaluation               AM-PAC PT 6 Clicks Mobility  Outcome Measure Help needed turning from your back to your side while in a flat bed without using bedrails?: None Help needed moving from lying on your back to sitting on the side of a flat bed without using bedrails?: None Help needed moving to and from a bed to a chair (including a wheelchair)?: None Help needed standing up from a chair using your arms (e.g., wheelchair or bedside chair)?: None Help needed to walk in hospital room?: None Help needed climbing 3-5 steps with a railing? : None 6 Click Score: 24    End of Session   Activity Tolerance: Patient tolerated treatment well Patient left: in chair;with call bell/phone within reach Nurse Communication: Mobility status PT Visit Diagnosis: Difficulty in walking, not elsewhere classified (R26.2)    Time: 8977-8964 PT Time Calculation (min) (ACUTE ONLY): 13 min   Charges:   PT Evaluation $PT Eval Low Complexity: 1 Low   PT General Charges $$ ACUTE PT VISIT: 1 Visit       Tari KLEIN, DPT Physical Therapist Acute Rehabilitation Services Office: [phone]   Tari CROME [place] 06/29/2024, 11:56 AM

## 2024-06-30 ENCOUNTER — Telehealth: Payer: Self-pay | Admitting: *Deleted

## 2024-06-30 ENCOUNTER — Ambulatory Visit (INDEPENDENT_AMBULATORY_CARE_PROVIDER_SITE_OTHER): Admitting: Internal Medicine

## 2024-06-30 ENCOUNTER — Ambulatory Visit: Payer: Self-pay

## 2024-06-30 ENCOUNTER — Encounter: Payer: Self-pay | Admitting: Internal Medicine

## 2024-06-30 VITALS — BP 144/84 | HR 75 | Temp 98.2°F | Ht 63.0 in | Wt 172.6 lb

## 2024-06-30 DIAGNOSIS — R7303 Prediabetes: Secondary | ICD-10-CM | POA: Diagnosis not present

## 2024-06-30 DIAGNOSIS — R21 Rash and other nonspecific skin eruption: Secondary | ICD-10-CM | POA: Diagnosis not present

## 2024-06-30 DIAGNOSIS — I10 Essential (primary) hypertension: Secondary | ICD-10-CM | POA: Diagnosis not present

## 2024-06-30 DIAGNOSIS — S301XXA Contusion of abdominal wall, initial encounter: Secondary | ICD-10-CM | POA: Insufficient documentation

## 2024-06-30 MED ORDER — PREDNISONE 10 MG PO TABS: ORAL_TABLET | ORAL | 0 refills | Status: AC

## 2024-06-30 NOTE — Progress Notes (Addendum)
 Patient ID: Latasha Wood, female   DOB: Jan 21, 1956, 68 y.o.   MRN: [id number]        Chief Complaint: follow up rash, right groin pain and swelling, preDM, htn       HPI:  Latasha Wood is a 68 y.o. female here with c/o new onset since yesterday right groin pain, bruising and swelling without fever, chills.  Pt has complicated recent hx of 2 hospns for GI bleeding, the first complicated by a PT.  After second very severe episode, eliquis  was discontinued and IVC placed through right jugular, but had previous right groin arterial puncture as well for GI bleeding treatment purpose.  Seemed to do well enough post procedure and was discharged only yesterday, but in the last day has noticed large bruising and firm area of swelling near the puncture site.  No other RLE symptoms to suggest neurovasc compromise.  Pain worse to lying down or sitting up.  No other overt bleeding bruising.  Does also incidentally have rather large very itchy rash to right upper chest where apprarently she has a rather vigourous skin reaction to dressing and adhesive placed after IVC placement.  Pt denies polydipsia, polyuria, or new focal neuro s/s.   Pt declines suggestion to present to ED for urgent cbc or imaging follow up for possible right groin hematoma and/or pseudoaneurysm        Wt Readings from Last 3 Encounters:  06/30/24 172 lb 9.6 oz (78.3 kg)  06/24/24 169 lb 1.5 oz (76.7 kg)  06/22/24 169 lb (76.7 kg)   BP Readings from Last 3 Encounters:  06/30/24 (!) 144/84  06/29/24 (!) 133/91  06/22/24 129/77         Past Medical History:  Diagnosis Date   Anemia    Anxiety    Arthritis    Blood transfusion    Bowel obstruction (HCC)    Colitis, ulcerative (HCC)    Colon polyps    DVT (deep venous thrombosis) (HCC)    Hyperlipidemia    Hypertension    Ileostomy present (HCC)    Kidney disease    Numbness and tingling    hands and feet bilat    Pulmonary embolism (HCC)    Shortness of breath  dyspnea    talking or walking    Type 2 diabetes mellitus with complication, without long-term current use of insulin  (HCC) 09/05/2015   currently on no medications, (05/26/2016) pt denies diabetes.  States that she had one time high blood sugars d/t prednisone    Past Surgical History:  Procedure Laterality Date   ABDOMINAL HYSTERECTOMY  2000   ABDOMINAL SURGERY     BREAST CYST ASPIRATION     CATARACT EXTRACTION W/PHACO Right 10/20/2017   Procedure: CATARACT EXTRACTION PHACO AND INTRAOCULAR LENS PLACEMENT (IOC);  Surgeon: Myrna Adine Anes, MD;  Location: [facility];  Service: Ophthalmology;  Laterality: Right;  US  00:50.1 AP% 13.2 CDE 6.61 Fluid Pack lot # [id number]   COLON SURGERY     colostomy   cortisol shot     in left shoulder   DILATION AND CURETTAGE OF UTERUS     most likely after miscarriage   ESOPHAGOGASTRODUODENOSCOPY N/A 05/03/2024   Procedure: EGD (ESOPHAGOGASTRODUODENOSCOPY);  Surgeon: Charlanne Groom, MD;  Location: [facility];  Service: Gastroenterology;  Laterality: N/A;   fibrocystic breast disease     q 6 month mammogram   gravida 6 para 2     all SAB   hyadiform mole  ILEO LOOP DIVERSION N/A 09/25/2015   Procedure: ILEO LOOP COLOSTOMY;  Surgeon: Bernarda Ned, MD;  Location: [facility];  Service: General;  Laterality: N/A;   ILEOSCOPY N/A 06/25/2024   Procedure: ILEOSCOPY, THROUGH STOMA;  Surgeon: Stacia Glendia BRAVO, MD;  Location: [facility];  Service: Gastroenterology;  Laterality: N/A;   ILEOSTOMY CLOSURE N/A 10/08/2015   Procedure: ILEOSTOMY REVISION;  Surgeon: Bernarda Ned, MD;  Location: [facility];  Service: General;  Laterality: N/A;   ILEOSTOMY CLOSURE N/A 08/20/2016   Procedure: OPEN RELOCATION OF ILEOSTOMY;  Surgeon: Bernarda Ned, MD;  Location: [facility];  Service: General;  Laterality: N/A;   IR ANGIOGRAM SELECTIVE EACH ADDITIONAL VESSEL  06/23/2024   IR ANGIOGRAM SELECTIVE EACH ADDITIONAL VESSEL  06/23/2024   IR ANGIOGRAM VISCERAL SELECTIVE  06/23/2024    IR GENERIC HISTORICAL  09/01/2016   IR US  GUIDE VASC ACCESS RIGHT 09/01/2016 Franky Rusk, PA-C [facility]   IR GENERIC HISTORICAL  09/01/2016   IR FLUORO GUIDE CV LINE RIGHT 09/01/2016 Franky Rusk, PA-C [facility]   IR IVC FILTER PLMT / S&I PORTER GUID/MOD SED  06/28/2024   IR TUNNELED CENTRAL VENOUS CATH PLC W IMG  06/28/2024   IR US  GUIDE VASC ACCESS RIGHT  06/23/2024   KNEE ARTHROSCOPY W/ MENISCAL REPAIR     right knee ([place]orf)   LAPAROSCOPIC SMALL BOWEL RESECTION N/A 09/25/2015   Procedure: LAPAROSCOPIC BOWEL RESECTION TIMES TWO;  Surgeon: Bernarda Ned, MD;  Location: [facility];  Service: General;  Laterality: N/A;   ROBOTIC ASSISTED LAPAROSCOPIC LYSIS OF ADHESION N/A 09/25/2015   Procedure: XI ROBOTIC ASSISTED LAPAROSCOPIC LYSIS OF ADHESION;  Surgeon: Bernarda Ned, MD;  Location: [facility];  Service: General;  Laterality: N/A;  90 minutes   Small bowel obstruction     SMART PILL PROCEDURE  06/25/2024   Procedure: CAPSULE ENDOSCOPY, PATENCY;  Surgeon: Stacia Glendia BRAVO, MD;  Location: [facility];  Service: Gastroenterology;;    reports that she has never smoked. She has never been exposed to tobacco smoke. She has never used smokeless tobacco. She reports current alcohol use of about 2.0 standard drinks of alcohol per week. She reports that she does not use drugs. family history includes Breast cancer in her maternal aunt; Breast cancer (age of onset: 1) in her sister; Breast cancer (age of onset: 40) in her mother; Colon cancer in her paternal aunt; Diabetes in her brother, father, maternal grandmother, mother, sister, and sister; Diverticulosis in her brother and sister; HIV in her father; Heart disease in her father; Hyperlipidemia in her daughter; Irritable bowel syndrome in her sister; Throat cancer in her sister. Allergies  Allergen Reactions   Betadine  [Povidone Iodine ] Rash and Other (See Comments)    The skin is very sensitive   Latex Rash and Other (See Comments)     Redness of skin   Tape Rash and Other (See Comments)    Redness of skin OK with hypofix tape and paper tape! NO transpore tape adhesive tape!   Current Outpatient Medications on File Prior to Visit  Medication Sig Dispense Refill   ALPRAZolam  (XANAX ) 0.25 MG tablet Take 1 tablet (0.25 mg total) by mouth 2 (two) times daily as needed for anxiety. 180 tablet 0   [Paused] apixaban  (ELIQUIS ) 5 MG TABS tablet Take 1 tablet (5 mg total) by mouth 2 (two) times daily. 360 tablet 0   augmented betamethasone dipropionate (DIPROLENE-AF) 0.05 % cream Apply 1 Application topically 2 (two) times daily as needed (Dermatitis).  cholecalciferol (VITAMIN D3) 25 MCG (1000 UNIT) tablet Take 1,000 Units by mouth daily.     hydrocortisone  2.5 % cream APPLY TOPICALLY TWICE DAILY (Patient taking differently: Apply 1 Application topically 2 (two) times daily as needed (Dermatitis).) 120 g 0   indapamide  (LOZOL ) 1.25 MG tablet TAKE 1 TABLET BY MOUTH DAILY 100 tablet 2   losartan  (COZAAR ) 50 MG tablet Take 1 tablet (50 mg total) by mouth daily. 90 tablet 1   Multiple Vitamin (MULTIVITAMIN WITH MINERALS) TABS tablet Take 1 tablet by mouth daily with breakfast.     nystatin  cream (MYCOSTATIN ) Apply 1 Application topically 2 (two) times daily. (Patient taking differently: Apply 1 Application topically 2 (two) times daily as needed for dry skin.) 60 g 1   pantoprazole  (PROTONIX ) 40 MG tablet Take 1 tablet (40 mg total) by mouth daily. 90 tablet 3   potassium chloride  (KLOR-CON ) 10 MEQ tablet TAKE 1 TABLET BY MOUTH 3 TIMES  DAILY 270 tablet 0   No current facility-administered medications on file prior to visit.        ROS:  All others reviewed and negative.  Objective        PE:  BP (!) 144/84   Pulse 75   Temp 98.2 F (36.8 C) (Oral)   Ht 5' 3 (1.6 m)   Wt 172 lb 9.6 oz (78.3 kg)   SpO2 97%   BMI 30.57 kg/m                 Constitutional: Pt appears in discomfort with rash and groin, non toxic                HENT: Head: NCAT.                Right Ear: External ear normal.                 Left Ear: External ear normal.                Eyes: . Pupils are equal, round, and reactive to light. Conjunctivae and EOM are normal               Nose: without d/c or deformity               Neck: Neck supple. Gross normal ROM               Cardiovascular: Normal rate and regular rhythm.                 Pulmonary/Chest: Effort normal and breath sounds without rales or wheezing.                Abd:  Soft, NT, ND, + BS, no organomegaly               Neurological: Pt is alert. At baseline orientation, motor grossly intact               Skin: Skin is warm., LE edema - none, large erythem itchy area about 10 x 8 cm right upper chest, non fluctuance , no abscess;  right groin puncture area with 4 x 4 cm area bruising and underlying firmness and swelling but non fluctuant               Psychiatric: Pt behavior is normal without agitation   Micro: none  Cardiac tracings I have personally interpreted today:  none  Pertinent Radiological findings (summarize): none   Lab Results  Component Value  Date   WBC 7.9 06/29/2024   HGB 9.4 (L) 06/29/2024   HCT 29.4 (L) 06/29/2024   PLT 271 06/29/2024   GLUCOSE 80 06/29/2024   CHOL 171 03/06/2024   TRIG 68.0 03/06/2024   HDL 110.00 03/06/2024   LDLDIRECT 114.0 04/29/2017   LDLCALC 48 03/06/2024   ALT 10 06/25/2024   AST 15 06/25/2024   NA 137 06/29/2024   K 3.7 06/29/2024   CL 104 06/29/2024   CREATININE 1.10 (H) 06/29/2024   BUN 19 06/29/2024   CO2 25 06/29/2024   TSH 1.00 03/06/2024   INR 1.0 06/28/2024   HGBA1C 5.6 04/21/2024   MICROALBUR 0.7 03/06/2024   Assessment/Plan:  Latasha Wood is a 68 y.o. Black or African American [2] female with  has a past medical history of Anemia, Anxiety, Arthritis, Blood transfusion, Bowel obstruction (HCC), Colitis, ulcerative (HCC), Colon polyps, DVT (deep venous thrombosis) (HCC), Hyperlipidemia, Hypertension,  Ileostomy present (HCC), Kidney disease, Numbness and tingling, Pulmonary embolism (HCC), Shortness of breath dyspnea, and Type 2 diabetes mellitus with complication, without long-term current use of insulin  (HCC) (09/05/2015).  Rash Pt with large rash to right upper chest under the post IVC procedure dressing and adhesive; pt to have dressing changed today, also prednisone  taper and to continue the betamethason cream she already has been trying  Essential hypertension BP Readings from Last 3 Encounters:  06/30/24 (!) 144/84  06/29/24 (!) 133/91  06/22/24 129/77   Uncontrolled, likely reactive,  pt to continue medical treatment lozol  1.25 mg every day, losartan  50 mg every day as declines other change today   Prediabetes Lab Results  Component Value Date   HGBA1C 5.6 04/21/2024   Stable, pt to continue current medical treatment  - diet, wt control   Groin hematoma, initial encounter Pt with likely post procedure hematoma moderate sized, leg o/w neurovasc intact, declines cbc ior ED referral as she just left the hospital yesterday, will try to order urgent right groin u/s r/o pseudoanuersym, and pt given ED precautions for any worsening s/s;  pt agrees to return to see PCP in 3 days  Followup: Return in about 3 days (around 07/03/2024) for to Dr Joshua.  Lynwood Rush, MD 06/30/2024 8:35 PM [facility]

## 2024-06-30 NOTE — Telephone Encounter (Signed)
 Received vm message from pt stating that she was discharged from the hospital yesterday. Had complicated in-patient stay. She states she had an IVC filter placed during this stay, due to new PE, Off eliquis  due to GI bleeding. She c/o of rashes, pain in groinn and numbness in her right leg. She wasn't sure who to call about these issues. Please advise

## 2024-06-30 NOTE — Patient Instructions (Signed)
 Please take all new medication as prescribed - the prednisone   Please continue all other medications as before, and refills have been done if requested.  Please have the pharmacy call with any other refills you may need.  Please keep your appointments with your specialists as you may have planned  You will be contacted regarding the referral for: Right groin ultrasound to check for aneurysm and bleeding  Please see Dr Joshua on Monday

## 2024-06-30 NOTE — Assessment & Plan Note (Signed)
 Lab Results  Component Value Date   HGBA1C 5.6 04/21/2024   Stable, pt to continue current medical treatment  - diet, wt control

## 2024-06-30 NOTE — Assessment & Plan Note (Addendum)
 BP Readings from Last 3 Encounters:  06/30/24 (!) 144/84  06/29/24 (!) 133/91  06/22/24 129/77   Uncontrolled, likely reactive,  pt to continue medical treatment lozol  1.25 mg every day, losartan  50 mg every day as declines other change today

## 2024-06-30 NOTE — Assessment & Plan Note (Addendum)
 Pt with likely post procedure hematoma moderate sized, leg o/w neurovasc intact, declines cbc ior ED referral as she just left the hospital yesterday, will try to order urgent right groin u/s r/o pseudoanuersym, and pt given ED precautions for any worsening s/s;  pt agrees to return to see PCP in 3 days

## 2024-06-30 NOTE — Telephone Encounter (Signed)
 FYI Only or Action Required?: Action required by provider: request for appointment.  Patient was last seen in primary care on 05/22/2024 by Joshua Debby CROME, MD.  Called Nurse Triage reporting Rash.  Symptoms began yesterday.  Interventions attempted: OTC medications: Tylenol , hydrocortisone  .  Symptoms are: gradually worsening. Pt. Recently had filter placements for PE and recently hospitalized with GI bleed. Rash is painful. States they entered groin and jugular for filter placement. These area are broken out in rash.  Triage Disposition: See Physician Within 24 Hours  Patient/caregiver understands and will follow disposition?: Yes   Copied from CRM #[id number]. Topic: Clinical - Red Word Triage >> Jun 30, 2024  2:19 PM Gennette ORN wrote: Red Word that prompted transfer to Nurse Triage: Patient just had IVC procedure done on 8/20.Patient has a painful rash on chest , neck ,and the upper part of body and pain on right hip side. Reason for Disposition  SEVERE itching (i.e., interferes with sleep, normal activities or school)  Answer Assessment - Initial Assessment Questions 1. APPEARANCE of RASH: What does the rash look like? (e.g., blisters, dry flaky skin, red spots, redness, sores)     Red bumps, small 2. SIZE: How big are the spots? (e.g., tip of pen, eraser, coin; inches, centimeters)     small 3. LOCATION: Where is the rash located?     Right shoulder, arm. Both groin areas 4. COLOR: What color is the rash? (Note: It is difficult to assess rash color in people with darker-colored skin. When this situation occurs, simply ask the caller to describe what they see.)     red 5. ONSET: When did the rash begin?     yesterday 6. FEVER: Do you have a fever? If Yes, ask: What is your temperature, how was it measured, and when did it start?     no 7. ITCHING: Does the rash itch? If Yes, ask: How bad is the itch? (Scale 1-10; or mild, moderate, severe)     mild 8. CAUSE:  What do you think is causing the rash?     unsure 9. MEDICINE FACTORS: Have you started any new medicines within the last 2 weeks? (e.g., antibiotics)      no 10. OTHER SYMPTOMS: Do you have any other symptoms? (e.g., dizziness, headache, sore throat, joint pain)       no 11. PREGNANCY: Is there any chance you are pregnant? When was your last menstrual period?       no  Protocols used: Rash or Redness - [facility]

## 2024-06-30 NOTE — Assessment & Plan Note (Signed)
 Pt with large rash to right upper chest under the post IVC procedure dressing and adhesive; pt to have dressing changed today, also prednisone  taper and to continue the betamethason cream she already has been trying

## 2024-07-01 ENCOUNTER — Encounter (HOSPITAL_COMMUNITY): Payer: Self-pay

## 2024-07-01 ENCOUNTER — Other Ambulatory Visit: Payer: Self-pay

## 2024-07-01 ENCOUNTER — Emergency Department (HOSPITAL_COMMUNITY)
Admission: EM | Admit: 2024-07-01 | Discharge: 2024-07-01 | Disposition: A | Discharge status: Home / Self Care | Attending: Emergency Medicine | Admitting: Emergency Medicine

## 2024-07-01 ENCOUNTER — Emergency Department (HOSPITAL_COMMUNITY)

## 2024-07-01 DIAGNOSIS — Z7901 Long term (current) use of anticoagulants: Secondary | ICD-10-CM | POA: Diagnosis not present

## 2024-07-01 DIAGNOSIS — R1031 Right lower quadrant pain: Secondary | ICD-10-CM | POA: Diagnosis not present

## 2024-07-01 DIAGNOSIS — Z91040 Latex allergy status: Secondary | ICD-10-CM | POA: Diagnosis not present

## 2024-07-01 DIAGNOSIS — S301XXA Contusion of abdominal wall, initial encounter: Secondary | ICD-10-CM | POA: Diagnosis not present

## 2024-07-01 DIAGNOSIS — T148XXA Other injury of unspecified body region, initial encounter: Secondary | ICD-10-CM

## 2024-07-01 DIAGNOSIS — M25551 Pain in right hip: Secondary | ICD-10-CM | POA: Diagnosis present

## 2024-07-01 DIAGNOSIS — R1030 Lower abdominal pain, unspecified: Secondary | ICD-10-CM | POA: Diagnosis not present

## 2024-07-01 DIAGNOSIS — I724 Aneurysm of artery of lower extremity: Secondary | ICD-10-CM

## 2024-07-01 DIAGNOSIS — M7981 Nontraumatic hematoma of soft tissue: Secondary | ICD-10-CM | POA: Insufficient documentation

## 2024-07-01 DIAGNOSIS — K8689 Other specified diseases of pancreas: Secondary | ICD-10-CM | POA: Diagnosis not present

## 2024-07-01 LAB — I-STAT CHEM 8, ED
BUN: 10 mg/dL (ref 8–23)
Calcium, Ion: 1.29 mmol/L (ref 1.15–1.40)
Chloride: 107 mmol/L (ref 98–111)
Creatinine, Ser: 1.1 mg/dL — ABNORMAL HIGH (ref 0.44–1.00)
Glucose, Bld: 112 mg/dL — ABNORMAL HIGH (ref 70–99)
HCT: 32 % — ABNORMAL LOW (ref 36.0–46.0)
Hemoglobin: 10.9 g/dL — ABNORMAL LOW (ref 12.0–15.0)
Potassium: 4.2 mmol/L (ref 3.5–5.1)
Sodium: 138 mmol/L (ref 135–145)
TCO2: 23 mmol/L (ref 22–32)

## 2024-07-01 LAB — CBC WITH DIFFERENTIAL/PLATELET
Abs Immature Granulocytes: 0.12 K/uL — ABNORMAL HIGH (ref 0.00–0.07)
Basophils Absolute: 0 K/uL (ref 0.0–0.1)
Basophils Relative: 0 %
Eosinophils Absolute: 0 K/uL (ref 0.0–0.5)
Eosinophils Relative: 0 %
HCT: 32.5 % — ABNORMAL LOW (ref 36.0–46.0)
Hemoglobin: 10.2 g/dL — ABNORMAL LOW (ref 12.0–15.0)
Immature Granulocytes: 1 %
Lymphocytes Relative: 12 %
Lymphs Abs: 1.4 K/uL (ref 0.7–4.0)
MCH: 26.8 pg (ref 26.0–34.0)
MCHC: 31.4 g/dL (ref 30.0–36.0)
MCV: 85.3 fL (ref 80.0–100.0)
Monocytes Absolute: 0.6 K/uL (ref 0.1–1.0)
Monocytes Relative: 6 %
Neutro Abs: 9.4 K/uL — ABNORMAL HIGH (ref 1.7–7.7)
Neutrophils Relative %: 81 %
Platelets: 338 K/uL (ref 150–400)
RBC: 3.81 MIL/uL — ABNORMAL LOW (ref 3.87–5.11)
RDW: 15 % (ref 11.5–15.5)
WBC: 11.6 K/uL — ABNORMAL HIGH (ref 4.0–10.5)
nRBC: 0 % (ref 0.0–0.2)

## 2024-07-01 LAB — BASIC METABOLIC PANEL WITH GFR
Anion gap: 8 (ref 5–15)
BUN: 13 mg/dL (ref 8–23)
CO2: 22 mmol/L (ref 22–32)
Calcium: 9.3 mg/dL (ref 8.9–10.3)
Chloride: 107 mmol/L (ref 98–111)
Creatinine, Ser: 1.05 mg/dL — ABNORMAL HIGH (ref 0.44–1.00)
GFR, Estimated: 58 mL/min — ABNORMAL LOW (ref >60)
Glucose, Bld: 114 mg/dL — ABNORMAL HIGH (ref 70–99)
Potassium: 4.1 mmol/L (ref 3.5–5.1)
Sodium: 137 mmol/L (ref 135–145)

## 2024-07-01 MED ORDER — MORPHINE SULFATE (PF) 4 MG/ML IV SOLN
4.0000 mg | Freq: Once | INTRAVENOUS | Status: AC
  Administered 2024-07-01: 4 mg via INTRAVENOUS
  Filled 2024-07-01: qty 1

## 2024-07-01 MED ORDER — DICLOFENAC SODIUM 1 % EX GEL: 4.0000 g | Freq: Four times a day (QID) | CUTANEOUS | 0 refills | Status: DC

## 2024-07-01 MED ORDER — IOHEXOL 300 MG/ML  SOLN
100.0000 mL | Freq: Once | INTRAMUSCULAR | Status: AC | PRN
  Administered 2024-07-01: 100 mL via INTRAVENOUS

## 2024-07-01 MED ORDER — ONDANSETRON HCL 4 MG/2ML IJ SOLN
4.0000 mg | Freq: Once | INTRAMUSCULAR | Status: AC
  Administered 2024-07-01: 4 mg via INTRAVENOUS
  Filled 2024-07-01: qty 2

## 2024-07-01 MED ORDER — MORPHINE SULFATE 15 MG PO TABS: 7.5000 mg | ORAL_TABLET | ORAL | 0 refills | Status: DC | PRN

## 2024-07-01 NOTE — Discharge Instructions (Signed)
 Follow up with your family doc in the office.   Use the gel as prescribed. Also take tylenol  1000mg (2 extra strength) four times a day.    Then take the pain medicine if you feel like you need it. Narcotics do not help with the pain, they only make you care about it less.  You can become addicted to this, people may break into your house to steal it.  It will constipate you.  If you drive under the influence of this medicine you can get a DUI.

## 2024-07-01 NOTE — Progress Notes (Signed)
 VASCULAR LAB    Ultrasound of right groin to rule out pseudoaneurysm has been performed.  See CV proc for preliminary results.  Verbal report given to Dr. Emil LIS, [facility], RVT 07/01/2024, 2:18 PM

## 2024-07-01 NOTE — ED Provider Notes (Signed)
 [facility] AT [facility] Provider Note   CSN: [id number] Arrival date & time: 07/01/24  9187     Patient presents with: Groin Pain   Latasha Wood is a 68 y.o. female.   68 yo F with a chief complaints of right hip pain.  Going on for a couple days now.  She was just in the hospital for a week for a GI bleed.  She had a right femoral access for possible IR embolization.  Did not have any significant discomfort immediately afterwards.  She is not sure what day that was performed.  She had a video visit with her physician who wanted her to come to the ED for ultrasound.  Pain worse this morning.  Radiates into her buttock.  No fevers.  Has had some output to her ostomy.  No vomiting no fevers.   Groin Pain       Prior to Admission medications   Medication Sig Start Date End Date Taking? Authorizing Provider  diclofenac  Sodium (VOLTAREN ) 1 % GEL Apply 4 g topically 4 (four) times daily. 07/01/24  Yes Emil Share, DO  morphine  (MSIR) 15 MG tablet Take 0.5 tablets (7.5 mg total) by mouth every 4 (four) hours as needed for severe pain (pain score 7-10). 07/01/24  Yes Emil Share, DO  ALPRAZolam  (XANAX ) 0.25 MG tablet Take 1 tablet (0.25 mg total) by mouth 2 (two) times daily as needed for anxiety. 03/06/24   Joshua Debby CROME, MD  apixaban  (ELIQUIS ) 5 MG TABS tablet Take 1 tablet (5 mg total) by mouth 2 (two) times daily. 05/02/24   Thayil, Irene T, PA-C  augmented betamethasone dipropionate (DIPROLENE-AF) 0.05 % cream Apply 1 Application topically 2 (two) times daily as needed (Dermatitis). 06/01/24   [provider]  cholecalciferol (VITAMIN D3) 25 MCG (1000 UNIT) tablet Take 1,000 Units by mouth daily.    [provider]  hydrocortisone  2.5 % cream APPLY TOPICALLY TWICE DAILY Patient taking differently: Apply 1 Application topically 2 (two) times daily as needed (Dermatitis). 05/17/24   Joshua Debby CROME, MD  indapamide  (LOZOL ) 1.25 MG  tablet TAKE 1 TABLET BY MOUTH DAILY 05/17/24   Joshua Debby CROME, MD  losartan  (COZAAR ) 50 MG tablet Take 1 tablet (50 mg total) by mouth daily. 05/22/24   Joshua Debby CROME, MD  Multiple Vitamin (MULTIVITAMIN WITH MINERALS) TABS tablet Take 1 tablet by mouth daily with breakfast.    [provider]  nystatin  cream (MYCOSTATIN ) Apply 1 Application topically 2 (two) times daily. Patient taking differently: Apply 1 Application topically 2 (two) times daily as needed for dry skin. 04/24/24   [place], PA-C  pantoprazole  (PROTONIX ) 40 MG tablet Take 1 tablet (40 mg total) by mouth daily. 05/16/24   [place], PA-C  potassium chloride  (KLOR-CON ) 10 MEQ tablet TAKE 1 TABLET BY MOUTH 3 TIMES  DAILY 05/17/24   Joshua Debby CROME, MD  predniSONE  (DELTASONE ) 10 MG tablet 3 tabs by mouth per day for 3 days,2tabs per day for 3 days,1tab per day for 3 days 06/30/24   Norleen Lynwood ORN, MD    Allergies: Betadine  [povidone iodine ], Latex, and Tape    Review of Systems  Updated Vital Signs BP (!) 144/72 (BP Location: Right Arm)   Pulse 63   Temp 97.8 F (36.6 C) (Oral)   Resp 18   Ht 5' 3 (1.6 m)   Wt 81.6 kg   SpO2 100%   BMI 31.87 kg/m   Physical  Exam Vitals and nursing note reviewed.  Constitutional:      General: She is not in acute distress.    Appearance: She is well-developed. She is not diaphoretic.  HENT:     Head: Normocephalic and atraumatic.  Eyes:     Pupils: Pupils are equal, round, and reactive to light.  Cardiovascular:     Rate and Rhythm: Normal rate and regular rhythm.     Heart sounds: No murmur heard.    No friction rub. No gallop.  Pulmonary:     Effort: Pulmonary effort is normal.     Breath sounds: No wheezing or rales.  Abdominal:     General: There is no distension.     Palpations: Abdomen is soft.     Tenderness: There is no abdominal tenderness.  Musculoskeletal:        General: No tenderness.     Cervical back: Normal range of motion and neck supple.      Comments: Patient has a small amount of bruising over the right femoral site.  I do not appreciate any obvious significant palpable hematoma.  No significant pain to the right lower quadrant.  Skin:    General: Skin is warm and dry.  Neurological:     Mental Status: She is alert and oriented to person, place, and time.  Psychiatric:        Behavior: Behavior normal.     (all labs ordered are listed, but only abnormal results are displayed) Labs Reviewed  CBC WITH DIFFERENTIAL/PLATELET - Abnormal; Notable for the following components:      Result Value   WBC 11.6 (*)    RBC 3.81 (*)    Hemoglobin 10.2 (*)    HCT 32.5 (*)    Neutro Abs 9.4 (*)    Abs Immature Granulocytes 0.12 (*)    All other components within normal limits  BASIC METABOLIC PANEL WITH GFR - Abnormal; Notable for the following components:   Glucose, Bld 114 (*)    Creatinine, Ser 1.05 (*)    GFR, Estimated 58 (*)    All other components within normal limits  I-STAT CHEM 8, ED - Abnormal; Notable for the following components:   Creatinine, Ser 1.10 (*)    Glucose, Bld 112 (*)    Hemoglobin 10.9 (*)    HCT 32.0 (*)    All other components within normal limits    EKG: None  Radiology: CT ABDOMEN PELVIS W CONTRAST Result Date: 07/01/2024 EXAM: CT ABDOMEN AND PELVIS WITH CONTRAST 07/01/2024 09:30:56 AM TECHNIQUE: CT of the abdomen and pelvis was performed with the administration of intravenous contrast. Multiplanar reformatted images are provided for review. Automated exposure control, iterative reconstruction, and/or weight-based adjustment of the mA/kV was utilized to reduce the radiation dose to as low as reasonably achievable. COMPARISON: 06/23/2024 CLINICAL HISTORY: Abdominal pain, acute, nonlocalized. Per triage notes: Pt coming from home for possible groin hematoma per PCP visit yesterday. Pt had previous R groin arterial puncture in recent hospitalization. Pt having severe groin pain that has worsened today.  FINDINGS: LOWER CHEST: Visualized lung bases are clear. LIVER: Normal size and contour. GALLBLADDER AND BILE DUCTS: No wall thickening. No cholelithiasis. No biliary ductal dilatation. SPLEEN: No splenic staple lines in the pelvis. PANCREAS: Stable mild pancreatic ductal dilatation without evident lesion or regional inflammatory change. ADRENAL GLANDS: Normal appearance. No mass. KIDNEYS, URETERS AND BLADDER: 2.2 cm 15 HU renal cortical cyst right mid kidney. Per consensus, no follow-up is needed for simple Bosniak  type 1 and 2 renal cysts, unless the patient has a malignancy history or risk factors. No stones in the kidneys or ureters. No hydronephrosis. No perinephric or periureteral stranding. Urinary bladder is unremarkable. GI AND BOWEL: Stomach demonstrates no acute abnormality. There is no bowel obstruction. No bowel wall thickening. Right lower quadrant ostomy. Infraumbilical rectus diastasis with a broad hernia containing a loop of small bowel, without obstruction or strangulation. PERITONEUM AND RETROPERITONEUM: No ascites. No free air. VASCULATURE: Retrievable infrarenal IVC filter in good location. Aorta is normal in caliber. LYMPH NODES: No lymphadenopathy. REPRODUCTIVE ORGANS: No significant abnormality. BONES AND SOFT TISSUES: Vertebral endplate spurring at multiple levels in the lower thoracic spine. Facet DJD L4 - S1. Right hip DJD. Mild inflammatory/ infiltrative changes overlying the right common femoral vessels presumably from venopuncture 06/28/24. No discrete hematoma or active extravasation. IMPRESSION: 1. Mild inflammatory changes overlying the right common femoral vessels, presumably from venous puncture 06/28/24. No discrete hematoma or active extravasation. 2. Infraumbilical rectus diastasis with a broad hernia containing a loop of small bowel, without obstruction or strangulation. Electronically signed by: Dayne Hassell MD 07/01/2024 10:36 AM EDT RP Workstation: [id number]      Procedures   Medications Ordered in the ED  morphine  (PF) 4 MG/ML injection 4 mg (4 mg Intravenous Given 07/01/24 0850)  ondansetron  (ZOFRAN ) injection 4 mg (4 mg Intravenous Given 07/01/24 0851)  iohexol  (OMNIPAQUE ) 300 MG/ML solution 100 mL (100 mLs Intravenous Contrast Given 07/01/24 0922)  morphine  (PF) 4 MG/ML injection 4 mg (4 mg Intravenous Given 07/01/24 1038)                                    Medical Decision Making Amount and/or Complexity of Data Reviewed Labs: ordered. Radiology: ordered.  Risk Prescription drug management.   68 yo F with a chief complaint of right groin pain.  Going on for a couple days.  Was recently hospitalized for about a week for an upper GI bleed.  On record review the patient had a complicated visit, she was on Eliquis  for pulmonary embolism but had significant bleeding and so an IVC filter was placed and she also had an attempt at embolization.  PCP most concerned about pseudoaneurysm.  Not obviously the cause by history and exam.  Will also obtain CT imaging.  Treat pain.  Reassess.  CT with hematoma.  Ultrasound also with hematoma, no signs of pseudoaneurysm.  Hemoglobin remained stable.  No other obvious intra-abdominal pathology.  I discussed results with patient and family.  Will attempt supportive care at home.  PCP follow-up.  2:45 PM:  I have discussed the diagnosis/risks/treatment options with the patient and family.  Evaluation and diagnostic testing in the emergency department does not suggest an emergent condition requiring admission or immediate intervention beyond what has been performed at this time.  They will follow up with PCP. We also discussed returning to the ED immediately if new or worsening sx occur. We discussed the sx which are most concerning (e.g., sudden worsening pain, fever, inability to tolerate by mouth) that necessitate immediate return. Medications administered to the patient during their visit and any new  prescriptions provided to the patient are listed below.  Medications given during this visit Medications  morphine  (PF) 4 MG/ML injection 4 mg (4 mg Intravenous Given 07/01/24 0850)  ondansetron  (ZOFRAN ) injection 4 mg (4 mg Intravenous Given 07/01/24 0851)  iohexol  (OMNIPAQUE ) 300 MG/ML solution  100 mL (100 mLs Intravenous Contrast Given 07/01/24 0922)  morphine  (PF) 4 MG/ML injection 4 mg (4 mg Intravenous Given 07/01/24 1038)     The patient appears reasonably screen and/or stabilized for discharge and I doubt any other medical condition or other [facility] requiring further screening, evaluation, or treatment in the ED at this time prior to discharge.       Final diagnoses:  Right inguinal pain  Hematoma    ED Discharge Orders          Ordered    diclofenac  Sodium (VOLTAREN ) 1 % GEL  4 times daily        07/01/24 1432    morphine  (MSIR) 15 MG tablet  Every 4 hours PRN        07/01/24 1432               Emil Share, DO 07/01/24 1445

## 2024-07-01 NOTE — ED Notes (Signed)
 Ambulated pt to bathroom and back to bed. Collected a urine sample. Pt states she's still in pain. RN notified.

## 2024-07-01 NOTE — ED Triage Notes (Signed)
 Pt coming from home for possible groin hematoma per PCP visit yesterday. Pt had previous R groin arterial puncture in recent hospitalization. Pt having severe groin pain that has worsened today.

## 2024-07-02 ENCOUNTER — Other Ambulatory Visit: Payer: Self-pay | Admitting: Physician Assistant

## 2024-07-03 ENCOUNTER — Encounter: Payer: Self-pay | Admitting: Physician Assistant

## 2024-07-03 ENCOUNTER — Encounter: Payer: Self-pay | Admitting: Internal Medicine

## 2024-07-03 ENCOUNTER — Other Ambulatory Visit: Payer: Self-pay | Admitting: Internal Medicine

## 2024-07-03 ENCOUNTER — Ambulatory Visit (INDEPENDENT_AMBULATORY_CARE_PROVIDER_SITE_OTHER): Admitting: Internal Medicine

## 2024-07-03 ENCOUNTER — Telehealth: Payer: Self-pay

## 2024-07-03 VITALS — BP 136/86 | HR 66 | Temp 98.1°F | Resp 16 | Ht 63.0 in | Wt 175.6 lb

## 2024-07-03 DIAGNOSIS — D539 Nutritional anemia, unspecified: Secondary | ICD-10-CM | POA: Diagnosis not present

## 2024-07-03 DIAGNOSIS — D509 Iron deficiency anemia, unspecified: Secondary | ICD-10-CM

## 2024-07-03 DIAGNOSIS — I10 Essential (primary) hypertension: Secondary | ICD-10-CM

## 2024-07-03 DIAGNOSIS — E519 Thiamine deficiency, unspecified: Secondary | ICD-10-CM

## 2024-07-03 DIAGNOSIS — F411 Generalized anxiety disorder: Secondary | ICD-10-CM | POA: Diagnosis not present

## 2024-07-03 DIAGNOSIS — D538 Other specified nutritional anemias: Secondary | ICD-10-CM

## 2024-07-03 DIAGNOSIS — F0280 Dementia in other diseases classified elsewhere without behavioral disturbance: Secondary | ICD-10-CM

## 2024-07-03 DIAGNOSIS — I724 Aneurysm of artery of lower extremity: Secondary | ICD-10-CM | POA: Diagnosis not present

## 2024-07-03 LAB — CBC WITH DIFFERENTIAL/PLATELET
Basophils Absolute: 0 K/uL (ref 0.0–0.1)
Basophils Relative: 0.3 % (ref 0.0–3.0)
Eosinophils Absolute: 0 K/uL (ref 0.0–0.7)
Eosinophils Relative: 0 % (ref 0.0–5.0)
HCT: 30.9 % — ABNORMAL LOW (ref 36.0–46.0)
Hemoglobin: 10 g/dL — ABNORMAL LOW (ref 12.0–15.0)
Lymphocytes Relative: 11.6 % — ABNORMAL LOW (ref 12.0–46.0)
Lymphs Abs: 1.5 K/uL (ref 0.7–4.0)
MCHC: 32.4 g/dL (ref 30.0–36.0)
MCV: 83.4 fl (ref 78.0–100.0)
Monocytes Absolute: 0.5 K/uL (ref 0.1–1.0)
Monocytes Relative: 3.5 % (ref 3.0–12.0)
Neutro Abs: 11.2 K/uL — ABNORMAL HIGH (ref 1.4–7.7)
Neutrophils Relative %: 84.6 % — ABNORMAL HIGH (ref 43.0–77.0)
Platelets: 354 K/uL (ref 150.0–400.0)
RBC: 3.71 Mil/uL — ABNORMAL LOW (ref 3.87–5.11)
RDW: 16.6 % — ABNORMAL HIGH (ref 11.5–15.5)
WBC: 13.3 K/uL — ABNORMAL HIGH (ref 4.0–10.5)

## 2024-07-03 LAB — URINALYSIS, ROUTINE W REFLEX MICROSCOPIC
Bilirubin Urine: NEGATIVE
Hgb urine dipstick: NEGATIVE
Ketones, ur: NEGATIVE
Leukocytes,Ua: NEGATIVE
Nitrite: NEGATIVE
Specific Gravity, Urine: 1.015 (ref 1.000–1.030)
Total Protein, Urine: NEGATIVE
Urine Glucose: NEGATIVE
Urobilinogen, UA: 0.2 (ref 0.0–1.0)
pH: 5.5 (ref 5.0–8.0)

## 2024-07-03 MED ORDER — MORPHINE SULFATE ER 15 MG PO TBCR: 15.0000 mg | EXTENDED_RELEASE_TABLET | Freq: Two times a day (BID) | ORAL | 0 refills | Status: AC

## 2024-07-03 NOTE — Patient Instructions (Signed)

## 2024-07-03 NOTE — Progress Notes (Signed)
 Subjective:  Patient ID: Latasha Wood, female    DOB: [birth year]  Age: 68 y.o. MRN: [id number]  CC: Hospitalization Follow-up (GI Bleed. Patient states that she was in the ER for a hematoma from the procedures done in the hospital. )   HPI Latasha Wood presents for f/up ----    Discussed the use of AI scribe software for clinical note transcription with the patient, who gave verbal consent to proceed.  History of Present Illness Latasha Wood is a 68 year old female with a history of GI bleeds on Eliquis  who presents with severe groin pain. Her sister accompanied her to the visit.  She has a history of gastrointestinal bleeding following a colonoscopy, which led to a significant hemorrhagic event requiring ICU admission for two days. During this time, she received two units of blood and fluids due to severe blood loss and hypotension. She underwent an interventional radiology procedure after her hospitalization, but she recalls that no active bleeding was identified during the procedure. She was subsequently discharged with an IVC filter placed due to recurrent GI bleeds while on Eliquis .  She continues to experience pain in her groin, described as 'terrible' and radiating to her hip. This pain began after her recent hospital discharge and has been severe enough to require morphine  for management. An ultrasound revealed a large hematoma in the groin area. The pain is exacerbated by movement and has been severe enough to cause tears. She has been using lidocaine  patches and Tylenol  for additional pain relief.  She has a history of blood clots, and recent imaging showed two new small blood clots in the lungs. She is currently not on Eliquis  and has an IVC filter in place.  She mentions a history of multiple surgeries on her colon in 2016 and 2017, which have resulted in significant scar tissue. She also has a friable stoma that bleeds easily upon contact, but this is not  contributing to her current hemoglobin drop.  She is currently taking morphine  for pain management, which provides temporary relief, and takes Tylenol  500 mg every eight hours. She is concerned about the adequacy of her pain management and the duration of her symptoms.  Admit date: 06/23/2024 Discharge date: 06/29/2024   Admitted From: Home Disposition:  Home   Discharge Condition:Stable CODE STATUS:FULL Diet recommendation: Heart Healthy    Brief/Interim Summary: Patient is a 68 year old female with history of ulcerative pancolitis status post colectomy, postoperative fistulas, small bowel obstruction, high ostomy output, PE/DVT on Eliquis  who presented from home with complaint of bloody output from ileostomy.  She was admitted on June 24 for the same problem.  Report of multiple episodes of bloody output in the ileostomy bag, dizziness.  On presentation hemoglobin was 10.2.  CT abdomen/pelvis showed accumulation of high density material and dilated segment of small bowel near the surgical anastomotic site.  Suspected GI bleed.  GI consulted.  After admission, she became unresponsive and hypotensive.  Patient transferred to ICU service, given blood transfusion.  Currently hemoglobin stable, she is hemodynamically stable.  Transferred back to [facility] service on 8/18.  GI/oncology were consulted .  CT chest done on this hospitalization also showed new left-sided PE.  Currently she is asymptomatic.  Decision made to put an IVC filter.  Eliquis  will be held on discharge.  Plan for resumption  of Eliquis  in the future after discussing with Dr. Federico as an outpatient   Following problems were addressed during the hospitalization:  Hemorrhagic shock /syncope secondary recurrent upper GI bleed in the setting of anticoagulation: Presented with complaint of bloody output from ileostomy.  Report of multiple episodes of bloody output in the ileostomy bag, dizziness.  Became hypotensive unresponsive during  this hospitalization and had to be transferred to ICU.  Given 2 units of PRBC.  Given Kcentra , tranexamic acid . CT angiogram on 8/15 showed accumulation of high density material seen in dilated segment of small bowel in pelvis near surgical anastomosis, concerning for possible focus of gastrointestinal bleeding. IR was consulted for arterial embolization on 8/15 and underwent mesenteric aortography but no source was located.  GI was following.  Ileoscopy did not show any bleeding.    Currently on PPI, continue the same.  We also consulted general surgery for any surgical approach for the management of GI bleed as seen on the anastomotic area.  General surgery declined for any intervention. Hemoglobin in the range of 9 today.  No plan for further workup.  Check CBC in a week.   Acute blood loss secondary to upper GI bleed: Given 2 units of transfusion during this hospitalization.  Currently hemoglobin stable in the range 9.  Given IV iron  during this hospitalization   CKD stage IIIa: Currently kidney function at baseline.   History of DVT/PE: Was taking Eliquis  at home.  She was previously found to have DVT, acute bilateral PE without evidence of heart strain.  Currently Eliquis  on hold.  She follows with Dr Federico who was following her here. CT angiogram done here showed new PE on the left, no significant right heart strain, follow-up echo.  Currently anticoagulation on hold.  IR put IVC filter on 8/20. she will follow-up with Dr. Federico and will be started anticoagulation at some point as an outpatient   Hypokalemia: Continue potassium supplementation at home   History of ulcerative colitis/ileostomy: Follows with GI   History of thyroid  mass: Should be follow-up as an outpatient   Discharge Diagnoses:  Principal Problem:   GI bleed Active Problems:   Hemorrhagic shock (HCC)   Stage 3a chronic kidney disease (HCC)   History of total colectomy   Chronic anticoagulation   History of pulmonary  embolism   Abnormal computed tomography angiography (CTA) of abdomen   Acute blood loss anemia   Foreign body in small intestine   Deep vein thrombosis (DVT) of proximal lower extremity (HCC)   Small bowel bleed not requiring more than 4 units of blood in 24 hours, ICU, or surgery       Discharge Instructions   Discharge Instructions       Diet - low sodium heart healthy   Complete by: As directed      Discharge instructions   Complete by: As directed      1)Please take your medications as instructed 2)Follow up with your PCP in a week and do a CBC to check your hemoglobin 3)Follow up with your hematologist, Dr. Federico.  Dr. Federico will determine when to restart Eliquis     Increase activity slowly   Complete by: As directed      No wound care   Complete by: As directed             Outpatient Medications Prior to Visit  Medication Sig Dispense Refill   apixaban  (ELIQUIS ) 5 MG TABS tablet Take 1 tablet (5 mg total) by mouth 2 (two) times daily. 360 tablet 0   cholecalciferol (VITAMIN D3) 25 MCG (1000 UNIT) tablet Take 1,000 Units by  mouth daily.     hydrocortisone  2.5 % cream APPLY TOPICALLY TWICE DAILY (Patient taking differently: Apply 1 Application topically 2 (two) times daily as needed (Dermatitis).) 120 g 0   indapamide  (LOZOL ) 1.25 MG tablet TAKE 1 TABLET BY MOUTH DAILY 100 tablet 2   losartan  (COZAAR ) 50 MG tablet Take 1 tablet (50 mg total) by mouth daily. 90 tablet 1   Multiple Vitamin (MULTIVITAMIN WITH MINERALS) TABS tablet Take 1 tablet by mouth daily with breakfast.     nystatin  cream (MYCOSTATIN ) Apply 1 Application topically 2 (two) times daily. (Patient taking differently: Apply 1 Application topically as needed for dry skin.) 60 g 1   pantoprazole  (PROTONIX ) 40 MG tablet Take 1 tablet (40 mg total) by mouth daily. 90 tablet 3   potassium chloride  (KLOR-CON ) 10 MEQ tablet TAKE 1 TABLET BY MOUTH 3 TIMES  DAILY 270 tablet 0   predniSONE  (DELTASONE ) 10 MG tablet 3  tabs by mouth per day for 3 days,2tabs per day for 3 days,1tab per day for 3 days 18 tablet 0   ALPRAZolam  (XANAX ) 0.25 MG tablet Take 1 tablet (0.25 mg total) by mouth 2 (two) times daily as needed for anxiety. 180 tablet 0   diclofenac  Sodium (VOLTAREN ) 1 % GEL Apply 4 g topically 4 (four) times daily. 100 g 0   morphine  (MSIR) 15 MG tablet Take 0.5 tablets (7.5 mg total) by mouth every 4 (four) hours as needed for severe pain (pain score 7-10). 5 tablet 0   augmented betamethasone dipropionate (DIPROLENE-AF) 0.05 % cream Apply 1 Application topically 2 (two) times daily as needed (Dermatitis).     No facility-administered medications prior to visit.    ROS Review of Systems  Constitutional:  Negative for appetite change, chills, diaphoresis, fatigue and fever.  HENT: Negative.    Eyes: Negative.   Respiratory: Negative.  Negative for cough, chest tightness, shortness of breath and wheezing.   Cardiovascular:  Negative for chest pain, palpitations and leg swelling.  Gastrointestinal:  Negative for abdominal pain, constipation, diarrhea, nausea and vomiting.  Genitourinary: Negative.  Negative for difficulty urinating and dysuria.  Musculoskeletal:  Positive for arthralgias.  Skin: Negative.  Negative for color change and pallor.  Neurological: Negative.  Negative for dizziness, weakness and headaches.  Hematological:  Negative for adenopathy. Does not bruise/bleed easily.  Psychiatric/Behavioral:  Positive for confusion and decreased concentration. Negative for behavioral problems and sleep disturbance. The patient is nervous/anxious.     Objective:  BP 136/86 (BP Location: Left Arm, Patient Position: Sitting, Cuff Size: Normal)   Pulse 66   Temp 98.1 F (36.7 C) (Oral)   Resp 16   Ht 5' 3 (1.6 m)   Wt 175 lb 9.6 oz (79.7 kg)   SpO2 97%   BMI 31.11 kg/m   BP Readings from Last 3 Encounters:  07/05/24 (!) 175/93  07/03/24 136/86  07/01/24 (!) 141/75    Wt Readings from  Last 3 Encounters:  07/05/24 176 lb 6.4 oz (80 kg)  07/03/24 175 lb 9.6 oz (79.7 kg)  07/01/24 179 lb 14.3 oz (81.6 kg)    Physical Exam Vitals reviewed.  Constitutional:      Appearance: Normal appearance.  HENT:     Nose: Nose normal.     Mouth/Throat:     Mouth: Mucous membranes are moist.  Eyes:     General: No scleral icterus.    Conjunctiva/sclera: Conjunctivae normal.  Cardiovascular:     Rate and Rhythm: Normal rate and regular  rhythm.     Pulses: Normal pulses.     Heart sounds: No murmur heard.    No friction rub. No gallop.  Pulmonary:     Effort: Pulmonary effort is normal.     Breath sounds: No stridor. No wheezing, rhonchi or rales.  Abdominal:     General: Abdomen is flat.     Palpations: There is no mass.     Tenderness: There is no abdominal tenderness. There is no guarding.     Hernia: No hernia is present.  Musculoskeletal:        General: Normal range of motion.     Cervical back: Neck supple.     Right lower leg: No edema.     Left lower leg: No edema.  Lymphadenopathy:     Cervical: No cervical adenopathy.  Skin:    General: Skin is warm and dry.  Neurological:     General: No focal deficit present.     Mental Status: She is alert. Mental status is at baseline.  Psychiatric:        Mood and Affect: Mood normal.        Behavior: Behavior normal.     Lab Results  Component Value Date   WBC 13.3 (H) 07/03/2024   HGB 10.0 (L) 07/03/2024   HCT 30.9 (L) 07/03/2024   PLT 354.0 07/03/2024   GLUCOSE 112 (H) 07/01/2024   CHOL 171 03/06/2024   TRIG 68.0 03/06/2024   HDL 110.00 03/06/2024   LDLDIRECT 114.0 04/29/2017   LDLCALC 48 03/06/2024   ALT 10 06/25/2024   AST 15 06/25/2024   NA 138 07/01/2024   K 4.2 07/01/2024   CL 107 07/01/2024   CREATININE 1.10 (H) 07/01/2024   BUN 10 07/01/2024   CO2 22 07/01/2024   TSH 1.00 03/06/2024   INR 1.0 06/28/2024   HGBA1C 5.6 04/21/2024   MICROALBUR 0.7 03/06/2024    VAS US  GROIN  PSEUDOANEURYSM Result Date: 07/01/2024  ARTERIAL PSEUDOANEURYSM  Patient Name:  Latasha Wood  Date of Exam:   07/01/2024 Medical Rec #: [id number]            Accession #:    [id number] Date of Birth: 23-Oct-1956             Patient Gender: F Patient Age:   80 years Exam Location:  [facility] Procedure:      VAS US  GROIN PSEUDOANEURYSM Referring Phys: DAN FLOYD --------------------------------------------------------------------------------  Exam: Right groin Indications: Patient complains of groin pain and bruising. History: Recent right arterial puncture during recent hospitalization for GI bleed and possible IR intervention. Comparison Study: No prior study Performing Technologist: Alberta Lis RVS  Examination Guidelines: A complete evaluation includes B-mode imaging, spectral Doppler, color Doppler, and power Doppler as needed of all accessible portions of each vessel. Bilateral testing is considered an integral part of a complete examination. Limited examinations for reoccurring indications may be performed as noted.  Findings: A mixed echogenic structure measuring approximately 3.2 cm x 2.7 cm is visualized at the right anterior lateral thigh with ultrasound characteristics of a hematoma.  Summary: No evidence of pseudoaneurysm, AVF or DVT  Diagnosing physician: Gaile New MD Electronically signed by Gaile New MD on 07/01/2024 at 9:52:12 PM.   --------------------------------------------------------------------------------    Final    CT ABDOMEN PELVIS W CONTRAST Result Date: 07/01/2024 EXAM: CT ABDOMEN AND PELVIS WITH CONTRAST 07/01/2024 09:30:56 AM TECHNIQUE: CT of the abdomen and pelvis was performed with the administration of intravenous contrast.  Multiplanar reformatted images are provided for review. Automated exposure control, iterative reconstruction, and/or weight-based adjustment of the mA/kV was utilized to reduce the radiation dose to as low as reasonably achievable.  COMPARISON: 06/23/2024 CLINICAL HISTORY: Abdominal pain, acute, nonlocalized. Per triage notes: Pt coming from home for possible groin hematoma per PCP visit yesterday. Pt had previous R groin arterial puncture in recent hospitalization. Pt having severe groin pain that has worsened today. FINDINGS: LOWER CHEST: Visualized lung bases are clear. LIVER: Normal size and contour. GALLBLADDER AND BILE DUCTS: No wall thickening. No cholelithiasis. No biliary ductal dilatation. SPLEEN: No splenic staple lines in the pelvis. PANCREAS: Stable mild pancreatic ductal dilatation without evident lesion or regional inflammatory change. ADRENAL GLANDS: Normal appearance. No mass. KIDNEYS, URETERS AND BLADDER: 2.2 cm 15 HU renal cortical cyst right mid kidney. Per consensus, no follow-up is needed for simple Bosniak type 1 and 2 renal cysts, unless the patient has a malignancy history or risk factors. No stones in the kidneys or ureters. No hydronephrosis. No perinephric or periureteral stranding. Urinary bladder is unremarkable. GI AND BOWEL: Stomach demonstrates no acute abnormality. There is no bowel obstruction. No bowel wall thickening. Right lower quadrant ostomy. Infraumbilical rectus diastasis with a broad hernia containing a loop of small bowel, without obstruction or strangulation. PERITONEUM AND RETROPERITONEUM: No ascites. No free air. VASCULATURE: Retrievable infrarenal IVC filter in good location. Aorta is normal in caliber. LYMPH NODES: No lymphadenopathy. REPRODUCTIVE ORGANS: No significant abnormality. BONES AND SOFT TISSUES: Vertebral endplate spurring at multiple levels in the lower thoracic spine. Facet DJD L4 - S1. Right hip DJD. Mild inflammatory/ infiltrative changes overlying the right common femoral vessels presumably from venopuncture 06/28/24. No discrete hematoma or active extravasation. IMPRESSION: 1. Mild inflammatory changes overlying the right common femoral vessels, presumably from venous puncture  06/28/24. No discrete hematoma or active extravasation. 2. Infraumbilical rectus diastasis with a broad hernia containing a loop of small bowel, without obstruction or strangulation. Electronically signed by: Katheleen Faes MD 07/01/2024 10:36 AM EDT RP Workstation: [id number]    Assessment & Plan:  Iron  deficiency anemia, unspecified iron  deficiency anemia type -     CBC with Differential/Platelet; Future  Essential hypertension- BP is well controlled. -     Urinalysis, Routine w reflex microscopic; Future  Deficiency anemia -     Vitamin B1; Future -     Zinc ; Future  Pseudoaneurysm of right femoral artery (HCC) -     Morphine  Sulfate ER; Take 1 tablet (15 mg total) by mouth every 12 (twelve) hours.  Dispense: 60 tablet; Refill: 0  GAD (generalized anxiety disorder) -     ALPRAZolam ; Take 1 tablet (0.25 mg total) by mouth 2 (two) times daily as needed for anxiety.  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 -     Thiamine  HCl; Take 1 tablet (100 mg total) by mouth daily.  Dispense: 90 tablet; Refill: 0  Anemia due to acquired thiamine  deficiency -     Thiamine  HCl; Take 1 tablet (100 mg total) by mouth daily.  Dispense: 90 tablet; Refill: 0     Follow-up: Return in about 6 weeks (around 08/14/2024).  Debby Molt, MD

## 2024-07-03 NOTE — Telephone Encounter (Signed)
 Copied from CRM #[id number]. Topic: Clinical - Medical Advice >> Jul 03, 2024 11:58 AM Laymon HERO wrote: Reason for CRM: Patient has a few questions and wants to speak to Dr Joshua or his nurse to go over her visit from today.

## 2024-07-05 ENCOUNTER — Ambulatory Visit

## 2024-07-05 VITALS — BP 175/93 | HR 64 | Temp 97.5°F | Resp 14 | Ht 63.5 in | Wt 176.4 lb

## 2024-07-05 DIAGNOSIS — D509 Iron deficiency anemia, unspecified: Secondary | ICD-10-CM

## 2024-07-05 DIAGNOSIS — D508 Other iron deficiency anemias: Secondary | ICD-10-CM

## 2024-07-05 MED ORDER — SODIUM CHLORIDE 0.9 % IV SOLN
510.0000 mg | Freq: Once | INTRAVENOUS | Status: AC
  Administered 2024-07-05: 510 mg via INTRAVENOUS
  Filled 2024-07-05: qty 17

## 2024-07-05 NOTE — Progress Notes (Signed)
 Diagnosis: Iron  Deficiency Anemia  Provider:  Praveen Mannam MD  Procedure: IV Infusion  IV Type: Peripheral, IV Location: R Forearm  Cathflo (Altepase), Feraheme  (Ferumoxytol ), Dose: 510 mg  Infusion Start Time: 1213  Infusion Stop Time: 1229  Post Infusion IV Care: Observation period completed and Peripheral IV Discontinued  Discharge: Condition: Good, Destination: Home . AVS Declined  Performed by:  Onelia Cadmus, RN

## 2024-07-05 NOTE — Telephone Encounter (Signed)
 I discussed with the patient her questions and she gave me a verbal understanding.

## 2024-07-06 MED ORDER — ALPRAZOLAM 0.25 MG PO TABS: 0.2500 mg | ORAL_TABLET | Freq: Two times a day (BID) | ORAL | 0 refills | Status: AC | PRN

## 2024-07-08 LAB — VITAMIN B1: Vitamin B1 (Thiamine): <6 nmol/L — ABNORMAL LOW (ref 8–30)

## 2024-07-08 LAB — ZINC: Zinc: 67 ug/dL (ref 60–130)

## 2024-07-09 ENCOUNTER — Ambulatory Visit: Payer: Self-pay | Admitting: Internal Medicine

## 2024-07-09 DIAGNOSIS — E519 Thiamine deficiency, unspecified: Secondary | ICD-10-CM | POA: Insufficient documentation

## 2024-07-09 MED ORDER — THIAMINE HCL 100 MG PO TABS: 100.0000 mg | ORAL_TABLET | Freq: Every day | ORAL | 0 refills | Status: AC

## 2024-07-11 ENCOUNTER — Inpatient Hospital Stay: Attending: Hematology and Oncology

## 2024-07-11 ENCOUNTER — Other Ambulatory Visit: Payer: Self-pay | Admitting: Hematology and Oncology

## 2024-07-11 ENCOUNTER — Inpatient Hospital Stay (HOSPITAL_BASED_OUTPATIENT_CLINIC_OR_DEPARTMENT_OTHER): Admitting: Hematology and Oncology

## 2024-07-11 VITALS — BP 135/91 | HR 74 | Temp 97.6°F | Resp 18 | Wt 176.0 lb

## 2024-07-11 DIAGNOSIS — D649 Anemia, unspecified: Secondary | ICD-10-CM | POA: Diagnosis not present

## 2024-07-11 DIAGNOSIS — E079 Disorder of thyroid, unspecified: Secondary | ICD-10-CM | POA: Insufficient documentation

## 2024-07-11 DIAGNOSIS — Z86711 Personal history of pulmonary embolism: Secondary | ICD-10-CM | POA: Insufficient documentation

## 2024-07-11 DIAGNOSIS — F1090 Alcohol use, unspecified, uncomplicated: Secondary | ICD-10-CM | POA: Diagnosis not present

## 2024-07-11 DIAGNOSIS — D508 Other iron deficiency anemias: Secondary | ICD-10-CM | POA: Diagnosis not present

## 2024-07-11 DIAGNOSIS — Z86718 Personal history of other venous thrombosis and embolism: Secondary | ICD-10-CM | POA: Diagnosis not present

## 2024-07-11 DIAGNOSIS — Z803 Family history of malignant neoplasm of breast: Secondary | ICD-10-CM | POA: Insufficient documentation

## 2024-07-11 DIAGNOSIS — Z800 Family history of malignant neoplasm of digestive organs: Secondary | ICD-10-CM | POA: Diagnosis not present

## 2024-07-11 DIAGNOSIS — I2699 Other pulmonary embolism without acute cor pulmonale: Secondary | ICD-10-CM

## 2024-07-11 DIAGNOSIS — Z808 Family history of malignant neoplasm of other organs or systems: Secondary | ICD-10-CM | POA: Insufficient documentation

## 2024-07-11 LAB — CBC WITH DIFFERENTIAL (CANCER CENTER ONLY)
Abs Immature Granulocytes: 0.08 K/uL — ABNORMAL HIGH (ref 0.00–0.07)
Basophils Absolute: 0 K/uL (ref 0.0–0.1)
Basophils Relative: 0 %
Eosinophils Absolute: 0.4 K/uL (ref 0.0–0.5)
Eosinophils Relative: 4 %
HCT: 33.1 % — ABNORMAL LOW (ref 36.0–46.0)
Hemoglobin: 10.7 g/dL — ABNORMAL LOW (ref 12.0–15.0)
Immature Granulocytes: 1 %
Lymphocytes Relative: 22 %
Lymphs Abs: 2.1 K/uL (ref 0.7–4.0)
MCH: 27.9 pg (ref 26.0–34.0)
MCHC: 32.3 g/dL (ref 30.0–36.0)
MCV: 86.2 fL (ref 80.0–100.0)
Monocytes Absolute: 0.7 K/uL (ref 0.1–1.0)
Monocytes Relative: 7 %
Neutro Abs: 6.4 K/uL (ref 1.7–7.7)
Neutrophils Relative %: 66 %
Platelet Count: 303 K/uL (ref 150–400)
RBC: 3.84 MIL/uL — ABNORMAL LOW (ref 3.87–5.11)
RDW: 16.4 % — ABNORMAL HIGH (ref 11.5–15.5)
WBC Count: 9.7 K/uL (ref 4.0–10.5)
nRBC: 0 % (ref 0.0–0.2)

## 2024-07-11 LAB — RETIC PANEL
Immature Retic Fract: 10.5 % (ref 2.3–15.9)
RBC.: 3.84 MIL/uL — ABNORMAL LOW (ref 3.87–5.11)
Retic Count, Absolute: 140.9 K/uL (ref 19.0–186.0)
Retic Ct Pct: 3.7 % — ABNORMAL HIGH (ref 0.4–3.1)
Reticulocyte Hemoglobin: 37.4 pg (ref >27.9)

## 2024-07-11 LAB — CMP (CANCER CENTER ONLY)
ALT: 14 U/L (ref 0–44)
AST: 14 U/L — ABNORMAL LOW (ref 15–41)
Albumin: 3.5 g/dL (ref 3.5–5.0)
Alkaline Phosphatase: 77 U/L (ref 38–126)
Anion gap: 6 (ref 5–15)
BUN: 11 mg/dL (ref 8–23)
CO2: 33 mmol/L — ABNORMAL HIGH (ref 22–32)
Calcium: 9.1 mg/dL (ref 8.9–10.3)
Chloride: 101 mmol/L (ref 98–111)
Creatinine: 1.08 mg/dL — ABNORMAL HIGH (ref 0.44–1.00)
GFR, Estimated: 56 mL/min — ABNORMAL LOW (ref >60)
Glucose, Bld: 82 mg/dL (ref 70–99)
Potassium: 3.9 mmol/L (ref 3.5–5.1)
Sodium: 140 mmol/L (ref 135–145)
Total Bilirubin: 0.4 mg/dL (ref 0.0–1.2)
Total Protein: 6.6 g/dL (ref 6.5–8.1)

## 2024-07-11 LAB — FERRITIN: Ferritin: 814 ng/mL — ABNORMAL HIGH (ref 11–307)

## 2024-07-11 LAB — IRON AND IRON BINDING CAPACITY (CC-WL,HP ONLY)
Iron: 118 ug/dL (ref 28–170)
Saturation Ratios: 30 % (ref 10.4–31.8)
TIBC: 391 ug/dL (ref 250–450)
UIBC: 273 ug/dL (ref 148–442)

## 2024-07-11 NOTE — Progress Notes (Signed)
 [facility] [phone]) [phone]   [phone]  PROGRESS NOTE  Patient Care Team: Joshua Debby CROME, MD as PCP - General (Internal Medicine) Obie Princella HERO, MD (Inactive) (Gastroenterology) Gorge Ade, MD (Obstetrics and Gynecology) Rik Channel, MD (Ophthalmology) Debby Hila, MD as Consulting Physician (Colon and Rectal Surgery) Merceda Lela SAUNDERS, [facility] (Pharmacist) Enola Feliciano Hugger, MD as Consulting Physician (Ophthalmology)  Hematological/Oncological History # Bilateral Lower Extremity DVT # Bilateral Pulmonary Emboli 04/20/2024: Presented to cardiologist with progressive shortness of breath and dyspnea over the last few months. Labs showed positive D-dimer and was referred to ED for further evaluation.  04/20/2024-04/23/2024:  04/20/2024: CTA chest: Acute bilateral pulmonary emboli without evidence of right heart strain.  04/21/2024: LE Ultrasound: Acute DVT involving the right peroneal veins and left proximal femoral vein and left profunda vein.  Patient was started on heparin  drip and discharged on Eliquis  therapy Hypercoagulable workup was negative.  05/02/2024: Establish care with [facility] Hematology   Interval History:  Latasha Wood 68 y.o. female with medical history significant for DVT/PET who presents for a follow up visit. The patient's last visit was on 05/02/2024. In the interim since the last visit she was hospitalized for severe anemia following our last visit.  Additionally an IVC filter was placed and Eliquis  is on hold.  On exam today Mrs. Pridgen reports she has been well overall in the interim since her last visit.  She reports she did develop a hematoma on her side where the IR venogram was performed.  She reports that she is also recently started taking vitamin B1 because she was severely deficient during her hospitalization.  She did receive 2 doses of IV Venofer  200 mg as well as a 500 mg dose of Feraheme  prior to  discharge.  She reports that she has had no overt signs of bleeding, bruising, or dark stools other than the bruise from her IR procedure.  She reports that everything else has been good.  She notes that she does still have some occasional pain in her hip but overall everything is steady.  She is not having any shortness of breath, lightheadedness, or dizziness.  A full 10 point ROS is otherwise negative.  MEDICAL HISTORY:  Past Medical History:  Diagnosis Date   Anemia    Anxiety    Arthritis    Blood transfusion    Bowel obstruction (HCC)    Colitis, ulcerative (HCC)    Colon polyps    DVT (deep venous thrombosis) (HCC)    Hyperlipidemia    Hypertension    Ileostomy present (HCC)    Kidney disease    Numbness and tingling    hands and feet bilat    Pulmonary embolism (HCC)    Shortness of breath dyspnea    talking or walking    Type 2 diabetes mellitus with complication, without long-term current use of insulin  (HCC) 09/05/2015   currently on no medications, (05/26/2016) pt denies diabetes.  States that she had one time high blood sugars d/t prednisone     SURGICAL HISTORY: Past Surgical History:  Procedure Laterality Date   ABDOMINAL HYSTERECTOMY  2000   ABDOMINAL SURGERY     BREAST CYST ASPIRATION     CATARACT EXTRACTION W/PHACO Right 10/20/2017   Procedure: CATARACT EXTRACTION PHACO AND INTRAOCULAR LENS PLACEMENT (IOC);  Surgeon: Myrna Adine Anes, MD;  Location: [facility];  Service: Ophthalmology;  Laterality: Right;  US  00:50.1 AP% 13.2 CDE 6.61 Fluid Pack lot # [id number]   COLON SURGERY  colostomy   cortisol shot     in left shoulder   DILATION AND CURETTAGE OF UTERUS     most likely after miscarriage   ESOPHAGOGASTRODUODENOSCOPY N/A 05/03/2024   Procedure: EGD (ESOPHAGOGASTRODUODENOSCOPY);  Surgeon: Charlanne Groom, MD;  Location: [facility];  Service: Gastroenterology;  Laterality: N/A;   fibrocystic breast disease     q 6 month mammogram   gravida 6 para 2      all SAB   hyadiform mole     ILEO LOOP DIVERSION N/A 09/25/2015   Procedure: ILEO LOOP COLOSTOMY;  Surgeon: Bernarda Ned, MD;  Location: [facility];  Service: General;  Laterality: N/A;   ILEOSCOPY N/A 06/25/2024   Procedure: ILEOSCOPY, THROUGH STOMA;  Surgeon: Stacia Glendia BRAVO, MD;  Location: [facility];  Service: Gastroenterology;  Laterality: N/A;   ILEOSTOMY CLOSURE N/A 10/08/2015   Procedure: ILEOSTOMY REVISION;  Surgeon: Bernarda Ned, MD;  Location: [facility];  Service: General;  Laterality: N/A;   ILEOSTOMY CLOSURE N/A 08/20/2016   Procedure: OPEN RELOCATION OF ILEOSTOMY;  Surgeon: Bernarda Ned, MD;  Location: [facility];  Service: General;  Laterality: N/A;   IR ANGIOGRAM SELECTIVE EACH ADDITIONAL VESSEL  06/23/2024   IR ANGIOGRAM SELECTIVE EACH ADDITIONAL VESSEL  06/23/2024   IR ANGIOGRAM VISCERAL SELECTIVE  06/23/2024   IR GENERIC HISTORICAL  09/01/2016   IR US  GUIDE VASC ACCESS RIGHT 09/01/2016 Franky Rusk, PA-C [facility]   IR GENERIC HISTORICAL  09/01/2016   IR FLUORO GUIDE CV LINE RIGHT 09/01/2016 Franky Rusk, PA-C [facility]   IR IVC FILTER PLMT / S&I PORTER GUID/MOD SED  06/28/2024   IR TUNNELED CENTRAL VENOUS CATH PLC W IMG  06/28/2024   IR US  GUIDE VASC ACCESS RIGHT  06/23/2024   KNEE ARTHROSCOPY W/ MENISCAL REPAIR     right knee (Daldorf)   LAPAROSCOPIC SMALL BOWEL RESECTION N/A 09/25/2015   Procedure: LAPAROSCOPIC BOWEL RESECTION TIMES TWO;  Surgeon: Bernarda Ned, MD;  Location: [facility];  Service: General;  Laterality: N/A;   ROBOTIC ASSISTED LAPAROSCOPIC LYSIS OF ADHESION N/A 09/25/2015   Procedure: XI ROBOTIC ASSISTED LAPAROSCOPIC LYSIS OF ADHESION;  Surgeon: Bernarda Ned, MD;  Location: [facility];  Service: General;  Laterality: N/A;  90 minutes   Small bowel obstruction     SMART PILL PROCEDURE  06/25/2024   Procedure: CAPSULE ENDOSCOPY, PATENCY;  Surgeon: Stacia Glendia BRAVO, MD;  Location: [facility];  Service: Gastroenterology;;    SOCIAL HISTORY: Social  History   Socioeconomic History   Marital status: Married    Spouse name: Not on file   Number of children: 2   Years of education: 18   Highest education level: Bachelor's degree (e.g., BA, AB, BS)  Occupational History   Occupation: Dispensing optician: [employer]    Comment: [employer]  Tobacco Use   Smoking status: Never    Passive exposure: Never   Smokeless tobacco: Never  Vaping Use   Vaping status: Never Used  Substance and Sexual Activity   Alcohol use: Yes    Alcohol/week: 2.0 standard drinks of alcohol    Types: 2 Standard drinks or equivalent per week    Comment: occ   Drug use: No   Sexual activity: Yes    Partners: Male  Other Topics Concern   Not on file  Social History Narrative   HSG, [employer] - BA, [employer] for med tech.  Married '75. 2 dtrs - '79, '82; 1 grandchild. Marriage is in good health. No abuse  issues.    Social Drivers of Corporate investment banker Strain: Low Risk  (04/21/2024)   Overall Financial Resource Strain (CARDIA)    Difficulty of Paying Living Expenses: Not very hard  Food Insecurity: No Food Insecurity (06/24/2024)   Hunger Vital Sign    Worried About Running Out of Food in the Last Year: Never true    Ran Out of Food in the Last Year: Never true  Transportation Needs: No Transportation Needs (06/24/2024)   PRAPARE - Administrator, Civil Service (Medical): No    Lack of Transportation (Non-Medical): No  Physical Activity: Insufficiently Active (04/21/2024)   Exercise Vital Sign    Days of Exercise per Week: 2 days    Minutes of Exercise per Session: 30 min  Stress: No Stress Concern Present (04/21/2024)   [employer] of Occupational Health - Occupational Stress Questionnaire    Feeling of Stress: Not at all  Social Connections: Socially Integrated (06/24/2024)   Social Connection and Isolation Panel    Frequency of Communication with Friends and Family: More than three times a week    Frequency of  Social Gatherings with Friends and Family: Once a week    Attends Religious Services: More than 4 times per year    Active Member of [employer] or Organizations: Yes    Attends Engineer, structural: More than 4 times per year    Marital Status: Married  Recent Concern: Social Connections - Moderately Isolated (04/14/2024)   Social Connection and Isolation Panel    Frequency of Communication with Friends and Family: More than three times a week    Frequency of Social Gatherings with Friends and Family: More than three times a week    Attends Religious Services: Never    Database administrator or Organizations: No    Attends Banker Meetings: Never    Marital Status: Married  Catering manager Violence: Not At Risk (06/24/2024)   Humiliation, Afraid, Rape, and Kick questionnaire    Fear of Current or Ex-Partner: No    Emotionally Abused: No    Physically Abused: No    Sexually Abused: No    FAMILY HISTORY: Family History  Problem Relation Age of Onset   Diabetes Mother    Breast cancer Mother 11   Diabetes Father    Heart disease Father    HIV Father    Diabetes Sister    Breast cancer Sister 18   Diverticulosis Sister    Throat cancer Sister    Diabetes Sister    Irritable bowel syndrome Sister    Diabetes Brother    Diverticulosis Brother    Diabetes Maternal Grandmother    Hyperlipidemia Daughter    Breast cancer Maternal Aunt    Colon cancer Paternal Aunt     ALLERGIES:  is allergic to betadine  [povidone iodine ], latex, and tape.  MEDICATIONS:  Current Outpatient Medications  Medication Sig Dispense Refill   ALPRAZolam  (XANAX ) 0.25 MG tablet Take 1 tablet (0.25 mg total) by mouth 2 (two) times daily as needed for anxiety. 180 tablet 0   [Paused] apixaban  (ELIQUIS ) 5 MG TABS tablet Take 1 tablet (5 mg total) by mouth 2 (two) times daily. 360 tablet 0   augmented betamethasone dipropionate (DIPROLENE-AF) 0.05 % cream Apply 1 Application topically 2 (two)  times daily as needed (Dermatitis).     cholecalciferol (VITAMIN D3) 25 MCG (1000 UNIT) tablet Take 1,000 Units by mouth daily.     hydrocortisone  2.5 %  cream APPLY TOPICALLY TWICE DAILY (Patient taking differently: Apply 1 Application topically 2 (two) times daily as needed (Dermatitis).) 120 g 0   indapamide  (LOZOL ) 1.25 MG tablet TAKE 1 TABLET BY MOUTH DAILY 100 tablet 2   losartan  (COZAAR ) 50 MG tablet Take 1 tablet (50 mg total) by mouth daily. 90 tablet 1   morphine  (MS CONTIN ) 15 MG 12 hr tablet Take 1 tablet (15 mg total) by mouth every 12 (twelve) hours. 60 tablet 0   Multiple Vitamin (MULTIVITAMIN WITH MINERALS) TABS tablet Take 1 tablet by mouth daily with breakfast.     nystatin  cream (MYCOSTATIN ) Apply 1 Application topically 2 (two) times daily. (Patient taking differently: Apply 1 Application topically as needed for dry skin.) 60 g 1   pantoprazole  (PROTONIX ) 40 MG tablet Take 1 tablet (40 mg total) by mouth daily. 90 tablet 3   potassium chloride  (KLOR-CON ) 10 MEQ tablet TAKE 1 TABLET BY MOUTH 3 TIMES  DAILY 270 tablet 0   predniSONE  (DELTASONE ) 10 MG tablet 3 tabs by mouth per day for 3 days,2tabs per day for 3 days,1tab per day for 3 days 18 tablet 0   thiamine  (VITAMIN B1) 100 MG tablet Take 1 tablet (100 mg total) by mouth daily. 90 tablet 0   No current facility-administered medications for this visit.    REVIEW OF SYSTEMS:   Constitutional: ( - ) fevers, ( - )  chills , ( - ) night sweats Eyes: ( - ) blurriness of vision, ( - ) double vision, ( - ) watery eyes Ears, nose, mouth, throat, and face: ( - ) mucositis, ( - ) sore throat Respiratory: ( - ) cough, ( - ) dyspnea, ( - ) wheezes Cardiovascular: ( - ) palpitation, ( - ) chest discomfort, ( - ) lower extremity swelling Gastrointestinal:  ( - ) nausea, ( - ) heartburn, ( - ) change in bowel habits Skin: ( - ) abnormal skin rashes Lymphatics: ( - ) new lymphadenopathy, ( - ) easy bruising Neurological: ( - ) numbness,  ( - ) tingling, ( - ) new weaknesses Behavioral/Psych: ( - ) mood change, ( - ) new changes  All other systems were reviewed with the patient and are negative.  PHYSICAL EXAMINATION: Vitals:   07/11/24 0928  BP: (!) 135/91  Pulse: 74  Resp: 18  Temp: 97.6 F (36.4 C)  SpO2: 100%    Filed Weights   07/11/24 0928  Weight: 176 lb (79.8 kg)     GENERAL: Well-appearing elderly African-American female, alert, no distress and comfortable SKIN: skin color, texture, turgor are normal, no rashes or significant lesions EYES: conjunctiva are pink and non-injected, sclera clear LUNGS: clear to auscultation and percussion with normal breathing effort HEART: regular rate & rhythm and no murmurs and no lower extremity edema Musculoskeletal: no cyanosis of digits and no clubbing  PSYCH: alert & oriented x 3, fluent speech NEURO: no focal motor/sensory deficits  LABORATORY DATA:  I have reviewed the data as listed    Latest Ref Rng & Units 07/11/2024    9:13 AM 07/03/2024   10:15 AM 07/01/2024    8:57 AM  CBC  WBC 4.0 - 10.5 K/uL 9.7  13.3    Hemoglobin 12.0 - 15.0 g/dL 89.2  89.9  89.0   Hematocrit 36.0 - 46.0 % 33.1  30.9  32.0   Platelets 150 - 400 K/uL 303  354.0         Latest Ref Rng & Units  07/11/2024    9:13 AM 07/01/2024    8:57 AM 07/01/2024    8:44 AM  CMP  Glucose 70 - 99 mg/dL 82  887  885   BUN 8 - 23 mg/dL 11  10  13    Creatinine 0.44 - 1.00 mg/dL 8.91  8.89  8.94   Sodium 135 - 145 mmol/L 140  138  137   Potassium 3.5 - 5.1 mmol/L 3.9  4.2  4.1   Chloride 98 - 111 mmol/L 101  107  107   CO2 22 - 32 mmol/L 33   22   Calcium  8.9 - 10.3 mg/dL 9.1   9.3   Total Protein 6.5 - 8.1 g/dL 6.6     Total Bilirubin 0.0 - 1.2 mg/dL 0.4     Alkaline Phos 38 - 126 U/L 77     AST 15 - 41 U/L 14     ALT 0 - 44 U/L 14       RADIOGRAPHIC STUDIES: VAS US  GROIN PSEUDOANEURYSM Result Date: 07/01/2024  ARTERIAL PSEUDOANEURYSM  Patient Name:  JALILA GOODNOUGH  Date of Exam:    07/01/2024 Medical Rec #: [id number]            Accession #:    [id number] Date of Birth: 1956/04/27             Patient Gender: F Patient Age:   68 years Exam Location:  [facility] Procedure:      VAS US  GROIN PSEUDOANEURYSM Referring Phys: DAN FLOYD --------------------------------------------------------------------------------  Exam: Right groin Indications: Patient complains of groin pain and bruising. History: Recent right arterial puncture during recent hospitalization for GI bleed and possible IR intervention. Comparison Study: No prior study Performing Technologist: Alberta Lis RVS  Examination Guidelines: A complete evaluation includes B-mode imaging, spectral Doppler, color Doppler, and power Doppler as needed of all accessible portions of each vessel. Bilateral testing is considered an integral part of a complete examination. Limited examinations for reoccurring indications may be performed as noted.  Findings: A mixed echogenic structure measuring approximately 3.2 cm x 2.7 cm is visualized at the right anterior lateral thigh with ultrasound characteristics of a hematoma.  Summary: No evidence of pseudoaneurysm, AVF or DVT  Diagnosing physician: Gaile New MD Electronically signed by Gaile New MD on 07/01/2024 at 9:52:12 PM.   --------------------------------------------------------------------------------    Final    CT ABDOMEN PELVIS W CONTRAST Result Date: 07/01/2024 EXAM: CT ABDOMEN AND PELVIS WITH CONTRAST 07/01/2024 09:30:56 AM TECHNIQUE: CT of the abdomen and pelvis was performed with the administration of intravenous contrast. Multiplanar reformatted images are provided for review. Automated exposure control, iterative reconstruction, and/or weight-based adjustment of the mA/kV was utilized to reduce the radiation dose to as low as reasonably achievable. COMPARISON: 06/23/2024 CLINICAL HISTORY: Abdominal pain, acute, nonlocalized. Per triage notes: Pt coming from home for  possible groin hematoma per PCP visit yesterday. Pt had previous R groin arterial puncture in recent hospitalization. Pt having severe groin pain that has worsened today. FINDINGS: LOWER CHEST: Visualized lung bases are clear. LIVER: Normal size and contour. GALLBLADDER AND BILE DUCTS: No wall thickening. No cholelithiasis. No biliary ductal dilatation. SPLEEN: No splenic staple lines in the pelvis. PANCREAS: Stable mild pancreatic ductal dilatation without evident lesion or regional inflammatory change. ADRENAL GLANDS: Normal appearance. No mass. KIDNEYS, URETERS AND BLADDER: 2.2 cm 15 HU renal cortical cyst right mid kidney. Per consensus, no follow-up is needed for simple Bosniak type 1 and 2 renal cysts, unless  the patient has a malignancy history or risk factors. No stones in the kidneys or ureters. No hydronephrosis. No perinephric or periureteral stranding. Urinary bladder is unremarkable. GI AND BOWEL: Stomach demonstrates no acute abnormality. There is no bowel obstruction. No bowel wall thickening. Right lower quadrant ostomy. Infraumbilical rectus diastasis with a broad hernia containing a loop of small bowel, without obstruction or strangulation. PERITONEUM AND RETROPERITONEUM: No ascites. No free air. VASCULATURE: Retrievable infrarenal IVC filter in good location. Aorta is normal in caliber. LYMPH NODES: No lymphadenopathy. REPRODUCTIVE ORGANS: No significant abnormality. BONES AND SOFT TISSUES: Vertebral endplate spurring at multiple levels in the lower thoracic spine. Facet DJD L4 - S1. Right hip DJD. Mild inflammatory/ infiltrative changes overlying the right common femoral vessels presumably from venopuncture 06/28/24. No discrete hematoma or active extravasation. IMPRESSION: 1. Mild inflammatory changes overlying the right common femoral vessels, presumably from venous puncture 06/28/24. No discrete hematoma or active extravasation. 2. Infraumbilical rectus diastasis with a broad hernia containing  a loop of small bowel, without obstruction or strangulation. Electronically signed by: Katheleen Faes MD 07/01/2024 10:36 AM EDT RP Workstation: [id number]   IR IVC FILTER PLMT / S&I PORTER GUID/MOD SED Result Date: 06/29/2024 CLINICAL DATA:  recurrent pulmonary emboli despite anticoagulation. Caval filtration requested. No peripheral IV access could be obtained by RN. EXAM: INFERIOR VENACAVOGRAM IVC FILTER PLACEMENT UNDER FLUOROSCOPY CENTRAL VENOUS CATHETER PLACEMENT UNDER ULTRASOUND FLUOROSCOPY: Radiation Exposure Index (as provided by the fluoroscopic device): 30 mGy air Kerma TECHNIQUE: Patency of the right IJ vein was confirmed with ultrasound with image documentation. An appropriate skin site was determined. Skin site was marked, prepped with chlorhexidine , and draped using maximum barrier technique. The region was infiltrated locally with 1% lidocaine . Intravenous Fentanyl  50mcg and Versed  1mg  were administered by RN during a total moderate (conscious) sedation time of 11 minutes; the patient's level of consciousness and physiological / cardiorespiratory status were monitored continuously by radiology RN under my direct supervision. Under real-time ultrasound guidance, the right IJ vein was accessed with a 21 gauge micropuncture needle; the needle tip within the vein was confirmed with ultrasound image documentation. The needle was exchanged over a 018 guidewire for a transitional dilator, which allow advancement of the [facility] wire into the IVC. A long 6 French vascular sheath was placed for inferior venacavography. This demonstrated no caval thrombus. Renal vein inflows were evident. The retrievable Denali IVC filter was advanced through the sheath and successfully deployed under fluoroscopy at the L2 level. Followup cavagram demonstrates stable filter position and no evident complication. The sheath was exchanged over guidewire for a 5 French 14 cm single-lumen central venous catheter, position with its tip  at the cavoatrial junction, position confirmed under fluoroscopy. Catheter flushed, secured externally with 0 Prolene suture and covered with sterile dressing. The patient tolerated the procedure well. No immediate complication. IMPRESSION: 1. Normal IVC. No thrombus or significant anatomic variation. 2. Technically successful infrarenal IVC filter placement. This is a retrievable model. 3. Right IJ central venous catheter placement to the cavoatrial junction. PLAN: This IVC filter is potentially retrievable. The patient will be assessed for filter retrieval by Interventional Radiology in approximately 8-12 weeks. Further recommendations regarding filter retrieval, continued surveillance or declaration of device permanence, will be made at that time. Electronically Signed   By: JONETTA Faes M.D.   On: 06/29/2024 10:03   IR NON-TUNNELED CENTRAL VENOUS CATH [facility] W IMG Result Date: 06/29/2024 CLINICAL DATA:  recurrent pulmonary emboli despite anticoagulation. Caval filtration requested. No peripheral IV  access could be obtained by RN. EXAM: INFERIOR VENACAVOGRAM IVC FILTER PLACEMENT UNDER FLUOROSCOPY CENTRAL VENOUS CATHETER PLACEMENT UNDER ULTRASOUND FLUOROSCOPY: Radiation Exposure Index (as provided by the fluoroscopic device): 30 mGy air Kerma TECHNIQUE: Patency of the right IJ vein was confirmed with ultrasound with image documentation. An appropriate skin site was determined. Skin site was marked, prepped with chlorhexidine , and draped using maximum barrier technique. The region was infiltrated locally with 1% lidocaine . Intravenous Fentanyl  50mcg and Versed  1mg  were administered by RN during a total moderate (conscious) sedation time of 11 minutes; the patient's level of consciousness and physiological / cardiorespiratory status were monitored continuously by radiology RN under my direct supervision. Under real-time ultrasound guidance, the right IJ vein was accessed with a 21 gauge micropuncture needle; the  needle tip within the vein was confirmed with ultrasound image documentation. The needle was exchanged over a 018 guidewire for a transitional dilator, which allow advancement of the [facility] wire into the IVC. A long 6 French vascular sheath was placed for inferior venacavography. This demonstrated no caval thrombus. Renal vein inflows were evident. The retrievable Denali IVC filter was advanced through the sheath and successfully deployed under fluoroscopy at the L2 level. Followup cavagram demonstrates stable filter position and no evident complication. The sheath was exchanged over guidewire for a 5 French 14 cm single-lumen central venous catheter, position with its tip at the cavoatrial junction, position confirmed under fluoroscopy. Catheter flushed, secured externally with 0 Prolene suture and covered with sterile dressing. The patient tolerated the procedure well. No immediate complication. IMPRESSION: 1. Normal IVC. No thrombus or significant anatomic variation. 2. Technically successful infrarenal IVC filter placement. This is a retrievable model. 3. Right IJ central venous catheter placement to the cavoatrial junction. PLAN: This IVC filter is potentially retrievable. The patient will be assessed for filter retrieval by Interventional Radiology in approximately 8-12 weeks. Further recommendations regarding filter retrieval, continued surveillance or declaration of device permanence, will be made at that time. Electronically Signed   By: JONETTA Faes M.D.   On: 06/29/2024 10:03   ECHOCARDIOGRAM COMPLETE Result Date: 06/26/2024    ECHOCARDIOGRAM REPORT   Patient Name:   CATELYN FRIEL Date of Exam: 06/26/2024 Medical Rec #:  [id number]           Height:       63.0 in Accession #:    [id number]          Weight:       169.1 lb Date of Birth:  [birth year]            BSA:          1.800 m Patient Age:    68 years            BP:           138/79 mmHg Patient Gender: F                   HR:           63 bpm.  Exam Location:  Inpatient Procedure: 2D Echo, Cardiac Doppler and Color Doppler (Both Spectral and Color            Flow Doppler were utilized during procedure). Indications:    pulmonary embolus  History:        Patient has prior history of Echocardiogram examinations, most                 recent 04/22/2024.  Sonographer:    Therisa  Hershal Referring Phys: [id number] AMRIT ADHIKARI IMPRESSIONS  1. Left ventricular ejection fraction, by estimation, is 55 to 60%. The left ventricle has normal function. The left ventricle has no regional wall motion abnormalities. There is mild concentric left ventricular hypertrophy. Left ventricular diastolic parameters are consistent with Grade I diastolic dysfunction (impaired relaxation).  2. Right ventricular systolic function is normal. The right ventricular size is normal. There is normal pulmonary artery systolic pressure. The estimated right ventricular systolic pressure is 21.8 mmHg.  3. The mitral valve is normal in structure. Trivial mitral valve regurgitation. No evidence of mitral stenosis.  4. The aortic valve is tricuspid. Aortic valve regurgitation is not visualized. No aortic stenosis is present.  5. The inferior vena cava is normal in size with greater than 50% respiratory variability, suggesting right atrial pressure of 3 mmHg. FINDINGS  Left Ventricle: Left ventricular ejection fraction, by estimation, is 55 to 60%. The left ventricle has normal function. The left ventricle has no regional wall motion abnormalities. The left ventricular internal cavity size was normal in size. There is  mild concentric left ventricular hypertrophy. Left ventricular diastolic parameters are consistent with Grade I diastolic dysfunction (impaired relaxation). Right Ventricle: The right ventricular size is normal. No increase in right ventricular wall thickness. Right ventricular systolic function is normal. There is normal pulmonary artery systolic pressure. The tricuspid regurgitant  velocity is 2.17 m/s, and  with an assumed right atrial pressure of 3 mmHg, the estimated right ventricular systolic pressure is 21.8 mmHg. Left Atrium: Left atrial size was normal in size. Right Atrium: Right atrial size was normal in size. Pericardium: There is no evidence of pericardial effusion. Mitral Valve: The mitral valve is normal in structure. Trivial mitral valve regurgitation. No evidence of mitral valve stenosis. Tricuspid Valve: The tricuspid valve is normal in structure. Tricuspid valve regurgitation is trivial. Aortic Valve: The aortic valve is tricuspid. Aortic valve regurgitation is not visualized. No aortic stenosis is present. Pulmonic Valve: The pulmonic valve was normal in structure. Pulmonic valve regurgitation is not visualized. Aorta: The aortic root is normal in size and structure. Venous: The inferior vena cava is normal in size with greater than 50% respiratory variability, suggesting right atrial pressure of 3 mmHg. IAS/Shunts: No atrial level shunt detected by color flow Doppler.  LEFT VENTRICLE PLAX 2D LVIDd:         3.40 cm   Diastology LVIDs:         2.10 cm   LV e' medial:    10.60 cm/s LV PW:         1.00 cm   LV E/e' medial:  7.4 LV IVS:        1.10 cm   LV e' lateral:   11.90 cm/s LVOT diam:     1.90 cm   LV E/e' lateral: 6.6 LVOT Area:     2.84 cm  RIGHT VENTRICLE             IVC RV Basal diam:  2.80 cm     IVC diam: 1.90 cm RV S prime:     11.40 cm/s TAPSE (M-mode): 2.8 cm LEFT ATRIUM             Index        RIGHT ATRIUM           Index LA diam:        2.20 cm 1.22 cm/m   RA Area:     17.40 cm LA Vol (A2C):  51.6 ml 28.66 ml/m  RA Volume:   42.70 ml  23.72 ml/m LA Vol (A4C):   43.9 ml 24.38 ml/m LA Biplane Vol: 47.8 ml 26.55 ml/m   AORTA Ao Root diam: 3.00 cm Ao Asc diam:  3.60 cm MITRAL VALVE               TRICUSPID VALVE MV Area (PHT): 3.60 cm    TR Peak grad:   18.8 mmHg MV Decel Time: 211 msec    TR Vmax:        217.00 cm/s MV E velocity: 78.00 cm/s MV A velocity:  71.10 cm/s  SHUNTS MV E/A ratio:  1.10        Systemic Diam: 1.90 cm Dalton McleanMD Electronically signed by Ezra Kanner Signature Date/Time: 06/26/2024/9:01:14 PM    Final    DG Abd 1 View Result Date: 06/26/2024 CLINICAL DATA:  Evaluate for foreign body.  Retained capsule. EXAM: ABDOMEN - 1 VIEW COMPARISON:  Abdominal x-ray 06/26/2024. FINDINGS: Previous identified rectangular foreign body overlying the colostomy is no longer seen. Surgical anastomotic staple line is again noted in the pelvis. There is contrast in the bladder and renal collecting systems. A can give a are likely external artifact is overlying the lower right hemipelvis which appears unchanged from prior. External snap artifact overlies the lateral left abdominal wall. No new unexpected radiopaque foreign body identified. Bowel-gas pattern is nonobstructive. No suspicious calcifications are seen. IMPRESSION: Previous identified rectangular foreign body overlying the colostomy is no longer seen. No new unexpected radiopaque foreign body identified. Electronically Signed   By: Greig Pique M.D.   On: 06/26/2024 19:18   CT Angio Chest Pulmonary Embolism (PE) W or WO Contrast Result Date: 06/26/2024 CLINICAL DATA:  History of bilateral DVTs, on Eliquis  EXAM: CT ANGIOGRAPHY CHEST WITH CONTRAST TECHNIQUE: Multidetector CT imaging of the chest was performed using the standard protocol during bolus administration of intravenous contrast. Multiplanar CT image reconstructions and MIPs were obtained to evaluate the vascular anatomy. RADIATION DOSE REDUCTION: This exam was performed according to the departmental dose-optimization program which includes automated exposure control, adjustment of the mA and/or kV according to patient size and/or use of iterative reconstruction technique. CONTRAST:  75mL OMNIPAQUE  IOHEXOL  350 MG/ML SOLN COMPARISON:  April 20, 2024 FINDINGS: Pulmonary Embolism: Resolution of the acute embolus within the interlobar  pulmonary artery in the right lung. Small amount of residual distal segmental/subsegmental embolus in the basilar segments of the right lower lobe. Increasing clot burden within the left upper lobe lingular branch, extending into the interlobar pulmonary artery and into the lobar branch of the left lower lobe. The distal segmental and subsegmental emboli in the left lower lobe on the prior study have resolved. Cardiovascular: No cardiomegaly or pericardial effusion.No aortic aneurysm. Subtle reflux of contrast into the hepatic veins. Mediastinum/Nodes: No mediastinal mass.Similar heterogeneous enlargement of the right thyroid  lobe measuring 4.5 cm in AP dimension.No mediastinal, hilar, or axillary lymphadenopathy. Lungs/Pleura: The midline trachea and bronchi are patent. Posterior bibasilar dependent atelectasis. No focal airspace consolidation, pleural effusion, or pneumothorax. Musculoskeletal: No acute fracture or destructive bone lesion. Multilevel degenerative disc disease of the spine. Osteopenia. Upper Abdomen: No acute abnormality in the partially visualized upper abdomen. Review of the MIP images confirms the above findings. IMPRESSION: 1. Although there has been interval resolution of some of the pulmonary emboli on the prior study, there are new emboli in the left upper lobe lingular branch and left interlobar pulmonary arteries. While there is subtle reflux  of contrast into the hepatic veins, as can be seen in elevated right heart pressures or right heart strain, the RV to LV ratio remains normal and there are no additional findings of right heart strain. Laboratory correlation recommended. If this remains of clinical concern, a follow-up echocardiogram should be considered. 2. No pneumonia, pulmonary edema, or pleural effusion. 3. Similar appearance of the heterogeneously enlarged right thyroid  lobe. If not previously evaluated, a nonemergent thyroid  ultrasound is recommended for further  characterization. Critical Value/emergent results were called by telephone at the time of interpretation on 06/26/2024 at 1501 to provider Dr. Jillian, who verbally acknowledged these results. Electronically Signed   By: Rogelia Myers M.D.   On: 06/26/2024 15:01   DG Abd 1 View Result Date: 06/26/2024 CLINICAL DATA:  [id number] Foreign body alimentary tract, subsequent encounter [phone]. EXAM: ABDOMEN - 1 VIEW COMPARISON:  01/16/2020. FINDINGS: The bowel gas pattern is non-obstructive. Bowel anastomotic suture noted overlying the lower sacrum region. No evidence of pneumoperitoneum, within the limitations of a supine film. No acute osseous abnormalities. The soft tissues are within normal limits. Surgical changes, devices, tubes and lines: There is a rectangular 1.3 x 2.0 cm opacity overlying the right lower quadrant region superimposed on colostomy ring. It is unclear whether this is external to the patient. Correlate with physical examination. IMPRESSION: Nonobstructive bowel gas pattern. There is a rectangular 1.3 x 2.0 cm opacity overlying the right lower quadrant region superimposed on colostomy ring. It is unclear whether this is external to the patient. Correlate with physical examination. Electronically Signed   By: Ree Molt M.D.   On: 06/26/2024 11:40   VAS US  LOWER EXTREMITY VENOUS (DVT) Result Date: 06/25/2024  Lower Venous DVT Study Patient Name:  NIMRAT WOOLWORTH  Date of Exam:   06/25/2024 Medical Rec #: [id number]            Accession #:    [id number] Date of Birth: 03-23-1956             Patient Gender: F Patient Age:   71 years Exam Location:  [facility] Procedure:      VAS US  LOWER EXTREMITY VENOUS (DVT) Referring Phys: DORN CHILL --------------------------------------------------------------------------------  Indications: Pulmonary embolism.  Risk Factors: DVT. Limitations: Bandages, poor ultrasound/tissue interface and line. Comparison Study: 04/21/2024 - RIGHT:                    - Findings consistent with acute deep vein thrombosis                   involving the right peroneal veins.                   - No cystic structure found in the popliteal fossa.                    LEFT:                   - Findings consistent with acute, non occlusive DVT noted in                   the left proximal femoral vein and profunda vein.                   - No cystic structure found in the popliteal fossa. Performing Technologist: Cordella COLLET RVT  Examination Guidelines: A complete evaluation includes B-mode imaging, spectral Doppler, color Doppler, and power Doppler as needed of all  accessible portions of each vessel. Bilateral testing is considered an integral part of a complete examination. Limited examinations for reoccurring indications may be performed as noted. The reflux portion of the exam is performed with the patient in reverse Trendelenburg.  +---------+---------------+---------+-----------+----------+--------------+ RIGHT    CompressibilityPhasicitySpontaneityPropertiesThrombus Aging +---------+---------------+---------+-----------+----------+--------------+ CFV      Full           Yes      Yes                                 +---------+---------------+---------+-----------+----------+--------------+ SFJ      Full                                                        +---------+---------------+---------+-----------+----------+--------------+ FV Prox  Full                                                        +---------+---------------+---------+-----------+----------+--------------+ FV Mid   Full                                                        +---------+---------------+---------+-----------+----------+--------------+ FV DistalFull                                                        +---------+---------------+---------+-----------+----------+--------------+ PFV      Full                                                         +---------+---------------+---------+-----------+----------+--------------+ POP      Full           Yes      Yes                                 +---------+---------------+---------+-----------+----------+--------------+ PTV      Full                                                        +---------+---------------+---------+-----------+----------+--------------+ PERO     Full                                                        +---------+---------------+---------+-----------+----------+--------------+   +---------+---------------+---------+-----------+----------+--------------+ LEFT  CompressibilityPhasicitySpontaneityPropertiesThrombus Aging +---------+---------------+---------+-----------+----------+--------------+ CFV      Full           Yes      Yes                                 +---------+---------------+---------+-----------+----------+--------------+ SFJ      Full                                                        +---------+---------------+---------+-----------+----------+--------------+ FV Prox  Full                                                        +---------+---------------+---------+-----------+----------+--------------+ FV Mid   Full                                                        +---------+---------------+---------+-----------+----------+--------------+ FV DistalFull                                                        +---------+---------------+---------+-----------+----------+--------------+ PFV      Full                                                        +---------+---------------+---------+-----------+----------+--------------+ POP      Full           Yes      Yes                                 +---------+---------------+---------+-----------+----------+--------------+ PTV      Full                                                         +---------+---------------+---------+-----------+----------+--------------+ PERO     Full                                                        +---------+---------------+---------+-----------+----------+--------------+     Summary: RIGHT: - There is no evidence of deep vein thrombosis in the lower extremity.  - No cystic structure found in the popliteal fossa.  LEFT: - There is no evidence of deep vein thrombosis in the lower extremity.  - No cystic structure found in the  popliteal fossa.  *See table(s) above for measurements and observations. Electronically signed by Penne Colorado MD on 06/25/2024 at 11:42:43 AM.    Final    IR Angiogram Visceral Selective Result Date: 06/24/2024 INDICATION: GI bleed.  Question small bowel extravasation on CTA. EXAM: Title: MESENTERIC ARTERIOGRAM Procedures: 1. ULTRASOUND-GUIDED RIGHT COMMON FEMORAL ARTERIAL ACCESS 2. MESENTERIC ARTERIOGRAPHY, including SMA and SMA BRANCH SELECTIVE ANGIOGRAPHY COMPARISON:  CTA AP, 06/23/2024 and 06/03/2024. MEDICATIONS: None ANESTHESIA/SEDATION: Local anesthetic and single agent sedation was employed during this procedure. A total of fentanyl  100 mcg was administered intravenously. The patient's level of consciousness and vital signs were monitored continuously by radiology nursing throughout the procedure under my direct supervision. CONTRAST:  50 mL Omnipaque  300 FLUOROSCOPY: Radiation Exposure Index and estimated peak skin dose (PSD); Reference air kerma (RAK), 112 mGy. COMPLICATIONS: None immediate. PROCEDURE: Informed consent was obtained from the patient and/or patient's representative following explanation of the procedure, risks, benefits and alternatives. All questions were addressed. A time out was performed prior to the initiation of the procedure. Maximal barrier sterile technique utilized including caps, mask, sterile gowns, sterile gloves, large sterile drape, hand hygiene, and chlorhexidine  prep. The RIGHT femoral head was  marked fluoroscopically. Ultrasound-guided access of the RIGHT femoral artery was obtained, allowing placement of a 5 Fr, 35 cm Brite tip vascular sheath. An ultrasound image was saved to PACS. A limited arteriogram was performed through the side arm of the sheath confirming appropriate access within the RIGHT common femoral artery. Over a Bentson wire, a Mickelson catheter was advanced the caudal aspect of the thoracic aorta where was reformed, back bled and flushed. The catheter was then utilized to select the superior mesenteric artery and selective arteriogram was performed. Distal SMA branch access was obtained with a 2.4 Fr Progreat alpha microcatheter and 0.016 inch Fathom microwire. Super selective arteriograms were performed. Images were reviewed and the procedure was terminated. All wires and catheters were removed from the patient. Hemostasis was achieved at the RIGHT groin access site with Angio-Seal closure device. The patient tolerated the procedure well without immediate post procedural complication. FINDINGS: *Access via the RIGHT femoral artery. *No active extravasation of contrast on mesenteric arteriography, either from the SMA or distal SMA branch on selective or super selective angiography. IMPRESSION: 1. Mesenteric arteriography, including SMA and distal SMA branch super selective interrogation. 2. No active contrast extravasation on catheter angiography. No embolization was performed. Thom Hall, MD [facility] Radiology Electronically Signed   By: Thom Hall M.D.   On: 06/24/2024 14:27   IR US  Guide Vasc Access Right Result Date: 06/24/2024 INDICATION: GI bleed.  Question small bowel extravasation on CTA. EXAM: Title: MESENTERIC ARTERIOGRAM Procedures: 1. ULTRASOUND-GUIDED RIGHT COMMON FEMORAL ARTERIAL ACCESS 2. MESENTERIC ARTERIOGRAPHY, including SMA and SMA BRANCH SELECTIVE ANGIOGRAPHY COMPARISON:  CTA AP, 06/23/2024 and 06/03/2024.  MEDICATIONS: None ANESTHESIA/SEDATION: Local anesthetic and single agent sedation was employed during this procedure. A total of fentanyl  100 mcg was administered intravenously. The patient's level of consciousness and vital signs were monitored continuously by radiology nursing throughout the procedure under my direct supervision. CONTRAST:  50 mL Omnipaque  300 FLUOROSCOPY: Radiation Exposure Index and estimated peak skin dose (PSD); Reference air kerma (RAK), 112 mGy. COMPLICATIONS: None immediate. PROCEDURE: Informed consent was obtained from the patient and/or patient's representative following explanation of the procedure, risks, benefits and alternatives. All questions were addressed. A time out was performed prior to the initiation of the procedure. Maximal barrier sterile technique utilized including caps,  mask, sterile gowns, sterile gloves, large sterile drape, hand hygiene, and chlorhexidine  prep. The RIGHT femoral head was marked fluoroscopically. Ultrasound-guided access of the RIGHT femoral artery was obtained, allowing placement of a 5 Fr, 35 cm Brite tip vascular sheath. An ultrasound image was saved to PACS. A limited arteriogram was performed through the side arm of the sheath confirming appropriate access within the RIGHT common femoral artery. Over a Bentson wire, a Mickelson catheter was advanced the caudal aspect of the thoracic aorta where was reformed, back bled and flushed. The catheter was then utilized to select the superior mesenteric artery and selective arteriogram was performed. Distal SMA branch access was obtained with a 2.4 Fr Progreat alpha microcatheter and 0.016 inch Fathom microwire. Super selective arteriograms were performed. Images were reviewed and the procedure was terminated. All wires and catheters were removed from the patient. Hemostasis was achieved at the RIGHT groin access site with Angio-Seal closure device. The patient tolerated the procedure well without immediate  post procedural complication. FINDINGS: *Access via the RIGHT femoral artery. *No active extravasation of contrast on mesenteric arteriography, either from the SMA or distal SMA branch on selective or super selective angiography. IMPRESSION: 1. Mesenteric arteriography, including SMA and distal SMA branch super selective interrogation. 2. No active contrast extravasation on catheter angiography. No embolization was performed. Thom Hall, MD [facility] Radiology Electronically Signed   By: Thom Hall M.D.   On: 06/24/2024 14:27   IR Angiogram Selective Each Additional Vessel Result Date: 06/24/2024 INDICATION: GI bleed.  Question small bowel extravasation on CTA. EXAM: Title: MESENTERIC ARTERIOGRAM Procedures: 1. ULTRASOUND-GUIDED RIGHT COMMON FEMORAL ARTERIAL ACCESS 2. MESENTERIC ARTERIOGRAPHY, including SMA and SMA BRANCH SELECTIVE ANGIOGRAPHY COMPARISON:  CTA AP, 06/23/2024 and 06/03/2024. MEDICATIONS: None ANESTHESIA/SEDATION: Local anesthetic and single agent sedation was employed during this procedure. A total of fentanyl  100 mcg was administered intravenously. The patient's level of consciousness and vital signs were monitored continuously by radiology nursing throughout the procedure under my direct supervision. CONTRAST:  50 mL Omnipaque  300 FLUOROSCOPY: Radiation Exposure Index and estimated peak skin dose (PSD); Reference air kerma (RAK), 112 mGy. COMPLICATIONS: None immediate. PROCEDURE: Informed consent was obtained from the patient and/or patient's representative following explanation of the procedure, risks, benefits and alternatives. All questions were addressed. A time out was performed prior to the initiation of the procedure. Maximal barrier sterile technique utilized including caps, mask, sterile gowns, sterile gloves, large sterile drape, hand hygiene, and chlorhexidine  prep. The RIGHT femoral head was marked fluoroscopically.  Ultrasound-guided access of the RIGHT femoral artery was obtained, allowing placement of a 5 Fr, 35 cm Brite tip vascular sheath. An ultrasound image was saved to PACS. A limited arteriogram was performed through the side arm of the sheath confirming appropriate access within the RIGHT common femoral artery. Over a Bentson wire, a Mickelson catheter was advanced the caudal aspect of the thoracic aorta where was reformed, back bled and flushed. The catheter was then utilized to select the superior mesenteric artery and selective arteriogram was performed. Distal SMA branch access was obtained with a 2.4 Fr Progreat alpha microcatheter and 0.016 inch Fathom microwire. Super selective arteriograms were performed. Images were reviewed and the procedure was terminated. All wires and catheters were removed from the patient. Hemostasis was achieved at the RIGHT groin access site with Angio-Seal closure device. The patient tolerated the procedure well without immediate post procedural complication. FINDINGS: *Access via the RIGHT femoral artery. *No active extravasation of contrast on mesenteric  arteriography, either from the SMA or distal SMA branch on selective or super selective angiography. IMPRESSION: 1. Mesenteric arteriography, including SMA and distal SMA branch super selective interrogation. 2. No active contrast extravasation on catheter angiography. No embolization was performed. Thom Hall, MD [facility] Radiology Electronically Signed   By: Thom Hall M.D.   On: 06/24/2024 14:27   IR Angiogram Selective Each Additional Vessel Result Date: 06/24/2024 INDICATION: GI bleed.  Question small bowel extravasation on CTA. EXAM: Title: MESENTERIC ARTERIOGRAM Procedures: 1. ULTRASOUND-GUIDED RIGHT COMMON FEMORAL ARTERIAL ACCESS 2. MESENTERIC ARTERIOGRAPHY, including SMA and SMA BRANCH SELECTIVE ANGIOGRAPHY COMPARISON:  CTA AP, 06/23/2024 and 06/03/2024. MEDICATIONS:  None ANESTHESIA/SEDATION: Local anesthetic and single agent sedation was employed during this procedure. A total of fentanyl  100 mcg was administered intravenously. The patient's level of consciousness and vital signs were monitored continuously by radiology nursing throughout the procedure under my direct supervision. CONTRAST:  50 mL Omnipaque  300 FLUOROSCOPY: Radiation Exposure Index and estimated peak skin dose (PSD); Reference air kerma (RAK), 112 mGy. COMPLICATIONS: None immediate. PROCEDURE: Informed consent was obtained from the patient and/or patient's representative following explanation of the procedure, risks, benefits and alternatives. All questions were addressed. A time out was performed prior to the initiation of the procedure. Maximal barrier sterile technique utilized including caps, mask, sterile gowns, sterile gloves, large sterile drape, hand hygiene, and chlorhexidine  prep. The RIGHT femoral head was marked fluoroscopically. Ultrasound-guided access of the RIGHT femoral artery was obtained, allowing placement of a 5 Fr, 35 cm Brite tip vascular sheath. An ultrasound image was saved to PACS. A limited arteriogram was performed through the side arm of the sheath confirming appropriate access within the RIGHT common femoral artery. Over a Bentson wire, a Mickelson catheter was advanced the caudal aspect of the thoracic aorta where was reformed, back bled and flushed. The catheter was then utilized to select the superior mesenteric artery and selective arteriogram was performed. Distal SMA branch access was obtained with a 2.4 Fr Progreat alpha microcatheter and 0.016 inch Fathom microwire. Super selective arteriograms were performed. Images were reviewed and the procedure was terminated. All wires and catheters were removed from the patient. Hemostasis was achieved at the RIGHT groin access site with Angio-Seal closure device. The patient tolerated the procedure well without immediate post  procedural complication. FINDINGS: *Access via the RIGHT femoral artery. *No active extravasation of contrast on mesenteric arteriography, either from the SMA or distal SMA branch on selective or super selective angiography. IMPRESSION: 1. Mesenteric arteriography, including SMA and distal SMA branch super selective interrogation. 2. No active contrast extravasation on catheter angiography. No embolization was performed. Thom Hall, MD [facility] Radiology Electronically Signed   By: Thom Hall M.D.   On: 06/24/2024 14:27   CT Angio Abd/Pel W and/or Wo Contrast Result Date: 06/23/2024 CLINICAL DATA:  Lower gastrointestinal bleeding. EXAM: CTA ABDOMEN AND PELVIS WITHOUT AND WITH CONTRAST TECHNIQUE: Multidetector CT imaging of the abdomen and pelvis was performed using the standard protocol during bolus administration of intravenous contrast. Multiplanar reconstructed images and MIPs were obtained and reviewed to evaluate the vascular anatomy. RADIATION DOSE REDUCTION: This exam was performed according to the departmental dose-optimization program which includes automated exposure control, adjustment of the mA and/or kV according to patient size and/or use of iterative reconstruction technique. CONTRAST:  80mL OMNIPAQUE  IOHEXOL  350 MG/ML SOLN COMPARISON:  January 15, 2020. FINDINGS: VASCULAR Aorta: Atherosclerosis of abdominal aorta without aneurysm or dissection. Celiac: Patent  without evidence of aneurysm, dissection, vasculitis or significant stenosis. SMA: Patent without evidence of aneurysm, dissection, vasculitis or significant stenosis. Renals: Both renal arteries are patent without evidence of aneurysm, dissection, vasculitis, fibromuscular dysplasia or significant stenosis. IMA: Not definitively visualized. Inflow: Patent without evidence of aneurysm, dissection, vasculitis or significant stenosis. Proximal Outflow: Bilateral common femoral and visualized  portions of the superficial and profunda femoral arteries are patent without evidence of aneurysm, dissection, vasculitis or significant stenosis. Veins: No obvious venous abnormality within the limitations of this arterial phase study. Review of the MIP images confirms the above findings. NON-VASCULAR Lower chest: No acute abnormality. Hepatobiliary: No focal liver abnormality is seen. No gallstones, gallbladder wall thickening, or biliary dilatation. Pancreas: Unremarkable. No pancreatic ductal dilatation or surrounding inflammatory changes. Spleen: Normal in size without focal abnormality. Adrenals/Urinary Tract: Adrenal glands appear normal. Right renal cyst is noted. No hydronephrosis or renal obstruction is noted. Urinary bladder is unremarkable. Stomach/Bowel: The stomach is unremarkable. Status post total colectomy. Ileostomy is noted in right lower quadrant. There is no evidence of bowel obstruction or inflammation. Dilated segment of small bowel is seen in area of surgical anastomosis. There does appear to be accumulation of high density material in this area on later images suggesting possible small focus of gastrointestinal bleeding. This is best seen on image number 58 of series 18. Lymphatic: No adenopathy is noted. Reproductive: Status post hysterectomy. No adnexal masses. Other: Small infraumbilical ventral hernia is noted which contains a loop of small bowel, but does not result in obstruction. No ascites. Musculoskeletal: No acute or significant osseous findings. IMPRESSION: VASCULAR Probable accumulation of high density material seen in dilated segment of small bowel in pelvis near surgical anastomosis. This is concerning for possible focus of gastrointestinal bleeding. Critical Value/emergent results were called by telephone at the time of interpretation on 06/23/2024 at 12:57 pm to provider [facility] , who verbally acknowledged these results. Aortic Atherosclerosis (ICD10-I70.0). 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 Small infraumbilical ventral hernia is noted. Electronically Signed   By: Lynwood Landy Raddle M.D.   On: 06/23/2024 12:57    ASSESSMENT & PLAN Latasha Wood Collet 68 y.o. female with medical history significant for DVT/PET who presents for a follow up visit.  A provoked venous thromboembolism (VTE) is one that has a clear inciting factor or event. Provoking factors include prolonged travel/immobility, surgery (particular abdominal or orthropedic), trauma,  and pregnancy/ estrogen containing birth control. After a detailed history and review of the records there is no clear provoking factor for this patient's VTE.  Patients with unprovoked VTEs have up to 25% recurrence after 5 years and 36% at 10 years, with 4% of these clots being fatal ([phone]). Therefore the formal recommendation for unprovoked VTE's is lifelong anticoagulation, as the cause may not be transient or reversible. We recommend 6 months or full strength anticoagulation with a re-evaluation after that time.  The patient's will then have a choice of maintenance dose DOAC (preferred, recommended), 81mg  ASA PO daily (non-preferred), or no further anticoagulation (not recommended).    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  --recommend the patient continue to hold eliquis  5mg  BID in the setting of suspected GI bleed --Patient currently has IVC filter in place.  Put in place during hospitalization August 2025 --Labs today show white blood cell 9.7, hemoglobin 10.7, MCV 96.2, platelets 303.  Iron  studies pending. --RTC in 3 months' time with strict return precautions for overt signs of bleeding.  Interval labs in 6 weeks time  # Acute Anemia # Suspected GI Bleeding -- Patient was admitted due to dark tarry stools and precipitous drop in  hemoglobin.  Endoscopic evaluation did not reveal source of bleeding. --Today we will order iron  studies to assure patient has adequate levels of iron  stores. --Strict return precautions for any signs or symptoms concerning for worsening bleed. --Will perform monthly labs to continue monitoring closely.   #Right thyroid  mass: --Under the care of [facility] ENT team, last seen by Dr. Murry in April 2025.  --FNA of right thyroid  mass from January 2021 was consistent with benign follicular nodule   No orders of the defined types were placed in this encounter.  No orders of the defined types were placed in this encounter.   All questions were answered. The patient knows to call the clinic with any problems, questions or concerns.  A total of more than 30 minutes were spent on this encounter with face-to-face time and non-face-to-face time, including preparing to see the patient, ordering tests and/or medications, counseling the patient and coordination of care as outlined above.   Norleen IVAR Kidney, MD Department of Hematology/Oncology [facility] at [facility] Phone: [phone] Pager: [phone] Email: [email or web address]  07/11/2024 7:30 PM

## 2024-07-11 NOTE — Patient Instructions (Signed)
 Thank you for choosing [facility] to provide your care. If you have questions after your visit to [facility]), please contact this office at [phone] between 8:00 am and 4 pm and select the name of your provider. Voicemails left after 4:00 may not be returned until the next business day. Calls received after 4:30 pm will be answered by an off-site nursing triage line.    Prescription Refills: Please ask your pharmacy to contact us  directly for most prescription requests. Please contact the office directly to refill narcotics (pain medications). Allow 48-72 hours for refills.  Appointments: Please contact the [facility] scheduling department at [phone] if you have questions about [facility] appointment scheduling. Please contact the schedulers with any scheduling changes so your appointment can be rescheduled in a timely manner.  Central Radiology Scheduling for [facility] [phone]  Call to schedule procedures such as PET scans, CT scans, MRIs, ultrasound, etc.  To allow each patient quality time with our providers, please arrive 15-30 minutes before your scheduled appointment time to allow for registration.  If you are late for your appointment, you may be asked to reschedule. We strive to provide you with quality time with our providers, and arriving late affects you and other patients whose appointments are after yours. If you do not show up for multiple scheduled visits, you may be dismissed from the clinic at the provider's discretion.  Resources: [facility] Social Workers [phone] for additional information on assistance programs or assistance connecting with community support programs [place] DSS [phone]: Information about food stamps, Medicaid, and utility assistance [employer] [phone])[phone]: [employer] ride-sharing service for eligible riders who have a disability that prevents them from riding the fixed-route  bus. Medicare Rights Center [phone]: Helps people with Medicare understand their rights and benefits, navigate the Medicare system, and get the quality health care they deserve [employer] (737)009-4183: Assists patients to locate various types of support and financial assistance [facility] 1-800-813-HOPE 407 568 9681): Provides financial assistance, online support groups, medication/co-pay assistance. Transportation Assistance for [employer]: Please inform the provider's scheduler or support staff to refer you to [employer].  Again, thank you for choosing [facility] for your care

## 2024-07-18 ENCOUNTER — Ambulatory Visit: Admitting: Sports Medicine

## 2024-07-18 ENCOUNTER — Inpatient Hospital Stay

## 2024-07-25 ENCOUNTER — Other Ambulatory Visit: Payer: Self-pay | Admitting: Internal Medicine

## 2024-07-25 DIAGNOSIS — I10 Essential (primary) hypertension: Secondary | ICD-10-CM

## 2024-07-27 ENCOUNTER — Encounter (HOSPITAL_COMMUNITY): Payer: Self-pay

## 2024-07-27 ENCOUNTER — Emergency Department (HOSPITAL_COMMUNITY)
Admission: EM | Admit: 2024-07-27 | Discharge: 2024-07-27 | Disposition: A | Discharge status: Home / Self Care | Attending: Emergency Medicine | Admitting: Emergency Medicine

## 2024-07-27 ENCOUNTER — Emergency Department (HOSPITAL_COMMUNITY)

## 2024-07-27 ENCOUNTER — Ambulatory Visit: Payer: Self-pay

## 2024-07-27 ENCOUNTER — Other Ambulatory Visit: Payer: Self-pay

## 2024-07-27 DIAGNOSIS — N281 Cyst of kidney, acquired: Secondary | ICD-10-CM | POA: Diagnosis not present

## 2024-07-27 DIAGNOSIS — R109 Unspecified abdominal pain: Secondary | ICD-10-CM | POA: Diagnosis not present

## 2024-07-27 DIAGNOSIS — R0602 Shortness of breath: Secondary | ICD-10-CM | POA: Insufficient documentation

## 2024-07-27 DIAGNOSIS — Z91040 Latex allergy status: Secondary | ICD-10-CM | POA: Insufficient documentation

## 2024-07-27 DIAGNOSIS — R188 Other ascites: Secondary | ICD-10-CM | POA: Diagnosis not present

## 2024-07-27 DIAGNOSIS — I2699 Other pulmonary embolism without acute cor pulmonale: Secondary | ICD-10-CM | POA: Diagnosis not present

## 2024-07-27 DIAGNOSIS — Z7901 Long term (current) use of anticoagulants: Secondary | ICD-10-CM | POA: Diagnosis not present

## 2024-07-27 DIAGNOSIS — E049 Nontoxic goiter, unspecified: Secondary | ICD-10-CM | POA: Diagnosis not present

## 2024-07-27 LAB — BASIC METABOLIC PANEL WITH GFR
Anion gap: 14 (ref 5–15)
BUN: 20 mg/dL (ref 8–23)
CO2: 24 mmol/L (ref 22–32)
Calcium: 10 mg/dL (ref 8.9–10.3)
Chloride: 101 mmol/L (ref 98–111)
Creatinine, Ser: 1.02 mg/dL — ABNORMAL HIGH (ref 0.44–1.00)
GFR, Estimated: 59 mL/min — ABNORMAL LOW (ref >60)
Glucose, Bld: 95 mg/dL (ref 70–99)
Potassium: 3.1 mmol/L — ABNORMAL LOW (ref 3.5–5.1)
Sodium: 139 mmol/L (ref 135–145)

## 2024-07-27 LAB — CBC
HCT: 37.6 % (ref 36.0–46.0)
HCT: 38.1 % (ref 36.0–46.0)
Hemoglobin: 12.1 g/dL (ref 12.0–15.0)
Hemoglobin: 12 g/dL (ref 12.0–15.0)
MCH: 27.6 pg (ref 26.0–34.0)
MCH: 27.3 pg (ref 26.0–34.0)
MCHC: 32.2 g/dL (ref 30.0–36.0)
MCHC: 31.5 g/dL (ref 30.0–36.0)
MCV: 85.8 fL (ref 80.0–100.0)
MCV: 86.8 fL (ref 80.0–100.0)
Platelets: 311 K/uL (ref 150–400)
Platelets: 323 K/uL (ref 150–400)
RBC: 4.38 MIL/uL (ref 3.87–5.11)
RBC: 4.39 MIL/uL (ref 3.87–5.11)
RDW: 15.5 % (ref 11.5–15.5)
RDW: 15.5 % (ref 11.5–15.5)
WBC: 5.6 K/uL (ref 4.0–10.5)
WBC: 6.6 K/uL (ref 4.0–10.5)
nRBC: 0 % (ref 0.0–0.2)
nRBC: 0 % (ref 0.0–0.2)

## 2024-07-27 LAB — TROPONIN T, HIGH SENSITIVITY: Troponin T High Sensitivity: <15 ng/L (ref 0–19)

## 2024-07-27 MED ORDER — IOHEXOL 350 MG/ML SOLN
75.0000 mL | Freq: Once | INTRAVENOUS | Status: AC | PRN
  Administered 2024-07-27: 75 mL via INTRAVENOUS

## 2024-07-27 MED ORDER — SODIUM CHLORIDE 0.9 % IV BOLUS
1000.0000 mL | Freq: Once | INTRAVENOUS | Status: AC
  Administered 2024-07-27: 1000 mL via INTRAVENOUS

## 2024-07-27 NOTE — ED Provider Notes (Signed)
 [facility] EMERGENCY DEPARTMENT AT [facility] Provider Note   CSN: [id number] Arrival date & time: 07/27/24  1255     Patient presents with: Shortness of Breath   Latasha Wood is a 68 y.o. female.   68 yo F with a chief complaints of shortness of breath.  This been going on for about 72 hours.  She unfortunately has had recent health problems that are brought her to the hospital.  She developed a pulmonary embolism and required hospitalization and then had 2 subsequent GI bleeds.  She had an IVC filter placed and has been taken off of Eliquis .  She denies any chest pain or pressure.  Has been coughing but just a little bit.  She feels like she has some bubbling out of her ostomy site but denies any pain to the abdomen.  She was seen in the ED recently for hematoma that was postprocedural to the right groin site.   Shortness of Breath      Prior to Admission medications   Medication Sig Start Date End Date Taking? Authorizing Provider  ALPRAZolam  (XANAX ) 0.25 MG tablet Take 1 tablet (0.25 mg total) by mouth 2 (two) times daily as needed for anxiety. 07/06/24   Joshua Debby CROME, MD  apixaban  (ELIQUIS ) 5 MG TABS tablet Take 1 tablet (5 mg total) by mouth 2 (two) times daily. 05/02/24   Thayil, Irene T, PA-C  augmented betamethasone dipropionate (DIPROLENE-AF) 0.05 % cream Apply 1 Application topically 2 (two) times daily as needed (Dermatitis). 06/01/24   [provider]  cholecalciferol (VITAMIN D3) 25 MCG (1000 UNIT) tablet Take 1,000 Units by mouth daily.    [provider]  hydrocortisone  2.5 % cream APPLY TOPICALLY TWICE DAILY Patient taking differently: Apply 1 Application topically 2 (two) times daily as needed (Dermatitis). 05/17/24   Joshua Debby CROME, MD  indapamide  (LOZOL ) 1.25 MG tablet TAKE 1 TABLET BY MOUTH DAILY 05/17/24   Joshua Debby CROME, MD  losartan  (COZAAR ) 50 MG tablet Take 1 tablet (50 mg total) by mouth daily. 05/22/24   Joshua Debby CROME,  MD  morphine  (MS CONTIN ) 15 MG 12 hr tablet Take 1 tablet (15 mg total) by mouth every 12 (twelve) hours. 07/03/24   Joshua Debby CROME, MD  Multiple Vitamin (MULTIVITAMIN WITH MINERALS) TABS tablet Take 1 tablet by mouth daily with breakfast.    [provider]  nystatin  cream (MYCOSTATIN ) Apply 1 Application topically 2 (two) times daily. Patient taking differently: Apply 1 Application topically as needed for dry skin. 04/24/24   [place], PA-C  pantoprazole  (PROTONIX ) 40 MG tablet Take 1 tablet (40 mg total) by mouth daily. 05/16/24   [place], PA-C  potassium chloride  (KLOR-CON ) 10 MEQ tablet TAKE 1 TABLET BY MOUTH 3 TIMES  DAILY 05/17/24   Joshua Debby CROME, MD  predniSONE  (DELTASONE ) 10 MG tablet 3 tabs by mouth per day for 3 days,2tabs per day for 3 days,1tab per day for 3 days 06/30/24   Norleen Lynwood ORN, MD  thiamine  (VITAMIN B1) 100 MG tablet Take 1 tablet (100 mg total) by mouth daily. 07/09/24   Joshua Debby CROME, MD    Allergies: Betadine  [povidone iodine ], Latex, and Tape    Review of Systems  Respiratory:  Positive for shortness of breath.     Updated Vital Signs BP 124/74 (BP Location: Right Arm)   Pulse 66   Temp 97.9 F (36.6 C) (Oral)   Resp 18   SpO2 99%   Physical  Exam Vitals and nursing note reviewed.  Constitutional:      General: She is not in acute distress.    Appearance: She is well-developed. She is not diaphoretic.  HENT:     Head: Normocephalic and atraumatic.  Eyes:     Pupils: Pupils are equal, round, and reactive to light.  Cardiovascular:     Rate and Rhythm: Normal rate and regular rhythm.     Heart sounds: No murmur heard.    No friction rub. No gallop.  Pulmonary:     Effort: Pulmonary effort is normal.     Breath sounds: No wheezing or rales.  Abdominal:     General: There is no distension.     Palpations: Abdomen is soft.     Tenderness: There is no abdominal tenderness.     Comments: Mild pain about the right side of the abdomen  but seems contiguous with the right groin hematoma  Musculoskeletal:        General: No tenderness.     Cervical back: Normal range of motion and neck supple.  Skin:    General: Skin is warm and dry.  Neurological:     Mental Status: She is alert and oriented to person, place, and time.  Psychiatric:        Behavior: Behavior normal.     (all labs ordered are listed, but only abnormal results are displayed) Labs Reviewed  BASIC METABOLIC PANEL WITH GFR - Abnormal; Notable for the following components:      Result Value   Potassium 3.1 (*)    Creatinine, Ser 1.02 (*)    GFR, Estimated 59 (*)    All other components within normal limits  CBC  CBC  TROPONIN T, HIGH SENSITIVITY  TROPONIN T, HIGH SENSITIVITY    EKG: EKG Interpretation Date/Time:  Thursday July 27 2024 13:31:15 EDT Ventricular Rate:  87 PR Interval:  161 QRS Duration:  81 QT Interval:  382 QTC Calculation: 460 R Axis:   19  Text Interpretation: Sinus rhythm Probable left atrial enlargement Baseline wander in lead(s) II III aVF No significant change since last tracing \ Confirmed by Emil Share [phone]) on 07/27/2024 3:11:13 PM  Radiology: CT Angio Chest PE W and/or Wo Contrast Result Date: 07/27/2024 CLINICAL DATA:  Pulmonary embolism (PE) suspected, high prob. Short of breath EXAM: CT ANGIOGRAPHY CHEST WITH CONTRAST TECHNIQUE: Multidetector CT imaging of the chest was performed using the standard protocol during bolus administration of intravenous contrast. Multiplanar CT image reconstructions and MIPs were obtained to evaluate the vascular anatomy. RADIATION DOSE REDUCTION: This exam was performed according to the departmental dose-optimization program which includes automated exposure control, adjustment of the mA and/or kV according to patient size and/or use of iterative reconstruction technique. CONTRAST:  75mL OMNIPAQUE  IOHEXOL  350 MG/ML SOLN COMPARISON:  CT angio chest 07/27/2024 FINDINGS: Cardiovascular:  Satisfactory opacification of the pulmonary arteries to the segmental level. Markedly improved persistent trace left upper lobe segmental pulmonary embolus (7:63). Persistent right lower lobe segmental and subsegmental pulmonary embolus (11:170). No new pulmonary embolus. Normal heart size. No significant pericardial effusion. The thoracic aorta is normal in caliber. No atherosclerotic plaque of the thoracic aorta. No coronary artery calcifications. Mediastinum/Nodes: No enlarged mediastinal, hilar, or axillary lymph nodes. Trachea and esophagus demonstrate no significant findings. Stable enlarged thyroid  gland with likely underlying 3.8 cm right thyroid  gland nodule. Lungs/Pleura: No focal consolidation. No pulmonary nodule. No pulmonary mass. No pleural effusion. No pneumothorax. Upper Abdomen: Partially visualized fluid dense lesion  of the right kidney likely represents a simple renal cyst. Simple renal cysts, in the absence of clinically indicated signs/symptoms, require no independent follow-up. Musculoskeletal: No chest wall abnormality. No suspicious lytic or blastic osseous lesions. No acute displaced fracture. Review of the MIP images confirms the above findings. IMPRESSION: 1. Markedly improved persistent trace nonocclusive left upper lobe segmental pulmonary embolus. Persistent nonocclusive right lower lobe segmental and subsegmental pulmonary embolus. No new pulmonary embolus. No associated right heart strain or pulmonary infarction. 2. Stable enlarged thyroid  gland with likely underlying 3.8 cm right thyroid  gland nodule. If not previously evaluated, an outpatient nonemergent thyroid  ultrasound is recommended for further evaluation. Electronically Signed   By: Morgane  Naveau M.D.   On: 07/27/2024 17:13   DG Chest 2 View Result Date: 07/27/2024 CLINICAL DATA:  Shortness of breath EXAM: CHEST - 2 VIEW COMPARISON:  Chest x-ray performed October 20, 2022 FINDINGS: The heart size and mediastinal  contours are within normal limits. Both lungs are clear. The visualized skeletal structures are unremarkable. IMPRESSION: No active cardiopulmonary disease. Electronically Signed   By: Maude Naegeli M.D.   On: 07/27/2024 14:57     Procedures   Medications Ordered in the ED  sodium chloride  0.9 % bolus 1,000 mL (0 mLs Intravenous Stopped 07/27/24 1809)  iohexol  (OMNIPAQUE ) 350 MG/ML injection 75 mL (75 mLs Intravenous Contrast Given 07/27/24 1641)                                    Medical Decision Making Amount and/or Complexity of Data Reviewed Labs: ordered. Radiology: ordered.  Risk Prescription drug management.   68 yo F with a chief complaints of shortness of breath.  Going on for about 72 hours.  Has a history of PE and is off anticoagulation due to recurrent GI bleeds.  Will obtain CT imaging of the chest.  Chest x-ray independently interpreted by me without focal infiltrate or pneumothorax.  No anemia, no significant electrolyte abnormalities.  The patient did bring up a good point her hemoglobin last check within a couple weeks was 10 she was concerned that perhaps the blood check today might not be hers.  Will recheck a CBC here.  CBC unchanged.  CT angiogram of the chest is negative for PE.  No occult pneumonia.  Discussed results with patient.  Will discharge home.  PCP follow-up.  7:22 PM:  I have discussed the diagnosis/risks/treatment options with the patient.  Evaluation and diagnostic testing in the emergency department does not suggest an emergent condition requiring admission or immediate intervention beyond what has been performed at this time.  They will follow up with PCP. We also discussed returning to the ED immediately if new or worsening sx occur. We discussed the sx which are most concerning (e.g., sudden worsening pain, fever, inability to tolerate by mouth, sob, hemoptysis, syncope) that necessitate immediate return. Medications administered to the patient  during their visit and any new prescriptions provided to the patient are listed below.  Medications given during this visit Medications  sodium chloride  0.9 % bolus 1,000 mL (0 mLs Intravenous Stopped 07/27/24 1809)  iohexol  (OMNIPAQUE ) 350 MG/ML injection 75 mL (75 mLs Intravenous Contrast Given 07/27/24 1641)     The patient appears reasonably screen and/or stabilized for discharge and I doubt any other medical condition or other [facility] requiring further screening, evaluation, or treatment in the ED at this time prior to discharge.  Final diagnoses:  SOB (shortness of breath)    ED Discharge Orders     None          Emil Share, DO 07/27/24 1922

## 2024-07-27 NOTE — Discharge Instructions (Signed)
 Your test here are reassuring.  As we discussed that does not mean that there is nothing wrong.  Please follow-up with your family doctor in the office.  Please return for worsening symptoms if you cough up blood or feel you are going to pass out.

## 2024-07-27 NOTE — Telephone Encounter (Signed)
  FYI Only or Action Required?: FYI only for provider.  Patient was last seen in primary care on 07/03/2024 by Joshua Debby CROME, MD.  Called Nurse Triage reporting Tachycardia/chest heaviness and SOBdizziness  Pt with h/o PE and currently not on blood thinner due to GI bleed.  Symptoms began 3 days ago .  Interventions attempted: Nothing.  Symptoms are: gradually worsening.  Triage Disposition: Go to ED Now (Notify PCP)  Patient/caregiver understands and will follow disposition?: yes        Copied from CRM [id number]. Topic: Clinical - Red Word Triage >> Jul 27, 2024 11:57 AM Mercedes MATSU wrote: Red Word that prompted transfer to Nurse Triage: Patient states that she is having a fast heart beat, she took her pulse and it was 101bpm. Patient also c/o slight dizziness with shortness of breath as well. Reason for Disposition  Difficulty breathing  Answer Assessment - Initial Assessment Questions 1. DESCRIPTION: Please describe your heart rate or heartbeat that you are having (e.g., fast/slow, regular/irregular, skipped or extra beats, palpitations)     Fast HR  2. ONSET: When did it start? (e.g., minutes, hours, days)      3 days ago  3. DURATION: How long does it last (e.g., seconds, minutes, hours)     Constant  4. PATTERN Does it come and go, or has it been constant since it started?  Does it get worse with exertion?   Are you feeling it now?     SOB constant at rest  periodic dizziness, fatigue, feels heaviness, cold clammy    6. HEART RATE: Can you tell me your heart rate? How many beats in 15 seconds?  Note: Not all patients can do this.       101 7. RECURRENT SYMPTOM: Have you ever had this before? If Yes, ask: When was the last time? and What happened that time?      When she had a PE 8. CAUSE: What do you think is causing the palpitations?     Mentioned PE cannot take Eliquis   10. OTHER SYMPTOMS: Do you have any other symptoms? (e.g.,  dizziness, chest pain, sweating, difficulty breathing)       Dizziness, SOB  Protocols used: Heart Rate and Heartbeat Questions-A-AH

## 2024-07-27 NOTE — ED Triage Notes (Signed)
 Pt presents to ED from home C/O continued SOB, palpitations. Reports known PE, but was taken off Eliquis  d/t GI bleed. Now has IVC filter in place.

## 2024-07-28 NOTE — Progress Notes (Signed)
 Ben Travia Onstad D.[facility] [address] [place] [address] Phone: [phone]   Assessment and Plan:     1. Pain in both knees, unspecified chronicity (Primary) -Chronic with exacerbation, subsequent visit - Overall improvement after bilateral intra-articular knee CSI performed on 05/15/2024.  Symptoms have began to mildly return and patient is leaving the country this weekend and will be gone for 2-3 months - We discussed that ideally we would wait a minimum of 3 months in between injections.  Patient elected to defer injection until future visit - Use Tylenol  650 mg tablets 2-3 times a day for day-to-day pain relief - Use meloxicam  15 mg daily as needed for pain.  Recommend limiting chronic NSAIDs to 1-2 doses per week to prevent long-term side effects.  Do not recommend regular NSAID use due to history of UC    Pertinent previous records reviewed include none   Follow Up: As needed when returning from [place] trip.  Could discuss repeat CSI versus alternative injections versus physical therapy   Subjective:   I, Moenique Parris, am serving as a Neurosurgeon for Doctor Morene Mace   Chief Complaint: bilateral knee pain    HPI:    08/22/21 Patient is a 68 year old female presenting with bilateral knee pain worse when walking. Patient has had pain in both knees for several years but in the last year it has been worse. Patient locates pain to front on knee cap and the back of her knee feels like a constant aching worse when laying. Patient is a walker and once she gets going the pain will eventually subside. Stopped the meloxicam  since she found out her GFR has dropped.    Radiates: yes down to the shins  Swelling: sometimes Mechanical symptoms: yes  Numbness/tingling: tingling pain that starts lateral R leg above the knee down the leg  Weakness: rarely, but in the right leg  Aggravates: sitting to standing, going up and down steps   Treatments tried: brace, meloxicam , tylenol ,    04/21/2022 Patient states that her knee are hurting real bad wants a CSI    11/11/2022 Patient states is ready for an injection today    03/31/2023 Patient states would like bilateral injection    05/15/2024 Patient states she is ready for injections    07/31/2024 Patient states would like bilat injections. Is leaving for [place] on Saturday   Relevant Historical Information: Hypertension, ulcerative colitis with history of resection  Additional pertinent review of systems negative.   Current Outpatient Medications:    ALPRAZolam  (XANAX ) 0.25 MG tablet, Take 1 tablet (0.25 mg total) by mouth 2 (two) times daily as needed for anxiety., Disp: 180 tablet, Rfl: 0   [Paused] apixaban  (ELIQUIS ) 5 MG TABS tablet, Take 1 tablet (5 mg total) by mouth 2 (two) times daily., Disp: 360 tablet, Rfl: 0   augmented betamethasone dipropionate (DIPROLENE-AF) 0.05 % cream, Apply 1 Application topically 2 (two) times daily as needed (Dermatitis)., Disp: , Rfl:    cholecalciferol (VITAMIN D3) 25 MCG (1000 UNIT) tablet, Take 1,000 Units by mouth daily., Disp: , Rfl:    hydrocortisone  2.5 % cream, APPLY TOPICALLY TWICE DAILY (Patient taking differently: Apply 1 Application topically 2 (two) times daily as needed (Dermatitis).), Disp: 120 g, Rfl: 0   indapamide  (LOZOL ) 1.25 MG tablet, TAKE 1 TABLET BY MOUTH DAILY, Disp: 100 tablet, Rfl: 2   losartan  (COZAAR ) 50 MG tablet, TAKE 1 TABLET BY MOUTH DAILY, Disp: 100 tablet, Rfl:  2   morphine  (MS CONTIN ) 15 MG 12 hr tablet, Take 1 tablet (15 mg total) by mouth every 12 (twelve) hours., Disp: 60 tablet, Rfl: 0   Multiple Vitamin (MULTIVITAMIN WITH MINERALS) TABS tablet, Take 1 tablet by mouth daily with breakfast., Disp: , Rfl:    nystatin  cream (MYCOSTATIN ), Apply 1 Application topically 2 (two) times daily. (Patient taking differently: Apply 1 Application topically as needed for dry skin.), Disp: 60 g, Rfl: 1    pantoprazole  (PROTONIX ) 40 MG tablet, Take 1 tablet (40 mg total) by mouth daily., Disp: 90 tablet, Rfl: 3   potassium chloride  (KLOR-CON ) 10 MEQ tablet, TAKE 1 TABLET BY MOUTH 3 TIMES  DAILY, Disp: 270 tablet, Rfl: 0   predniSONE  (DELTASONE ) 10 MG tablet, 3 tabs by mouth per day for 3 days,2tabs per day for 3 days,1tab per day for 3 days, Disp: 18 tablet, Rfl: 0   thiamine  (VITAMIN B1) 100 MG tablet, Take 1 tablet (100 mg total) by mouth daily., Disp: 90 tablet, Rfl: 0   Objective:     Vitals:   07/31/24 1419  Pulse: 93  SpO2: 99%  Weight: 172 lb (78 kg)  Height: 5' 3 (1.6 m)      Body mass index is 30.47 kg/m.    Physical Exam:    General:  awake, alert oriented, no acute distress nontoxic Skin: no suspicious lesions or rashes Neuro:sensation intact, no deficits, strength 5/5 with no deficits, no atrophy, normal muscle tone Psych: No signs of anxiety, depression or other mood disorder   Bilateral knee: No swelling No deformity Neg fluid wave, joint milking Bilateral crepitus, worse on right compared to left Bilateral ROM Flex 100, Ext 10 TTP medial joint line, lateral joint line NTTP over the quad tendon, medial fem condyle, lat fem condyle, patella, plica, patella tendon, tibial tuberostiy, fibular head, posterior fossa, pes anserine bursa, gerdy's tubercle Neg anterior and posterior drawer Neg lachman Neg sag sign Negative varus stress Negative valgus stress Negative McMurray Negative Thessaly   Gait normal     Electronically signed by:  Odis Mace D[facility] 2:35 PM 07/31/24

## 2024-07-31 ENCOUNTER — Ambulatory Visit (INDEPENDENT_AMBULATORY_CARE_PROVIDER_SITE_OTHER)

## 2024-07-31 ENCOUNTER — Ambulatory Visit (INDEPENDENT_AMBULATORY_CARE_PROVIDER_SITE_OTHER): Admitting: Sports Medicine

## 2024-07-31 VITALS — HR 93 | Ht 63.0 in | Wt 172.0 lb

## 2024-07-31 DIAGNOSIS — M25561 Pain in right knee: Secondary | ICD-10-CM

## 2024-07-31 DIAGNOSIS — M1712 Unilateral primary osteoarthritis, left knee: Secondary | ICD-10-CM | POA: Diagnosis not present

## 2024-07-31 DIAGNOSIS — M25562 Pain in left knee: Secondary | ICD-10-CM | POA: Diagnosis not present

## 2024-07-31 DIAGNOSIS — M25461 Effusion, right knee: Secondary | ICD-10-CM | POA: Diagnosis not present

## 2024-07-31 DIAGNOSIS — M25462 Effusion, left knee: Secondary | ICD-10-CM | POA: Diagnosis not present

## 2024-07-31 DIAGNOSIS — M1711 Unilateral primary osteoarthritis, right knee: Secondary | ICD-10-CM | POA: Diagnosis not present

## 2024-07-31 NOTE — Patient Instructions (Signed)
 Thank you for coming in today  - Use Tylenol  650 mg tablets 2-3 times a day for day-to-day pain relief - Use meloxicam  15 mg daily as needed for pain.  Recommend limiting chronic NSAIDs to 1-2 doses per week to prevent long-term side effects.  Follow-up as needed and could further discuss additional injections if necessary.

## 2024-08-02 ENCOUNTER — Ambulatory Visit: Payer: Self-pay | Admitting: Internal Medicine

## 2024-08-02 ENCOUNTER — Encounter: Payer: Self-pay | Admitting: Internal Medicine

## 2024-08-02 ENCOUNTER — Other Ambulatory Visit: Payer: Self-pay | Admitting: Internal Medicine

## 2024-08-02 ENCOUNTER — Ambulatory Visit: Payer: Self-pay | Admitting: Gastroenterology

## 2024-08-02 ENCOUNTER — Encounter: Payer: Self-pay | Admitting: Hematology and Oncology

## 2024-08-02 ENCOUNTER — Ambulatory Visit: Admitting: Internal Medicine

## 2024-08-02 ENCOUNTER — Ambulatory Visit

## 2024-08-02 VITALS — BP 110/80 | HR 78 | Temp 97.9°F | Ht 63.0 in | Wt 172.5 lb

## 2024-08-02 DIAGNOSIS — T502X5A Adverse effect of carbonic-anhydrase inhibitors, benzothiadiazides and other diuretics, initial encounter: Secondary | ICD-10-CM | POA: Diagnosis not present

## 2024-08-02 DIAGNOSIS — I724 Aneurysm of artery of lower extremity: Secondary | ICD-10-CM

## 2024-08-02 DIAGNOSIS — M25551 Pain in right hip: Secondary | ICD-10-CM

## 2024-08-02 DIAGNOSIS — E876 Hypokalemia: Secondary | ICD-10-CM | POA: Diagnosis not present

## 2024-08-02 DIAGNOSIS — R42 Dizziness and giddiness: Secondary | ICD-10-CM

## 2024-08-02 DIAGNOSIS — M25552 Pain in left hip: Secondary | ICD-10-CM | POA: Diagnosis not present

## 2024-08-02 DIAGNOSIS — F419 Anxiety disorder, unspecified: Secondary | ICD-10-CM

## 2024-08-02 DIAGNOSIS — M1612 Unilateral primary osteoarthritis, left hip: Secondary | ICD-10-CM | POA: Diagnosis not present

## 2024-08-02 DIAGNOSIS — D649 Anemia, unspecified: Secondary | ICD-10-CM

## 2024-08-02 LAB — CBC WITH DIFFERENTIAL/PLATELET
Basophils Absolute: 0 K/uL (ref 0.0–0.1)
Basophils Relative: 0.6 % (ref 0.0–3.0)
Eosinophils Absolute: 0.2 K/uL (ref 0.0–0.7)
Eosinophils Relative: 2.8 % (ref 0.0–5.0)
HCT: 36.8 % (ref 36.0–46.0)
Hemoglobin: 12.3 g/dL (ref 12.0–15.0)
Lymphocytes Relative: 28.6 % (ref 12.0–46.0)
Lymphs Abs: 2 K/uL (ref 0.7–4.0)
MCHC: 33.5 g/dL (ref 30.0–36.0)
MCV: 84.2 fl (ref 78.0–100.0)
Monocytes Absolute: 0.5 K/uL (ref 0.1–1.0)
Monocytes Relative: 7.3 % (ref 3.0–12.0)
Neutro Abs: 4.2 K/uL (ref 1.4–7.7)
Neutrophils Relative %: 60.7 % (ref 43.0–77.0)
Platelets: 400 K/uL (ref 150.0–400.0)
RBC: 4.38 Mil/uL (ref 3.87–5.11)
RDW: 18 % — ABNORMAL HIGH (ref 11.5–15.5)
WBC: 6.9 K/uL (ref 4.0–10.5)

## 2024-08-02 LAB — BASIC METABOLIC PANEL WITH GFR
BUN: 16 mg/dL (ref 6–23)
CO2: 30 meq/L (ref 19–32)
Calcium: 10 mg/dL (ref 8.4–10.5)
Chloride: 97 meq/L (ref 96–112)
Creatinine, Ser: 1.15 mg/dL (ref 0.40–1.20)
GFR: 48.64 mL/min — ABNORMAL LOW (ref >60.00)
Glucose, Bld: 106 mg/dL — ABNORMAL HIGH (ref 70–99)
Potassium: 3.2 meq/L — ABNORMAL LOW (ref 3.5–5.1)
Sodium: 136 meq/L (ref 135–145)

## 2024-08-02 LAB — HEMOGLOBIN: Hemoglobin: 12.3 g/dL (ref 12.0–15.0)

## 2024-08-02 MED ORDER — POTASSIUM CHLORIDE ER 10 MEQ PO TBCR: 10.0000 meq | EXTENDED_RELEASE_TABLET | Freq: Every day | ORAL | 0 refills | Status: AC

## 2024-08-02 NOTE — Patient Instructions (Signed)
Please continue all other medications as before, and refills have been done if requested.  Please have the pharmacy call with any other refills you may need.  Please continue your efforts at being more active, low cholesterol diet, and weight control  Please keep your appointments with your specialists as you may have planned  Please go to the XRAY Department in the first floor for the x-ray testing  Please go to the LAB at the blood drawing area for the tests to be done  You will be contacted by phone if any changes need to be made immediately.  Otherwise, you will receive a letter about your results with an explanation, but please check with MyChart first.

## 2024-08-02 NOTE — Progress Notes (Unsigned)
 Patient ID: [name], female   DOB: [birth year], 68 y.o.   MRN: [id number]        Chief Complaint: follow up recent right lateral hip hematoma, right hip DJD, dizziness, b12 deficiency, low K and anxiety       HPI:  KAYTE BORCHARD is a 68 y.o. female here overall doing ok and improved over last visit in that the right lateral hip hematoma much less tender and swollen.l  Does still have right groin pain on ambulation intermittent mild, but no giveaways or falls.  Some radiation to the right anterior leg to the knee.  CT abd /pelvis 8/23 mentions right hip DJD.  Ddi have mild low K at recent ED labs, asking for f/u.  Pt denies chest pain, increased sob or doe, wheezing, orthopnea, PND, increased LE swelling, palpitations, syncope and intermittent dizziness lightheaded for unclear reason, not worse to stand up or better with lying down.  No overt other bleeding, last cbc normal around time of hematoma.   Denies worsening depressive symptoms, suicidal ideation, or panic; has ongoing anxiety some worse today given recent events..       Wt Readings from Last 3 Encounters:  08/02/24 172 lb 8 oz (78.2 kg)  07/31/24 172 lb (78 kg)  07/11/24 176 lb (79.8 kg)   BP Readings from Last 3 Encounters:  08/02/24 110/80  07/27/24 124/74  07/11/24 (!) 135/91         Past Medical History:  Diagnosis Date   Allergy 2016   Latex, adhesives   Anemia    Anxiety    Arthritis    Blood transfusion    Bowel obstruction (HCC)    Cataract 2022   Surgery to remove on right eye.  Small left eye cataract   Colitis, ulcerative (HCC)    Colon polyps    DVT (deep venous thrombosis) (HCC)    Hyperlipidemia    Hypertension    Ileostomy present (HCC)    Kidney disease    Numbness and tingling    hands and feet bilat    Pulmonary embolism (HCC)    Shortness of breath dyspnea    talking or walking    Type 2 diabetes mellitus with complication, without long-term current use of insulin  (HCC) 09/05/2015    currently on no medications, (05/26/2016) pt denies diabetes.  States that she had one time high blood sugars d/t prednisone    Past Surgical History:  Procedure Laterality Date   ABDOMINAL HYSTERECTOMY  2000   ABDOMINAL SURGERY     BREAST CYST ASPIRATION     CATARACT EXTRACTION W/PHACO Right 10/20/2017   Procedure: CATARACT EXTRACTION PHACO AND INTRAOCULAR LENS PLACEMENT (IOC);  Surgeon: Myrna Adine Anes, MD;  Location: [facility];  Service: Ophthalmology;  Laterality: Right;  US  00:50.1 AP% 13.2 CDE 6.61 Fluid Pack lot # [id number]   COLON SURGERY     colostomy   cortisol shot     in left shoulder   DILATION AND CURETTAGE OF UTERUS     most likely after miscarriage   ESOPHAGOGASTRODUODENOSCOPY N/A 05/03/2024   Procedure: EGD (ESOPHAGOGASTRODUODENOSCOPY);  Surgeon: Charlanne Groom, MD;  Location: [facility];  Service: Gastroenterology;  Laterality: N/A;   EYE SURGERY  2022   Macular hole right eye   fibrocystic breast disease     q 6 month mammogram   gravida 6 para 2     all SAB   hyadiform mole     ILEO LOOP DIVERSION N/A 09/25/2015  Procedure: ILEO LOOP COLOSTOMY;  Surgeon: Bernarda Ned, MD;  Location: [facility];  Service: General;  Laterality: N/A;   ILEOSCOPY N/A 06/25/2024   Procedure: ILEOSCOPY, THROUGH STOMA;  Surgeon: Stacia Glendia BRAVO, MD;  Location: [facility];  Service: Gastroenterology;  Laterality: N/A;   ILEOSTOMY CLOSURE N/A 10/08/2015   Procedure: ILEOSTOMY REVISION;  Surgeon: Bernarda Ned, MD;  Location: [facility];  Service: General;  Laterality: N/A;   ILEOSTOMY CLOSURE N/A 08/20/2016   Procedure: OPEN RELOCATION OF ILEOSTOMY;  Surgeon: Bernarda Ned, MD;  Location: [facility];  Service: General;  Laterality: N/A;   IR ANGIOGRAM SELECTIVE EACH ADDITIONAL VESSEL  06/23/2024   IR ANGIOGRAM SELECTIVE EACH ADDITIONAL VESSEL  06/23/2024   IR ANGIOGRAM VISCERAL SELECTIVE  06/23/2024   IR GENERIC HISTORICAL  09/01/2016   IR US  GUIDE VASC ACCESS RIGHT 09/01/2016 Franky Rusk, PA-C [facility]   IR GENERIC HISTORICAL  09/01/2016   IR FLUORO GUIDE CV LINE RIGHT 09/01/2016 Franky Rusk, PA-C [facility]   IR IVC FILTER PLMT / S&I PORTER GUID/MOD SED  06/28/2024   IR TUNNELED CENTRAL VENOUS CATH PLC W IMG  06/28/2024   IR US  GUIDE VASC ACCESS RIGHT  06/23/2024   KNEE ARTHROSCOPY W/ MENISCAL REPAIR     right knee (Daldorf)   LAPAROSCOPIC SMALL BOWEL RESECTION N/A 09/25/2015   Procedure: LAPAROSCOPIC BOWEL RESECTION TIMES TWO;  Surgeon: Bernarda Ned, MD;  Location: [facility];  Service: General;  Laterality: N/A;   ROBOTIC ASSISTED LAPAROSCOPIC LYSIS OF ADHESION N/A 09/25/2015   Procedure: XI ROBOTIC ASSISTED LAPAROSCOPIC LYSIS OF ADHESION;  Surgeon: Bernarda Ned, MD;  Location: [facility];  Service: General;  Laterality: N/A;  90 minutes   Small bowel obstruction     SMART PILL PROCEDURE  06/25/2024   Procedure: CAPSULE ENDOSCOPY, PATENCY;  Surgeon: Stacia Glendia BRAVO, MD;  Location: [facility];  Service: Gastroenterology;;    reports that she has never smoked. She has never been exposed to tobacco smoke. She has never used smokeless tobacco. She reports current alcohol use of about 2.0 standard drinks of alcohol per week. She reports that she does not use drugs. family history includes Arthritis in her mother; Breast cancer in her maternal aunt; Breast cancer (age of onset: 78) in her sister; Breast cancer (age of onset: 15) in her mother; Cancer in her mother, sister, and sister; Colon cancer in her paternal aunt; Diabetes in her brother, father, maternal grandmother, mother, sister, and sister; Diverticulosis in her brother and sister; HIV in her father; Heart disease in her father; Hyperlipidemia in her daughter; Irritable bowel syndrome in her sister; Kidney disease in her mother; Throat cancer in her sister. Allergies  Allergen Reactions   Betadine  [Povidone Iodine ] Rash and Other (See Comments)    The skin is very sensitive   Latex Rash and Other (See  Comments)    Redness of skin   Tape Rash and Other (See Comments)    Redness of skin OK with hypofix tape and paper tape! NO transpore tape adhesive tape!   Current Outpatient Medications on File Prior to Visit  Medication Sig Dispense Refill   ALPRAZolam  (XANAX ) 0.25 MG tablet Take 1 tablet (0.25 mg total) by mouth 2 (two) times daily as needed for anxiety. 180 tablet 0   [Paused] apixaban  (ELIQUIS ) 5 MG TABS tablet Take 1 tablet (5 mg total) by mouth 2 (two) times daily. 360 tablet 0   augmented betamethasone dipropionate (DIPROLENE-AF) 0.05 % cream Apply 1 Application topically 2 (two)  times daily as needed (Dermatitis).     cholecalciferol (VITAMIN D3) 25 MCG (1000 UNIT) tablet Take 1,000 Units by mouth daily.     hydrocortisone  2.5 % cream APPLY TOPICALLY TWICE DAILY (Patient taking differently: Apply 1 Application topically 2 (two) times daily as needed (Dermatitis).) 120 g 0   indapamide  (LOZOL ) 1.25 MG tablet TAKE 1 TABLET BY MOUTH DAILY 100 tablet 2   losartan  (COZAAR ) 50 MG tablet TAKE 1 TABLET BY MOUTH DAILY 100 tablet 2   morphine  (MS CONTIN ) 15 MG 12 hr tablet Take 1 tablet (15 mg total) by mouth every 12 (twelve) hours. 60 tablet 0   Multiple Vitamin (MULTIVITAMIN WITH MINERALS) TABS tablet Take 1 tablet by mouth daily with breakfast.     nystatin  cream (MYCOSTATIN ) Apply 1 Application topically 2 (two) times daily. (Patient taking differently: Apply 1 Application topically as needed for dry skin.) 60 g 1   pantoprazole  (PROTONIX ) 40 MG tablet Take 1 tablet (40 mg total) by mouth daily. 90 tablet 3   potassium chloride  (KLOR-CON ) 10 MEQ tablet TAKE 1 TABLET BY MOUTH 3 TIMES  DAILY 270 tablet 0   predniSONE  (DELTASONE ) 10 MG tablet 3 tabs by mouth per day for 3 days,2tabs per day for 3 days,1tab per day for 3 days 18 tablet 0   thiamine  (VITAMIN B1) 100 MG tablet Take 1 tablet (100 mg total) by mouth daily. 90 tablet 0   No current facility-administered medications on file prior  to visit.        ROS:  All others reviewed and negative.  Objective        PE:  BP 110/80   Pulse 78   Temp 97.9 F (36.6 C) (Temporal)   Ht 5' 3 (1.6 m)   Wt 172 lb 8 oz (78.2 kg)   SpO2 98%   BMI 30.56 kg/m                 Constitutional: Pt appears in NAD               HENT: Head: NCAT.                Right Ear: External ear normal.                 Left Ear: External ear normal.                Eyes: . Pupils are equal, round, and reactive to light. Conjunctivae and EOM are normal               Nose: without d/c or deformity               Neck: Neck supple. Gross normal ROM               Cardiovascular: Normal rate and regular rhythm.                 Pulmonary/Chest: Effort normal and breath sounds without rales or wheezing.                Abd:  Soft, NT, ND, + BS, no organomegaly                Right hip with some pain on forward leg elevation               Neurological: Pt is alert. At baseline orientation, motor grossly intact  Skin: Skin is warm. No rashes, no other new lesions, LE edema - none               Psychiatric: Pt behavior is normal without agitation , mild to mod nervous  Micro: none  Cardiac tracings I have personally interpreted today:  none  Pertinent Radiological findings (summarize): none   Lab Results  Component Value Date   WBC 6.9 08/02/2024   HGB 12.3 08/02/2024   HGB 12.3 08/02/2024   HCT 36.8 08/02/2024   PLT 400.0 08/02/2024   GLUCOSE 106 (H) 08/02/2024   CHOL 171 03/06/2024   TRIG 68.0 03/06/2024   HDL 110.00 03/06/2024   LDLDIRECT 114.0 04/29/2017   LDLCALC 48 03/06/2024   ALT 14 07/11/2024   AST 14 (L) 07/11/2024   NA 136 08/02/2024   K 3.2 (L) 08/02/2024   CL 97 08/02/2024   CREATININE 1.15 08/02/2024   BUN 16 08/02/2024   CO2 30 08/02/2024   TSH 1.00 03/06/2024   INR 1.0 06/28/2024   HGBA1C 5.6 04/21/2024   MICROALBUR 0.7 03/06/2024   Assessment/Plan:  LACYE MCCARN is a 68 y.o. Black or African  American [2] female with  has a past medical history of Allergy (2016), Anemia, Anxiety, Arthritis, Blood transfusion, Bowel obstruction (HCC), Cataract (2022), Colitis, ulcerative (HCC), Colon polyps, DVT (deep venous thrombosis) (HCC), Hyperlipidemia, Hypertension, Ileostomy present (HCC), Kidney disease, Numbness and tingling, Pulmonary embolism (HCC), Shortness of breath dyspnea, and Type 2 diabetes mellitus with complication, without long-term current use of insulin  (HCC) (09/05/2015).  Right hip pain I suspect flare of underlying DJD, incidental to recent pseudoaneurysm now resolved,   For plain film today, tylenol  prn, and consider sport med referral though declines for now  Pseudoaneurysm of right femoral artery Fortunately now clinically resolved,  to f/u any worsening symptoms or concerns  Dizziness Etiology unclear but does not seem vertiginous, I suspect anxiety related, for cbc with labs  Diuretic-induced hypokalemia Pt not currently taking diuretic, recent K 3.1 mild low, pt for f/u lab today  Anxiety With mild situational flare, for pt reassurance today  Followup: Return if symptoms worsen or fail to improve.  Lynwood Rush, MD 08/04/2024 6:00 AM [facility]

## 2024-08-04 ENCOUNTER — Encounter: Payer: Self-pay | Admitting: Internal Medicine

## 2024-08-04 DIAGNOSIS — F419 Anxiety disorder, unspecified: Secondary | ICD-10-CM | POA: Insufficient documentation

## 2024-08-04 DIAGNOSIS — R42 Dizziness and giddiness: Secondary | ICD-10-CM | POA: Insufficient documentation

## 2024-08-04 NOTE — Assessment & Plan Note (Signed)
 With mild situational flare, for pt reassurance today

## 2024-08-04 NOTE — Assessment & Plan Note (Addendum)
 I suspect flare of underlying DJD, incidental to recent pseudoaneurysm now resolved,   For plain film today, tylenol  prn, and consider sport med referral though declines for now

## 2024-08-04 NOTE — Assessment & Plan Note (Addendum)
 Etiology unclear but does not seem vertiginous, I suspect anxiety related, for cbc with labs

## 2024-08-04 NOTE — Assessment & Plan Note (Signed)
 Pt not currently taking diuretic, recent K 3.1 mild low, pt for f/u lab today

## 2024-08-04 NOTE — Assessment & Plan Note (Signed)
 Fortunately now clinically resolved,  to f/u any worsening symptoms or concerns

## 2024-08-05 ENCOUNTER — Telehealth: Payer: Self-pay | Admitting: Hematology and Oncology

## 2024-08-05 NOTE — Telephone Encounter (Signed)
 Scheduled appointment per per 9/23 inbasket message from Dr.Dorsey. While talking with the patient, she states she is out of town traveling with her daughter, and grandchild. Patient states she will not be back until December. October appointments were canceled per patients request. Patient states she will continue communicating with our office via mychart. Patient is currently scheduled for labs and an office visit on 12/12 starting at 0930. Patient is aware of those appointments.

## 2024-08-06 ENCOUNTER — Other Ambulatory Visit: Payer: Self-pay | Admitting: Internal Medicine

## 2024-08-06 DIAGNOSIS — M25551 Pain in right hip: Secondary | ICD-10-CM

## 2024-08-07 ENCOUNTER — Ambulatory Visit: Payer: Self-pay | Admitting: Sports Medicine

## 2024-08-07 LAB — VITAMIN B1: Vitamin B1 (Thiamine): 85 nmol/L — ABNORMAL HIGH (ref 8–30)

## 2024-08-15 ENCOUNTER — Inpatient Hospital Stay: Attending: Hematology and Oncology

## 2024-08-15 ENCOUNTER — Inpatient Hospital Stay: Admitting: Physician Assistant

## 2024-08-16 ENCOUNTER — Inpatient Hospital Stay: Admitting: Internal Medicine

## 2024-08-22 ENCOUNTER — Inpatient Hospital Stay: Admitting: Internal Medicine

## 2024-08-22 ENCOUNTER — Ambulatory Visit: Admitting: Hematology and Oncology

## 2024-08-22 ENCOUNTER — Other Ambulatory Visit

## 2024-08-22 ENCOUNTER — Encounter (HOSPITAL_COMMUNITY): Payer: Self-pay | Admitting: Gastroenterology

## 2024-08-24 ENCOUNTER — Inpatient Hospital Stay: Admitting: Internal Medicine

## 2024-08-28 ENCOUNTER — Ambulatory Visit: Admitting: Nurse Practitioner

## 2024-08-28 ENCOUNTER — Inpatient Hospital Stay: Admitting: Internal Medicine

## 2024-09-04 ENCOUNTER — Telehealth (INDEPENDENT_AMBULATORY_CARE_PROVIDER_SITE_OTHER): Payer: Self-pay

## 2024-09-04 NOTE — Telephone Encounter (Signed)
 Left vm to r/s 10/16/24 appointment

## 2024-10-16 ENCOUNTER — Institutional Professional Consult (permissible substitution) (INDEPENDENT_AMBULATORY_CARE_PROVIDER_SITE_OTHER): Admitting: Otolaryngology

## 2024-10-17 NOTE — Progress Notes (Unsigned)
 Ben Jackson D.[facility] [address] [place] [address] Phone: [phone]   Assessment and Plan:     ***    Pertinent previous records reviewed include ***   Follow Up: ***     Subjective:   I, Latasha Wood, am serving as a neurosurgeon for Doctor Morene Mace   Chief Complaint: bilateral knee pain    HPI:    08/22/21 Patient is a 68 year old female presenting with bilateral knee pain worse when walking. Patient has had pain in both knees for several years but in the last year it has been worse. Patient locates pain to front on knee cap and the back of her knee feels like a constant aching worse when laying. Patient is a walker and once she gets going the pain will eventually subside. Stopped the meloxicam  since she found out her GFR has dropped.    Radiates: yes down to the shins  Swelling: sometimes Mechanical symptoms: yes  Numbness/tingling: tingling pain that starts lateral R leg above the knee down the leg  Weakness: rarely, but in the right leg  Aggravates: sitting to standing, going up and down steps  Treatments tried: brace, meloxicam , tylenol ,    04/21/2022 Patient states that her knee are hurting real bad wants a CSI    11/11/2022 Patient states is ready for an injection today    03/31/2023 Patient states would like bilateral injection    05/15/2024 Patient states she is ready for injections     07/31/2024 Patient states would like bilat injections. Is leaving for [place] on Saturday   10/18/2024 Patient states   Relevant Historical Information: Hypertension, ulcerative colitis with history of resection  Additional pertinent review of systems negative.   Current Outpatient Medications:    ALPRAZolam  (XANAX ) 0.25 MG tablet, Take 1 tablet (0.25 mg total) by mouth 2 (two) times daily as needed for anxiety., Disp: 180 tablet, Rfl: 0   [Paused] apixaban  (ELIQUIS ) 5 MG TABS tablet, Take 1 tablet (5 mg total) by  mouth 2 (two) times daily., Disp: 360 tablet, Rfl: 0   augmented betamethasone dipropionate (DIPROLENE-AF) 0.05 % cream, Apply 1 Application topically 2 (two) times daily as needed (Dermatitis)., Disp: , Rfl:    cholecalciferol (VITAMIN D3) 25 MCG (1000 UNIT) tablet, Take 1,000 Units by mouth daily., Disp: , Rfl:    hydrocortisone  2.5 % cream, APPLY TOPICALLY TWICE DAILY (Patient taking differently: Apply 1 Application topically 2 (two) times daily as needed (Dermatitis).), Disp: 120 g, Rfl: 0   indapamide  (LOZOL ) 1.25 MG tablet, TAKE 1 TABLET BY MOUTH DAILY, Disp: 100 tablet, Rfl: 2   losartan  (COZAAR ) 50 MG tablet, TAKE 1 TABLET BY MOUTH DAILY, Disp: 100 tablet, Rfl: 2   morphine  (MS CONTIN ) 15 MG 12 hr tablet, Take 1 tablet (15 mg total) by mouth every 12 (twelve) hours., Disp: 60 tablet, Rfl: 0   Multiple Vitamin (MULTIVITAMIN WITH MINERALS) TABS tablet, Take 1 tablet by mouth daily with breakfast., Disp: , Rfl:    nystatin  cream (MYCOSTATIN ), Apply 1 Application topically 2 (two) times daily. (Patient taking differently: Apply 1 Application topically as needed for dry skin.), Disp: 60 g, Rfl: 1   pantoprazole  (PROTONIX ) 40 MG tablet, Take 1 tablet (40 mg total) by mouth daily., Disp: 90 tablet, Rfl: 3   potassium chloride  (KLOR-CON  10) 10 MEQ tablet, Take 1 tablet (10 mEq total) by mouth daily for 5 days., Disp: 5 tablet, Rfl: 0   potassium chloride  (  KLOR-CON ) 10 MEQ tablet, TAKE 1 TABLET BY MOUTH 3 TIMES  DAILY, Disp: 270 tablet, Rfl: 0   predniSONE  (DELTASONE ) 10 MG tablet, 3 tabs by mouth per day for 3 days,2tabs per day for 3 days,1tab per day for 3 days, Disp: 18 tablet, Rfl: 0   thiamine  (VITAMIN B1) 100 MG tablet, Take 1 tablet (100 mg total) by mouth daily., Disp: 90 tablet, Rfl: 0   Objective:     There were no vitals filed for this visit.    There is no height or weight on file to calculate BMI.    Physical Exam:    ***   Electronically signed by:  Odis Mace D[facility] 9:29 AM 10/17/24

## 2024-10-18 ENCOUNTER — Telehealth: Payer: Self-pay | Admitting: Sports Medicine

## 2024-10-18 ENCOUNTER — Ambulatory Visit: Admitting: Sports Medicine

## 2024-10-18 VITALS — HR 84 | Ht 63.0 in | Wt 173.0 lb

## 2024-10-18 DIAGNOSIS — M25562 Pain in left knee: Secondary | ICD-10-CM

## 2024-10-18 DIAGNOSIS — M25561 Pain in right knee: Secondary | ICD-10-CM

## 2024-10-18 DIAGNOSIS — M170 Bilateral primary osteoarthritis of knee: Secondary | ICD-10-CM | POA: Diagnosis not present

## 2024-10-18 DIAGNOSIS — G8929 Other chronic pain: Secondary | ICD-10-CM

## 2024-10-18 NOTE — Telephone Encounter (Signed)
 Bialt zilretta  2026

## 2024-10-18 NOTE — Patient Instructions (Addendum)
 Prior auth zilretta    As needed follow up ideally after March 10th 2026

## 2024-10-19 ENCOUNTER — Other Ambulatory Visit: Payer: Self-pay | Admitting: Physician Assistant

## 2024-10-19 DIAGNOSIS — D508 Other iron deficiency anemias: Secondary | ICD-10-CM

## 2024-10-19 NOTE — Progress Notes (Unsigned)
 [facility] [phone]   [phone]  PROGRESS NOTE  Patient Care Team: Joshua Debby CROME, MD as PCP - General (Internal Medicine) Obie Princella HERO, MD (Inactive) (Gastroenterology) Gorge Ade, MD (Obstetrics and Gynecology) Rik Channel, MD (Ophthalmology) Debby Hila, MD as Consulting Physician (Colon and Rectal Surgery) Merceda Lela SAUNDERS, [facility] (Pharmacist) Enola Feliciano Hugger, MD as Consulting Physician (Ophthalmology)  Hematological/Oncological History # Bilateral Lower Extremity DVT # Bilateral Pulmonary Emboli 04/20/2024: Presented to cardiologist with progressive shortness of breath and dyspnea over the last few months. Labs showed positive D-dimer and was referred to ED for further evaluation.  04/20/2024-04/23/2024:  04/20/2024: CTA chest: Acute bilateral pulmonary emboli without evidence of right heart strain.  04/21/2024: LE Ultrasound: Acute DVT involving the right peroneal veins and left proximal femoral vein and left profunda vein.  Patient was started on heparin  drip and discharged on Eliquis  therapy Hypercoagulable workup was negative.  05/02/2024: Establish care with [facility]ematology 06/23/2024-06/29/2024: Hospitalized for severe anemia with IVC filter was placed and Eliquis  is on hold.   Interval History:  Latasha Wood 68 y.o. female with medical history significant for DVT/PET who presents for a follow up visit. The patient's last visit was on 07/11/2024. In the interim since the last visit she denies any changes to her health.   On exam today Latasha Wood reports she is doing well with her energy levels.  She has noticed that she has more shortness of breath mainly when she is talking and some with exertion.  She has some chest discomfort but no pain.  She denies nausea, vomiting or bowel habit changes.  She denies easy bruising or overt signs of bleeding.  She denies any lower extremity pain or edema.  She denies  fevers, chills, night sweats, cough, headaches or dizziness.  She has no other complaints.  Rest of the 10 point ROS as below.  Past Medical History:  Diagnosis Date   Allergy 2016   Latex, adhesives   Anemia    Anxiety    Arthritis    Blood transfusion    Bowel obstruction (HCC)    Cataract 2022   Surgery to remove on right eye.  Small left eye cataract   Colitis, ulcerative (HCC)    Colon polyps    DVT (deep venous thrombosis) (HCC)    Hyperlipidemia    Hypertension    Ileostomy present (HCC)    Kidney disease    Numbness and tingling    hands and feet bilat    Pulmonary embolism (HCC)    Shortness of breath dyspnea    talking or walking    Type 2 diabetes mellitus with complication, without long-term current use of insulin  (HCC) 09/05/2015   currently on no medications, (05/26/2016) pt denies diabetes.  States that she had one time high blood sugars d/t prednisone     SURGICAL HISTORY: Past Surgical History:  Procedure Laterality Date   ABDOMINAL HYSTERECTOMY  2000   ABDOMINAL SURGERY     BREAST CYST ASPIRATION     CATARACT EXTRACTION W/PHACO Right 10/20/2017   Procedure: CATARACT EXTRACTION PHACO AND INTRAOCULAR LENS PLACEMENT (IOC);  Surgeon: Myrna Adine Anes, MD;  Location: [facility];  Service: Ophthalmology;  Laterality: Right;  US  00:50.1 AP% 13.2 CDE 6.61 Fluid Pack lot # [id number]   COLON SURGERY     colostomy   cortisol shot     in left shoulder   DILATION AND CURETTAGE OF UTERUS     most likely after miscarriage  ESOPHAGOGASTRODUODENOSCOPY N/A 05/03/2024   Procedure: EGD (ESOPHAGOGASTRODUODENOSCOPY);  Surgeon: Charlanne Groom, MD;  Location: [facility];  Service: Gastroenterology;  Laterality: N/A;   EYE SURGERY  2022   Macular hole right eye   fibrocystic breast disease     q 6 month mammogram   gravida 6 para 2     all SAB   hyadiform mole     ILEO LOOP DIVERSION N/A 09/25/2015   Procedure: ILEO LOOP COLOSTOMY;  Surgeon: Bernarda Ned, MD;   Location: [facility];  Service: General;  Laterality: N/A;   ILEOSCOPY N/A 06/25/2024   Procedure: ILEOSCOPY, THROUGH STOMA;  Surgeon: Stacia Glendia BRAVO, MD;  Location: [facility];  Service: Gastroenterology;  Laterality: N/A;   ILEOSTOMY CLOSURE N/A 10/08/2015   Procedure: ILEOSTOMY REVISION;  Surgeon: Bernarda Ned, MD;  Location: [facility];  Service: General;  Laterality: N/A;   ILEOSTOMY CLOSURE N/A 08/20/2016   Procedure: OPEN RELOCATION OF ILEOSTOMY;  Surgeon: Bernarda Ned, MD;  Location: [facility];  Service: General;  Laterality: N/A;   IR ANGIOGRAM SELECTIVE EACH ADDITIONAL VESSEL  06/23/2024   IR ANGIOGRAM SELECTIVE EACH ADDITIONAL VESSEL  06/23/2024   IR ANGIOGRAM VISCERAL SELECTIVE  06/23/2024   IR GENERIC HISTORICAL  09/01/2016   IR US  GUIDE VASC ACCESS RIGHT 09/01/2016 Franky Rusk, PA-C [facility]   IR GENERIC HISTORICAL  09/01/2016   IR FLUORO GUIDE CV LINE RIGHT 09/01/2016 Franky Rusk, PA-C [facility]   IR IVC FILTER PLMT / S&I PORTER GUID/MOD SED  06/28/2024   IR TUNNELED CENTRAL VENOUS CATH PLC W IMG  06/28/2024   IR US  GUIDE VASC ACCESS RIGHT  06/23/2024   KNEE ARTHROSCOPY W/ MENISCAL REPAIR     right knee ([place]orf)   LAPAROSCOPIC SMALL BOWEL RESECTION N/A 09/25/2015   Procedure: LAPAROSCOPIC BOWEL RESECTION TIMES TWO;  Surgeon: Bernarda Ned, MD;  Location: [facility];  Service: General;  Laterality: N/A;   ROBOTIC ASSISTED LAPAROSCOPIC LYSIS OF ADHESION N/A 09/25/2015   Procedure: XI ROBOTIC ASSISTED LAPAROSCOPIC LYSIS OF ADHESION;  Surgeon: Bernarda Ned, MD;  Location: [facility];  Service: General;  Laterality: N/A;  90 minutes   Small bowel obstruction     SMART PILL PROCEDURE  06/25/2024   Procedure: CAPSULE ENDOSCOPY, PATENCY;  Surgeon: Stacia Glendia BRAVO, MD;  Location: [facility];  Service: Gastroenterology;;    SOCIAL HISTORY: Social History   Socioeconomic History   Marital status: Married    Spouse name: Not on file   Number of children: 2   Years of  education: 18   Highest education level: Bachelor's degree (e.g., BA, AB, BS)  Occupational History   Occupation: Dispensing Optician: [employer]    Comment: [employer]  Tobacco Use   Smoking status: Never    Passive exposure: Never   Smokeless tobacco: Never  Vaping Use   Vaping status: Never Used  Substance and Sexual Activity   Alcohol use: Yes    Alcohol/week: 2.0 standard drinks of alcohol    Types: 2 Standard drinks or equivalent per week    Comment: occ   Drug use: No   Sexual activity: Yes    Partners: Male  Other Topics Concern   Not on file  Social History Narrative   HSG, [employer] - BA, [employer] for med tech.  Married '75. 2 dtrs - '79, '82; 1 grandchild. Marriage is in good health. No abuse issues.    Social Drivers of Health   Tobacco Use: Low Risk (08/04/2024)  Patient History    Smoking Tobacco Use: Never    Smokeless Tobacco Use: Never    Passive Exposure: Never  Financial Resource Strain: Low Risk (04/21/2024)   Overall Financial Resource Strain (CARDIA)    Difficulty of Paying Living Expenses: Not very hard  Food Insecurity: No Food Insecurity (06/24/2024)   Epic    Worried About Programme Researcher, Broadcasting/film/video in the Last Year: Never true    Ran Out of Food in the Last Year: Never true  Transportation Needs: No Transportation Needs (06/24/2024)   Epic    Lack of Transportation (Medical): No    Lack of Transportation (Non-Medical): No  Physical Activity: Insufficiently Active (04/21/2024)   Exercise Vital Sign    Days of Exercise per Week: 2 days    Minutes of Exercise per Session: 30 min  Stress: No Stress Concern Present (04/21/2024)   [employer] of Occupational Health - Occupational Stress Questionnaire    Feeling of Stress: Not at all  Social Connections: Socially Integrated (06/24/2024)   Social Connection and Isolation Panel    Frequency of Communication with Friends and Family: More than three times a week    Frequency of Social Gatherings  with Friends and Family: Once a week    Attends Religious Services: More than 4 times per year    Active Member of [employer] or Organizations: Yes    Attends Engineer, Structural: More than 4 times per year    Marital Status: Married  Recent Concern: Social Connections - Moderately Isolated (04/14/2024)   Social Connection and Isolation Panel    Frequency of Communication with Friends and Family: More than three times a week    Frequency of Social Gatherings with Friends and Family: More than three times a week    Attends Religious Services: Never    Database Administrator or Organizations: No    Attends Banker Meetings: Never    Marital Status: Married  Catering Manager Violence: Not At Risk (06/24/2024)   Epic    Fear of Current or Ex-Partner: No    Emotionally Abused: No    Physically Abused: No    Sexually Abused: No  Depression (PHQ2-9): Low Risk (07/11/2024)   Depression (PHQ2-9)    PHQ-2 Score: 0  Alcohol Screen: Low Risk (04/21/2024)   Alcohol Screen    Last Alcohol Screening Score (AUDIT): 2  Housing: Low Risk (06/24/2024)   Epic    Unable to Pay for Housing in the Last Year: No    Number of Times Moved in the Last Year: 0    Homeless in the Last Year: No  Utilities: Not At Risk (06/24/2024)   Epic    Threatened with loss of utilities: No  Health Literacy: Adequate Health Literacy (04/14/2024)   B1300 Health Literacy    Frequency of need for help with medical instructions: Never    FAMILY HISTORY: Family History  Problem Relation Age of Onset   Diabetes Mother    Breast cancer Mother 50   Arthritis Mother    Cancer Mother    Kidney disease Mother    Diabetes Father    Heart disease Father    HIV Father    Diabetes Sister    Breast cancer Sister 67   Diverticulosis Sister    Throat cancer Sister    Diabetes Sister    Irritable bowel syndrome Sister    Cancer Sister    Diabetes Brother    Diverticulosis Brother  Diabetes Maternal Grandmother     Hyperlipidemia Daughter    Breast cancer Maternal Aunt    Colon cancer Paternal Aunt    Cancer Sister     ALLERGIES:  is allergic to betadine  [povidone iodine ], latex, and tape.  MEDICATIONS:  Current Outpatient Medications  Medication Sig Dispense Refill   ALPRAZolam  (XANAX ) 0.25 MG tablet Take 1 tablet (0.25 mg total) by mouth 2 (two) times daily as needed for anxiety. 180 tablet 0   augmented betamethasone dipropionate (DIPROLENE-AF) 0.05 % cream Apply 1 Application topically 2 (two) times daily as needed (Dermatitis).     cholecalciferol (VITAMIN D3) 25 MCG (1000 UNIT) tablet Take 1,000 Units by mouth daily.     hydrocortisone  2.5 % cream APPLY TOPICALLY TWICE DAILY (Patient taking differently: Apply 1 Application topically 2 (two) times daily as needed (Dermatitis).) 120 g 0   indapamide  (LOZOL ) 1.25 MG tablet TAKE 1 TABLET BY MOUTH DAILY 100 tablet 2   losartan  (COZAAR ) 50 MG tablet TAKE 1 TABLET BY MOUTH DAILY 100 tablet 2   morphine  (MS CONTIN ) 15 MG 12 hr tablet Take 1 tablet (15 mg total) by mouth every 12 (twelve) hours. 60 tablet 0   Multiple Vitamin (MULTIVITAMIN WITH MINERALS) TABS tablet Take 1 tablet by mouth daily with breakfast.     nystatin  cream (MYCOSTATIN ) Apply 1 Application topically 2 (two) times daily. (Patient taking differently: Apply 1 Application topically as needed for dry skin.) 60 g 1   pantoprazole  (PROTONIX ) 40 MG tablet Take 1 tablet (40 mg total) by mouth daily. 90 tablet 3   potassium chloride  (KLOR-CON  10) 10 MEQ tablet Take 1 tablet (10 mEq total) by mouth daily for 5 days. 5 tablet 0   potassium chloride  (KLOR-CON ) 10 MEQ tablet TAKE 1 TABLET BY MOUTH 3 TIMES  DAILY 270 tablet 0   predniSONE  (DELTASONE ) 10 MG tablet 3 tabs by mouth per day for 3 days,2tabs per day for 3 days,1tab per day for 3 days 18 tablet 0   thiamine  (VITAMIN B1) 100 MG tablet Take 1 tablet (100 mg total) by mouth daily. 90 tablet 0   [Paused] apixaban  (ELIQUIS ) 5 MG TABS  tablet Take 1 tablet (5 mg total) by mouth 2 (two) times daily. (Patient not taking: Reported on 10/20/2024) 360 tablet 0   No current facility-administered medications for this visit.    REVIEW OF SYSTEMS:   Constitutional: ( - ) fevers, ( - )  chills , ( - ) night sweats Eyes: ( - ) blurriness of vision, ( - ) double vision, ( - ) watery eyes Ears, nose, mouth, throat, and face: ( - ) mucositis, ( - ) sore throat Respiratory: ( - ) cough, (+ ) dyspnea, ( - ) wheezes Cardiovascular: ( - ) palpitation, ( + ) chest discomfort, ( - ) lower extremity swelling Gastrointestinal:  ( - ) nausea, ( - ) heartburn, ( - ) change in bowel habits Skin: ( - ) abnormal skin rashes Lymphatics: ( - ) new lymphadenopathy, ( - ) easy bruising Neurological: ( - ) numbness, ( - ) tingling, ( - ) new weaknesses Behavioral/Psych: ( - ) mood change, ( - ) new changes  All other systems were reviewed with the patient and are negative.  PHYSICAL EXAMINATION: Vitals:   10/20/24 1020  BP: 139/87  Pulse: 67  Resp: 18  Temp: (!) 97.2 F (36.2 C)  SpO2: 100%     Filed Weights   10/20/24 1020  Weight: 174 lb (  78.9 kg)    GENERAL: Well-appearing elderly African-American female, alert, no distress and comfortable SKIN: skin color, texture, turgor are normal, no rashes or significant lesions EYES: conjunctiva are pink and non-injected, sclera clear LUNGS: clear to auscultation and percussion with normal breathing effort HEART: regular rate & rhythm and no murmurs and no lower extremity edema Musculoskeletal: no cyanosis of digits and no clubbing  PSYCH: alert & oriented x 3, fluent speech NEURO: no focal motor/sensory deficits  LABORATORY DATA:  I have reviewed the data as listed    Latest Ref Rng & Units 10/20/2024    9:57 AM 08/02/2024   10:56 AM 07/27/2024    3:53 PM  CBC  WBC 4.0 - 10.5 K/uL 16.6  6.9  6.6   Hemoglobin 12.0 - 15.0 g/dL 88.2  87.6    87.6  87.9   Hematocrit 36.0 - 46.0 % 33.7   36.8  38.1   Platelets 150 - 400 K/uL 318  400.0  323        Latest Ref Rng & Units 10/20/2024    9:57 AM 08/02/2024   10:56 AM 07/27/2024    1:38 PM  CMP  Glucose 70 - 99 mg/dL 97  893  95   BUN 8 - 23 mg/dL 26  16  20    Creatinine 0.44 - 1.00 mg/dL 8.75  8.84  8.97   Sodium 135 - 145 mmol/L 136  136  139   Potassium 3.5 - 5.1 mmol/L 4.1  3.2  3.1   Chloride 98 - 111 mmol/L 103  97  101   CO2 22 - 32 mmol/L 23  30  24    Calcium  8.9 - 10.3 mg/dL 9.7  89.9  89.9   Total Protein 6.5 - 8.1 g/dL 7.5     Total Bilirubin 0.0 - 1.2 mg/dL 0.4     Alkaline Phos 38 - 126 U/L 80     AST 15 - 41 U/L 29     ALT 0 - 44 U/L 16       RADIOGRAPHIC STUDIES: CT Angio Chest Pulmonary Embolism (PE) W or WO Contrast Result Date: 10/20/2024 CLINICAL DATA:  Shortness of breath, history of pulmonary embolus EXAM: CT ANGIOGRAPHY CHEST WITH CONTRAST TECHNIQUE: Multidetector CT imaging of the chest was performed using the standard protocol during bolus administration of intravenous contrast. Multiplanar CT image reconstructions and MIPs were obtained to evaluate the vascular anatomy. RADIATION DOSE REDUCTION: This exam was performed according to the departmental dose-optimization program which includes automated exposure control, adjustment of the mA and/or kV according to patient size and/or use of iterative reconstruction technique. CONTRAST:  75mL OMNIPAQUE  IOHEXOL  350 MG/ML SOLN COMPARISON:  July 27, 2024 FINDINGS: Cardiovascular: Satisfactory opacification of the pulmonary arteries to the segmental level. No evidence of pulmonary embolism. Normal heart size. No pericardial effusion. Mediastinum/Nodes: 4 cm right thyroid  nodule is noted. No adenopathy. Esophagus is unremarkable. Lungs/Pleura: Lungs are clear. No pleural effusion or pneumothorax. Upper Abdomen: No acute abnormality. Musculoskeletal: No chest wall abnormality. No acute or significant osseous findings. Review of the MIP images confirms the above  findings. IMPRESSION: 1. No definite evidence of pulmonary embolus. 2. 4 cm right thyroid  nodule is noted. Recommend thyroid  US . (ref: J Am Coll Radiol. 2015 Feb;12(2): 143-50). Electronically Signed   By: Lynwood Landy Raddle M.D.   On: 10/20/2024 13:35     ASSESSMENT & PLAN XOLANI DEGRACIA is a 68 y.o. female with medical history significant for DVT/PET who presents for  a follow up visit.    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  --recommend the patient continue to hold eliquis  5mg  BID in the setting of suspected GI bleed --Patient currently has IVC filter in place.  Put in place during hospitalization August 2025 --Labs today show WBC 16.6, hemoglobin 11.7, platelets 318, creatinine mildly elevated at 1.24, LFTs normal. -- Due to worsening shortness of breath and some chest discomfort, patient underwent CTA chest today to rule out PE or other acute cardiopulmonary abnormality.  Findings showed no evidence of pulmonary embolism or acute changes. Will send referral to pulmonology to further evaluate shortness of breath.  --Dr. Federico does not recommend reinitiating anticoagulation at this time.  Continue with IVC filter and monitor closely for signs and/or symptoms of recurrent VTE --RTC in 3 months with labs/follow up.   #Leukocytosis: -- Etiology unknown but differentials include inflammatory process, medication induced and/or infectious process. --Patient received steroid injection on both knees on 10/18/2024.  -- Patient denies any infectious symptoms including fevers, cough, urinary symptoms or diarrhea. -- Advised to monitor closely for fevers or other infectious symptoms. -- Patient will recheck labs at next visit.  # Acute Anemia # Suspected GI Bleeding -- Patient was previously admitted due to dark tarry stools and precipitous drop in hemoglobin.   Endoscopic evaluation did not reveal source of bleeding. --Iron  panel shows no evidence of deficiency.  No need for IV iron  at this time. --Strict return precautions for any signs or symptoms concerning for worsening bleed. --Will perform monthly labs to continue monitoring closely.   #Right thyroid  mass: --Under the care of [facility] team, last seen by Dr. Murry in April 2025.  --FNA of right thyroid  mass from January 2021 was consistent with benign follicular nodule --Seen again on CTA from today.    Orders Placed This Encounter  Procedures   CT Angio Chest Pulmonary Embolism (PE) W or WO Contrast    Standing Status:   Future    Number of Occurrences:   1    Expected Date:   10/20/2024    Expiration Date:   10/20/2025    If indicated for the ordered procedure, I authorize the administration of contrast media per Radiology protocol:   Yes    Does the patient have a contrast media/X-ray dye allergy?:   No    Preferred imaging location?:   [facility]    All questions were answered. The patient knows to call the clinic with any problems, questions or concerns.  I have spent a total of 30 minutes minutes of face-to-face and non-face-to-face time, preparing to see the patient,  performing a medically appropriate examination, counseling and educating the patient, ordering tests/procedures,  documenting clinical information in the electronic health record, independently interpreting results and communicating results to the patient, and care coordination.    [name] Police PA-C Dept of Hematology and Oncology [facility] Phone: [phone]   10/20/2024 4:34 PM

## 2024-10-20 ENCOUNTER — Encounter (HOSPITAL_COMMUNITY)
Admission: RE | Admit: 2024-10-20 | Discharge: 2024-10-20 | Disposition: A | Discharge status: Home / Self Care | Source: Ambulatory Visit | Attending: Physician Assistant | Admitting: Physician Assistant

## 2024-10-20 ENCOUNTER — Ambulatory Visit: Admitting: Physician Assistant

## 2024-10-20 ENCOUNTER — Telehealth: Payer: Self-pay

## 2024-10-20 ENCOUNTER — Inpatient Hospital Stay: Attending: Hematology and Oncology

## 2024-10-20 VITALS — BP 139/87 | HR 67 | Temp 97.2°F | Resp 18 | Wt 174.0 lb

## 2024-10-20 DIAGNOSIS — D508 Other iron deficiency anemias: Secondary | ICD-10-CM | POA: Diagnosis not present

## 2024-10-20 DIAGNOSIS — Z86718 Personal history of other venous thrombosis and embolism: Secondary | ICD-10-CM | POA: Insufficient documentation

## 2024-10-20 DIAGNOSIS — D649 Anemia, unspecified: Secondary | ICD-10-CM | POA: Diagnosis present

## 2024-10-20 DIAGNOSIS — Z803 Family history of malignant neoplasm of breast: Secondary | ICD-10-CM | POA: Insufficient documentation

## 2024-10-20 DIAGNOSIS — Z808 Family history of malignant neoplasm of other organs or systems: Secondary | ICD-10-CM | POA: Diagnosis not present

## 2024-10-20 DIAGNOSIS — I2699 Other pulmonary embolism without acute cor pulmonale: Secondary | ICD-10-CM

## 2024-10-20 DIAGNOSIS — R0602 Shortness of breath: Secondary | ICD-10-CM | POA: Diagnosis not present

## 2024-10-20 DIAGNOSIS — Z800 Family history of malignant neoplasm of digestive organs: Secondary | ICD-10-CM | POA: Insufficient documentation

## 2024-10-20 DIAGNOSIS — Z86711 Personal history of pulmonary embolism: Secondary | ICD-10-CM | POA: Insufficient documentation

## 2024-10-20 DIAGNOSIS — Z79899 Other long term (current) drug therapy: Secondary | ICD-10-CM | POA: Insufficient documentation

## 2024-10-20 DIAGNOSIS — Z7901 Long term (current) use of anticoagulants: Secondary | ICD-10-CM | POA: Insufficient documentation

## 2024-10-20 LAB — CBC WITH DIFFERENTIAL (CANCER CENTER ONLY)
Abs Immature Granulocytes: 0.06 K/uL (ref 0.00–0.07)
Basophils Absolute: 0 K/uL (ref 0.0–0.1)
Basophils Relative: 0 %
Eosinophils Absolute: 0 K/uL (ref 0.0–0.5)
Eosinophils Relative: 0 %
HCT: 33.7 % — ABNORMAL LOW (ref 36.0–46.0)
Hemoglobin: 11.7 g/dL — ABNORMAL LOW (ref 12.0–15.0)
Immature Granulocytes: 0 %
Lymphocytes Relative: 7 %
Lymphs Abs: 1.2 K/uL (ref 0.7–4.0)
MCH: 29.1 pg (ref 26.0–34.0)
MCHC: 34.7 g/dL (ref 30.0–36.0)
MCV: 83.8 fL (ref 80.0–100.0)
Monocytes Absolute: 0.6 K/uL (ref 0.1–1.0)
Monocytes Relative: 4 %
Neutro Abs: 14.8 K/uL — ABNORMAL HIGH (ref 1.7–7.7)
Neutrophils Relative %: 89 %
Platelet Count: 318 K/uL (ref 150–400)
RBC: 4.02 MIL/uL (ref 3.87–5.11)
RDW: 14.1 % (ref 11.5–15.5)
WBC Count: 16.6 K/uL — ABNORMAL HIGH (ref 4.0–10.5)
nRBC: 0 % (ref 0.0–0.2)

## 2024-10-20 LAB — CMP (CANCER CENTER ONLY)
ALT: 16 U/L (ref 0–44)
AST: 29 U/L (ref 15–41)
Albumin: 4.2 g/dL (ref 3.5–5.0)
Alkaline Phosphatase: 80 U/L (ref 38–126)
Anion gap: 10 (ref 5–15)
BUN: 26 mg/dL — ABNORMAL HIGH (ref 8–23)
CO2: 23 mmol/L (ref 22–32)
Calcium: 9.7 mg/dL (ref 8.9–10.3)
Chloride: 103 mmol/L (ref 98–111)
Creatinine: 1.24 mg/dL — ABNORMAL HIGH (ref 0.44–1.00)
GFR, Estimated: 47 mL/min — ABNORMAL LOW (ref >60)
Glucose, Bld: 97 mg/dL (ref 70–99)
Potassium: 4.1 mmol/L (ref 3.5–5.1)
Sodium: 136 mmol/L (ref 135–145)
Total Bilirubin: 0.4 mg/dL (ref 0.0–1.2)
Total Protein: 7.5 g/dL (ref 6.5–8.1)

## 2024-10-20 LAB — IRON AND IRON BINDING CAPACITY (CC-WL,HP ONLY)
Iron: 63 ug/dL (ref 28–170)
Saturation Ratios: 16 % (ref 10.4–31.8)
TIBC: 382 ug/dL (ref 250–450)
UIBC: 320 ug/dL

## 2024-10-20 LAB — FERRITIN: Ferritin: 105 ng/mL (ref 11–307)

## 2024-10-20 MED ORDER — SODIUM CHLORIDE (PF) 0.9 % IJ SOLN
INTRAMUSCULAR | Status: AC
  Filled 2024-10-20: qty 50

## 2024-10-20 MED ORDER — IOHEXOL 350 MG/ML SOLN
80.0000 mL | Freq: Once | INTRAVENOUS | Status: AC | PRN
  Administered 2024-10-20: 75 mL via INTRAVENOUS

## 2024-10-20 NOTE — Telephone Encounter (Signed)
 Will be awaiting fax to give to Dr. Joshua

## 2024-10-20 NOTE — Telephone Encounter (Signed)
 Copied from CRM #[id number]. Topic: Clinical - Medication Question >> Oct 20, 2024  9:54 AM Tinnie BROCKS wrote: Reason for CRM: Sam with [facility] calling to let us  know that morphine  and alprazolam  are not recommended to be taken together due to sedation and respiratory depression and suggest stopping or reducing these medications if not already. Will be faxing us  this information. Best cb# [phone]

## 2024-10-22 ENCOUNTER — Ambulatory Visit: Payer: Self-pay | Admitting: Physician Assistant

## 2024-10-23 ENCOUNTER — Telehealth: Payer: Self-pay | Admitting: Hematology and Oncology

## 2024-10-23 NOTE — Telephone Encounter (Signed)
 Left the patient a voicemail with the scheduled appointment details per LOS

## 2024-10-25 ENCOUNTER — Ambulatory Visit: Admitting: Gastroenterology

## 2024-11-03 ENCOUNTER — Telehealth (INDEPENDENT_AMBULATORY_CARE_PROVIDER_SITE_OTHER): Payer: Self-pay

## 2024-11-03 NOTE — Telephone Encounter (Signed)
 Called patient to let them know we need to reschedule their appointment with Dr. Greggory I left a voicemail explaining and left call back number.

## 2024-11-06 NOTE — Telephone Encounter (Signed)
 Called patient again to get appointment rescheduled

## 2024-11-08 ENCOUNTER — Telehealth (INDEPENDENT_AMBULATORY_CARE_PROVIDER_SITE_OTHER): Payer: Self-pay

## 2024-11-08 ENCOUNTER — Encounter (INDEPENDENT_AMBULATORY_CARE_PROVIDER_SITE_OTHER): Payer: Self-pay

## 2024-11-08 NOTE — Telephone Encounter (Signed)
 Left voice message and sent MyChart message for patient to call to reschedule 12/04/2024 appointment - Dr Greggory not in office.

## 2024-11-10 ENCOUNTER — Telehealth: Payer: Self-pay

## 2024-11-10 NOTE — Telephone Encounter (Signed)
 [employer]  benefits ran for bilateral knee case ID [id number]

## 2024-11-17 NOTE — Telephone Encounter (Signed)
 Please schedule patient when medication is stocked  [place]  authorized for bilateral knee NO PRE CERT REQUIRED  Patient responsible for 20% coinsurance Copay $35 Deductible does not apply OOP MAX $4200 has met $0 Once OOP has been met coverage goes to 100% and copay will no longer apply Reference # [id number]

## 2024-11-20 NOTE — Telephone Encounter (Signed)
 Holding until needed by patient.

## 2024-11-24 NOTE — Telephone Encounter (Signed)
See other telephone encounter with details.

## 2024-11-29 ENCOUNTER — Ambulatory Visit: Admitting: Pulmonary Disease

## 2024-12-04 ENCOUNTER — Institutional Professional Consult (permissible substitution) (INDEPENDENT_AMBULATORY_CARE_PROVIDER_SITE_OTHER)

## 2024-12-05 ENCOUNTER — Ambulatory Visit: Admitting: Gastroenterology

## 2024-12-18 ENCOUNTER — Encounter: Admitting: Internal Medicine

## 2024-12-21 ENCOUNTER — Ambulatory Visit: Admitting: Pulmonary Disease

## 2025-01-22 ENCOUNTER — Inpatient Hospital Stay

## 2025-01-22 ENCOUNTER — Inpatient Hospital Stay: Admitting: Hematology and Oncology

## 2025-03-08 ENCOUNTER — Encounter: Admitting: Internal Medicine

## 2025-04-18 ENCOUNTER — Ambulatory Visit
